# Patient Record
Sex: Male | Born: 1945 | Race: Black or African American | Hispanic: No | Smoking: Former smoker
Health system: Southern US, Community
[De-identification: ages and names within clinical notes are randomized; demographics above are authoritative.]

## PROBLEM LIST (undated history)

## (undated) DIAGNOSIS — E785 Hyperlipidemia, unspecified: Secondary | ICD-10-CM

## (undated) DIAGNOSIS — C61 Malignant neoplasm of prostate: Secondary | ICD-10-CM

## (undated) DIAGNOSIS — G2581 Restless legs syndrome: Secondary | ICD-10-CM

## (undated) DIAGNOSIS — I10 Essential (primary) hypertension: Secondary | ICD-10-CM

## (undated) HISTORY — DX: Malignant neoplasm of prostate: C61

## (undated) HISTORY — DX: Hyperlipidemia, unspecified: E78.5

## (undated) HISTORY — DX: Essential (primary) hypertension: I10

## (undated) HISTORY — DX: Restless legs syndrome: G25.81

## (undated) HISTORY — PX: PELVIC LYMPH NODE DISSECTION: SHX6543

---

## 1998-03-05 ENCOUNTER — Ambulatory Visit (HOSPITAL_COMMUNITY): Admission: RE | Admit: 1998-03-05 | Discharge: 1998-03-05 | Payer: Self-pay | Admitting: Nephrology

## 1998-05-02 ENCOUNTER — Encounter: Payer: Self-pay | Admitting: Emergency Medicine

## 1998-05-02 ENCOUNTER — Emergency Department (HOSPITAL_COMMUNITY): Admission: EM | Admit: 1998-05-02 | Discharge: 1998-05-02 | Payer: Self-pay | Admitting: Emergency Medicine

## 2000-02-21 ENCOUNTER — Other Ambulatory Visit: Admission: RE | Admit: 2000-02-21 | Discharge: 2000-02-21 | Payer: Self-pay | Admitting: Urology

## 2000-02-21 ENCOUNTER — Encounter (INDEPENDENT_AMBULATORY_CARE_PROVIDER_SITE_OTHER): Payer: Self-pay

## 2000-03-06 ENCOUNTER — Encounter: Payer: Self-pay | Admitting: Urology

## 2000-03-06 ENCOUNTER — Encounter: Admission: RE | Admit: 2000-03-06 | Discharge: 2000-03-06 | Payer: Self-pay | Admitting: Urology

## 2000-04-03 ENCOUNTER — Encounter: Payer: Self-pay | Admitting: Urology

## 2000-04-07 ENCOUNTER — Encounter (INDEPENDENT_AMBULATORY_CARE_PROVIDER_SITE_OTHER): Payer: Self-pay

## 2000-04-07 ENCOUNTER — Inpatient Hospital Stay (HOSPITAL_COMMUNITY): Admission: RE | Admit: 2000-04-07 | Discharge: 2000-04-09 | Payer: Self-pay | Admitting: Urology

## 2005-06-24 ENCOUNTER — Ambulatory Visit (HOSPITAL_COMMUNITY): Admission: RE | Admit: 2005-06-24 | Discharge: 2005-06-24 | Payer: Self-pay | Admitting: Urology

## 2005-10-22 ENCOUNTER — Encounter: Payer: Self-pay | Admitting: Nephrology

## 2005-11-06 ENCOUNTER — Encounter (HOSPITAL_COMMUNITY): Admission: RE | Admit: 2005-11-06 | Discharge: 2006-02-04 | Payer: Self-pay | Admitting: Urology

## 2006-07-10 ENCOUNTER — Ambulatory Visit (HOSPITAL_COMMUNITY): Admission: RE | Admit: 2006-07-10 | Discharge: 2006-07-10 | Payer: Self-pay | Admitting: Urology

## 2008-03-30 ENCOUNTER — Ambulatory Visit (HOSPITAL_COMMUNITY): Admission: RE | Admit: 2008-03-30 | Discharge: 2008-03-30 | Payer: Self-pay | Admitting: Urology

## 2010-11-08 NOTE — H&P (Signed)
[facility]  Patient:    [name] Mathews, [name] Mathews                        MRN: [id number] Adm. Date:  [id number] Attending:  Lindaann Slough                         History and Physical  CHIEF COMPLAINT:  Carcinoma of prostate.  HISTORY OF PRESENT ILLNESS:  Patient is a 65 year old male who was found by his family physician to have a PSA at 46.49.  Rectal examination showed a nodule on the left lobe of the prostate at the apex.  A biopsy of the prostate is positive for adenocarcinoma, Gleason score 9.  Bone scan is negative for metastatic disease.  Treatment options were discussed with the patient: Radiation therapy versus radical prostatectomy.  It was felt that a better chance of cure would be with the radical prostatectomy and the patient has chosen to have surgery.  PAST MEDICAL HISTORY:  Patient has a history of kidney stones.  He denies hypertension or diabetes.  FAMILY HISTORY:  His mother died at age 18 of a heart attack.  His father is 6 and has high blood pressure.  Patient has two brothers living and two brothers died, one in [place] and one died of unknown causes to him.  He also has two sisters living.  One sister died of cerebral aneurysm.  He has two children and he is a widower; his wife died of breast cancer at the age of 85.  ALLERGIES:  No known drug allergies.  MEDICATIONS:  None.  REVIEW OF SYSTEMS:  No cough.  No shortness of breath.  No hemoptysis. CARDIOVASCULAR:  No palpitations or chest pain.  GI:  No nausea, no vomiting, no diarrhea or constipation.  GU:  As per history.  PHYSICAL EXAMINATION  GENERAL:  This is a well-developed 65 year old male in no acute distress.  VITAL SIGNS:  His blood pressure is 146/94, pulse 57, respirations 14, temperature 97.7.  HEENT:  His head is normal.  Pupils are equal and reactive to light and accommodation.  Ears, nose and throat within normal limits.  NECK:  Supple.  No cervical lymph nodes  or thyromegaly.  CHEST:  Symmetrical.  LUNGS:  Fully expanded and clear to percussion and auscultation.  HEART:  Regular rhythm.  No murmurs.  No gallops.  ABDOMEN:  Soft, nondistended, nontender.  Liver, spleen and kidneys not palpable. No organomegaly.  Bowel sounds normal.  GENITALIA:  Penis is uncircumcised.  The meatus is normal.  The scrotum is unremarkable.  The testicles, cords and epididymes are all normal.  RECTAL:  The sphincter tone is normal.  The prostate is enlarged and there is a nodule at the left apex.  ADMITTING DIAGNOSIS:  Adenocarcinoma of prostate. DD:  04/07/00 TD:  04/07/00 Job: [id number]

## 2010-11-08 NOTE — Op Note (Signed)
[facility]  Patient:    [name] Mathews, [name] Mathews                        MRN: [id number] Proc. Date: 04/07/00 Adm. Date:  [id number] Attending:  Lindaann Slough CC:         Jarome Matin, M.D.  Jamison Neighbor, M.D.   Operative Report  PREOPERATIVE DIAGNOSIS:  Adenocarcinoma of prostate.  POSTOPERATIVE DIAGNOSIS:  Adenocarcinoma of prostate.  PROCEDURE:  Bilateral pelvic lymphadenectomy.  SURGEON:  Lindaann Slough, M.D.  ASSISTANT:  Jamison Neighbor, M.D.  ANESTHESIA:  General.  INDICATIONS:  The patient is a 65 year old male who was diagnosed to have adenocarcinoma of prostate by biopsy.  His Gleason score if 9.  His PSA is 46.49.  Treatment options were discussed with the patient: radical prostatectomy versus radiation therapy.  He chose to have radical prostatectomy.  He understands that his prognosis depends on the stage, PSA, and Gleason score.  He is willing to proceed with the procedure.  She is scheduled for bilateral pelvic lymphadenectomy and radical prostatectomy.  DESCRIPTION OF PROCEDURE:  Under general anesthesia, the patient was prepped and draped and placed in the supine position.  A longitudinal incision was made in the suprapubic area.  The incision was carried down to the rectus fascia which was then incised.  The recti muscles were split in the midline, and both iliac fossae were entered.  Bilateral pelvic lymphadenectomy was done using the pelvic side walls, the obturator nerve, bilateral side of the bladder, the iliac vessels as landmarks.  Both right and left obturator nodes were sent for frozen section.  While waiting for the pathology report, the endopelvic fascia was incised from the apex to the base of the prostate, and the dorsal vein complex was doubly ligated with #0 Vicryl.  The pathology report on the frozen section showed there were no positive nodes on the right side but 2 out of 3 nodes on the left side were  positive.  After discussion with him and family and explaining all the risks of the radical prostatectomy versus hormonal manipulation and, even with radical prostatectomy, it is not possible to cure the patient, it was then decided not to do the radical prostatectomy.  Hemostasis was then completed with electrocautery.  The fascia was then closed with #0 PDS, and the skin was closed with skin staples.  Needle, sponge, and instrument counts were correct on two occasions.  Estimated blood loss minimal.  The patient tolerated the procedure well and left the OR in satisfactory condition to postanesthesia care unit. DD:  04/07/00 TD:  04/07/00 Job: [id number]

## 2010-11-08 NOTE — Discharge Summary (Signed)
[facility]  Patient:    [name] Mathews, [name] Mathews                        MRN: [id number] Adm. Date:  [id number] Disc. Date: 04/09/00 Attending:  Lindaann Slough CC:         Jarome Matin, M.D.   Discharge Summary  DISCHARGE DIAGNOSES:  Adenocarcinoma of the prostate.  PROCEDURE:  Bilateral pelvic lymphadenectomy on April 07, 2000.  The patient is a 65 year old male who was found by his family physician to have a PSA of 46.49. Rectal examination showed a nodule in the left lobe of the prostate and the apex. A biopsy of the prostate is positive for adenocarcinoma, Gleason score 9. Bone scan is negative for metastatic disease. The patient was admitted on April 07, 2000 for bilateral pelvic lymphadenectomy and radical prostatectomy. A frozen section of the pelvic nodes was positive for metastatic adenocarcinoma. Therefore a radical prostatectomy was not done. The patient was admitted after the procedure.  PHYSICAL EXAMINATION:  VITAL SIGNS:  Blood pressure 146/94, pulse 57, respirations 14, temperature 97.7.  HEENT:  His head is normal. Pupils are equal and reactive to light and accommodatingly. Ears, nose and throat within normal limits.  NECK:  Supple. No cervical lymph nodes and no thyromegaly.  CHEST:  Symmetrical.  LUNGS:  Fully expanded and clear to auscultation and percussion.  HEART:  Regular rhythm, no murmurs, rubs or gallops.  ABDOMEN:  Soft, nondistended, nontender. Liver, spleen and kidneys not palpable. No organomegaly. Bowel sounds are normal.  GENITALIA:  Penis is uncircumcised. The meatus is normal. The scrotum is unremarkable. Testicles, cords and epididymis are all normal.  RECTAL:  The sphincter tone is normal. Prostate is enlarged with a nodule at the left apex.  LABORATORY DATA:  Hemoglobin on admission is 14.8, hematocrit 43.4 and wbc 6.4. PT and PTT within normal limits. Sodium 146, potassium 4.5, BUN 10, creatinine  1.1, calcium is elevated at 10.8. Urinalysis is normal and urine is sterile.  Chest x-ray showed mild right lower lobe atelectasis. His EKG is normal.  The patient had a bilateral pelvic lymphadenectomy done on April 07, 2000. Two out of four nodes were positive. It was then decided not to proceed with radical prostatectomy. The patients postop course was uneventful. He remained afebrile. The Foley catheter was draining clear urine. He was started on a liquid diet which he tolerated well. His diet was gradually advanced. The Foley catheter was removed on April 09, 2000. After removing the Foley, he was voiding well. His urine was clear. His abdomen is soft, nondistended, nontender. He was then discharged home on Cipro 250 mg twice a day and Percocet 5/325 one or two tablets q. 4h p.r.n. for pain.  CONDITION ON DISCHARGE:  Improved.  DISCHARGE DIET:  Regular.  DISCHARGE INSTRUCTIONS:  The patient is instructed not to do any lifting, straining, or driving until further notice. The patient will be treated as an outpatient with Lupron. DD:  04/09/00 TD:  04/09/00 Job: [id number]

## 2011-07-14 DIAGNOSIS — C61 Malignant neoplasm of prostate: Secondary | ICD-10-CM | POA: Diagnosis not present

## 2011-07-22 DIAGNOSIS — E669 Obesity, unspecified: Secondary | ICD-10-CM | POA: Diagnosis not present

## 2011-07-22 DIAGNOSIS — I10 Essential (primary) hypertension: Secondary | ICD-10-CM | POA: Diagnosis not present

## 2011-07-22 DIAGNOSIS — C61 Malignant neoplasm of prostate: Secondary | ICD-10-CM | POA: Diagnosis not present

## 2011-07-22 DIAGNOSIS — E7800 Pure hypercholesterolemia, unspecified: Secondary | ICD-10-CM | POA: Diagnosis not present

## 2011-08-14 DIAGNOSIS — C61 Malignant neoplasm of prostate: Secondary | ICD-10-CM | POA: Diagnosis not present

## 2011-08-15 ENCOUNTER — Other Ambulatory Visit (HOSPITAL_COMMUNITY): Payer: Self-pay | Admitting: Urology

## 2011-08-15 DIAGNOSIS — C61 Malignant neoplasm of prostate: Secondary | ICD-10-CM

## 2011-09-08 ENCOUNTER — Encounter (HOSPITAL_COMMUNITY)
Admission: RE | Admit: 2011-09-08 | Discharge: 2011-09-08 | Disposition: A | Discharge status: Home / Self Care | Payer: Medicare Other | Source: Ambulatory Visit | Attending: Urology | Admitting: Urology

## 2011-09-08 DIAGNOSIS — M545 Low back pain, unspecified: Secondary | ICD-10-CM | POA: Diagnosis not present

## 2011-09-08 DIAGNOSIS — C7951 Secondary malignant neoplasm of bone: Secondary | ICD-10-CM | POA: Insufficient documentation

## 2011-09-08 DIAGNOSIS — C7952 Secondary malignant neoplasm of bone marrow: Secondary | ICD-10-CM | POA: Insufficient documentation

## 2011-09-08 DIAGNOSIS — N2889 Other specified disorders of kidney and ureter: Secondary | ICD-10-CM | POA: Diagnosis not present

## 2011-09-08 DIAGNOSIS — C61 Malignant neoplasm of prostate: Secondary | ICD-10-CM | POA: Insufficient documentation

## 2011-09-08 DIAGNOSIS — R972 Elevated prostate specific antigen [PSA]: Secondary | ICD-10-CM | POA: Diagnosis not present

## 2011-09-08 MED ORDER — TECHNETIUM TC 99M MEDRONATE IV KIT
25.0000 | PACK | Freq: Once | INTRAVENOUS | Status: AC | PRN
  Administered 2011-09-08: 25 via INTRAVENOUS

## 2011-09-15 DIAGNOSIS — C61 Malignant neoplasm of prostate: Secondary | ICD-10-CM | POA: Diagnosis not present

## 2011-09-15 DIAGNOSIS — N529 Male erectile dysfunction, unspecified: Secondary | ICD-10-CM | POA: Diagnosis not present

## 2011-09-17 DIAGNOSIS — G2589 Other specified extrapyramidal and movement disorders: Secondary | ICD-10-CM | POA: Diagnosis not present

## 2011-09-17 DIAGNOSIS — G2581 Restless legs syndrome: Secondary | ICD-10-CM | POA: Diagnosis not present

## 2011-09-17 DIAGNOSIS — G609 Hereditary and idiopathic neuropathy, unspecified: Secondary | ICD-10-CM | POA: Diagnosis not present

## 2011-09-17 DIAGNOSIS — IMO0002 Reserved for concepts with insufficient information to code with codable children: Secondary | ICD-10-CM | POA: Diagnosis not present

## 2011-09-18 ENCOUNTER — Telehealth: Payer: Self-pay | Admitting: Oncology

## 2011-09-18 NOTE — Telephone Encounter (Signed)
S/w pt re appt for 4/5 @ 1:30 pm with FS.

## 2011-09-19 ENCOUNTER — Telehealth: Payer: Self-pay | Admitting: Oncology

## 2011-09-19 NOTE — Telephone Encounter (Signed)
Referred by Dr. Nesi Dx- Prostate Ca °

## 2011-09-21 ENCOUNTER — Other Ambulatory Visit: Payer: Self-pay | Admitting: Oncology

## 2011-09-21 DIAGNOSIS — C61 Malignant neoplasm of prostate: Secondary | ICD-10-CM

## 2011-09-26 ENCOUNTER — Encounter: Payer: Self-pay | Admitting: Oncology

## 2011-09-26 ENCOUNTER — Telehealth: Payer: Self-pay | Admitting: *Deleted

## 2011-09-26 ENCOUNTER — Other Ambulatory Visit (HOSPITAL_BASED_OUTPATIENT_CLINIC_OR_DEPARTMENT_OTHER): Payer: Medicare Other | Admitting: Lab

## 2011-09-26 ENCOUNTER — Ambulatory Visit (HOSPITAL_BASED_OUTPATIENT_CLINIC_OR_DEPARTMENT_OTHER): Payer: Medicare Other | Admitting: Oncology

## 2011-09-26 ENCOUNTER — Ambulatory Visit: Payer: Medicare Other

## 2011-09-26 VITALS — BP 144/84 | HR 64 | Temp 98.1°F | Ht 62.5 in | Wt 178.0 lb

## 2011-09-26 DIAGNOSIS — C61 Malignant neoplasm of prostate: Secondary | ICD-10-CM

## 2011-09-26 DIAGNOSIS — C7951 Secondary malignant neoplasm of bone: Secondary | ICD-10-CM | POA: Diagnosis not present

## 2011-09-26 LAB — CBC WITH DIFFERENTIAL/PLATELET
EOS%: 0.7 % (ref 0.0–7.0)
Eosinophils Absolute: 0.1 10*3/uL (ref 0.0–0.5)
LYMPH%: 30.9 % (ref 14.0–49.0)
MONO#: 0.7 10*3/uL (ref 0.1–0.9)
WBC: 9.3 10*3/uL (ref 4.0–10.3)

## 2011-09-26 LAB — COMPREHENSIVE METABOLIC PANEL
Albumin: 4 g/dL (ref 3.5–5.2)
BUN: 12 mg/dL (ref 6–23)
Calcium: 9.7 mg/dL (ref 8.4–10.5)
Chloride: 106 mEq/L (ref 96–112)
Glucose, Bld: 105 mg/dL — ABNORMAL HIGH (ref 70–99)
Total Bilirubin: 0.3 mg/dL (ref 0.3–1.2)

## 2011-09-26 MED ORDER — PREDNISONE 5 MG PO TABS: 5.0000 mg | ORAL_TABLET | Freq: Two times a day (BID) | ORAL | Status: AC

## 2011-09-26 MED ORDER — ABIRATERONE ACETATE 250 MG PO TABS: 1000.0000 mg | ORAL_TABLET | Freq: Every day | ORAL | Status: DC

## 2011-09-26 NOTE — Progress Notes (Signed)
CC:   Lindaann Slough, M.D.  REASON FOR CONSULTATION:  Prostate cancer.  HISTORY OF PRESENT ILLNESS:  [name] Mathews is a pleasant gentleman of [place], [place], who has lived in [place] for a while but he is currently back in [place].  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  He is a relatively healthy gentleman without any significant history of diabetes or coronary disease.  He does have history of hypertension that has been reasonably controlled.  His history of prostate cancer dates back to 2001 where he had presented with an abnormal digital rectal examination, and his evaluation included a prostate biopsy that was done in August of 2001 and showed that he had adenocarcinoma of the prostate, Gleason score 4 plus 5 equals 9.  That is case number [id number].  He underwent an attempted prostatectomy and found to have a left pelvic enlargement that was biopsy proven to be metastatic adenocarcinoma involving 2-3 left pelvic lymph nodes.  He was started on hormone therapy, initially with Lupron and subsequently Casodex was added in 2009, and he was switched to [place] at one point.  However, most recently he was noted to start having increase in his PSA despite near castrate levels of testosterone.  His last PSA was up to 16, previously was 10.9 in October of 2012 with a testosterone of 33.  Previous to that in June of 2012 his PSA was 7.5, indicating a doubling time slightly over 6 months.  As mentioned, he had been recently restarted on Lupron after he had been on Firmagon previously.  His recent bone scan done 09/08/2011 showed that there was new multifocal osseous activity consistent with metastatic disease within the skull, occipital right parietal regions.  There is a lesion in the right scapular glenoid.  Several rib lesions.  Spinal activity is more  pronounced on the thoracic spine.  Probable metastasis in the right pubic ramus.  With all of that the patient remained asymptomatic actually.  He had not had any back pain, not had any shoulder pain, had not had any major changes in his performance status. He has continued to have excellent quality of life.  He has continued to golf regularly and really had not had any major changes in his performance status and not had any constitutional symptoms.  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  REVIEW OF SYSTEMS:  Not reporting headaches, blurry vision, double vision.  Not reporting any motor or sensory neuropathy.  Not reporting any alteration in mental status.  Not reporting any psychiatric issues, depression.  Not reporting any fever, chills, sweats.  Not reporting any cough, hemoptysis, hematemesis.  No nausea, vomiting, abdominal pain. No hematochezia or melena.  Rest of review of systems unremarkable.  PAST MEDICAL HISTORY:  Significant for hypertension, history of hyperlipidemia, restless legs syndrome and history of prostate cancer.  MEDICATIONS:  He is on Requip, Lipitor, Casodex, aspirin, VESIcare, Fish Oil.  He is on Lupron injections as mentioned.  ALLERGIES:  Percocet.  SOCIAL HISTORY:  He is married for the second time.  He has number 2 children.  Denied any alcohol or tobacco abuse.  FAMILY HISTORY:  Really unremarkable for any prostate cancer.  PHYSICAL EXAMINATION:  General:  Alert, awake gentleman, appeared in no active distress today.  Vital signs:  His blood pressure is 144/84, pulse 64, respirations 20, temperature 98.  Weight 178 pounds.  ECOG performance status is 1.  HEENT:  Head normocephalic, atraumatic. Pupils equal, round, reactive to light.  Oral mucosa moist and pink. Neck:  Supple without adenopathy.  Heart:  Regular rate, S1, S2.  Lungs: Clear to auscultation.  Abdomen:  Soft, nontender.   No hepatosplenomegaly.  Extremities:  No edema.  Neurological:  Intact motor, sensory and deep tendon reflexes.  LABORATORY DATA:  Showed a hemoglobin of 12.7, white count 9.3, platelet count of 247.  ASSESSMENT AND PLAN:  This is a pleasant 66 year old gentleman with the following issues: 1. Advanced prostate cancer.  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     He has an elevated PSA up to 16 in March of 2013 with his most     recent one was 10.95 in October of 2012 as mentioned, with a     doubling time of just about over 6 months.  He has, as I mentioned,     close to castrate level testosterone of 33.  I had a lengthy     discussion today with [name] and his family discussing the     natural course of prostate cancer, more specifically castration     resistant disease and the treatment options were outlined today.  I     talked to him about hormone manipulation with ketoconazole and     prednisone versus more active agent such as Zytiga.  Also talked     about prevention immunotherapy as well as systemic chemotherapy.     At this time, [name] is interested in using Zytiga.  The risks     and benefits of this drug were discussed in detail with toxicity to     include adrenal insufficiency, electrolyte imbalance, liver     function abnormalities, fluid retention, lower extremity edema, was     discussed today in detail.  Also talked to him about using     prednisone with it at 5 mg twice a day.  Complications associated     with hyperglycemia, weight gain, fluid retention, etc. 2. Bone directed therapy.  I talked to him extensively about use of     Xgeva for bony protective purposes as well as the role of the     calcium supplementation.  Also talked to him about complications     associated with that which would include osteonecrosis of the jaw,     hypocalcemia especially in the setting of renal  insufficiency which     I do not think he has at this time, but I am checking both calcium     levels and creatinine levels.  I have also instructed him to     stopped his Casodex to give him antiandrogen withdrawal effect     which I think it will help his cause as well.  All of his questions     were answered today.    ______________________________ Benjiman Core, M.D. FNS/MEDQ  D:  09/26/2011  T:  09/26/2011  Job:  [id number]

## 2011-09-26 NOTE — Progress Notes (Signed)
ZYTIGA RX TO [facility]. 

## 2011-09-26 NOTE — Progress Notes (Signed)
Note dictated

## 2011-09-26 NOTE — Telephone Encounter (Signed)
gve the pt his may 2013 appt calendar °

## 2011-09-29 ENCOUNTER — Telehealth: Payer: Self-pay | Admitting: Oncology

## 2011-10-27 ENCOUNTER — Other Ambulatory Visit: Payer: Self-pay | Admitting: *Deleted

## 2011-10-27 NOTE — Telephone Encounter (Signed)
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 

## 2011-10-28 ENCOUNTER — Other Ambulatory Visit: Payer: Self-pay | Admitting: *Deleted

## 2011-10-28 MED ORDER — ABIRATERONE ACETATE 250 MG PO TABS: 1000.0000 mg | ORAL_TABLET | Freq: Every day | ORAL | Status: DC

## 2011-10-28 NOTE — Telephone Encounter (Signed)
RECEIVED A FAX FROM [facility] CONCERNING A CONFIRMATION OF FACSIMILE RECEIPT. 

## 2011-10-30 ENCOUNTER — Other Ambulatory Visit: Payer: Self-pay | Admitting: *Deleted

## 2011-10-30 NOTE — Telephone Encounter (Signed)
Confirmation that med was shipped on 10/29/11.

## 2011-10-31 ENCOUNTER — Ambulatory Visit (HOSPITAL_BASED_OUTPATIENT_CLINIC_OR_DEPARTMENT_OTHER): Payer: Medicare Other | Admitting: Oncology

## 2011-10-31 ENCOUNTER — Other Ambulatory Visit (HOSPITAL_BASED_OUTPATIENT_CLINIC_OR_DEPARTMENT_OTHER): Payer: Medicare Other | Admitting: Lab

## 2011-10-31 VITALS — BP 111/71 | HR 54 | Temp 97.4°F | Ht 62.5 in | Wt 173.2 lb

## 2011-10-31 DIAGNOSIS — C7952 Secondary malignant neoplasm of bone marrow: Secondary | ICD-10-CM | POA: Diagnosis not present

## 2011-10-31 DIAGNOSIS — C775 Secondary and unspecified malignant neoplasm of intrapelvic lymph nodes: Secondary | ICD-10-CM

## 2011-10-31 DIAGNOSIS — C61 Malignant neoplasm of prostate: Secondary | ICD-10-CM

## 2011-10-31 DIAGNOSIS — D649 Anemia, unspecified: Secondary | ICD-10-CM

## 2011-10-31 DIAGNOSIS — C7951 Secondary malignant neoplasm of bone: Secondary | ICD-10-CM

## 2011-10-31 LAB — CBC WITH DIFFERENTIAL/PLATELET
EOS%: 0.4 % (ref 0.0–7.0)
Eosinophils Absolute: 0.1 10*3/uL (ref 0.0–0.5)
HGB: 12.8 g/dL — ABNORMAL LOW (ref 13.0–17.1)
MCHC: 32 g/dL (ref 32.0–36.0)
MCV: 84.2 fL (ref 79.3–98.0)
MONO%: 5.1 % (ref 0.0–14.0)
NEUT%: 81.3 % — ABNORMAL HIGH (ref 39.0–75.0)
RBC: 4.75 10*6/uL (ref 4.20–5.82)
WBC: 13.5 10*3/uL — ABNORMAL HIGH (ref 4.0–10.3)
lymph#: 1.7 10*3/uL (ref 0.9–3.3)
nRBC: 0 % (ref 0–0)

## 2011-10-31 LAB — COMPREHENSIVE METABOLIC PANEL
AST: 30 U/L (ref 0–37)
CO2: 32 mEq/L (ref 19–32)
Potassium: 4.8 mEq/L (ref 3.5–5.3)
Sodium: 143 mEq/L (ref 135–145)
Total Bilirubin: 0.6 mg/dL (ref 0.3–1.2)

## 2011-10-31 LAB — PSA: PSA: 22.86 ng/mL — ABNORMAL HIGH (ref <=4.00)

## 2011-10-31 MED ORDER — DENOSUMAB 120 MG/1.7ML ~~LOC~~ SOLN
120.0000 mg | Freq: Once | SUBCUTANEOUS | Status: AC
  Administered 2011-10-31: 120 mg via SUBCUTANEOUS
  Filled 2011-10-31: qty 1.7

## 2011-10-31 NOTE — Progress Notes (Signed)
Hematology and Oncology Follow Up Visit  [name] Mathews [id number] [birth year] 66 y.o. 10/31/2011 3:17 PM    Principle Diagnosis: This is a pleasant 66 year old gentleman with  Advanced prostate cancer. He has metastatic disease to the bone. He was inially diagnosed  2001, gleason score 4+5= 9.  Prior Therapy: 1. 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 He was started on hormone therapy, initially with Lupron   2. Due to  A rise in his PSA Casodex was added to firmagon in 2009.  3. Most recently, his PSA was up to 16, with castrate level testosterone and bone involvement based on a bone scan on 08/2101.  Current therapy:  Zytiga 1000 mg po daily started in 09/2011. Xgeva 120 mg to start on 10/31/2011. Lupron given by Dr. Brunilda Payor.   Interim History: 66 year old man presents today for a follow up visit. He started Zytiga last month and has tolerated well. He reports no symptoms at this point. He reports no bone pain, no GU symptoms. He report no Gi symptoms. He has no fluid retention or leg swelling.  had not had any back pain, not had any shoulder pain, had not had any major changes in his performance status. He has continued to have excellent quality of life. He has continued to golf regularly and really had not had any major changes in his performance status and not had any constitutional symptoms.   Medications: I have reviewed the patient's current medications. Current outpatient prescriptions:abiraterone Acetate (ZYTIGA) 250 MG tablet, Take 4 tablets (1,000 mg total) by mouth daily. Take on an empty stomach 1 hour before or 2 hours after a meal, Disp: 120 tablet, Rfl: 1;  aspirin 81 MG tablet, Take 81 mg by mouth daily., Disp: , Rfl: ;  atorvastatin (LIPITOR) 10 MG tablet, Take 10 mg by mouth daily., Disp: , Rfl: ;  bicalutamide (CASODEX) 50 MG tablet,  Take 50 mg by mouth daily., Disp: , Rfl:  fish oil-omega-3 fatty acids 1000 MG capsule, Take 1 g by mouth daily., Disp: , Rfl: ;  rOPINIRole (REQUIP) 2 MG tablet, Take 2 mg by mouth at bedtime., Disp: , Rfl: ;  solifenacin (VESICARE) 5 MG tablet, Take 10 mg by mouth daily., Disp: , Rfl:  Current facility-administered medications:denosumab (XGEVA) injection 120 mg, 120 mg, Subcutaneous, Once, Benjiman Core, MD, 120 mg at 10/31/11 1423  Allergies: No Known Allergies  Past Medical History, Surgical history, Social history, and Family History were reviewed and updated.  Review of Systems: Constitutional:  Negative for fever, chills, night sweats, anorexia, weight loss, pain. Cardiovascular: negative Respiratory: negative Neurological: negative Dermatological: negative ENT: negative Skin: Negative. Gastrointestinal: negative Genito-Urinary: negative Hematological and Lymphatic: negative Breast: negative Musculoskeletal: negative Remaining ROS negative. Physical Exam: Blood pressure 111/71, pulse 54, temperature 97.4 F (36.3 C), temperature source Oral, height 5' 2.5" (1.588 m), weight 173 lb 3.2 oz (78.563 kg). ECOG: 0 General appearance: alert Head: Normocephalic, without obvious abnormality, atraumatic Neck: no adenopathy, no carotid bruit, no JVD, supple, symmetrical, trachea midline and thyroid not enlarged, symmetric, no tenderness/mass/nodules Lymph nodes: Cervical, supraclavicular, and axillary nodes normal. Heart:regular rate and rhythm, S1, S2 normal, no murmur, click, rub or gallop Lung:chest clear, no wheezing, rales, normal symmetric air entry Abdomin: soft, non-tender, without masses or organomegaly EXT:no erythema, induration, or nodules   Lab Results: Lab Results  Component Value  Date   WBC 13.5* 10/31/2011   HGB 12.8* 10/31/2011   HCT 40.0 10/31/2011   MCV 84.2 10/31/2011   PLT 221 10/31/2011     Chemistry      Component Value Date/Time   NA 143 09/26/2011 1316     K 4.5 09/26/2011 1316   CL 106 09/26/2011 1316   CO2 28 09/26/2011 1316   BUN 12 09/26/2011 1316   CREATININE 0.85 09/26/2011 1316      Component Value Date/Time   CALCIUM 9.7 09/26/2011 1316   ALKPHOS 121* 09/26/2011 1316   AST 21 09/26/2011 1316   ALT 10 09/26/2011 1316   BILITOT 0.3 09/26/2011 1316       Impression and Plan: This is a pleasant 66 year old gentleman with the  following issues:  1. 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 He has an elevated PSA up to 16 in March of 2013. After one month of Zytiga, he is doing well. His PSA is pending. The plan is to continue with therapy with the same dose or schedule.   2. Bone directed therapy. He is to start Xgeva for bony protective purposes withcalcium supplementation. Also talked to him about complications associated with that which would include osteonecrosis of the jaw and I continued to educated him about dental hygiene.   3. Androgen deprivation: He is to continue Lupron under the care of Dr. Brunilda Payor.     Eli Hose, MD 5/10/20133:17 PM

## 2011-11-03 ENCOUNTER — Telehealth: Payer: Self-pay | Admitting: Oncology

## 2011-11-03 NOTE — Telephone Encounter (Signed)
S/w pt confirming appt for 6/11. Per last order sent 5/10 - appt w/ml 6/11

## 2011-11-28 ENCOUNTER — Encounter: Payer: Self-pay | Admitting: *Deleted

## 2011-11-28 NOTE — Progress Notes (Signed)
RECEIVED A FAX FROM [facility] CONCERNING A CONFIRMATION OF PRESCRIPTION SHIPMENT. 

## 2011-12-02 ENCOUNTER — Other Ambulatory Visit: Payer: Medicare Other | Admitting: Lab

## 2011-12-02 ENCOUNTER — Encounter: Payer: Medicare Other | Admitting: Oncology

## 2011-12-02 ENCOUNTER — Telehealth: Payer: Self-pay | Admitting: Oncology

## 2011-12-02 NOTE — Telephone Encounter (Signed)
called pt to schedule appts fro today earlier and pt was not able to come. r/s appts to 06/17

## 2011-12-03 NOTE — Progress Notes (Signed)
This encounter was created in error - please disregard.

## 2011-12-08 ENCOUNTER — Telehealth: Payer: Self-pay | Admitting: Oncology

## 2011-12-08 ENCOUNTER — Other Ambulatory Visit (HOSPITAL_BASED_OUTPATIENT_CLINIC_OR_DEPARTMENT_OTHER): Payer: Medicare Other | Admitting: Lab

## 2011-12-08 ENCOUNTER — Ambulatory Visit (HOSPITAL_BASED_OUTPATIENT_CLINIC_OR_DEPARTMENT_OTHER): Payer: Medicare Other | Admitting: Oncology

## 2011-12-08 VITALS — BP 141/84 | HR 49 | Temp 97.7°F | Ht 62.5 in | Wt 178.0 lb

## 2011-12-08 DIAGNOSIS — C7951 Secondary malignant neoplasm of bone: Secondary | ICD-10-CM

## 2011-12-08 DIAGNOSIS — C61 Malignant neoplasm of prostate: Secondary | ICD-10-CM

## 2011-12-08 DIAGNOSIS — C7952 Secondary malignant neoplasm of bone marrow: Secondary | ICD-10-CM

## 2011-12-08 DIAGNOSIS — D649 Anemia, unspecified: Secondary | ICD-10-CM

## 2011-12-08 DIAGNOSIS — E291 Testicular hypofunction: Secondary | ICD-10-CM

## 2011-12-08 LAB — CBC WITH DIFFERENTIAL/PLATELET
Basophils Absolute: 0 10*3/uL (ref 0.0–0.1)
HCT: 35.8 % — ABNORMAL LOW (ref 38.4–49.9)
MCH: 27.1 pg — ABNORMAL LOW (ref 27.2–33.4)
MCHC: 32.5 g/dL (ref 32.0–36.0)
MONO#: 0.8 10*3/uL (ref 0.1–0.9)
MONO%: 6.7 % (ref 0.0–14.0)
NEUT%: 73.4 % (ref 39.0–75.0)
Platelets: 231 10*3/uL (ref 140–400)
WBC: 12.3 10*3/uL — ABNORMAL HIGH (ref 4.0–10.3)

## 2011-12-08 LAB — COMPREHENSIVE METABOLIC PANEL WITH GFR
ALT: 14 U/L (ref 0–53)
AST: 16 U/L (ref 0–37)
Albumin: 3.6 g/dL (ref 3.5–5.2)
Alkaline Phosphatase: 97 U/L (ref 39–117)
BUN: 9 mg/dL (ref 6–23)
CO2: 30 meq/L (ref 19–32)
Calcium: 8.8 mg/dL (ref 8.4–10.5)
Chloride: 106 meq/L (ref 96–112)
Creatinine, Ser: 0.69 mg/dL (ref 0.50–1.35)
Glucose, Bld: 96 mg/dL (ref 70–99)
Potassium: 3.9 meq/L (ref 3.5–5.3)
Sodium: 142 meq/L (ref 135–145)
Total Bilirubin: 0.4 mg/dL (ref 0.3–1.2)
Total Protein: 5.9 g/dL — ABNORMAL LOW (ref 6.0–8.3)

## 2011-12-08 LAB — PSA: PSA: 6.13 ng/mL — ABNORMAL HIGH

## 2011-12-08 MED ORDER — DENOSUMAB 120 MG/1.7ML ~~LOC~~ SOLN
120.0000 mg | Freq: Once | SUBCUTANEOUS | Status: AC
  Administered 2011-12-08: 120 mg via SUBCUTANEOUS
  Filled 2011-12-08: qty 1.7

## 2011-12-08 NOTE — Progress Notes (Signed)
Hematology and Oncology Follow Up Visit  [name] Mathews [id number] 05-11-1946 66 y.o. 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 He was inially diagnosed  2001, gleason score 4+5= 9.  Prior Therapy: 1. 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 He was started on hormone therapy, initially with Lupron   2. Due to  A rise in his PSA Casodex was added to firmagon in 2009.  3. Most recently, his PSA was up to 16, with castrate level testosterone and bone involvement based on a bone scan on 08/2101.  Current therapy:  Zytiga 1000 mg po daily started in 09/2011. Xgeva 120 mg to start on 10/31/2011. Lupron given by Dr. Brunilda Payor.   Interim History: 66 year old man presents today for a follow up visit. On Zytiga and tolerating this well. He reports no symptoms at this point. He reports no bone pain, no GU symptoms. He report no GI symptoms. He has no fluid retention or leg swelling. He has not had not had any back pain, not had any shoulder pain, had not had any major changes in his performance status. He has continued to have excellent quality of life. He has continued to golf regularly and really had not had any major changes in his performance status and not had any constitutional symptoms.   Medications: I have reviewed the patient's current medications. Current outpatient prescriptions:abiraterone Acetate (ZYTIGA) 250 MG tablet, Take 4 tablets (1,000 mg total) by mouth daily. Take on an empty stomach 1 hour before or 2 hours after a meal, Disp: 120 tablet, Rfl: 1;  aspirin 81 MG tablet, Take 81 mg by mouth daily., Disp: , Rfl: ;  atorvastatin (LIPITOR) 10 MG tablet, Take 10 mg by mouth daily., Disp: , Rfl: ;  bicalutamide (CASODEX) 50 MG tablet, Take 50  mg by mouth daily., Disp: , Rfl:  fish oil-omega-3 fatty acids 1000 MG capsule, Take 1 g by mouth daily., Disp: , Rfl: ;  rOPINIRole (REQUIP) 2 MG tablet, Take 2 mg by mouth at bedtime., Disp: , Rfl: ;  solifenacin (VESICARE) 5 MG tablet, Take 10 mg by mouth daily., Disp: , Rfl:  Current facility-administered medications:denosumab (XGEVA) injection 120 mg, 120 mg, Subcutaneous, Once, Benjiman Core, MD, 120 mg at 12/08/11 1411  Allergies: No Known Allergies  Past Medical History, Surgical history, Social history, and Family History were reviewed and updated.  Review of Systems: Constitutional:  Negative for fever, chills, night sweats, anorexia, weight loss, pain. Cardiovascular: negative Respiratory: negative Neurological: negative Dermatological: negative ENT: negative Skin: Negative. Gastrointestinal: negative Genito-Urinary: negative Hematological and Lymphatic: negative Breast: negative Musculoskeletal: negative Remaining ROS negative.  Physical Exam: Blood pressure 141/84, pulse 49, temperature 97.7 F (36.5 C), temperature source Oral, height 5' 2.5" (1.588 m), weight 178 lb (80.74 kg). ECOG: 0 General appearance: alert Head: Normocephalic, without obvious abnormality, atraumatic Neck: no adenopathy, no carotid bruit, no JVD, supple, symmetrical, trachea midline and thyroid not enlarged, symmetric, no tenderness/mass/nodules Lymph nodes: Cervical, supraclavicular, and axillary nodes normal. Heart:regular rate and rhythm, S1, S2 normal, no murmur, click, rub or gallop Lung:chest clear, no wheezing, rales, normal symmetric air entry Abdomen: soft, non-tender, without masses or organomegaly EXT:no erythema, induration, or nodules  Lab Results: Lab Results  Component Value Date  WBC 12.3* 12/08/2011   HGB 11.6* 12/08/2011   HCT 35.8* 12/08/2011   MCV 83.3 12/08/2011   PLT 231 12/08/2011     Chemistry      Component Value Date/Time   NA 143 10/31/2011 1324   K 4.8  10/31/2011 1324   CL 103 10/31/2011 1324   CO2 32 10/31/2011 1324   BUN 18 10/31/2011 1324   CREATININE 0.98 10/31/2011 1324      Component Value Date/Time   CALCIUM 9.5 10/31/2011 1324   ALKPHOS 148* 10/31/2011 1324   AST 30 10/31/2011 1324   ALT 20 10/31/2011 1324   BILITOT 0.6 10/31/2011 1324     Impression and Plan: This is a pleasant 66 year old gentleman with the  following issues:  1. 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 He has an elevated PSA up to 16 in March of 2013. After one month of Zytiga, he is doing well clinically. PSA up slightly last month. PSA in pending today. The plan is to continue with therapy with the same dose or schedule for now.  2. Bone directed therapy. On Xgeva with calcium supplementation. Also talked to him about complications associated with that which would include osteonecrosis of the jaw and I continued to educated him about dental hygiene.   3. Androgen deprivation: He is to continue Lupron under the care of Dr. Brunilda Payor.   4. Follow-up: In 4-5 weeks.    Clenton Pare 6/17/20132:24 PM

## 2011-12-08 NOTE — Telephone Encounter (Signed)
appts made and printed for pt aom °

## 2011-12-18 ENCOUNTER — Other Ambulatory Visit: Payer: Self-pay | Admitting: *Deleted

## 2011-12-18 DIAGNOSIS — IMO0002 Reserved for concepts with insufficient information to code with codable children: Secondary | ICD-10-CM | POA: Diagnosis not present

## 2011-12-18 DIAGNOSIS — G609 Hereditary and idiopathic neuropathy, unspecified: Secondary | ICD-10-CM | POA: Diagnosis not present

## 2011-12-18 DIAGNOSIS — G2589 Other specified extrapyramidal and movement disorders: Secondary | ICD-10-CM | POA: Diagnosis not present

## 2011-12-18 DIAGNOSIS — G2581 Restless legs syndrome: Secondary | ICD-10-CM | POA: Diagnosis not present

## 2011-12-18 MED ORDER — ABIRATERONE ACETATE 250 MG PO TABS: 1000.0000 mg | ORAL_TABLET | Freq: Every day | ORAL | Status: DC

## 2011-12-18 NOTE — Telephone Encounter (Signed)
Faxed refill script  + 1 [facility]ill [facility] for zytiga. [phone]

## 2011-12-19 DIAGNOSIS — C61 Malignant neoplasm of prostate: Secondary | ICD-10-CM | POA: Diagnosis not present

## 2011-12-19 DIAGNOSIS — E7800 Pure hypercholesterolemia, unspecified: Secondary | ICD-10-CM | POA: Diagnosis not present

## 2011-12-19 DIAGNOSIS — K219 Gastro-esophageal reflux disease without esophagitis: Secondary | ICD-10-CM | POA: Diagnosis not present

## 2011-12-19 DIAGNOSIS — R062 Wheezing: Secondary | ICD-10-CM | POA: Diagnosis not present

## 2011-12-19 DIAGNOSIS — I10 Essential (primary) hypertension: Secondary | ICD-10-CM | POA: Diagnosis not present

## 2011-12-19 DIAGNOSIS — N62 Hypertrophy of breast: Secondary | ICD-10-CM | POA: Diagnosis not present

## 2011-12-22 DIAGNOSIS — E782 Mixed hyperlipidemia: Secondary | ICD-10-CM | POA: Diagnosis not present

## 2011-12-22 DIAGNOSIS — I10 Essential (primary) hypertension: Secondary | ICD-10-CM | POA: Diagnosis not present

## 2011-12-22 DIAGNOSIS — E7800 Pure hypercholesterolemia, unspecified: Secondary | ICD-10-CM | POA: Diagnosis not present

## 2011-12-22 DIAGNOSIS — Z79899 Other long term (current) drug therapy: Secondary | ICD-10-CM | POA: Diagnosis not present

## 2011-12-24 DIAGNOSIS — K219 Gastro-esophageal reflux disease without esophagitis: Secondary | ICD-10-CM | POA: Diagnosis not present

## 2011-12-26 ENCOUNTER — Encounter: Payer: Self-pay | Admitting: *Deleted

## 2011-12-26 NOTE — Progress Notes (Signed)
RECEIVED A FAX FROM [facility] CONCERNING A CONFIRMATION OF PRESCRIPTION SHIPMENT FOR ZYTIGA. 

## 2012-01-12 ENCOUNTER — Ambulatory Visit: Payer: Medicare Other | Admitting: Oncology

## 2012-01-12 ENCOUNTER — Other Ambulatory Visit: Payer: Medicare Other

## 2012-01-13 DIAGNOSIS — D126 Benign neoplasm of colon, unspecified: Secondary | ICD-10-CM | POA: Diagnosis not present

## 2012-01-14 ENCOUNTER — Telehealth: Payer: Self-pay | Admitting: Oncology

## 2012-01-14 ENCOUNTER — Other Ambulatory Visit (HOSPITAL_BASED_OUTPATIENT_CLINIC_OR_DEPARTMENT_OTHER): Payer: Medicare Other | Admitting: Lab

## 2012-01-14 ENCOUNTER — Ambulatory Visit (HOSPITAL_BASED_OUTPATIENT_CLINIC_OR_DEPARTMENT_OTHER): Payer: Medicare Other | Admitting: Oncology

## 2012-01-14 ENCOUNTER — Other Ambulatory Visit: Payer: Self-pay | Admitting: *Deleted

## 2012-01-14 VITALS — BP 160/84 | HR 58 | Temp 99.1°F | Ht 62.5 in | Wt 177.3 lb

## 2012-01-14 DIAGNOSIS — C61 Malignant neoplasm of prostate: Secondary | ICD-10-CM | POA: Diagnosis not present

## 2012-01-14 DIAGNOSIS — C7952 Secondary malignant neoplasm of bone marrow: Secondary | ICD-10-CM | POA: Diagnosis not present

## 2012-01-14 DIAGNOSIS — C7951 Secondary malignant neoplasm of bone: Secondary | ICD-10-CM | POA: Diagnosis not present

## 2012-01-14 LAB — COMPREHENSIVE METABOLIC PANEL
AST: 18 U/L (ref 0–37)
Albumin: 3.7 g/dL (ref 3.5–5.2)
Alkaline Phosphatase: 87 U/L (ref 39–117)
Potassium: 4.4 mEq/L (ref 3.5–5.3)
Sodium: 143 mEq/L (ref 135–145)
Total Protein: 6.2 g/dL (ref 6.0–8.3)

## 2012-01-14 LAB — CBC WITH DIFFERENTIAL/PLATELET
Basophils Absolute: 0 10*3/uL (ref 0.0–0.1)
Eosinophils Absolute: 0.1 10*3/uL (ref 0.0–0.5)
HGB: 13 g/dL (ref 13.0–17.1)
MCH: 28.1 pg (ref 27.2–33.4)
MCHC: 32.9 g/dL (ref 32.0–36.0)
MONO%: 5.7 % (ref 0.0–14.0)
NEUT#: 6.1 10*3/uL (ref 1.5–6.5)
NEUT%: 69.2 % (ref 39.0–75.0)
RBC: 4.65 10*6/uL (ref 4.20–5.82)
lymph#: 2.1 10*3/uL (ref 0.9–3.3)

## 2012-01-14 MED ORDER — SODIUM CHLORIDE 0.9 % IV SOLN: Freq: Once | INTRAVENOUS | Status: DC | PRN

## 2012-01-14 MED ORDER — ABIRATERONE ACETATE 250 MG PO TABS: 1000.0000 mg | ORAL_TABLET | Freq: Every day | ORAL | Status: DC

## 2012-01-14 MED ORDER — DENOSUMAB 120 MG/1.7ML ~~LOC~~ SOLN
120.0000 mg | Freq: Once | SUBCUTANEOUS | Status: AC
  Administered 2012-01-14: 120 mg via SUBCUTANEOUS
  Filled 2012-01-14: qty 1.7

## 2012-01-14 NOTE — Progress Notes (Signed)
Hematology and Oncology Follow Up Visit  ODEAN FESTER [id number] 02/08/1946 66 y.o. 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 He was inially diagnosed  2001, gleason score 4+5= 9.  Prior Therapy: 1. 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 He was started on hormone therapy, initially with Lupron   2. Due to  A rise in his PSA Casodex was added to firmagon in 2009.  3. Most recently, his PSA was up to 16, with castrate level testosterone and bone involvement based on a bone scan on 08/2101.  Current therapy:  Zytiga 1000 mg po daily started in 09/2011 for a PSA of 22.  Xgeva 120 mg to start on 10/31/2011. Repeated every visit.  Lupron given by Dr. Brunilda Payor.   Interim History: 66 year old man presents today for a follow up visit. On Zytiga and tolerating this well. He reports no symptoms at this point. He reports no bone pain, no GU symptoms. He report no GI symptoms. He has no fluid retention or leg swelling. He has not had not had any back pain, not had any shoulder pain, had not had any major changes in his performance status. He has continued to have excellent quality of life. He has continued to golf regularly and really had not had any major changes in his performance status and not had any constitutional symptoms.   Medications: I have reviewed the patient's current medications. Current outpatient prescriptions:abiraterone Acetate (ZYTIGA) 250 MG tablet, Take 4 tablets (1,000 mg total) by mouth daily. Take on an empty stomach 1 hour before or 2 hours after a meal, Disp: 120 tablet, Rfl: 1;  aspirin 81 MG tablet, Take 81 mg by mouth daily., Disp: , Rfl: ;  atorvastatin (LIPITOR) 10 MG tablet, Take 10 mg by mouth daily., Disp: , Rfl: ;   bicalutamide (CASODEX) 50 MG tablet, Take 50 mg by mouth daily., Disp: , Rfl:  fish oil-omega-3 fatty acids 1000 MG capsule, Take 1 g by mouth daily., Disp: , Rfl: ;  rOPINIRole (REQUIP) 2 MG tablet, Take 2 mg by mouth at bedtime., Disp: , Rfl: ;  solifenacin (VESICARE) 5 MG tablet, Take 10 mg by mouth daily., Disp: , Rfl:  Current facility-administered medications:0.9 %  sodium chloride infusion, , Intravenous, Once PRN, Benjiman Core, MD;  denosumab (XGEVA) injection 120 mg, 120 mg, Subcutaneous, Once, Benjiman Core, MD  Allergies: No Known Allergies  Past Medical History, Surgical history, Social history, and Family History were reviewed and updated.  Review of Systems: Constitutional:  Negative for fever, chills, night sweats, anorexia, weight loss, pain. Cardiovascular: negative Respiratory: negative Neurological: negative Dermatological: negative ENT: negative Skin: Negative. Gastrointestinal: negative Genito-Urinary: negative Hematological and Lymphatic: negative Breast: negative Musculoskeletal: negative Remaining ROS negative.  Physical Exam: Blood pressure 160/84, pulse 58, temperature 99.1 F (37.3 C), temperature source Oral, height 5' 2.5" (1.588 m), weight 177 lb 4.8 oz (80.423 kg). ECOG: 0 General appearance: alert Head: Normocephalic, without obvious abnormality, atraumatic Neck: no adenopathy, no carotid bruit, no JVD, supple, symmetrical, trachea midline and thyroid not enlarged, symmetric, no tenderness/mass/nodules Lymph nodes: Cervical, supraclavicular, and axillary nodes normal. Heart:regular rate and rhythm, S1, S2 normal, no murmur, click, rub or gallop Lung:chest clear, no wheezing, rales, normal symmetric air entry Abdomen:  soft, non-tender, without masses or organomegaly EXT:no erythema, induration, or nodules  Lab Results: Lab Results  Component Value Date   WBC 8.7 01/14/2012   HGB 13.0 01/14/2012   HCT 39.6 01/14/2012   MCV 85.2 01/14/2012   PLT  227 01/14/2012     Chemistry      Component Value Date/Time   NA 142 12/08/2011 1257   K 3.9 12/08/2011 1257   CL 106 12/08/2011 1257   CO2 30 12/08/2011 1257   BUN 9 12/08/2011 1257   CREATININE 0.69 12/08/2011 1257      Component Value Date/Time   CALCIUM 8.8 12/08/2011 1257   ALKPHOS 97 12/08/2011 1257   AST 16 12/08/2011 1257   ALT 14 12/08/2011 1257   BILITOT 0.4 12/08/2011 1257     Results for BURLEY, KOPKA (MRN [id number]) as of 01/14/2012 10:45  Ref. Range 10/31/2011 13:24 12/08/2011 12:57  PSA Latest Range: <=4.00 ng/mL 22.86 (H) 6.13 (H)    Impression and Plan: This is a pleasant 66 year old gentleman with the  following issues:  1. 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 He has an elevated PSA up to 33 in May of 2013. After one month of Zytiga,  PSA dropped to 6.13.  The plan is to continue with therapy with the same dose or schedule for now.  2. Bone directed therapy. On Xgeva with calcium supplementation. Also talked to him about complications associated with that which would include osteonecrosis of the jaw and I continued to educated him about dental hygiene.   3. Androgen deprivation: He is to continue Lupron under the care of Dr. Brunilda Payor.   4. Follow-up: In 4-5 weeks.    Rishikesh Khachatryan 7/24/201310:44 AM

## 2012-01-14 NOTE — Telephone Encounter (Signed)
Gave pt appt for August 2013 lab and ML °

## 2012-01-14 NOTE — Addendum Note (Signed)
Addended by: Sherre Poot on: 01/14/2012 10:52 AM   Modules accepted: Orders

## 2012-01-16 ENCOUNTER — Telehealth: Payer: Self-pay | Admitting: *Deleted

## 2012-01-16 NOTE — Telephone Encounter (Signed)
Called patient with PSA results drawn 01/14/2012.  Patient verbalized understanding.

## 2012-01-19 DIAGNOSIS — C61 Malignant neoplasm of prostate: Secondary | ICD-10-CM | POA: Diagnosis not present

## 2012-01-21 NOTE — Progress Notes (Signed)
Received fax from [facility] that pt's prescription was shipped on 01/20/12.  This information given to desk RN.

## 2012-01-23 ENCOUNTER — Other Ambulatory Visit: Payer: Self-pay | Admitting: *Deleted

## 2012-01-23 MED ORDER — PREDNISONE 5 MG PO TABS: 5.0000 mg | ORAL_TABLET | Freq: Two times a day (BID) | ORAL | Status: DC

## 2012-01-29 ENCOUNTER — Telehealth: Payer: Self-pay | Admitting: *Deleted

## 2012-01-29 NOTE — Telephone Encounter (Signed)
[facility] faxed confirmation of Zytiga prescription shipment.  Shipped on 01-28-2012 with next business day delivery.

## 2012-02-03 DIAGNOSIS — D518 Other vitamin B12 deficiency anemias: Secondary | ICD-10-CM | POA: Diagnosis not present

## 2012-02-03 DIAGNOSIS — E7800 Pure hypercholesterolemia, unspecified: Secondary | ICD-10-CM | POA: Diagnosis not present

## 2012-02-03 DIAGNOSIS — J329 Chronic sinusitis, unspecified: Secondary | ICD-10-CM | POA: Diagnosis not present

## 2012-02-16 DIAGNOSIS — G44009 Cluster headache syndrome, unspecified, not intractable: Secondary | ICD-10-CM | POA: Diagnosis not present

## 2012-02-16 DIAGNOSIS — I10 Essential (primary) hypertension: Secondary | ICD-10-CM | POA: Diagnosis not present

## 2012-02-17 DIAGNOSIS — G609 Hereditary and idiopathic neuropathy, unspecified: Secondary | ICD-10-CM | POA: Diagnosis not present

## 2012-02-17 DIAGNOSIS — G2581 Restless legs syndrome: Secondary | ICD-10-CM | POA: Diagnosis not present

## 2012-02-17 DIAGNOSIS — IMO0002 Reserved for concepts with insufficient information to code with codable children: Secondary | ICD-10-CM | POA: Diagnosis not present

## 2012-02-17 DIAGNOSIS — G2589 Other specified extrapyramidal and movement disorders: Secondary | ICD-10-CM | POA: Diagnosis not present

## 2012-02-18 ENCOUNTER — Encounter: Payer: Self-pay | Admitting: Oncology

## 2012-02-18 ENCOUNTER — Telehealth: Payer: Self-pay | Admitting: Oncology

## 2012-02-18 ENCOUNTER — Ambulatory Visit (HOSPITAL_BASED_OUTPATIENT_CLINIC_OR_DEPARTMENT_OTHER): Payer: Medicare Other | Admitting: Oncology

## 2012-02-18 ENCOUNTER — Ambulatory Visit (HOSPITAL_BASED_OUTPATIENT_CLINIC_OR_DEPARTMENT_OTHER): Payer: Medicare Other

## 2012-02-18 ENCOUNTER — Other Ambulatory Visit (HOSPITAL_BASED_OUTPATIENT_CLINIC_OR_DEPARTMENT_OTHER): Payer: Medicare Other | Admitting: Lab

## 2012-02-18 VITALS — BP 147/88 | HR 65 | Temp 97.4°F | Resp 20 | Ht 62.5 in | Wt 176.1 lb

## 2012-02-18 DIAGNOSIS — C61 Malignant neoplasm of prostate: Secondary | ICD-10-CM

## 2012-02-18 DIAGNOSIS — C7951 Secondary malignant neoplasm of bone: Secondary | ICD-10-CM

## 2012-02-18 DIAGNOSIS — C7952 Secondary malignant neoplasm of bone marrow: Secondary | ICD-10-CM

## 2012-02-18 LAB — CBC WITH DIFFERENTIAL/PLATELET
BASO%: 0.3 % (ref 0.0–2.0)
EOS%: 0.8 % (ref 0.0–7.0)
HCT: 40.5 % (ref 38.4–49.9)
LYMPH%: 15.4 % (ref 14.0–49.0)
MCH: 27.7 pg (ref 27.2–33.4)
MCHC: 32.1 g/dL (ref 32.0–36.0)
MCV: 86.3 fL (ref 79.3–98.0)
MONO%: 6 % (ref 0.0–14.0)
NEUT%: 77.5 % — ABNORMAL HIGH (ref 39.0–75.0)
Platelets: 205 10*3/uL (ref 140–400)

## 2012-02-18 LAB — COMPREHENSIVE METABOLIC PANEL (CC13)
AST: 17 U/L (ref 5–34)
Alkaline Phosphatase: 94 U/L (ref 40–150)
BUN: 16 mg/dL (ref 7.0–26.0)
Chloride: 106 mEq/L (ref 98–107)
Creatinine: 0.8 mg/dL (ref 0.7–1.3)
Total Bilirubin: 0.5 mg/dL (ref 0.20–1.20)

## 2012-02-18 LAB — CORRECTED CALCIUM (CC13): Calcium, Corrected: 9.9 mg/dL (ref 8.4–10.4)

## 2012-02-18 MED ORDER — DENOSUMAB 120 MG/1.7ML ~~LOC~~ SOLN
120.0000 mg | Freq: Once | SUBCUTANEOUS | Status: AC
  Administered 2012-02-18: 120 mg via SUBCUTANEOUS
  Filled 2012-02-18: qty 1.7

## 2012-02-18 NOTE — Patient Instructions (Addendum)
Results for [name] Mathews, [name] Mathews (MRN [id number]) as of 02/18/2012 10:13  Ref. Range 09/26/2011 13:16 10/31/2011 13:24 12/08/2011 12:57 01/14/2012 09:35  PSA Latest Range: <=4.00 ng/mL 17.18 (H) 22.86 (H) 6.13 (H) 3.91   Schedule a return visit for early October.

## 2012-02-18 NOTE — Telephone Encounter (Signed)
Gave pt appt calendar for October 2013 lab and MD °

## 2012-02-18 NOTE — Progress Notes (Signed)
Hematology and Oncology Follow Up Visit  TREG DIEMER [id number] July 05, 1945 66 y.o. 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 He was inially diagnosed  2001, gleason score 4+5= 9.  Prior Therapy: 1. 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 He was started on hormone therapy, initially with Lupron   2. Due to  A rise in his PSA Casodex was added to firmagon in 2009.  3. Most recently, his PSA was up to 16, with castrate level testosterone and bone involvement based on a bone scan on 08/2101.  Current therapy:  Zytiga 1000 mg po daily started in 09/2011 for a PSA of 22. He is on Prednisone 5 mg twice a day. Xgeva 120 mg to start on 10/31/2011. Repeated every visit.  Lupron given by Dr. Brunilda Payor.   Interim History: 66 year old man presents today for a follow up visit. On Zytiga and tolerating this well. He reports no symptoms at this point. He reports no bone pain, no GU symptoms. He report no GI symptoms. He has no fluid retention or leg swelling. He has not had not had any back pain, not had any shoulder pain, had not had any major changes in his performance status. He has continued to have excellent quality of life. He has continued to golf regularly and really had not had any major changes in his performance status and not had any constitutional symptoms.   Medications: I have reviewed the patient's current medications. Current outpatient prescriptions:abiraterone Acetate (ZYTIGA) 250 MG tablet, Take 4 tablets (1,000 mg total) by mouth daily. Take on an empty stomach 1 hour before or 2 hours after a meal, Disp: 120 tablet, Rfl: 1;  aspirin 81 MG tablet, Take 81 mg by mouth daily., Disp: , Rfl: ;  atorvastatin (LIPITOR) 10 MG tablet, Take 10 mg  by mouth daily., Disp: , Rfl:  Calcium Carbonate (CALTRATE 600 PO), Take 600 mg by mouth 3 (three) times daily., Disp: , Rfl: ;  fish oil-omega-3 fatty acids 1000 MG capsule, Take 1 g by mouth daily., Disp: , Rfl: ;  predniSONE (DELTASONE) 5 MG tablet, Take 1 tablet (5 mg total) by mouth 2 (two) times daily., Disp: 60 tablet, Rfl: 1;  rOPINIRole (REQUIP) 2 MG tablet, Take 2 mg by mouth at bedtime., Disp: , Rfl:  solifenacin (VESICARE) 5 MG tablet, Take 10 mg by mouth daily., Disp: , Rfl:   Allergies: No Known Allergies  Past Medical History, Surgical history, Social history, and Family History were reviewed and updated.  Review of Systems: Constitutional:  Negative for fever, chills, night sweats, anorexia, weight loss, pain. Cardiovascular: negative Respiratory: negative Neurological: negative Dermatological: negative ENT: negative Skin: Negative. Gastrointestinal: negative Genito-Urinary: negative Hematological and Lymphatic: negative Breast: negative Musculoskeletal: negative Remaining ROS negative.  Physical Exam: Blood pressure 147/88, pulse 65, temperature 97.4 F (36.3 C), temperature source Oral, resp. rate 20, height 5' 2.5" (1.588 m), weight 176 lb 1.6 oz (79.878 kg). ECOG: 0 General appearance: alert Head: Normocephalic, without obvious abnormality, atraumatic Neck: no adenopathy, no carotid bruit, no JVD, supple, symmetrical, trachea midline and thyroid not enlarged, symmetric, no tenderness/mass/nodules Lymph nodes: Cervical, supraclavicular, and axillary nodes normal. Heart:regular rate and rhythm, S1, S2 normal, no murmur, click, rub or gallop Lung:chest clear, no wheezing,  rales, normal symmetric air entry Abdomen: soft, non-tender, without masses or organomegaly EXT:no erythema, induration, or nodules  Lab Results: Lab Results  Component Value Date   WBC 8.9 02/18/2012   HGB 13.0 02/18/2012   HCT 40.5 02/18/2012   MCV 86.3 02/18/2012   PLT 205 02/18/2012      Chemistry      Component Value Date/Time   NA 143 01/14/2012 0935   K 4.4 01/14/2012 0935   CL 107 01/14/2012 0935   CO2 29 01/14/2012 0935   BUN 11 01/14/2012 0935   CREATININE 0.83 01/14/2012 0935      Component Value Date/Time   CALCIUM 9.5 01/14/2012 0935   ALKPHOS 87 01/14/2012 0935   AST 18 01/14/2012 0935   ALT 14 01/14/2012 0935   BILITOT 0.7 01/14/2012 0935     Results for TARRELL, DEBES (MRN [id number]) as of 02/18/2012 10:13  Ref. Range 09/26/2011 13:16 10/31/2011 13:24 12/08/2011 12:57 01/14/2012 09:35  PSA Latest Range: <=4.00 ng/mL 17.18 (H) 22.86 (H) 6.13 (H) 3.91    Impression and Plan: This is a pleasant 66 year old gentleman with the following issues:  1. 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 He has an elevated PSA up to 33 in May of 2013. After one month of Zytiga, PSA dropped to 6.13 and is now down to 3.91 in July.  The plan is to continue with therapy with the same dose or schedule for now. PSA is pending today.  2. Bone directed therapy. On Xgeva with calcium supplementation. Also talked to him about complications associated with that which would include osteonecrosis of the jaw and I continued to educated him about dental hygiene.   3. Androgen deprivation: He is to continue Lupron under the care of Dr. Brunilda Payor.   4. Follow-up: In 4-5 weeks.    [place], [place] 8/28/201310:13 AM

## 2012-02-25 ENCOUNTER — Telehealth: Payer: Self-pay | Admitting: *Deleted

## 2012-02-25 NOTE — Telephone Encounter (Signed)
[facility] faxed confirmation of prescription shipment.  Zytiga shipped 02-24-2012 with next business day delivery.

## 2012-03-05 DIAGNOSIS — G44009 Cluster headache syndrome, unspecified, not intractable: Secondary | ICD-10-CM | POA: Diagnosis not present

## 2012-03-15 DIAGNOSIS — E538 Deficiency of other specified B group vitamins: Secondary | ICD-10-CM | POA: Diagnosis not present

## 2012-03-15 DIAGNOSIS — Z79899 Other long term (current) drug therapy: Secondary | ICD-10-CM | POA: Diagnosis not present

## 2012-03-15 DIAGNOSIS — I10 Essential (primary) hypertension: Secondary | ICD-10-CM | POA: Diagnosis not present

## 2012-03-15 DIAGNOSIS — C61 Malignant neoplasm of prostate: Secondary | ICD-10-CM | POA: Diagnosis not present

## 2012-03-15 DIAGNOSIS — G44009 Cluster headache syndrome, unspecified, not intractable: Secondary | ICD-10-CM | POA: Diagnosis not present

## 2012-03-18 DIAGNOSIS — G609 Hereditary and idiopathic neuropathy, unspecified: Secondary | ICD-10-CM | POA: Diagnosis not present

## 2012-03-18 DIAGNOSIS — IMO0002 Reserved for concepts with insufficient information to code with codable children: Secondary | ICD-10-CM | POA: Diagnosis not present

## 2012-03-18 DIAGNOSIS — G2589 Other specified extrapyramidal and movement disorders: Secondary | ICD-10-CM | POA: Diagnosis not present

## 2012-03-18 DIAGNOSIS — G2581 Restless legs syndrome: Secondary | ICD-10-CM | POA: Diagnosis not present

## 2012-03-22 DIAGNOSIS — G43809 Other migraine, not intractable, without status migrainosus: Secondary | ICD-10-CM | POA: Diagnosis not present

## 2012-03-22 DIAGNOSIS — G44009 Cluster headache syndrome, unspecified, not intractable: Secondary | ICD-10-CM | POA: Diagnosis not present

## 2012-03-24 ENCOUNTER — Other Ambulatory Visit: Payer: Self-pay | Admitting: *Deleted

## 2012-03-24 DIAGNOSIS — C61 Malignant neoplasm of prostate: Secondary | ICD-10-CM

## 2012-03-24 MED ORDER — ABIRATERONE ACETATE 250 MG PO TABS: 1000.0000 mg | ORAL_TABLET | Freq: Every day | ORAL | Status: DC

## 2012-03-26 ENCOUNTER — Telehealth: Payer: Self-pay | Admitting: Oncology

## 2012-03-26 ENCOUNTER — Other Ambulatory Visit (HOSPITAL_BASED_OUTPATIENT_CLINIC_OR_DEPARTMENT_OTHER): Payer: Medicare Other

## 2012-03-26 ENCOUNTER — Ambulatory Visit (HOSPITAL_BASED_OUTPATIENT_CLINIC_OR_DEPARTMENT_OTHER): Payer: Medicare Other | Admitting: Oncology

## 2012-03-26 VITALS — BP 166/79 | HR 53 | Temp 97.4°F | Resp 20 | Ht 62.5 in | Wt 176.4 lb

## 2012-03-26 DIAGNOSIS — C775 Secondary and unspecified malignant neoplasm of intrapelvic lymph nodes: Secondary | ICD-10-CM | POA: Diagnosis not present

## 2012-03-26 DIAGNOSIS — C61 Malignant neoplasm of prostate: Secondary | ICD-10-CM

## 2012-03-26 DIAGNOSIS — C7951 Secondary malignant neoplasm of bone: Secondary | ICD-10-CM

## 2012-03-26 DIAGNOSIS — C7952 Secondary malignant neoplasm of bone marrow: Secondary | ICD-10-CM | POA: Diagnosis not present

## 2012-03-26 LAB — COMPREHENSIVE METABOLIC PANEL (CC13)
ALT: 13 U/L (ref 0–55)
AST: 19 U/L (ref 5–34)
Alkaline Phosphatase: 97 U/L (ref 40–150)
Glucose: 101 mg/dl — ABNORMAL HIGH (ref 70–99)
Potassium: 4.3 mEq/L (ref 3.5–5.1)
Sodium: 142 mEq/L (ref 136–145)
Total Bilirubin: 0.6 mg/dL (ref 0.20–1.20)
Total Protein: 6.6 g/dL (ref 6.4–8.3)

## 2012-03-26 LAB — CBC WITH DIFFERENTIAL/PLATELET
BASO%: 0.4 % (ref 0.0–2.0)
EOS%: 1.3 % (ref 0.0–7.0)
MCH: 28 pg (ref 27.2–33.4)
MCHC: 32.7 g/dL (ref 32.0–36.0)
MCV: 85.6 fL (ref 79.3–98.0)
MONO%: 5.8 % (ref 0.0–14.0)
RBC: 4.39 10*6/uL (ref 4.20–5.82)
RDW: 14.4 % (ref 11.0–14.6)
lymph#: 2.1 10*3/uL (ref 0.9–3.3)

## 2012-03-26 LAB — PSA: PSA: 2.95 ng/mL (ref <=4.00)

## 2012-03-26 MED ORDER — DENOSUMAB 120 MG/1.7ML ~~LOC~~ SOLN
120.0000 mg | Freq: Once | SUBCUTANEOUS | Status: AC
  Administered 2012-03-26: 120 mg via SUBCUTANEOUS
  Filled 2012-03-26: qty 1.7

## 2012-03-26 NOTE — Telephone Encounter (Signed)
appts made and printed for pt  °

## 2012-03-26 NOTE — Progress Notes (Signed)
Hematology and Oncology Follow Up Visit  [name] Mathews [id number] Sep 08, 1945 66 y.o. 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 He was inially diagnosed  2001, gleason score 4+5= 9.  Prior Therapy: 1. 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 He was started on hormone therapy, initially with Lupron   2. Due to  A rise in his PSA Casodex was added to firmagon in 2009.  3. Most recently, his PSA was up to 16, with castrate level testosterone and bone involvement based on a bone scan on 08/2101.  Current therapy:  Zytiga 1000 mg po daily started in 09/2011 for a PSA of 22. He is on Prednisone 5 mg twice a day. Xgeva 120 mg to start on 10/31/2011. Repeated every visit.  Lupron given by Dr. Brunilda Payor.   Interim History: 66 year old man presents today for a follow up visit. On Zytiga and tolerating this well. He reports no symptoms at this point. He reports no bone pain, no GU symptoms. He report no GI symptoms. He has no fluid retention or leg swelling. He has not had not had any back pain, not had any shoulder pain, had not had any major changes in his performance status. He has continued to have excellent quality of life. He has continued to golf regularly and really had not had any major changes in his performance status and not had any constitutional symptoms. He tolerating Xgeva well with out complications.    Medications: I have reviewed the patient's current medications. Current outpatient prescriptions:abiraterone Acetate (ZYTIGA) 250 MG tablet, Take 4 tablets (1,000 mg total) by mouth daily. Take on an empty stomach 1 hour before or 2 hours after a meal, Disp: 120 tablet, Rfl: 1;  aspirin 81 MG tablet, Take 81 mg by mouth daily., Disp: , Rfl: ;   atorvastatin (LIPITOR) 10 MG tablet, Take 10 mg by mouth daily., Disp: , Rfl:  Calcium Carbonate (CALTRATE 600 PO), Take 600 mg by mouth 3 (three) times daily., Disp: , Rfl: ;  fish oil-omega-3 fatty acids 1000 MG capsule, Take 1 g by mouth daily., Disp: , Rfl: ;  predniSONE (DELTASONE) 5 MG tablet, Take 1 tablet (5 mg total) by mouth 2 (two) times daily., Disp: 60 tablet, Rfl: 1;  rOPINIRole (REQUIP) 2 MG tablet, Take 2 mg by mouth at bedtime., Disp: , Rfl:  solifenacin (VESICARE) 5 MG tablet, Take 10 mg by mouth daily., Disp: , Rfl: ;  topiramate (TOPAMAX) 25 MG capsule, Take 25 mg by mouth daily., Disp: , Rfl:  Current facility-administered medications:denosumab (XGEVA) injection 120 mg, 120 mg, Subcutaneous, Once, Benjiman Core, MD  Allergies: No Known Allergies  Past Medical History, Surgical history, Social history, and Family History were reviewed and updated.  Review of Systems: Constitutional:  Negative for fever, chills, night sweats, anorexia, weight loss, pain. Cardiovascular: negative Respiratory: negative Neurological: negative Dermatological: negative ENT: negative Skin: Negative. Gastrointestinal: negative Genito-Urinary: negative Hematological and Lymphatic: negative Breast: negative Musculoskeletal: negative Remaining ROS negative.  Physical Exam: Blood pressure 166/79, pulse 53, temperature 97.4 F (36.3 C), temperature source Oral, resp. rate 20, height 5' 2.5" (1.588 m), weight 176 lb 6.4 oz (80.015 kg). ECOG: 0 General appearance: alert Head: Normocephalic, without obvious abnormality, atraumatic Neck: no adenopathy, no carotid  bruit, no JVD, supple, symmetrical, trachea midline and thyroid not enlarged, symmetric, no tenderness/mass/nodules Lymph nodes: Cervical, supraclavicular, and axillary nodes normal. Heart:regular rate and rhythm, S1, S2 normal, no murmur, click, rub or gallop Lung:chest clear, no wheezing, rales, normal symmetric air entry Abdomen:  soft, non-tender, without masses or organomegaly EXT:no erythema, induration, or nodules  Lab Results: Lab Results  Component Value Date   WBC 10.7* 03/26/2012   HGB 12.3* 03/26/2012   HCT 37.5* 03/26/2012   MCV 85.6 03/26/2012   PLT 206 03/26/2012     Chemistry      Component Value Date/Time   NA 143 02/18/2012 0948   NA 143 01/14/2012 0935   K 3.8 02/18/2012 0948   K 4.4 01/14/2012 0935   CL 106 02/18/2012 0948   CL 107 01/14/2012 0935   CO2 29 02/18/2012 0948   CO2 29 01/14/2012 0935   BUN 16.0 02/18/2012 0948   BUN 11 01/14/2012 0935   CREATININE 0.8 02/18/2012 0948   CREATININE 0.83 01/14/2012 0935      Component Value Date/Time   CALCIUM 9.5 01/14/2012 0935   ALKPHOS 94 02/18/2012 0948   ALKPHOS 87 01/14/2012 0935   AST 17 02/18/2012 0948   AST 18 01/14/2012 0935   ALT 16 02/18/2012 0948   ALT 14 01/14/2012 0935   BILITOT 0.50 02/18/2012 0948   BILITOT 0.7 01/14/2012 0935      Results for [name], Mathews (MRN [id number]) as of 03/26/2012 14:42  Ref. Range 01/14/2012 09:35 02/18/2012 09:48  PSA Latest Range: <=4.00 ng/mL 3.91 3.84    Impression and Plan: This is a pleasant 66 year old gentleman with the following issues:  1. 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 He has an elevated PSA up to 33 in May of 2013. After one month of Zytiga, PSA dropped to 6.13 and is now down to 3.84. The plan is to continue with therapy with the same dose or schedule for now.   2. Bone directed therapy. On Xgeva with calcium supplementation. Also talked to him about complications associated with that which would include osteonecrosis of the jaw and I continued to educated him about dental hygiene.   3. Androgen deprivation: He is to continue Lupron under the care of Dr. Brunilda Payor.   4. Follow-up: In 4-5 weeks.    [name],[name] 10/4/20132:52 PM

## 2012-03-30 ENCOUNTER — Telehealth: Payer: Self-pay | Admitting: *Deleted

## 2012-03-30 NOTE — Telephone Encounter (Signed)
Received fax confirmation of prescription shipment 03/29/12 for delivery next business day.

## 2012-04-19 DIAGNOSIS — IMO0002 Reserved for concepts with insufficient information to code with codable children: Secondary | ICD-10-CM | POA: Diagnosis not present

## 2012-04-19 DIAGNOSIS — G2581 Restless legs syndrome: Secondary | ICD-10-CM | POA: Diagnosis not present

## 2012-04-19 DIAGNOSIS — G609 Hereditary and idiopathic neuropathy, unspecified: Secondary | ICD-10-CM | POA: Diagnosis not present

## 2012-04-19 DIAGNOSIS — G2589 Other specified extrapyramidal and movement disorders: Secondary | ICD-10-CM | POA: Diagnosis not present

## 2012-04-23 ENCOUNTER — Encounter: Payer: Self-pay | Admitting: Oncology

## 2012-04-23 NOTE — Progress Notes (Signed)
Patient receives zytiga copay assistance through [employer], which will end 06/22/12.  [employer] sent the patient a letter stating they do not foresee, at this time, having funds for prostate cancer after December.  Patient will need to fill out a [employer] application in December to receive copay assistance, if approved.

## 2012-04-28 NOTE — Telephone Encounter (Signed)
[facility] faxed confirmation of prescription shipment.  [name] Mathews was shipped 04-27-212 with next business day delivery.

## 2012-04-29 ENCOUNTER — Ambulatory Visit (HOSPITAL_BASED_OUTPATIENT_CLINIC_OR_DEPARTMENT_OTHER): Payer: Medicare Other | Admitting: Oncology

## 2012-04-29 ENCOUNTER — Telehealth: Payer: Self-pay | Admitting: Oncology

## 2012-04-29 ENCOUNTER — Encounter: Payer: Self-pay | Admitting: Oncology

## 2012-04-29 ENCOUNTER — Other Ambulatory Visit (HOSPITAL_BASED_OUTPATIENT_CLINIC_OR_DEPARTMENT_OTHER): Payer: Medicare Other | Admitting: Lab

## 2012-04-29 VITALS — BP 147/94 | HR 59 | Temp 98.6°F | Resp 20 | Ht 62.5 in | Wt 176.7 lb

## 2012-04-29 DIAGNOSIS — C7952 Secondary malignant neoplasm of bone marrow: Secondary | ICD-10-CM

## 2012-04-29 DIAGNOSIS — C61 Malignant neoplasm of prostate: Secondary | ICD-10-CM

## 2012-04-29 DIAGNOSIS — C7951 Secondary malignant neoplasm of bone: Secondary | ICD-10-CM | POA: Diagnosis not present

## 2012-04-29 DIAGNOSIS — E291 Testicular hypofunction: Secondary | ICD-10-CM

## 2012-04-29 DIAGNOSIS — C775 Secondary and unspecified malignant neoplasm of intrapelvic lymph nodes: Secondary | ICD-10-CM | POA: Diagnosis not present

## 2012-04-29 LAB — CBC WITH DIFFERENTIAL/PLATELET
EOS%: 0.4 % (ref 0.0–7.0)
Eosinophils Absolute: 0.1 10*3/uL (ref 0.0–0.5)
LYMPH%: 16.3 % (ref 14.0–49.0)
MCH: 28.2 pg (ref 27.2–33.4)
MCV: 87.5 fL (ref 79.3–98.0)
MONO%: 4.9 % (ref 0.0–14.0)
NEUT#: 9.7 10*3/uL — ABNORMAL HIGH (ref 1.5–6.5)
Platelets: 234 10*3/uL (ref 140–400)
RBC: 4.72 10*6/uL (ref 4.20–5.82)

## 2012-04-29 LAB — COMPREHENSIVE METABOLIC PANEL (CC13)
ALT: 9 U/L (ref 0–55)
AST: 13 U/L (ref 5–34)
Alkaline Phosphatase: 93 U/L (ref 40–150)
BUN: 15 mg/dL (ref 7.0–26.0)
Calcium: 10.1 mg/dL (ref 8.4–10.4)
Chloride: 108 mEq/L — ABNORMAL HIGH (ref 98–107)
Creatinine: 1 mg/dL (ref 0.7–1.3)
Total Bilirubin: 0.37 mg/dL (ref 0.20–1.20)

## 2012-04-29 MED ORDER — PREDNISONE 5 MG PO TABS: 5.0000 mg | ORAL_TABLET | Freq: Two times a day (BID) | ORAL | Status: DC

## 2012-04-29 MED ORDER — DENOSUMAB 120 MG/1.7ML ~~LOC~~ SOLN
120.0000 mg | Freq: Once | SUBCUTANEOUS | Status: AC
  Administered 2012-04-29: 120 mg via SUBCUTANEOUS
  Filled 2012-04-29: qty 1.7

## 2012-04-29 NOTE — Telephone Encounter (Signed)
gv and printed pt appt schedule for Dec °

## 2012-04-29 NOTE — Progress Notes (Signed)
Hematology and Oncology Follow Up Visit  [name] Mathews [id number] June 29, 1945 66 y.o. 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 He was inially diagnosed  2001, gleason score 4+5= 9.  Prior Therapy: 1. 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 He was started on hormone therapy, initially with Lupron   2. Due to  A rise in his PSA Casodex was added to firmagon in 2009.  3. Most recently, his PSA was up to 16, with castrate level testosterone and bone involvement based on a bone scan on 08/2101.  Current therapy:  Zytiga 1000 mg po daily started in 09/2011 for a PSA of 22. He is on Prednisone 5 mg twice a day. Xgeva 120 mg to start on 10/31/2011. Repeated every visit.  Lupron given by Dr. Brunilda Payor.   Interim History: 66 year old man presents today for a follow up visit. On Zytiga and tolerating this well. He reports no symptoms at this point. He reports no bone pain, no GU symptoms. He report no GI symptoms. He has no fluid retention or leg swelling. He has not had not had any back pain, not had any shoulder pain, had not had any major changes in his performance status. He has continued to have excellent quality of life. He has continued to golf regularly and really had not had any major changes in his performance status and not had any constitutional symptoms. He tolerating Xgeva well with out complications.    Medications: I have reviewed the patient's current medications. Current outpatient prescriptions:abiraterone Acetate (ZYTIGA) 250 MG tablet, Take 4 tablets (1,000 mg total) by mouth daily. Take on an empty stomach 1 hour before or 2 hours after a meal, Disp: 120 tablet, Rfl: 1;  aspirin 81 MG tablet, Take 81 mg by mouth daily., Disp: , Rfl: ;   atorvastatin (LIPITOR) 10 MG tablet, Take 10 mg by mouth daily., Disp: , Rfl:  Calcium Carbonate (CALTRATE 600 PO), Take 600 mg by mouth 3 (three) times daily., Disp: , Rfl: ;  fish oil-omega-3 fatty acids 1000 MG capsule, Take 1 g by mouth daily., Disp: , Rfl: ;  gabapentin (NEURONTIN) 600 MG tablet, Take 300 mg by mouth daily., Disp: , Rfl: ;  predniSONE (DELTASONE) 5 MG tablet, Take 1 tablet (5 mg total) by mouth 2 (two) times daily., Disp: 60 tablet, Rfl: 1 rizatriptan (MAXALT) 10 MG tablet, Take 10 mg by mouth as needed. May repeat in 2 hours if needed, Disp: , Rfl: ;  solifenacin (VESICARE) 5 MG tablet, Take 10 mg by mouth daily., Disp: , Rfl: ;  topiramate (TOPAMAX) 25 MG capsule, Take 75 mg by mouth daily. , Disp: , Rfl:  Current facility-administered medications:[COMPLETED] denosumab (XGEVA) injection 120 mg, 120 mg, Subcutaneous, Once, Myrtis Ser, NP, 120 mg at 04/29/12 1357  Allergies: No Known Allergies  Past Medical History, Surgical history, Social history, and Family History were reviewed and updated.  Review of Systems: Constitutional:  Negative for fever, chills, night sweats, anorexia, weight loss, pain. Cardiovascular: negative Respiratory: negative Neurological: negative Dermatological: negative ENT: negative Skin: Negative. Gastrointestinal: negative Genito-Urinary: negative Hematological and Lymphatic: negative Breast: negative Musculoskeletal: negative Remaining ROS negative.  Physical Exam: Blood pressure 147/94, pulse 59, temperature 98.6 F (37 C), temperature source Oral, resp.  rate 20, height 5' 2.5" (1.588 m), weight 176 lb 11.2 oz (80.151 kg). ECOG: 0 General appearance: alert Head: Normocephalic, without obvious abnormality, atraumatic Neck: no adenopathy, no carotid bruit, no JVD, supple, symmetrical, trachea midline and thyroid not enlarged, symmetric, no tenderness/mass/nodules Lymph nodes: Cervical, supraclavicular, and axillary nodes  normal. Heart:regular rate and rhythm, S1, S2 normal, no murmur, click, rub or gallop Lung:chest clear, no wheezing, rales, normal symmetric air entry Abdomen: soft, non-tender, without masses or organomegaly EXT:no erythema, induration, or nodules  Lab Results: Lab Results  Component Value Date   WBC 12.4* 04/29/2012   HGB 13.3 04/29/2012   HCT 41.3 04/29/2012   MCV 87.5 04/29/2012   PLT 234 04/29/2012     Chemistry      Component Value Date/Time   NA 143 04/29/2012 1257   NA 143 01/14/2012 0935   K 3.9 04/29/2012 1257   K 4.4 01/14/2012 0935   CL 108* 04/29/2012 1257   CL 107 01/14/2012 0935   CO2 30* 04/29/2012 1257   CO2 29 01/14/2012 0935   BUN 15.0 04/29/2012 1257   BUN 11 01/14/2012 0935   CREATININE 1.0 04/29/2012 1257   CREATININE 0.83 01/14/2012 0935      Component Value Date/Time   CALCIUM 10.1 04/29/2012 1257   CALCIUM 9.5 01/14/2012 0935   ALKPHOS 93 04/29/2012 1257   ALKPHOS 87 01/14/2012 0935   AST 13 04/29/2012 1257   AST 18 01/14/2012 0935   ALT 9 04/29/2012 1257   ALT 14 01/14/2012 0935   BILITOT 0.37 04/29/2012 1257   BILITOT 0.7 01/14/2012 0935     Results for [name] Mathews, [name] Mathews (MRN [id number]) as of 04/29/2012 14:34  Ref. Range 12/08/2011 12:57 01/14/2012 09:35 02/18/2012 09:48 03/26/2012 14:13  PSA Latest Range: <=4.00 ng/mL 6.13 (H) 3.91 3.84 2.95    Impression and Plan: This is a pleasant 66 year old gentleman with the following issues:  1. 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 He has an elevated PSA up to 33 in May of 2013. After one month of Zytiga, PSA dropped to 6.13 and is now down to 2.95. The plan is to continue with therapy with the same dose or schedule for now.   2. Bone directed therapy. On Xgeva with calcium supplementation. Also talked to him about complications associated with that which would include osteonecrosis of the jaw and I continued to educated him about dental hygiene.   3. 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 Brunilda Payor.   4. Follow-up: In 4-5 weeks.    [name] Mathews 11/7/20132:34 PM

## 2012-04-29 NOTE — Patient Instructions (Addendum)
Results for [name] Mathews, [name] Mathews (MRN [id number]) as of 04/29/2012 13:13  Ref. Range 10/31/2011 13:24 12/08/2011 12:57 01/14/2012 09:35 02/18/2012 09:48 03/26/2012 14:13  PSA Latest Range: <=4.00 ng/mL 22.86 (H) 6.13 (H) 3.91 3.84 2.95

## 2012-05-05 ENCOUNTER — Encounter: Payer: Self-pay | Admitting: Oncology

## 2012-05-05 NOTE — Progress Notes (Signed)
Received approval letter from [employer]. Pt has been approved for Zytiga from 05/04/12 to 05/03/13. Emailed copy of letter to [place] with the [employer].

## 2012-05-14 DIAGNOSIS — J209 Acute bronchitis, unspecified: Secondary | ICD-10-CM | POA: Diagnosis not present

## 2012-05-14 DIAGNOSIS — R05 Cough: Secondary | ICD-10-CM | POA: Diagnosis not present

## 2012-05-14 DIAGNOSIS — Z79899 Other long term (current) drug therapy: Secondary | ICD-10-CM | POA: Diagnosis not present

## 2012-05-14 DIAGNOSIS — R059 Cough, unspecified: Secondary | ICD-10-CM | POA: Diagnosis not present

## 2012-05-18 ENCOUNTER — Other Ambulatory Visit: Payer: Self-pay | Admitting: *Deleted

## 2012-05-18 NOTE — Telephone Encounter (Signed)
THIS REFILL REQUEST FOR ZYTIGA WAS PLACED IN DR.[name]' ACTIVE WORK FOLDER. 

## 2012-05-18 NOTE — Telephone Encounter (Signed)
RECEIVED A FAX FROM [facility] CONCERNING A CONFIRMATION OF FACSIMILE RECEIPT FOR PT. REFERRAL. 

## 2012-05-24 DIAGNOSIS — C61 Malignant neoplasm of prostate: Secondary | ICD-10-CM | POA: Diagnosis not present

## 2012-05-28 ENCOUNTER — Other Ambulatory Visit: Payer: Medicare Other | Admitting: Lab

## 2012-05-28 ENCOUNTER — Ambulatory Visit (HOSPITAL_BASED_OUTPATIENT_CLINIC_OR_DEPARTMENT_OTHER): Payer: Medicare Other | Admitting: Oncology

## 2012-05-28 ENCOUNTER — Telehealth: Payer: Self-pay | Admitting: Oncology

## 2012-05-28 VITALS — BP 128/77 | HR 69 | Temp 98.4°F | Resp 20 | Ht 62.5 in | Wt 178.2 lb

## 2012-05-28 DIAGNOSIS — C7951 Secondary malignant neoplasm of bone: Secondary | ICD-10-CM | POA: Diagnosis not present

## 2012-05-28 DIAGNOSIS — C61 Malignant neoplasm of prostate: Secondary | ICD-10-CM | POA: Diagnosis not present

## 2012-05-28 LAB — COMPREHENSIVE METABOLIC PANEL (CC13)
AST: 13 U/L (ref 5–34)
Alkaline Phosphatase: 86 U/L (ref 40–150)
BUN: 12 mg/dL (ref 7.0–26.0)
Calcium: 9.8 mg/dL (ref 8.4–10.4)
Creatinine: 0.9 mg/dL (ref 0.7–1.3)

## 2012-05-28 LAB — CBC WITH DIFFERENTIAL/PLATELET
Basophils Absolute: 0 10*3/uL (ref 0.0–0.1)
EOS%: 0.3 % (ref 0.0–7.0)
HCT: 38.8 % (ref 38.4–49.9)
HGB: 12.5 g/dL — ABNORMAL LOW (ref 13.0–17.1)
MCH: 27.8 pg (ref 27.2–33.4)
MCV: 86 fL (ref 79.3–98.0)
MONO%: 6.4 % (ref 0.0–14.0)
NEUT%: 76.1 % — ABNORMAL HIGH (ref 39.0–75.0)
Platelets: 213 10*3/uL (ref 140–400)

## 2012-05-28 MED ORDER — DENOSUMAB 120 MG/1.7ML ~~LOC~~ SOLN
120.0000 mg | Freq: Once | SUBCUTANEOUS | Status: AC
  Administered 2012-05-28: 120 mg via SUBCUTANEOUS
  Filled 2012-05-28: qty 1.7

## 2012-05-28 NOTE — Telephone Encounter (Signed)
RECEIVED A FAX FROM [facility] CONCERNING A CONFIRMATION OF PRESCRIPTION SHIPMENT FOR ZYTIGA ON 12 05/13.

## 2012-05-28 NOTE — Progress Notes (Signed)
Hematology and Oncology Follow Up Visit  [name] Mathews [id number] 1946-03-23 66 y.o. 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 He was inially diagnosed  2001, gleason score 4+5= 9.  Prior Therapy: 1. 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 He was started on hormone therapy, initially with Lupron   2. Due to  A rise in his PSA Casodex was added to firmagon in 2009.  3. Most recently, his PSA was up to 16, with castrate level testosterone and bone involvement based on a bone scan on 08/2101.  Current therapy:  Zytiga 1000 mg po daily started in 09/2011 for a PSA of 22. He is on Prednisone 5 mg twice a day. Xgeva 120 mg to start on 10/31/2011. Repeated every visit.  Lupron given by Dr. Brunilda Payor.   Interim History: 66 year old man presents today for a follow up visit. On Zytiga and tolerating this well. He reports no symptoms at this point. He reports no bone pain, no GU symptoms. He report no GI symptoms. He has no fluid retention or leg swelling. He has not had not had any back pain, not had any shoulder pain, had not had any major changes in his performance status. He has continued to have excellent quality of life. He has continued to golf regularly and really had not had any major changes in his performance status and not had any constitutional symptoms. He tolerating Xgeva well with out complications. No new bone pain noted.    Medications: I have reviewed the patient's current medications. Current outpatient prescriptions:abiraterone Acetate (ZYTIGA) 250 MG tablet, Take 4 tablets (1,000 mg total) by mouth daily. Take on an empty stomach 1 hour before or 2 hours after a meal, Disp: 120 tablet, Rfl: 1;  aspirin 81 MG tablet, Take 81 mg by mouth  daily., Disp: , Rfl: ;  atorvastatin (LIPITOR) 10 MG tablet, Take 10 mg by mouth daily., Disp: , Rfl:  Calcium Carbonate (CALTRATE 600 PO), Take 600 mg by mouth 3 (three) times daily., Disp: , Rfl: ;  fish oil-omega-3 fatty acids 1000 MG capsule, Take 1 g by mouth daily., Disp: , Rfl: ;  gabapentin (NEURONTIN) 600 MG tablet, Take 300 mg by mouth daily., Disp: , Rfl: ;  predniSONE (DELTASONE) 5 MG tablet, Take 1 tablet (5 mg total) by mouth 2 (two) times daily., Disp: 60 tablet, Rfl: 1 rizatriptan (MAXALT) 10 MG tablet, Take 10 mg by mouth as needed. May repeat in 2 hours if needed, Disp: , Rfl: ;  solifenacin (VESICARE) 5 MG tablet, Take 10 mg by mouth daily., Disp: , Rfl: ;  topiramate (TOPAMAX) 25 MG capsule, Take 75 mg by mouth daily. , Disp: , Rfl:  Current facility-administered medications:denosumab (XGEVA) injection 120 mg, 120 mg, Subcutaneous, Once, Myrtis Ser, NP  Allergies: No Known Allergies  Past Medical History, Surgical history, Social history, and Family History were reviewed and updated.  Review of Systems: Constitutional:  Negative for fever, chills, night sweats, anorexia, weight loss, pain. Cardiovascular: negative Respiratory: negative Neurological: negative Dermatological: negative ENT: negative Skin: Negative. Gastrointestinal: negative Genito-Urinary: negative Hematological and Lymphatic: negative Breast: negative Musculoskeletal: negative Remaining ROS negative.  Physical Exam: Blood pressure 128/77, pulse 69, temperature 98.4 F (36.9 C), temperature source Oral, resp. rate  20, height 5' 2.5" (1.588 m), weight 178 lb 3.2 oz (80.831 kg). ECOG: 0 General appearance: alert Head: Normocephalic, without obvious abnormality, atraumatic Neck: no adenopathy, no carotid bruit, no JVD, supple, symmetrical, trachea midline and thyroid not enlarged, symmetric, no tenderness/mass/nodules Lymph nodes: Cervical, supraclavicular, and axillary nodes normal. Heart:regular  rate and rhythm, S1, S2 normal, no murmur, click, rub or gallop Lung:chest clear, no wheezing, rales, normal symmetric air entry Abdomen: soft, non-tender, without masses or organomegaly EXT:no erythema, induration, or nodules  Lab Results: Lab Results  Component Value Date   WBC 13.4* 05/28/2012   HGB 12.5* 05/28/2012   HCT 38.8 05/28/2012   MCV 86.0 05/28/2012   PLT 213 05/28/2012     Chemistry      Component Value Date/Time   NA 143 04/29/2012 1257   NA 143 01/14/2012 0935   K 3.9 04/29/2012 1257   K 4.4 01/14/2012 0935   CL 108* 04/29/2012 1257   CL 107 01/14/2012 0935   CO2 30* 04/29/2012 1257   CO2 29 01/14/2012 0935   BUN 15.0 04/29/2012 1257   BUN 11 01/14/2012 0935   CREATININE 1.0 04/29/2012 1257   CREATININE 0.83 01/14/2012 0935      Component Value Date/Time   CALCIUM 10.1 04/29/2012 1257   CALCIUM 9.5 01/14/2012 0935   ALKPHOS 93 04/29/2012 1257   ALKPHOS 87 01/14/2012 0935   AST 13 04/29/2012 1257   AST 18 01/14/2012 0935   ALT 9 04/29/2012 1257   ALT 14 01/14/2012 0935   BILITOT 0.37 04/29/2012 1257   BILITOT 0.7 01/14/2012 0935     Results for [name], Mathews (MRN [id number]) as of 05/28/2012 14:45  Ref. Range 03/26/2012 14:13 04/29/2012 12:57  PSA Latest Range: <=4.00 ng/mL 2.95 3.63     Impression and Plan: This is a pleasant 66 year old gentleman with the following issues:  1. 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 He has an elevated PSA up to 33 in May of 2013. PSA dropped to 3.63 now. The plan is to continue with therapy with the same dose or schedule for now.   2. Bone directed therapy. On Xgeva with calcium supplementation. Also talked to him about complications associated with that which would include osteonecrosis of the jaw and I continued to educated him about dental hygiene.   3. Androgen deprivation: He is to continue Lupron under the care of Dr. Brunilda Payor.   4. Follow-up: In 4-5  weeks.    [name] 12/6/20133:26 PM

## 2012-05-28 NOTE — Telephone Encounter (Signed)
appts made and printed for pt aom °

## 2012-05-31 ENCOUNTER — Telehealth: Payer: Self-pay | Admitting: *Deleted

## 2012-05-31 DIAGNOSIS — G2581 Restless legs syndrome: Secondary | ICD-10-CM | POA: Diagnosis not present

## 2012-05-31 DIAGNOSIS — G609 Hereditary and idiopathic neuropathy, unspecified: Secondary | ICD-10-CM | POA: Diagnosis not present

## 2012-05-31 DIAGNOSIS — IMO0002 Reserved for concepts with insufficient information to code with codable children: Secondary | ICD-10-CM | POA: Diagnosis not present

## 2012-05-31 DIAGNOSIS — G2589 Other specified extrapyramidal and movement disorders: Secondary | ICD-10-CM | POA: Diagnosis not present

## 2012-05-31 NOTE — Telephone Encounter (Signed)
Message copied by Wende Mott on Mon May 31, 2012  3:24 PM ------      Message from: Benjiman Core      Created: Mon May 31, 2012 10:51 AM       Please call his PSA. Stable.

## 2012-05-31 NOTE — Telephone Encounter (Signed)
Called pt w/ PSA result.  He verbalized understanding.

## 2012-06-21 DIAGNOSIS — Z23 Encounter for immunization: Secondary | ICD-10-CM | POA: Diagnosis not present

## 2012-07-01 ENCOUNTER — Encounter: Payer: Self-pay | Admitting: Oncology

## 2012-07-01 ENCOUNTER — Other Ambulatory Visit (HOSPITAL_BASED_OUTPATIENT_CLINIC_OR_DEPARTMENT_OTHER): Payer: Medicare Other | Admitting: Lab

## 2012-07-01 ENCOUNTER — Telehealth: Payer: Self-pay | Admitting: Oncology

## 2012-07-01 ENCOUNTER — Ambulatory Visit (HOSPITAL_BASED_OUTPATIENT_CLINIC_OR_DEPARTMENT_OTHER): Payer: Medicare Other | Admitting: Oncology

## 2012-07-01 VITALS — BP 137/82 | HR 62 | Temp 97.2°F | Resp 20 | Ht 62.5 in | Wt 182.0 lb

## 2012-07-01 DIAGNOSIS — C7951 Secondary malignant neoplasm of bone: Secondary | ICD-10-CM

## 2012-07-01 DIAGNOSIS — E291 Testicular hypofunction: Secondary | ICD-10-CM

## 2012-07-01 DIAGNOSIS — C7952 Secondary malignant neoplasm of bone marrow: Secondary | ICD-10-CM | POA: Diagnosis not present

## 2012-07-01 DIAGNOSIS — C61 Malignant neoplasm of prostate: Secondary | ICD-10-CM | POA: Diagnosis not present

## 2012-07-01 LAB — COMPREHENSIVE METABOLIC PANEL (CC13)
Albumin: 3.2 g/dL — ABNORMAL LOW (ref 3.5–5.0)
Alkaline Phosphatase: 93 U/L (ref 40–150)
BUN: 12 mg/dL (ref 7.0–26.0)
CO2: 30 mEq/L — ABNORMAL HIGH (ref 22–29)
Glucose: 69 mg/dl — ABNORMAL LOW (ref 70–99)
Potassium: 3.8 mEq/L (ref 3.5–5.1)
Total Bilirubin: 0.49 mg/dL (ref 0.20–1.20)
Total Protein: 7 g/dL (ref 6.4–8.3)

## 2012-07-01 LAB — CBC WITH DIFFERENTIAL/PLATELET
Basophils Absolute: 0 10*3/uL (ref 0.0–0.1)
Eosinophils Absolute: 0.1 10*3/uL (ref 0.0–0.5)
HGB: 12.3 g/dL — ABNORMAL LOW (ref 13.0–17.1)
LYMPH%: 19.2 % (ref 14.0–49.0)
MCV: 86.5 fL (ref 79.3–98.0)
MONO#: 1 10*3/uL — ABNORMAL HIGH (ref 0.1–0.9)
MONO%: 8.6 % (ref 0.0–14.0)
NEUT#: 8.5 10*3/uL — ABNORMAL HIGH (ref 1.5–6.5)
Platelets: 230 10*3/uL (ref 140–400)
WBC: 11.8 10*3/uL — ABNORMAL HIGH (ref 4.0–10.3)

## 2012-07-01 LAB — PSA: PSA: 2.82 ng/mL (ref <=4.00)

## 2012-07-01 MED ORDER — PREDNISONE 5 MG PO TABS: 5.0000 mg | ORAL_TABLET | Freq: Two times a day (BID) | ORAL | Status: DC

## 2012-07-01 MED ORDER — DENOSUMAB 120 MG/1.7ML ~~LOC~~ SOLN
120.0000 mg | Freq: Once | SUBCUTANEOUS | Status: AC
  Administered 2012-07-01: 120 mg via SUBCUTANEOUS
  Filled 2012-07-01: qty 1.7

## 2012-07-01 NOTE — Telephone Encounter (Signed)
appts made and printed for pt aom °

## 2012-07-01 NOTE — Progress Notes (Signed)
Hematology and Oncology Follow Up Visit  [name] Mathews [id number] 1945/11/24 67 y.o. 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 He was inially diagnosed  2001, gleason score 4+5= 9.  Prior Therapy: 1. 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 He was started on hormone therapy, initially with Lupron   2. Due to  A rise in his PSA Casodex was added to firmagon in 2009.  3. Most recently, his PSA was up to 16, with castrate level testosterone and bone involvement based on a bone scan on 08/2101.  Current therapy:  Zytiga 1000 mg po daily started in 09/2011 for a PSA of 22. He is on Prednisone 5 mg twice a day. Xgeva 120 mg started on 10/31/2011. Repeated every visit.  Lupron given by Dr. Brunilda Payor.   Interim History: 67 year old man presents today for a follow up visit. On Zytiga and tolerating this well. He reports no symptoms at this point. He reports no bone pain, no GU symptoms. Reports intermittent acid reflux symptoms. Taking Zantac PRN. No abdominal pain, nausea, or vomiting. He has no fluid retention or leg swelling. He has not had not had any back pain, not had any shoulder pain, had not had any major changes in his performance status. He has continued to have excellent quality of life. He has continued to golf regularly and really had not had any major changes in his performance status and not had any constitutional symptoms. He tolerating Xgeva well with out complications. No new bone pain noted.    Medications: I have reviewed the patient's current medications. Current outpatient prescriptions:abiraterone Acetate (ZYTIGA) 250 MG tablet, Take 4 tablets (1,000 mg total) by mouth daily. Take on an empty stomach 1 hour before or 2 hours after a  meal, Disp: 120 tablet, Rfl: 1;  aspirin 81 MG tablet, Take 81 mg by mouth daily., Disp: , Rfl: ;  atorvastatin (LIPITOR) 10 MG tablet, Take 10 mg by mouth daily., Disp: , Rfl:  Calcium Carbonate (CALTRATE 600 PO), Take 600 mg by mouth 3 (three) times daily., Disp: , Rfl: ;  fish oil-omega-3 fatty acids 1000 MG capsule, Take 1 g by mouth daily., Disp: , Rfl: ;  gabapentin (NEURONTIN) 600 MG tablet, Take 300 mg by mouth daily., Disp: , Rfl: ;  predniSONE (DELTASONE) 5 MG tablet, Take 1 tablet (5 mg total) by mouth 2 (two) times daily., Disp: 60 tablet, Rfl: 1 solifenacin (VESICARE) 5 MG tablet, Take 10 mg by mouth daily., Disp: , Rfl:   Allergies: No Known Allergies  Past Medical History, Surgical history, Social history, and Family History were reviewed and updated.  Review of Systems: Constitutional:  Negative for fever, chills, night sweats, anorexia, weight loss, pain. Cardiovascular: negative Respiratory: negative Neurological: negative Dermatological: negative ENT: negative Skin: Negative. Gastrointestinal: negative Genito-Urinary: negative Hematological and Lymphatic: negative Breast: negative Musculoskeletal: negative Remaining ROS negative.  Physical Exam: Blood pressure 137/82, pulse 62, temperature 97.2 F (36.2 C), temperature source Oral, resp. rate 20, height 5' 2.5" (1.588 m), weight 182 lb (82.555 kg). ECOG: 0 General appearance: alert Head: Normocephalic, without obvious abnormality, atraumatic Neck: no adenopathy, no carotid bruit, no JVD, supple, symmetrical, trachea midline and thyroid not enlarged, symmetric, no tenderness/mass/nodules Lymph nodes: Cervical, supraclavicular, and axillary nodes  normal. Heart:regular rate and rhythm, S1, S2 normal, no murmur, click, rub or gallop Lung:chest clear, no wheezing, rales, normal symmetric air entry Abdomen: soft, non-tender, without masses or organomegaly EXT:no erythema, induration, or nodules  Lab Results: Lab  Results  Component Value Date   WBC 11.8* 07/01/2012   HGB 12.3* 07/01/2012   HCT 39.0 07/01/2012   MCV 86.5 07/01/2012   PLT 230 07/01/2012     Chemistry      Component Value Date/Time   NA 145 07/01/2012 1311   NA 143 01/14/2012 0935   K 3.8 07/01/2012 1311   K 4.4 01/14/2012 0935   CL 106 07/01/2012 1311   CL 107 01/14/2012 0935   CO2 30* 07/01/2012 1311   CO2 29 01/14/2012 0935   BUN 12.0 07/01/2012 1311   BUN 11 01/14/2012 0935   CREATININE 0.8 07/01/2012 1311   CREATININE 0.83 01/14/2012 0935      Component Value Date/Time   CALCIUM 9.5 07/01/2012 1311   CALCIUM 9.5 01/14/2012 0935   ALKPHOS 93 07/01/2012 1311   ALKPHOS 87 01/14/2012 0935   AST 15 07/01/2012 1311   AST 18 01/14/2012 0935   ALT <6 Repeated and Verified 07/01/2012 1311   ALT 14 01/14/2012 0935   BILITOT 0.49 07/01/2012 1311   BILITOT 0.7 01/14/2012 0935     Results for [name], [name] (MRN [id number]) as of 07/01/2012 13:46  Ref. Range 01/14/2012 09:35 02/18/2012 09:48 03/26/2012 14:13 04/29/2012 12:57 05/28/2012 14:44  PSA Latest Range: <=4.00 ng/mL 3.91 3.84 2.95 3.63 3.39    Impression and Plan: This is a pleasant 67 year old gentleman with the following issues:  1. 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 He has an elevated PSA up to 33 in May of 2013. PSA dropped to 3.39 now. The plan is to continue with therapy with the same dose or schedule for now.   2. Bone directed therapy. On Xgeva with calcium supplementation. Also talked to him about complications associated with that which would include osteonecrosis of the jaw and I continued to educated him about dental hygiene.   3. Androgen deprivation: He is to continue Lupron under the care of Dr. Brunilda Payor.   4. Follow-up: In 4-5 weeks.    [place], [place] 1/9/20142:43 PM

## 2012-07-01 NOTE — Patient Instructions (Signed)
Results for DESTIN, VINSANT (MRN [id number]) as of 07/01/2012 13:46  Ref. Range 01/14/2012 09:35 02/18/2012 09:48 03/26/2012 14:13 04/29/2012 12:57 05/28/2012 14:44  PSA Latest Range: <=4.00 ng/mL 3.91 3.84 2.95 3.63 3.39

## 2012-07-05 DIAGNOSIS — G4733 Obstructive sleep apnea (adult) (pediatric): Secondary | ICD-10-CM | POA: Diagnosis not present

## 2012-07-05 DIAGNOSIS — E669 Obesity, unspecified: Secondary | ICD-10-CM | POA: Diagnosis not present

## 2012-07-05 DIAGNOSIS — E7800 Pure hypercholesterolemia, unspecified: Secondary | ICD-10-CM | POA: Diagnosis not present

## 2012-07-05 DIAGNOSIS — I10 Essential (primary) hypertension: Secondary | ICD-10-CM | POA: Diagnosis not present

## 2012-07-05 DIAGNOSIS — E538 Deficiency of other specified B group vitamins: Secondary | ICD-10-CM | POA: Diagnosis not present

## 2012-07-19 ENCOUNTER — Other Ambulatory Visit: Payer: Self-pay | Admitting: *Deleted

## 2012-07-19 NOTE — Telephone Encounter (Signed)
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 

## 2012-07-20 ENCOUNTER — Other Ambulatory Visit: Payer: Self-pay | Admitting: *Deleted

## 2012-07-20 NOTE — Telephone Encounter (Signed)
Rec'd fax confirmation from [facility] that order/Rx has been rec'd and once insurance verified with patient, delivery arrangements will be made.

## 2012-07-20 NOTE — Telephone Encounter (Signed)
Faxed script for [facility]iga to [facility] [phone]

## 2012-07-23 NOTE — Telephone Encounter (Signed)
RECEIVED A FAX FROM [facility] CONCERNING A CONFIRMATION OF PRESCRIPTION SHIPMENT FOR ZYTIGA ON 07/22/12.

## 2012-08-04 ENCOUNTER — Encounter: Payer: Self-pay | Admitting: *Deleted

## 2012-08-05 ENCOUNTER — Telehealth: Payer: Self-pay | Admitting: Oncology

## 2012-08-05 ENCOUNTER — Other Ambulatory Visit: Payer: Medicare Other | Admitting: Lab

## 2012-08-05 ENCOUNTER — Ambulatory Visit: Payer: Medicare Other | Admitting: Oncology

## 2012-08-05 NOTE — Telephone Encounter (Signed)
Not able to reach pt re appt for 2/20 or lm. Will call again later.

## 2012-08-07 ENCOUNTER — Other Ambulatory Visit: Payer: Self-pay

## 2012-08-11 ENCOUNTER — Telehealth: Payer: Self-pay | Admitting: Oncology

## 2012-08-11 NOTE — Telephone Encounter (Signed)
Still not able to reach pt re appt

## 2012-08-12 ENCOUNTER — Ambulatory Visit (HOSPITAL_BASED_OUTPATIENT_CLINIC_OR_DEPARTMENT_OTHER): Payer: Medicare Other | Admitting: Oncology

## 2012-08-12 ENCOUNTER — Encounter: Payer: Self-pay | Admitting: Oncology

## 2012-08-12 ENCOUNTER — Other Ambulatory Visit: Payer: Medicare Other | Admitting: Lab

## 2012-08-12 VITALS — BP 114/73 | HR 61 | Temp 98.2°F | Resp 18 | Ht 62.5 in | Wt 184.0 lb

## 2012-08-12 DIAGNOSIS — C7951 Secondary malignant neoplasm of bone: Secondary | ICD-10-CM

## 2012-08-12 DIAGNOSIS — C61 Malignant neoplasm of prostate: Secondary | ICD-10-CM | POA: Diagnosis not present

## 2012-08-12 DIAGNOSIS — C7952 Secondary malignant neoplasm of bone marrow: Secondary | ICD-10-CM

## 2012-08-12 LAB — COMPREHENSIVE METABOLIC PANEL (CC13)
ALT: 10 U/L (ref 0–55)
Albumin: 3.2 g/dL — ABNORMAL LOW (ref 3.5–5.0)
CO2: 31 mEq/L — ABNORMAL HIGH (ref 22–29)
Calcium: 9.7 mg/dL (ref 8.4–10.4)
Chloride: 103 mEq/L (ref 98–107)
Glucose: 91 mg/dl (ref 70–99)
Potassium: 4.3 mEq/L (ref 3.5–5.1)
Sodium: 142 mEq/L (ref 136–145)
Total Protein: 7.1 g/dL (ref 6.4–8.3)

## 2012-08-12 LAB — CBC WITH DIFFERENTIAL/PLATELET
BASO%: 0.1 % (ref 0.0–2.0)
Eosinophils Absolute: 0.1 10*3/uL (ref 0.0–0.5)
LYMPH%: 17.4 % (ref 14.0–49.0)
MCHC: 32.2 g/dL (ref 32.0–36.0)
MONO#: 1 10*3/uL — ABNORMAL HIGH (ref 0.1–0.9)
NEUT#: 9.9 10*3/uL — ABNORMAL HIGH (ref 1.5–6.5)
Platelets: 227 10*3/uL (ref 140–400)
RBC: 4.65 10*6/uL (ref 4.20–5.82)
WBC: 13.3 10*3/uL — ABNORMAL HIGH (ref 4.0–10.3)
lymph#: 2.3 10*3/uL (ref 0.9–3.3)

## 2012-08-12 LAB — PSA: PSA: 2.44 ng/mL (ref <=4.00)

## 2012-08-12 MED ORDER — DENOSUMAB 120 MG/1.7ML ~~LOC~~ SOLN
120.0000 mg | Freq: Once | SUBCUTANEOUS | Status: AC
  Administered 2012-08-12: 120 mg via SUBCUTANEOUS
  Filled 2012-08-12: qty 1.7

## 2012-08-12 NOTE — Patient Instructions (Signed)
Results for [name] Mathews, [name] Mathews (MRN [id number]) as of 08/12/2012 13:11  Ref. Range 03/26/2012 14:13 04/29/2012 12:57 05/28/2012 14:44 07/01/2012 13:11  PSA Latest Range: <=4.00 ng/mL 2.95 3.63 3.39 2.82

## 2012-08-12 NOTE — Progress Notes (Signed)
Hematology and Oncology Follow Up Visit  [name] Mathews [id number] [birth year] 67 y.o. 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 He was inially diagnosed  2001, gleason score 4+5= 9.  Prior Therapy: 1. 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 He was started on hormone therapy, initially with Lupron   2. Due to  A rise in his PSA Casodex was added to firmagon in 2009.  3. Most recently, his PSA was up to 16, with castrate level testosterone and bone involvement based on a bone scan on 08/2101.  Current therapy:  Zytiga 1000 mg po daily started in 09/2011 for a PSA of 22. He is on Prednisone 5 mg twice a day. Xgeva 120 mg started on 10/31/2011. Repeated every visit.  Lupron given by Dr. Brunilda Payor.   Interim History: 67 year old man presents today for a follow up visit. On Zytiga and tolerating this well. He reports no symptoms at this point. He reports no bone pain, no GU symptoms. Reports intermittent acid reflux symptoms. Taking Zantac PRN. No abdominal pain, nausea, or vomiting. He has no fluid retention or leg swelling. He has not had not had any back pain, not had any shoulder pain, had not had any major changes in his performance status. He has continued to have excellent quality of life. He has continued to golf regularly and really had not had any major changes in his performance status and not had any constitutional symptoms. He tolerating Xgeva well with out complications. No new bone pain noted.    Medications: I have reviewed the patient's current medications. Current outpatient prescriptions:cyanocobalamin 1000 MCG tablet, Inject 1,000 mcg into the muscle every 30 (thirty) days., Disp: , Rfl: ;  denosumab (XGEVA) 120 MG/1.7ML SOLN, Inject  120 mg into the skin every 30 (thirty) days., Disp: , Rfl: ;  abiraterone Acetate (ZYTIGA) 250 MG tablet, Take 4 tablets (1,000 mg total) by mouth daily. Take on an empty stomach 1 hour before or 2 hours after a meal, Disp: 120 tablet, Rfl: 1 aspirin 81 MG tablet, Take 81 mg by mouth daily., Disp: , Rfl: ;  atorvastatin (LIPITOR) 10 MG tablet, Take 10 mg by mouth daily., Disp: , Rfl: ;  Calcium Carbonate (CALTRATE 600 PO), Take 600 mg by mouth 3 (three) times daily., Disp: , Rfl: ;  fish oil-omega-3 fatty acids 1000 MG capsule, Take 1 g by mouth daily., Disp: , Rfl: ;  gabapentin (NEURONTIN) 600 MG tablet, Take 300 mg by mouth daily., Disp: , Rfl:  predniSONE (DELTASONE) 5 MG tablet, Take 1 tablet (5 mg total) by mouth 2 (two) times daily., Disp: 60 tablet, Rfl: 1;  solifenacin (VESICARE) 5 MG tablet, Take 10 mg by mouth daily., Disp: , Rfl:   Allergies: No Known Allergies  Past Medical History, Surgical history, Social history, and Family History were reviewed and updated.  Review of Systems: Constitutional:  Negative for fever, chills, night sweats, anorexia, weight loss, pain. Cardiovascular: negative Respiratory: negative Neurological: negative Dermatological: negative ENT: negative Skin: Negative. Gastrointestinal: negative Genito-Urinary: negative Hematological and Lymphatic: negative Breast: negative Musculoskeletal: negative Remaining ROS negative.  Physical Exam: Blood pressure 114/73, pulse 61, temperature 98.2 F (36.8 C), temperature source Oral, resp. rate 18, height 5' 2.5" (1.588 m), weight 184 lb (  83.462 kg). ECOG: 0 General appearance: alert Head: Normocephalic, without obvious abnormality, atraumatic Neck: no adenopathy, no carotid bruit, no JVD, supple, symmetrical, trachea midline and thyroid not enlarged, symmetric, no tenderness/mass/nodules Lymph nodes: Cervical, supraclavicular, and axillary nodes normal. Heart:regular rate and rhythm, S1, S2 normal, no murmur,  click, rub or gallop Lung:chest clear, no wheezing, rales, normal symmetric air entry Abdomen: soft, non-tender, without masses or organomegaly EXT:no erythema, induration, or nodules  Lab Results: Lab Results  Component Value Date   WBC 13.3* 08/12/2012   HGB 12.5* 08/12/2012   HCT 38.9 08/12/2012   MCV 83.6 08/12/2012   PLT 227 08/12/2012     Chemistry      Component Value Date/Time   NA 142 08/12/2012 1247   NA 143 01/14/2012 0935   K 4.3 08/12/2012 1247   K 4.4 01/14/2012 0935   CL 103 08/12/2012 1247   CL 107 01/14/2012 0935   CO2 31* 08/12/2012 1247   CO2 29 01/14/2012 0935   BUN 12.8 08/12/2012 1247   BUN 11 01/14/2012 0935   CREATININE 0.8 08/12/2012 1247   CREATININE 0.83 01/14/2012 0935      Component Value Date/Time   CALCIUM 9.7 08/12/2012 1247   CALCIUM 9.5 01/14/2012 0935   ALKPHOS 89 08/12/2012 1247   ALKPHOS 87 01/14/2012 0935   AST 16 08/12/2012 1247   AST 18 01/14/2012 0935   ALT 10 08/12/2012 1247   ALT 14 01/14/2012 0935   BILITOT 0.45 08/12/2012 1247   BILITOT 0.7 01/14/2012 0935     Results for [name] Mathews, [name] Mathews (MRN [id number]) as of 08/12/2012 13:11  Ref. Range 03/26/2012 14:13 04/29/2012 12:57 05/28/2012 14:44 07/01/2012 13:11  PSA Latest Range: <=4.00 ng/mL 2.95 3.63 3.39 2.82    Impression and Plan: This is a pleasant 68 year old gentleman with the following issues:  1. 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 He has an elevated PSA up to 33 in May of 2013. PSA dropped to 2.82 now. The plan is to continue with therapy with the same dose or schedule for now.   2. Bone directed therapy. On Xgeva with calcium supplementation. Also talked to him about complications associated with that which would include osteonecrosis of the jaw and I continued to educated him about dental hygiene.   3. Androgen deprivation: He is to continue Lupron under the care of Dr. Brunilda Payor.   4. Follow-up: In 4-5 weeks.    [place],  [place] 2/20/20142:59 PM

## 2012-08-13 ENCOUNTER — Telehealth: Payer: Self-pay | Admitting: Oncology

## 2012-08-13 ENCOUNTER — Telehealth: Payer: Self-pay | Admitting: *Deleted

## 2012-08-13 NOTE — Telephone Encounter (Signed)
Message copied by Reesa Chew on Fri Aug 13, 2012 10:01 AM ------      Message from: Myrtis Ser      Created: Fri Aug 13, 2012  8:38 AM       Call pt with PSA result. ------

## 2012-08-13 NOTE — Telephone Encounter (Signed)
Talked to pt and gave him appt for March 2014 lab and April 2014 Md visit

## 2012-08-13 NOTE — Telephone Encounter (Signed)
Called patient with PSA results from yesterday

## 2012-08-16 ENCOUNTER — Other Ambulatory Visit: Payer: Self-pay | Admitting: *Deleted

## 2012-08-16 NOTE — Telephone Encounter (Signed)
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 

## 2012-08-17 ENCOUNTER — Other Ambulatory Visit: Payer: Self-pay | Admitting: *Deleted

## 2012-08-17 DIAGNOSIS — C61 Malignant neoplasm of prostate: Secondary | ICD-10-CM

## 2012-08-17 DIAGNOSIS — E538 Deficiency of other specified B group vitamins: Secondary | ICD-10-CM | POA: Diagnosis not present

## 2012-08-17 MED ORDER — ABIRATERONE ACETATE 250 MG PO TABS: 1000.0000 mg | ORAL_TABLET | Freq: Every day | ORAL | Status: DC

## 2012-08-17 NOTE — Telephone Encounter (Signed)
RECEIVED A FAX FROM [facility] CONCERNING A CONFIRMATION OF FACSIMILE RECEIPT FOR PT.'S REFERRAL. 

## 2012-08-17 NOTE — Telephone Encounter (Signed)
Rx faxed to [facility] for Zytiga 250mg   Q#120 R-0. Pt  take 4 tablets by mouth all at once, one time daily on an empty stomach 1 hour before or 2 hours after a meal. Q

## 2012-08-19 NOTE — Telephone Encounter (Signed)
RECEIVED A FAX FROM [facility] CONCERNING A CONFIRMATION OF PRESCRIPTION SHIPMENT FOR ZYTIGA ON 08/18/12.

## 2012-08-30 DIAGNOSIS — IMO0002 Reserved for concepts with insufficient information to code with codable children: Secondary | ICD-10-CM | POA: Diagnosis not present

## 2012-08-30 DIAGNOSIS — G609 Hereditary and idiopathic neuropathy, unspecified: Secondary | ICD-10-CM | POA: Diagnosis not present

## 2012-08-30 DIAGNOSIS — H04129 Dry eye syndrome of unspecified lacrimal gland: Secondary | ICD-10-CM | POA: Diagnosis not present

## 2012-08-30 DIAGNOSIS — H2510 Age-related nuclear cataract, unspecified eye: Secondary | ICD-10-CM | POA: Diagnosis not present

## 2012-08-30 DIAGNOSIS — G2581 Restless legs syndrome: Secondary | ICD-10-CM | POA: Diagnosis not present

## 2012-08-30 DIAGNOSIS — G2589 Other specified extrapyramidal and movement disorders: Secondary | ICD-10-CM | POA: Diagnosis not present

## 2012-09-09 DIAGNOSIS — R52 Pain, unspecified: Secondary | ICD-10-CM | POA: Diagnosis not present

## 2012-09-09 DIAGNOSIS — R51 Headache: Secondary | ICD-10-CM | POA: Diagnosis not present

## 2012-09-09 DIAGNOSIS — Z8669 Personal history of other diseases of the nervous system and sense organs: Secondary | ICD-10-CM | POA: Diagnosis not present

## 2012-09-09 DIAGNOSIS — Z79899 Other long term (current) drug therapy: Secondary | ICD-10-CM | POA: Diagnosis not present

## 2012-09-15 ENCOUNTER — Other Ambulatory Visit: Payer: Self-pay | Admitting: *Deleted

## 2012-09-15 NOTE — Telephone Encounter (Signed)
Refill request from [facility] left with Dr Clelia Croft for approval

## 2012-09-16 ENCOUNTER — Other Ambulatory Visit (HOSPITAL_BASED_OUTPATIENT_CLINIC_OR_DEPARTMENT_OTHER): Payer: Medicare Other

## 2012-09-16 DIAGNOSIS — C61 Malignant neoplasm of prostate: Secondary | ICD-10-CM | POA: Diagnosis not present

## 2012-09-16 LAB — COMPREHENSIVE METABOLIC PANEL (CC13)
AST: 17 U/L (ref 5–34)
Alkaline Phosphatase: 88 U/L (ref 40–150)
BUN: 11.7 mg/dL (ref 7.0–26.0)
Creatinine: 0.8 mg/dL (ref 0.7–1.3)
Glucose: 107 mg/dl — ABNORMAL HIGH (ref 70–99)
Potassium: 4.5 mEq/L (ref 3.5–5.1)
Total Bilirubin: 0.52 mg/dL (ref 0.20–1.20)

## 2012-09-16 LAB — CBC WITH DIFFERENTIAL/PLATELET
Basophils Absolute: 0 10*3/uL (ref 0.0–0.1)
EOS%: 0.2 % (ref 0.0–7.0)
Eosinophils Absolute: 0 10*3/uL (ref 0.0–0.5)
HGB: 12.1 g/dL — ABNORMAL LOW (ref 13.0–17.1)
LYMPH%: 16.5 % (ref 14.0–49.0)
MCH: 26.9 pg — ABNORMAL LOW (ref 27.2–33.4)
MCV: 83.4 fL (ref 79.3–98.0)
MONO%: 5.1 % (ref 0.0–14.0)
NEUT%: 78 % — ABNORMAL HIGH (ref 39.0–75.0)
Platelets: 203 10*3/uL (ref 140–400)
RDW: 14 % (ref 11.0–14.6)

## 2012-09-20 DIAGNOSIS — E538 Deficiency of other specified B group vitamins: Secondary | ICD-10-CM | POA: Diagnosis not present

## 2012-09-22 DIAGNOSIS — C61 Malignant neoplasm of prostate: Secondary | ICD-10-CM | POA: Diagnosis not present

## 2012-09-24 ENCOUNTER — Encounter: Payer: Self-pay | Admitting: *Deleted

## 2012-09-24 ENCOUNTER — Telehealth: Payer: Self-pay | Admitting: Oncology

## 2012-09-24 ENCOUNTER — Ambulatory Visit (HOSPITAL_BASED_OUTPATIENT_CLINIC_OR_DEPARTMENT_OTHER): Payer: Medicare Other | Admitting: Oncology

## 2012-09-24 VITALS — BP 133/81 | HR 77 | Temp 97.3°F | Resp 18 | Ht 62.5 in | Wt 187.7 lb

## 2012-09-24 DIAGNOSIS — C61 Malignant neoplasm of prostate: Secondary | ICD-10-CM | POA: Diagnosis not present

## 2012-09-24 DIAGNOSIS — E291 Testicular hypofunction: Secondary | ICD-10-CM

## 2012-09-24 DIAGNOSIS — C7951 Secondary malignant neoplasm of bone: Secondary | ICD-10-CM

## 2012-09-24 DIAGNOSIS — C7952 Secondary malignant neoplasm of bone marrow: Secondary | ICD-10-CM

## 2012-09-24 MED ORDER — DENOSUMAB 120 MG/1.7ML ~~LOC~~ SOLN
120.0000 mg | Freq: Once | SUBCUTANEOUS | Status: AC
  Administered 2012-09-24: 120 mg via SUBCUTANEOUS
  Filled 2012-09-24: qty 1.7

## 2012-09-24 NOTE — Telephone Encounter (Signed)
GAve pt appt for May 2014 ML and lab

## 2012-09-24 NOTE — Progress Notes (Signed)
Hematology and Oncology Follow Up Visit  [name] Mathews [id number] November 23, 1945 67 y.o. 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 He was inially diagnosed  2001, gleason score 4+5= 9.  Prior Therapy: 1. 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 He was started on hormone therapy, initially with Lupron   2. Due to  A rise in his PSA Casodex was added to firmagon in 2009.  3. Most recently, his PSA was up to 16, with castrate level testosterone and bone involvement based on a bone scan on 08/2101.  Current therapy:  Zytiga 1000 mg po daily started in 09/2011 for a PSA of 22. He is on Prednisone 5 mg twice a day with excellent response, PSA dropping from 33 to 2.34. Xgeva 120 mg started on 10/31/2011. Repeated every visit.  Lupron given by Dr. Brunilda Payor.   Interim History: 67 year old man presents today for a follow up visit. On Zytiga and tolerating this well. He reports no symptoms at this point. He reports no bone pain, no GU symptoms. No abdominal pain, nausea, or vomiting. He has no fluid retention or leg swelling. He has not had not had any back pain, not had any shoulder pain, had not had any major changes in his performance status. He has continued to have excellent quality of life. He has continued to golf regularly and really had not had any major changes in his performance status and not had any constitutional symptoms. He tolerating Xgeva well with out complications. No new bone pain noted. He did develop a mouth sore that has resolved now.    Medications: I have reviewed the patient's current medications. Current outpatient prescriptions:abiraterone Acetate (ZYTIGA) 250 MG tablet, Take 4 tablets (1,000 mg total) by mouth daily. Take on an  empty stomach 1 hour before or 2 hours after a meal, Disp: 120 tablet, Rfl: 0;  aspirin 81 MG tablet, Take 81 mg by mouth daily., Disp: , Rfl: ;  atorvastatin (LIPITOR) 10 MG tablet, Take 10 mg by mouth daily., Disp: , Rfl:  Calcium Carbonate (CALTRATE 600 PO), Take 600 mg by mouth 3 (three) times daily., Disp: , Rfl: ;  cyanocobalamin 1000 MCG tablet, Inject 1,000 mcg into the muscle every 30 (thirty) days., Disp: , Rfl: ;  denosumab (XGEVA) 120 MG/1.7ML SOLN, Inject 120 mg into the skin every 30 (thirty) days., Disp: , Rfl: ;  fish oil-omega-3 fatty acids 1000 MG capsule, Take 1 g by mouth daily., Disp: , Rfl:  LYRICA 50 MG capsule, Take 50 mg by mouth 3 (three) times daily., Disp: , Rfl: ;  predniSONE (DELTASONE) 5 MG tablet, Take 1 tablet (5 mg total) by mouth 2 (two) times daily., Disp: 60 tablet, Rfl: 1;  solifenacin (VESICARE) 5 MG tablet, Take 10 mg by mouth daily., Disp: , Rfl:  Current facility-administered medications:denosumab (XGEVA) injection 120 mg, 120 mg, Subcutaneous, Once, Myrtis Ser, NP  Allergies: No Known Allergies  Past Medical History, Surgical history, Social history, and Family History were reviewed and updated.  Review of Systems: Constitutional:  Negative for fever, chills, night sweats, anorexia, weight loss, pain. Cardiovascular: negative Respiratory: negative Neurological: negative Dermatological: negative ENT: negative Skin: Negative. Gastrointestinal: negative Genito-Urinary: negative Hematological and Lymphatic: negative Breast: negative Musculoskeletal: negative Remaining ROS  negative.  Physical Exam: Blood pressure 133/81, pulse 77, temperature 97.3 F (36.3 C), temperature source Oral, resp. rate 18, height 5' 2.5" (1.588 m), weight 187 lb 11.2 oz (85.14 kg). ECOG: 0 General appearance: alert Head: Normocephalic, without obvious abnormality, atraumatic Neck: no adenopathy, no carotid bruit, no JVD, supple, symmetrical, trachea midline and  thyroid not enlarged, symmetric, no tenderness/mass/nodules Lymph nodes: Cervical, supraclavicular, and axillary nodes normal. Heart:regular rate and rhythm, S1, S2 normal, no murmur, click, rub or gallop Lung:chest clear, no wheezing, rales, normal symmetric air entry Abdomen: soft, non-tender, without masses or organomegaly EXT:no erythema, induration, or nodules  Lab Results: Lab Results  Component Value Date   WBC 11.9* 09/16/2012   HGB 12.1* 09/16/2012   HCT 37.4* 09/16/2012   MCV 83.4 09/16/2012   PLT 203 09/16/2012     Chemistry      Component Value Date/Time   NA 142 09/16/2012 1419   NA 143 01/14/2012 0935   K 4.5 09/16/2012 1419   K 4.4 01/14/2012 0935   CL 106 09/16/2012 1419   CL 107 01/14/2012 0935   CO2 31* 09/16/2012 1419   CO2 29 01/14/2012 0935   BUN 11.7 09/16/2012 1419   BUN 11 01/14/2012 0935   CREATININE 0.8 09/16/2012 1419   CREATININE 0.83 01/14/2012 0935      Component Value Date/Time   CALCIUM 9.6 09/16/2012 1419   CALCIUM 9.5 01/14/2012 0935   ALKPHOS 88 09/16/2012 1419   ALKPHOS 87 01/14/2012 0935   AST 17 09/16/2012 1419   AST 18 01/14/2012 0935   ALT 11 09/16/2012 1419   ALT 14 01/14/2012 0935   BILITOT 0.52 09/16/2012 1419   BILITOT 0.7 01/14/2012 0935      Results for ARNIE, CLINGENPEEL (MRN [id number]) as of 09/24/2012 15:55  Ref. Range 07/01/2012 13:11 08/12/2012 12:47 09/16/2012 14:19  PSA Latest Range: <=4.00 ng/mL 2.82 2.44 2.34    Impression and Plan: This is a pleasant 67 year old gentleman with the following issues:  1. 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 He has an elevated PSA up to 33 in May of 2013. PSA dropped to 2.34 now. The plan is to continue with therapy with the same dose or schedule for now.   2. Bone directed therapy. On Xgeva with calcium supplementation. Also talked to him about complications associated with that which would include osteonecrosis of the jaw and I continued to  educated him about dental hygiene.   3. Androgen deprivation: He is to continue Lupron under the care of Dr. Brunilda Payor.   4. Follow-up: In 4-5 weeks.    [facility] 4/4/20144:05 PM

## 2012-09-24 NOTE — Progress Notes (Signed)
RECEIVED A FAX FROM [facility] CONCERNING A CONFIRMATION OF PRESCRIPTION SHIPMENT FOR ZYTIGA ON 09/23/12.

## 2012-09-28 DIAGNOSIS — G2581 Restless legs syndrome: Secondary | ICD-10-CM | POA: Diagnosis not present

## 2012-09-28 DIAGNOSIS — IMO0002 Reserved for concepts with insufficient information to code with codable children: Secondary | ICD-10-CM | POA: Diagnosis not present

## 2012-09-28 DIAGNOSIS — G2589 Other specified extrapyramidal and movement disorders: Secondary | ICD-10-CM | POA: Diagnosis not present

## 2012-09-28 DIAGNOSIS — G609 Hereditary and idiopathic neuropathy, unspecified: Secondary | ICD-10-CM | POA: Diagnosis not present

## 2012-09-29 DIAGNOSIS — E538 Deficiency of other specified B group vitamins: Secondary | ICD-10-CM | POA: Diagnosis not present

## 2012-09-29 DIAGNOSIS — D539 Nutritional anemia, unspecified: Secondary | ICD-10-CM | POA: Diagnosis not present

## 2012-09-29 DIAGNOSIS — Z79899 Other long term (current) drug therapy: Secondary | ICD-10-CM | POA: Diagnosis not present

## 2012-09-29 DIAGNOSIS — N189 Chronic kidney disease, unspecified: Secondary | ICD-10-CM | POA: Diagnosis not present

## 2012-10-07 DIAGNOSIS — E538 Deficiency of other specified B group vitamins: Secondary | ICD-10-CM | POA: Diagnosis not present

## 2012-10-07 DIAGNOSIS — I10 Essential (primary) hypertension: Secondary | ICD-10-CM | POA: Diagnosis not present

## 2012-10-07 DIAGNOSIS — E7800 Pure hypercholesterolemia, unspecified: Secondary | ICD-10-CM | POA: Diagnosis not present

## 2012-10-07 DIAGNOSIS — G589 Mononeuropathy, unspecified: Secondary | ICD-10-CM | POA: Diagnosis not present

## 2012-10-07 DIAGNOSIS — E669 Obesity, unspecified: Secondary | ICD-10-CM | POA: Diagnosis not present

## 2012-10-08 DIAGNOSIS — Z79899 Other long term (current) drug therapy: Secondary | ICD-10-CM | POA: Diagnosis not present

## 2012-10-08 DIAGNOSIS — G44009 Cluster headache syndrome, unspecified, not intractable: Secondary | ICD-10-CM | POA: Diagnosis not present

## 2012-10-25 DIAGNOSIS — E538 Deficiency of other specified B group vitamins: Secondary | ICD-10-CM | POA: Diagnosis not present

## 2012-10-26 DIAGNOSIS — G4710 Hypersomnia, unspecified: Secondary | ICD-10-CM | POA: Diagnosis not present

## 2012-10-26 DIAGNOSIS — G2589 Other specified extrapyramidal and movement disorders: Secondary | ICD-10-CM | POA: Diagnosis not present

## 2012-10-26 DIAGNOSIS — Z79899 Other long term (current) drug therapy: Secondary | ICD-10-CM | POA: Diagnosis not present

## 2012-10-26 DIAGNOSIS — R0902 Hypoxemia: Secondary | ICD-10-CM | POA: Diagnosis not present

## 2012-10-26 DIAGNOSIS — G609 Hereditary and idiopathic neuropathy, unspecified: Secondary | ICD-10-CM | POA: Diagnosis not present

## 2012-10-26 DIAGNOSIS — IMO0002 Reserved for concepts with insufficient information to code with codable children: Secondary | ICD-10-CM | POA: Diagnosis not present

## 2012-10-26 DIAGNOSIS — G4730 Sleep apnea, unspecified: Secondary | ICD-10-CM | POA: Diagnosis not present

## 2012-10-26 DIAGNOSIS — G2581 Restless legs syndrome: Secondary | ICD-10-CM | POA: Diagnosis not present

## 2012-10-26 DIAGNOSIS — Z7982 Long term (current) use of aspirin: Secondary | ICD-10-CM | POA: Diagnosis not present

## 2012-11-02 ENCOUNTER — Other Ambulatory Visit (HOSPITAL_BASED_OUTPATIENT_CLINIC_OR_DEPARTMENT_OTHER): Payer: Medicare Other | Admitting: Lab

## 2012-11-02 ENCOUNTER — Encounter: Payer: Self-pay | Admitting: Oncology

## 2012-11-02 ENCOUNTER — Ambulatory Visit (HOSPITAL_BASED_OUTPATIENT_CLINIC_OR_DEPARTMENT_OTHER): Payer: Medicare Other | Admitting: Oncology

## 2012-11-02 ENCOUNTER — Telehealth: Payer: Self-pay | Admitting: Oncology

## 2012-11-02 VITALS — BP 145/71 | HR 53 | Temp 98.1°F | Resp 18 | Ht 62.5 in | Wt 179.2 lb

## 2012-11-02 DIAGNOSIS — C7952 Secondary malignant neoplasm of bone marrow: Secondary | ICD-10-CM | POA: Diagnosis not present

## 2012-11-02 DIAGNOSIS — C7951 Secondary malignant neoplasm of bone: Secondary | ICD-10-CM | POA: Diagnosis not present

## 2012-11-02 DIAGNOSIS — C61 Malignant neoplasm of prostate: Secondary | ICD-10-CM | POA: Diagnosis not present

## 2012-11-02 DIAGNOSIS — E291 Testicular hypofunction: Secondary | ICD-10-CM

## 2012-11-02 LAB — COMPREHENSIVE METABOLIC PANEL (CC13)
Albumin: 3 g/dL — ABNORMAL LOW (ref 3.5–5.0)
CO2: 30 mEq/L — ABNORMAL HIGH (ref 22–29)
Glucose: 84 mg/dl (ref 70–99)
Potassium: 4.3 mEq/L (ref 3.5–5.1)
Sodium: 142 mEq/L (ref 136–145)
Total Protein: 6.5 g/dL (ref 6.4–8.3)

## 2012-11-02 LAB — CBC WITH DIFFERENTIAL/PLATELET
Eosinophils Absolute: 0.1 10*3/uL (ref 0.0–0.5)
MONO#: 0.9 10*3/uL (ref 0.1–0.9)
NEUT#: 7.8 10*3/uL — ABNORMAL HIGH (ref 1.5–6.5)
Platelets: 217 10*3/uL (ref 140–400)
RBC: 4.77 10*6/uL (ref 4.20–5.82)
RDW: 14.2 % (ref 11.0–14.6)
WBC: 11.8 10*3/uL — ABNORMAL HIGH (ref 4.0–10.3)

## 2012-11-02 LAB — PSA: PSA: 3.53 ng/mL (ref <=4.00)

## 2012-11-02 MED ORDER — DENOSUMAB 120 MG/1.7ML ~~LOC~~ SOLN
120.0000 mg | Freq: Once | SUBCUTANEOUS | Status: AC
  Administered 2012-11-02: 120 mg via SUBCUTANEOUS
  Filled 2012-11-02: qty 1.7

## 2012-11-02 NOTE — Progress Notes (Signed)
Hematology and Oncology Follow Up Visit  [name] Mathews [id number] Oct 14, 1945 67 y.o. 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 He was inially diagnosed  2001, gleason score 4+5= 9.  Prior Therapy: 1. 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 He was started on hormone therapy, initially with Lupron   2. Due to  A rise in his PSA Casodex was added to firmagon in 2009.  3. Most recently, his PSA was up to 16, with castrate level testosterone and bone involvement based on a bone scan on 08/2101.  Current therapy:  Zytiga 1000 mg po daily started in 09/2011 for a PSA of 22. He is on Prednisone 5 mg twice a day with excellent response, PSA dropping from 33 to 2.34. Xgeva 120 mg started on 10/31/2011. Repeated every visit.  Lupron given by Dr. Brunilda Payor.   Interim History: 67 year old man presents today for a follow up visit. On Zytiga and tolerating this well. He reports no symptoms at this point. He reports no bone pain, no GU symptoms. No abdominal pain, nausea, or vomiting. He has no fluid retention or leg swelling. He has not had not had any back pain, not had any shoulder pain, had not had any major changes in his performance status. He has continued to have excellent quality of life. He has continued to golf regularly and really had not had any major changes in his performance status and not had any constitutional symptoms. He tolerating Xgeva well with out complications. No new bone pain noted.   Medications: I have reviewed the patient's current medications. Current outpatient prescriptions:DULoxetine (CYMBALTA) 30 MG capsule, Take 30 mg by mouth daily., Disp: , Rfl: ;  rOPINIRole (REQUIP) 2 MG tablet, Take 2 mg by mouth 2 (two) times daily.,  Disp: , Rfl: ;  abiraterone Acetate (ZYTIGA) 250 MG tablet, Take 4 tablets (1,000 mg total) by mouth daily. Take on an empty stomach 1 hour before or 2 hours after a meal, Disp: 120 tablet, Rfl: 0;  aspirin 81 MG tablet, Take 81 mg by mouth daily., Disp: , Rfl:  atorvastatin (LIPITOR) 10 MG tablet, Take 10 mg by mouth daily., Disp: , Rfl: ;  Calcium Carbonate (CALTRATE 600 PO), Take 600 mg by mouth 3 (three) times daily., Disp: , Rfl: ;  cyanocobalamin 1000 MCG tablet, Inject 1,000 mcg into the muscle every 30 (thirty) days., Disp: , Rfl: ;  denosumab (XGEVA) 120 MG/1.7ML SOLN, Inject 120 mg into the skin every 30 (thirty) days., Disp: , Rfl:  fish oil-omega-3 fatty acids 1000 MG capsule, Take 1 g by mouth daily., Disp: , Rfl: ;  predniSONE (DELTASONE) 5 MG tablet, Take 1 tablet (5 mg total) by mouth 2 (two) times daily., Disp: 60 tablet, Rfl: 1;  solifenacin (VESICARE) 5 MG tablet, Take 10 mg by mouth daily., Disp: , Rfl:   Allergies: No Known Allergies  Past Medical History, Surgical history, Social history, and Family History were reviewed and updated.  Review of Systems: Constitutional:  Negative for fever, chills, night sweats, anorexia, weight loss, pain. Cardiovascular: negative Respiratory: negative Neurological: negative Dermatological: negative ENT: negative Skin: Negative. Gastrointestinal: negative Genito-Urinary: negative Hematological and Lymphatic: negative Breast: negative Musculoskeletal: negative Remaining ROS negative.  Physical Exam: Blood pressure 145/71, pulse 53,  temperature 98.1 F (36.7 C), temperature source Oral, resp. rate 18, height 5' 2.5" (1.588 m), weight 179 lb 3.2 oz (81.285 kg). ECOG: 0 General appearance: alert Head: Normocephalic, without obvious abnormality, atraumatic Neck: no adenopathy, no carotid bruit, no JVD, supple, symmetrical, trachea midline and thyroid not enlarged, symmetric, no tenderness/mass/nodules Lymph nodes: Cervical,  supraclavicular, and axillary nodes normal. Heart:regular rate and rhythm, S1, S2 normal, no murmur, click, rub or gallop Lung:chest clear, no wheezing, rales, normal symmetric air entry Abdomen: soft, non-tender, without masses or organomegaly EXT:no erythema, induration, or nodules  Lab Results: Lab Results  Component Value Date   WBC 11.8* 11/02/2012   HGB 12.6* 11/02/2012   HCT 39.2 11/02/2012   MCV 82.3 11/02/2012   PLT 217 11/02/2012     Chemistry      Component Value Date/Time   NA 142 11/02/2012 1236   NA 143 01/14/2012 0935   K 4.3 11/02/2012 1236   K 4.4 01/14/2012 0935   CL 105 11/02/2012 1236   CL 107 01/14/2012 0935   CO2 30* 11/02/2012 1236   CO2 29 01/14/2012 0935   BUN 11.5 11/02/2012 1236   BUN 11 01/14/2012 0935   CREATININE 0.8 11/02/2012 1236   CREATININE 0.83 01/14/2012 0935      Component Value Date/Time   CALCIUM 9.5 11/02/2012 1236   CALCIUM 9.5 01/14/2012 0935   ALKPHOS 79 11/02/2012 1236   ALKPHOS 87 01/14/2012 0935   AST 15 11/02/2012 1236   AST 18 01/14/2012 0935   ALT 13 11/02/2012 1236   ALT 14 01/14/2012 0935   BILITOT 0.44 11/02/2012 1236   BILITOT 0.7 01/14/2012 0935     Results for [name] Mathews, [name] Mathews (MRN [id number]) as of 11/02/2012 13:23  Ref. Range 04/29/2012 12:57 05/28/2012 14:44 07/01/2012 13:11 08/12/2012 12:47 09/16/2012 14:19  PSA Latest Range: <=4.00 ng/mL 3.63 3.39 2.82 2.44 2.34     Impression and Plan: This is a pleasant 67 year old gentleman with the following issues:  1. 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 He has an elevated PSA up to 33 in May of 2013. PSA dropped to 2.34 now. The plan is to continue with therapy with the same dose or schedule for now.   2. Bone directed therapy. On Xgeva with calcium supplementation. Also talked to him about complications associated with that which would include osteonecrosis of the jaw and I continued to educated him about dental hygiene.   3.  Androgen deprivation: He is to continue Lupron under the care of Dr. Brunilda Payor.   4. Follow-up: In 4-5 weeks.    Clenton Pare 5/13/20142:37 PM

## 2012-11-04 DIAGNOSIS — R0902 Hypoxemia: Secondary | ICD-10-CM | POA: Diagnosis not present

## 2012-11-04 DIAGNOSIS — Z6829 Body mass index (BMI) 29.0-29.9, adult: Secondary | ICD-10-CM | POA: Diagnosis not present

## 2012-11-04 DIAGNOSIS — G4710 Hypersomnia, unspecified: Secondary | ICD-10-CM | POA: Diagnosis not present

## 2012-11-04 DIAGNOSIS — G4730 Sleep apnea, unspecified: Secondary | ICD-10-CM | POA: Diagnosis not present

## 2012-11-16 DIAGNOSIS — E538 Deficiency of other specified B group vitamins: Secondary | ICD-10-CM | POA: Diagnosis not present

## 2012-11-29 DIAGNOSIS — D2310 Other benign neoplasm of skin of unspecified eyelid, including canthus: Secondary | ICD-10-CM | POA: Diagnosis not present

## 2012-11-29 DIAGNOSIS — H2510 Age-related nuclear cataract, unspecified eye: Secondary | ICD-10-CM | POA: Diagnosis not present

## 2012-11-30 DIAGNOSIS — IMO0002 Reserved for concepts with insufficient information to code with codable children: Secondary | ICD-10-CM | POA: Diagnosis not present

## 2012-11-30 DIAGNOSIS — G2581 Restless legs syndrome: Secondary | ICD-10-CM | POA: Diagnosis not present

## 2012-11-30 DIAGNOSIS — G609 Hereditary and idiopathic neuropathy, unspecified: Secondary | ICD-10-CM | POA: Diagnosis not present

## 2012-11-30 DIAGNOSIS — G2589 Other specified extrapyramidal and movement disorders: Secondary | ICD-10-CM | POA: Diagnosis not present

## 2012-12-02 ENCOUNTER — Encounter: Payer: Self-pay | Admitting: *Deleted

## 2012-12-02 NOTE — Progress Notes (Signed)
RECEIVED A FAX FROM [facility] CONCERNING A CONFIRMATION OF PRESCRIPTION SHIPMENT FOR ZYTIGA ON 12/01/12.

## 2012-12-08 ENCOUNTER — Telehealth: Payer: Self-pay | Admitting: Oncology

## 2012-12-08 ENCOUNTER — Other Ambulatory Visit (HOSPITAL_BASED_OUTPATIENT_CLINIC_OR_DEPARTMENT_OTHER): Payer: Medicare Other | Admitting: Lab

## 2012-12-08 ENCOUNTER — Ambulatory Visit (HOSPITAL_BASED_OUTPATIENT_CLINIC_OR_DEPARTMENT_OTHER): Payer: Medicare Other | Admitting: Oncology

## 2012-12-08 VITALS — BP 147/67 | HR 58 | Temp 98.5°F | Resp 18 | Ht 62.5 in | Wt 181.3 lb

## 2012-12-08 DIAGNOSIS — C7952 Secondary malignant neoplasm of bone marrow: Secondary | ICD-10-CM | POA: Diagnosis not present

## 2012-12-08 DIAGNOSIS — C7951 Secondary malignant neoplasm of bone: Secondary | ICD-10-CM | POA: Diagnosis not present

## 2012-12-08 DIAGNOSIS — C61 Malignant neoplasm of prostate: Secondary | ICD-10-CM

## 2012-12-08 LAB — COMPREHENSIVE METABOLIC PANEL (CC13)
AST: 19 U/L (ref 5–34)
Albumin: 3.3 g/dL — ABNORMAL LOW (ref 3.5–5.0)
Alkaline Phosphatase: 86 U/L (ref 40–150)
BUN: 9.2 mg/dL (ref 7.0–26.0)
Creatinine: 0.8 mg/dL (ref 0.7–1.3)
Potassium: 4.4 mEq/L (ref 3.5–5.1)
Total Bilirubin: 0.43 mg/dL (ref 0.20–1.20)

## 2012-12-08 LAB — CBC WITH DIFFERENTIAL/PLATELET
Basophils Absolute: 0 10*3/uL (ref 0.0–0.1)
EOS%: 0.2 % (ref 0.0–7.0)
HGB: 12.4 g/dL — ABNORMAL LOW (ref 13.0–17.1)
MCH: 26.8 pg — ABNORMAL LOW (ref 27.2–33.4)
MCV: 81.9 fL (ref 79.3–98.0)
MONO%: 4.1 % (ref 0.0–14.0)
RBC: 4.62 10*6/uL (ref 4.20–5.82)
RDW: 15.1 % — ABNORMAL HIGH (ref 11.0–14.6)

## 2012-12-08 MED ORDER — DENOSUMAB 120 MG/1.7ML ~~LOC~~ SOLN
120.0000 mg | Freq: Once | SUBCUTANEOUS | Status: AC
  Administered 2012-12-08: 120 mg via SUBCUTANEOUS
  Filled 2012-12-08: qty 1.7

## 2012-12-08 NOTE — Telephone Encounter (Signed)
gv and printed appt sched and avs for pt  °

## 2012-12-08 NOTE — Progress Notes (Signed)
Hematology and Oncology Follow Up Visit  [name] Mathews [id number] 07-13-1945 67 y.o. 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 He was inially diagnosed  2001, gleason score 4+5= 9.  Prior Therapy: 1. 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 He was started on hormone therapy, initially with Lupron   2. Due to  A rise in his PSA Casodex was added to firmagon in 2009.  3. Most recently, his PSA was up to 16, with castrate level testosterone and bone involvement based on a bone scan on 08/2101.  Current therapy:  Zytiga 1000 mg po daily started in 09/2011 for a PSA of 22. He is on Prednisone 5 mg twice a day with excellent response, PSA dropping from 33 to 2.34. Xgeva 120 mg started on 10/31/2011. Repeated every visit.  Lupron given by Dr. Brunilda Payor.   Interim History: Mr. Pieper presents today for a follow up visit. He continues to be on Zytiga and tolerating this well. He reports no symptoms at this point. He reports no bone pain, no GU symptoms. No abdominal pain, nausea, or vomiting. He has no fluid retention or leg swelling. He has not had not had any back pain, not had any shoulder pain, had not had any major changes in his performance status. He has continued to have excellent quality of life. He has continued to golf regularly and really had not had any major changes in his performance status and not had any constitutional symptoms. He tolerating Xgeva well with out complications.  He is not sleeping well when he takes Prednisone late.   Medications: I have reviewed the patient's current medications. Current Outpatient Prescriptions  Medication Sig Dispense Refill  . abiraterone Acetate (ZYTIGA) 250 MG tablet Take 4 tablets (1,000 mg total)  by mouth daily. Take on an empty stomach 1 hour before or 2 hours after a meal  120 tablet  0  . aspirin 81 MG tablet Take 81 mg by mouth daily.      [name] atorvastatin (LIPITOR) 10 MG tablet Take 10 mg by mouth daily.      . Calcium Carbonate (CALTRATE 600 PO) Take 600 mg by mouth 3 (three) times daily.      . cyanocobalamin 1000 MCG tablet Inject 1,000 mcg into the muscle every 30 (thirty) days.      [name] denosumab (XGEVA) 120 MG/1.7ML SOLN Inject 120 mg into the skin every 30 (thirty) days.      . DULoxetine (CYMBALTA) 30 MG capsule Take 30 mg by mouth daily.      . fish oil-omega-3 fatty acids 1000 MG capsule Take 1 g by mouth daily.      . predniSONE (DELTASONE) 5 MG tablet Take 1 tablet (5 mg total) by mouth 2 (two) times daily.  60 tablet  1  . rOPINIRole (REQUIP) 2 MG tablet Take 2 mg by mouth 2 (two) times daily.      . solifenacin (VESICARE) 5 MG tablet Take 10 mg by mouth daily.       No current facility-administered medications for this visit.     Allergies: No Known Allergies  Past Medical History, Surgical history, Social history, and Family History were reviewed and updated.  Review of Systems: Constitutional:  Negative for fever, chills, night  sweats, anorexia, weight loss, pain. Cardiovascular: negative Respiratory: negative Neurological: negative Dermatological: negative ENT: negative Skin: Negative. Gastrointestinal: negative Genito-Urinary: negative Hematological and Lymphatic: negative Breast: negative Musculoskeletal: negative Remaining ROS negative.  Physical Exam: Blood pressure 147/67, pulse 58, temperature 98.5 F (36.9 C), temperature source Oral, resp. rate 18, height 5' 2.5" (1.588 m), weight 181 lb 4.8 oz (82.237 kg), SpO2 100.00%. ECOG: 0 General appearance: alert Head: Normocephalic, without obvious abnormality, atraumatic Neck: no adenopathy, no carotid bruit, no JVD, supple, symmetrical, trachea midline and thyroid not enlarged, symmetric, no  tenderness/mass/nodules Lymph nodes: Cervical, supraclavicular, and axillary nodes normal. Heart:regular rate and rhythm, S1, S2 normal, no murmur, click, rub or gallop Lung:chest clear, no wheezing, rales, normal symmetric air entry Abdomen: soft, non-tender, without masses or organomegaly EXT:no erythema, induration, or nodules  Lab Results: Lab Results  Component Value Date   WBC 9.5 12/08/2012   HGB 12.4* 12/08/2012   HCT 37.8* 12/08/2012   MCV 81.9 12/08/2012   PLT 207 12/08/2012     Chemistry      Component Value Date/Time   NA 142 11/02/2012 1236   NA 143 01/14/2012 0935   K 4.3 11/02/2012 1236   K 4.4 01/14/2012 0935   CL 105 11/02/2012 1236   CL 107 01/14/2012 0935   CO2 30* 11/02/2012 1236   CO2 29 01/14/2012 0935   BUN 11.5 11/02/2012 1236   BUN 11 01/14/2012 0935   CREATININE 0.8 11/02/2012 1236   CREATININE 0.83 01/14/2012 0935      Component Value Date/Time   CALCIUM 9.5 11/02/2012 1236   CALCIUM 9.5 01/14/2012 0935   ALKPHOS 79 11/02/2012 1236   ALKPHOS 87 01/14/2012 0935   AST 15 11/02/2012 1236   AST 18 01/14/2012 0935   ALT 13 11/02/2012 1236   ALT 14 01/14/2012 0935   BILITOT 0.44 11/02/2012 1236   BILITOT 0.7 01/14/2012 0935     Results for [name] Mathews (MRN [id number]) as of 12/08/2012 15:19  Ref. Range 09/16/2012 14:19 11/02/2012 12:36  PSA Latest Range: <=4.00 ng/mL 2.34 3.53      Impression and Plan: This is a pleasant 67 year old gentleman with the following issues:  1. 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 He has an elevated PSA up to 33 in May of 2013. PSA dropped to 2.34 and now slightly up to 3.53. His PSA overall showing minor change and as long as PSA show rapid rise, we will continue with therapy with the same dose and schedule for now.   2. Bone directed therapy. On Xgeva with calcium supplementation. Also talked to him about complications associated with that which would include  osteonecrosis of the jaw and I continued to educated him about dental hygiene.   3. Androgen deprivation: He is to continue Lupron under the care of Dr. Brunilda Payor.   4. Follow-up: In 4-5 weeks.    [facility] 6/18/20143:28 PM

## 2012-12-09 ENCOUNTER — Telehealth: Payer: Self-pay | Admitting: *Deleted

## 2012-12-09 DIAGNOSIS — E538 Deficiency of other specified B group vitamins: Secondary | ICD-10-CM | POA: Diagnosis not present

## 2012-12-09 NOTE — Telephone Encounter (Signed)
Message copied by Reesa Chew on Thu Dec 09, 2012  1:36 PM ------      Message from: Myrtis Ser      Created: Thu Dec 09, 2012 11:07 AM       Please call PSA. ------

## 2012-12-09 NOTE — Telephone Encounter (Signed)
Spoke with wife Aram Beecham, gave results of PSA done yesterday./

## 2012-12-25 DIAGNOSIS — R51 Headache: Secondary | ICD-10-CM | POA: Diagnosis not present

## 2012-12-25 DIAGNOSIS — Z8669 Personal history of other diseases of the nervous system and sense organs: Secondary | ICD-10-CM | POA: Diagnosis not present

## 2012-12-25 DIAGNOSIS — Z7982 Long term (current) use of aspirin: Secondary | ICD-10-CM | POA: Diagnosis not present

## 2012-12-25 DIAGNOSIS — R52 Pain, unspecified: Secondary | ICD-10-CM | POA: Diagnosis not present

## 2012-12-25 DIAGNOSIS — Z79899 Other long term (current) drug therapy: Secondary | ICD-10-CM | POA: Diagnosis not present

## 2012-12-27 DIAGNOSIS — D485 Neoplasm of uncertain behavior of skin: Secondary | ICD-10-CM | POA: Diagnosis not present

## 2012-12-27 DIAGNOSIS — D2310 Other benign neoplasm of skin of unspecified eyelid, including canthus: Secondary | ICD-10-CM | POA: Diagnosis not present

## 2012-12-27 DIAGNOSIS — D492 Neoplasm of unspecified behavior of bone, soft tissue, and skin: Secondary | ICD-10-CM | POA: Diagnosis not present

## 2013-01-06 DIAGNOSIS — G589 Mononeuropathy, unspecified: Secondary | ICD-10-CM | POA: Diagnosis not present

## 2013-01-06 DIAGNOSIS — R7309 Other abnormal glucose: Secondary | ICD-10-CM | POA: Diagnosis not present

## 2013-01-06 DIAGNOSIS — E538 Deficiency of other specified B group vitamins: Secondary | ICD-10-CM | POA: Diagnosis not present

## 2013-01-06 DIAGNOSIS — E7800 Pure hypercholesterolemia, unspecified: Secondary | ICD-10-CM | POA: Diagnosis not present

## 2013-01-06 DIAGNOSIS — I10 Essential (primary) hypertension: Secondary | ICD-10-CM | POA: Diagnosis not present

## 2013-01-06 DIAGNOSIS — D126 Benign neoplasm of colon, unspecified: Secondary | ICD-10-CM | POA: Diagnosis not present

## 2013-01-06 DIAGNOSIS — G4733 Obstructive sleep apnea (adult) (pediatric): Secondary | ICD-10-CM | POA: Diagnosis not present

## 2013-01-11 ENCOUNTER — Ambulatory Visit (HOSPITAL_BASED_OUTPATIENT_CLINIC_OR_DEPARTMENT_OTHER): Payer: Medicare Other | Admitting: Oncology

## 2013-01-11 ENCOUNTER — Telehealth: Payer: Self-pay | Admitting: Oncology

## 2013-01-11 ENCOUNTER — Other Ambulatory Visit (HOSPITAL_BASED_OUTPATIENT_CLINIC_OR_DEPARTMENT_OTHER): Payer: Medicare Other | Admitting: Lab

## 2013-01-11 VITALS — BP 167/83 | HR 59 | Temp 98.7°F | Resp 18 | Ht 62.0 in | Wt 177.8 lb

## 2013-01-11 DIAGNOSIS — C61 Malignant neoplasm of prostate: Secondary | ICD-10-CM

## 2013-01-11 DIAGNOSIS — C7951 Secondary malignant neoplasm of bone: Secondary | ICD-10-CM

## 2013-01-11 DIAGNOSIS — E291 Testicular hypofunction: Secondary | ICD-10-CM | POA: Diagnosis not present

## 2013-01-11 DIAGNOSIS — C7952 Secondary malignant neoplasm of bone marrow: Secondary | ICD-10-CM

## 2013-01-11 LAB — CBC WITH DIFFERENTIAL/PLATELET
BASO%: 0.3 % (ref 0.0–2.0)
Basophils Absolute: 0 10e3/uL (ref 0.0–0.1)
EOS%: 1.3 % (ref 0.0–7.0)
Eosinophils Absolute: 0.1 10e3/uL (ref 0.0–0.5)
HCT: 38 % — ABNORMAL LOW (ref 38.4–49.9)
HGB: 12.4 g/dL — ABNORMAL LOW (ref 13.0–17.1)
LYMPH%: 26 % (ref 14.0–49.0)
MCH: 26.8 pg — ABNORMAL LOW (ref 27.2–33.4)
MCHC: 32.6 g/dL (ref 32.0–36.0)
MCV: 82.3 fL (ref 79.3–98.0)
MONO#: 0.7 10e3/uL (ref 0.1–0.9)
MONO%: 6.7 % (ref 0.0–14.0)
NEUT#: 6.7 10e3/uL — ABNORMAL HIGH (ref 1.5–6.5)
NEUT%: 65.7 % (ref 39.0–75.0)
Platelets: 220 10e3/uL (ref 140–400)
RBC: 4.62 10e6/uL (ref 4.20–5.82)
RDW: 14.9 % — ABNORMAL HIGH (ref 11.0–14.6)
WBC: 10.2 10e3/uL (ref 4.0–10.3)
lymph#: 2.6 10e3/uL (ref 0.9–3.3)

## 2013-01-11 LAB — COMPREHENSIVE METABOLIC PANEL (CC13)
Alkaline Phosphatase: 87 U/L (ref 40–150)
BUN: 10.6 mg/dL (ref 7.0–26.0)
CO2: 30 mEq/L — ABNORMAL HIGH (ref 22–29)
Glucose: 89 mg/dl (ref 70–140)
Total Bilirubin: 0.49 mg/dL (ref 0.20–1.20)

## 2013-01-11 MED ORDER — DENOSUMAB 120 MG/1.7ML ~~LOC~~ SOLN
120.0000 mg | Freq: Once | SUBCUTANEOUS | Status: AC
  Administered 2013-01-11: 120 mg via SUBCUTANEOUS
  Filled 2013-01-11: qty 1.7

## 2013-01-11 NOTE — Progress Notes (Signed)
Hematology and Oncology Follow Up Visit  [name] Mathews [id number] May 03, 1946 67 y.o. 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 He was inially diagnosed  2001, gleason score 4+5= 9.  Prior Therapy: 1. 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 He was started on hormone therapy, initially with Lupron   2. Due to  A rise in his PSA Casodex was added to firmagon in 2009.  3. Most recently, his PSA was up to 16, with castrate level testosterone and bone involvement based on a bone scan on 08/2101.  Current therapy:  Zytiga 1000 mg po daily started in 09/2011 for a PSA of 22. He is on Prednisone 5 mg twice a day with excellent response, PSA dropping from 33 to 2.34. Xgeva 120 mg started on 10/31/2011. Repeated every visit.  Lupron given by Dr. Brunilda Mathews.   Interim History: [name] presents today for a follow up visit. He continues to be on Zytiga and tolerating this well. He reports no symptoms at this point. He reports no bone pain, no GU symptoms. No abdominal pain, nausea, or vomiting. He has no fluid retention or leg swelling. He has not had not had any back pain, not had any shoulder pain, had not had any major changes in his performance status. He has continued to have excellent quality of life. He has not had any major changes in his performance status and not had any constitutional symptoms. He tolerating Xgeva well with out complications.  He is able to drive and plays golf regularly. No new pains noted since the last visit.   Medications: I have reviewed the patient's current medications. Current Outpatient Prescriptions  Medication Sig Dispense Refill  . abiraterone Acetate (ZYTIGA) 250 MG tablet Take 4 tablets (1,000 mg total) by mouth  daily. Take on an empty stomach 1 hour before or 2 hours after a meal  120 tablet  0  . aspirin 81 MG tablet Take 81 mg by mouth daily.      [name] atorvastatin (LIPITOR) 10 MG tablet Take 10 mg by mouth daily.      . Calcium Carbonate (CALTRATE 600 PO) Take 600 mg by mouth 3 (three) times daily.      . cyanocobalamin 1000 MCG tablet Inject 1,000 mcg into the muscle every 30 (thirty) days.      [name] denosumab (XGEVA) 120 MG/1.7ML SOLN Inject 120 mg into the skin every 30 (thirty) days.      . DULoxetine (CYMBALTA) 30 MG capsule Take 30 mg by mouth daily.      . fish oil-omega-3 fatty acids 1000 MG capsule Take 1 g by mouth daily.      . predniSONE (DELTASONE) 5 MG tablet Take 1 tablet (5 mg total) by mouth 2 (two) times daily.  60 tablet  1  . rOPINIRole (REQUIP) 2 MG tablet Take 2 mg by mouth 2 (two) times daily.      . solifenacin (VESICARE) 5 MG tablet Take 10 mg by mouth daily.       Current Facility-Administered Medications  Medication Dose Route Frequency Provider Last Rate Last Dose  . denosumab (XGEVA) injection 120 mg  120 mg Subcutaneous Once Myrtis Ser, NP         Allergies: No Known Allergies  Past Medical History, Surgical history, Social history, and Family History were reviewed and updated.  Review of Systems: Constitutional:  Negative for fever, chills, night sweats, anorexia, weight loss, pain. Cardiovascular: negative Respiratory: negative Neurological: negative Dermatological: negative ENT: negative Skin: Negative. Gastrointestinal: negative Genito-Urinary: negative Hematological and Lymphatic: negative Breast: negative Musculoskeletal: negative Remaining ROS negative.  Physical Exam: Blood pressure 167/83, pulse 59, temperature 98.7 F (37.1 C), temperature source Oral, resp. rate 18, height 5\' 2"  (1.575 m), weight 177 lb 12.8 oz (80.65 kg). ECOG: 0 General appearance: alert Head: Normocephalic, without obvious abnormality, atraumatic Neck: no adenopathy,  no carotid bruit, no JVD, supple, symmetrical, trachea midline and thyroid not enlarged, symmetric, no tenderness/mass/nodules Lymph nodes: Cervical, supraclavicular, and axillary nodes normal. Heart:regular rate and rhythm, S1, S2 normal, no murmur, click, rub or gallop Lung:chest clear, no wheezing, rales, normal symmetric air entry Abdomen: soft, non-tender, without masses or organomegaly EXT:no erythema, induration, or nodules  Lab Results: Lab Results  Component Value Date   WBC 10.2 01/11/2013   HGB 12.4* 01/11/2013   HCT 38.0* 01/11/2013   MCV 82.3 01/11/2013   PLT 220 01/11/2013     Chemistry      Component Value Date/Time   NA 144 12/08/2012 1504   NA 143 01/14/2012 0935   K 4.4 12/08/2012 1504   K 4.4 01/14/2012 0935   CL 105 12/08/2012 1504   CL 107 01/14/2012 0935   CO2 30* 12/08/2012 1504   CO2 29 01/14/2012 0935   BUN 9.2 12/08/2012 1504   BUN 11 01/14/2012 0935   CREATININE 0.8 12/08/2012 1504   CREATININE 0.83 01/14/2012 0935      Component Value Date/Time   CALCIUM 9.4 12/08/2012 1504   CALCIUM 9.5 01/14/2012 0935   ALKPHOS 86 12/08/2012 1504   ALKPHOS 87 01/14/2012 0935   AST 19 12/08/2012 1504   AST 18 01/14/2012 0935   ALT 12 12/08/2012 1504   ALT 14 01/14/2012 0935   BILITOT 0.43 12/08/2012 1504   BILITOT 0.7 01/14/2012 0935      Results for [name], Mathews (MRN [id number]) as of 01/11/2013 08:17  Ref. Range 11/02/2012 12:36 12/08/2012 15:04  PSA Latest Range: <=4.00 ng/mL 3.53 2.40      Impression and Plan: This is a pleasant 67 year old gentleman with the following issues:  1. 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 He has an elevated PSA up to 33 in May of 2013. PSA dropped to 2.40. We will continue with therapy with the same dose and schedule for now given that his PSA remains under control.   2. Bone directed therapy. On Xgeva with calcium supplementation. Also talked to him about complications  associated with that which would include osteonecrosis of the jaw and I continued to educated him about dental hygiene.   3. Androgen deprivation: He is to continue Lupron under the care of Dr. Brunilda Mathews.   4. Follow-up: In 4-5 weeks.    Genine Beckett 7/22/20148:36 AM

## 2013-01-11 NOTE — Telephone Encounter (Signed)
Gave pt appt for ML and lab on 02/22/13

## 2013-01-12 ENCOUNTER — Telehealth: Payer: Self-pay | Admitting: *Deleted

## 2013-01-12 NOTE — Telephone Encounter (Signed)
Called patient with results of PSA done yesterday 

## 2013-01-12 NOTE — Telephone Encounter (Signed)
Message copied by Reesa Chew on Wed Jan 12, 2013  9:46 AM ------      Message from: Benjiman Core      Created: Wed Jan 12, 2013  8:02 AM       Please call PSA. No change in his medication. PSA still down. ------

## 2013-01-26 ENCOUNTER — Other Ambulatory Visit: Payer: Self-pay

## 2013-02-01 DIAGNOSIS — E538 Deficiency of other specified B group vitamins: Secondary | ICD-10-CM | POA: Diagnosis not present

## 2013-02-02 DIAGNOSIS — C61 Malignant neoplasm of prostate: Secondary | ICD-10-CM | POA: Diagnosis not present

## 2013-02-07 ENCOUNTER — Encounter: Payer: Self-pay | Admitting: *Deleted

## 2013-02-07 NOTE — Progress Notes (Signed)
RECEIVED A FAX FROM [facility] CONCERNING A CONFIRMATION OF PRESCRIPTION SHIPMENT FOR ZYTIGA ON 02/04/13.

## 2013-02-15 DIAGNOSIS — Z86010 Personal history of colonic polyps: Secondary | ICD-10-CM | POA: Diagnosis not present

## 2013-02-16 DIAGNOSIS — IMO0002 Reserved for concepts with insufficient information to code with codable children: Secondary | ICD-10-CM | POA: Diagnosis not present

## 2013-02-16 DIAGNOSIS — G2589 Other specified extrapyramidal and movement disorders: Secondary | ICD-10-CM | POA: Diagnosis not present

## 2013-02-16 DIAGNOSIS — G2581 Restless legs syndrome: Secondary | ICD-10-CM | POA: Diagnosis not present

## 2013-02-16 DIAGNOSIS — G609 Hereditary and idiopathic neuropathy, unspecified: Secondary | ICD-10-CM | POA: Diagnosis not present

## 2013-02-17 ENCOUNTER — Telehealth: Payer: Self-pay | Admitting: Oncology

## 2013-02-22 ENCOUNTER — Ambulatory Visit: Payer: Medicare Other | Admitting: Oncology

## 2013-02-22 ENCOUNTER — Ambulatory Visit: Payer: Medicare Other

## 2013-02-22 ENCOUNTER — Ambulatory Visit (HOSPITAL_BASED_OUTPATIENT_CLINIC_OR_DEPARTMENT_OTHER): Payer: Medicare Other | Admitting: Oncology

## 2013-02-22 ENCOUNTER — Other Ambulatory Visit (HOSPITAL_BASED_OUTPATIENT_CLINIC_OR_DEPARTMENT_OTHER): Payer: Medicare Other

## 2013-02-22 ENCOUNTER — Telehealth: Payer: Self-pay | Admitting: Oncology

## 2013-02-22 ENCOUNTER — Other Ambulatory Visit: Payer: Medicare Other | Admitting: Lab

## 2013-02-22 VITALS — BP 128/66 | HR 70 | Temp 98.4°F | Resp 18 | Ht 62.0 in | Wt 177.7 lb

## 2013-02-22 DIAGNOSIS — E291 Testicular hypofunction: Secondary | ICD-10-CM

## 2013-02-22 DIAGNOSIS — C7951 Secondary malignant neoplasm of bone: Secondary | ICD-10-CM

## 2013-02-22 DIAGNOSIS — C61 Malignant neoplasm of prostate: Secondary | ICD-10-CM

## 2013-02-22 LAB — CBC WITH DIFFERENTIAL/PLATELET
Basophils Absolute: 0 10*3/uL (ref 0.0–0.1)
EOS%: 1.3 % (ref 0.0–7.0)
HGB: 12.3 g/dL — ABNORMAL LOW (ref 13.0–17.1)
MCH: 27.3 pg (ref 27.2–33.4)
MCHC: 33.5 g/dL (ref 32.0–36.0)
MCV: 81.7 fL (ref 79.3–98.0)
MONO%: 6.7 % (ref 0.0–14.0)
RDW: 14.6 % (ref 11.0–14.6)

## 2013-02-22 LAB — COMPREHENSIVE METABOLIC PANEL (CC13)
AST: 16 U/L (ref 5–34)
Albumin: 3.2 g/dL — ABNORMAL LOW (ref 3.5–5.0)
Alkaline Phosphatase: 78 U/L (ref 40–150)
BUN: 11.8 mg/dL (ref 7.0–26.0)
Creatinine: 0.9 mg/dL (ref 0.7–1.3)
Potassium: 3.8 mEq/L (ref 3.5–5.1)

## 2013-02-22 MED ORDER — DENOSUMAB 120 MG/1.7ML ~~LOC~~ SOLN
120.0000 mg | Freq: Once | SUBCUTANEOUS | Status: AC
  Administered 2013-02-22: 120 mg via SUBCUTANEOUS
  Filled 2013-02-22: qty 1.7

## 2013-02-22 NOTE — Telephone Encounter (Signed)
gv pt appt schedule for October.  °

## 2013-02-22 NOTE — Progress Notes (Signed)
Hematology and Oncology Follow Up Visit  [name] Mathews [id number] May 18, 1946 67 y.o. 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 He was inially diagnosed  2001, gleason score 4+5= 9.  Prior Therapy: 1. 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 He was started on hormone therapy, initially with Lupron   2. Due to  A rise in his PSA Casodex was added to firmagon in 2009.  3. Most recently, his PSA was up to 16, with castrate level testosterone and bone involvement based on a bone scan on 08/2101.  Current therapy:  Zytiga 1000 mg po daily started in 09/2011 for a PSA of 22. He is on Prednisone 5 mg twice a day with excellent response, PSA dropping from 33 to 2.34. Xgeva 120 mg started on 10/31/2011. Repeated every visit.  Lupron given by Dr. Brunilda Payor.   Interim History: [name] Mathews presents today for a follow up visit. He continues to be on Zytiga and tolerating this well. He reports no symptoms at this point. He reports no bone pain, no GU symptoms. No abdominal pain, nausea, or vomiting. He has no fluid retention or leg swelling. He has not had not had any back pain, not had any shoulder pain, had not had any major changes in his performance status. He has continued to have excellent quality of life. He has not had any major changes in his performance status and not had any constitutional symptoms. He tolerating Xgeva well with out complications.  No new issues since the last visit.    Medications: I have reviewed the patient's current medications. Current Outpatient Prescriptions  Medication Sig Dispense Refill  . abiraterone Acetate (ZYTIGA) 250 MG tablet Take 4 tablets (1,000 mg total) by mouth daily. Take on an empty stomach 1 hour before or 2  hours after a meal  120 tablet  0  . aspirin 81 MG tablet Take 81 mg by mouth daily.      [name] atorvastatin (LIPITOR) 10 MG tablet Take 10 mg by mouth daily.      . Calcium Carbonate (CALTRATE 600 PO) Take 600 mg by mouth 3 (three) times daily.      . cyanocobalamin 1000 MCG tablet Inject 1,000 mcg into the muscle every 30 (thirty) days.      [name] denosumab (XGEVA) 120 MG/1.7ML SOLN Inject 120 mg into the skin every 30 (thirty) days.      . DULoxetine (CYMBALTA) 30 MG capsule Take 30 mg by mouth daily.      . fish oil-omega-3 fatty acids 1000 MG capsule Take 1 g by mouth daily.      . predniSONE (DELTASONE) 5 MG tablet Take 1 tablet (5 mg total) by mouth 2 (two) times daily.  60 tablet  1  . rOPINIRole (REQUIP) 2 MG tablet Take 2 mg by mouth 2 (two) times daily.      . solifenacin (VESICARE) 5 MG tablet Take 10 mg by mouth daily.       No current facility-administered medications for this visit.     Allergies: No Known Allergies  Past Medical History, Surgical history, Social history, and Family History were reviewed and updated.  Review of Systems:  Remaining ROS negative.  Physical Exam: Blood pressure 128/66, pulse 70, temperature 98.4 F (36.9  C), temperature source Oral, resp. rate 18, height 5\' 2"  (1.575 m), weight 177 lb 11.2 oz (80.604 kg). ECOG: 0 General appearance: alert Head: Normocephalic, without obvious abnormality, atraumatic Neck: no adenopathy, no carotid bruit, no JVD, supple, symmetrical, trachea midline and thyroid not enlarged, symmetric, no tenderness/mass/nodules Lymph nodes: Cervical, supraclavicular, and axillary nodes normal. Heart:regular rate and rhythm, S1, S2 normal, no murmur, click, rub or gallop Lung:chest clear, no wheezing, rales, normal symmetric air entry Abdomen: soft, non-tender, without masses or organomegaly EXT:no erythema, induration, or nodules  Lab Results: Lab Results  Component Value Date   WBC 9.6 02/22/2013   HGB 12.3* 02/22/2013   HCT  36.8* 02/22/2013   MCV 81.7 02/22/2013   PLT 220 02/22/2013     Chemistry      Component Value Date/Time   NA 145 01/11/2013 0808   NA 143 01/14/2012 0935   K 3.8 01/11/2013 0808   K 4.4 01/14/2012 0935   CL 105 12/08/2012 1504   CL 107 01/14/2012 0935   CO2 30* 01/11/2013 0808   CO2 29 01/14/2012 0935   BUN 10.6 01/11/2013 0808   BUN 11 01/14/2012 0935   CREATININE 0.8 01/11/2013 0808   CREATININE 0.83 01/14/2012 0935      Component Value Date/Time   CALCIUM 9.4 01/11/2013 0808   CALCIUM 9.5 01/14/2012 0935   ALKPHOS 87 01/11/2013 0808   ALKPHOS 87 01/14/2012 0935   AST 17 01/11/2013 0808   AST 18 01/14/2012 0935   ALT 11 01/11/2013 0808   ALT 14 01/14/2012 0935   BILITOT 0.49 01/11/2013 0808   BILITOT 0.7 01/14/2012 0935       Results for [name] Mathews, [name] Mathews (MRN [id number]) as of 02/22/2013 10:28  Ref. Range 07/01/2012 13:11 08/12/2012 12:47 09/16/2012 14:19 11/02/2012 12:36 12/08/2012 15:04 01/11/2013 08:08  PSA Latest Range: <=4.00 ng/mL 2.82 2.44 2.34 3.53 2.40 2.56      Impression and Plan: This is a pleasant 67 year old gentleman with the following issues:  1. 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 He has an elevated PSA up to 33 in May of 2013. PSA dropped to 2.40. PSA has been stable around 2.4 to 2.5. We will continue with therapy with the same dose and schedule for now given that his PSA remains under control.   2. Bone directed therapy. On Xgeva with calcium supplementation. Also talked to him about complications associated with that which would include osteonecrosis of the jaw and I continued to educated him about dental hygiene.   3. Androgen deprivation: He is to continue Lupron under the care of Dr. Brunilda Payor.   4. Follow-up: In 4-5 weeks.    [name] Mathews 9/2/201410:37 AM

## 2013-03-02 ENCOUNTER — Telehealth: Payer: Self-pay | Admitting: *Deleted

## 2013-03-02 DIAGNOSIS — K5730 Diverticulosis of large intestine without perforation or abscess without bleeding: Secondary | ICD-10-CM | POA: Diagnosis not present

## 2013-03-02 DIAGNOSIS — K648 Other hemorrhoids: Secondary | ICD-10-CM | POA: Diagnosis not present

## 2013-03-02 DIAGNOSIS — Z86010 Personal history of colonic polyps: Secondary | ICD-10-CM | POA: Diagnosis not present

## 2013-03-02 DIAGNOSIS — C61 Malignant neoplasm of prostate: Secondary | ICD-10-CM

## 2013-03-02 DIAGNOSIS — D126 Benign neoplasm of colon, unspecified: Secondary | ICD-10-CM | POA: Diagnosis not present

## 2013-03-02 DIAGNOSIS — Z1211 Encounter for screening for malignant neoplasm of colon: Secondary | ICD-10-CM | POA: Diagnosis not present

## 2013-03-02 MED ORDER — ABIRATERONE ACETATE 250 MG PO TABS: 1000.0000 mg | ORAL_TABLET | Freq: Every day | ORAL | Status: DC

## 2013-03-07 NOTE — Telephone Encounter (Signed)
RECEIVED A FAX FROM [facility] CONCERNING A CONFIRMATION OF PRESCRIPTION SHIPMENT FOR ZYTIGA ON 03/04/13.

## 2013-03-16 ENCOUNTER — Encounter: Payer: Self-pay | Admitting: *Deleted

## 2013-03-16 NOTE — Progress Notes (Signed)
Faxed confirmation from [facility] received that Zytiga shipped 03/15/13 for next day delivery.

## 2013-03-17 DIAGNOSIS — G2581 Restless legs syndrome: Secondary | ICD-10-CM | POA: Diagnosis not present

## 2013-03-17 DIAGNOSIS — G2589 Other specified extrapyramidal and movement disorders: Secondary | ICD-10-CM | POA: Diagnosis not present

## 2013-03-17 DIAGNOSIS — IMO0002 Reserved for concepts with insufficient information to code with codable children: Secondary | ICD-10-CM | POA: Diagnosis not present

## 2013-03-17 DIAGNOSIS — G609 Hereditary and idiopathic neuropathy, unspecified: Secondary | ICD-10-CM | POA: Diagnosis not present

## 2013-04-06 ENCOUNTER — Telehealth: Payer: Self-pay | Admitting: Oncology

## 2013-04-06 ENCOUNTER — Other Ambulatory Visit (HOSPITAL_BASED_OUTPATIENT_CLINIC_OR_DEPARTMENT_OTHER): Payer: Medicare Other | Admitting: Lab

## 2013-04-06 ENCOUNTER — Ambulatory Visit (HOSPITAL_BASED_OUTPATIENT_CLINIC_OR_DEPARTMENT_OTHER): Payer: Medicare Other

## 2013-04-06 ENCOUNTER — Ambulatory Visit (HOSPITAL_BASED_OUTPATIENT_CLINIC_OR_DEPARTMENT_OTHER): Payer: Medicare Other | Admitting: Oncology

## 2013-04-06 VITALS — BP 132/79 | HR 71 | Temp 99.0°F | Resp 20 | Ht 62.0 in | Wt 181.7 lb

## 2013-04-06 DIAGNOSIS — C61 Malignant neoplasm of prostate: Secondary | ICD-10-CM

## 2013-04-06 DIAGNOSIS — C7951 Secondary malignant neoplasm of bone: Secondary | ICD-10-CM | POA: Diagnosis not present

## 2013-04-06 LAB — COMPREHENSIVE METABOLIC PANEL (CC13)
Albumin: 3.3 g/dL — ABNORMAL LOW (ref 3.5–5.0)
BUN: 13.6 mg/dL (ref 7.0–26.0)
CO2: 29 mEq/L (ref 22–29)
Calcium: 9.9 mg/dL (ref 8.4–10.4)
Chloride: 105 mEq/L (ref 98–109)
Glucose: 94 mg/dl (ref 70–140)
Potassium: 3.9 mEq/L (ref 3.5–5.1)
Total Protein: 7.1 g/dL (ref 6.4–8.3)

## 2013-04-06 LAB — CBC WITH DIFFERENTIAL/PLATELET
Basophils Absolute: 0.1 10*3/uL (ref 0.0–0.1)
Eosinophils Absolute: 0.1 10*3/uL (ref 0.0–0.5)
HGB: 12.4 g/dL — ABNORMAL LOW (ref 13.0–17.1)
MCV: 82.9 fL (ref 79.3–98.0)
MONO#: 0.7 10*3/uL (ref 0.1–0.9)
NEUT#: 6.3 10*3/uL (ref 1.5–6.5)
RDW: 14.5 % (ref 11.0–14.6)
WBC: 10.3 10*3/uL (ref 4.0–10.3)
lymph#: 3 10*3/uL (ref 0.9–3.3)

## 2013-04-06 LAB — PSA: PSA: 2.73 ng/mL (ref <=4.00)

## 2013-04-06 MED ORDER — DENOSUMAB 120 MG/1.7ML ~~LOC~~ SOLN
120.0000 mg | Freq: Once | SUBCUTANEOUS | Status: AC
  Administered 2013-04-06: 120 mg via SUBCUTANEOUS
  Filled 2013-04-06: qty 1.7

## 2013-04-06 NOTE — Telephone Encounter (Signed)
Gave pt appt for November lab and ML

## 2013-04-06 NOTE — Addendum Note (Signed)
Addended by: Reesa Chew on: 04/06/2013 10:27 AM   Modules accepted: Medications

## 2013-04-06 NOTE — Progress Notes (Signed)
Hematology and Oncology Follow Up Visit  LAMOUNT BANKSON [id number] 01/17/1946 67 y.o. 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 He was inially diagnosed  2001, gleason score 4+5= 9.  Prior Therapy: 1. 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 He was started on hormone therapy, initially with Lupron   2. Due to  A rise in his PSA Casodex was added to firmagon in 2009.  3. Most recently, his PSA was up to 16, with castrate level testosterone and bone involvement based on a bone scan on 08/2101.  Current therapy:  Zytiga 1000 mg po daily started in 09/2011 for a PSA of 22. He is on Prednisone 5 mg twice a day with excellent response, PSA dropping from 33 to 2.34. Xgeva 120 mg started on 10/31/2011. Repeated every visit.  Lupron given by Dr. Brunilda Payor.   Interim History: Mr. Wandel presents today for a follow up visit. He continues to be on Zytiga and tolerating this well. He reports no symptoms at this point. He reports no bone pain, no GU symptoms. No abdominal pain, nausea, or vomiting. He has no fluid retention or leg swelling. He has not had any shoulder pain, had not had any major changes in his performance status. He has continued to have excellent quality of life. He has not had any major changes in his performance status and not had any constitutional symptoms. He tolerating Xgeva well with out complications.  He is reporting a right-sided lower back pain associated with some pelvic pain. This is graded 2/10 and not associated with any neurological symptoms. His pain is intermittent and certainly not constant. He still able to ambulate and golf without any limitations. He has not had any medications to try to alleviate and he has reported that  his pain has been chronic in nature and not necessarily new.  Medications: I have reviewed the patient's current medications. Current Outpatient Prescriptions  Medication Sig Dispense Refill  . abiraterone Acetate (ZYTIGA) 250 MG tablet Take 4 tablets (1,000 mg total) by mouth daily. Take on an empty stomach 1 hour before or 2 hours after a meal  120 tablet  1  . aspirin 81 MG tablet Take 81 mg by mouth daily.      [name] atorvastatin (LIPITOR) 10 MG tablet Take 10 mg by mouth daily.      . Calcium Carbonate (CALTRATE 600 PO) Take 600 mg by mouth 3 (three) times daily.      . cyanocobalamin 1000 MCG tablet Inject 1,000 mcg into the muscle every 30 (thirty) days.      [name] denosumab (XGEVA) 120 MG/1.7ML SOLN Inject 120 mg into the skin every 30 (thirty) days.      . DULoxetine (CYMBALTA) 30 MG capsule Take 30 mg by mouth daily.      . fish oil-omega-3 fatty acids 1000 MG capsule Take 1 g by mouth daily.      . predniSONE (DELTASONE) 5 MG tablet Take 1 tablet (5 mg total) by mouth 2 (two) times daily.  60 tablet  1  . rOPINIRole (REQUIP) 2 MG tablet Take 2 mg by mouth 2 (two) times daily.      . solifenacin (VESICARE) 5 MG tablet Take 10 mg by mouth daily.  No current facility-administered medications for this visit.     Allergies: No Known Allergies  Past Medical History, Surgical history, Social history, and Family History were reviewed and updated.  Review of Systems:  Remaining ROS negative.  Physical Exam: Blood pressure 132/79, pulse 71, temperature 99 F (37.2 C), temperature source Oral, resp. rate 20, height 5\' 2"  (1.575 m), weight 181 lb 11.2 oz (82.419 kg). ECOG: 0 General appearance: alert Head: Normocephalic, without obvious abnormality, atraumatic Neck: no adenopathy, no carotid bruit, no JVD, supple, symmetrical, trachea midline and thyroid not enlarged, symmetric, no tenderness/mass/nodules Lymph nodes: Cervical, supraclavicular, and axillary nodes  normal. Heart:regular rate and rhythm, S1, S2 normal, no murmur, click, rub or gallop Lung:chest clear, no wheezing, rales, normal symmetric air entry Abdomen: soft, non-tender, without masses or organomegaly EXT:no erythema, induration, or nodules Neurological: No deficits noted.  Lab Results: Lab Results  Component Value Date   WBC 10.3 04/06/2013   HGB 12.4* 04/06/2013   HCT 38.0* 04/06/2013   MCV 82.9 04/06/2013   PLT 233 04/06/2013     Chemistry      Component Value Date/Time   NA 145 02/22/2013 1016   NA 143 01/14/2012 0935   K 3.8 02/22/2013 1016   K 4.4 01/14/2012 0935   CL 105 12/08/2012 1504   CL 107 01/14/2012 0935   CO2 28 02/22/2013 1016   CO2 29 01/14/2012 0935   BUN 11.8 02/22/2013 1016   BUN 11 01/14/2012 0935   CREATININE 0.9 02/22/2013 1016   CREATININE 0.83 01/14/2012 0935      Component Value Date/Time   CALCIUM 9.3 02/22/2013 1016   CALCIUM 9.5 01/14/2012 0935   ALKPHOS 78 02/22/2013 1016   ALKPHOS 87 01/14/2012 0935   AST 16 02/22/2013 1016   AST 18 01/14/2012 0935   ALT 10 02/22/2013 1016   ALT 14 01/14/2012 0935   BILITOT 0.46 02/22/2013 1016   BILITOT 0.7 01/14/2012 0935      Results for JAKEIM, SEDORE (MRN [id number]) as of 04/06/2013 10:13  Ref. Range 01/11/2013 08:08 02/22/2013 10:16  PSA Latest Range: <=4.00 ng/mL 2.56 2.68        Impression and Plan: This is a pleasant 67 year old gentleman with the following issues:  1. 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 He has an elevated PSA up to 33 in May of 2013. PSA dropped to 2.40. PSA has been stable around 2.68. We will continue with therapy with the same dose and schedule for now given that his PSA remains under control.   2. Bone directed therapy. On Xgeva with calcium supplementation. Also talked to him about complications associated with that which would include osteonecrosis of the jaw and I continued to educated him about dental hygiene.   3.  Androgen deprivation: He is to continue Lupron under the care of Dr. Brunilda Payor.   4. Back pain: Appears to be arthritic in nature although I cannot rule out cancer involvement. He does not have any neurological findings or anything to suggest progression of his cancer. I have instructed him to use anti-inflammatories medications and report to me any change in his clinical status as soon as possible.  5. Follow-up: In 4-5 weeks.    Casady Voshell 10/15/201410:20 AM

## 2013-04-07 ENCOUNTER — Telehealth: Payer: Self-pay | Admitting: *Deleted

## 2013-04-07 NOTE — Telephone Encounter (Signed)
Called patient with results of PSA done yesterday 

## 2013-04-07 NOTE — Telephone Encounter (Signed)
Message copied by Reesa Chew on Thu Apr 07, 2013  5:09 PM ------      Message from: [name] Mathews      Created: Thu Apr 07, 2013 10:05 AM       Please call his PSA. Very little change. ------

## 2013-04-12 ENCOUNTER — Other Ambulatory Visit: Payer: Self-pay | Admitting: *Deleted

## 2013-04-12 DIAGNOSIS — C61 Malignant neoplasm of prostate: Secondary | ICD-10-CM

## 2013-04-12 MED ORDER — PREDNISONE 5 MG PO TABS: 5.0000 mg | ORAL_TABLET | Freq: Two times a day (BID) | ORAL | Status: DC

## 2013-04-12 NOTE — Telephone Encounter (Signed)
Refilled prednisone script with 1 refill, [facility], [place], per dr Clelia Croft

## 2013-04-18 DIAGNOSIS — E538 Deficiency of other specified B group vitamins: Secondary | ICD-10-CM | POA: Diagnosis not present

## 2013-04-19 DIAGNOSIS — G4733 Obstructive sleep apnea (adult) (pediatric): Secondary | ICD-10-CM | POA: Diagnosis not present

## 2013-04-19 DIAGNOSIS — R7309 Other abnormal glucose: Secondary | ICD-10-CM | POA: Diagnosis not present

## 2013-04-19 DIAGNOSIS — I10 Essential (primary) hypertension: Secondary | ICD-10-CM | POA: Diagnosis not present

## 2013-04-19 DIAGNOSIS — Z23 Encounter for immunization: Secondary | ICD-10-CM | POA: Diagnosis not present

## 2013-04-19 DIAGNOSIS — G589 Mononeuropathy, unspecified: Secondary | ICD-10-CM | POA: Diagnosis not present

## 2013-04-19 DIAGNOSIS — E538 Deficiency of other specified B group vitamins: Secondary | ICD-10-CM | POA: Diagnosis not present

## 2013-04-19 DIAGNOSIS — D126 Benign neoplasm of colon, unspecified: Secondary | ICD-10-CM | POA: Diagnosis not present

## 2013-04-19 DIAGNOSIS — E7800 Pure hypercholesterolemia, unspecified: Secondary | ICD-10-CM | POA: Diagnosis not present

## 2013-04-28 ENCOUNTER — Other Ambulatory Visit: Payer: Self-pay

## 2013-05-05 ENCOUNTER — Other Ambulatory Visit: Payer: Self-pay | Admitting: *Deleted

## 2013-05-05 NOTE — Telephone Encounter (Signed)
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 

## 2013-05-06 ENCOUNTER — Other Ambulatory Visit: Payer: Self-pay | Admitting: *Deleted

## 2013-05-06 DIAGNOSIS — C61 Malignant neoplasm of prostate: Secondary | ICD-10-CM

## 2013-05-06 MED ORDER — ABIRATERONE ACETATE 250 MG PO TABS: 1000.0000 mg | ORAL_TABLET | Freq: Every day | ORAL | Status: DC

## 2013-05-06 NOTE — Telephone Encounter (Signed)
E-scribed  zytiga script to [facility]

## 2013-05-10 ENCOUNTER — Other Ambulatory Visit (HOSPITAL_BASED_OUTPATIENT_CLINIC_OR_DEPARTMENT_OTHER): Payer: Medicare Other | Admitting: Lab

## 2013-05-10 ENCOUNTER — Telehealth: Payer: Self-pay | Admitting: Oncology

## 2013-05-10 ENCOUNTER — Encounter: Payer: Self-pay | Admitting: Oncology

## 2013-05-10 ENCOUNTER — Ambulatory Visit (HOSPITAL_BASED_OUTPATIENT_CLINIC_OR_DEPARTMENT_OTHER): Payer: Medicare Other | Admitting: Oncology

## 2013-05-10 VITALS — BP 134/83 | HR 63 | Temp 98.9°F | Resp 18 | Ht 62.0 in | Wt 180.2 lb

## 2013-05-10 DIAGNOSIS — C7951 Secondary malignant neoplasm of bone: Secondary | ICD-10-CM

## 2013-05-10 DIAGNOSIS — C61 Malignant neoplasm of prostate: Secondary | ICD-10-CM

## 2013-05-10 DIAGNOSIS — E291 Testicular hypofunction: Secondary | ICD-10-CM | POA: Diagnosis not present

## 2013-05-10 LAB — COMPREHENSIVE METABOLIC PANEL (CC13)
ALT: 12 U/L (ref 0–55)
Albumin: 3.6 g/dL (ref 3.5–5.0)
Anion Gap: 8 mEq/L (ref 3–11)
CO2: 28 mEq/L (ref 22–29)
Calcium: 10.4 mg/dL (ref 8.4–10.4)
Chloride: 105 mEq/L (ref 98–109)
Potassium: 4.9 mEq/L (ref 3.5–5.1)
Sodium: 141 mEq/L (ref 136–145)
Total Protein: 6.9 g/dL (ref 6.4–8.3)

## 2013-05-10 LAB — CBC WITH DIFFERENTIAL/PLATELET
BASO%: 0.7 % (ref 0.0–2.0)
HCT: 37 % — ABNORMAL LOW (ref 38.4–49.9)
MCH: 27.2 pg (ref 27.2–33.4)
MCHC: 33 g/dL (ref 32.0–36.0)
MONO#: 0.5 10*3/uL (ref 0.1–0.9)
RBC: 4.48 10*6/uL (ref 4.20–5.82)
RDW: 14.5 % (ref 11.0–14.6)
WBC: 9.2 10*3/uL (ref 4.0–10.3)
lymph#: 1.4 10*3/uL (ref 0.9–3.3)

## 2013-05-10 MED ORDER — DENOSUMAB 120 MG/1.7ML ~~LOC~~ SOLN
120.0000 mg | Freq: Once | SUBCUTANEOUS | Status: AC
  Administered 2013-05-10: 120 mg via SUBCUTANEOUS
  Filled 2013-05-10: qty 1.7

## 2013-05-10 NOTE — Progress Notes (Signed)
Hematology and Oncology Follow Up Visit  [name] Mathews [id number] Nov 02, 1945 67 y.o. 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 He was inially diagnosed  2001, gleason score 4+5= 9.  Prior Therapy: 1. 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 He was started on hormone therapy, initially with Lupron   2. Due to  A rise in his PSA Casodex was added to firmagon in 2009.  3. Most recently, his PSA was up to 16, with castrate level testosterone and bone involvement based on a bone scan on 08/2101.  Current therapy:  Zytiga 1000 mg po daily started in 09/2011 for a PSA of 22. He is on Prednisone 5 mg twice a day with excellent response, PSA dropping from 33 to 2.34. Xgeva 120 mg started on 10/31/2011. Repeated every visit.  Lupron given by Dr. Brunilda Payor.   Interim History: [name] Mathews presents today for a follow up visit. He continues to be on Zytiga and tolerating this well. He reports no symptoms at this point. He reports no bone pain, no GU symptoms. No abdominal pain, nausea, or vomiting. He has no fluid retention or leg swelling. He has not had any shoulder pain, had not had any major changes in his performance status. He has continued to have excellent quality of life. He has not had any major changes in his performance status and not had any constitutional symptoms. He tolerating Xgeva well with out complications.   Medications: I have reviewed the patient's current medications. Current Outpatient Prescriptions  Medication Sig Dispense Refill  . abiraterone Acetate (ZYTIGA) 250 MG tablet Take 4 tablets (1,000 mg total) by mouth daily. Take on an empty stomach 1 hour before or 2 hours after a meal  120 tablet  1  . aspirin 81 MG tablet Take 81 mg  by mouth daily.      [name] atorvastatin (LIPITOR) 10 MG tablet Take 10 mg by mouth daily.      . Calcium Carbonate (CALTRATE 600 PO) Take 600 mg by mouth 3 (three) times daily.      . cyanocobalamin 1000 MCG tablet Inject 1,000 mcg into the muscle every 30 (thirty) days.      [name] denosumab (XGEVA) 120 MG/1.7ML SOLN Inject 120 mg into the skin every 30 (thirty) days.      . DULoxetine (CYMBALTA) 30 MG capsule Take 30 mg by mouth daily.      . fish oil-omega-3 fatty acids 1000 MG capsule Take 1 g by mouth daily.      . predniSONE (DELTASONE) 5 MG tablet Take 1 tablet (5 mg total) by mouth 2 (two) times daily.  60 tablet  1  . rizatriptan (MAXALT-MLT) 10 MG disintegrating tablet Take 10 mg by mouth daily.      [name] rOPINIRole (REQUIP) 2 MG tablet Take 2 mg by mouth 2 (two) times daily.      . solifenacin (VESICARE) 5 MG tablet Take 10 mg by mouth daily.       No current facility-administered medications for this visit.     Allergies: No Known Allergies  Past Medical History, Surgical history, Social history, and Family History were reviewed and updated.  Review of Systems:  Remaining ROS negative.  Physical Exam: Blood pressure 134/83, pulse 63, temperature 98.9  F (37.2 C), temperature source Oral, resp. rate 18, height 5\' 2"  (1.575 m), weight 180 lb 3.2 oz (81.738 kg). ECOG: 0 General appearance: alert Head: Normocephalic, without obvious abnormality, atraumatic Neck: no adenopathy, no carotid bruit, no JVD, supple, symmetrical, trachea midline and thyroid not enlarged, symmetric, no tenderness/mass/nodules Lymph nodes: Cervical, supraclavicular, and axillary nodes normal. Heart:regular rate and rhythm, S1, S2 normal, no murmur, click, rub or gallop Lung:chest clear, no wheezing, rales, normal symmetric air entry Abdomen: soft, non-tender, without masses or organomegaly EXT:no erythema, induration, or nodules Neurological: No deficits noted.  Lab Results: Lab Results  Component Value  Date   WBC 9.2 05/10/2013   HGB 12.2* 05/10/2013   HCT 37.0* 05/10/2013   MCV 82.5 05/10/2013   PLT 221 05/10/2013     Chemistry      Component Value Date/Time   NA 141 05/10/2013 1029   NA 143 01/14/2012 0935   K 4.9 05/10/2013 1029   K 4.4 01/14/2012 0935   CL 105 12/08/2012 1504   CL 107 01/14/2012 0935   CO2 28 05/10/2013 1029   CO2 29 01/14/2012 0935   BUN 12.1 05/10/2013 1029   BUN 11 01/14/2012 0935   CREATININE 0.9 05/10/2013 1029   CREATININE 0.83 01/14/2012 0935      Component Value Date/Time   CALCIUM 10.4 05/10/2013 1029   CALCIUM 9.5 01/14/2012 0935   ALKPHOS 76 05/10/2013 1029   ALKPHOS 87 01/14/2012 0935   AST 21 05/10/2013 1029   AST 18 01/14/2012 0935   ALT 12 05/10/2013 1029   ALT 14 01/14/2012 0935   BILITOT 0.57 05/10/2013 1029   BILITOT 0.7 01/14/2012 0935      Results for [name] Mathews, [name] Mathews (MRN [id number]) as of 05/10/2013 11:14  Ref. Range 11/02/2012 12:36 12/08/2012 15:04 01/11/2013 08:08 02/22/2013 10:16 04/06/2013 09:48  PSA Latest Range: <=4.00 ng/mL 3.53 2.40 2.56 2.68 2.73    Impression and Plan: This is a pleasant 67 year old gentleman with the following issues:  1. 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 He has an elevated PSA up to 33 in May of 2013. PSA dropped to 2.40. PSA has been stable around 2.73. We will continue with therapy with the same dose and schedule for now given that his PSA remains under control.   2. Bone directed therapy. On Xgeva with calcium supplementation. Also talked to him about complications associated with that which would include osteonecrosis of the jaw and I continued to educated him about dental hygiene.   3. Androgen deprivation: He is to continue Lupron under the care of Dr. Brunilda Payor.   4. Back pain: Resolved.   5. Follow-up: In 4-5 weeks.    Mardelle Pandolfi 11/18/201412:55 PM

## 2013-05-10 NOTE — Telephone Encounter (Signed)
appts made per 11/18 POF AVS and CAL to pt shh

## 2013-05-12 ENCOUNTER — Other Ambulatory Visit: Payer: Self-pay | Admitting: Medical Oncology

## 2013-05-12 DIAGNOSIS — C61 Malignant neoplasm of prostate: Secondary | ICD-10-CM

## 2013-05-12 MED ORDER — ABIRATERONE ACETATE 250 MG PO TABS: 1000.0000 mg | ORAL_TABLET | Freq: Every day | ORAL | Status: DC

## 2013-05-18 ENCOUNTER — Telehealth: Payer: Self-pay | Admitting: *Deleted

## 2013-05-18 NOTE — Telephone Encounter (Signed)
[facility] sent facsimile confirmation of Zytiga prescription shipment.  [name] Mathews was shipped on 05-17-2013 with next business day delivery.

## 2013-05-25 DIAGNOSIS — G609 Hereditary and idiopathic neuropathy, unspecified: Secondary | ICD-10-CM | POA: Diagnosis not present

## 2013-05-25 DIAGNOSIS — G2581 Restless legs syndrome: Secondary | ICD-10-CM | POA: Diagnosis not present

## 2013-05-25 DIAGNOSIS — IMO0002 Reserved for concepts with insufficient information to code with codable children: Secondary | ICD-10-CM | POA: Diagnosis not present

## 2013-05-25 DIAGNOSIS — G2589 Other specified extrapyramidal and movement disorders: Secondary | ICD-10-CM | POA: Diagnosis not present

## 2013-06-07 ENCOUNTER — Other Ambulatory Visit: Payer: Self-pay | Admitting: Medical Oncology

## 2013-06-07 DIAGNOSIS — C61 Malignant neoplasm of prostate: Secondary | ICD-10-CM

## 2013-06-07 MED ORDER — ABIRATERONE ACETATE 250 MG PO TABS: 1000.0000 mg | ORAL_TABLET | Freq: Every day | ORAL | Status: DC

## 2013-06-07 NOTE — Telephone Encounter (Signed)
Refill request recevied from [facility] for Zytiga. Placed in Dr. Alver Fisher box.

## 2013-06-08 ENCOUNTER — Other Ambulatory Visit (HOSPITAL_BASED_OUTPATIENT_CLINIC_OR_DEPARTMENT_OTHER): Payer: Medicare Other

## 2013-06-08 ENCOUNTER — Telehealth: Payer: Self-pay | Admitting: Oncology

## 2013-06-08 ENCOUNTER — Ambulatory Visit: Payer: Medicare Other

## 2013-06-08 ENCOUNTER — Other Ambulatory Visit: Payer: Medicare Other

## 2013-06-08 ENCOUNTER — Ambulatory Visit (HOSPITAL_BASED_OUTPATIENT_CLINIC_OR_DEPARTMENT_OTHER): Payer: Medicare Other | Admitting: Oncology

## 2013-06-08 VITALS — BP 147/79 | HR 60 | Temp 98.3°F | Resp 20 | Ht 62.0 in | Wt 180.9 lb

## 2013-06-08 DIAGNOSIS — C61 Malignant neoplasm of prostate: Secondary | ICD-10-CM | POA: Diagnosis not present

## 2013-06-08 DIAGNOSIS — C7951 Secondary malignant neoplasm of bone: Secondary | ICD-10-CM

## 2013-06-08 DIAGNOSIS — E291 Testicular hypofunction: Secondary | ICD-10-CM | POA: Diagnosis not present

## 2013-06-08 LAB — COMPREHENSIVE METABOLIC PANEL (CC13)
ALT: 13 U/L (ref 0–55)
AST: 19 U/L (ref 5–34)
Albumin: 3.4 g/dL — ABNORMAL LOW (ref 3.5–5.0)
Alkaline Phosphatase: 80 U/L (ref 40–150)
BUN: 13.4 mg/dL (ref 7.0–26.0)
Calcium: 9.9 mg/dL (ref 8.4–10.4)
Creatinine: 0.8 mg/dL (ref 0.7–1.3)
Glucose: 104 mg/dl (ref 70–140)
Potassium: 4.4 mEq/L (ref 3.5–5.1)
Sodium: 142 mEq/L (ref 136–145)
Total Bilirubin: 0.49 mg/dL (ref 0.20–1.20)
Total Protein: 7.1 g/dL (ref 6.4–8.3)

## 2013-06-08 LAB — CBC WITH DIFFERENTIAL/PLATELET
Basophils Absolute: 0 10*3/uL (ref 0.0–0.1)
EOS%: 0.4 % (ref 0.0–7.0)
Eosinophils Absolute: 0 10*3/uL (ref 0.0–0.5)
HGB: 12.7 g/dL — ABNORMAL LOW (ref 13.0–17.1)
LYMPH%: 15.9 % (ref 14.0–49.0)
MCH: 27.6 pg (ref 27.2–33.4)
MCV: 83.3 fL (ref 79.3–98.0)
MONO#: 0.5 10*3/uL (ref 0.1–0.9)
MONO%: 4.7 % (ref 0.0–14.0)
NEUT#: 7.9 10*3/uL — ABNORMAL HIGH (ref 1.5–6.5)
NEUT%: 78.6 % — ABNORMAL HIGH (ref 39.0–75.0)
Platelets: 219 10*3/uL (ref 140–400)
RDW: 14.7 % — ABNORMAL HIGH (ref 11.0–14.6)

## 2013-06-08 LAB — PSA: PSA: 3.89 ng/mL (ref <=4.00)

## 2013-06-08 MED ORDER — DENOSUMAB 120 MG/1.7ML ~~LOC~~ SOLN
120.0000 mg | Freq: Once | SUBCUTANEOUS | Status: AC
  Administered 2013-06-08: 120 mg via SUBCUTANEOUS
  Filled 2013-06-08: qty 1.7

## 2013-06-08 NOTE — Progress Notes (Signed)
Hematology and Oncology Follow Up Visit  [name] Mathews [id number] 01-01-1946 67 y.o. 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 He was inially diagnosed  2001, gleason score 4+5= 9.  Prior Therapy: 1. 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 He was started on hormone therapy, initially with Lupron   2. Due to  A rise in his PSA Casodex was added to firmagon in 2009.  3. Most recently, his PSA was up to 16, with castrate level testosterone and bone involvement based on a bone scan on 08/2101.  Current therapy:  Zytiga 1000 mg po daily started in 09/2011 for a PSA of 22. He is on Prednisone 5 mg twice a day with excellent response, PSA dropping from 33 to 2.34. Xgeva 120 mg started on 10/31/2011. Repeated every visit.  Lupron given by Dr. Brunilda Payor.   Interim History: [name] Mathews presents today for a follow up visit. He continues to be on Zytiga and tolerating this well without complications. He reports no symptoms at this point. He reports no bone pain, no GU symptoms. No abdominal pain, nausea, or vomiting. He has no fluid retention or leg swelling. He has not had any shoulder pain, had not had any major changes in his performance status. He has continued to have excellent quality of life. He has not reported any fevers chills or sweats has not reported any weight loss or appetite changes. Has not reported any abdominal pain or discomfort. Has not reported any hematochezia or melena. Does report some occasional right-sided flank pain that resolved spontaneously. He still able to perform work-related duties intermittently.   Medications: I have reviewed the patient's current medications. Current Outpatient Prescriptions  Medication Sig Dispense  Refill  . abiraterone Acetate (ZYTIGA) 250 MG tablet Take 4 tablets (1,000 mg total) by mouth daily. Take on an empty stomach 1 hour before or 2 hours after a meal  120 tablet  0  . aspirin 81 MG tablet Take 81 mg by mouth daily.      [name] atorvastatin (LIPITOR) 10 MG tablet Take 10 mg by mouth daily.      . Calcium Carbonate (CALTRATE 600 PO) Take 600 mg by mouth 3 (three) times daily.      . cyanocobalamin 1000 MCG tablet Inject 1,000 mcg into the muscle every 30 (thirty) days.      [name] denosumab (XGEVA) 120 MG/1.7ML SOLN Inject 120 mg into the skin every 30 (thirty) days.      . DULoxetine (CYMBALTA) 30 MG capsule Take 30 mg by mouth daily.      . fish oil-omega-3 fatty acids 1000 MG capsule Take 1 g by mouth daily.      . predniSONE (DELTASONE) 5 MG tablet Take 1 tablet (5 mg total) by mouth 2 (two) times daily.  60 tablet  1  . rizatriptan (MAXALT-MLT) 10 MG disintegrating tablet Take 10 mg by mouth daily.      [name] rOPINIRole (REQUIP) 2 MG tablet Take 2 mg by mouth 2 (two) times daily.      . solifenacin (VESICARE) 5 MG tablet Take 10 mg by mouth daily.       Current Facility-Administered Medications  Medication Dose Route Frequency Provider Last Rate Last Dose  . denosumab (XGEVA) injection 120  mg  120 mg Subcutaneous Once Myrtis Ser, NP         Allergies: No Known Allergies  Past Medical History, Surgical history, Social history, and Family History were reviewed and updated.  Review of Systems:  Remaining ROS negative.  Physical Exam: Blood pressure 147/79, pulse 60, temperature 98.3 F (36.8 C), temperature source Oral, resp. rate 20, height 5\' 2"  (1.575 m), weight 180 lb 14.4 oz (82.056 kg), SpO2 98.00%. ECOG: 0 General appearance: alert Head: Normocephalic, without obvious abnormality, atraumatic Neck: no adenopathy, no carotid bruit, no JVD, supple, symmetrical, trachea midline and thyroid not enlarged, symmetric, no tenderness/mass/nodules Lymph nodes: Cervical,  supraclavicular, and axillary nodes normal. Heart:regular rate and rhythm, S1, S2 normal, no murmur, click, rub or gallop Lung:chest clear, no wheezing, rales, normal symmetric air entry Abdomen: soft, non-tender, without masses or organomegaly EXT:no erythema, induration, or nodules Neurological: No deficits noted.  Lab Results: Lab Results  Component Value Date   WBC 10.0 06/08/2013   HGB 12.7* 06/08/2013   HCT 38.3* 06/08/2013   MCV 83.3 06/08/2013   PLT 219 06/08/2013     Chemistry      Component Value Date/Time   NA 141 05/10/2013 1029   NA 143 01/14/2012 0935   K 4.9 05/10/2013 1029   K 4.4 01/14/2012 0935   CL 105 12/08/2012 1504   CL 107 01/14/2012 0935   CO2 28 05/10/2013 1029   CO2 29 01/14/2012 0935   BUN 12.1 05/10/2013 1029   BUN 11 01/14/2012 0935   CREATININE 0.9 05/10/2013 1029   CREATININE 0.83 01/14/2012 0935      Component Value Date/Time   CALCIUM 10.4 05/10/2013 1029   CALCIUM 9.5 01/14/2012 0935   ALKPHOS 76 05/10/2013 1029   ALKPHOS 87 01/14/2012 0935   AST 21 05/10/2013 1029   AST 18 01/14/2012 0935   ALT 12 05/10/2013 1029   ALT 14 01/14/2012 0935   BILITOT 0.57 05/10/2013 1029   BILITOT 0.7 01/14/2012 0935      Results for NOEH, SPARACINO (MRN [id number]) as of 06/08/2013 09:56  Ref. Range 04/06/2013 09:48 05/10/2013 10:29  PSA Latest Range: <=4.00 ng/mL 2.73 3.35     Impression and Plan: This is a pleasant 68 year old gentleman with the following issues:  1. 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 He has an elevated PSA up to 33 in May of 2013. PSA dropped to 2.40. PSA has been stable around 2 to 3. We will continue with therapy with the same dose and schedule for now given that his PSA remains under control.   2. Bone directed therapy. On Xgeva with calcium supplementation. Also talked to him about complications associated with that which would include osteonecrosis of the jaw and  I continued to educated him about dental hygiene.   3. Androgen deprivation: He is to continue Lupron under the care of Dr. Brunilda Payor.   4. Back pain: Resolved.   5. Follow-up: In 4-5 weeks.    Kurtiss Wence 12/17/20149:53 AM

## 2013-06-08 NOTE — Telephone Encounter (Signed)
gv and printed appt sched and avs for opt for Jan....pt has inj every4weeks

## 2013-06-08 NOTE — Progress Notes (Signed)
Serita Grit given by desk nurse during office visit.

## 2013-06-21 NOTE — Telephone Encounter (Signed)
[facility] sent facsimile confirmation of Zytiga prescription shipment.  [name] Mathews was shipped on 06-20-2013 with next business day delivery.

## 2013-07-05 ENCOUNTER — Other Ambulatory Visit: Payer: Self-pay | Admitting: Oncology

## 2013-07-06 ENCOUNTER — Ambulatory Visit (HOSPITAL_BASED_OUTPATIENT_CLINIC_OR_DEPARTMENT_OTHER): Payer: Medicare Other

## 2013-07-06 ENCOUNTER — Other Ambulatory Visit (HOSPITAL_BASED_OUTPATIENT_CLINIC_OR_DEPARTMENT_OTHER): Payer: Medicare Other

## 2013-07-06 VITALS — BP 136/77 | HR 70 | Temp 98.6°F

## 2013-07-06 DIAGNOSIS — C7952 Secondary malignant neoplasm of bone marrow: Secondary | ICD-10-CM

## 2013-07-06 DIAGNOSIS — C61 Malignant neoplasm of prostate: Secondary | ICD-10-CM | POA: Diagnosis not present

## 2013-07-06 DIAGNOSIS — C7951 Secondary malignant neoplasm of bone: Secondary | ICD-10-CM

## 2013-07-06 LAB — COMPREHENSIVE METABOLIC PANEL (CC13)
ALBUMIN: 3.4 g/dL — AB (ref 3.5–5.0)
ALK PHOS: 78 U/L (ref 40–150)
ALT: 12 U/L (ref 0–55)
AST: 17 U/L (ref 5–34)
Anion Gap: 10 mEq/L (ref 3–11)
BILIRUBIN TOTAL: 0.6 mg/dL (ref 0.20–1.20)
BUN: 15.7 mg/dL (ref 7.0–26.0)
CO2: 29 mEq/L (ref 22–29)
Calcium: 9.4 mg/dL (ref 8.4–10.4)
Chloride: 106 mEq/L (ref 98–109)
Creatinine: 0.8 mg/dL (ref 0.7–1.3)
Glucose: 109 mg/dl (ref 70–140)
Potassium: 4 mEq/L (ref 3.5–5.1)
Sodium: 145 mEq/L (ref 136–145)
Total Protein: 6.9 g/dL (ref 6.4–8.3)

## 2013-07-06 LAB — CBC WITH DIFFERENTIAL/PLATELET
BASO%: 0.4 % (ref 0.0–2.0)
BASOS ABS: 0 10*3/uL (ref 0.0–0.1)
EOS%: 2.2 % (ref 0.0–7.0)
Eosinophils Absolute: 0.2 10*3/uL (ref 0.0–0.5)
HCT: 40.5 % (ref 38.4–49.9)
HEMOGLOBIN: 13 g/dL (ref 13.0–17.1)
LYMPH%: 32 % (ref 14.0–49.0)
MCH: 27 pg — ABNORMAL LOW (ref 27.2–33.4)
MCHC: 32.2 g/dL (ref 32.0–36.0)
MCV: 84 fL (ref 79.3–98.0)
MONO#: 0.7 10*3/uL (ref 0.1–0.9)
MONO%: 6.9 % (ref 0.0–14.0)
NEUT#: 6.1 10*3/uL (ref 1.5–6.5)
NEUT%: 58.5 % (ref 39.0–75.0)
PLATELETS: 220 10*3/uL (ref 140–400)
RBC: 4.82 10*6/uL (ref 4.20–5.82)
RDW: 14.7 % — AB (ref 11.0–14.6)
WBC: 10.4 10*3/uL — ABNORMAL HIGH (ref 4.0–10.3)
lymph#: 3.3 10*3/uL (ref 0.9–3.3)

## 2013-07-06 LAB — PSA: PSA: 4.77 ng/mL — AB (ref <=4.00)

## 2013-07-06 MED ORDER — DENOSUMAB 120 MG/1.7ML ~~LOC~~ SOLN
120.0000 mg | Freq: Once | SUBCUTANEOUS | Status: AC
  Administered 2013-07-06: 120 mg via SUBCUTANEOUS
  Filled 2013-07-06: qty 1.7

## 2013-07-15 ENCOUNTER — Telehealth: Payer: Self-pay | Admitting: Oncology

## 2013-07-15 ENCOUNTER — Ambulatory Visit (HOSPITAL_BASED_OUTPATIENT_CLINIC_OR_DEPARTMENT_OTHER): Payer: Medicare Other | Admitting: Oncology

## 2013-07-15 ENCOUNTER — Other Ambulatory Visit: Payer: Medicare Other

## 2013-07-15 ENCOUNTER — Other Ambulatory Visit: Payer: Self-pay | Admitting: *Deleted

## 2013-07-15 ENCOUNTER — Encounter: Payer: Self-pay | Admitting: Oncology

## 2013-07-15 VITALS — BP 149/69 | HR 68 | Temp 98.1°F | Resp 18 | Ht 62.0 in | Wt 186.1 lb

## 2013-07-15 DIAGNOSIS — R972 Elevated prostate specific antigen [PSA]: Secondary | ICD-10-CM | POA: Diagnosis not present

## 2013-07-15 DIAGNOSIS — C7951 Secondary malignant neoplasm of bone: Secondary | ICD-10-CM

## 2013-07-15 DIAGNOSIS — E291 Testicular hypofunction: Secondary | ICD-10-CM

## 2013-07-15 DIAGNOSIS — C61 Malignant neoplasm of prostate: Secondary | ICD-10-CM

## 2013-07-15 DIAGNOSIS — C7952 Secondary malignant neoplasm of bone marrow: Secondary | ICD-10-CM

## 2013-07-15 MED ORDER — ABIRATERONE ACETATE 250 MG PO TABS: 1000.0000 mg | ORAL_TABLET | Freq: Every day | ORAL | Status: DC

## 2013-07-15 NOTE — Addendum Note (Signed)
Addended by: Wyonia Hough on: 07/15/2013 12:36 PM   Modules accepted: Orders

## 2013-07-15 NOTE — Telephone Encounter (Signed)
gv and printed appt sched and avs for pt for Feb  °

## 2013-07-15 NOTE — Telephone Encounter (Signed)
THIS REFILL REQUEST WAS GIVEN TO KRISTIN CURCIO,NP.

## 2013-07-15 NOTE — Progress Notes (Signed)
Hematology and Oncology Follow Up Visit  [name] Mathews [id number] 1946/04/12 68 y.o. 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 He was inially diagnosed  2001, gleason score 4+5= 9.  Prior Therapy: 1. 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 He was started on hormone therapy, initially with Lupron   2. Due to  A rise in his PSA Casodex was added to firmagon in 2009.  3. Most recently, his PSA was up to 16, with castrate level testosterone and bone involvement based on a bone scan on 08/2101.  Current therapy:  Zytiga 1000 mg po daily started in 09/2011 for a PSA of 22. He is on Prednisone 5 mg twice a day with excellent response, PSA dropping from 33 to 2.34. Xgeva 120 mg started on 10/31/2011. Repeated every visit.  Lupron given by Dr. Janice Norrie.   Interim History: [name] Mathews presents today for a follow up visit. He continues to be on Zytiga and tolerating this well without complications. He reports no symptoms at this point. He reports no bone pain, no GU symptoms. No abdominal pain, nausea, or vomiting. He has no fluid retention or leg swelling. He has not had any shoulder pain, had not had any major changes in his performance status. He has continued to have excellent quality of life. He has not reported any fevers chills or sweats has not reported any weight loss or appetite changes. Has not reported any abdominal pain or discomfort. Has not reported any hematochezia or melena. Does report some occasional right-sided flank pain that resolved spontaneously. He still able to perform work-related duties intermittently.   Medications: I have reviewed the patient's current medications. Current Outpatient Prescriptions  Medication Sig Dispense  Refill  . vitamin B-12 (CYANOCOBALAMIN) 1000 MCG tablet Take 1,000 mcg by mouth daily.      [name] abiraterone Acetate (ZYTIGA) 250 MG tablet Take 4 tablets (1,000 mg total) by mouth daily. Take on an empty stomach 1 hour before or 2 hours after a meal  120 tablet  0  . atorvastatin (LIPITOR) 10 MG tablet Take 10 mg by mouth daily.      . Calcium Carbonate (CALTRATE 600 PO) Take 600 mg by mouth 3 (three) times daily.      [name] denosumab (XGEVA) 120 MG/1.7ML SOLN Inject 120 mg into the skin every 30 (thirty) days.      . DULoxetine (CYMBALTA) 30 MG capsule Take 30 mg by mouth daily.      . fish oil-omega-3 fatty acids 1000 MG capsule Take 1 g by mouth daily.      . predniSONE (DELTASONE) 5 MG tablet Take 1 tablet (5 mg total) by mouth 2 (two) times daily.  60 tablet  1  . rizatriptan (MAXALT-MLT) 10 MG disintegrating tablet Take 10 mg by mouth daily.      [name] rOPINIRole (REQUIP) 2 MG tablet Take 2 mg by mouth 2 (two) times daily.      . solifenacin (VESICARE) 5 MG tablet Take 10 mg by mouth daily.       No current facility-administered medications for this visit.     Allergies: No Known Allergies  Past Medical History, Surgical history, Social history, and Family History were reviewed and updated.  Review of Systems:  Remaining ROS  negative.  Physical Exam: Blood pressure 149/69, pulse 68, temperature 98.1 F (36.7 C), temperature source Oral, resp. rate 18, height 5\' 2"  (1.575 m), weight 186 lb 1.6 oz (84.414 kg), SpO2 100.00%. ECOG: 0 General appearance: alert Head: Normocephalic, without obvious abnormality, atraumatic Neck: no adenopathy, no carotid bruit, no JVD, supple, symmetrical, trachea midline and thyroid not enlarged, symmetric, no tenderness/mass/nodules Lymph nodes: Cervical, supraclavicular, and axillary nodes normal. Heart:regular rate and rhythm, S1, S2 normal, no murmur, click, rub or gallop Lung:chest clear, no wheezing, rales, normal symmetric air entry Abdomen: soft,  non-tender, without masses or organomegaly EXT:no erythema, induration, or nodules Neurological: No deficits noted.  Lab Results: Lab Results  Component Value Date   WBC 10.4* 07/06/2013   HGB 13.0 07/06/2013   HCT 40.5 07/06/2013   MCV 84.0 07/06/2013   PLT 220 07/06/2013     Chemistry      Component Value Date/Time   NA 145 07/06/2013 1048   NA 143 01/14/2012 0935   K 4.0 07/06/2013 1048   K 4.4 01/14/2012 0935   CL 105 12/08/2012 1504   CL 107 01/14/2012 0935   CO2 29 07/06/2013 1048   CO2 29 01/14/2012 0935   BUN 15.7 07/06/2013 1048   BUN 11 01/14/2012 0935   CREATININE 0.8 07/06/2013 1048   CREATININE 0.83 01/14/2012 0935      Component Value Date/Time   CALCIUM 9.4 07/06/2013 1048   CALCIUM 9.5 01/14/2012 0935   ALKPHOS 78 07/06/2013 1048   ALKPHOS 87 01/14/2012 0935   AST 17 07/06/2013 1048   AST 18 01/14/2012 0935   ALT 12 07/06/2013 1048   ALT 14 01/14/2012 0935   BILITOT 0.60 07/06/2013 1048   BILITOT 0.7 01/14/2012 0935     Results for [name] Mathews, [name] Mathews (MRN [id number]) as of 07/15/2013 11:49  Ref. Range 02/22/2013 10:16 04/06/2013 09:48 05/10/2013 10:29 06/08/2013 09:31 07/06/2013 10:48  PSA Latest Range: <=4.00 ng/mL 2.68 2.73 3.35 3.89 4.77 (H)    Impression and Plan: This is a pleasant 68 year old gentleman with the following issues:  1. 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 He has an elevated PSA up to 33 in May of 2013. PSA dropped to 2.40. PSA up slightly to 4.77. We will continue with therapy with the same dose and schedule for now given that his PSA remains under control.   2. Bone directed therapy. On Xgeva with calcium supplementation. Also talked to him about complications associated with that which would include osteonecrosis of the jaw and I continued to educated him about dental hygiene.   3. Androgen deprivation: He is to continue Lupron under the care of Dr. Janice Norrie.   4. Back pain: Resolved.   5.  Follow-up: In 4-5 weeks.    Mikey Bussing 1/23/20151:21 PM

## 2013-07-21 DIAGNOSIS — C61 Malignant neoplasm of prostate: Secondary | ICD-10-CM | POA: Diagnosis not present

## 2013-07-21 DIAGNOSIS — R7309 Other abnormal glucose: Secondary | ICD-10-CM | POA: Diagnosis not present

## 2013-07-21 DIAGNOSIS — G4733 Obstructive sleep apnea (adult) (pediatric): Secondary | ICD-10-CM | POA: Diagnosis not present

## 2013-07-21 DIAGNOSIS — E7800 Pure hypercholesterolemia, unspecified: Secondary | ICD-10-CM | POA: Diagnosis not present

## 2013-07-21 DIAGNOSIS — E538 Deficiency of other specified B group vitamins: Secondary | ICD-10-CM | POA: Diagnosis not present

## 2013-07-21 DIAGNOSIS — I10 Essential (primary) hypertension: Secondary | ICD-10-CM | POA: Diagnosis not present

## 2013-07-22 NOTE — Telephone Encounter (Signed)
RECEIVED A FAX FROM [facility] CONCERNING A CONFIRMATION OF PRESCRIPTION SHIPMENT FOR [place] ON 07/21/13.

## 2013-08-16 ENCOUNTER — Ambulatory Visit: Payer: Medicare Other | Admitting: Oncology

## 2013-08-16 ENCOUNTER — Other Ambulatory Visit: Payer: Self-pay | Admitting: *Deleted

## 2013-08-16 ENCOUNTER — Telehealth: Payer: Self-pay | Admitting: *Deleted

## 2013-08-16 ENCOUNTER — Other Ambulatory Visit: Payer: Medicare Other

## 2013-08-16 NOTE — Telephone Encounter (Signed)
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

## 2013-08-16 NOTE — Progress Notes (Signed)
pof to schedulers for appt r/s. Per dr Alen Blew, 08/26/13 lab, mid level, [facility] and injection.

## 2013-08-16 NOTE — Telephone Encounter (Signed)
Spoke with patient, schedulers will be calling him with an appt time for 08/26/13 for lab, injection and mid-level visit

## 2013-08-17 ENCOUNTER — Telehealth: Payer: Self-pay | Admitting: Oncology

## 2013-08-17 NOTE — Telephone Encounter (Signed)
lmonvm advising the pt of his march appts for lab,inj and md visit with the pa.

## 2013-08-22 ENCOUNTER — Other Ambulatory Visit: Payer: Self-pay | Admitting: *Deleted

## 2013-08-22 DIAGNOSIS — C61 Malignant neoplasm of prostate: Secondary | ICD-10-CM

## 2013-08-22 MED ORDER — ABIRATERONE ACETATE 250 MG PO TABS: 1000.0000 mg | ORAL_TABLET | Freq: Every day | ORAL | Status: DC

## 2013-08-23 DIAGNOSIS — IMO0002 Reserved for concepts with insufficient information to code with codable children: Secondary | ICD-10-CM | POA: Diagnosis not present

## 2013-08-23 DIAGNOSIS — G609 Hereditary and idiopathic neuropathy, unspecified: Secondary | ICD-10-CM | POA: Diagnosis not present

## 2013-08-23 DIAGNOSIS — G2589 Other specified extrapyramidal and movement disorders: Secondary | ICD-10-CM | POA: Diagnosis not present

## 2013-08-23 DIAGNOSIS — G2581 Restless legs syndrome: Secondary | ICD-10-CM | POA: Diagnosis not present

## 2013-08-26 ENCOUNTER — Ambulatory Visit (HOSPITAL_BASED_OUTPATIENT_CLINIC_OR_DEPARTMENT_OTHER): Payer: Medicare Other | Admitting: Physician Assistant

## 2013-08-26 ENCOUNTER — Encounter: Payer: Self-pay | Admitting: Physician Assistant

## 2013-08-26 ENCOUNTER — Ambulatory Visit (HOSPITAL_BASED_OUTPATIENT_CLINIC_OR_DEPARTMENT_OTHER): Payer: Medicare Other

## 2013-08-26 ENCOUNTER — Ambulatory Visit: Payer: Medicare Other | Admitting: Physician Assistant

## 2013-08-26 ENCOUNTER — Other Ambulatory Visit (HOSPITAL_BASED_OUTPATIENT_CLINIC_OR_DEPARTMENT_OTHER): Payer: Medicare Other

## 2013-08-26 VITALS — BP 139/86 | HR 66 | Temp 98.5°F | Resp 20 | Ht 61.5 in | Wt 190.6 lb

## 2013-08-26 DIAGNOSIS — C7952 Secondary malignant neoplasm of bone marrow: Secondary | ICD-10-CM

## 2013-08-26 DIAGNOSIS — C61 Malignant neoplasm of prostate: Secondary | ICD-10-CM

## 2013-08-26 DIAGNOSIS — C7951 Secondary malignant neoplasm of bone: Secondary | ICD-10-CM | POA: Diagnosis not present

## 2013-08-26 LAB — CBC WITH DIFFERENTIAL/PLATELET
BASO%: 0.3 % (ref 0.0–2.0)
BASOS ABS: 0 10*3/uL (ref 0.0–0.1)
EOS%: 0.4 % (ref 0.0–7.0)
Eosinophils Absolute: 0 10*3/uL (ref 0.0–0.5)
HCT: 39.1 % (ref 38.4–49.9)
HEMOGLOBIN: 12.4 g/dL — AB (ref 13.0–17.1)
LYMPH#: 2.1 10*3/uL (ref 0.9–3.3)
LYMPH%: 16.1 % (ref 14.0–49.0)
MCH: 26.3 pg — ABNORMAL LOW (ref 27.2–33.4)
MCHC: 31.6 g/dL — ABNORMAL LOW (ref 32.0–36.0)
MCV: 83.3 fL (ref 79.3–98.0)
MONO#: 0.6 10*3/uL (ref 0.1–0.9)
MONO%: 4.5 % (ref 0.0–14.0)
NEUT#: 10.2 10*3/uL — ABNORMAL HIGH (ref 1.5–6.5)
NEUT%: 78.7 % — ABNORMAL HIGH (ref 39.0–75.0)
PLATELETS: 225 10*3/uL (ref 140–400)
RBC: 4.7 10*6/uL (ref 4.20–5.82)
RDW: 14.7 % — ABNORMAL HIGH (ref 11.0–14.6)
WBC: 12.9 10*3/uL — AB (ref 4.0–10.3)

## 2013-08-26 LAB — COMPREHENSIVE METABOLIC PANEL (CC13)
ALT: 13 U/L (ref 0–55)
ANION GAP: 9 meq/L (ref 3–11)
AST: 19 U/L (ref 5–34)
Albumin: 3.4 g/dL — ABNORMAL LOW (ref 3.5–5.0)
Alkaline Phosphatase: 86 U/L (ref 40–150)
BUN: 13.5 mg/dL (ref 7.0–26.0)
CALCIUM: 9.7 mg/dL (ref 8.4–10.4)
CHLORIDE: 106 meq/L (ref 98–109)
CO2: 28 mEq/L (ref 22–29)
Creatinine: 0.8 mg/dL (ref 0.7–1.3)
GLUCOSE: 96 mg/dL (ref 70–140)
Potassium: 4.4 mEq/L (ref 3.5–5.1)
Sodium: 143 mEq/L (ref 136–145)
Total Bilirubin: 0.29 mg/dL (ref 0.20–1.20)
Total Protein: 6.9 g/dL (ref 6.4–8.3)

## 2013-08-26 LAB — PSA: PSA: 5.06 ng/mL — AB (ref <=4.00)

## 2013-08-26 MED ORDER — DENOSUMAB 120 MG/1.7ML ~~LOC~~ SOLN
120.0000 mg | Freq: Once | SUBCUTANEOUS | Status: AC
  Administered 2013-08-26: 120 mg via SUBCUTANEOUS
  Filled 2013-08-26: qty 1.7

## 2013-08-26 MED ORDER — PREDNISONE 5 MG PO TABS: 5.0000 mg | ORAL_TABLET | Freq: Two times a day (BID) | ORAL | Status: DC

## 2013-08-26 NOTE — Progress Notes (Signed)
Hematology and Oncology Follow Up Visit  [name] Mathews [id number] 1946-05-05 68 y.o. 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 He was inially diagnosed  2001, gleason score 4+5= 9.  Prior Therapy: 1. 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 He was started on hormone therapy, initially with Lupron   2. Due to  A rise in his PSA Casodex was added to firmagon in 2009.  3. Most recently, his PSA was up to 16, with castrate level testosterone and bone involvement based on a bone scan on 08/2101.  Current therapy:  Zytiga 1000 mg po daily started in 09/2011 for a PSA of 22. He is on Prednisone 5 mg twice a day with excellent response, PSA dropping initially from 33 to 2.34. Xgeva 120 mg started on 10/31/2011. Repeated every visit.  Lupron given by Dr. Janice Norrie.   Interim History: [name] Mathews presents today for a follow up visit. He continues to be on Zytiga and tolerating this well without complications. He reports a sensation of wanting to vomit some mornings when he takes his Zytiga, however he does not end up vomiting. He states that if he drinks more water when he feels like this it subsides. He's not describing it as queasiness or nausea. He has not tried taking an anti-emetic. He reports no bone pain, no GU symptoms. No abdominal pain, nausea, or vomiting. He has no fluid retention or leg swelling. He denied any shoulder pain, has not had any major changes in his performance status. He has continued to have excellent quality of life. He denies any fevers chills or sweats has not reported any weight loss or appetite changes. Has not reported any abdominal pain or discomfort. Has not reported any hematochezia or melena.  He still able to perform  work-related duties intermittently. He requests a refill prescription for his prednisone tablets.   Medications: I have reviewed the patient's current medications. Current Outpatient Prescriptions  Medication Sig Dispense Refill  . abiraterone Acetate (ZYTIGA) 250 MG tablet Take 4 tablets (1,000 mg total) by mouth daily. Take on an empty stomach 1 hour before or 2 hours after a meal  120 tablet  0  . atorvastatin (LIPITOR) 10 MG tablet Take 10 mg by mouth daily.      . Calcium Carbonate (CALTRATE 600 PO) Take 600 mg by mouth 3 (three) times daily.      [name] denosumab (XGEVA) 120 MG/1.7ML SOLN Inject 120 mg into the skin every 30 (thirty) days.      . DULoxetine (CYMBALTA) 30 MG capsule Take 30 mg by mouth daily.      . fish oil-omega-3 fatty acids 1000 MG capsule Take 1 g by mouth daily.      . predniSONE (DELTASONE) 5 MG tablet Take 1 tablet (5 mg total) by mouth 2 (two) times daily.  60 tablet  1  . rizatriptan (MAXALT-MLT) 10 MG disintegrating tablet Take 10 mg by mouth daily.      [name] rOPINIRole (REQUIP) 2 MG tablet Take 2 mg by mouth 2 (two) times daily.      . vitamin B-12 (CYANOCOBALAMIN) 1000 MCG tablet Take 1,000 mcg by mouth daily.      . solifenacin (VESICARE) 5 MG tablet Take 10 mg by mouth daily.  No current facility-administered medications for this visit.     Allergies: No Known Allergies  Past Medical History, Surgical history, Social history, and Family History were reviewed and updated.  Review of Systems:  Remaining ROS negative.  Physical Exam: There were no vitals taken for this visit. ECOG: 0 General appearance: alert Head: Normocephalic, without obvious abnormality, atraumatic Neck: no adenopathy, no carotid bruit, no JVD, supple, symmetrical, trachea midline and thyroid not enlarged, symmetric, no tenderness/mass/nodules Lymph nodes: Cervical, supraclavicular, and axillary nodes normal. Heart:regular rate and rhythm, S1, S2 normal, no murmur, click, rub or  gallop Lung:chest clear, no wheezing, rales, normal symmetric air entry Abdomen: soft, non-tender, without masses or organomegaly EXT:no erythema, induration, or nodules Neurological: No deficits noted.  Lab Results: Lab Results  Component Value Date   WBC 12.9* 08/26/2013   HGB 12.4* 08/26/2013   HCT 39.1 08/26/2013   MCV 83.3 08/26/2013   PLT 225 08/26/2013     Chemistry      Component Value Date/Time   NA 143 08/26/2013 1409   NA 143 01/14/2012 0935   K 4.4 08/26/2013 1409   K 4.4 01/14/2012 0935   CL 105 12/08/2012 1504   CL 107 01/14/2012 0935   CO2 28 08/26/2013 1409   CO2 29 01/14/2012 0935   BUN 13.5 08/26/2013 1409   BUN 11 01/14/2012 0935   CREATININE 0.8 08/26/2013 1409   CREATININE 0.83 01/14/2012 0935      Component Value Date/Time   CALCIUM 9.7 08/26/2013 1409   CALCIUM 9.5 01/14/2012 0935   ALKPHOS 86 08/26/2013 1409   ALKPHOS 87 01/14/2012 0935   AST 19 08/26/2013 1409   AST 18 01/14/2012 0935   ALT 13 08/26/2013 1409   ALT 14 01/14/2012 0935   BILITOT 0.29 08/26/2013 1409   BILITOT 0.7 01/14/2012 0935     Results for [name] Mathews, [name] Mathews (MRN [id number]) as of 07/15/2013 11:49  Ref. Range 02/22/2013 10:16 04/06/2013 09:48 05/10/2013 10:29 06/08/2013 09:31 07/06/2013 10:48  PSA Latest Range: <=4.00 ng/mL 2.68 2.73 3.35 3.89 4.77 (H)    Impression and Plan: This is a pleasant 68 year old gentleman with the following issues:  1. 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 He has an elevated PSA up to 33 in May of 2013. PSA dropped to 2.40. PSA up slightly to 4.77. We will continue with therapy with the same dose and schedule for now given that his PSA remains under control. PSA pending from today.  2. Bone directed therapy. On Xgeva with calcium supplementation. Dental hygiene encouraged.  3. Androgen deprivation: He is to continue Lupron under the care of Dr. Janice Norrie.   4. Back pain: Resolved.   5. Follow-up: In 4-5 weeks.  Refill  prescription for his prednisone sent to his pharmacy of record via E. scribed   Wynetta Emery, Anarie Kalish E 3/6/20154:20 PM

## 2013-08-29 ENCOUNTER — Ambulatory Visit: Payer: Medicare Other

## 2013-08-29 NOTE — Patient Instructions (Signed)
Continue on your current therapy Followup in one month

## 2013-09-06 ENCOUNTER — Telehealth: Payer: Self-pay | Admitting: Medical Oncology

## 2013-09-06 ENCOUNTER — Telehealth: Payer: Self-pay | Admitting: Oncology

## 2013-09-06 DIAGNOSIS — C61 Malignant neoplasm of prostate: Secondary | ICD-10-CM

## 2013-09-06 MED ORDER — PREDNISONE 5 MG PO TABS: 5.0000 mg | ORAL_TABLET | Freq: Two times a day (BID) | ORAL | Status: DC

## 2013-09-06 NOTE — Telephone Encounter (Signed)
Patient called stating prescription for prednisone not received by pharmacy, ordered LOV with Awilda Metro PA. Call placed to patient's pharmacy for clarification, states prescription not received, new order given. Patient aware.

## 2013-09-06 NOTE — Telephone Encounter (Signed)
pt called in for appt. per 3/6 pof scheduler was waiting for response from [facility]. added lb/FS/inj for 4/3 per 3/6 pof. pt is aware of appt d/t.

## 2013-09-22 ENCOUNTER — Other Ambulatory Visit: Payer: Self-pay | Admitting: Medical Oncology

## 2013-09-22 ENCOUNTER — Other Ambulatory Visit: Payer: Self-pay | Admitting: *Deleted

## 2013-09-22 DIAGNOSIS — C61 Malignant neoplasm of prostate: Secondary | ICD-10-CM

## 2013-09-22 MED ORDER — ABIRATERONE ACETATE 250 MG PO TABS: 1000.0000 mg | ORAL_TABLET | Freq: Every day | ORAL | Status: DC

## 2013-09-22 NOTE — Telephone Encounter (Signed)
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

## 2013-09-22 NOTE — Telephone Encounter (Signed)
Zytiga prescription refill faxed to [facility].

## 2013-09-23 ENCOUNTER — Telehealth: Payer: Self-pay | Admitting: Oncology

## 2013-09-23 ENCOUNTER — Ambulatory Visit (HOSPITAL_BASED_OUTPATIENT_CLINIC_OR_DEPARTMENT_OTHER): Payer: Medicare Other | Admitting: Oncology

## 2013-09-23 ENCOUNTER — Ambulatory Visit (HOSPITAL_BASED_OUTPATIENT_CLINIC_OR_DEPARTMENT_OTHER): Payer: Medicare Other

## 2013-09-23 ENCOUNTER — Other Ambulatory Visit (HOSPITAL_BASED_OUTPATIENT_CLINIC_OR_DEPARTMENT_OTHER): Payer: Medicare Other

## 2013-09-23 ENCOUNTER — Encounter: Payer: Self-pay | Admitting: Oncology

## 2013-09-23 VITALS — BP 138/70 | HR 66 | Temp 98.4°F | Resp 20 | Ht 61.5 in | Wt 189.4 lb

## 2013-09-23 DIAGNOSIS — C7952 Secondary malignant neoplasm of bone marrow: Secondary | ICD-10-CM

## 2013-09-23 DIAGNOSIS — C61 Malignant neoplasm of prostate: Secondary | ICD-10-CM | POA: Diagnosis not present

## 2013-09-23 DIAGNOSIS — C7951 Secondary malignant neoplasm of bone: Secondary | ICD-10-CM

## 2013-09-23 LAB — CBC WITH DIFFERENTIAL/PLATELET
BASO%: 0.3 % (ref 0.0–2.0)
Basophils Absolute: 0 10*3/uL (ref 0.0–0.1)
EOS ABS: 0.2 10*3/uL (ref 0.0–0.5)
EOS%: 2.2 % (ref 0.0–7.0)
HEMATOCRIT: 36.9 % — AB (ref 38.4–49.9)
HGB: 11.8 g/dL — ABNORMAL LOW (ref 13.0–17.1)
LYMPH#: 2.6 10*3/uL (ref 0.9–3.3)
LYMPH%: 31.1 % (ref 14.0–49.0)
MCH: 26.6 pg — ABNORMAL LOW (ref 27.2–33.4)
MCHC: 32 g/dL (ref 32.0–36.0)
MCV: 83.1 fL (ref 79.3–98.0)
MONO#: 0.7 10*3/uL (ref 0.1–0.9)
MONO%: 8.6 % (ref 0.0–14.0)
NEUT%: 57.8 % (ref 39.0–75.0)
NEUTROS ABS: 4.9 10*3/uL (ref 1.5–6.5)
PLATELETS: 233 10*3/uL (ref 140–400)
RBC: 4.44 10*6/uL (ref 4.20–5.82)
RDW: 14.8 % — ABNORMAL HIGH (ref 11.0–14.6)
WBC: 8.5 10*3/uL (ref 4.0–10.3)

## 2013-09-23 LAB — COMPREHENSIVE METABOLIC PANEL (CC13)
ALT: 10 U/L (ref 0–55)
ANION GAP: 9 meq/L (ref 3–11)
AST: 19 U/L (ref 5–34)
Albumin: 3.3 g/dL — ABNORMAL LOW (ref 3.5–5.0)
Alkaline Phosphatase: 78 U/L (ref 40–150)
BILIRUBIN TOTAL: 0.29 mg/dL (ref 0.20–1.20)
BUN: 10.5 mg/dL (ref 7.0–26.0)
CALCIUM: 9 mg/dL (ref 8.4–10.4)
CHLORIDE: 105 meq/L (ref 98–109)
CO2: 28 mEq/L (ref 22–29)
Creatinine: 0.8 mg/dL (ref 0.7–1.3)
GLUCOSE: 100 mg/dL (ref 70–140)
Potassium: 3.6 mEq/L (ref 3.5–5.1)
SODIUM: 142 meq/L (ref 136–145)
TOTAL PROTEIN: 6.6 g/dL (ref 6.4–8.3)

## 2013-09-23 MED ORDER — DENOSUMAB 120 MG/1.7ML ~~LOC~~ SOLN
120.0000 mg | Freq: Once | SUBCUTANEOUS | Status: AC
  Administered 2013-09-23: 120 mg via SUBCUTANEOUS
  Filled 2013-09-23: qty 1.7

## 2013-09-23 MED ORDER — DENOSUMAB 120 MG/1.7ML ~~LOC~~ SOLN: 120.0000 mg | Freq: Once | SUBCUTANEOUS | Status: DC

## 2013-09-23 NOTE — Telephone Encounter (Signed)
gv pt appt schedule for may.  °

## 2013-09-23 NOTE — Progress Notes (Signed)
Hematology and Oncology Follow Up Visit  [name] Mathews [id number] 10/14/1945 68 y.o. 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 He was inially diagnosed  2001, gleason score 4+5= 9.  Prior Therapy: 1. 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 He was started on hormone therapy, initially with Lupron   2. Due to  A rise in his PSA Casodex was added to firmagon in 2009.  3. Most recently, his PSA was up to 16, with castrate level testosterone and bone involvement based on a bone scan on 08/2101.  Current therapy:  Zytiga 1000 mg po daily started in 09/2011 for a PSA of 22. He is on Prednisone 5 mg twice a day with excellent response, PSA dropping initially from 33 to 2.34. Xgeva 120 mg started on 10/31/2011. Repeated every visit.  Lupron given by Dr. Janice Norrie.   Interim History: [name] Mathews presents today for a follow up visit. He continues to be on Zytiga and tolerating this well without complications. He reports a sensation of wanting to vomit some mornings when he takes his Zytiga, without actually vomiting but that sensation has subsided. He's not having any nausea at this time. He has not tried taking an anti-emetic. He reports no bone pain, no GU symptoms. No abdominal pain, nausea, or vomiting. He has no fluid retention or leg swelling. He denied any shoulder pain, has not had any major changes in his performance status. He has continued to have excellent quality of life. He denies any fevers chills or sweats has not reported any weight loss or appetite changes. Has not reported any abdominal pain or discomfort. Has not reported any hematochezia or melena.  He still able to perform work-related duties intermittently. 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   Medications: I have reviewed the patient's current medications. Current Outpatient Prescriptions  Medication Sig Dispense Refill  . abiraterone Acetate (ZYTIGA) 250 MG tablet Take 4 tablets (1,000 mg total) by mouth daily. Take on an empty stomach 1 hour before or 2 hours after a meal  120 tablet  0  . atorvastatin (LIPITOR) 10 MG tablet Take 10 mg by mouth daily.      . Calcium Carbonate (CALTRATE 600 PO) Take 600 mg by mouth 3 (three) times daily.      [name] denosumab (XGEVA) 120 MG/1.7ML SOLN Inject 120 mg into the skin every 30 (thirty) days.      . DULoxetine (CYMBALTA) 30 MG capsule Take 30 mg by mouth daily.      . fish oil-omega-3 fatty acids 1000 MG capsule Take 1 g by mouth daily.      . predniSONE (DELTASONE) 5 MG tablet Take 1 tablet (5 mg total) by mouth 2 (two) times daily.  60 tablet  1  . rizatriptan (MAXALT-MLT) 10 MG disintegrating tablet Take 10 mg by mouth daily.      [name] rOPINIRole (REQUIP) 2 MG tablet Take 2 mg by mouth 2 (two) times daily.      . solifenacin (VESICARE) 5 MG tablet Take 10 mg by mouth daily.      . vitamin B-12 (CYANOCOBALAMIN) 1000 MCG tablet Take 1,000 mcg by mouth daily.       No current  facility-administered medications for this visit.     Allergies: No Known Allergies  Past Medical History, Surgical history, Social history, and Family History were reviewed and updated.  Review of Systems:  Remaining ROS negative.  Physical Exam: Blood pressure 138/70, pulse 66, temperature 98.4 F (36.9 C), temperature source Oral, resp. rate 20, height 5' 1.5" (1.562 m), weight 189 lb 6.4 oz (85.911 kg). ECOG: 0 General appearance: alert awake appeared in no active distress. Head: Normocephalic, without obvious abnormality, atraumatic Neck: no adenopathy, no carotid bruit, no JVD, supple, symmetrical, trachea midline and thyroid not enlarged, symmetric, no tenderness/mass/nodules Lymph nodes: Cervical,  supraclavicular, and axillary nodes normal. Heart:regular rate and rhythm, S1, S2 normal, no murmur, click, rub or gallop Lung:chest clear, no wheezing, rales, normal symmetric air entry Abdomen: soft, non-tender, without masses or organomegaly EXT:no erythema, induration, or nodules Neurological: No deficits noted.  Lab Results: Lab Results  Component Value Date   WBC 8.5 09/23/2013   HGB 11.8* 09/23/2013   HCT 36.9* 09/23/2013   MCV 83.1 09/23/2013   PLT 233 09/23/2013     Chemistry      Component Value Date/Time   NA 142 09/23/2013 1451   NA 143 01/14/2012 0935   K 3.6 09/23/2013 1451   K 4.4 01/14/2012 0935   CL 105 12/08/2012 1504   CL 107 01/14/2012 0935   CO2 28 09/23/2013 1451   CO2 29 01/14/2012 0935   BUN 10.5 09/23/2013 1451   BUN 11 01/14/2012 0935   CREATININE 0.8 09/23/2013 1451   CREATININE 0.83 01/14/2012 0935      Component Value Date/Time   CALCIUM 9.0 09/23/2013 1451   CALCIUM 9.5 01/14/2012 0935   ALKPHOS 78 09/23/2013 1451   ALKPHOS 87 01/14/2012 0935   AST 19 09/23/2013 1451   AST 18 01/14/2012 0935   ALT 10 09/23/2013 1451   ALT 14 01/14/2012 0935   BILITOT 0.29 09/23/2013 1451   BILITOT 0.7 01/14/2012 0935      Results for [name], Mathews (MRN [id number]) as of 09/23/2013 15:28  Ref. Range 12/08/2012 15:04 01/11/2013 08:08 02/22/2013 10:16 04/06/2013 09:48 05/10/2013 10:29 06/08/2013 09:31 07/06/2013 10:48 08/26/2013 14:09  PSA Latest Range: <=4.00 ng/mL 2.40 2.56 2.68 2.73 3.35 3.89 4.77 (H) 5.06 (H)    Impression and Plan: This is a pleasant 67 year old gentleman with the following issues:  1. 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 He has an elevated PSA up to 33 in May of 2013. PSA dropped to 2.40. PSA up slightly to 5.06. Despite the rise in his PSA the doubling time still over 6 months. He continues to be asymptomatic and tolerating this medication very well. The plan is to continue with therapy with the same dose and  schedule for now given that his PSA remains under control. PSA pending from today. I explained to him if he develops rapid PSA doubling time then we will switch to a different salvage therapy.  2. Bone directed therapy. On Xgeva with calcium supplementation. Dental hygiene encouraged. Continue to educate him about complications such as osteonecrosis of the jaw.  3. Androgen deprivation: He is to continue Lupron under the care of Dr. Janice Norrie.   4. Back pain: Resolved.   5. Follow-up: In 4-5 weeks.    [id number] 4/3/20153:37 PM

## 2013-09-24 LAB — PSA: PSA: 3.68 ng/mL (ref <=4.00)

## 2013-09-27 NOTE — Telephone Encounter (Signed)
Received fax from [facility] that [name] Mathews has been shipped on 09/26/13.[name]

## 2013-10-06 DIAGNOSIS — J209 Acute bronchitis, unspecified: Secondary | ICD-10-CM | POA: Diagnosis not present

## 2013-10-12 DIAGNOSIS — C61 Malignant neoplasm of prostate: Secondary | ICD-10-CM | POA: Diagnosis not present

## 2013-10-21 ENCOUNTER — Other Ambulatory Visit: Payer: Self-pay | Admitting: Medical Oncology

## 2013-10-21 ENCOUNTER — Other Ambulatory Visit: Payer: Self-pay | Admitting: *Deleted

## 2013-10-21 DIAGNOSIS — C61 Malignant neoplasm of prostate: Secondary | ICD-10-CM

## 2013-10-21 MED ORDER — ABIRATERONE ACETATE 250 MG PO TABS: 1000.0000 mg | ORAL_TABLET | Freq: Every day | ORAL | Status: DC

## 2013-10-21 NOTE — Telephone Encounter (Signed)
Prescription refill for Zytiga faxed to [facility] @ [phone] 

## 2013-10-21 NOTE — Telephone Encounter (Signed)
THIS REFILL REQUEST WAS PLACED ON DR.[name]'S DESK. 

## 2013-10-24 NOTE — Telephone Encounter (Signed)
RECEIVED A FAX FROM [facility] CONCERNING A CONFIRMATION OF FACSIMILE FOR PT. REFERRAL.

## 2013-11-04 ENCOUNTER — Other Ambulatory Visit: Payer: Self-pay

## 2013-11-04 ENCOUNTER — Ambulatory Visit (HOSPITAL_BASED_OUTPATIENT_CLINIC_OR_DEPARTMENT_OTHER): Payer: Medicare Other | Admitting: Physician Assistant

## 2013-11-04 ENCOUNTER — Other Ambulatory Visit (HOSPITAL_BASED_OUTPATIENT_CLINIC_OR_DEPARTMENT_OTHER): Payer: Medicare Other

## 2013-11-04 ENCOUNTER — Telehealth: Payer: Self-pay | Admitting: Oncology

## 2013-11-04 ENCOUNTER — Ambulatory Visit (HOSPITAL_BASED_OUTPATIENT_CLINIC_OR_DEPARTMENT_OTHER): Payer: Medicare Other

## 2013-11-04 ENCOUNTER — Encounter: Payer: Self-pay | Admitting: Physician Assistant

## 2013-11-04 VITALS — BP 136/67 | HR 60 | Temp 97.5°F | Resp 18 | Ht 61.5 in | Wt 185.4 lb

## 2013-11-04 DIAGNOSIS — C7951 Secondary malignant neoplasm of bone: Secondary | ICD-10-CM

## 2013-11-04 DIAGNOSIS — C7952 Secondary malignant neoplasm of bone marrow: Secondary | ICD-10-CM | POA: Diagnosis not present

## 2013-11-04 DIAGNOSIS — C61 Malignant neoplasm of prostate: Secondary | ICD-10-CM | POA: Diagnosis not present

## 2013-11-04 DIAGNOSIS — E291 Testicular hypofunction: Secondary | ICD-10-CM | POA: Diagnosis not present

## 2013-11-04 LAB — CBC WITH DIFFERENTIAL/PLATELET
BASO%: 0.5 % (ref 0.0–2.0)
BASOS ABS: 0.1 10*3/uL (ref 0.0–0.1)
EOS ABS: 0.2 10*3/uL (ref 0.0–0.5)
EOS%: 1.4 % (ref 0.0–7.0)
HCT: 37.1 % — ABNORMAL LOW (ref 38.4–49.9)
HEMOGLOBIN: 11.8 g/dL — AB (ref 13.0–17.1)
LYMPH%: 24.9 % (ref 14.0–49.0)
MCH: 26.4 pg — ABNORMAL LOW (ref 27.2–33.4)
MCHC: 31.8 g/dL — ABNORMAL LOW (ref 32.0–36.0)
MCV: 82.9 fL (ref 79.3–98.0)
MONO#: 0.8 10*3/uL (ref 0.1–0.9)
MONO%: 6.8 % (ref 0.0–14.0)
NEUT%: 66.4 % (ref 39.0–75.0)
NEUTROS ABS: 7.9 10*3/uL — AB (ref 1.5–6.5)
PLATELETS: 230 10*3/uL (ref 140–400)
RBC: 4.48 10*6/uL (ref 4.20–5.82)
RDW: 14.8 % — ABNORMAL HIGH (ref 11.0–14.6)
WBC: 11.9 10*3/uL — ABNORMAL HIGH (ref 4.0–10.3)
lymph#: 3 10*3/uL (ref 0.9–3.3)

## 2013-11-04 LAB — COMPREHENSIVE METABOLIC PANEL (CC13)
ALK PHOS: 74 U/L (ref 40–150)
ALT: 14 U/L (ref 0–55)
AST: 30 U/L (ref 5–34)
Albumin: 3.4 g/dL — ABNORMAL LOW (ref 3.5–5.0)
Anion Gap: 10 mEq/L (ref 3–11)
BILIRUBIN TOTAL: 0.72 mg/dL (ref 0.20–1.20)
BUN: 16.3 mg/dL (ref 7.0–26.0)
CO2: 27 mEq/L (ref 22–29)
Calcium: 9.6 mg/dL (ref 8.4–10.4)
Chloride: 107 mEq/L (ref 98–109)
Creatinine: 0.8 mg/dL (ref 0.7–1.3)
GLUCOSE: 74 mg/dL (ref 70–140)
Potassium: 3.8 mEq/L (ref 3.5–5.1)
Sodium: 145 mEq/L (ref 136–145)
Total Protein: 6.7 g/dL (ref 6.4–8.3)

## 2013-11-04 MED ORDER — DENOSUMAB 120 MG/1.7ML ~~LOC~~ SOLN
120.0000 mg | Freq: Once | SUBCUTANEOUS | Status: AC
  Administered 2013-11-04: 120 mg via SUBCUTANEOUS
  Filled 2013-11-04: qty 1.7

## 2013-11-04 NOTE — Progress Notes (Signed)
Hematology and Oncology Follow Up Visit  [name] Mathews [id number] 1945-07-09 68 y.o. 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 He was inially diagnosed  2001, gleason score 4+5= 9.  Prior Therapy: 1. 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 He was started on hormone therapy, initially with Lupron   2. Due to  A rise in his PSA Casodex was added to firmagon in 2009.  3. Most recently, his PSA was up to 16, with castrate level testosterone and bone involvement based on a bone scan on 08/2101.  Current therapy:  Zytiga 1000 mg po daily started in 09/2011 for a PSA of 22. He is on Prednisone 5 mg twice a day with excellent response, PSA dropping initially from 33 to 2.34. Xgeva 120 mg started on 10/31/2011. Repeated every visit.  Lupron given by Dr. Janice Mathews.   Interim History: [name] Mathews presents today for a follow up visit. He continues to be on Zytiga and tolerating this well without complications. He does report some occasional episodes of his right ankle feeling sore and weak. He denied any trauma. The episodes are self limiting. He reports no bone pain, no GU symptoms. No abdominal pain, nausea, or vomiting. He has no fluid retention or leg swelling. He denied any shoulder pain, has not had any major changes in his performance status. He has continued to have excellent quality of life. He denies any fevers chills or sweats has not reported any weight loss or appetite changes.  Has not reported any hematochezia or melena.  He still able to perform work-related duties intermittently. He is able to work outside in the yard without any decline in his performance status.   Medications: I have reviewed the patient's current medications. Current  Outpatient Prescriptions  Medication Sig Dispense Refill  . abiraterone Acetate (ZYTIGA) 250 MG tablet Take 4 tablets (1,000 mg total) by mouth daily. Take on an empty stomach 1 hour before or 2 hours after a meal  120 tablet  0  . atorvastatin (LIPITOR) 10 MG tablet Take 10 mg by mouth daily.      . Calcium Carbonate (CALTRATE 600 PO) Take 600 mg by mouth 3 (three) times daily.      [name] denosumab (XGEVA) 120 MG/1.7ML SOLN Inject 120 mg into the skin every 30 (thirty) days.      . DULoxetine (CYMBALTA) 30 MG capsule Take 30 mg by mouth daily.      . fish oil-omega-3 fatty acids 1000 MG capsule Take 1 g by mouth daily.      . predniSONE (DELTASONE) 5 MG tablet Take 1 tablet (5 mg total) by mouth 2 (two) times daily.  60 tablet  1  . rOPINIRole (REQUIP) 2 MG tablet Take 2 mg by mouth 2 (two) times daily.      . vitamin B-12 (CYANOCOBALAMIN) 1000 MCG tablet Take 1,000 mcg by mouth daily.      [name] EPIPEN 2-PAK 0.3 MG/0.3ML SOAJ injection       . rizatriptan (MAXALT-MLT) 10 MG disintegrating tablet Take 10 mg by mouth daily.      . solifenacin (VESICARE) 5 MG tablet Take 10 mg by mouth daily.       No current facility-administered medications for this visit.  Allergies:  Allergies  Allergen Reactions  . Percocet [Oxycodone-Acetaminophen] Palpitations    Past Medical History, Surgical history, Social history, and Family History were reviewed and updated.  Review of Systems:  Remaining ROS negative.  Physical Exam: Blood pressure 136/67, pulse 60, temperature 97.5 F (36.4 C), temperature source Oral, resp. rate 18, height 5' 1.5" (1.562 m), weight 185 lb 6.4 oz (84.097 kg), SpO2 100.00%.  ECOG: 0  General appearance: alert awake appeared in no active distress. Head: Normocephalic, without obvious abnormality, atraumatic Neck: no adenopathy, no carotid bruit, no JVD, supple, symmetrical, trachea midline and thyroid not enlarged, symmetric, no tenderness/mass/nodules Lymph nodes:  Cervical, supraclavicular, and axillary nodes normal. Heart:regular rate and rhythm, S1, S2 normal, no murmur, click, rub or gallop Lung:chest clear, no wheezing, rales, normal symmetric air entry Abdomen: soft, non-tender, without masses or organomegaly EXT:no erythema, induration, or nodules Neurological: No deficits noted.  Lab Results: Lab Results  Component Value Date   WBC 11.9* 11/04/2013   HGB 11.8* 11/04/2013   HCT 37.1* 11/04/2013   MCV 82.9 11/04/2013   PLT 230 11/04/2013     Chemistry      Component Value Date/Time   NA 145 11/04/2013 1124   NA 143 01/14/2012 0935   K 3.8 11/04/2013 1124   K 4.4 01/14/2012 0935   CL 105 12/08/2012 1504   CL 107 01/14/2012 0935   CO2 27 11/04/2013 1124   CO2 29 01/14/2012 0935   BUN 16.3 11/04/2013 1124   BUN 11 01/14/2012 0935   CREATININE 0.8 11/04/2013 1124   CREATININE 0.83 01/14/2012 0935      Component Value Date/Time   CALCIUM 9.6 11/04/2013 1124   CALCIUM 9.5 01/14/2012 0935   ALKPHOS 74 11/04/2013 1124   ALKPHOS 87 01/14/2012 0935   AST 30 11/04/2013 1124   AST 18 01/14/2012 0935   ALT 14 11/04/2013 1124   ALT 14 01/14/2012 0935   BILITOT 0.72 11/04/2013 1124   BILITOT 0.7 01/14/2012 0935      Results for [name] Mathews (MRN [id number]) as of 09/23/2013 15:28  Ref. Range 12/08/2012 15:04 01/11/2013 08:08 02/22/2013 10:16 04/06/2013 09:48 05/10/2013 10:29 06/08/2013 09:31 07/06/2013 10:48 08/26/2013 14:09  PSA Latest Range: <=4.00 ng/mL 2.40 2.56 2.68 2.73 3.35 3.89 4.77 (H) 5.06 (H)    Impression and Plan: This is a pleasant 68 year old gentleman with the following issues:  1. 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 He has an elevated PSA up to 33 in May of 2013. PSA dropped to 2.40. PSA up slightly to 5.09. Despite the rise in his PSA the doubling time still over 6 months. He continues to be asymptomatic and tolerating this medication very well. The plan is to continue with  therapy with the same dose and schedule for now given that his PSA remains under control. PSA pending from today. If he develops rapid PSA doubling time then we will switch to a different salvage therapy.  2. Bone directed therapy. On Xgeva with calcium supplementation. Dental hygiene encouraged. Continue to educate him about complications such as osteonecrosis of the jaw.  3. Androgen deprivation: He is to continue Lupron under the care of Dr. Janice Mathews.   4. Follow-up: In 4-5 weeks      Carlton Adam, PA-C 5/15/20152:36 PM

## 2013-11-04 NOTE — Telephone Encounter (Signed)
RECEIVED A FAX FROM [facility]ERNING A CONFIRMATION OF PRESCRIPTION SHIPMENT FOR [facility] ON 11/03/13.

## 2013-11-04 NOTE — Telephone Encounter (Signed)
gv adn printed appts cehd adn avs for opt for June

## 2013-11-04 NOTE — Patient Instructions (Signed)
Denosumab injection  What is this medicine?  DENOSUMAB (den oh sue mab) slows bone breakdown. Prolia is used to treat osteoporosis in women after menopause and in men. Xgeva is used to prevent bone fractures and other bone problems caused by cancer bone metastases. Xgeva is also used to treat giant cell tumor of the bone.  This medicine may be used for other purposes; ask your health care provider or pharmacist if you have questions.  COMMON BRAND NAME(S): Prolia, XGEVA  What should I tell my health care provider before I take this medicine?  They need to know if you have any of these conditions:  -dental disease  -eczema  -infection or history of infections  -kidney disease or on dialysis  -low blood calcium or vitamin D  -malabsorption syndrome  -scheduled to have surgery or tooth extraction  -taking medicine that contains denosumab  -thyroid or parathyroid disease  -an unusual reaction to denosumab, other medicines, foods, dyes, or preservatives  -pregnant or trying to get pregnant  -breast-feeding  How should I use this medicine?  This medicine is for injection under the skin. It is given by a health care professional in a hospital or clinic setting.  If you are getting Prolia, a special MedGuide will be given to you by the pharmacist with each prescription and refill. Be sure to read this information carefully each time.  For Prolia, talk to your pediatrician regarding the use of this medicine in children. Special care may be needed. For Xgeva, talk to your pediatrician regarding the use of this medicine in children. While this drug may be prescribed for children as young as 13 years for selected conditions, precautions do apply.  Overdosage: If you think you've taken too much of this medicine contact a poison control center or emergency room at once.  Overdosage: If you think you have taken too much of this medicine contact a poison control center or emergency room at once.  NOTE: This medicine is only for  you. Do not share this medicine with others.  What if I miss a dose?  It is important not to miss your dose. Call your doctor or health care professional if you are unable to keep an appointment.  What may interact with this medicine?  Do not take this medicine with any of the following medications:  -other medicines containing denosumab  This medicine may also interact with the following medications:  -medicines that suppress the immune system  -medicines that treat cancer  -steroid medicines like prednisone or cortisone  This list may not describe all possible interactions. Give your health care provider a list of all the medicines, herbs, non-prescription drugs, or dietary supplements you use. Also tell them if you smoke, drink alcohol, or use illegal drugs. Some items may interact with your medicine.  What should I watch for while using this medicine?  Visit your doctor or health care professional for regular checks on your progress. Your doctor or health care professional may order blood tests and other tests to see how you are doing.  Call your doctor or health care professional if you get a cold or other infection while receiving this medicine. Do not treat yourself. This medicine may decrease your body's ability to fight infection.  You should make sure you get enough calcium and vitamin D while you are taking this medicine, unless your doctor tells you not to. Discuss the foods you eat and the vitamins you take with your health care professional.    See your dentist regularly. Brush and floss your teeth as directed. Before you have any dental work done, tell your dentist you are receiving this medicine.  Do not become pregnant while taking this medicine or for 5 months after stopping it. Women should inform their doctor if they wish to become pregnant or think they might be pregnant. There is a potential for serious side effects to an unborn child. Talk to your health care professional or pharmacist for more  information.  What side effects may I notice from receiving this medicine?  Side effects that you should report to your doctor or health care professional as soon as possible:  -allergic reactions like skin rash, itching or hives, swelling of the face, lips, or tongue  -breathing problems  -chest pain  -fast, irregular heartbeat  -feeling faint or lightheaded, falls  -fever, chills, or any other sign of infection  -muscle spasms, tightening, or twitches  -numbness or tingling  -skin blisters or bumps, or is dry, peels, or red  -slow healing or unexplained pain in the mouth or jaw  -unusual bleeding or bruising  Side effects that usually do not require medical attention (Report these to your doctor or health care professional if they continue or are bothersome.):  -muscle pain  -stomach upset, gas  This list may not describe all possible side effects. Call your doctor for medical advice about side effects. You may report side effects to FDA at 1-800-FDA-1088.  Where should I keep my medicine?  This medicine is only given in a clinic, doctor's office, or other health care setting and will not be stored at home.  NOTE: This sheet is a summary. It may not cover all possible information. If you have questions about this medicine, talk to your doctor, pharmacist, or health care provider.  © 2014, [employer]. (2011-12-08 12:37:47)

## 2013-11-05 LAB — PSA: PSA: 5.09 ng/mL — AB (ref <=4.00)

## 2013-11-08 NOTE — Patient Instructions (Signed)
Continue taking Zytiga as prescribed and continue Xgeva as ordered/scheduled Follow up in 4-5 weeks

## 2013-11-18 DIAGNOSIS — I10 Essential (primary) hypertension: Secondary | ICD-10-CM | POA: Diagnosis not present

## 2013-11-18 DIAGNOSIS — E7800 Pure hypercholesterolemia, unspecified: Secondary | ICD-10-CM | POA: Diagnosis not present

## 2013-11-18 DIAGNOSIS — R7309 Other abnormal glucose: Secondary | ICD-10-CM | POA: Diagnosis not present

## 2013-11-18 DIAGNOSIS — G589 Mononeuropathy, unspecified: Secondary | ICD-10-CM | POA: Diagnosis not present

## 2013-11-18 DIAGNOSIS — G44009 Cluster headache syndrome, unspecified, not intractable: Secondary | ICD-10-CM | POA: Diagnosis not present

## 2013-11-18 DIAGNOSIS — E538 Deficiency of other specified B group vitamins: Secondary | ICD-10-CM | POA: Diagnosis not present

## 2013-11-18 DIAGNOSIS — E669 Obesity, unspecified: Secondary | ICD-10-CM | POA: Diagnosis not present

## 2013-11-23 DIAGNOSIS — Z8669 Personal history of other diseases of the nervous system and sense organs: Secondary | ICD-10-CM | POA: Diagnosis not present

## 2013-11-23 DIAGNOSIS — Z79899 Other long term (current) drug therapy: Secondary | ICD-10-CM | POA: Diagnosis not present

## 2013-11-23 DIAGNOSIS — G43909 Migraine, unspecified, not intractable, without status migrainosus: Secondary | ICD-10-CM | POA: Diagnosis not present

## 2013-11-23 DIAGNOSIS — R030 Elevated blood-pressure reading, without diagnosis of hypertension: Secondary | ICD-10-CM | POA: Diagnosis not present

## 2013-11-23 DIAGNOSIS — Z8673 Personal history of transient ischemic attack (TIA), and cerebral infarction without residual deficits: Secondary | ICD-10-CM | POA: Diagnosis not present

## 2013-11-23 DIAGNOSIS — R4182 Altered mental status, unspecified: Secondary | ICD-10-CM | POA: Diagnosis not present

## 2013-11-30 ENCOUNTER — Other Ambulatory Visit: Payer: Self-pay | Admitting: *Deleted

## 2013-11-30 DIAGNOSIS — C61 Malignant neoplasm of prostate: Secondary | ICD-10-CM

## 2013-11-30 MED ORDER — ABIRATERONE ACETATE 250 MG PO TABS: 1000.0000 mg | ORAL_TABLET | Freq: Every day | ORAL | Status: DC

## 2013-11-30 NOTE — Telephone Encounter (Signed)
Refill request to MD desk for approval. 

## 2013-12-02 ENCOUNTER — Other Ambulatory Visit (HOSPITAL_BASED_OUTPATIENT_CLINIC_OR_DEPARTMENT_OTHER): Payer: Medicare Other

## 2013-12-02 ENCOUNTER — Encounter: Payer: Self-pay | Admitting: Oncology

## 2013-12-02 ENCOUNTER — Other Ambulatory Visit: Payer: Self-pay | Admitting: *Deleted

## 2013-12-02 ENCOUNTER — Telehealth: Payer: Self-pay | Admitting: Oncology

## 2013-12-02 ENCOUNTER — Ambulatory Visit (HOSPITAL_BASED_OUTPATIENT_CLINIC_OR_DEPARTMENT_OTHER): Payer: Medicare Other | Admitting: Oncology

## 2013-12-02 ENCOUNTER — Ambulatory Visit: Payer: Medicare Other

## 2013-12-02 VITALS — BP 157/77 | HR 60 | Temp 98.0°F | Resp 18 | Ht 61.5 in | Wt 184.2 lb

## 2013-12-02 DIAGNOSIS — C61 Malignant neoplasm of prostate: Secondary | ICD-10-CM

## 2013-12-02 DIAGNOSIS — C7951 Secondary malignant neoplasm of bone: Secondary | ICD-10-CM

## 2013-12-02 DIAGNOSIS — C7952 Secondary malignant neoplasm of bone marrow: Secondary | ICD-10-CM

## 2013-12-02 LAB — CBC WITH DIFFERENTIAL/PLATELET
BASO%: 0.5 % (ref 0.0–2.0)
Basophils Absolute: 0.1 10*3/uL (ref 0.0–0.1)
EOS ABS: 0 10*3/uL (ref 0.0–0.5)
EOS%: 0.2 % (ref 0.0–7.0)
HEMATOCRIT: 38.7 % (ref 38.4–49.9)
HGB: 12 g/dL — ABNORMAL LOW (ref 13.0–17.1)
LYMPH#: 2.3 10*3/uL (ref 0.9–3.3)
LYMPH%: 17.2 % (ref 14.0–49.0)
MCH: 25.8 pg — ABNORMAL LOW (ref 27.2–33.4)
MCHC: 30.9 g/dL — AB (ref 32.0–36.0)
MCV: 83.5 fL (ref 79.3–98.0)
MONO#: 0.6 10*3/uL (ref 0.1–0.9)
MONO%: 4.6 % (ref 0.0–14.0)
NEUT%: 77.5 % — ABNORMAL HIGH (ref 39.0–75.0)
NEUTROS ABS: 10.5 10*3/uL — AB (ref 1.5–6.5)
Platelets: 219 10*3/uL (ref 140–400)
RBC: 4.64 10*6/uL (ref 4.20–5.82)
RDW: 14.8 % — ABNORMAL HIGH (ref 11.0–14.6)
WBC: 13.6 10*3/uL — ABNORMAL HIGH (ref 4.0–10.3)

## 2013-12-02 LAB — COMPREHENSIVE METABOLIC PANEL (CC13)
ALT: 15 U/L (ref 0–55)
ANION GAP: 7 meq/L (ref 3–11)
AST: 23 U/L (ref 5–34)
Albumin: 3.5 g/dL (ref 3.5–5.0)
Alkaline Phosphatase: 80 U/L (ref 40–150)
BUN: 15.2 mg/dL (ref 7.0–26.0)
CO2: 32 meq/L — AB (ref 22–29)
Calcium: 9.8 mg/dL (ref 8.4–10.4)
Chloride: 104 mEq/L (ref 98–109)
Creatinine: 0.8 mg/dL (ref 0.7–1.3)
GLUCOSE: 100 mg/dL (ref 70–140)
Potassium: 4.7 mEq/L (ref 3.5–5.1)
SODIUM: 143 meq/L (ref 136–145)
TOTAL PROTEIN: 7 g/dL (ref 6.4–8.3)
Total Bilirubin: 0.26 mg/dL (ref 0.20–1.20)

## 2013-12-02 MED ORDER — PREDNISONE 5 MG PO TABS: 5.0000 mg | ORAL_TABLET | Freq: Two times a day (BID) | ORAL | Status: DC

## 2013-12-02 MED ORDER — DENOSUMAB 120 MG/1.7ML ~~LOC~~ SOLN
120.0000 mg | Freq: Once | SUBCUTANEOUS | Status: AC
  Administered 2013-12-02: 120 mg via SUBCUTANEOUS
  Filled 2013-12-02: qty 1.7

## 2013-12-02 NOTE — Progress Notes (Signed)
Hematology and Oncology Follow Up Visit  [name] Mathews [id number] September 25, 1945 68 y.o. 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 He was inially diagnosed  2001, gleason score 4+5= 9.  Prior Therapy: 1. 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 He was started on hormone therapy, initially with Lupron   2. Due to  A rise in his PSA Casodex was added to firmagon in 2009.  3. In 08/3011, his PSA was up to 16, with castrate level testosterone and bone involvement based on a bone scan on 08/2101.  Current therapy:  Zytiga 1000 mg po daily started in 09/2011 for a PSA of 22. He is on Prednisone 5 mg twice a day with excellent response, PSA dropping initially from 33 to 2.34. Xgeva 120 mg started on 10/31/2011. Repeated every visit.  Lupron given by Dr. Janice Norrie.   Interim History: [name] Mathews presents today for a follow up visit by himself. He continues to be on Zytiga and tolerating this well without complications. He does report some occasional episodes of his right ankle swelling as well as occasional right-sided rib pain and back pain. It is very infrequent and manageable. He denied any trauma. The episodes are self limiting. He reports no frequency urgency or hematuria. He reports no abdominal pain, nausea, or vomiting. He has no fluid retention or leg swelling. He denied any shoulder pain, has not had any major changes in his performance status. He has continued to have excellent quality of life. He denies any fevers chills or sweats has not reported any weight loss or appetite changes.  Has not reported any hematochezia or melena.  He still able to perform work-related duties intermittently. He is able to work outside in the yard without any decline in  his performance status. 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 Rest of his review of systems unremarkable.   Medications: I have reviewed the patient's current medications. Current Outpatient Prescriptions  Medication Sig Dispense Refill  . abiraterone Acetate (ZYTIGA) 250 MG tablet Take 4 tablets (1,000 mg total) by mouth daily. Take on an empty stomach 1 hour before or 2 hours after a meal  120 tablet  0  . atorvastatin (LIPITOR) 10 MG tablet Take 10 mg by mouth daily.      . Calcium Carbonate (CALTRATE 600 PO) Take 600 mg by mouth 3 (three) times daily.      [name] denosumab (XGEVA) 120 MG/1.7ML SOLN Inject 120 mg into the skin every 30 (thirty) days.      . DULoxetine (CYMBALTA) 30 MG capsule Take 30 mg by mouth daily.      [name] EPIPEN 2-PAK 0.3 MG/0.3ML SOAJ injection       . fish oil-omega-3 fatty acids 1000 MG capsule Take 1 g by mouth daily.      . rizatriptan (MAXALT-MLT) 10 MG disintegrating tablet Take 10 mg by mouth daily.      [name] rOPINIRole (REQUIP) 2 MG tablet Take 2 mg by mouth 2 (two) times daily.      . solifenacin (VESICARE) 5 MG tablet Take 10 mg by mouth daily.      . vitamin B-12 (CYANOCOBALAMIN) 1000 MCG tablet Take 1,000 mcg by mouth daily.      [name]  predniSONE (DELTASONE) 5 MG tablet Take 1 tablet (5 mg total) by mouth 2 (two) times daily.  60 tablet  1   Current Facility-Administered Medications  Medication Dose Route Frequency Provider Last Rate Last Dose  . denosumab (XGEVA) injection 120 mg  120 mg Subcutaneous Once Maryanna Shape, NP         Allergies:  Allergies  Allergen Reactions  . Percocet [Oxycodone-Acetaminophen] Palpitations    Past Medical History, Surgical history, Social history, and Family History were reviewed and updated.    Physical Exam: Blood pressure 157/77, pulse 60, temperature 98 F (36.7 C), temperature source Oral, resp. rate 18, height 5' 1.5" (1.562 m), weight 184 lb 3.2 oz (83.553 kg).  ECOG: 0  General  appearance: alert not in any distress. Head: Normocephalic, without obvious abnormality Neck: no adenopathy Lymph nodes: Cervical, supraclavicular, and axillary nodes normal. Heart:regular rate and rhythm, S1, S2 normal, no murmur, click, rub or gallop Lung:chest clear, no wheezing, rales, normal symmetric air entry Abdomen: soft, non-tender, without masses or organomegaly EXT:no erythema, induration, or nodules Skin examination: No rashes or lesions. No ecchymosis or petechiae appear  Lab Results: Lab Results  Component Value Date   WBC 13.6* 12/02/2013   HGB 12.0* 12/02/2013   HCT 38.7 12/02/2013   MCV 83.5 12/02/2013   PLT 219 12/02/2013     Chemistry      Component Value Date/Time   NA 145 11/04/2013 1124   NA 143 01/14/2012 0935   K 3.8 11/04/2013 1124   K 4.4 01/14/2012 0935   CL 105 12/08/2012 1504   CL 107 01/14/2012 0935   CO2 27 11/04/2013 1124   CO2 29 01/14/2012 0935   BUN 16.3 11/04/2013 1124   BUN 11 01/14/2012 0935   CREATININE 0.8 11/04/2013 1124   CREATININE 0.83 01/14/2012 0935      Component Value Date/Time   CALCIUM 9.6 11/04/2013 1124   CALCIUM 9.5 01/14/2012 0935   ALKPHOS 74 11/04/2013 1124   ALKPHOS 87 01/14/2012 0935   AST 30 11/04/2013 1124   AST 18 01/14/2012 0935   ALT 14 11/04/2013 1124   ALT 14 01/14/2012 0935   BILITOT 0.72 11/04/2013 1124   BILITOT 0.7 01/14/2012 0935      Results for MITCH, ARQUETTE (MRN [id number]) as of 12/02/2013 15:33  Ref. Range 08/26/2013 14:09 09/23/2013 14:51 11/04/2013 11:24  PSA Latest Range: <=4.00 ng/mL 5.06 (H) 3.68 5.09 (H)     Impression and Plan: This is a pleasant 68 year old gentleman with the following issues:  1. 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 He has an elevated PSA up to 33 in May of 2013. PSA dropped to 2.40. PSA up slightly to 5.09. Despite the rise in his PSA the doubling time close to 12 months. He continues to be asymptomatic and tolerating this  medication very well. The plan is to continue with therapy with the same dose and schedule for now given that his PSA remains under control. PSA pending from today. If he develops rapid PSA rise, we will consider a different agent possibly Xtandi or systemic chemotherapy.  2. Bone directed therapy. On Xgeva with calcium supplementation. Dental hygiene encouraged. Continue to educate him about complications such as osteonecrosis of the jaw. None reported at this time.  3. Androgen deprivation: He is to continue Lupron under the care of Dr. Janice Norrie.   4. Follow-up: In 4-5 weeks      Clorine Swing, MD  6/12/20153:40 PM

## 2013-12-02 NOTE — Telephone Encounter (Signed)
, °

## 2013-12-03 LAB — PSA: PSA: 6.57 ng/mL — ABNORMAL HIGH (ref <=4.00)

## 2013-12-09 NOTE — Telephone Encounter (Signed)
RECEIVED A FAX FROM [facility]ERNING A CONFIRMATION OF PRESCRIPTION SHIPMENT FOR [facility] ON 12/08/13.

## 2013-12-19 DIAGNOSIS — IMO0002 Reserved for concepts with insufficient information to code with codable children: Secondary | ICD-10-CM | POA: Diagnosis not present

## 2013-12-19 DIAGNOSIS — G608 Other hereditary and idiopathic neuropathies: Secondary | ICD-10-CM | POA: Diagnosis not present

## 2013-12-19 DIAGNOSIS — G2581 Restless legs syndrome: Secondary | ICD-10-CM | POA: Diagnosis not present

## 2013-12-19 DIAGNOSIS — G2589 Other specified extrapyramidal and movement disorders: Secondary | ICD-10-CM | POA: Diagnosis not present

## 2014-01-04 ENCOUNTER — Other Ambulatory Visit: Payer: Self-pay | Admitting: *Deleted

## 2014-01-04 NOTE — Telephone Encounter (Signed)
Refill request for Zytiga given to [facility] for appt on 7/17 for approval.

## 2014-01-06 ENCOUNTER — Telehealth: Payer: Self-pay | Admitting: Oncology

## 2014-01-06 ENCOUNTER — Ambulatory Visit (HOSPITAL_BASED_OUTPATIENT_CLINIC_OR_DEPARTMENT_OTHER): Payer: Medicare Other | Admitting: Physician Assistant

## 2014-01-06 ENCOUNTER — Ambulatory Visit (HOSPITAL_BASED_OUTPATIENT_CLINIC_OR_DEPARTMENT_OTHER): Payer: Medicare Other

## 2014-01-06 ENCOUNTER — Other Ambulatory Visit (HOSPITAL_BASED_OUTPATIENT_CLINIC_OR_DEPARTMENT_OTHER): Payer: Medicare Other

## 2014-01-06 ENCOUNTER — Other Ambulatory Visit: Payer: Self-pay | Admitting: *Deleted

## 2014-01-06 VITALS — BP 154/74 | HR 54 | Temp 98.4°F | Resp 18 | Ht 61.5 in | Wt 181.4 lb

## 2014-01-06 DIAGNOSIS — C61 Malignant neoplasm of prostate: Secondary | ICD-10-CM

## 2014-01-06 DIAGNOSIS — C7952 Secondary malignant neoplasm of bone marrow: Secondary | ICD-10-CM

## 2014-01-06 DIAGNOSIS — C7951 Secondary malignant neoplasm of bone: Secondary | ICD-10-CM | POA: Diagnosis not present

## 2014-01-06 LAB — CBC WITH DIFFERENTIAL/PLATELET
BASO%: 0.6 % (ref 0.0–2.0)
Basophils Absolute: 0.1 10*3/uL (ref 0.0–0.1)
EOS%: 0.8 % (ref 0.0–7.0)
Eosinophils Absolute: 0.1 10*3/uL (ref 0.0–0.5)
HCT: 40.4 % (ref 38.4–49.9)
HGB: 12.6 g/dL — ABNORMAL LOW (ref 13.0–17.1)
LYMPH%: 28.4 % (ref 14.0–49.0)
MCH: 25.9 pg — ABNORMAL LOW (ref 27.2–33.4)
MCHC: 31.2 g/dL — AB (ref 32.0–36.0)
MCV: 83.2 fL (ref 79.3–98.0)
MONO#: 0.7 10*3/uL (ref 0.1–0.9)
MONO%: 6.6 % (ref 0.0–14.0)
NEUT#: 7.2 10*3/uL — ABNORMAL HIGH (ref 1.5–6.5)
NEUT%: 63.6 % (ref 39.0–75.0)
PLATELETS: 195 10*3/uL (ref 140–400)
RBC: 4.86 10*6/uL (ref 4.20–5.82)
RDW: 15.4 % — ABNORMAL HIGH (ref 11.0–14.6)
WBC: 11.3 10*3/uL — ABNORMAL HIGH (ref 4.0–10.3)
lymph#: 3.2 10*3/uL (ref 0.9–3.3)

## 2014-01-06 LAB — COMPREHENSIVE METABOLIC PANEL (CC13)
ALK PHOS: 76 U/L (ref 40–150)
ALT: 16 U/L (ref 0–55)
ANION GAP: 7 meq/L (ref 3–11)
AST: 20 U/L (ref 5–34)
Albumin: 3.3 g/dL — ABNORMAL LOW (ref 3.5–5.0)
BUN: 12.6 mg/dL (ref 7.0–26.0)
CO2: 31 meq/L — AB (ref 22–29)
Calcium: 9.2 mg/dL (ref 8.4–10.4)
Chloride: 108 mEq/L (ref 98–109)
Creatinine: 0.8 mg/dL (ref 0.7–1.3)
Glucose: 102 mg/dl (ref 70–140)
Potassium: 4 mEq/L (ref 3.5–5.1)
SODIUM: 147 meq/L — AB (ref 136–145)
TOTAL PROTEIN: 6.7 g/dL (ref 6.4–8.3)
Total Bilirubin: 0.37 mg/dL (ref 0.20–1.20)

## 2014-01-06 MED ORDER — ABIRATERONE ACETATE 250 MG PO TABS: 1000.0000 mg | ORAL_TABLET | Freq: Every day | ORAL | Status: DC

## 2014-01-06 MED ORDER — DENOSUMAB 120 MG/1.7ML ~~LOC~~ SOLN
120.0000 mg | Freq: Once | SUBCUTANEOUS | Status: AC
  Administered 2014-01-06: 120 mg via SUBCUTANEOUS
  Filled 2014-01-06: qty 1.7

## 2014-01-06 NOTE — Telephone Encounter (Signed)
Pt confirmed labs/ov/injection per 07/17 POF, gave pt AVS...Marland KitchenMarland KitchenKJ

## 2014-01-07 LAB — PSA: PSA: 6.06 ng/mL — ABNORMAL HIGH (ref <=4.00)

## 2014-01-10 NOTE — Patient Instructions (Signed)
Continue Zytiga at the current dose Follow up in 1 month

## 2014-01-10 NOTE — Progress Notes (Signed)
Hematology and Oncology Follow Up Visit  [name] Mathews [id number] 1946-02-12 68 y.o. 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 He was inially diagnosed  2001, gleason score 4+5= 9.  Prior Therapy: 1. 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 He was started on hormone therapy, initially with Lupron   2. Due to  A rise in his PSA Casodex was added to firmagon in 2009.  3. In 08/3011, his PSA was up to 16, with castrate level testosterone and bone involvement based on a bone scan on 08/2101.  Current therapy:  Zytiga 1000 mg po daily started in 09/2011 for a PSA of 22. He is on Prednisone 5 mg twice a day with excellent response, PSA dropping initially from 33 to 2.34. Xgeva 120 mg started on 10/31/2011. Repeated every visit.  Lupron given by Dr. Janice Norrie.   Interim History: [name] Mathews presents today for a follow up visit by himself. He continues to be on Zytiga and tolerating this well without complications.  He reports no frequency urgency or hematuria. He reports no abdominal pain, nausea, or vomiting. He has no fluid retention or leg swelling. He denied any shoulder pain, has not had any major changes in his performance status. He has continued to have excellent quality of life. He denies any fevers chills or sweats has not reported any weight loss or appetite changes.  Has not reported any hematochezia or melena.  He still able to perform work-related duties intermittently. He is able to work outside in the yard without any decline in his performance status. 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 Remainder of his review of systems unremarkable.   Medications: I have reviewed the patient's current medications. Current  Outpatient Prescriptions  Medication Sig Dispense Refill  . abiraterone Acetate (ZYTIGA) 250 MG tablet Take 4 tablets (1,000 mg total) by mouth daily. Take on an empty stomach 1 hour before or 2 hours after a meal  120 tablet  0  . atorvastatin (LIPITOR) 10 MG tablet Take 10 mg by mouth daily.      . Calcium Carbonate (CALTRATE 600 PO) Take 600 mg by mouth 3 (three) times daily.      [name] denosumab (XGEVA) 120 MG/1.7ML SOLN Inject 120 mg into the skin every 30 (thirty) days.      . DULoxetine (CYMBALTA) 30 MG capsule Take 30 mg by mouth daily.      [name] EPIPEN 2-PAK 0.3 MG/0.3ML SOAJ injection       . fish oil-omega-3 fatty acids 1000 MG capsule Take 1 g by mouth daily.      . predniSONE (DELTASONE) 5 MG tablet Take 1 tablet (5 mg total) by mouth 2 (two) times daily.  60 tablet  1  . rOPINIRole (REQUIP) 2 MG tablet Take 2 mg by mouth 2 (two) times daily.      . solifenacin (VESICARE) 5 MG tablet Take 10 mg by mouth daily.      . vitamin B-12 (CYANOCOBALAMIN) 1000 MCG tablet Take 1,000 mcg by mouth daily.      . rizatriptan (MAXALT-MLT) 10 MG disintegrating tablet Take 10 mg by mouth daily.       No current facility-administered medications for this visit.  Allergies:  Allergies  Allergen Reactions  . Percocet [Oxycodone-Acetaminophen] Palpitations    Past Medical History, Surgical history, Social history, and Family History were reviewed and updated.    Physical Exam: Blood pressure 154/74, pulse 54, temperature 98.4 F (36.9 C), temperature source Oral, resp. rate 18, height 5' 1.5" (1.562 m), weight 181 lb 6.4 oz (82.283 kg).  ECOG: 0  General appearance: alert not in any distress. Head: Normocephalic, without obvious abnormality Neck: no adenopathy Lymph nodes: Cervical, supraclavicular, and axillary nodes normal. Heart:regular rate and rhythm, S1, S2 normal, no murmur, click, rub or gallop Lung:chest clear, no wheezing, rales, normal symmetric air entry Abdomen: soft,  non-tender, without masses or organomegaly EXT:no erythema, induration, or nodules Skin examination: No rashes or lesions. No ecchymosis or petechiae appear  Lab Results: Lab Results  Component Value Date   WBC 11.3* 01/06/2014   HGB 12.6* 01/06/2014   HCT 40.4 01/06/2014   MCV 83.2 01/06/2014   PLT 195 01/06/2014     Chemistry      Component Value Date/Time   NA 147* 01/06/2014 1026   NA 143 01/14/2012 0935   K 4.0 01/06/2014 1026   K 4.4 01/14/2012 0935   CL 105 12/08/2012 1504   CL 107 01/14/2012 0935   CO2 31* 01/06/2014 1026   CO2 29 01/14/2012 0935   BUN 12.6 01/06/2014 1026   BUN 11 01/14/2012 0935   CREATININE 0.8 01/06/2014 1026   CREATININE 0.83 01/14/2012 0935      Component Value Date/Time   CALCIUM 9.2 01/06/2014 1026   CALCIUM 9.5 01/14/2012 0935   ALKPHOS 76 01/06/2014 1026   ALKPHOS 87 01/14/2012 0935   AST 20 01/06/2014 1026   AST 18 01/14/2012 0935   ALT 16 01/06/2014 1026   ALT 14 01/14/2012 0935   BILITOT 0.37 01/06/2014 1026   BILITOT 0.7 01/14/2012 0935      Results for [name] Mathews (MRN [id number]) as of 12/02/2013 15:33  Ref. Range 08/26/2013 14:09 09/23/2013 14:51 11/04/2013 11:24  PSA Latest Range: <=4.00 ng/mL 5.06 (H) 3.68 5.09 (H)     Impression and Plan: This is a pleasant 68 year old gentleman with the following issues:  1. 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 He has an elevated PSA up to 33 in May of 2013. PSA dropped to 2.40. PSA was up slightly to 6.57 but now is slightly lower at 6.06 today. Despite the rise in his PSA the doubling time close to 12 months. He continues to be asymptomatic and tolerating this medication very well. The plan is to continue with therapy with the same dose and schedule for now given that his PSA remains under control.  If he develops rapid PSA rise, we will consider a different agent possibly Xtandi or systemic chemotherapy.  2. Bone directed therapy. On Xgeva with  calcium supplementation. Dental hygiene encouraged. Continue to educate him about complications such as osteonecrosis of the jaw. None reported at this time.  3. Androgen deprivation: He is to continue Lupron under the care of Dr. Janice Norrie.   4. Follow-up: In 4-5 weeks      Carlton Adam, PA-C  7/21/201510:12 AM

## 2014-01-16 NOTE — Telephone Encounter (Signed)
RECEIVED A FAX FROM [facility]ERNING A CONFIRMATION OF PRESCRIPTION SHIPMENT FOR [facility] ON 01/13/14.

## 2014-02-03 ENCOUNTER — Ambulatory Visit (HOSPITAL_BASED_OUTPATIENT_CLINIC_OR_DEPARTMENT_OTHER): Payer: Medicare Other

## 2014-02-03 ENCOUNTER — Ambulatory Visit (HOSPITAL_BASED_OUTPATIENT_CLINIC_OR_DEPARTMENT_OTHER): Payer: Medicare Other | Admitting: Oncology

## 2014-02-03 ENCOUNTER — Encounter: Payer: Self-pay | Admitting: Oncology

## 2014-02-03 ENCOUNTER — Other Ambulatory Visit (HOSPITAL_BASED_OUTPATIENT_CLINIC_OR_DEPARTMENT_OTHER): Payer: Medicare Other

## 2014-02-03 VITALS — BP 153/77 | HR 62 | Temp 99.2°F | Resp 18 | Ht 61.5 in | Wt 181.5 lb

## 2014-02-03 DIAGNOSIS — M545 Low back pain, unspecified: Secondary | ICD-10-CM | POA: Diagnosis not present

## 2014-02-03 DIAGNOSIS — C7952 Secondary malignant neoplasm of bone marrow: Secondary | ICD-10-CM

## 2014-02-03 DIAGNOSIS — C7951 Secondary malignant neoplasm of bone: Secondary | ICD-10-CM

## 2014-02-03 DIAGNOSIS — E291 Testicular hypofunction: Secondary | ICD-10-CM | POA: Diagnosis not present

## 2014-02-03 DIAGNOSIS — C61 Malignant neoplasm of prostate: Secondary | ICD-10-CM

## 2014-02-03 LAB — CBC WITH DIFFERENTIAL/PLATELET
BASO%: 0.8 % (ref 0.0–2.0)
Basophils Absolute: 0.1 10*3/uL (ref 0.0–0.1)
EOS%: 1.4 % (ref 0.0–7.0)
Eosinophils Absolute: 0.2 10*3/uL (ref 0.0–0.5)
HEMATOCRIT: 39.2 % (ref 38.4–49.9)
HEMOGLOBIN: 12.2 g/dL — AB (ref 13.0–17.1)
LYMPH%: 30 % (ref 14.0–49.0)
MCH: 25.7 pg — AB (ref 27.2–33.4)
MCHC: 31.2 g/dL — ABNORMAL LOW (ref 32.0–36.0)
MCV: 82.4 fL (ref 79.3–98.0)
MONO#: 0.8 10*3/uL (ref 0.1–0.9)
MONO%: 6.7 % (ref 0.0–14.0)
NEUT#: 7 10*3/uL — ABNORMAL HIGH (ref 1.5–6.5)
NEUT%: 61.1 % (ref 39.0–75.0)
Platelets: 223 10*3/uL (ref 140–400)
RBC: 4.76 10*6/uL (ref 4.20–5.82)
RDW: 15.2 % — ABNORMAL HIGH (ref 11.0–14.6)
WBC: 11.5 10*3/uL — ABNORMAL HIGH (ref 4.0–10.3)
lymph#: 3.4 10*3/uL — ABNORMAL HIGH (ref 0.9–3.3)

## 2014-02-03 LAB — COMPREHENSIVE METABOLIC PANEL (CC13)
ALK PHOS: 81 U/L (ref 40–150)
ALT: 9 U/L (ref 0–55)
AST: 17 U/L (ref 5–34)
Albumin: 3.1 g/dL — ABNORMAL LOW (ref 3.5–5.0)
Anion Gap: 10 mEq/L (ref 3–11)
BUN: 10.7 mg/dL (ref 7.0–26.0)
CALCIUM: 9.4 mg/dL (ref 8.4–10.4)
CO2: 31 mEq/L — ABNORMAL HIGH (ref 22–29)
CREATININE: 0.8 mg/dL (ref 0.7–1.3)
Chloride: 103 mEq/L (ref 98–109)
Glucose: 98 mg/dl (ref 70–140)
Potassium: 4.1 mEq/L (ref 3.5–5.1)
Sodium: 144 mEq/L (ref 136–145)
Total Bilirubin: 0.37 mg/dL (ref 0.20–1.20)
Total Protein: 6.5 g/dL (ref 6.4–8.3)

## 2014-02-03 MED ORDER — DENOSUMAB 120 MG/1.7ML ~~LOC~~ SOLN
120.0000 mg | Freq: Once | SUBCUTANEOUS | Status: AC
  Administered 2014-02-03: 120 mg via SUBCUTANEOUS
  Filled 2014-02-03: qty 1.7

## 2014-02-03 NOTE — Progress Notes (Signed)
Hematology and Oncology Follow Up Visit  [name] Mathews [id number] [birth year] 68 y.o. 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 He was inially diagnosed  2001, gleason score 4+5= 9.  Prior Therapy: 1. 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 He was started on hormone therapy, initially with Lupron   2. Due to  A rise in his PSA Casodex was added to firmagon in 2009.  3. In 08/3011, his PSA was up to 16, with castrate level testosterone and bone involvement based on a bone scan on 08/2101.  Current therapy:  Zytiga 1000 mg po daily started in 09/2011 for a PSA of 22. He is on Prednisone 5 mg twice a day with excellent response, PSA dropping initially from 33 to 2.34. Xgeva 120 mg started on 10/31/2011. Repeated every visit.  Lupron given by Dr. Janice Norrie.   Interim History: [name] Mathews presents today for a follow up visit by himself. He continues to be on Zytiga and tolerating this well without complications. He is reporting occasional right-sided rib pain and back pain. His right-sided lower back pain has becoming more noticeable as of late. It is noticeable predominately at nighttime after he have reduced his activities. Today, he is able to ambulate and work outside the house without any decline in he does not report any neurological deficits or lower extremity weakness. He denied any trauma. He reports no frequency urgency or hematuria. He reports no abdominal pain, nausea, or vomiting. He has no fluid retention or leg swelling. He denied any shoulder pain, has not had any major changes in his performance status. He has continued to have excellent quality of life. He denies any fevers chills or sweats has not reported any weight loss or appetite  changes.  Has not reported any hematochezia or melena.  He still able to perform work-related duties intermittently. He is able to work outside in the yard without any decline in his performance status. 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 Rest of his review of systems unremarkable.   Medications: I have reviewed the patient's current medications. Current Outpatient Prescriptions  Medication Sig Dispense Refill  . abiraterone Acetate (ZYTIGA) 250 MG tablet Take 4 tablets (1,000 mg total) by mouth daily. Take on an empty stomach 1 hour before or 2 hours after a meal  120 tablet  0  . atorvastatin (LIPITOR) 10 MG tablet Take 10 mg by mouth daily.      . Calcium Carbonate (CALTRATE 600 PO) Take 600 mg by mouth 3 (three) times daily.      [name] denosumab (XGEVA) 120 MG/1.7ML SOLN Inject 120 mg into the skin every 30 (thirty) days.      . DULoxetine (CYMBALTA) 30 MG capsule Take 30 mg by mouth daily.      [name] EPIPEN 2-PAK 0.3 MG/0.3ML SOAJ injection       . fish oil-omega-3 fatty acids 1000 MG capsule Take 1 g by mouth daily.      . predniSONE (DELTASONE) 5 MG tablet Take 1 tablet (5 mg total) by mouth 2 (two) times daily.  60 tablet  1  . rizatriptan (MAXALT-MLT) 10 MG disintegrating tablet Take 10 mg by mouth as needed.       [name] rOPINIRole (  REQUIP) 2 MG tablet Take 2 mg by mouth 2 (two) times daily.      . solifenacin (VESICARE) 5 MG tablet Take 10 mg by mouth daily.      . vitamin B-12 (CYANOCOBALAMIN) 1000 MCG tablet Take 1,000 mcg by mouth daily.       No current facility-administered medications for this visit.     Allergies:  Allergies  Allergen Reactions  . Percocet [Oxycodone-Acetaminophen] Palpitations    Past Medical History, Surgical history, Social history, and Family History were reviewed and updated.    Physical Exam: Blood pressure 153/77, pulse 62, temperature 99.2 F (37.3 C), temperature source Oral, resp. rate 18, height 5' 1.5" (1.562 m),  weight 181 lb 8 oz (82.328 kg).  ECOG: 0  General appearance: alert not in any distress. Head: Normocephalic, without obvious abnormality Neck: no adenopathy Lymph nodes: Cervical, supraclavicular, and axillary nodes normal. Heart:regular rate and rhythm, S1, S2 normal, no murmur, click, rub or gallop Lung:chest clear, no wheezing, rales, normal symmetric air entry Abdomen: soft, non-tender, without masses or organomegaly Back examination: No skin rashes or lesions. No point tenderness noted in his right flank. EXT:no erythema, induration, or nodules Skin examination: No rashes or lesions. No ecchymosis or petechiae appear Neurological examination: No motor or sensory deficits.   Lab Results: Lab Results  Component Value Date   WBC 11.5* 02/03/2014   HGB 12.2* 02/03/2014   HCT 39.2 02/03/2014   MCV 82.4 02/03/2014   PLT 223 02/03/2014     Chemistry      Component Value Date/Time   NA 147* 01/06/2014 1026   NA 143 01/14/2012 0935   K 4.0 01/06/2014 1026   K 4.4 01/14/2012 0935   CL 105 12/08/2012 1504   CL 107 01/14/2012 0935   CO2 31* 01/06/2014 1026   CO2 29 01/14/2012 0935   BUN 12.6 01/06/2014 1026   BUN 11 01/14/2012 0935   CREATININE 0.8 01/06/2014 1026   CREATININE 0.83 01/14/2012 0935      Component Value Date/Time   CALCIUM 9.2 01/06/2014 1026   CALCIUM 9.5 01/14/2012 0935   ALKPHOS 76 01/06/2014 1026   ALKPHOS 87 01/14/2012 0935   AST 20 01/06/2014 1026   AST 18 01/14/2012 0935   ALT 16 01/06/2014 1026   ALT 14 01/14/2012 0935   BILITOT 0.37 01/06/2014 1026   BILITOT 0.7 01/14/2012 0935       Results for GARREN, GREENMAN (MRN [id number]) as of 02/03/2014 08:47  Ref. Range 11/04/2013 11:24 12/02/2013 15:11 01/06/2014 10:26  PSA Latest Range: <=4.00 ng/mL 5.09 (H) 6.57 (H) 6.06 (H)     Impression and Plan: This is a pleasant 68 year old gentleman with the following issues:  1. 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 He has an elevated PSA up to 33 in May of 2013. PSA dropped to 2.40. PSA up slightly to 6.06. Despite the rise in his PSA the doubling time close to 12 months. He continues to be asymptomatic and tolerating this medication very well. The plan is to continue with therapy with the same dose and schedule for now given that his PSA remains under control.  I will repeat his bone scan for staging purposes before the next visit and continue to monitor his PSA closely.  2. Bone directed therapy. On Xgeva with calcium supplementation. Dental hygiene encouraged. Continue to educate him about complications such as osteonecrosis of the jaw. None reported at this time.  3. Androgen deprivation: He is to continue Lupron under the care of Dr. Janice Norrie.   4. Lower back pain: This could be related to increased prostate cancer in that area and imaging studies of the bone scan would help. He could also have osteoarthritis. He does not have any neurological changes and no signs or symptoms to suggest epidural involvement of the tumor. His pain is manageable with nonnarcotic analgesics at this time.  5. Followup: Would be in 1 month.      [facility], MD 8/14/20159:04 AM

## 2014-02-04 LAB — PSA: PSA: 6.59 ng/mL — ABNORMAL HIGH (ref <=4.00)

## 2014-02-08 ENCOUNTER — Other Ambulatory Visit: Payer: Self-pay | Admitting: *Deleted

## 2014-02-08 DIAGNOSIS — C61 Malignant neoplasm of prostate: Secondary | ICD-10-CM

## 2014-02-08 MED ORDER — ABIRATERONE ACETATE 250 MG PO TABS: 1000.0000 mg | ORAL_TABLET | Freq: Every day | ORAL | Status: DC

## 2014-02-09 ENCOUNTER — Telehealth: Payer: Self-pay | Admitting: Nurse Practitioner

## 2014-02-09 NOTE — Telephone Encounter (Signed)
labs/ov per 08/14 POF, mailed sch...KJ

## 2014-02-13 DIAGNOSIS — N529 Male erectile dysfunction, unspecified: Secondary | ICD-10-CM | POA: Diagnosis not present

## 2014-02-13 DIAGNOSIS — C61 Malignant neoplasm of prostate: Secondary | ICD-10-CM | POA: Diagnosis not present

## 2014-02-28 ENCOUNTER — Other Ambulatory Visit: Payer: Self-pay | Admitting: Oncology

## 2014-03-03 ENCOUNTER — Encounter: Payer: Self-pay | Admitting: *Deleted

## 2014-03-03 NOTE — Progress Notes (Signed)
RECEIVED A FAX FROM [facility] CONCERNING A CONFIRMATION OF PRESCRIPTION SHIPMENT FOR [facility] ON 03/02/14.

## 2014-03-07 ENCOUNTER — Ambulatory Visit (HOSPITAL_COMMUNITY)
Admission: RE | Admit: 2014-03-07 | Discharge: 2014-03-07 | Disposition: A | Discharge status: Home / Self Care | Payer: Medicare Other | Source: Ambulatory Visit | Attending: Oncology | Admitting: Oncology

## 2014-03-07 ENCOUNTER — Other Ambulatory Visit (HOSPITAL_BASED_OUTPATIENT_CLINIC_OR_DEPARTMENT_OTHER): Payer: Medicare Other

## 2014-03-07 ENCOUNTER — Encounter (HOSPITAL_COMMUNITY)
Admission: RE | Admit: 2014-03-07 | Discharge: 2014-03-07 | Disposition: A | Discharge status: Home / Self Care | Payer: Medicare Other | Source: Ambulatory Visit | Attending: Oncology | Admitting: Oncology

## 2014-03-07 DIAGNOSIS — C7951 Secondary malignant neoplasm of bone: Secondary | ICD-10-CM | POA: Insufficient documentation

## 2014-03-07 DIAGNOSIS — C61 Malignant neoplasm of prostate: Secondary | ICD-10-CM | POA: Insufficient documentation

## 2014-03-07 DIAGNOSIS — C7952 Secondary malignant neoplasm of bone marrow: Secondary | ICD-10-CM | POA: Diagnosis not present

## 2014-03-07 LAB — CBC WITH DIFFERENTIAL/PLATELET
BASO%: 0.7 % (ref 0.0–2.0)
BASOS ABS: 0.1 10*3/uL (ref 0.0–0.1)
EOS ABS: 0.1 10*3/uL (ref 0.0–0.5)
EOS%: 1 % (ref 0.0–7.0)
HEMATOCRIT: 36.9 % — AB (ref 38.4–49.9)
HEMOGLOBIN: 11.7 g/dL — AB (ref 13.0–17.1)
LYMPH#: 2.1 10*3/uL (ref 0.9–3.3)
LYMPH%: 27 % (ref 14.0–49.0)
MCH: 26.2 pg — ABNORMAL LOW (ref 27.2–33.4)
MCHC: 31.6 g/dL — ABNORMAL LOW (ref 32.0–36.0)
MCV: 82.9 fL (ref 79.3–98.0)
MONO#: 0.3 10*3/uL (ref 0.1–0.9)
MONO%: 4.4 % (ref 0.0–14.0)
NEUT%: 66.9 % (ref 39.0–75.0)
NEUTROS ABS: 5.1 10*3/uL (ref 1.5–6.5)
Platelets: 222 10*3/uL (ref 140–400)
RBC: 4.45 10*6/uL (ref 4.20–5.82)
RDW: 15.2 % — AB (ref 11.0–14.6)
WBC: 7.6 10*3/uL (ref 4.0–10.3)

## 2014-03-07 LAB — COMPREHENSIVE METABOLIC PANEL (CC13)
ALBUMIN: 3.2 g/dL — AB (ref 3.5–5.0)
ALT: 10 U/L (ref 0–55)
AST: 18 U/L (ref 5–34)
Alkaline Phosphatase: 69 U/L (ref 40–150)
Anion Gap: 11 mEq/L (ref 3–11)
BUN: 13.3 mg/dL (ref 7.0–26.0)
CALCIUM: 8.7 mg/dL (ref 8.4–10.4)
CHLORIDE: 106 meq/L (ref 98–109)
CO2: 29 meq/L (ref 22–29)
CREATININE: 0.8 mg/dL (ref 0.7–1.3)
Glucose: 153 mg/dl — ABNORMAL HIGH (ref 70–140)
Potassium: 3.1 mEq/L — ABNORMAL LOW (ref 3.5–5.1)
Sodium: 145 mEq/L (ref 136–145)
Total Bilirubin: 0.4 mg/dL (ref 0.20–1.20)
Total Protein: 6.7 g/dL (ref 6.4–8.3)

## 2014-03-07 MED ORDER — TECHNETIUM TC 99M MEDRONATE IV KIT: 27.1000 | PACK | Freq: Once | INTRAVENOUS | Status: AC | PRN

## 2014-03-07 MED ORDER — TECHNETIUM TC 99M MEDRONATE IV KIT
27.1000 | PACK | Freq: Once | INTRAVENOUS | Status: AC | PRN
  Administered 2014-03-07: 27.1 via INTRAVENOUS

## 2014-03-08 ENCOUNTER — Telehealth: Payer: Self-pay | Admitting: *Deleted

## 2014-03-08 LAB — PSA: PSA: 4.88 ng/mL — ABNORMAL HIGH (ref <=4.00)

## 2014-03-08 NOTE — Telephone Encounter (Signed)
Message copied by Randolm Idol on Wed Mar 08, 2014  2:45 PM ------      Message from: [name]      Created: Wed Mar 08, 2014  8:18 AM       Please call his PSA. Down this time. ------

## 2014-03-08 NOTE — Telephone Encounter (Signed)
Spoke with patient, gave results of PSA. 

## 2014-03-09 ENCOUNTER — Ambulatory Visit (HOSPITAL_BASED_OUTPATIENT_CLINIC_OR_DEPARTMENT_OTHER): Payer: Medicare Other | Admitting: Oncology

## 2014-03-09 ENCOUNTER — Encounter: Payer: Self-pay | Admitting: Oncology

## 2014-03-09 ENCOUNTER — Ambulatory Visit (HOSPITAL_BASED_OUTPATIENT_CLINIC_OR_DEPARTMENT_OTHER): Payer: Medicare Other

## 2014-03-09 ENCOUNTER — Ambulatory Visit: Payer: Medicare Other | Admitting: Nurse Practitioner

## 2014-03-09 ENCOUNTER — Telehealth: Payer: Self-pay | Admitting: Oncology

## 2014-03-09 VITALS — BP 137/86 | HR 72 | Temp 98.8°F | Resp 19 | Ht 61.0 in | Wt 177.4 lb

## 2014-03-09 DIAGNOSIS — N529 Male erectile dysfunction, unspecified: Secondary | ICD-10-CM | POA: Diagnosis not present

## 2014-03-09 DIAGNOSIS — Z23 Encounter for immunization: Secondary | ICD-10-CM

## 2014-03-09 DIAGNOSIS — E291 Testicular hypofunction: Secondary | ICD-10-CM

## 2014-03-09 DIAGNOSIS — C7951 Secondary malignant neoplasm of bone: Secondary | ICD-10-CM

## 2014-03-09 DIAGNOSIS — C61 Malignant neoplasm of prostate: Secondary | ICD-10-CM

## 2014-03-09 DIAGNOSIS — C7952 Secondary malignant neoplasm of bone marrow: Secondary | ICD-10-CM

## 2014-03-09 DIAGNOSIS — M545 Low back pain, unspecified: Secondary | ICD-10-CM | POA: Diagnosis not present

## 2014-03-09 DIAGNOSIS — M25559 Pain in unspecified hip: Secondary | ICD-10-CM | POA: Diagnosis not present

## 2014-03-09 MED ORDER — DENOSUMAB 120 MG/1.7ML ~~LOC~~ SOLN
120.0000 mg | Freq: Once | SUBCUTANEOUS | Status: DC
  Administered 2014-03-09: 120 mg via SUBCUTANEOUS
  Filled 2014-03-09: qty 1.7

## 2014-03-09 MED ORDER — HYDROCODONE-ACETAMINOPHEN 5-325MG PREPACK (~~LOC~~: ORAL_TABLET | ORAL | Status: DC

## 2014-03-09 MED ORDER — INFLUENZA VAC SPLIT QUAD 0.5 ML IM SUSY
0.5000 mL | PREFILLED_SYRINGE | Freq: Once | INTRAMUSCULAR | Status: AC
  Administered 2014-03-09: 0.5 mL via INTRAMUSCULAR
  Filled 2014-03-09: qty 0.5

## 2014-03-09 NOTE — Progress Notes (Signed)
Hematology and Oncology Follow Up Visit  GREGOREY NABOR [id number] December 18, 1945 68 y.o. 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 He was inially diagnosed  2001, gleason score 4+5= 9.  Prior Therapy: 1. 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 He was started on hormone therapy, initially with Lupron   2. Due to  A rise in his PSA Casodex was added to firmagon in 2009.  3. In 08/3011, his PSA was up to 16, with castrate level testosterone and bone involvement based on a bone scan on 08/2101.  Current therapy:  Zytiga 1000 mg po daily started in 09/2011 for a PSA of 22. He is on Prednisone 5 mg twice a day with excellent response, PSA dropping initially from 33 to 2.34. Xgeva 120 mg started on 10/31/2011. Repeated every visit.  Lupron given by Dr. Janice Norrie.   Interim History: Mr. Lemmerman presents today for a follow up visit by himself. He continues to be on Zytiga and tolerating this well without complications. He is reporting occasional right-sided hip pain which have gotten worse just in the last 24 hours. He had been pain-free for the last 2 weeks. He is able to ambulate and work outside the house without any decline in he does not report any neurological deficits or lower extremity weakness. He denied any trauma. He reports no frequency urgency or hematuria. He reports no abdominal pain, nausea, or vomiting. He has no fluid retention or leg swelling. He denied any shoulder pain, has not had any major changes in his performance status. He has continued to have excellent quality of life. He denies any fevers chills or sweats has not reported any weight loss or appetite changes.  Has not reported any hematochezia or melena.  He still able to perform  work-related duties intermittently. He is able to work outside in the yard without any decline in his performance status. 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 Rest of his review of systems unremarkable.   Medications: I have reviewed the patient's current medications. Current Outpatient Prescriptions  Medication Sig Dispense Refill  . abiraterone Acetate (ZYTIGA) 250 MG tablet Take 4 tablets (1,000 mg total) by mouth daily. Take on an empty stomach 1 hour before or 2 hours after a meal  120 tablet  0  . atorvastatin (LIPITOR) 10 MG tablet Take 10 mg by mouth daily.      . Calcium Carbonate (CALTRATE 600 PO) Take 600 mg by mouth 3 (three) times daily.      [name] denosumab (XGEVA) 120 MG/1.7ML SOLN Inject 120 mg into the skin every 30 (thirty) days.      . DULoxetine (CYMBALTA) 30 MG capsule Take 30 mg by mouth daily.      [name] EPIPEN 2-PAK 0.3 MG/0.3ML SOAJ injection       . fish oil-omega-3 fatty acids 1000 MG capsule Take 1 g by mouth daily.      [name] HYDROcodone-acetaminophen (VICODIN) 5-325 mg TABS tablet Take one tablet by mouth every 6 hours as needed.  30 tablet  0  . predniSONE (DELTASONE) 5 MG tablet TAKE 1 TABLET (5 MG TOTAL) BY MOUTH 2 (TWO) TIMES DAILY.  60 tablet  1  . rizatriptan (MAXALT-MLT) 10 MG disintegrating tablet Take  10 mg by mouth as needed.       [name] rOPINIRole (REQUIP) 2 MG tablet Take 2 mg by mouth 2 (two) times daily.      . solifenacin (VESICARE) 5 MG tablet Take 10 mg by mouth daily.      . vitamin B-12 (CYANOCOBALAMIN) 1000 MCG tablet Take 1,000 mcg by mouth daily.       Current Facility-Administered Medications  Medication Dose Route Frequency Provider Last Rate Last Dose  . denosumab (XGEVA) injection 120 mg  120 mg Subcutaneous Once Maryanna Shape, NP         Allergies:  Allergies  Allergen Reactions  . Percocet [Oxycodone-Acetaminophen] Palpitations    Past Medical History, Surgical history, Social history, and Family History were  reviewed and updated.    Physical Exam: Blood pressure 137/86, pulse 72, temperature 98.8 F (37.1 C), temperature source Oral, resp. rate 19, height 5\' 1"  (1.549 m), weight 177 lb 6.4 oz (80.468 kg), SpO2 99.00%.  ECOG: 0  General appearance: alert not in any distress. Head: Normocephalic, without obvious abnormality Neck: no adenopathy Lymph nodes: Cervical, supraclavicular, and axillary nodes normal. Heart:regular rate and rhythm, S1, S2 normal, no murmur, click, rub or gallop Lung:chest clear, no wheezing, rales, normal symmetric air entry Abdomen: soft, non-tender, without masses or organomegaly Back examination: No skin rashes or lesions. No point tenderness noted in his right flank. EXT:no erythema, induration, or nodules Skin examination: No rashes or lesions. No ecchymosis or petechiae appear Neurological examination: No motor or sensory deficits.   Lab Results: Lab Results  Component Value Date   WBC 7.6 03/07/2014   HGB 11.7* 03/07/2014   HCT 36.9* 03/07/2014   MCV 82.9 03/07/2014   PLT 222 03/07/2014     Chemistry      Component Value Date/Time   NA 145 03/07/2014 0937   NA 143 01/14/2012 0935   K 3.1* 03/07/2014 0937   K 4.4 01/14/2012 0935   CL 105 12/08/2012 1504   CL 107 01/14/2012 0935   CO2 29 03/07/2014 0937   CO2 29 01/14/2012 0935   BUN 13.3 03/07/2014 0937   BUN 11 01/14/2012 0935   CREATININE 0.8 03/07/2014 0937   CREATININE 0.83 01/14/2012 0935      Component Value Date/Time   CALCIUM 8.7 03/07/2014 0937   CALCIUM 9.5 01/14/2012 0935   ALKPHOS 69 03/07/2014 0937   ALKPHOS 87 01/14/2012 0935   AST 18 03/07/2014 0937   AST 18 01/14/2012 0935   ALT 10 03/07/2014 0937   ALT 14 01/14/2012 0935   BILITOT 0.40 03/07/2014 0937   BILITOT 0.7 01/14/2012 0935      Results for WOFFORD, STRATTON (MRN [id number]) as of 03/09/2014 14:41  Ref. Range 02/03/2014 08:35 03/07/2014 09:38  PSA Latest Range: <=4.00 ng/mL 6.59 (H) 4.88 (H)     EXAM:  NUCLEAR MEDICINE WHOLE BODY  BONE SCAN  TECHNIQUE:  Whole body anterior and posterior images were obtained approximately  3 hours after intravenous injection of radiopharmaceutical.  RADIOPHARMACEUTICALS: 27.1 mCi Technetium-99 MDP  COMPARISON: 09/07/2012.  FINDINGS:  There persistent foci of increased radiotracer uptake in the RIGHT  calvarium, RIGHT humeral head and in the thoracolumbar spine and  pelvic bones consistent with bony metastatic disease. The  radiotracer uptake is less pronounced than it was on the prior  examination likely representing treatment related effects. Soft  tissue uptake and excretion is within normal limits. Urinary  contamination is present over the groin. 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  IMPRESSION:  Distribution bony metastatic disease is unchanged. No new areas of  metastasis are identified. Overall, uptake is relatively decreased  compared to prior exam from 2013 which may represent treatment.    Impression and Plan: This is a pleasant 68 year old gentleman with the following issues:  1. 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 He has an elevated PSA up to 33 in May of 2013. PSA dropped to 2.40. PSA up slightly to 4.88. Bone scan obtained on 03/07/2014 as well as laboratory results were discussed with the patient today. It appears that his disease continued to be under excellent control with the help of Zytiga. The plan is to continue on the current dose and schedule. I see no reason for any salvage therapy despite the slight rise in his PSA.  2. Bone directed therapy. On Xgeva with calcium supplementation. Dental hygiene encouraged. Continue to educate him about complications such as osteonecrosis of the jaw. None reported at this time.  3. Androgen deprivation: He is to continue Lupron under the care of Dr. Janice Norrie.   4. Lower back pain/ hip pain: Bone scan did not show cancer in that area.  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 Given a prescription for hydrocodone for better pain control for the time being.  5. Followup: in 1 month.      [facility], MD 9/17/20152:39 PM

## 2014-03-09 NOTE — Telephone Encounter (Signed)
Pt confirmed labs/ov/inj per 09/17 POF, gave pt AVS..Marland KitchenKJ

## 2014-03-17 DIAGNOSIS — R7309 Other abnormal glucose: Secondary | ICD-10-CM | POA: Diagnosis not present

## 2014-03-17 DIAGNOSIS — G589 Mononeuropathy, unspecified: Secondary | ICD-10-CM | POA: Diagnosis not present

## 2014-03-17 DIAGNOSIS — I10 Essential (primary) hypertension: Secondary | ICD-10-CM | POA: Diagnosis not present

## 2014-03-17 DIAGNOSIS — E669 Obesity, unspecified: Secondary | ICD-10-CM | POA: Diagnosis not present

## 2014-03-17 DIAGNOSIS — E538 Deficiency of other specified B group vitamins: Secondary | ICD-10-CM | POA: Diagnosis not present

## 2014-03-17 DIAGNOSIS — E7800 Pure hypercholesterolemia, unspecified: Secondary | ICD-10-CM | POA: Diagnosis not present

## 2014-03-21 DIAGNOSIS — G2581 Restless legs syndrome: Secondary | ICD-10-CM | POA: Diagnosis not present

## 2014-03-21 DIAGNOSIS — G608 Other hereditary and idiopathic neuropathies: Secondary | ICD-10-CM | POA: Diagnosis not present

## 2014-03-21 DIAGNOSIS — G2589 Other specified extrapyramidal and movement disorders: Secondary | ICD-10-CM | POA: Diagnosis not present

## 2014-03-21 DIAGNOSIS — IMO0002 Reserved for concepts with insufficient information to code with codable children: Secondary | ICD-10-CM | POA: Diagnosis not present

## 2014-04-10 ENCOUNTER — Telehealth: Payer: Self-pay | Admitting: *Deleted

## 2014-04-10 NOTE — Telephone Encounter (Signed)
[facility] sent facsimile confirmation of Zytiga prescription shipment.  [name] Mathews was shipped on 04-07-2014 with next business day delivery or will contact office if significant delays in the delivery.

## 2014-04-14 ENCOUNTER — Ambulatory Visit (HOSPITAL_BASED_OUTPATIENT_CLINIC_OR_DEPARTMENT_OTHER): Payer: Medicare Other | Admitting: Physician Assistant

## 2014-04-14 ENCOUNTER — Telehealth: Payer: Self-pay | Admitting: Oncology

## 2014-04-14 ENCOUNTER — Other Ambulatory Visit: Payer: Medicare Other

## 2014-04-14 ENCOUNTER — Other Ambulatory Visit (HOSPITAL_BASED_OUTPATIENT_CLINIC_OR_DEPARTMENT_OTHER): Payer: Medicare Other

## 2014-04-14 ENCOUNTER — Encounter: Payer: Self-pay | Admitting: Physician Assistant

## 2014-04-14 ENCOUNTER — Ambulatory Visit (HOSPITAL_BASED_OUTPATIENT_CLINIC_OR_DEPARTMENT_OTHER): Payer: Medicare Other

## 2014-04-14 ENCOUNTER — Ambulatory Visit: Payer: Medicare Other | Admitting: Physician Assistant

## 2014-04-14 ENCOUNTER — Ambulatory Visit: Payer: Medicare Other

## 2014-04-14 VITALS — BP 165/86 | HR 67 | Temp 99.1°F | Resp 18 | Ht 61.0 in | Wt 181.4 lb

## 2014-04-14 DIAGNOSIS — C61 Malignant neoplasm of prostate: Secondary | ICD-10-CM

## 2014-04-14 DIAGNOSIS — C7951 Secondary malignant neoplasm of bone: Secondary | ICD-10-CM

## 2014-04-14 LAB — COMPREHENSIVE METABOLIC PANEL (CC13)
ALK PHOS: 76 U/L (ref 40–150)
ALT: 12 U/L (ref 0–55)
AST: 18 U/L (ref 5–34)
Albumin: 3.2 g/dL — ABNORMAL LOW (ref 3.5–5.0)
Anion Gap: 8 mEq/L (ref 3–11)
BILIRUBIN TOTAL: 0.49 mg/dL (ref 0.20–1.20)
BUN: 9.2 mg/dL (ref 7.0–26.0)
CO2: 29 mEq/L (ref 22–29)
Calcium: 9.5 mg/dL (ref 8.4–10.4)
Chloride: 106 mEq/L (ref 98–109)
Creatinine: 0.8 mg/dL (ref 0.7–1.3)
Glucose: 96 mg/dl (ref 70–140)
Potassium: 3.9 mEq/L (ref 3.5–5.1)
Sodium: 143 mEq/L (ref 136–145)
Total Protein: 6.6 g/dL (ref 6.4–8.3)

## 2014-04-14 LAB — CBC WITH DIFFERENTIAL/PLATELET
BASO%: 0.8 % (ref 0.0–2.0)
Basophils Absolute: 0.1 10*3/uL (ref 0.0–0.1)
EOS%: 0.2 % (ref 0.0–7.0)
Eosinophils Absolute: 0 10*3/uL (ref 0.0–0.5)
HCT: 39.3 % (ref 38.4–49.9)
HGB: 12.1 g/dL — ABNORMAL LOW (ref 13.0–17.1)
LYMPH#: 2 10*3/uL (ref 0.9–3.3)
LYMPH%: 16.3 % (ref 14.0–49.0)
MCH: 25.5 pg — AB (ref 27.2–33.4)
MCHC: 30.9 g/dL — AB (ref 32.0–36.0)
MCV: 82.7 fL (ref 79.3–98.0)
MONO#: 0.7 10*3/uL (ref 0.1–0.9)
MONO%: 5.4 % (ref 0.0–14.0)
NEUT#: 9.4 10*3/uL — ABNORMAL HIGH (ref 1.5–6.5)
NEUT%: 77.3 % — ABNORMAL HIGH (ref 39.0–75.0)
PLATELETS: 214 10*3/uL (ref 140–400)
RBC: 4.75 10*6/uL (ref 4.20–5.82)
RDW: 14.9 % — ABNORMAL HIGH (ref 11.0–14.6)
WBC: 12.2 10*3/uL — ABNORMAL HIGH (ref 4.0–10.3)

## 2014-04-14 MED ORDER — HYDROCODONE-ACETAMINOPHEN 5-325MG PREPACK (~~LOC~~: ORAL_TABLET | ORAL | Status: DC

## 2014-04-14 MED ORDER — DENOSUMAB 120 MG/1.7ML ~~LOC~~ SOLN
120.0000 mg | Freq: Once | SUBCUTANEOUS | Status: AC
  Administered 2014-04-14: 120 mg via SUBCUTANEOUS
  Filled 2014-04-14: qty 1.7

## 2014-04-14 MED ORDER — PREDNISONE 5 MG PO TABS: ORAL_TABLET | ORAL | Status: DC

## 2014-04-14 NOTE — Patient Instructions (Signed)
Denosumab injection What is this medicine? DENOSUMAB (den oh sue mab) slows bone breakdown. Prolia is used to treat osteoporosis in women after menopause and in men. Xgeva is used to prevent bone fractures and other bone problems caused by cancer bone metastases. Xgeva is also used to treat giant cell tumor of the bone. This medicine may be used for other purposes; ask your health care provider or pharmacist if you have questions. COMMON BRAND NAME(S): Prolia, XGEVA What should I tell my health care provider before I take this medicine? They need to know if you have any of these conditions: -dental disease -eczema -infection or history of infections -kidney disease or on dialysis -low blood calcium or vitamin D -malabsorption syndrome -scheduled to have surgery or tooth extraction -taking medicine that contains denosumab -thyroid or parathyroid disease -an unusual reaction to denosumab, other medicines, foods, dyes, or preservatives -pregnant or trying to get pregnant -breast-feeding How should I use this medicine? This medicine is for injection under the skin. It is given by a health care professional in a hospital or clinic setting. If you are getting Prolia, a special MedGuide will be given to you by the pharmacist with each prescription and refill. Be sure to read this information carefully each time. For Prolia, talk to your pediatrician regarding the use of this medicine in children. Special care may be needed. For Xgeva, talk to your pediatrician regarding the use of this medicine in children. While this drug may be prescribed for children as young as 13 years for selected conditions, precautions do apply. Overdosage: If you think you've taken too much of this medicine contact a poison control center or emergency room at once. Overdosage: If you think you have taken too much of this medicine contact a poison control center or emergency room at once. NOTE: This medicine is only for  you. Do not share this medicine with others. What if I miss a dose? It is important not to miss your dose. Call your doctor or health care professional if you are unable to keep an appointment. What may interact with this medicine? Do not take this medicine with any of the following medications: -other medicines containing denosumab This medicine may also interact with the following medications: -medicines that suppress the immune system -medicines that treat cancer -steroid medicines like prednisone or cortisone This list may not describe all possible interactions. Give your health care provider a list of all the medicines, herbs, non-prescription drugs, or dietary supplements you use. Also tell them if you smoke, drink alcohol, or use illegal drugs. Some items may interact with your medicine. What should I watch for while using this medicine? Visit your doctor or health care professional for regular checks on your progress. Your doctor or health care professional may order blood tests and other tests to see how you are doing. Call your doctor or health care professional if you get a cold or other infection while receiving this medicine. Do not treat yourself. This medicine may decrease your body's ability to fight infection. You should make sure you get enough calcium and vitamin D while you are taking this medicine, unless your doctor tells you not to. Discuss the foods you eat and the vitamins you take with your health care professional. See your dentist regularly. Brush and floss your teeth as directed. Before you have any dental work done, tell your dentist you are receiving this medicine. Do not become pregnant while taking this medicine or for 5 months after stopping   it. Women should inform their doctor if they wish to become pregnant or think they might be pregnant. There is a potential for serious side effects to an unborn child. Talk to your health care professional or pharmacist for more  information. What side effects may I notice from receiving this medicine? Side effects that you should report to your doctor or health care professional as soon as possible: -allergic reactions like skin rash, itching or hives, swelling of the face, lips, or tongue -breathing problems -chest pain -fast, irregular heartbeat -feeling faint or lightheaded, falls -fever, chills, or any other sign of infection -muscle spasms, tightening, or twitches -numbness or tingling -skin blisters or bumps, or is dry, peels, or red -slow healing or unexplained pain in the mouth or jaw -unusual bleeding or bruising Side effects that usually do not require medical attention (Report these to your doctor or health care professional if they continue or are bothersome.): -muscle pain -stomach upset, gas This list may not describe all possible side effects. Call your doctor for medical advice about side effects. You may report side effects to FDA at 1-800-FDA-1088. Where should I keep my medicine? This medicine is only given in a clinic, doctor's office, or other health care setting and will not be stored at home. NOTE: This sheet is a summary. It may not cover all possible information. If you have questions about this medicine, talk to your doctor, pharmacist, or health care provider.  2015, [employer]vier/Gold Standard. (2011-12-08 12:37:47)  

## 2014-04-14 NOTE — Telephone Encounter (Signed)
gv adn pritned appt sched and avs for pt for NOV.[name] KitchenMarland KitchenMarland Mathews

## 2014-04-14 NOTE — Progress Notes (Signed)
Hematology and Oncology Follow Up Visit  [name] Mathews [id number] 28-Oct-1945 68 y.o. 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 He was inially diagnosed  2001, gleason score 4+5= 9.  Prior Therapy: 1. 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 He was started on hormone therapy, initially with Lupron   2. Due to  A rise in his PSA Casodex was added to firmagon in 2009.  3. In 08/3011, his PSA was up to 16, with castrate level testosterone and bone involvement based on a bone scan on 08/2101.  Current therapy:  Zytiga 1000 mg po daily started in 09/2011 for a PSA of 22. He is on Prednisone 5 mg twice a day with excellent response, PSA dropping initially from 33 to 2.34. Xgeva 120 mg started on 10/31/2011. Repeated every visit.  Lupron given by Dr. Janice Norrie.   Interim History: [name] Mathews presents today for a follow up visit unaccompanied. He continues to be on Zytiga and tolerating this well without complications. He reports helping his nephew move over the span of 2 days. He had no joint issues while he was helping with the move however certainly this morning he had some back pain. It has since subsided. He is able to ambulate and work outside the house without any decline in he does not report any neurological deficits or lower extremity weakness. He denied any trauma. He reports no frequency urgency or hematuria. He reports no abdominal pain, nausea, or vomiting. He has no fluid retention or leg swelling. He denied any shoulder pain, has not had any major changes in his performance status. He has continued to have excellent quality of life. He denies any fevers chills or sweats has not reported any weight loss or appetite changes.  Has not reported any  hematochezia or melena.  He still able to perform work-related duties intermittently. He is able to work outside in the yard without any decline in his performance status. 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 Remainder of his review of systems is unremarkable.   Medications: I have reviewed the patient's current medications. Current Outpatient Prescriptions  Medication Sig Dispense Refill  . abiraterone Acetate (ZYTIGA) 250 MG tablet Take 4 tablets (1,000 mg total) by mouth daily. Take on an empty stomach 1 hour before or 2 hours after a meal  120 tablet  0  . atorvastatin (LIPITOR) 10 MG tablet Take 10 mg by mouth daily.      . Calcium Carbonate (CALTRATE 600 PO) Take 600 mg by mouth 3 (three) times daily.      [name] denosumab (XGEVA) 120 MG/1.7ML SOLN Inject 120 mg into the skin every 30 (thirty) days.      . DULoxetine (CYMBALTA) 30 MG capsule Take 30 mg by mouth daily.      [name] EPIPEN 2-PAK 0.3 MG/0.3ML SOAJ injection       . fish oil-omega-3 fatty acids 1000 MG capsule Take 1 g by mouth daily.      [name] HYDROcodone-acetaminophen (VICODIN) 5-325 mg TABS tablet Take one tablet by mouth every 6 hours as needed.  30 tablet  0  . predniSONE (DELTASONE) 5 MG tablet TAKE 1 TABLET (5 MG TOTAL) BY MOUTH 2 (TWO) TIMES DAILY.  60 tablet  1  . rizatriptan (MAXALT-MLT) 10 MG disintegrating tablet Take 10 mg by mouth as needed.       [name] rOPINIRole (REQUIP) 2 MG tablet Take 2 mg by mouth 2 (two) times daily.      . solifenacin (VESICARE) 5 MG tablet Take 10 mg by mouth daily.      . vitamin B-12 (CYANOCOBALAMIN) 1000 MCG tablet Take 1,000 mcg by mouth daily.       No current facility-administered medications for this visit.     Allergies:  Allergies  Allergen Reactions  . Percocet [Oxycodone-Acetaminophen] Palpitations    Past Medical History, Surgical history, Social history, and Family History were reviewed and updated.    Physical Exam: Blood pressure 165/86, pulse  67, temperature 99.1 F (37.3 C), temperature source Oral, resp. rate 18, height 5\' 1"  (1.549 m), weight 181 lb 6.4 oz (82.283 kg).  ECOG: 0  General appearance: alert not in any distress. Head: Normocephalic, without obvious abnormality Neck: no adenopathy Lymph nodes: Cervical, supraclavicular, and axillary nodes normal. Heart:regular rate and rhythm, S1, S2 normal, no murmur, click, rub or gallop Lung:chest clear, no wheezing, rales, normal symmetric air entry Abdomen: soft, non-tender, without masses or organomegaly Back examination: No skin rashes or lesions. No point tenderness noted in his right flank. EXT:no erythema, induration, or nodules Skin examination: No rashes or lesions. No ecchymosis or petechiae appear Neurological examination: No motor or sensory deficits.   Lab Results: Lab Results  Component Value Date   WBC 12.2* 04/14/2014   HGB 12.1* 04/14/2014   HCT 39.3 04/14/2014   MCV 82.7 04/14/2014   PLT 214 04/14/2014     Chemistry      Component Value Date/Time   NA 145 03/07/2014 0937   NA 143 01/14/2012 0935   K 3.1* 03/07/2014 0937   K 4.4 01/14/2012 0935   CL 105 12/08/2012 1504   CL 107 01/14/2012 0935   CO2 29 03/07/2014 0937   CO2 29 01/14/2012 0935   BUN 13.3 03/07/2014 0937   BUN 11 01/14/2012 0935   CREATININE 0.8 03/07/2014 0937   CREATININE 0.83 01/14/2012 0935      Component Value Date/Time   CALCIUM 8.7 03/07/2014 0937   CALCIUM 9.5 01/14/2012 0935   ALKPHOS 69 03/07/2014 0937   ALKPHOS 87 01/14/2012 0935   AST 18 03/07/2014 0937   AST 18 01/14/2012 0935   ALT 10 03/07/2014 0937   ALT 14 01/14/2012 0935   BILITOT 0.40 03/07/2014 0937   BILITOT 0.7 01/14/2012 0935      Results for [name], Mathews (MRN [id number]) as of 03/09/2014 14:41  Ref. Range 02/03/2014 08:35 03/07/2014 09:38  PSA Latest Range: <=4.00 ng/mL 6.59 (H) 4.88 (H)     EXAM:  NUCLEAR MEDICINE WHOLE BODY BONE SCAN  TECHNIQUE:  Whole body anterior and posterior images were obtained  approximately  3 hours after intravenous injection of radiopharmaceutical.  RADIOPHARMACEUTICALS: 27.1 mCi Technetium-99 MDP  COMPARISON: 09/07/2012.  FINDINGS:  There persistent foci of increased radiotracer uptake in the RIGHT  calvarium, RIGHT humeral head and in the thoracolumbar spine and  pelvic bones consistent with bony metastatic disease. The  radiotracer uptake is less pronounced than it was on the prior  examination likely representing treatment related effects. Soft  tissue uptake and excretion is within normal limits. Urinary  contamination is present over the groin. 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  IMPRESSION:  Distribution bony metastatic disease is unchanged. No new areas of  metastasis are identified. Overall, uptake is relatively decreased  compared to prior exam from 2013 which may represent treatment.    Impression and Plan: This is a pleasant 68 year old gentleman with the following issues:  1. 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 He has an elevated PSA up to 33 in May of 2013. PSA dropped to 2.40. PSA up slightly to 4.88. Bone scan obtained on 03/07/2014 as well as laboratory results were discussed with the patient today. It appears that his disease continued to be under excellent control with the help of Zytiga. The plan is to continue on the current dose and schedule. No current reason for any salvage therapy despite the slight rise in his PSA.  2. Bone directed therapy. On Xgeva with calcium supplementation. Dental hygiene encouraged. Continue to educate him about complications such as osteonecrosis of the jaw. None reported at this time.  3. Androgen deprivation: He is to continue Lupron under the care of Dr. Janice Norrie.   4. Lower back pain/ hip pain: Bone scan did not show cancer in that area. 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 Given a prescription for hydrocodone forpain  control.  5. Followup: in 1 month.      Wynetta Emery, Dayon Witt E, PA-C  10/23/20153:53 PM

## 2014-04-15 LAB — PSA: PSA: 6.75 ng/mL — ABNORMAL HIGH (ref <=4.00)

## 2014-04-16 NOTE — Patient Instructions (Signed)
Continues Zytiga at the current dose Followup in one month

## 2014-05-04 ENCOUNTER — Other Ambulatory Visit: Payer: Self-pay | Admitting: *Deleted

## 2014-05-04 ENCOUNTER — Other Ambulatory Visit: Payer: Self-pay | Admitting: Medical Oncology

## 2014-05-04 DIAGNOSIS — C61 Malignant neoplasm of prostate: Secondary | ICD-10-CM

## 2014-05-04 MED ORDER — ABIRATERONE ACETATE 250 MG PO TABS: 1000.0000 mg | ORAL_TABLET | Freq: Every day | ORAL | Status: DC

## 2014-05-04 NOTE — Telephone Encounter (Signed)
Confirmation of receipt of script for Zytiga received from [facility].

## 2014-05-04 NOTE — Telephone Encounter (Signed)
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

## 2014-05-04 NOTE — Telephone Encounter (Signed)
Prescription refill for zytiga faxed to [facility] @ [phone]

## 2014-05-09 NOTE — Telephone Encounter (Signed)
RECEIVED A FAX FROM [facility] CONCERNING A CONFIRMATION OF PRESCRIPTION SHIPMENT FOR [name] Mathews ON 05/08/14.

## 2014-05-12 ENCOUNTER — Ambulatory Visit (HOSPITAL_BASED_OUTPATIENT_CLINIC_OR_DEPARTMENT_OTHER): Payer: Medicare Other

## 2014-05-12 ENCOUNTER — Ambulatory Visit (HOSPITAL_BASED_OUTPATIENT_CLINIC_OR_DEPARTMENT_OTHER): Payer: Medicare Other | Admitting: Oncology

## 2014-05-12 ENCOUNTER — Telehealth: Payer: Self-pay | Admitting: Oncology

## 2014-05-12 ENCOUNTER — Other Ambulatory Visit (HOSPITAL_BASED_OUTPATIENT_CLINIC_OR_DEPARTMENT_OTHER): Payer: Medicare Other

## 2014-05-12 DIAGNOSIS — C61 Malignant neoplasm of prostate: Secondary | ICD-10-CM

## 2014-05-12 DIAGNOSIS — C7951 Secondary malignant neoplasm of bone: Secondary | ICD-10-CM

## 2014-05-12 DIAGNOSIS — E291 Testicular hypofunction: Secondary | ICD-10-CM

## 2014-05-12 DIAGNOSIS — M25559 Pain in unspecified hip: Secondary | ICD-10-CM | POA: Diagnosis not present

## 2014-05-12 DIAGNOSIS — M545 Low back pain: Secondary | ICD-10-CM | POA: Diagnosis not present

## 2014-05-12 LAB — CBC WITH DIFFERENTIAL/PLATELET
BASO%: 0.1 % (ref 0.0–2.0)
Basophils Absolute: 0 10*3/uL (ref 0.0–0.1)
EOS ABS: 0.1 10*3/uL (ref 0.0–0.5)
EOS%: 0.6 % (ref 0.0–7.0)
HCT: 40.7 % (ref 38.4–49.9)
HEMOGLOBIN: 12.8 g/dL — AB (ref 13.0–17.1)
LYMPH%: 23.4 % (ref 14.0–49.0)
MCH: 26.3 pg — ABNORMAL LOW (ref 27.2–33.4)
MCHC: 31.4 g/dL — ABNORMAL LOW (ref 32.0–36.0)
MCV: 83.6 fL (ref 79.3–98.0)
MONO#: 0.8 10*3/uL (ref 0.1–0.9)
MONO%: 8.1 % (ref 0.0–14.0)
NEUT%: 67.8 % (ref 39.0–75.0)
NEUTROS ABS: 7 10*3/uL — AB (ref 1.5–6.5)
PLATELETS: 242 10*3/uL (ref 140–400)
RBC: 4.87 10*6/uL (ref 4.20–5.82)
RDW: 15 % — ABNORMAL HIGH (ref 11.0–14.6)
WBC: 10.3 10*3/uL (ref 4.0–10.3)
lymph#: 2.4 10*3/uL (ref 0.9–3.3)

## 2014-05-12 LAB — COMPREHENSIVE METABOLIC PANEL (CC13)
ALBUMIN: 3.5 g/dL (ref 3.5–5.0)
ALK PHOS: 91 U/L (ref 40–150)
ALT: 9 U/L (ref 0–55)
ANION GAP: 12 meq/L — AB (ref 3–11)
AST: 18 U/L (ref 5–34)
BUN: 14.9 mg/dL (ref 7.0–26.0)
CO2: 29 meq/L (ref 22–29)
Calcium: 10.1 mg/dL (ref 8.4–10.4)
Chloride: 104 mEq/L (ref 98–109)
Creatinine: 0.9 mg/dL (ref 0.7–1.3)
Glucose: 94 mg/dl (ref 70–140)
Potassium: 4 mEq/L (ref 3.5–5.1)
Sodium: 145 mEq/L (ref 136–145)
Total Bilirubin: 0.3 mg/dL (ref 0.20–1.20)
Total Protein: 7 g/dL (ref 6.4–8.3)

## 2014-05-12 MED ORDER — DENOSUMAB 120 MG/1.7ML ~~LOC~~ SOLN
120.0000 mg | Freq: Once | SUBCUTANEOUS | Status: AC
  Administered 2014-05-12: 120 mg via SUBCUTANEOUS
  Filled 2014-05-12: qty 1.7

## 2014-05-12 NOTE — Patient Instructions (Signed)
Denosumab injection What is this medicine? DENOSUMAB (den oh sue mab) slows bone breakdown. Prolia is used to treat osteoporosis in women after menopause and in men. Xgeva is used to prevent bone fractures and other bone problems caused by cancer bone metastases. Xgeva is also used to treat giant cell tumor of the bone. This medicine may be used for other purposes; ask your health care provider or pharmacist if you have questions. COMMON BRAND NAME(S): Prolia, XGEVA What should I tell my health care provider before I take this medicine? They need to know if you have any of these conditions: -dental disease -eczema -infection or history of infections -kidney disease or on dialysis -low blood calcium or vitamin D -malabsorption syndrome -scheduled to have surgery or tooth extraction -taking medicine that contains denosumab -thyroid or parathyroid disease -an unusual reaction to denosumab, other medicines, foods, dyes, or preservatives -pregnant or trying to get pregnant -breast-feeding How should I use this medicine? This medicine is for injection under the skin. It is given by a health care professional in a hospital or clinic setting. If you are getting Prolia, a special MedGuide will be given to you by the pharmacist with each prescription and refill. Be sure to read this information carefully each time. For Prolia, talk to your pediatrician regarding the use of this medicine in children. Special care may be needed. For Xgeva, talk to your pediatrician regarding the use of this medicine in children. While this drug may be prescribed for children as young as 13 years for selected conditions, precautions do apply. Overdosage: If you think you've taken too much of this medicine contact a poison control center or emergency room at once. Overdosage: If you think you have taken too much of this medicine contact a poison control center or emergency room at once. NOTE: This medicine is only for  you. Do not share this medicine with others. What if I miss a dose? It is important not to miss your dose. Call your doctor or health care professional if you are unable to keep an appointment. What may interact with this medicine? Do not take this medicine with any of the following medications: -other medicines containing denosumab This medicine may also interact with the following medications: -medicines that suppress the immune system -medicines that treat cancer -steroid medicines like prednisone or cortisone This list may not describe all possible interactions. Give your health care provider a list of all the medicines, herbs, non-prescription drugs, or dietary supplements you use. Also tell them if you smoke, drink alcohol, or use illegal drugs. Some items may interact with your medicine. What should I watch for while using this medicine? Visit your doctor or health care professional for regular checks on your progress. Your doctor or health care professional may order blood tests and other tests to see how you are doing. Call your doctor or health care professional if you get a cold or other infection while receiving this medicine. Do not treat yourself. This medicine may decrease your body's ability to fight infection. You should make sure you get enough calcium and vitamin D while you are taking this medicine, unless your doctor tells you not to. Discuss the foods you eat and the vitamins you take with your health care professional. See your dentist regularly. Brush and floss your teeth as directed. Before you have any dental work done, tell your dentist you are receiving this medicine. Do not become pregnant while taking this medicine or for 5 months after stopping   it. Women should inform their doctor if they wish to become pregnant or think they might be pregnant. There is a potential for serious side effects to an unborn child. Talk to your health care professional or pharmacist for more  information. What side effects may I notice from receiving this medicine? Side effects that you should report to your doctor or health care professional as soon as possible: -allergic reactions like skin rash, itching or hives, swelling of the face, lips, or tongue -breathing problems -chest pain -fast, irregular heartbeat -feeling faint or lightheaded, falls -fever, chills, or any other sign of infection -muscle spasms, tightening, or twitches -numbness or tingling -skin blisters or bumps, or is dry, peels, or red -slow healing or unexplained pain in the mouth or jaw -unusual bleeding or bruising Side effects that usually do not require medical attention (Report these to your doctor or health care professional if they continue or are bothersome.): -muscle pain -stomach upset, gas This list may not describe all possible side effects. Call your doctor for medical advice about side effects. You may report side effects to FDA at 1-800-FDA-1088. Where should I keep my medicine? This medicine is only given in a clinic, doctor's office, or other health care setting and will not be stored at home. NOTE: This sheet is a summary. It may not cover all possible information. If you have questions about this medicine, talk to your doctor, pharmacist, or health care provider.  2015, [employer]vier/Gold Standard. (2011-12-08 12:37:47)  

## 2014-05-12 NOTE — Progress Notes (Signed)
Hematology and Oncology Follow Up Visit  [name] Mathews [id number] Feb 20, 1946 68 y.o. 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 He was inially diagnosed  2001, gleason score 4+5= 9.  Prior Therapy: 1. 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 He was started on hormone therapy, initially with Lupron.  2. Due to a rise in his PSA, Casodex was added to firmagon in 2009.  3. In 08/3011, his PSA was up to 16, with castrate level testosterone and bone involvement based on a bone scan on 08/2011.  Current therapy:  Zytiga 1000 mg po daily started in 09/2011 for a PSA of 22. He is on Prednisone 5 mg twice a day with excellent response, PSA dropping initially from 33 to 2.34. Xgeva 120 mg started on 10/31/2011. Repeated every visit.  Lupron given by Dr. Janice Norrie.   Interim History: [name] presents today for a follow up visit by himself. Since the last visit, he reports no new complaints. He continues to be on Zytiga and tolerating this well without complications. He is reporting occasional right-sided hip pain which has been less of an issue since the last visit. He reports this less frequent with less intensity.He does not report any neurological deficits or lower extremity weakness. He denied any trauma. He reports no frequency urgency or hematuria. He reports no abdominal pain, nausea, or vomiting. He has no fluid retention or leg swelling. He denied any shoulder pain, has not had any major changes in his performance status. He has continued to have excellent quality of life. He denies any fevers chills or sweats has not reported any weight loss or appetite changes.  Has not reported any hematochezia or melena.  He still able to perform work-related duties intermittently.  Rest of his review of systems unremarkable.   Medications: I have reviewed the patient's current medications. Current Outpatient Prescriptions  Medication Sig Dispense Refill  . abiraterone Acetate (ZYTIGA) 250 MG tablet Take 4 tablets (1,000 mg total) by mouth daily. Take on an empty stomach 1 hour before or 2 hours after a meal 120 tablet 0  . atorvastatin (LIPITOR) 10 MG tablet Take 10 mg by mouth daily.    . Calcium Carbonate (CALTRATE 600 PO) Take 600 mg by mouth 3 (three) times daily.    [name] denosumab (XGEVA) 120 MG/1.7ML SOLN Inject 120 mg into the skin every 30 (thirty) days.    . DULoxetine (CYMBALTA) 30 MG capsule Take 30 mg by mouth daily.    [name] EPIPEN 2-PAK 0.3 MG/0.3ML SOAJ injection     . fish oil-omega-3 fatty acids 1000 MG capsule Take 1 g by mouth daily.    [name] HYDROcodone-acetaminophen (VICODIN) 5-325 mg TABS tablet Take one tablet by mouth every 6 hours as needed. 30 tablet 0  . predniSONE (DELTASONE) 5 MG tablet TAKE 1 TABLET (5 MG TOTAL) BY MOUTH 2 (TWO) TIMES DAILY. 60 tablet 1  . rizatriptan (MAXALT-MLT) 10 MG disintegrating tablet Take 10 mg by mouth as needed.     [name] rOPINIRole (REQUIP) 2 MG tablet Take 2 mg by mouth 2 (two) times daily.    . solifenacin (VESICARE) 5 MG tablet Take 10 mg by mouth daily.    . vitamin B-12 (CYANOCOBALAMIN) 1000 MCG tablet Take 1,000 mcg by mouth daily.  Current Facility-Administered Medications  Medication Dose Route Frequency Provider Last Rate Last Dose  . denosumab (XGEVA) injection 120 mg  120 mg Subcutaneous Once Maryanna Shape, NP         Allergies:  Allergies  Allergen Reactions  . Percocet [Oxycodone-Acetaminophen] Palpitations    His past medical history was reviewed today and is unchanged.    Physical Exam: Blood pressure 158/76, pulse 70, temperature 98.7 F (37.1 C), temperature source Oral, resp. rate 20, height 5\' 1"  (1.549 m), weight 181 lb 1.6 oz (82.146 kg), SpO2 99 %.  ECOG: 0  General appearance:  alert not in any distress. Head: Normocephalic, without obvious abnormality Neck: no adenopathy Lymph nodes: Cervical, supraclavicular, and axillary nodes normal. Heart:regular rate and rhythm, S1, S2 normal, no murmur, click, rub or gallop Lung:chest clear, no wheezing, rales, normal symmetric air entry Abdomen: soft, non-tender, without masses or organomegaly Back examination: No skin rashes or lesions. No point tenderness noted in his right flank. EXT:no erythema, induration, or nodules Skin examination: No rashes or lesions. No ecchymosis or petechiae appear Neurological examination: No motor or sensory deficits.   Lab Results: Lab Results  Component Value Date   WBC 10.3 05/12/2014   HGB 12.8* 05/12/2014   HCT 40.7 05/12/2014   MCV 83.6 05/12/2014   PLT 242 05/12/2014     Chemistry      Component Value Date/Time   NA 143 04/14/2014 1516   NA 143 01/14/2012 0935   K 3.9 04/14/2014 1516   K 4.4 01/14/2012 0935   CL 105 12/08/2012 1504   CL 107 01/14/2012 0935   CO2 29 04/14/2014 1516   CO2 29 01/14/2012 0935   BUN 9.2 04/14/2014 1516   BUN 11 01/14/2012 0935   CREATININE 0.8 04/14/2014 1516   CREATININE 0.83 01/14/2012 0935      Component Value Date/Time   CALCIUM 9.5 04/14/2014 1516   CALCIUM 9.5 01/14/2012 0935   ALKPHOS 76 04/14/2014 1516   ALKPHOS 87 01/14/2012 0935   AST 18 04/14/2014 1516   AST 18 01/14/2012 0935   ALT 12 04/14/2014 1516   ALT 14 01/14/2012 0935   BILITOT 0.49 04/14/2014 1516   BILITOT 0.7 01/14/2012 0935      Results for [name] Mathews (MRN [id number]) as of 05/12/2014 15:33  Ref. Range 02/03/2014 08:35 03/07/2014 09:38 04/14/2014 15:15  PSA Latest Range: <=4.00 ng/mL 6.59 (H) 4.88 (H) 6.75 (H)    Impression and Plan: This is a pleasant 68 year old gentleman with the following issues:  1. 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 He has an elevated PSA up to 33 in May of  2013. PSA dropped to 2.40. PSA up slightly to 6.76. Bone scan obtained on 03/07/2014 showed that his disease continued to be under excellent control with the help of Zytiga. The plan is to continue on the current dose and schedule. We will use a different agent if he has progression of disease based on imaging studies or rapid rise in his PSA.  2. Bone directed therapy. On Xgeva with calcium supplementation. Dental hygiene encouraged. Continue to educate him about complications such as osteonecrosis of the jaw. None reported at this time.  3. Androgen deprivation: He is to continue Lupron under the care of Dr. Janice Norrie.   4. Lower back pain/ hip pain: Bone scan did not show cancer in that area. 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  5. Followup: in  1 month.      Zola Button, MD 11/20/20154:04 PM

## 2014-05-12 NOTE — Telephone Encounter (Signed)
Gave avs & cal for Dec. °

## 2014-05-13 LAB — PSA: PSA: 7.02 ng/mL — ABNORMAL HIGH (ref <=4.00)

## 2014-06-01 ENCOUNTER — Other Ambulatory Visit: Payer: Self-pay

## 2014-06-01 NOTE — Telephone Encounter (Signed)
Faxed zytiga refill to [facility]

## 2014-06-02 NOTE — Telephone Encounter (Signed)
RECEIVED A FAX FROM [facility] CONCERNING A CONFIRMATION OF FACSIMILE RECEIPT FOR PT. REFERRAL. 

## 2014-06-09 ENCOUNTER — Telehealth: Payer: Self-pay | Admitting: Physician Assistant

## 2014-06-09 ENCOUNTER — Encounter: Payer: Self-pay | Admitting: Physician Assistant

## 2014-06-09 ENCOUNTER — Ambulatory Visit (HOSPITAL_BASED_OUTPATIENT_CLINIC_OR_DEPARTMENT_OTHER): Payer: Medicare Other

## 2014-06-09 ENCOUNTER — Other Ambulatory Visit (HOSPITAL_BASED_OUTPATIENT_CLINIC_OR_DEPARTMENT_OTHER): Payer: Medicare Other

## 2014-06-09 ENCOUNTER — Ambulatory Visit (HOSPITAL_BASED_OUTPATIENT_CLINIC_OR_DEPARTMENT_OTHER): Payer: Medicare Other | Admitting: Physician Assistant

## 2014-06-09 VITALS — BP 148/75 | HR 76 | Temp 98.3°F | Resp 18 | Ht 61.0 in | Wt 179.6 lb

## 2014-06-09 DIAGNOSIS — C61 Malignant neoplasm of prostate: Secondary | ICD-10-CM

## 2014-06-09 DIAGNOSIS — C7951 Secondary malignant neoplasm of bone: Secondary | ICD-10-CM | POA: Diagnosis not present

## 2014-06-09 DIAGNOSIS — M25559 Pain in unspecified hip: Secondary | ICD-10-CM

## 2014-06-09 DIAGNOSIS — M545 Low back pain: Secondary | ICD-10-CM

## 2014-06-09 LAB — COMPREHENSIVE METABOLIC PANEL (CC13)
ALBUMIN: 3.3 g/dL — AB (ref 3.5–5.0)
ALT: 13 U/L (ref 0–55)
ANION GAP: 10 meq/L (ref 3–11)
AST: 17 U/L (ref 5–34)
Alkaline Phosphatase: 88 U/L (ref 40–150)
BUN: 11 mg/dL (ref 7.0–26.0)
CHLORIDE: 107 meq/L (ref 98–109)
CO2: 30 mEq/L — ABNORMAL HIGH (ref 22–29)
Calcium: 9.5 mg/dL (ref 8.4–10.4)
Creatinine: 0.8 mg/dL (ref 0.7–1.3)
EGFR: >90 mL/min/{1.73_m2} (ref >90)
Glucose: 85 mg/dl (ref 70–140)
POTASSIUM: 3.8 meq/L (ref 3.5–5.1)
SODIUM: 147 meq/L — AB (ref 136–145)
Total Bilirubin: 0.26 mg/dL (ref 0.20–1.20)
Total Protein: 6.7 g/dL (ref 6.4–8.3)

## 2014-06-09 LAB — CBC WITH DIFFERENTIAL/PLATELET
BASO%: 0.3 % (ref 0.0–2.0)
Basophils Absolute: 0 10*3/uL (ref 0.0–0.1)
EOS%: 0.9 % (ref 0.0–7.0)
Eosinophils Absolute: 0.1 10*3/uL (ref 0.0–0.5)
HEMATOCRIT: 39.4 % (ref 38.4–49.9)
HGB: 12.2 g/dL — ABNORMAL LOW (ref 13.0–17.1)
LYMPH#: 3.1 10*3/uL (ref 0.9–3.3)
LYMPH%: 25.4 % (ref 14.0–49.0)
MCH: 25.7 pg — ABNORMAL LOW (ref 27.2–33.4)
MCHC: 31 g/dL — AB (ref 32.0–36.0)
MCV: 82.9 fL (ref 79.3–98.0)
MONO#: 0.9 10*3/uL (ref 0.1–0.9)
MONO%: 7 % (ref 0.0–14.0)
NEUT#: 8.2 10*3/uL — ABNORMAL HIGH (ref 1.5–6.5)
NEUT%: 66.4 % (ref 39.0–75.0)
Platelets: 226 10*3/uL (ref 140–400)
RBC: 4.75 10*6/uL (ref 4.20–5.82)
RDW: 15.4 % — AB (ref 11.0–14.6)
WBC: 12.3 10*3/uL — AB (ref 4.0–10.3)

## 2014-06-09 MED ORDER — DENOSUMAB 120 MG/1.7ML ~~LOC~~ SOLN
120.0000 mg | Freq: Once | SUBCUTANEOUS | Status: AC
  Administered 2014-06-09: 120 mg via SUBCUTANEOUS
  Filled 2014-06-09: qty 1.7

## 2014-06-09 MED ORDER — PREDNISONE 5 MG PO TABS: ORAL_TABLET | ORAL | Status: DC

## 2014-06-09 MED ORDER — HYDROCODONE-ACETAMINOPHEN 5-325MG PREPACK (~~LOC~~: ORAL_TABLET | ORAL | Status: DC

## 2014-06-09 NOTE — Patient Instructions (Signed)
Continue Zytiga 1000 mg by mouth daily and Prednisone 5 mg by mouth twice daily with food Take your hydrocodone tablets as prescribed for pain Follow up in 1 month

## 2014-06-09 NOTE — Progress Notes (Signed)
Hematology and Oncology Follow Up Visit  [name] Mathews [id number] October 08, 1945 68 y.o. 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 He was inially diagnosed  2001, gleason score 4+5= 9.  Prior Therapy: 1. 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 He was started on hormone therapy, initially with Lupron.  2. Due to a rise in his PSA, Casodex was added to firmagon in 2009.  3. In 08/3011, his PSA was up to 16, with castrate level testosterone and bone involvement based on a bone scan on 08/2011.  Current therapy:  Zytiga 1000 mg po daily started in 09/2011 for a PSA of 22. He is on Prednisone 5 mg twice a day with excellent response, PSA dropping initially from 33 to 2.34. Xgeva 120 mg started on 10/31/2011. Repeated every visit.  Lupron given by Dr. Janice Norrie.   Interim History: [name] presents today for a follow up visit unaccompanied. Since the last visit, he reports no new complaints except for some mild to moderate back pain. He continues to be on Zytiga and tolerating this well without complications. He is reporting occasional right-sided hip pain which has been less of an issue since the last visit. He reports this less frequent with less intensity.He does not report any neurological deficits or lower extremity weakness. He denied any trauma. He reports no frequency urgency or hematuria. He reports no abdominal pain, nausea, or vomiting. He has no fluid retention or leg swelling. He denied any shoulder pain, has not had any major changes in his performance status. He has continued to have excellent quality of life. He denies any fevers chills or sweats has not reported any weight loss or appetite changes.  Has not reported any hematochezia or melena.  He still able  to perform work-related duties intermittently. Remainder of his review of systems unremarkable. He requests a refill for his prednisone as well as his hydrocodone tablets.   Medications: I have reviewed the patient's current medications. Current Outpatient Prescriptions  Medication Sig Dispense Refill  . abiraterone Acetate (ZYTIGA) 250 MG tablet Take 4 tablets (1,000 mg total) by mouth daily. Take on an empty stomach 1 hour before or 2 hours after a meal 120 tablet 0  . atorvastatin (LIPITOR) 10 MG tablet Take 10 mg by mouth daily.    . Calcium Carbonate (CALTRATE 600 PO) Take 600 mg by mouth 3 (three) times daily.    [name] denosumab (XGEVA) 120 MG/1.7ML SOLN Inject 120 mg into the skin every 30 (thirty) days.    . DULoxetine (CYMBALTA) 30 MG capsule Take 30 mg by mouth daily.    . fish oil-omega-3 fatty acids 1000 MG capsule Take 1 g by mouth daily.    [name] HYDROcodone-acetaminophen (VICODIN) 5-325 mg TABS tablet Take one tablet by mouth every 6 hours as needed. 30 tablet 0  . predniSONE (DELTASONE) 5 MG tablet TAKE 1 TABLET (5 MG TOTAL) BY MOUTH 2 (TWO) TIMES DAILY. 60 tablet 1  . rizatriptan (MAXALT-MLT) 10 MG disintegrating tablet Take 10 mg by mouth as needed.     [name] rOPINIRole (REQUIP) 2 MG tablet Take 2 mg by mouth 2 (two) times daily.    . solifenacin (VESICARE) 5 MG tablet Take 10 mg by mouth daily.    . vitamin B-12 (CYANOCOBALAMIN) 1000  MCG tablet Take 1,000 mcg by mouth daily.    [name] EPIPEN 2-PAK 0.3 MG/0.3ML SOAJ injection      No current facility-administered medications for this visit.     Allergies:  Allergies  Allergen Reactions  . Percocet [Oxycodone-Acetaminophen] Palpitations    His past medical history was reviewed today and is unchanged.    Physical Exam: Blood pressure 148/75, pulse 76, temperature 98.3 F (36.8 C), temperature source Oral, resp. rate 18, height 5\' 1"  (1.549 m), weight 179 lb 9.6 oz (81.466 kg), SpO2 99 %.  ECOG: 0  General appearance: alert not  in any distress. Head: Normocephalic, without obvious abnormality Neck: no adenopathy Lymph nodes: Cervical, supraclavicular, and axillary nodes normal. Heart:regular rate and rhythm, S1, S2 normal, no murmur, click, rub or gallop Lung:chest clear, no wheezing, rales, normal symmetric air entry Abdomen: soft, non-tender, without masses or organomegaly Back examination: No skin rashes or lesions. No point tenderness noted in his right flank.or along his vertebral bodies EXT:no erythema, induration, or nodules Skin examination: No rashes or lesions. No ecchymosis or petechiae appear Neurological examination: No motor or sensory deficits.   Lab Results: Lab Results  Component Value Date   WBC 12.3* 06/09/2014   HGB 12.2* 06/09/2014   HCT 39.4 06/09/2014   MCV 82.9 06/09/2014   PLT 226 06/09/2014     Chemistry      Component Value Date/Time   NA 147* 06/09/2014 1400   NA 143 01/14/2012 0935   K 3.8 06/09/2014 1400   K 4.4 01/14/2012 0935   CL 105 12/08/2012 1504   CL 107 01/14/2012 0935   CO2 30* 06/09/2014 1400   CO2 29 01/14/2012 0935   BUN 11.0 06/09/2014 1400   BUN 11 01/14/2012 0935   CREATININE 0.8 06/09/2014 1400   CREATININE 0.83 01/14/2012 0935      Component Value Date/Time   CALCIUM 9.5 06/09/2014 1400   CALCIUM 9.5 01/14/2012 0935   ALKPHOS 88 06/09/2014 1400   ALKPHOS 87 01/14/2012 0935   AST 17 06/09/2014 1400   AST 18 01/14/2012 0935   ALT 13 06/09/2014 1400   ALT 14 01/14/2012 0935   BILITOT 0.26 06/09/2014 1400   BILITOT 0.7 01/14/2012 0935      Results for [name], Mathews (MRN [id number]) as of 05/12/2014 15:33  Ref. Range 02/03/2014 08:35 03/07/2014 09:38 04/14/2014 15:15  PSA Latest Range: <=4.00 ng/mL 6.59 (H) 4.88 (H) 6.75 (H)    Impression and Plan: This is a pleasant 68 year old gentleman with the following issues:  1. 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 He has an elevated  PSA up to 33 in May of 2013. PSA dropped to 2.40. PSA up slightly to 6.76. Bone scan obtained on 03/07/2014 showed that his disease continued to be under excellent control with the help of Zytiga. The plan is to continue on the current dose and schedule. We will use a different agent if he has progression of disease based on imaging studies or rapid rise in his PSA. Refill for his prednisone was sent to his pharmacy of record via E scribed  2. Bone directed therapy. On Xgeva with calcium supplementation. Dental hygiene encouraged. Continue to educate him about complications such as osteonecrosis of the jaw. None reported at this time.  3. Androgen deprivation: He is to continue Lupron under the care of Dr. Janice Norrie.   4. Lower back pain/ hip pain: Bone scan did not show cancer in  that area. 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 Patient was given a refill prescription for his hydrocodone tablets a total 30 with no refill  5. Followup: in 1 month.      Carlton Adam, PA-C  12/18/20154:02 PM

## 2014-06-09 NOTE — Patient Instructions (Signed)
Denosumab injection What is this medicine? DENOSUMAB (den oh sue mab) slows bone breakdown. Prolia is used to treat osteoporosis in women after menopause and in men. Xgeva is used to prevent bone fractures and other bone problems caused by cancer bone metastases. Xgeva is also used to treat giant cell tumor of the bone. This medicine may be used for other purposes; ask your health care provider or pharmacist if you have questions. COMMON BRAND NAME(S): Prolia, XGEVA What should I tell my health care provider before I take this medicine? They need to know if you have any of these conditions: -dental disease -eczema -infection or history of infections -kidney disease or on dialysis -low blood calcium or vitamin D -malabsorption syndrome -scheduled to have surgery or tooth extraction -taking medicine that contains denosumab -thyroid or parathyroid disease -an unusual reaction to denosumab, other medicines, foods, dyes, or preservatives -pregnant or trying to get pregnant -breast-feeding How should I use this medicine? This medicine is for injection under the skin. It is given by a health care professional in a hospital or clinic setting. If you are getting Prolia, a special MedGuide will be given to you by the pharmacist with each prescription and refill. Be sure to read this information carefully each time. For Prolia, talk to your pediatrician regarding the use of this medicine in children. Special care may be needed. For Xgeva, talk to your pediatrician regarding the use of this medicine in children. While this drug may be prescribed for children as young as 13 years for selected conditions, precautions do apply. Overdosage: If you think you've taken too much of this medicine contact a poison control center or emergency room at once. Overdosage: If you think you have taken too much of this medicine contact a poison control center or emergency room at once. NOTE: This medicine is only for  you. Do not share this medicine with others. What if I miss a dose? It is important not to miss your dose. Call your doctor or health care professional if you are unable to keep an appointment. What may interact with this medicine? Do not take this medicine with any of the following medications: -other medicines containing denosumab This medicine may also interact with the following medications: -medicines that suppress the immune system -medicines that treat cancer -steroid medicines like prednisone or cortisone This list may not describe all possible interactions. Give your health care provider a list of all the medicines, herbs, non-prescription drugs, or dietary supplements you use. Also tell them if you smoke, drink alcohol, or use illegal drugs. Some items may interact with your medicine. What should I watch for while using this medicine? Visit your doctor or health care professional for regular checks on your progress. Your doctor or health care professional may order blood tests and other tests to see how you are doing. Call your doctor or health care professional if you get a cold or other infection while receiving this medicine. Do not treat yourself. This medicine may decrease your body's ability to fight infection. You should make sure you get enough calcium and vitamin D while you are taking this medicine, unless your doctor tells you not to. Discuss the foods you eat and the vitamins you take with your health care professional. See your dentist regularly. Brush and floss your teeth as directed. Before you have any dental work done, tell your dentist you are receiving this medicine. Do not become pregnant while taking this medicine or for 5 months after stopping   it. Women should inform their doctor if they wish to become pregnant or think they might be pregnant. There is a potential for serious side effects to an unborn child. Talk to your health care professional or pharmacist for more  information. What side effects may I notice from receiving this medicine? Side effects that you should report to your doctor or health care professional as soon as possible: -allergic reactions like skin rash, itching or hives, swelling of the face, lips, or tongue -breathing problems -chest pain -fast, irregular heartbeat -feeling faint or lightheaded, falls -fever, chills, or any other sign of infection -muscle spasms, tightening, or twitches -numbness or tingling -skin blisters or bumps, or is dry, peels, or red -slow healing or unexplained pain in the mouth or jaw -unusual bleeding or bruising Side effects that usually do not require medical attention (Report these to your doctor or health care professional if they continue or are bothersome.): -muscle pain -stomach upset, gas This list may not describe all possible side effects. Call your doctor for medical advice about side effects. You may report side effects to FDA at 1-800-FDA-1088. Where should I keep my medicine? This medicine is only given in a clinic, doctor's office, or other health care setting and will not be stored at home. NOTE: This sheet is a summary. It may not cover all possible information. If you have questions about this medicine, talk to your doctor, pharmacist, or health care provider.  2015, [employer]vier/Gold Standard. (2011-12-08 12:37:47)  

## 2014-06-09 NOTE — Telephone Encounter (Signed)
Gave avs & cal for Jan. °

## 2014-06-10 LAB — PSA: PSA: 10.72 ng/mL — ABNORMAL HIGH (ref <=4.00)

## 2014-06-19 DIAGNOSIS — C61 Malignant neoplasm of prostate: Secondary | ICD-10-CM | POA: Diagnosis not present

## 2014-06-20 DIAGNOSIS — M5415 Radiculopathy, thoracolumbar region: Secondary | ICD-10-CM | POA: Diagnosis not present

## 2014-06-20 DIAGNOSIS — G210 Malignant neuroleptic syndrome: Secondary | ICD-10-CM | POA: Diagnosis not present

## 2014-06-20 DIAGNOSIS — G609 Hereditary and idiopathic neuropathy, unspecified: Secondary | ICD-10-CM | POA: Diagnosis not present

## 2014-06-20 DIAGNOSIS — G2581 Restless legs syndrome: Secondary | ICD-10-CM | POA: Diagnosis not present

## 2014-07-07 ENCOUNTER — Other Ambulatory Visit (HOSPITAL_BASED_OUTPATIENT_CLINIC_OR_DEPARTMENT_OTHER): Payer: Medicare Other

## 2014-07-07 ENCOUNTER — Telehealth: Payer: Self-pay | Admitting: Physician Assistant

## 2014-07-07 ENCOUNTER — Encounter: Payer: Self-pay | Admitting: Physician Assistant

## 2014-07-07 ENCOUNTER — Ambulatory Visit (HOSPITAL_BASED_OUTPATIENT_CLINIC_OR_DEPARTMENT_OTHER): Payer: Medicare Other | Admitting: Physician Assistant

## 2014-07-07 ENCOUNTER — Ambulatory Visit (HOSPITAL_BASED_OUTPATIENT_CLINIC_OR_DEPARTMENT_OTHER): Payer: Medicare Other

## 2014-07-07 VITALS — BP 149/77 | HR 90 | Temp 98.4°F | Resp 18 | Ht 61.0 in | Wt 180.3 lb

## 2014-07-07 DIAGNOSIS — M545 Low back pain: Secondary | ICD-10-CM | POA: Diagnosis not present

## 2014-07-07 DIAGNOSIS — M25559 Pain in unspecified hip: Secondary | ICD-10-CM | POA: Diagnosis not present

## 2014-07-07 DIAGNOSIS — C61 Malignant neoplasm of prostate: Secondary | ICD-10-CM

## 2014-07-07 DIAGNOSIS — C7951 Secondary malignant neoplasm of bone: Secondary | ICD-10-CM

## 2014-07-07 LAB — COMPREHENSIVE METABOLIC PANEL (CC13)
ALT: 12 U/L (ref 0–55)
ANION GAP: 10 meq/L (ref 3–11)
AST: 18 U/L (ref 5–34)
Albumin: 3.4 g/dL — ABNORMAL LOW (ref 3.5–5.0)
Alkaline Phosphatase: 84 U/L (ref 40–150)
BUN: 12.6 mg/dL (ref 7.0–26.0)
CHLORIDE: 105 meq/L (ref 98–109)
CO2: 29 mEq/L (ref 22–29)
Calcium: 9.3 mg/dL (ref 8.4–10.4)
Creatinine: 0.8 mg/dL (ref 0.7–1.3)
EGFR: >90 mL/min/{1.73_m2} (ref >90)
Glucose: 94 mg/dl (ref 70–140)
Potassium: 4.5 mEq/L (ref 3.5–5.1)
SODIUM: 143 meq/L (ref 136–145)
TOTAL PROTEIN: 6.8 g/dL (ref 6.4–8.3)
Total Bilirubin: 0.53 mg/dL (ref 0.20–1.20)

## 2014-07-07 LAB — CBC WITH DIFFERENTIAL/PLATELET
BASO%: 0.2 % (ref 0.0–2.0)
Basophils Absolute: 0 10*3/uL (ref 0.0–0.1)
EOS%: 0.4 % (ref 0.0–7.0)
Eosinophils Absolute: 0 10*3/uL (ref 0.0–0.5)
HEMATOCRIT: 37.7 % — AB (ref 38.4–49.9)
HEMOGLOBIN: 12 g/dL — AB (ref 13.0–17.1)
LYMPH%: 17.4 % (ref 14.0–49.0)
MCH: 26.8 pg — ABNORMAL LOW (ref 27.2–33.4)
MCHC: 31.8 g/dL — AB (ref 32.0–36.0)
MCV: 84.2 fL (ref 79.3–98.0)
MONO#: 0.4 10*3/uL (ref 0.1–0.9)
MONO%: 4.2 % (ref 0.0–14.0)
NEUT#: 8.1 10*3/uL — ABNORMAL HIGH (ref 1.5–6.5)
NEUT%: 77.8 % — ABNORMAL HIGH (ref 39.0–75.0)
Platelets: 216 10*3/uL (ref 140–400)
RBC: 4.48 10*6/uL (ref 4.20–5.82)
RDW: 14.9 % — AB (ref 11.0–14.6)
WBC: 10.4 10*3/uL — ABNORMAL HIGH (ref 4.0–10.3)
lymph#: 1.8 10*3/uL (ref 0.9–3.3)

## 2014-07-07 MED ORDER — DENOSUMAB 120 MG/1.7ML ~~LOC~~ SOLN
120.0000 mg | Freq: Once | SUBCUTANEOUS | Status: AC
  Administered 2014-07-07: 120 mg via SUBCUTANEOUS
  Filled 2014-07-07: qty 1.7

## 2014-07-07 NOTE — Progress Notes (Signed)
Hematology and Oncology Follow Up Visit  [name] Mathews [id number] Feb 25, 1946 69 y.o. 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 He was inially diagnosed  2001, gleason score 4+5= 9.  Prior Therapy: 1. 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 He was started on hormone therapy, initially with Lupron.  2. Due to a rise in his PSA, Casodex was added to firmagon in 2009.  3. In 08/3011, his PSA was up to 16, with castrate level testosterone and bone involvement based on a bone scan on 08/2011.  Current therapy:  Zytiga 1000 mg po daily started in 09/2011 for a PSA of 22. He is on Prednisone 5 mg twice a day with excellent response, PSA dropping initially from 33 to 2.34. Xgeva 120 mg started on 10/31/2011. Repeated every visit.  Lupron given by Dr. Janice Norrie.   Interim History: [name] presents today for a follow up visit unaccompanied. Since the last visit, he reports he states any developed a "bad cold" over the [employer] holiday. He otherwise reports no new complaints except for some mild to moderate back pain. He continues to be on Zytiga and tolerating this well without complications. He is reporting occasional right-sided hip pain which continues to be less of an issue since the last visit. He does not report any neurological deficits or lower extremity weakness. He denied any trauma. He reports no frequency urgency or hematuria. He reports no abdominal pain, nausea, or vomiting. He has no fluid retention or leg swelling. He denied any shoulder pain, has not had any major changes in his performance status. He has continued to have excellent quality of life. He denies any fevers chills or sweats has not reported any weight loss or appetite changes.  Has not reported  any hematochezia or melena.  He still able to perform work-related duties intermittently. He reports that he was seen by Dr. Janice Norrie last week and received his Lupron injection. He will be seen by Dr. Janice Norrie again in 4 months when he is due for his next Lupron injection. Remainder of his review of systems unremarkable.    Medications: I have reviewed the patient's current medications. Current Outpatient Prescriptions  Medication Sig Dispense Refill  . abiraterone Acetate (ZYTIGA) 250 MG tablet Take 4 tablets (1,000 mg total) by mouth daily. Take on an empty stomach 1 hour before or 2 hours after a meal 120 tablet 0  . atorvastatin (LIPITOR) 10 MG tablet Take 10 mg by mouth daily.    . Calcium Carbonate (CALTRATE 600 PO) Take 600 mg by mouth 3 (three) times daily.    [name] denosumab (XGEVA) 120 MG/1.7ML SOLN Inject 120 mg into the skin every 30 (thirty) days.    . DULoxetine (CYMBALTA) 30 MG capsule Take 30 mg by mouth daily.    [name] EPIPEN 2-PAK 0.3 MG/0.3ML SOAJ injection     . fish oil-omega-3 fatty acids 1000 MG capsule Take 1 g by mouth daily.    [name] HYDROcodone-acetaminophen (VICODIN) 5-325 mg TABS tablet Take one tablet by mouth every 6 hours as needed. 30 tablet 0  . mirabegron ER (MYRBETRIQ) 50 MG TB24 tablet Take 50 mg by mouth daily.    . predniSONE (DELTASONE) 5 MG tablet TAKE 1 TABLET (5 MG TOTAL) BY MOUTH 2 (TWO) TIMES  DAILY. 60 tablet 1  . rizatriptan (MAXALT-MLT) 10 MG disintegrating tablet Take 10 mg by mouth as needed.     [name] rOPINIRole (REQUIP) 2 MG tablet Take 2 mg by mouth 2 (two) times daily.    . solifenacin (VESICARE) 5 MG tablet Take 10 mg by mouth daily.    . vitamin B-12 (CYANOCOBALAMIN) 1000 MCG tablet Take 1,000 mcg by mouth daily.     No current facility-administered medications for this visit.     Allergies:  Allergies  Allergen Reactions  . Percocet [Oxycodone-Acetaminophen] Palpitations    His past medical history was reviewed today and is unchanged.    Physical  Exam: Blood pressure 149/77, pulse 90, temperature 98.4 F (36.9 C), temperature source Oral, resp. rate 18, height 5\' 1"  (1.549 m), weight 180 lb 4.8 oz (81.784 kg), SpO2 98 %.  ECOG: 0  General appearance: alert not in any distress. Head: Normocephalic, without obvious abnormality Neck: no adenopathy Lymph nodes: Cervical, supraclavicular, and axillary nodes normal. Heart:regular rate and rhythm, S1, S2 normal, no murmur, click, rub or gallop Lung:chest clear, no wheezing, rales, normal symmetric air entry Abdomen: soft, non-tender, without masses or organomegaly Back examination: No skin rashes or lesions. No point tenderness noted in his right flank.or along his vertebral bodies EXT:no erythema, induration, or nodules Skin examination: No rashes or lesions. No ecchymosis or petechiae appear Neurological examination: No motor or sensory deficits.   Lab Results: Lab Results  Component Value Date   WBC 10.4* 07/07/2014   HGB 12.0* 07/07/2014   HCT 37.7* 07/07/2014   MCV 84.2 07/07/2014   PLT 216 07/07/2014     Chemistry      Component Value Date/Time   NA 143 07/07/2014 1143   NA 143 01/14/2012 0935   K 4.5 07/07/2014 1143   K 4.4 01/14/2012 0935   CL 105 12/08/2012 1504   CL 107 01/14/2012 0935   CO2 29 07/07/2014 1143   CO2 29 01/14/2012 0935   BUN 12.6 07/07/2014 1143   BUN 11 01/14/2012 0935   CREATININE 0.8 07/07/2014 1143   CREATININE 0.83 01/14/2012 0935      Component Value Date/Time   CALCIUM 9.3 07/07/2014 1143   CALCIUM 9.5 01/14/2012 0935   ALKPHOS 84 07/07/2014 1143   ALKPHOS 87 01/14/2012 0935   AST 18 07/07/2014 1143   AST 18 01/14/2012 0935   ALT 12 07/07/2014 1143   ALT 14 01/14/2012 0935   BILITOT 0.53 07/07/2014 1143   BILITOT 0.7 01/14/2012 0935      Results for DANTA, BAUMGARDNER (MRN [id number]) as of 05/12/2014 15:33  Ref. Range 02/03/2014 08:35 03/07/2014 09:38 04/14/2014 15:15  PSA Latest Range: <=4.00 ng/mL 6.59 (H) 4.88 (H) 6.75 (H)     Impression and Plan: This is a pleasant 69 year old gentleman with the following issues:  1. 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 He has an elevated PSA up to 33 in May of 2013. PSA dropped to 2.40. PSA is again increased from the 6.76 to 10.72.[name]  Bone scan obtained on 03/07/2014 showed that his disease continued to be under excellent control with the help of Zytiga. The plan is to continue on the current dose and schedule. We will use a different agent if he has progression of disease based on imaging studies or rapid rise in his PSA.   2. Bone directed therapy. On Xgeva with calcium supplementation. Dental hygiene encouraged. Continue to educate him about  complications such as osteonecrosis of the jaw. None reported at this time.  3. Androgen deprivation: He is to continue Lupron under the care of Dr. Janice Norrie.   4. Lower back pain/ hip pain: Bone scan did not show cancer in that area. 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 Patient was given a refill prescription for his hydrocodone tablets a total 30 with no refill  5. Followup: in 1 month.     Carlton Adam, PA-C  1/15/20163:04 PM

## 2014-07-07 NOTE — Patient Instructions (Signed)
Continue Zytiga and Prednisone at the current doses Follow up in one month

## 2014-07-07 NOTE — Telephone Encounter (Signed)
Gave avs & cal for Feb. °

## 2014-07-08 LAB — PSA: PSA: 10.27 ng/mL — ABNORMAL HIGH (ref <=4.00)

## 2014-07-26 ENCOUNTER — Other Ambulatory Visit: Payer: Self-pay | Admitting: *Deleted

## 2014-07-26 DIAGNOSIS — C61 Malignant neoplasm of prostate: Secondary | ICD-10-CM

## 2014-07-26 MED ORDER — ABIRATERONE ACETATE 250 MG PO TABS: 1000.0000 mg | ORAL_TABLET | Freq: Every day | ORAL | Status: DC

## 2014-08-01 DIAGNOSIS — E785 Hyperlipidemia, unspecified: Secondary | ICD-10-CM | POA: Diagnosis not present

## 2014-08-01 DIAGNOSIS — G2581 Restless legs syndrome: Secondary | ICD-10-CM | POA: Diagnosis not present

## 2014-08-01 DIAGNOSIS — N3281 Overactive bladder: Secondary | ICD-10-CM | POA: Diagnosis not present

## 2014-08-01 DIAGNOSIS — E538 Deficiency of other specified B group vitamins: Secondary | ICD-10-CM | POA: Diagnosis not present

## 2014-08-01 DIAGNOSIS — M792 Neuralgia and neuritis, unspecified: Secondary | ICD-10-CM | POA: Diagnosis not present

## 2014-08-01 DIAGNOSIS — C61 Malignant neoplasm of prostate: Secondary | ICD-10-CM | POA: Diagnosis not present

## 2014-08-04 ENCOUNTER — Ambulatory Visit (HOSPITAL_BASED_OUTPATIENT_CLINIC_OR_DEPARTMENT_OTHER): Payer: Medicare Other | Admitting: Oncology

## 2014-08-04 ENCOUNTER — Ambulatory Visit (HOSPITAL_BASED_OUTPATIENT_CLINIC_OR_DEPARTMENT_OTHER): Payer: Medicare Other

## 2014-08-04 ENCOUNTER — Other Ambulatory Visit (HOSPITAL_BASED_OUTPATIENT_CLINIC_OR_DEPARTMENT_OTHER): Payer: Medicare Other

## 2014-08-04 ENCOUNTER — Telehealth: Payer: Self-pay | Admitting: Oncology

## 2014-08-04 VITALS — BP 160/91 | HR 72 | Temp 98.0°F | Resp 18 | Ht 61.0 in | Wt 176.5 lb

## 2014-08-04 DIAGNOSIS — E291 Testicular hypofunction: Secondary | ICD-10-CM | POA: Diagnosis not present

## 2014-08-04 DIAGNOSIS — C7951 Secondary malignant neoplasm of bone: Secondary | ICD-10-CM

## 2014-08-04 DIAGNOSIS — C61 Malignant neoplasm of prostate: Secondary | ICD-10-CM | POA: Diagnosis not present

## 2014-08-04 DIAGNOSIS — M5440 Lumbago with sciatica, unspecified side: Secondary | ICD-10-CM | POA: Diagnosis not present

## 2014-08-04 LAB — CBC WITH DIFFERENTIAL/PLATELET
BASO%: 0.1 % (ref 0.0–2.0)
BASOS ABS: 0 10*3/uL (ref 0.0–0.1)
EOS ABS: 0.1 10*3/uL (ref 0.0–0.5)
EOS%: 0.6 % (ref 0.0–7.0)
HEMATOCRIT: 40.5 % (ref 38.4–49.9)
HEMOGLOBIN: 12.7 g/dL — AB (ref 13.0–17.1)
LYMPH#: 3.1 10*3/uL (ref 0.9–3.3)
LYMPH%: 25 % (ref 14.0–49.0)
MCH: 27.1 pg — ABNORMAL LOW (ref 27.2–33.4)
MCHC: 31.4 g/dL — ABNORMAL LOW (ref 32.0–36.0)
MCV: 86.4 fL (ref 79.3–98.0)
MONO#: 0.9 10*3/uL (ref 0.1–0.9)
MONO%: 7.2 % (ref 0.0–14.0)
NEUT%: 67.1 % (ref 39.0–75.0)
NEUTROS ABS: 8.3 10*3/uL — AB (ref 1.5–6.5)
Platelets: 233 10*3/uL (ref 140–400)
RBC: 4.69 10*6/uL (ref 4.20–5.82)
RDW: 14.9 % — ABNORMAL HIGH (ref 11.0–14.6)
WBC: 12.4 10*3/uL — ABNORMAL HIGH (ref 4.0–10.3)

## 2014-08-04 LAB — COMPREHENSIVE METABOLIC PANEL (CC13)
ALBUMIN: 3.3 g/dL — AB (ref 3.5–5.0)
ALT: 13 U/L (ref 0–55)
ANION GAP: 9 meq/L (ref 3–11)
AST: 17 U/L (ref 5–34)
Alkaline Phosphatase: 93 U/L (ref 40–150)
BUN: 12.6 mg/dL (ref 7.0–26.0)
CALCIUM: 9.3 mg/dL (ref 8.4–10.4)
CHLORIDE: 106 meq/L (ref 98–109)
CO2: 31 meq/L — AB (ref 22–29)
Creatinine: 0.8 mg/dL (ref 0.7–1.3)
Glucose: 90 mg/dl (ref 70–140)
POTASSIUM: 4.1 meq/L (ref 3.5–5.1)
SODIUM: 147 meq/L — AB (ref 136–145)
TOTAL PROTEIN: 6.7 g/dL (ref 6.4–8.3)
Total Bilirubin: 0.28 mg/dL (ref 0.20–1.20)

## 2014-08-04 MED ORDER — DENOSUMAB 120 MG/1.7ML ~~LOC~~ SOLN
120.0000 mg | Freq: Once | SUBCUTANEOUS | Status: AC
  Administered 2014-08-04: 120 mg via SUBCUTANEOUS
  Filled 2014-08-04: qty 1.7

## 2014-08-04 NOTE — Telephone Encounter (Signed)
gv and printed appt sched anda vs for pt for March.... °

## 2014-08-04 NOTE — Progress Notes (Signed)
Hematology and Oncology Follow Up Visit  [name] Mathews [id number] 12-12-1945 69 y.o. 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 He was inially diagnosed  2001, gleason score 4+5= 9.  Prior Therapy: 1. 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 He was started on hormone therapy, initially with Lupron.  2. Due to a rise in his PSA, Casodex was added to firmagon in 2009.  3. In 08/3011, his PSA was up to 16, with castrate level testosterone and bone involvement based on a bone scan on 08/2011.  Current therapy:  Zytiga 1000 mg po daily started in 09/2011 for a PSA of 22. He is on Prednisone 5 mg twice a day with excellent response, PSA dropping initially from 33 to 2.34. Xgeva 120 mg started on 10/31/2011. Repeated every visit.  Lupron given by Dr. Janice Norrie.   Interim History: [name] presents today for a follow up visit by himself. Since the last visit, he reports continuing to do well. He has had intermittent back pain that have completely resolved for the last 6 weeks. He does not report any new bone pain at this time. He continues to be on Zytiga and tolerating this well without complications. He does not report any neurological deficits or lower extremity weakness. He denied any trauma. He reports no frequency urgency or hematuria. He reports no abdominal pain, nausea, or vomiting. He has no fluid retention or leg swelling. He denied any shoulder pain, has not had any major changes in his performance status. He has continued to have excellent quality of life. He denies any fevers chills or sweats has not reported any weight loss or appetite changes.  Has not reported any hematochezia or melena.  He still able to perform work-related duties intermittently. Remainder of  his review of systems unremarkable.    Medications: I have reviewed the patient's current medications. Current Outpatient Prescriptions  Medication Sig Dispense Refill  . abiraterone Acetate (ZYTIGA) 250 MG tablet Take 4 tablets (1,000 mg total) by mouth daily. Take on an empty stomach 1 hour before or 2 hours after a meal 120 tablet 0  . atorvastatin (LIPITOR) 10 MG tablet Take 10 mg by mouth daily.    . Calcium Carbonate (CALTRATE 600 PO) Take 600 mg by mouth 3 (three) times daily.    [name] denosumab (XGEVA) 120 MG/1.7ML SOLN Inject 120 mg into the skin every 30 (thirty) days.    . DULoxetine (CYMBALTA) 30 MG capsule Take 30 mg by mouth daily.    . fish oil-omega-3 fatty acids 1000 MG capsule Take 1 g by mouth daily.    . mirabegron ER (MYRBETRIQ) 50 MG TB24 tablet Take 50 mg by mouth daily.    . predniSONE (DELTASONE) 5 MG tablet TAKE 1 TABLET (5 MG TOTAL) BY MOUTH 2 (TWO) TIMES DAILY. 60 tablet 1  . rizatriptan (MAXALT-MLT) 10 MG disintegrating tablet Take 10 mg by mouth as needed.     [name] rOPINIRole (REQUIP) 2 MG tablet Take 2 mg by mouth 2 (two) times daily.    . solifenacin (VESICARE) 5 MG tablet Take 10 mg by mouth daily.    . vitamin B-12 (CYANOCOBALAMIN) 1000 MCG tablet Take 1,000 mcg by mouth daily.    [name] EPIPEN 2-PAK 0.3 MG/0.3ML SOAJ injection     .  HYDROcodone-acetaminophen (VICODIN) 5-325 mg TABS tablet Take one tablet by mouth every 6 hours as needed. 30 tablet 0   No current facility-administered medications for this visit.     Allergies:  Allergies  Allergen Reactions  . Percocet [Oxycodone-Acetaminophen] Palpitations    His past medical history was reviewed today and is unchanged.    Physical Exam: Blood pressure 160/91, pulse 72, temperature 98 F (36.7 C), temperature source Oral, resp. rate 18, height 5\' 1"  (1.549 m), weight 176 lb 8 oz (80.06 kg), SpO2 98 %.  ECOG: 0  General appearance: alert not in any distress. Head: Normocephalic, without obvious  abnormality Neck: no adenopathy Lymph nodes: Cervical, supraclavicular, and axillary nodes normal. Heart:regular rate and rhythm, S1, S2 normal, no murmur, click, rub or gallop Lung:chest clear, no wheezing, rales, normal symmetric air entry Abdomen: soft, non-tender, without masses or organomegaly Back examination: No skin rashes or lesions.  EXT:no erythema, induration, or nodules Skin examination: No rashes or lesions. No ecchymosis or petechiae appear Neurological examination: No motor or sensory deficits.   Lab Results: Lab Results  Component Value Date   WBC 12.4* 08/04/2014   HGB 12.7* 08/04/2014   HCT 40.5 08/04/2014   MCV 86.4 08/04/2014   PLT 233 08/04/2014     Chemistry      Component Value Date/Time   NA 143 07/07/2014 1143   NA 143 01/14/2012 0935   K 4.5 07/07/2014 1143   K 4.4 01/14/2012 0935   CL 105 12/08/2012 1504   CL 107 01/14/2012 0935   CO2 29 07/07/2014 1143   CO2 29 01/14/2012 0935   BUN 12.6 07/07/2014 1143   BUN 11 01/14/2012 0935   CREATININE 0.8 07/07/2014 1143   CREATININE 0.83 01/14/2012 0935      Component Value Date/Time   CALCIUM 9.3 07/07/2014 1143   CALCIUM 9.5 01/14/2012 0935   ALKPHOS 84 07/07/2014 1143   ALKPHOS 87 01/14/2012 0935   AST 18 07/07/2014 1143   AST 18 01/14/2012 0935   ALT 12 07/07/2014 1143   ALT 14 01/14/2012 0935   BILITOT 0.53 07/07/2014 1143   BILITOT 0.7 01/14/2012 0935      Results for REXTON, GREULICH (MRN [id number]) as of 08/04/2014 13:23  Ref. Range 05/12/2014 15:27 06/09/2014 14:00 07/07/2014 11:43  PSA Latest Range: <=4.00 ng/mL 7.02 (H) 10.72 (H) 10.27 (H)     Impression and Plan: This is a pleasant 69 year old gentleman with the following issues:  1. 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 He has an elevated PSA up to 33 in May of 2013. PSA dropped to 2.40. PSA is again increased from the 6.76 to 10.27. It has been relatively stable  in the last few months.  Bone scan obtained on 03/07/2014 showed that his disease continued to be under excellent control with the help of Zytiga. The plan is to continue on the current dose and schedule. We will use a different agent if he has progression of disease based on imaging studies or rapid rise in his PSA.   2. Bone directed therapy. On Xgeva with calcium supplementation. Dental hygiene encouraged. Continue to educate him about complications such as osteonecrosis of the jaw. None reported at this time.  3. Androgen deprivation: He is to continue Lupron under the care of Dr. Janice Norrie.   4. Lower back pain/ hip pain: Resolved at this time without any new bone pain.  5. Followup: in 4 to 5 weeks.  [facility], MD 2/12/20161:22 PM

## 2014-08-05 LAB — PSA: PSA: 11.65 ng/mL — ABNORMAL HIGH (ref <=4.00)

## 2014-09-04 DIAGNOSIS — H10021 Other mucopurulent conjunctivitis, right eye: Secondary | ICD-10-CM | POA: Diagnosis not present

## 2014-09-04 DIAGNOSIS — T1511XA Foreign body in conjunctival sac, right eye, initial encounter: Secondary | ICD-10-CM | POA: Diagnosis not present

## 2014-09-06 ENCOUNTER — Other Ambulatory Visit: Payer: Self-pay | Admitting: *Deleted

## 2014-09-06 DIAGNOSIS — C61 Malignant neoplasm of prostate: Secondary | ICD-10-CM

## 2014-09-06 MED ORDER — ABIRATERONE ACETATE 250 MG PO TABS: 1000.0000 mg | ORAL_TABLET | Freq: Every day | ORAL | Status: DC

## 2014-09-07 DIAGNOSIS — H2513 Age-related nuclear cataract, bilateral: Secondary | ICD-10-CM | POA: Diagnosis not present

## 2014-09-07 DIAGNOSIS — H10021 Other mucopurulent conjunctivitis, right eye: Secondary | ICD-10-CM | POA: Diagnosis not present

## 2014-09-07 DIAGNOSIS — H04123 Dry eye syndrome of bilateral lacrimal glands: Secondary | ICD-10-CM | POA: Diagnosis not present

## 2014-09-08 ENCOUNTER — Telehealth: Payer: Self-pay | Admitting: Oncology

## 2014-09-08 ENCOUNTER — Ambulatory Visit (HOSPITAL_BASED_OUTPATIENT_CLINIC_OR_DEPARTMENT_OTHER): Payer: Medicare Other

## 2014-09-08 ENCOUNTER — Ambulatory Visit (HOSPITAL_BASED_OUTPATIENT_CLINIC_OR_DEPARTMENT_OTHER): Payer: Medicare Other | Admitting: Nurse Practitioner

## 2014-09-08 ENCOUNTER — Encounter: Payer: Self-pay | Admitting: Nurse Practitioner

## 2014-09-08 ENCOUNTER — Other Ambulatory Visit (HOSPITAL_BASED_OUTPATIENT_CLINIC_OR_DEPARTMENT_OTHER): Payer: Medicare Other

## 2014-09-08 VITALS — BP 153/72 | HR 60 | Temp 98.7°F | Resp 18 | Ht 61.0 in | Wt 178.7 lb

## 2014-09-08 DIAGNOSIS — C61 Malignant neoplasm of prostate: Secondary | ICD-10-CM | POA: Diagnosis not present

## 2014-09-08 DIAGNOSIS — M545 Low back pain: Secondary | ICD-10-CM

## 2014-09-08 DIAGNOSIS — E291 Testicular hypofunction: Secondary | ICD-10-CM

## 2014-09-08 DIAGNOSIS — C7951 Secondary malignant neoplasm of bone: Secondary | ICD-10-CM

## 2014-09-08 DIAGNOSIS — M25551 Pain in right hip: Secondary | ICD-10-CM | POA: Diagnosis not present

## 2014-09-08 DIAGNOSIS — M549 Dorsalgia, unspecified: Secondary | ICD-10-CM | POA: Insufficient documentation

## 2014-09-08 LAB — CBC WITH DIFFERENTIAL/PLATELET
BASO%: 0.8 % (ref 0.0–2.0)
BASOS ABS: 0.1 10*3/uL (ref 0.0–0.1)
EOS ABS: 0.1 10*3/uL (ref 0.0–0.5)
EOS%: 1.3 % (ref 0.0–7.0)
HEMATOCRIT: 37.1 % — AB (ref 38.4–49.9)
HGB: 11.6 g/dL — ABNORMAL LOW (ref 13.0–17.1)
LYMPH#: 2.9 10*3/uL (ref 0.9–3.3)
LYMPH%: 25.5 % (ref 14.0–49.0)
MCH: 26.4 pg — AB (ref 27.2–33.4)
MCHC: 31.4 g/dL — ABNORMAL LOW (ref 32.0–36.0)
MCV: 84.2 fL (ref 79.3–98.0)
MONO#: 0.7 10*3/uL (ref 0.1–0.9)
MONO%: 6.2 % (ref 0.0–14.0)
NEUT#: 7.5 10*3/uL — ABNORMAL HIGH (ref 1.5–6.5)
NEUT%: 66.2 % (ref 39.0–75.0)
Platelets: 246 10*3/uL (ref 140–400)
RBC: 4.4 10*6/uL (ref 4.20–5.82)
RDW: 14.8 % — ABNORMAL HIGH (ref 11.0–14.6)
WBC: 11.3 10*3/uL — AB (ref 4.0–10.3)

## 2014-09-08 LAB — COMPREHENSIVE METABOLIC PANEL (CC13)
ALBUMIN: 3.1 g/dL — AB (ref 3.5–5.0)
ALT: 13 U/L (ref 0–55)
AST: 18 U/L (ref 5–34)
Alkaline Phosphatase: 83 U/L (ref 40–150)
Anion Gap: 11 mEq/L (ref 3–11)
BUN: 11.1 mg/dL (ref 7.0–26.0)
CO2: 30 mEq/L — ABNORMAL HIGH (ref 22–29)
Calcium: 8.7 mg/dL (ref 8.4–10.4)
Chloride: 105 mEq/L (ref 98–109)
Creatinine: 0.8 mg/dL (ref 0.7–1.3)
EGFR: >90 mL/min/{1.73_m2} (ref >90)
Glucose: 91 mg/dl (ref 70–140)
POTASSIUM: 3.2 meq/L — AB (ref 3.5–5.1)
SODIUM: 146 meq/L — AB (ref 136–145)
Total Bilirubin: 0.59 mg/dL (ref 0.20–1.20)
Total Protein: 6.5 g/dL (ref 6.4–8.3)

## 2014-09-08 MED ORDER — DENOSUMAB 120 MG/1.7ML ~~LOC~~ SOLN
120.0000 mg | Freq: Once | SUBCUTANEOUS | Status: AC
  Administered 2014-09-08: 120 mg via SUBCUTANEOUS
  Filled 2014-09-08: qty 1.7

## 2014-09-08 MED ORDER — HYDROCODONE-ACETAMINOPHEN 5-325MG PREPACK (~~LOC~~: ORAL_TABLET | ORAL | Status: DC

## 2014-09-08 NOTE — Telephone Encounter (Signed)
Gave pt avs report and appts for april

## 2014-09-08 NOTE — Progress Notes (Signed)
Hematology and Oncology Follow Up Visit  [name] Mathews [id number] [birth year] 69 y.o. 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 He was inially diagnosed  2001, gleason score 4+5= 9.  Prior Therapy: 1. 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 He was started on hormone therapy, initially with Lupron.  2. Due to a rise in his PSA, Casodex was added to firmagon in 2009.  3. In 08/3011, his PSA was up to 16, with castrate level testosterone and bone involvement based on a bone scan on 08/2011.  Current therapy:  Zytiga 1000 mg po daily started in 09/2011 for a PSA of 22. He is on Prednisone 5 mg twice a day with excellent response, PSA dropping initially from 33 to 2.34. Xgeva 120 mg started on 10/31/2011. Repeated every visit.  Lupron given by Dr. Janice Mathews.   Interim History: [name] Mathews presents today for a follow up of his advanced prostate cancer. He continues to have lower back and right hip pain, but it is only intermittent, and it is no worse than previously described. He needs a refill on his vicodin today. Otherwise he offers no complaints. He is tolerating the Zytiga well with no side effects that he is aware of. He denies fevers, chills, nausea, vomiting, or changes in bowel or bladder habits. He is eating and drinking well, and denies unexplained weight loss. His energy level is good during the day, and he sleeps well at night. He denies neurological deficits or lower extremity weakness. He has no shortness of breath, chest pain, cough, swelling, or palpitations. He has no headaches, dizziness, or blurred vision. A detailed review of systems is otherwise stable.  Medications: I have reviewed the patient's current medications. Current Outpatient Prescriptions   Medication Sig Dispense Refill  . abiraterone Acetate (ZYTIGA) 250 MG tablet Take 4 tablets (1,000 mg total) by mouth daily. Take on an empty stomach 1 hour before or 2 hours after a meal 120 tablet 0  . atorvastatin (LIPITOR) 10 MG tablet Take 10 mg by mouth daily.    . Calcium Carbonate (CALTRATE 600 PO) Take 600 mg by mouth 3 (three) times daily.    [name] denosumab (XGEVA) 120 MG/1.7ML SOLN Inject 120 mg into the skin every 30 (thirty) days.    . DULoxetine (CYMBALTA) 30 MG capsule Take 30 mg by mouth daily.    . fish oil-omega-3 fatty acids 1000 MG capsule Take 1 g by mouth daily.    [name] HYDROcodone-acetaminophen (VICODIN) 5-325 mg TABS tablet Take one tablet by mouth every 6 hours as needed. 30 tablet 0  . mirabegron ER (MYRBETRIQ) 50 MG TB24 tablet Take 50 mg by mouth daily.    . predniSONE (DELTASONE) 5 MG tablet TAKE 1 TABLET (5 MG TOTAL) BY MOUTH 2 (TWO) TIMES DAILY. 60 tablet 1  . rizatriptan (MAXALT-MLT) 10 MG disintegrating tablet Take 10 mg by mouth as needed.     [name] rOPINIRole (REQUIP) 2 MG tablet Take 2 mg by mouth 2 (two) times daily.    . solifenacin (VESICARE) 5 MG tablet Take 10 mg by mouth daily.    . vitamin B-12 (CYANOCOBALAMIN) 1000 MCG tablet Take 1,000 mcg by mouth daily.    [name] EPIPEN 2-PAK 0.3 MG/0.3ML SOAJ injection      No current  facility-administered medications for this visit.     Allergies:  Allergies  Allergen Reactions  . Percocet [Oxycodone-Acetaminophen] Palpitations    His past medical history was reviewed today and is unchanged.    Physical Exam: Blood pressure 153/72, pulse 60, temperature 98.7 F (37.1 C), temperature source Oral, resp. rate 18, height 5\' 1"  (1.549 m), weight 178 lb 11.2 oz (81.058 kg), SpO2 98 %.  ECOG: 0  General appearance: alert not in any distress. Head: Normocephalic, without obvious abnormality Neck: no adenopathy Lymph nodes: Cervical, supraclavicular, and axillary nodes normal. Heart:regular rate and rhythm, S1, S2  normal, no murmur, click, rub or gallop Lung:chest clear, no wheezing, rales, normal symmetric air entry Abdomen: soft, non-tender, without masses or organomegaly Back examination: No skin rashes or lesions.  EXT:no erythema, induration, or nodules Skin examination: No rashes or lesions. No ecchymosis or petechiae appear Neurological examination: No motor or sensory deficits.   Lab Results: Lab Results  Component Value Date   WBC 11.3* 09/08/2014   HGB 11.6* 09/08/2014   HCT 37.1* 09/08/2014   MCV 84.2 09/08/2014   PLT 246 09/08/2014     Chemistry      Component Value Date/Time   NA 146* 09/08/2014 1317   NA 143 01/14/2012 0935   K 3.2* 09/08/2014 1317   K 4.4 01/14/2012 0935   CL 105 12/08/2012 1504   CL 107 01/14/2012 0935   CO2 30* 09/08/2014 1317   CO2 29 01/14/2012 0935   BUN 11.1 09/08/2014 1317   BUN 11 01/14/2012 0935   CREATININE 0.8 09/08/2014 1317   CREATININE 0.83 01/14/2012 0935      Component Value Date/Time   CALCIUM 8.7 09/08/2014 1317   CALCIUM 9.5 01/14/2012 0935   ALKPHOS 83 09/08/2014 1317   ALKPHOS 87 01/14/2012 0935   AST 18 09/08/2014 1317   AST 18 01/14/2012 0935   ALT 13 09/08/2014 1317   ALT 14 01/14/2012 0935   BILITOT 0.59 09/08/2014 1317   BILITOT 0.7 01/14/2012 0935     Results for [name], [name] (MRN [id number]) as of 09/08/2014 16:28  Ref. Range 04/14/2014 15:15 05/12/2014 15:27 06/09/2014 14:00 07/07/2014 11:43 08/04/2014 12:58  PSA Latest Range: <=4.00 ng/mL 6.75 (H) 7.02 (H) 10.72 (H) 10.27 (H) 11.65 (H)    Impression and Plan: This is a pleasant 69 year old gentleman with the following issues:  1. 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 He has an elevated PSA up to 33 in May of 2013. PSA dropped to 2.40. PSA slight elevated from 10.27 to 11.65 last month. A new level was drawn today, but was not yet available to review. It has been relatively stable in the last few  months.  Bone scan obtained on 03/07/2014 showed that his disease continued to be under excellent control with the help of Zytiga. The plan is to continue on the current dose and schedule. We will use a different agent if he has progression of disease based on imaging studies or rapid rise in his PSA.   2. Bone directed therapy. On Xgeva monthly with calcium supplementation. Dental hygiene encouraged. Continue to educate him about complications such as osteonecrosis of the jaw. None reported at this time.   3. Androgen deprivation: He is to continue Lupron under the care of Dr. Janice Mathews. Next injection scheduled 10/19/14  4. Lower back pain/ hip pain: intermittent at this time without any new bone pain. Prescription for vicodin refilled at this time.  5. Followup: in 4 weeks    Laurie Panda, NP 3/18/20164:17 PM

## 2014-09-09 LAB — PSA: PSA: 9.37 ng/mL — ABNORMAL HIGH (ref <=4.00)

## 2014-09-19 DIAGNOSIS — M5415 Radiculopathy, thoracolumbar region: Secondary | ICD-10-CM | POA: Diagnosis not present

## 2014-09-19 DIAGNOSIS — G609 Hereditary and idiopathic neuropathy, unspecified: Secondary | ICD-10-CM | POA: Diagnosis not present

## 2014-09-19 DIAGNOSIS — G2581 Restless legs syndrome: Secondary | ICD-10-CM | POA: Diagnosis not present

## 2014-09-19 DIAGNOSIS — G210 Malignant neuroleptic syndrome: Secondary | ICD-10-CM | POA: Diagnosis not present

## 2014-10-05 ENCOUNTER — Other Ambulatory Visit: Payer: Self-pay

## 2014-10-05 DIAGNOSIS — C61 Malignant neoplasm of prostate: Secondary | ICD-10-CM

## 2014-10-06 ENCOUNTER — Encounter: Payer: Self-pay | Admitting: Physician Assistant

## 2014-10-06 ENCOUNTER — Ambulatory Visit: Payer: Medicare Other

## 2014-10-06 ENCOUNTER — Ambulatory Visit (HOSPITAL_BASED_OUTPATIENT_CLINIC_OR_DEPARTMENT_OTHER): Payer: Medicare Other | Admitting: Physician Assistant

## 2014-10-06 ENCOUNTER — Other Ambulatory Visit (HOSPITAL_COMMUNITY)
Admission: RE | Admit: 2014-10-06 | Discharge: 2014-10-06 | Disposition: A | Discharge status: Home / Self Care | Payer: Medicare Other | Source: Ambulatory Visit | Attending: Oncology | Admitting: Oncology

## 2014-10-06 ENCOUNTER — Other Ambulatory Visit (HOSPITAL_BASED_OUTPATIENT_CLINIC_OR_DEPARTMENT_OTHER): Payer: Medicare Other

## 2014-10-06 ENCOUNTER — Telehealth: Payer: Self-pay | Admitting: Oncology

## 2014-10-06 VITALS — BP 141/86 | HR 74 | Temp 98.8°F | Resp 18 | Ht 61.0 in | Wt 177.2 lb

## 2014-10-06 DIAGNOSIS — C7951 Secondary malignant neoplasm of bone: Secondary | ICD-10-CM | POA: Diagnosis not present

## 2014-10-06 DIAGNOSIS — E291 Testicular hypofunction: Secondary | ICD-10-CM

## 2014-10-06 DIAGNOSIS — M545 Low back pain: Secondary | ICD-10-CM | POA: Diagnosis not present

## 2014-10-06 DIAGNOSIS — D649 Anemia, unspecified: Secondary | ICD-10-CM | POA: Diagnosis not present

## 2014-10-06 DIAGNOSIS — C61 Malignant neoplasm of prostate: Secondary | ICD-10-CM

## 2014-10-06 DIAGNOSIS — M25551 Pain in right hip: Secondary | ICD-10-CM | POA: Diagnosis not present

## 2014-10-06 LAB — COMPREHENSIVE METABOLIC PANEL (CC13)
ALT: 14 U/L (ref 0–55)
AST: 21 U/L (ref 5–34)
Albumin: 3.2 g/dL — ABNORMAL LOW (ref 3.5–5.0)
Alkaline Phosphatase: 80 U/L (ref 40–150)
Anion Gap: 14 mEq/L — ABNORMAL HIGH (ref 3–11)
BILIRUBIN TOTAL: 0.46 mg/dL (ref 0.20–1.20)
BUN: 9.3 mg/dL (ref 7.0–26.0)
CALCIUM: 7.3 mg/dL — AB (ref 8.4–10.4)
CO2: 25 mEq/L (ref 22–29)
CREATININE: 0.8 mg/dL (ref 0.7–1.3)
Chloride: 107 mEq/L (ref 98–109)
EGFR: >90 mL/min/{1.73_m2} (ref >90)
Glucose: 137 mg/dl (ref 70–140)
Potassium: 3.5 mEq/L (ref 3.5–5.1)
SODIUM: 146 meq/L — AB (ref 136–145)
Total Protein: 6.7 g/dL (ref 6.4–8.3)

## 2014-10-06 LAB — CBC WITH DIFFERENTIAL/PLATELET
BASO%: 0.2 % (ref 0.0–2.0)
Basophils Absolute: 0 10*3/uL (ref 0.0–0.1)
EOS%: 1.8 % (ref 0.0–7.0)
Eosinophils Absolute: 0.2 10*3/uL (ref 0.0–0.5)
HCT: 41.8 % (ref 38.4–49.9)
HGB: 12.9 g/dL — ABNORMAL LOW (ref 13.0–17.1)
LYMPH%: 26 % (ref 14.0–49.0)
MCH: 26.6 pg — AB (ref 27.2–33.4)
MCHC: 30.9 g/dL — AB (ref 32.0–36.0)
MCV: 86.2 fL (ref 79.3–98.0)
MONO#: 0.7 10*3/uL (ref 0.1–0.9)
MONO%: 7.2 % (ref 0.0–14.0)
NEUT#: 5.9 10*3/uL (ref 1.5–6.5)
NEUT%: 64.8 % (ref 39.0–75.0)
Platelets: 236 10*3/uL (ref 140–400)
RBC: 4.85 10*6/uL (ref 4.20–5.82)
RDW: 14.6 % (ref 11.0–14.6)
WBC: 9.1 10*3/uL (ref 4.0–10.3)
lymph#: 2.4 10*3/uL (ref 0.9–3.3)

## 2014-10-06 MED ORDER — DENOSUMAB 120 MG/1.7ML ~~LOC~~ SOLN: 120.0000 mg | Freq: Once | SUBCUTANEOUS | Status: DC

## 2014-10-06 MED ORDER — PREDNISONE 5 MG PO TABS: ORAL_TABLET | ORAL | Status: DC

## 2014-10-06 NOTE — Progress Notes (Signed)
[name] Mathews Calcium level 7.3 today.  Dr Alen Blew made aware of this.  Will hold X-geva injection today.  [name] Mathews states that he has not been taking his calcium supplement on a regular basis.  Instructed him to start taking calcium supplement 2 times daily.  States and understanding of this   Will recheck at next appointment.

## 2014-10-06 NOTE — Progress Notes (Signed)
Hematology and Oncology Follow Up Visit  [name] Mathews [id number] Aug 21, 1945 69 y.o. 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 He was inially diagnosed  2001, gleason score 4+5= 9.  Prior Therapy: 1. 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 He was started on hormone therapy, initially with Lupron.  2. Due to a rise in his PSA, Casodex was added to firmagon in 2009.  3. In 08/3011, his PSA was up to 16, with castrate level testosterone and bone involvement based on a bone scan on 08/2011.  Current therapy:  Zytiga 1000 mg po daily started in 09/2011 for a PSA of 22. He is on Prednisone 5 mg twice a day with excellent response, PSA dropping initially from 33 to 2.34. Xgeva 120 mg started on 10/31/2011. Repeated every visit.  Lupron given by Dr. Janice Norrie.   Interim History: [name] Mathews presents today for a follow up of his advanced prostate cancer. He continues to have lower back and right hip pain, but it is only intermittent, and it is no worse than previously described. He needs a refill on his prednisone today. Otherwise he offers no complaints. He is tolerating the Zytiga well with no side effects that he is aware of. He denies fevers, chills, nausea, vomiting, or changes in bowel or bladder habits. He is eating and drinking well, and denies unexplained weight loss. His energy level is good during the day, and he sleeps well at night. He denies neurological deficits or lower extremity weakness. He has no shortness of breath, chest pain, cough, swelling, or palpitations. He has no headaches, dizziness, or blurred vision. The remainder of his detailed review of systems is otherwise stable.  Medications: I have reviewed the patient's current medications. Current  Outpatient Prescriptions  Medication Sig Dispense Refill  . abiraterone Acetate (ZYTIGA) 250 MG tablet Take 4 tablets (1,000 mg total) by mouth daily. Take on an empty stomach 1 hour before or 2 hours after a meal 120 tablet 0  . atorvastatin (LIPITOR) 10 MG tablet Take 10 mg by mouth daily.    . Calcium Carbonate (CALTRATE 600 PO) Take 600 mg by mouth 3 (three) times daily.    [name] denosumab (XGEVA) 120 MG/1.7ML SOLN Inject 120 mg into the skin every 30 (thirty) days.    . DULoxetine (CYMBALTA) 30 MG capsule Take 30 mg by mouth daily.    [name] EPIPEN 2-PAK 0.3 MG/0.3ML SOAJ injection     . fish oil-omega-3 fatty acids 1000 MG capsule Take 1 g by mouth daily.    [name] HYDROcodone-acetaminophen (VICODIN) 5-325 mg TABS tablet Take one tablet by mouth every 6 hours as needed. 30 tablet 0  . predniSONE (DELTASONE) 5 MG tablet TAKE 1 TABLET (5 MG TOTAL) BY MOUTH 2 (TWO) TIMES DAILY. 60 tablet 1  . rizatriptan (MAXALT-MLT) 10 MG disintegrating tablet Take 10 mg by mouth as needed.     [name] rOPINIRole (REQUIP) 2 MG tablet Take 2 mg by mouth 2 (two) times daily.    . vitamin B-12 (CYANOCOBALAMIN) 1000 MCG tablet Take 1,000 mcg by mouth daily.    . mirabegron ER (MYRBETRIQ) 50 MG TB24 tablet Take 50 mg by mouth daily.    . solifenacin (VESICARE) 5 MG tablet Take 10 mg by mouth daily.  No current facility-administered medications for this visit.     Allergies:  Allergies  Allergen Reactions  . Percocet [Oxycodone-Acetaminophen] Palpitations    His past medical history was reviewed today and is unchanged.    Physical Exam: Blood pressure 141/86, pulse 74, temperature 98.8 F (37.1 C), temperature source Oral, resp. rate 18, height 5\' 1"  (1.549 m), weight 177 lb 3.2 oz (80.377 kg), SpO2 99 %.  ECOG: 0  General appearance: alert not in any distress. Head: Normocephalic, without obvious abnormality Neck: no adenopathy Lymph nodes: Cervical, supraclavicular, and axillary nodes normal. Heart:regular  rate and rhythm, S1, S2 normal, no murmur, click, rub or gallop Lung:chest clear, no wheezing, rales, normal symmetric air entry Abdomen: soft, non-tender, without masses or organomegaly Back examination: No skin rashes or lesions.  EXT:no erythema, induration, or nodules Skin examination: No rashes or lesions. No ecchymosis or petechiae appear Neurological examination: No motor or sensory deficits.   Lab Results: Lab Results  Component Value Date   WBC 9.1 10/06/2014   HGB 12.9* 10/06/2014   HCT 41.8 10/06/2014   MCV 86.2 10/06/2014   PLT 236 10/06/2014     Chemistry      Component Value Date/Time   NA 146* 10/06/2014 1327   NA 143 01/14/2012 0935   K 3.5 10/06/2014 1327   K 4.4 01/14/2012 0935   CL 105 12/08/2012 1504   CL 107 01/14/2012 0935   CO2 25 10/06/2014 1327   CO2 29 01/14/2012 0935   BUN 9.3 10/06/2014 1327   BUN 11 01/14/2012 0935   CREATININE 0.8 10/06/2014 1327   CREATININE 0.83 01/14/2012 0935      Component Value Date/Time   CALCIUM 7.3* 10/06/2014 1327   CALCIUM 9.5 01/14/2012 0935   ALKPHOS 80 10/06/2014 1327   ALKPHOS 87 01/14/2012 0935   AST 21 10/06/2014 1327   AST 18 01/14/2012 0935   ALT 14 10/06/2014 1327   ALT 14 01/14/2012 0935   BILITOT 0.46 10/06/2014 1327   BILITOT 0.7 01/14/2012 0935     Results for [name] Mathews (MRN [id number]) as of 09/08/2014 16:28  Ref. Range 04/14/2014 15:15 05/12/2014 15:27 06/09/2014 14:00 07/07/2014 11:43 08/04/2014 12:58  PSA Latest Range: <=4.00 ng/mL 6.75 (H) 7.02 (H) 10.72 (H) 10.27 (H) 11.65 (H)    Impression and Plan: This is a pleasant 69 year old gentleman with the following issues:  1. 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 He has an elevated PSA up to 33 in May of 2013. PSA dropped to 2.40. PSA slight elevated from 10.27 to 11.65 last month. A new level was drawn today, but was not yet available to review. It has been relatively  stable in the last few months.  Bone scan obtained on 03/07/2014 showed that his disease continued to be under excellent control with the help of Zytiga. The plan is to continue on the current dose and schedule. We will use a different agent if he has progression of disease based on imaging studies or rapid rise in his PSA.   2. Bone directed therapy. On Xgeva monthly with calcium supplementation. Dental hygiene encouraged. Continue to educate him about complications such as osteonecrosis of the jaw. None reported at this time.   3. Androgen deprivation: He is to continue Lupron under the care of Dr. Janice Norrie. Next injection scheduled 10/19/14  4. Lower back pain/ hip pain: intermittent at this time without any new bone pain. Well controlled with Vicodin.  5. A refill prescription for his prednisone was sent to his pharmacy of record via [facility].  6. Followup: in 4 weeks    Carlton Adam, [place] 4/15/20164:15 PM

## 2014-10-06 NOTE — Telephone Encounter (Signed)
Gave patient avs report and appointments for May  °

## 2014-10-07 LAB — PSA: PSA: 6.58 ng/mL — ABNORMAL HIGH (ref <=4.00)

## 2014-10-08 NOTE — Patient Instructions (Signed)
Continue prednisone and Zytiga as prescribed Follow up in one month

## 2014-10-09 ENCOUNTER — Other Ambulatory Visit: Payer: Self-pay | Admitting: *Deleted

## 2014-10-09 DIAGNOSIS — C61 Malignant neoplasm of prostate: Secondary | ICD-10-CM

## 2014-10-09 MED ORDER — ABIRATERONE ACETATE 250 MG PO TABS: 1000.0000 mg | ORAL_TABLET | Freq: Every day | ORAL | Status: DC

## 2014-10-19 DIAGNOSIS — C61 Malignant neoplasm of prostate: Secondary | ICD-10-CM | POA: Diagnosis not present

## 2014-10-23 DIAGNOSIS — H25811 Combined forms of age-related cataract, right eye: Secondary | ICD-10-CM | POA: Diagnosis not present

## 2014-10-25 DIAGNOSIS — H25811 Combined forms of age-related cataract, right eye: Secondary | ICD-10-CM | POA: Diagnosis not present

## 2014-10-25 DIAGNOSIS — H268 Other specified cataract: Secondary | ICD-10-CM | POA: Diagnosis not present

## 2014-10-31 DIAGNOSIS — G629 Polyneuropathy, unspecified: Secondary | ICD-10-CM | POA: Diagnosis not present

## 2014-10-31 DIAGNOSIS — G2581 Restless legs syndrome: Secondary | ICD-10-CM | POA: Diagnosis not present

## 2014-10-31 DIAGNOSIS — E785 Hyperlipidemia, unspecified: Secondary | ICD-10-CM | POA: Diagnosis not present

## 2014-10-31 DIAGNOSIS — E538 Deficiency of other specified B group vitamins: Secondary | ICD-10-CM | POA: Diagnosis not present

## 2014-10-31 DIAGNOSIS — Z91038 Other insect allergy status: Secondary | ICD-10-CM | POA: Diagnosis not present

## 2014-11-01 DIAGNOSIS — H268 Other specified cataract: Secondary | ICD-10-CM | POA: Diagnosis not present

## 2014-11-01 DIAGNOSIS — H25812 Combined forms of age-related cataract, left eye: Secondary | ICD-10-CM | POA: Diagnosis not present

## 2014-11-03 ENCOUNTER — Other Ambulatory Visit (HOSPITAL_BASED_OUTPATIENT_CLINIC_OR_DEPARTMENT_OTHER): Payer: Medicare Other

## 2014-11-03 ENCOUNTER — Ambulatory Visit (HOSPITAL_BASED_OUTPATIENT_CLINIC_OR_DEPARTMENT_OTHER): Payer: Medicare Other

## 2014-11-03 ENCOUNTER — Ambulatory Visit (HOSPITAL_BASED_OUTPATIENT_CLINIC_OR_DEPARTMENT_OTHER): Payer: Medicare Other | Admitting: Oncology

## 2014-11-03 ENCOUNTER — Telehealth: Payer: Self-pay | Admitting: Oncology

## 2014-11-03 VITALS — BP 139/71 | HR 64 | Temp 98.1°F | Resp 16 | Ht 61.0 in | Wt 174.7 lb

## 2014-11-03 DIAGNOSIS — C61 Malignant neoplasm of prostate: Secondary | ICD-10-CM

## 2014-11-03 DIAGNOSIS — M545 Low back pain: Secondary | ICD-10-CM | POA: Diagnosis not present

## 2014-11-03 DIAGNOSIS — M25551 Pain in right hip: Secondary | ICD-10-CM | POA: Diagnosis not present

## 2014-11-03 DIAGNOSIS — C7951 Secondary malignant neoplasm of bone: Secondary | ICD-10-CM

## 2014-11-03 LAB — CBC WITH DIFFERENTIAL/PLATELET
BASO%: 0.4 % (ref 0.0–2.0)
BASOS ABS: 0 10*3/uL (ref 0.0–0.1)
EOS ABS: 0 10*3/uL (ref 0.0–0.5)
EOS%: 0.3 % (ref 0.0–7.0)
HEMATOCRIT: 40.4 % (ref 38.4–49.9)
HGB: 12.9 g/dL — ABNORMAL LOW (ref 13.0–17.1)
LYMPH#: 1.8 10*3/uL (ref 0.9–3.3)
LYMPH%: 15.3 % (ref 14.0–49.0)
MCH: 26.4 pg — ABNORMAL LOW (ref 27.2–33.4)
MCHC: 32 g/dL (ref 32.0–36.0)
MCV: 82.7 fL (ref 79.3–98.0)
MONO#: 0.7 10*3/uL (ref 0.1–0.9)
MONO%: 6 % (ref 0.0–14.0)
NEUT#: 9 10*3/uL — ABNORMAL HIGH (ref 1.5–6.5)
NEUT%: 78 % — AB (ref 39.0–75.0)
Platelets: 224 10*3/uL (ref 140–400)
RBC: 4.88 10*6/uL (ref 4.20–5.82)
RDW: 14.8 % — ABNORMAL HIGH (ref 11.0–14.6)
WBC: 11.5 10*3/uL — ABNORMAL HIGH (ref 4.0–10.3)

## 2014-11-03 LAB — COMPREHENSIVE METABOLIC PANEL (CC13)
ALBUMIN: 3.2 g/dL — AB (ref 3.5–5.0)
ALT: 10 U/L (ref 0–55)
AST: 15 U/L (ref 5–34)
Alkaline Phosphatase: 84 U/L (ref 40–150)
Anion Gap: 9 mEq/L (ref 3–11)
BILIRUBIN TOTAL: 0.35 mg/dL (ref 0.20–1.20)
BUN: 14.6 mg/dL (ref 7.0–26.0)
CHLORIDE: 109 meq/L (ref 98–109)
CO2: 26 mEq/L (ref 22–29)
Calcium: 9.3 mg/dL (ref 8.4–10.4)
Creatinine: 0.8 mg/dL (ref 0.7–1.3)
EGFR: >90 mL/min/{1.73_m2} (ref >90)
Glucose: 124 mg/dl (ref 70–140)
Potassium: 4 mEq/L (ref 3.5–5.1)
SODIUM: 144 meq/L (ref 136–145)
Total Protein: 6.7 g/dL (ref 6.4–8.3)

## 2014-11-03 MED ORDER — DENOSUMAB 120 MG/1.7ML ~~LOC~~ SOLN
120.0000 mg | Freq: Once | SUBCUTANEOUS | Status: AC
  Administered 2014-11-03: 120 mg via SUBCUTANEOUS
  Filled 2014-11-03: qty 1.7

## 2014-11-03 NOTE — Telephone Encounter (Signed)
Pt confirmed labs/ovinj per 05/13 POF, gave pt AVS and Calendar.... KJ

## 2014-11-03 NOTE — Progress Notes (Signed)
Hematology and Oncology Follow Up Visit  [name] Mathews [id number] 05-Sep-1945 69 y.o. 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 He was inially diagnosed  2001, gleason score 4+5= 9.  Prior Therapy: 1. 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 He was started on hormone therapy, initially with Lupron.  2. Due to a rise in his PSA, Casodex was added to firmagon in 2009.  3. In 08/3011, his PSA was up to 16, with castrate level testosterone and bone involvement based on a bone scan on 08/2011.  Current therapy:  Zytiga 1000 mg po daily started in 09/2011 for a PSA of 22. He is on Prednisone 5 mg twice a day with excellent response, PSA dropping initially from 33 to 2.34. Xgeva 120 mg started on 10/31/2011. Repeated every visit.  Lupron given by Dr. Janice Norrie.   Interim History: [name] Mathews presents today for a follow up by himself. Since the last visit, he is doing very well. He continues to have lower back and right hip pain, but his pain has been very mild and very infrequent and recurrence.Otherwise he offers no complaints. He is tolerating the Zytiga well with no side effects that he is aware of. He continues to be very active and performs activities of daily living. He denies fevers, chills, nausea, vomiting, or changes in bowel or bladder habits. He is eating and drinking well, and denies unexplained weight loss. His energy level is good during the day, and he sleeps well at night. He denies neurological deficits or lower extremity weakness. He has no shortness of breath, chest pain, cough, swelling, or palpitations. He has no headaches, dizziness, or blurred vision. The remainder of his detailed review of systems is otherwise stable.  Medications: I have reviewed the  patient's current medications. Current Outpatient Prescriptions  Medication Sig Dispense Refill  . abiraterone Acetate (ZYTIGA) 250 MG tablet Take 4 tablets (1,000 mg total) by mouth daily. Take on an empty stomach 1 hour before or 2 hours after a meal 120 tablet 0  . atorvastatin (LIPITOR) 10 MG tablet Take 10 mg by mouth daily.    . Calcium Carbonate (CALTRATE 600 PO) Take 600 mg by mouth 3 (three) times daily.    [name] denosumab (XGEVA) 120 MG/1.7ML SOLN Inject 120 mg into the skin every 30 (thirty) days.    . DULoxetine (CYMBALTA) 30 MG capsule Take 30 mg by mouth daily.    [name] EPIPEN 2-PAK 0.3 MG/0.3ML SOAJ injection     . fish oil-omega-3 fatty acids 1000 MG capsule Take 1 g by mouth daily.    [name] HYDROcodone-acetaminophen (VICODIN) 5-325 mg TABS tablet Take one tablet by mouth every 6 hours as needed. 30 tablet 0  . predniSONE (DELTASONE) 5 MG tablet TAKE 1 TABLET (5 MG TOTAL) BY MOUTH 2 (TWO) TIMES DAILY. 60 tablet 1  . rizatriptan (MAXALT-MLT) 10 MG disintegrating tablet Take 10 mg by mouth as needed.     [name] rOPINIRole (REQUIP) 2 MG tablet Take 2 mg by mouth 2 (two) times daily.    . solifenacin (VESICARE) 5 MG tablet Take 10 mg by mouth daily.    . vitamin B-12 (CYANOCOBALAMIN) 1000 MCG tablet Take 1,000 mcg by mouth daily.     No current facility-administered medications for this visit.  Facility-Administered Medications Ordered in Other Visits  Medication Dose Route Frequency Provider Last Rate Last Dose  . denosumab (XGEVA) injection 120 mg  120 mg Subcutaneous Once Maryanna Shape, NP         Allergies:  Allergies  Allergen Reactions  . Percocet [Oxycodone-Acetaminophen] Palpitations    His past medical history was reviewed today and is unchanged.    Physical Exam: Blood pressure 139/71, pulse 64, temperature 98.1 F (36.7 C), temperature source Oral, resp. rate 16, height 5\' 1"  (1.549 m), weight 174 lb 11.2 oz (79.243 kg), SpO2 100 %.  ECOG: 0  General appearance:  alert not in any distress. Head: Normocephalic, without obvious abnormality Neck: no adenopathy Lymph nodes: Cervical, supraclavicular, and axillary nodes normal. Heart:regular rate and rhythm, S1, S2 normal, no murmur, click, rub or gallop Lung:chest clear, no wheezing, rales, normal symmetric air entry Abdomen: soft, non-tender, without masses or organomegaly Back examination: No skin rashes or lesions.  EXT:no erythema, induration, or nodules Skin examination: No rashes or lesions.  Neurological examination: No motor or sensory deficits.   Lab Results: Lab Results  Component Value Date   WBC 11.5* 11/03/2014   HGB 12.9* 11/03/2014   HCT 40.4 11/03/2014   MCV 82.7 11/03/2014   PLT 224 11/03/2014     Chemistry      Component Value Date/Time   NA 144 11/03/2014 1449   NA 143 01/14/2012 0935   K 4.0 11/03/2014 1449   K 4.4 01/14/2012 0935   CL 105 12/08/2012 1504   CL 107 01/14/2012 0935   CO2 26 11/03/2014 1449   CO2 29 01/14/2012 0935   BUN 14.6 11/03/2014 1449   BUN 11 01/14/2012 0935   CREATININE 0.8 11/03/2014 1449   CREATININE 0.83 01/14/2012 0935      Component Value Date/Time   CALCIUM 9.3 11/03/2014 1449   CALCIUM 9.5 01/14/2012 0935   ALKPHOS 84 11/03/2014 1449   ALKPHOS 87 01/14/2012 0935   AST 15 11/03/2014 1449   AST 18 01/14/2012 0935   ALT 10 11/03/2014 1449   ALT 14 01/14/2012 0935   BILITOT 0.35 11/03/2014 1449   BILITOT 0.7 01/14/2012 0935      Results for [name] Mathews, [name] Mathews (MRN [id number]) as of 11/03/2014 14:23  Ref. Range 07/07/2014 11:43 08/04/2014 12:58 09/08/2014 13:17 10/06/2014 13:32  PSA Latest Ref Range: <=4.00 ng/mL 10.27 (H) 11.65 (H) 9.37 (H) 6.58 (H)    Impression and Plan: This is a pleasant 69 year old gentleman with the following issues:  1. 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 He has an elevated PSA up to 33 in May of 2013.   He is currently on Zytiga and have  had an excellent response so far. His PSA continued to be relatively low at 6.58. He is tolerating this medication well and plan to continue the same dose and schedule. We will use different salvage therapy if his disease progresses rapidly at any time.  2. Bone directed therapy. On Xgeva monthly with calcium supplementation. Dental hygiene encouraged. Continue to educate him about complications such as osteonecrosis of the jaw.   3. Androgen deprivation: He is to continue Lupron under the care of Dr. Janice Norrie.   4. Lower back pain/ hip pain: intermittent at this time without any new bone pain.   5. Followup: in 4 weeks    ELFYBO,FBPZW, MD 5/13/20163:35 PM

## 2014-11-04 LAB — PSA: PSA: 7.88 ng/mL — ABNORMAL HIGH (ref <=4.00)

## 2014-11-08 ENCOUNTER — Other Ambulatory Visit: Payer: Self-pay | Admitting: *Deleted

## 2014-11-08 DIAGNOSIS — C61 Malignant neoplasm of prostate: Secondary | ICD-10-CM

## 2014-11-08 MED ORDER — ABIRATERONE ACETATE 250 MG PO TABS: 1000.0000 mg | ORAL_TABLET | Freq: Every day | ORAL | Status: DC

## 2014-12-08 ENCOUNTER — Other Ambulatory Visit (HOSPITAL_BASED_OUTPATIENT_CLINIC_OR_DEPARTMENT_OTHER): Payer: Medicare Other

## 2014-12-08 ENCOUNTER — Encounter: Payer: Self-pay | Admitting: Physician Assistant

## 2014-12-08 ENCOUNTER — Telehealth: Payer: Self-pay | Admitting: Oncology

## 2014-12-08 ENCOUNTER — Ambulatory Visit (HOSPITAL_BASED_OUTPATIENT_CLINIC_OR_DEPARTMENT_OTHER): Payer: Medicare Other | Admitting: Physician Assistant

## 2014-12-08 ENCOUNTER — Ambulatory Visit (HOSPITAL_BASED_OUTPATIENT_CLINIC_OR_DEPARTMENT_OTHER): Payer: Medicare Other

## 2014-12-08 VITALS — BP 159/73 | HR 66 | Temp 98.5°F | Resp 20 | Wt 175.6 lb

## 2014-12-08 DIAGNOSIS — E291 Testicular hypofunction: Secondary | ICD-10-CM | POA: Diagnosis not present

## 2014-12-08 DIAGNOSIS — C61 Malignant neoplasm of prostate: Secondary | ICD-10-CM

## 2014-12-08 DIAGNOSIS — C7951 Secondary malignant neoplasm of bone: Secondary | ICD-10-CM

## 2014-12-08 DIAGNOSIS — M545 Low back pain, unspecified: Secondary | ICD-10-CM

## 2014-12-08 DIAGNOSIS — R208 Other disturbances of skin sensation: Secondary | ICD-10-CM

## 2014-12-08 DIAGNOSIS — M25551 Pain in right hip: Secondary | ICD-10-CM | POA: Diagnosis not present

## 2014-12-08 LAB — CBC WITH DIFFERENTIAL/PLATELET
BASO%: 0.1 % (ref 0.0–2.0)
Basophils Absolute: 0 10*3/uL (ref 0.0–0.1)
EOS%: 0.2 % (ref 0.0–7.0)
Eosinophils Absolute: 0 10*3/uL (ref 0.0–0.5)
HCT: 39.3 % (ref 38.4–49.9)
HEMOGLOBIN: 12.6 g/dL — AB (ref 13.0–17.1)
LYMPH%: 9.9 % — ABNORMAL LOW (ref 14.0–49.0)
MCH: 27.2 pg (ref 27.2–33.4)
MCHC: 32.1 g/dL (ref 32.0–36.0)
MCV: 84.9 fL (ref 79.3–98.0)
MONO#: 0.5 10*3/uL (ref 0.1–0.9)
MONO%: 4.9 % (ref 0.0–14.0)
NEUT#: 8.9 10*3/uL — ABNORMAL HIGH (ref 1.5–6.5)
NEUT%: 84.9 % — ABNORMAL HIGH (ref 39.0–75.0)
Platelets: 224 10*3/uL (ref 140–400)
RBC: 4.63 10*6/uL (ref 4.20–5.82)
RDW: 14.3 % (ref 11.0–14.6)
WBC: 10.5 10*3/uL — ABNORMAL HIGH (ref 4.0–10.3)
lymph#: 1 10*3/uL (ref 0.9–3.3)

## 2014-12-08 LAB — COMPREHENSIVE METABOLIC PANEL (CC13)
ALT: 10 U/L (ref 0–55)
ANION GAP: 7 meq/L (ref 3–11)
AST: 18 U/L (ref 5–34)
Albumin: 3.4 g/dL — ABNORMAL LOW (ref 3.5–5.0)
Alkaline Phosphatase: 84 U/L (ref 40–150)
BUN: 10.7 mg/dL (ref 7.0–26.0)
CO2: 33 meq/L — AB (ref 22–29)
CREATININE: 0.8 mg/dL (ref 0.7–1.3)
Calcium: 9.3 mg/dL (ref 8.4–10.4)
Chloride: 104 mEq/L (ref 98–109)
GLUCOSE: 109 mg/dL (ref 70–140)
Potassium: 3.6 mEq/L (ref 3.5–5.1)
SODIUM: 144 meq/L (ref 136–145)
Total Bilirubin: 0.53 mg/dL (ref 0.20–1.20)
Total Protein: 6.7 g/dL (ref 6.4–8.3)

## 2014-12-08 MED ORDER — DENOSUMAB 120 MG/1.7ML ~~LOC~~ SOLN
120.0000 mg | Freq: Once | SUBCUTANEOUS | Status: AC
  Administered 2014-12-08: 120 mg via SUBCUTANEOUS
  Filled 2014-12-08: qty 1.7

## 2014-12-08 NOTE — Patient Instructions (Signed)
Try to be aware of how you are placing your right arm and elbow is you may be irritating the nerve that feeds your fourth and fifth fingers Continue Zytiga at the current dose Follow-up in one month

## 2014-12-08 NOTE — Progress Notes (Signed)
Hematology and Oncology Follow Up Visit  [name] Mathews [id number] 1946-05-06 69 y.o. 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 He was inially diagnosed  2001, gleason score 4+5= 9.  Prior Therapy: 1. 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 He was started on hormone therapy, initially with Lupron.  2. Due to a rise in his PSA, Casodex was added to firmagon in 2009.  3. In 08/3011, his PSA was up to 16, with castrate level testosterone and bone involvement based on a bone scan on 08/2011.  Current therapy:  Zytiga 1000 mg po daily started in 09/2011 for a PSA of 22. He is on Prednisone 5 mg twice a day with excellent response, PSA dropping initially from 33 to 2.34. Xgeva 120 mg started on 10/31/2011. Repeated every visit.  Lupron given by Dr. Janice Norrie.   Interim History: [name] Mathews presents today for a follow up by himself. Since the last visit, he is doing very well. He reports his back pain has not been problematic lately. He does note some tingling in the fourth and fifth fingers of the right hand extending to the level of the elbow that has been present since [facility]. He occasionally has some pain but this does not occur daily. He denied any issues with grip strength or fine motor coordination. He admits that he usually sleeps on his right side and further admits that the right arm/hand ends up in odd positions. He continues to have lower back and right hip pain, but his pain has been very mild and very infrequent in recurrence.Otherwise he offers no complaints. He is tolerating the Zytiga well with no side effects that he is aware of. He continues to be very active and performs activities of daily living. He reports over the past couple of days he's been doing  a lot of yard work. He also states he has not slept many hours last couple of nights. He denies fevers, chills, nausea, vomiting, or changes in bowel or bladder habits. He is eating and drinking well, and denies unexplained weight loss. His energy level is good during the day, and he usually sleeps well at night. He denies neurological deficits or lower extremity weakness. He has no shortness of breath, chest pain, cough, swelling, or palpitations. He has no headaches, dizziness, or blurred vision. The remainder of his detailed review of systems is otherwise stable.  Medications: I have reviewed the patient's current medications. Current Outpatient Prescriptions  Medication Sig Dispense Refill  . abiraterone Acetate (ZYTIGA) 250 MG tablet Take 4 tablets (1,000 mg total) by mouth daily. Take on an empty stomach 1 hour before or 2 hours after a meal 120 tablet 0  . atorvastatin (LIPITOR) 10 MG tablet Take 10 mg by mouth daily.    . Calcium Carbonate (CALTRATE 600 PO) Take 600 mg by mouth 3 (three) times daily.    [name] denosumab (XGEVA) 120 MG/1.7ML SOLN Inject 120 mg into the skin every 30 (thirty) days.    . DULoxetine (CYMBALTA) 30 MG capsule Take 30 mg by mouth daily.    [name] EPIPEN 2-PAK 0.3 MG/0.3ML SOAJ injection     . fish oil-omega-3 fatty acids 1000 MG capsule Take 1 g by mouth daily.    [name] HYDROcodone-acetaminophen (VICODIN) 5-325 mg TABS  tablet Take one tablet by mouth every 6 hours as needed. 30 tablet 0  . predniSONE (DELTASONE) 5 MG tablet TAKE 1 TABLET (5 MG TOTAL) BY MOUTH 2 (TWO) TIMES DAILY. 60 tablet 1  . rizatriptan (MAXALT-MLT) 10 MG disintegrating tablet Take 10 mg by mouth as needed.     [name] rOPINIRole (REQUIP) 2 MG tablet Take 2 mg by mouth 2 (two) times daily.    . solifenacin (VESICARE) 5 MG tablet Take 10 mg by mouth daily.    . vitamin B-12 (CYANOCOBALAMIN) 1000 MCG tablet Take 1,000 mcg by mouth daily.     No current facility-administered medications for this visit.      Allergies:  Allergies  Allergen Reactions  . Percocet [Oxycodone-Acetaminophen] Palpitations    His past medical history was reviewed today and is unchanged.    Physical Exam: Blood pressure 159/73, pulse 66, temperature 98.5 F (36.9 C), temperature source Oral, resp. rate 20, weight 175 lb 9.6 oz (79.652 kg), SpO2 99 %.  ECOG: 0  General appearance: alert not in any distress. Head: Normocephalic, without obvious abnormality Neck: no adenopathy Lymph nodes: Cervical, supraclavicular, and axillary nodes normal. Heart:regular rate and rhythm, S1, S2 normal, no murmur, click, rub or gallop Lung:chest clear, no wheezing, rales, normal symmetric air entry Abdomen: soft, non-tender, without masses or organomegaly Back examination: No skin rashes or lesions.  EXT:no erythema, induration, or nodules Skin examination: No rashes or lesions.  Neurological examination: No motor or sensory deficits. Grip strength is normal in both hands   Lab Results: Lab Results  Component Value Date   WBC 10.5* 12/08/2014   HGB 12.6* 12/08/2014   HCT 39.3 12/08/2014   MCV 84.9 12/08/2014   PLT 224 12/08/2014     Chemistry      Component Value Date/Time   NA 144 12/08/2014 1208   NA 143 01/14/2012 0935   K 3.6 12/08/2014 1208   K 4.4 01/14/2012 0935   CL 105 12/08/2012 1504   CL 107 01/14/2012 0935   CO2 33* 12/08/2014 1208   CO2 29 01/14/2012 0935   BUN 10.7 12/08/2014 1208   BUN 11 01/14/2012 0935   CREATININE 0.8 12/08/2014 1208   CREATININE 0.83 01/14/2012 0935      Component Value Date/Time   CALCIUM 9.3 12/08/2014 1208   CALCIUM 9.5 01/14/2012 0935   ALKPHOS 84 12/08/2014 1208   ALKPHOS 87 01/14/2012 0935   AST 18 12/08/2014 1208   AST 18 01/14/2012 0935   ALT 10 12/08/2014 1208   ALT 14 01/14/2012 0935   BILITOT 0.53 12/08/2014 1208   BILITOT 0.7 01/14/2012 0935      Results for [name] Mathews, [name] Mathews (MRN [id number]) as of 11/03/2014 14:23  Ref. Range 07/07/2014 11:43  08/04/2014 12:58 09/08/2014 13:17 10/06/2014 13:32  PSA Latest Ref Range: <=4.00 ng/mL 10.27 (H) 11.65 (H) 9.37 (H) 6.58 (H)    Impression and Plan: This is a pleasant 69 year old gentleman with the following issues:  1. 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 He has an elevated PSA up to 33 in May of 2013.   He is currently on Zytiga and have had an excellent response so far. His PSA continued to be relatively low at 6.58. He is tolerating this medication well and plan to continue the same dose and schedule. We will use different salvage therapy if his disease progresses rapidly at any time.  2. Bone directed therapy. On Xgeva monthly with  calcium supplementation. Dental hygiene encouraged. Continue to educate him about complications such as osteonecrosis of the jaw.   3. Androgen deprivation: He is to continue Lupron under the care of Dr. Janice Norrie.   4. Lower back pain/ hip pain: intermittent at this time without any new bone pain.   5. Paresthesias involving the fourth and fifth finger of the right hand: This may represent ulnar nerve irritation/compression. I recommended that Mr. Crapps tried to be aware of the way/position that he is sleeping and be cognizant of how he is placing pressure on his elbow/forearm area. If this numbness/tingling persists or worsens further evaluation may be necessary.  6. Followup: in 4 weeks    Carlton Adam, [place] 6/17/20161:47 PM

## 2014-12-08 NOTE — Telephone Encounter (Signed)
Gave and printed appt sched and avs for pt for July  °

## 2014-12-09 LAB — PSA: PSA: 8.48 ng/mL — AB (ref <=4.00)

## 2014-12-12 ENCOUNTER — Other Ambulatory Visit: Payer: Self-pay | Admitting: *Deleted

## 2014-12-12 DIAGNOSIS — C61 Malignant neoplasm of prostate: Secondary | ICD-10-CM

## 2014-12-12 MED ORDER — ABIRATERONE ACETATE 250 MG PO TABS: 1000.0000 mg | ORAL_TABLET | Freq: Every day | ORAL | Status: DC

## 2015-01-10 ENCOUNTER — Other Ambulatory Visit: Payer: Self-pay | Admitting: *Deleted

## 2015-01-10 DIAGNOSIS — C61 Malignant neoplasm of prostate: Secondary | ICD-10-CM

## 2015-01-10 MED ORDER — ABIRATERONE ACETATE 250 MG PO TABS: 1000.0000 mg | ORAL_TABLET | Freq: Every day | ORAL | Status: DC

## 2015-01-17 ENCOUNTER — Ambulatory Visit (HOSPITAL_BASED_OUTPATIENT_CLINIC_OR_DEPARTMENT_OTHER): Payer: Medicare Other

## 2015-01-17 ENCOUNTER — Telehealth: Payer: Self-pay | Admitting: Oncology

## 2015-01-17 ENCOUNTER — Other Ambulatory Visit (HOSPITAL_BASED_OUTPATIENT_CLINIC_OR_DEPARTMENT_OTHER): Payer: Medicare Other

## 2015-01-17 ENCOUNTER — Ambulatory Visit (HOSPITAL_BASED_OUTPATIENT_CLINIC_OR_DEPARTMENT_OTHER): Payer: Medicare Other | Admitting: Oncology

## 2015-01-17 VITALS — BP 146/80 | HR 85 | Temp 98.1°F | Resp 18 | Ht 61.0 in | Wt 177.0 lb

## 2015-01-17 DIAGNOSIS — E291 Testicular hypofunction: Secondary | ICD-10-CM | POA: Diagnosis not present

## 2015-01-17 DIAGNOSIS — C7951 Secondary malignant neoplasm of bone: Secondary | ICD-10-CM

## 2015-01-17 DIAGNOSIS — C61 Malignant neoplasm of prostate: Secondary | ICD-10-CM

## 2015-01-17 LAB — COMPREHENSIVE METABOLIC PANEL (CC13)
ALK PHOS: 79 U/L (ref 40–150)
ALT: 9 U/L (ref 0–55)
AST: 18 U/L (ref 5–34)
Albumin: 3.2 g/dL — ABNORMAL LOW (ref 3.5–5.0)
Anion Gap: 9 mEq/L (ref 3–11)
BUN: 9.7 mg/dL (ref 7.0–26.0)
CO2: 29 mEq/L (ref 22–29)
CREATININE: 0.8 mg/dL (ref 0.7–1.3)
Calcium: 9.2 mg/dL (ref 8.4–10.4)
Chloride: 110 mEq/L — ABNORMAL HIGH (ref 98–109)
EGFR: >90 mL/min/{1.73_m2} (ref >90)
Glucose: 96 mg/dl (ref 70–140)
POTASSIUM: 3.5 meq/L (ref 3.5–5.1)
Sodium: 147 mEq/L — ABNORMAL HIGH (ref 136–145)
TOTAL PROTEIN: 6.6 g/dL (ref 6.4–8.3)
Total Bilirubin: 0.28 mg/dL (ref 0.20–1.20)

## 2015-01-17 LAB — CBC WITH DIFFERENTIAL/PLATELET
BASO%: 0.3 % (ref 0.0–2.0)
BASOS ABS: 0 10*3/uL (ref 0.0–0.1)
EOS%: 1.6 % (ref 0.0–7.0)
Eosinophils Absolute: 0.1 10*3/uL (ref 0.0–0.5)
HCT: 38.3 % — ABNORMAL LOW (ref 38.4–49.9)
HEMOGLOBIN: 12.4 g/dL — AB (ref 13.0–17.1)
LYMPH%: 25 % (ref 14.0–49.0)
MCH: 26.5 pg — ABNORMAL LOW (ref 27.2–33.4)
MCHC: 32.3 g/dL (ref 32.0–36.0)
MCV: 82 fL (ref 79.3–98.0)
MONO#: 0.6 10*3/uL (ref 0.1–0.9)
MONO%: 6.1 % (ref 0.0–14.0)
NEUT%: 67 % (ref 39.0–75.0)
NEUTROS ABS: 6.2 10*3/uL (ref 1.5–6.5)
Platelets: 226 10*3/uL (ref 140–400)
RBC: 4.67 10*6/uL (ref 4.20–5.82)
RDW: 14.5 % (ref 11.0–14.6)
WBC: 9.3 10*3/uL (ref 4.0–10.3)
lymph#: 2.3 10*3/uL (ref 0.9–3.3)

## 2015-01-17 MED ORDER — DENOSUMAB 120 MG/1.7ML ~~LOC~~ SOLN
120.0000 mg | Freq: Once | SUBCUTANEOUS | Status: AC
  Administered 2015-01-17: 120 mg via SUBCUTANEOUS
  Filled 2015-01-17: qty 1.7

## 2015-01-17 NOTE — Progress Notes (Signed)
Hematology and Oncology Follow Up Visit  [name] Mathews [id number] 11/07/1945 69 y.o. 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 He was inially diagnosed  2001, gleason score 4+5= 9.  Prior Therapy: 1. 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 He was started on hormone therapy, initially with Lupron.  2. Due to a rise in his PSA, Casodex was added to firmagon in 2009.  3. In 08/3011, his PSA was up to 16, with castrate level testosterone and bone involvement based on a bone scan on 08/2011. PSA eventually went up to 33.  Current therapy:  Zytiga 1000 mg po daily started in 09/2011 for a PSA of 22. He is on Prednisone 5 mg twice a day with excellent response, PSA dropping initially from 33 to 2.34. Xgeva 120 mg started on 10/31/2011. Repeated every visit.  Lupron given by [name] Mathews.   Interim History: [name] Mathews presents today for a follow up by himself. Since the last visit, he reports no new complaints. He continues to have intermittent back pain that have subsided at this time. He did have it for a few days over the weekend, but his pain has been very mild and very infrequent. He reports no neurological deficits or symptoms. Otherwise he offers no complaints. He is tolerating the Zytiga well with no side effects that he is aware of. He continues to be very active and performs activities of daily living. He continues to drive and have excellent quality of life.   He denies fevers, chills, nausea, vomiting, or changes in bowel or bladder habits. He is eating and drinking well, and denies unexplained weight loss. His energy level is good during the day, and he sleeps well at night. He denies neurological deficits or lower extremity weakness. He has no shortness of  breath, chest pain, cough, swelling, or palpitations. He has no headaches, dizziness, or blurred vision. The remainder of his detailed review of systems is otherwise unremarkable  Medications: I have reviewed the patient's current medications. Current Outpatient Prescriptions  Medication Sig Dispense Refill  . abiraterone Acetate (ZYTIGA) 250 MG tablet Take 4 tablets (1,000 mg total) by mouth daily. Take on an empty stomach 1 hour before or 2 hours after a meal 120 tablet 0  . atorvastatin (LIPITOR) 10 MG tablet Take 10 mg by mouth daily.    . Calcium Carbonate (CALTRATE 600 PO) Take 600 mg by mouth 3 (three) times daily.    [name] denosumab (XGEVA) 120 MG/1.7ML SOLN Inject 120 mg into the skin every 30 (thirty) days.    . DULoxetine (CYMBALTA) 30 MG capsule Take 30 mg by mouth daily.    [name] EPIPEN 2-PAK 0.3 MG/0.3ML SOAJ injection     . fish oil-omega-3 fatty acids 1000 MG capsule Take 1 g by mouth daily.    [name] HYDROcodone-acetaminophen (VICODIN) 5-325 mg TABS tablet Take one tablet by mouth every 6 hours as needed. 30 tablet 0  . predniSONE (DELTASONE) 5 MG tablet TAKE 1 TABLET (5 MG TOTAL) BY MOUTH 2 (TWO) TIMES DAILY. 60 tablet 1  . rizatriptan (MAXALT-MLT) 10 MG disintegrating tablet Take 10 mg by mouth as needed.     [name] rOPINIRole (REQUIP) 2 MG tablet Take 2 mg by mouth 2 (two) times daily.    . vitamin  B-12 (CYANOCOBALAMIN) 1000 MCG tablet Take 1,000 mcg by mouth daily.     No current facility-administered medications for this visit.     Allergies:  Allergies  Allergen Reactions  . Percocet [Oxycodone-Acetaminophen] Palpitations    His past medical history was reviewed today and is unchanged.    Physical Exam: Blood pressure 146/80, pulse 85, temperature 98.1 F (36.7 C), temperature source Oral, resp. rate 18, height 5\' 1"  (1.549 m), weight 177 lb (80.287 kg), SpO2 99 %.  ECOG: 0  General appearance: alert awake gentleman not in any distress. Head: Normocephalic, without obvious  abnormality Neck: no adenopathy Lymph nodes: Cervical, supraclavicular, and axillary nodes normal. Heart:regular rate and rhythm, S1, S2 normal, no murmur, click, rub or gallop Lung:chest clear, no wheezing, rales, normal symmetric air entry Abdomen: soft, non-tender, without masses or organomegaly Back examination: No skin rashes or lesions.  EXT:no erythema, induration, or nodules Neurological examination: No motor or sensory deficits.   Lab Results: Lab Results  Component Value Date   WBC 9.3 01/17/2015   HGB 12.4* 01/17/2015   HCT 38.3* 01/17/2015   MCV 82.0 01/17/2015   PLT 226 01/17/2015     Chemistry      Component Value Date/Time   NA 144 12/08/2014 1208   NA 143 01/14/2012 0935   K 3.6 12/08/2014 1208   K 4.4 01/14/2012 0935   CL 105 12/08/2012 1504   CL 107 01/14/2012 0935   CO2 33* 12/08/2014 1208   CO2 29 01/14/2012 0935   BUN 10.7 12/08/2014 1208   BUN 11 01/14/2012 0935   CREATININE 0.8 12/08/2014 1208   CREATININE 0.83 01/14/2012 0935      Component Value Date/Time   CALCIUM 9.3 12/08/2014 1208   CALCIUM 9.5 01/14/2012 0935   ALKPHOS 84 12/08/2014 1208   ALKPHOS 87 01/14/2012 0935   AST 18 12/08/2014 1208   AST 18 01/14/2012 0935   ALT 10 12/08/2014 1208   ALT 14 01/14/2012 0935   BILITOT 0.53 12/08/2014 1208   BILITOT 0.7 01/14/2012 0935       Results for [name], Mathews (MRN [id number]) as of 01/17/2015 10:16  Ref. Range 09/08/2014 13:17 10/06/2014 13:32 11/03/2014 14:48 12/08/2014 12:08  PSA Latest Ref Range: <=4.00 ng/mL 9.37 (H) 6.58 (H) 7.88 (H) 8.48 (H)     Impression and Plan: This is a pleasant 69 year old gentleman with the following issues:  1. 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 He has an elevated PSA up to 33 in May of 2013.   He is currently on Zytiga and have had an excellent response so far. His PSA continued to be relatively low range of less then 10. He is  tolerating this medication well and have excellent quality of life.   The plan is to continue the same dose and schedule. We will use different salvage therapy if his disease progresses rapidly at any time. He will require restaging workup with a bone scan and CT scan if his PSA starts to rise rapidly.  2. Bone directed therapy. On Xgeva monthly with calcium supplementation. Dental hygiene encouraged. Continue to educate him about complications such as osteonecrosis of the jaw.   3. Androgen deprivation: He is to continue Lupron under the care of [name] Mathews.   4. Lower back pain/ hip pain: intermittent at this time and less likely related to malignancy. We will continue to monitor this closely in future visits. He is pain-free today and  does not require any pain medication.  5. Followup: in 4 weeks    YTSSQS,YPZXA, MD 7/27/201610:25 AM

## 2015-01-17 NOTE — Telephone Encounter (Signed)
Gave adn printed appt sched and avs for pt for Aug °

## 2015-01-18 LAB — PSA: PSA: 7.55 ng/mL — ABNORMAL HIGH (ref <=4.00)

## 2015-01-25 DIAGNOSIS — G609 Hereditary and idiopathic neuropathy, unspecified: Secondary | ICD-10-CM | POA: Diagnosis not present

## 2015-01-25 DIAGNOSIS — G2581 Restless legs syndrome: Secondary | ICD-10-CM | POA: Diagnosis not present

## 2015-01-25 DIAGNOSIS — G210 Malignant neuroleptic syndrome: Secondary | ICD-10-CM | POA: Diagnosis not present

## 2015-01-25 DIAGNOSIS — M5415 Radiculopathy, thoracolumbar region: Secondary | ICD-10-CM | POA: Diagnosis not present

## 2015-02-01 ENCOUNTER — Telehealth: Payer: Self-pay | Admitting: Physician Assistant

## 2015-02-01 NOTE — Telephone Encounter (Signed)
Received fax from [facility] for request to refill medication for Mr [name] Mathews. [facility]all  For Prednisone 5 mg tablet  60 qty. To take 1 tablet by mouth twice a day. Fax filled out by [facility] request was authorization for this refill plus 1. Fax sent and confirmed received at 4:16

## 2015-02-06 ENCOUNTER — Other Ambulatory Visit: Payer: Self-pay | Admitting: *Deleted

## 2015-02-06 DIAGNOSIS — C61 Malignant neoplasm of prostate: Secondary | ICD-10-CM

## 2015-02-06 MED ORDER — ABIRATERONE ACETATE 250 MG PO TABS: 1000.0000 mg | ORAL_TABLET | Freq: Every day | ORAL | Status: DC

## 2015-02-12 ENCOUNTER — Telehealth: Payer: Self-pay | Admitting: Oncology

## 2015-02-12 NOTE — Telephone Encounter (Signed)
s.w. pt and r/s appt due to pt in jury duty....pt ok and aware

## 2015-02-14 ENCOUNTER — Other Ambulatory Visit: Payer: BLUE CROSS/BLUE SHIELD

## 2015-02-14 ENCOUNTER — Ambulatory Visit: Payer: BLUE CROSS/BLUE SHIELD | Admitting: Physician Assistant

## 2015-02-14 ENCOUNTER — Ambulatory Visit: Payer: BLUE CROSS/BLUE SHIELD

## 2015-02-19 DIAGNOSIS — R51 Headache: Secondary | ICD-10-CM | POA: Diagnosis not present

## 2015-02-19 DIAGNOSIS — R55 Syncope and collapse: Secondary | ICD-10-CM | POA: Diagnosis not present

## 2015-02-19 DIAGNOSIS — F419 Anxiety disorder, unspecified: Secondary | ICD-10-CM | POA: Diagnosis not present

## 2015-02-22 ENCOUNTER — Other Ambulatory Visit: Payer: BLUE CROSS/BLUE SHIELD

## 2015-02-22 ENCOUNTER — Ambulatory Visit: Payer: BLUE CROSS/BLUE SHIELD | Admitting: Physician Assistant

## 2015-02-22 ENCOUNTER — Ambulatory Visit: Payer: BLUE CROSS/BLUE SHIELD

## 2015-02-28 ENCOUNTER — Telehealth: Payer: Self-pay | Admitting: Internal Medicine

## 2015-02-28 ENCOUNTER — Telehealth: Payer: Self-pay | Admitting: Oncology

## 2015-02-28 NOTE — Telephone Encounter (Signed)
s.w. pt and r/s cx appt.Marland KitchenMarland KitchenMarland KitchenMarland Kitchenpt ok and aware of new d.t

## 2015-02-28 NOTE — Telephone Encounter (Signed)
returned call and lvm for pt regarding to calling to back to r/s cx appt.

## 2015-03-02 ENCOUNTER — Telehealth: Payer: Self-pay | Admitting: Physician Assistant

## 2015-03-02 ENCOUNTER — Other Ambulatory Visit (HOSPITAL_BASED_OUTPATIENT_CLINIC_OR_DEPARTMENT_OTHER): Payer: Medicare Other

## 2015-03-02 ENCOUNTER — Ambulatory Visit (HOSPITAL_BASED_OUTPATIENT_CLINIC_OR_DEPARTMENT_OTHER): Payer: Medicare Other | Admitting: Physician Assistant

## 2015-03-02 ENCOUNTER — Ambulatory Visit (HOSPITAL_BASED_OUTPATIENT_CLINIC_OR_DEPARTMENT_OTHER): Payer: Medicare Other

## 2015-03-02 ENCOUNTER — Encounter: Payer: Self-pay | Admitting: Physician Assistant

## 2015-03-02 VITALS — BP 151/84 | HR 86 | Temp 98.8°F | Resp 18 | Ht 61.0 in | Wt 174.6 lb

## 2015-03-02 DIAGNOSIS — C61 Malignant neoplasm of prostate: Secondary | ICD-10-CM

## 2015-03-02 DIAGNOSIS — M545 Low back pain: Secondary | ICD-10-CM

## 2015-03-02 DIAGNOSIS — C7951 Secondary malignant neoplasm of bone: Secondary | ICD-10-CM | POA: Diagnosis not present

## 2015-03-02 LAB — COMPREHENSIVE METABOLIC PANEL (CC13)
ALT: 8 U/L (ref 0–55)
AST: 16 U/L (ref 5–34)
Albumin: 3.6 g/dL (ref 3.5–5.0)
Alkaline Phosphatase: 99 U/L (ref 40–150)
Anion Gap: 8 mEq/L (ref 3–11)
BUN: 13 mg/dL (ref 7.0–26.0)
CHLORIDE: 105 meq/L (ref 98–109)
CO2: 31 meq/L — AB (ref 22–29)
CREATININE: 0.9 mg/dL (ref 0.7–1.3)
Calcium: 9.9 mg/dL (ref 8.4–10.4)
EGFR: >90 mL/min/{1.73_m2} (ref >90)
Glucose: 95 mg/dl (ref 70–140)
POTASSIUM: 4 meq/L (ref 3.5–5.1)
Sodium: 144 mEq/L (ref 136–145)
Total Bilirubin: 0.53 mg/dL (ref 0.20–1.20)
Total Protein: 7 g/dL (ref 6.4–8.3)

## 2015-03-02 LAB — CBC WITH DIFFERENTIAL/PLATELET
BASO%: 0.9 % (ref 0.0–2.0)
Basophils Absolute: 0.1 10*3/uL (ref 0.0–0.1)
EOS%: 0.1 % (ref 0.0–7.0)
Eosinophils Absolute: 0 10*3/uL (ref 0.0–0.5)
HCT: 40 % (ref 38.4–49.9)
HGB: 12.9 g/dL — ABNORMAL LOW (ref 13.0–17.1)
LYMPH%: 12.5 % — AB (ref 14.0–49.0)
MCH: 26.2 pg — ABNORMAL LOW (ref 27.2–33.4)
MCHC: 32.2 g/dL (ref 32.0–36.0)
MCV: 81.3 fL (ref 79.3–98.0)
MONO#: 0.6 10*3/uL (ref 0.1–0.9)
MONO%: 4.6 % (ref 0.0–14.0)
NEUT#: 10 10*3/uL — ABNORMAL HIGH (ref 1.5–6.5)
NEUT%: 81.9 % — AB (ref 39.0–75.0)
Platelets: 250 10*3/uL (ref 140–400)
RBC: 4.92 10*6/uL (ref 4.20–5.82)
RDW: 14.8 % — ABNORMAL HIGH (ref 11.0–14.6)
WBC: 12.1 10*3/uL — ABNORMAL HIGH (ref 4.0–10.3)
lymph#: 1.5 10*3/uL (ref 0.9–3.3)

## 2015-03-02 MED ORDER — HYDROCODONE-ACETAMINOPHEN 5-325MG PREPACK (~~LOC~~: ORAL_TABLET | ORAL | Status: DC

## 2015-03-02 MED ORDER — DENOSUMAB 120 MG/1.7ML ~~LOC~~ SOLN
120.0000 mg | Freq: Once | SUBCUTANEOUS | Status: AC
  Administered 2015-03-02: 120 mg via SUBCUTANEOUS
  Filled 2015-03-02: qty 1.7

## 2015-03-02 NOTE — Progress Notes (Signed)
Hematology and Oncology Follow Up Visit  JASKARAN DAUZAT [id number] 1946-02-02 69 y.o. 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 He was inially diagnosed  2001, gleason score 4+5= 9.  Prior Therapy: 1. 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 He was started on hormone therapy, initially with Lupron.  2. Due to a rise in his PSA, Casodex was added to firmagon in 2009.  3. In 08/3011, his PSA was up to 16, with castrate level testosterone and bone involvement based on a bone scan on 08/2011. PSA eventually went up to 33.  Current therapy:  Zytiga 1000 mg po daily started in 09/2011 for a PSA of 22. He is on Prednisone 5 mg twice a day with excellent response, PSA dropping initially from 33 to 2.34. Xgeva 120 mg started on 10/31/2011. Repeated every visit.  Lupron given by Dr. Janice Norrie.   Interim History: Mr. Lave presents today for a follow up visit unaccompanied. Since the last visit, he reports no new complaints. He continues to have intermittent back pain. He requests a refill for his hydrocodone tablets. He reports no neurological deficits or symptoms. Otherwise he offers no complaints. He is tolerating the Zytiga well with no side effects that he is aware of. He continues to be very active and performs activities of daily living. He continues to drive and have excellent quality of life.   He denies fevers, chills, nausea, vomiting, or changes in bowel or bladder habits. He is eating and drinking well, and denies unexplained weight loss. His energy level is good during the day, and he sleeps well at night. He denies neurological deficits or lower extremity weakness. He has no shortness of breath, chest pain, cough, swelling, or palpitations. He has no headaches,  dizziness, or blurred vision. The remainder of his detailed review of systems is otherwise unremarkable  Medications: I have reviewed the patient's current medications. Current Outpatient Prescriptions  Medication Sig Dispense Refill  . abiraterone Acetate (ZYTIGA) 250 MG tablet Take 4 tablets (1,000 mg total) by mouth daily. Take on an empty stomach 1 hour before or 2 hours after a meal 120 tablet 0  . atorvastatin (LIPITOR) 10 MG tablet Take 10 mg by mouth daily.    . Calcium Carbonate (CALTRATE 600 PO) Take 600 mg by mouth 3 (three) times daily.    [name] denosumab (XGEVA) 120 MG/1.7ML SOLN Inject 120 mg into the skin every 30 (thirty) days.    . DULoxetine (CYMBALTA) 60 MG capsule Take 60 mg by mouth daily.  2  . EPIPEN 2-PAK 0.3 MG/0.3ML SOAJ injection     . fish oil-omega-3 fatty acids 1000 MG capsule Take 1 g by mouth daily.    [name] HYDROcodone-acetaminophen (VICODIN) 5-325 mg TABS tablet Take one tablet by mouth every 6 hours as needed. 30 tablet 0  . predniSONE (DELTASONE) 5 MG tablet TAKE 1 TABLET (5 MG TOTAL) BY MOUTH 2 (TWO) TIMES DAILY. 60 tablet 1  . rizatriptan (MAXALT-MLT) 10 MG disintegrating tablet Take 10 mg by mouth as needed.     [name] rOPINIRole (REQUIP) 2 MG tablet Take 2 mg by mouth 2 (two) times daily.    . vitamin B-12 (CYANOCOBALAMIN) 1000 MCG tablet Take 1,000 mcg by mouth daily.     No current facility-administered medications  for this visit.   Facility-Administered Medications Ordered in Other Visits  Medication Dose Route Frequency Provider Last Rate Last Dose  . denosumab (XGEVA) injection 120 mg  120 mg Subcutaneous Once Maryanna Shape, NP         Allergies:  Allergies  Allergen Reactions  . Percocet [Oxycodone-Acetaminophen] Palpitations    His past medical history was reviewed today and is unchanged.    Physical Exam: Blood pressure 151/84, pulse 86, temperature 98.8 F (37.1 C), temperature source Oral, resp. rate 18, height 5\' 1"  (1.549 m), weight 174  lb 9.6 oz (79.198 kg), SpO2 98 %.  ECOG: 0  General appearance: alert awake gentleman not in any distress. Head: Normocephalic, without obvious abnormality Neck: no adenopathy Lymph nodes: Cervical, supraclavicular, and axillary nodes normal. Heart:regular rate and rhythm, S1, S2 normal, no murmur, click, rub or gallop Lung:chest clear, no wheezing, rales, normal symmetric air entry Abdomen: soft, non-tender, without masses or organomegaly Back examination: No skin rashes or lesions.  EXT:no erythema, induration, or nodules Neurological examination: No motor or sensory deficits.   Lab Results: Lab Results  Component Value Date   WBC 12.1* 03/02/2015   HGB 12.9* 03/02/2015   HCT 40.0 03/02/2015   MCV 81.3 03/02/2015   PLT 250 03/02/2015     Chemistry      Component Value Date/Time   NA 144 03/02/2015 1444   NA 143 01/14/2012 0935   K 4.0 03/02/2015 1444   K 4.4 01/14/2012 0935   CL 105 12/08/2012 1504   CL 107 01/14/2012 0935   CO2 31* 03/02/2015 1444   CO2 29 01/14/2012 0935   BUN 13.0 03/02/2015 1444   BUN 11 01/14/2012 0935   CREATININE 0.9 03/02/2015 1444   CREATININE 0.83 01/14/2012 0935      Component Value Date/Time   CALCIUM 9.9 03/02/2015 1444   CALCIUM 9.5 01/14/2012 0935   ALKPHOS 99 03/02/2015 1444   ALKPHOS 87 01/14/2012 0935   AST 16 03/02/2015 1444   AST 18 01/14/2012 0935   ALT 8 03/02/2015 1444   ALT 14 01/14/2012 0935   BILITOT 0.53 03/02/2015 1444   BILITOT 0.7 01/14/2012 0935       Results for KYLIL, SWOPES (MRN [id number]) as of 01/17/2015 10:16  Ref. Range 09/08/2014 13:17 10/06/2014 13:32 11/03/2014 14:48 12/08/2014 12:08  PSA Latest Ref Range: <=4.00 ng/mL 9.37 (H) 6.58 (H) 7.88 (H) 8.48 (H)     Impression and Plan: This is a pleasant 69 year old gentleman with the following issues:  1. 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 He has an elevated PSA up to 33 in May of  2013.   He is currently on Zytiga and have had an excellent response so far. His PSA had been relatively low range of less then 10. He is tolerating this medication well and have excellent quality of life. PSA from today has increased to 19.51.  The plan is to continue the same dose and schedule. We will use different salvage therapy if his disease progresses rapidly at any time. He will require restaging workup with a bone scan and CT scan if his PSA starts to rise rapidly.  2. Bone directed therapy. On Xgeva monthly with calcium supplementation. Dental hygiene encouraged. Continue to educate him about complications such as osteonecrosis of the jaw.   3. Androgen deprivation: He is to continue Lupron under the care of Dr. Janice Norrie.   4. Lower back pain/ hip  pain: intermittent at this time and less likely related to malignancy. We will continue to monitor this closely in future visits. He was given a refill prescription for his hydrocodone tablets.  5. Followup: in 4 weeks with Dr. Alen Blew.     Carlton Adam, PA-C 9/9/20163:31 PM

## 2015-03-02 NOTE — Telephone Encounter (Signed)
per pof to sch pt appt-gave pt copy of avs °

## 2015-03-03 LAB — PSA: PSA: 19.51 ng/mL — ABNORMAL HIGH (ref <=4.00)

## 2015-03-07 ENCOUNTER — Other Ambulatory Visit: Payer: Self-pay | Admitting: *Deleted

## 2015-03-07 DIAGNOSIS — E538 Deficiency of other specified B group vitamins: Secondary | ICD-10-CM | POA: Diagnosis not present

## 2015-03-07 DIAGNOSIS — C61 Malignant neoplasm of prostate: Secondary | ICD-10-CM

## 2015-03-07 DIAGNOSIS — I10 Essential (primary) hypertension: Secondary | ICD-10-CM | POA: Diagnosis not present

## 2015-03-07 DIAGNOSIS — N3281 Overactive bladder: Secondary | ICD-10-CM | POA: Diagnosis not present

## 2015-03-07 DIAGNOSIS — G2581 Restless legs syndrome: Secondary | ICD-10-CM | POA: Diagnosis not present

## 2015-03-07 DIAGNOSIS — E785 Hyperlipidemia, unspecified: Secondary | ICD-10-CM | POA: Diagnosis not present

## 2015-03-07 MED ORDER — ABIRATERONE ACETATE 250 MG PO TABS: 1000.0000 mg | ORAL_TABLET | Freq: Every day | ORAL | Status: DC

## 2015-03-08 NOTE — Patient Instructions (Signed)
Continue Zytiga at the current dose Follow up in one month 

## 2015-03-14 ENCOUNTER — Encounter: Payer: Self-pay | Admitting: Oncology

## 2015-03-14 NOTE — Progress Notes (Signed)
Per [facility] zytiga has been shipped via [employer]

## 2015-03-30 ENCOUNTER — Other Ambulatory Visit: Payer: BLUE CROSS/BLUE SHIELD

## 2015-03-30 ENCOUNTER — Ambulatory Visit: Payer: BLUE CROSS/BLUE SHIELD | Admitting: Oncology

## 2015-03-30 ENCOUNTER — Telehealth: Payer: Self-pay | Admitting: Oncology

## 2015-03-30 NOTE — Telephone Encounter (Signed)
S.w. Pt and r/s missed appt...the patient ok and aware of new d.t

## 2015-04-02 ENCOUNTER — Other Ambulatory Visit: Payer: Self-pay | Admitting: Oncology

## 2015-04-02 ENCOUNTER — Ambulatory Visit: Payer: BLUE CROSS/BLUE SHIELD | Admitting: Oncology

## 2015-04-02 ENCOUNTER — Other Ambulatory Visit: Payer: BLUE CROSS/BLUE SHIELD

## 2015-04-02 ENCOUNTER — Ambulatory Visit: Payer: BLUE CROSS/BLUE SHIELD

## 2015-04-02 DIAGNOSIS — C61 Malignant neoplasm of prostate: Secondary | ICD-10-CM

## 2015-04-05 ENCOUNTER — Telehealth: Payer: Self-pay | Admitting: *Deleted

## 2015-04-05 NOTE — Telephone Encounter (Signed)
[facility] faxed Zytiga 250 mg refill request.  Request to provider's desk/in-basket for review.

## 2015-04-10 ENCOUNTER — Other Ambulatory Visit: Payer: Self-pay | Admitting: *Deleted

## 2015-04-10 ENCOUNTER — Encounter: Payer: Self-pay | Admitting: *Deleted

## 2015-04-10 DIAGNOSIS — C61 Malignant neoplasm of prostate: Secondary | ICD-10-CM

## 2015-04-10 MED ORDER — ABIRATERONE ACETATE 250 MG PO TABS: 1000.0000 mg | ORAL_TABLET | Freq: Every day | ORAL | Status: DC
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
## 2015-04-13 ENCOUNTER — Encounter: Payer: Self-pay | Admitting: Oncology

## 2015-04-13 NOTE — Progress Notes (Signed)
Per [facility] zytiga was shipped via [employer]

## 2015-04-26 DIAGNOSIS — G210 Malignant neuroleptic syndrome: Secondary | ICD-10-CM | POA: Diagnosis not present

## 2015-04-26 DIAGNOSIS — G2581 Restless legs syndrome: Secondary | ICD-10-CM | POA: Diagnosis not present

## 2015-04-26 DIAGNOSIS — M5415 Radiculopathy, thoracolumbar region: Secondary | ICD-10-CM | POA: Diagnosis not present

## 2015-04-26 DIAGNOSIS — G609 Hereditary and idiopathic neuropathy, unspecified: Secondary | ICD-10-CM | POA: Diagnosis not present

## 2015-05-02 ENCOUNTER — Encounter (HOSPITAL_COMMUNITY)
Admission: RE | Admit: 2015-05-02 | Discharge: 2015-05-02 | Disposition: A | Discharge status: Home / Self Care | Payer: Medicare Other | Source: Ambulatory Visit | Attending: Oncology | Admitting: Oncology

## 2015-05-02 ENCOUNTER — Ambulatory Visit (HOSPITAL_COMMUNITY)
Admission: RE | Admit: 2015-05-02 | Discharge: 2015-05-02 | Disposition: A | Discharge status: Home / Self Care | Payer: Medicare Other | Source: Ambulatory Visit | Attending: Oncology | Admitting: Oncology

## 2015-05-02 ENCOUNTER — Other Ambulatory Visit (HOSPITAL_BASED_OUTPATIENT_CLINIC_OR_DEPARTMENT_OTHER): Payer: Medicare Other

## 2015-05-02 DIAGNOSIS — C61 Malignant neoplasm of prostate: Secondary | ICD-10-CM | POA: Diagnosis not present

## 2015-05-02 DIAGNOSIS — Z08 Encounter for follow-up examination after completed treatment for malignant neoplasm: Secondary | ICD-10-CM | POA: Diagnosis not present

## 2015-05-02 DIAGNOSIS — N329 Bladder disorder, unspecified: Secondary | ICD-10-CM | POA: Insufficient documentation

## 2015-05-02 DIAGNOSIS — N62 Hypertrophy of breast: Secondary | ICD-10-CM | POA: Insufficient documentation

## 2015-05-02 DIAGNOSIS — M899 Disorder of bone, unspecified: Secondary | ICD-10-CM | POA: Insufficient documentation

## 2015-05-02 LAB — COMPREHENSIVE METABOLIC PANEL (CC13)
ALT: 12 U/L (ref 0–55)
ANION GAP: 8 meq/L (ref 3–11)
AST: 21 U/L (ref 5–34)
Albumin: 3.2 g/dL — ABNORMAL LOW (ref 3.5–5.0)
Alkaline Phosphatase: 77 U/L (ref 40–150)
BUN: 8.2 mg/dL (ref 7.0–26.0)
CALCIUM: 10 mg/dL (ref 8.4–10.4)
CO2: 30 mEq/L — ABNORMAL HIGH (ref 22–29)
Chloride: 104 mEq/L (ref 98–109)
Creatinine: 0.8 mg/dL (ref 0.7–1.3)
EGFR: >90 mL/min/{1.73_m2} (ref >90)
Glucose: 99 mg/dl (ref 70–140)
POTASSIUM: 4.1 meq/L (ref 3.5–5.1)
Sodium: 142 mEq/L (ref 136–145)
Total Bilirubin: 0.43 mg/dL (ref 0.20–1.20)
Total Protein: 6.5 g/dL (ref 6.4–8.3)

## 2015-05-02 LAB — CBC WITH DIFFERENTIAL/PLATELET
BASO%: 0.1 % (ref 0.0–2.0)
BASOS ABS: 0 10*3/uL (ref 0.0–0.1)
EOS%: 1.8 % (ref 0.0–7.0)
Eosinophils Absolute: 0.1 10*3/uL (ref 0.0–0.5)
HEMATOCRIT: 40 % (ref 38.4–49.9)
HGB: 12.5 g/dL — ABNORMAL LOW (ref 13.0–17.1)
LYMPH#: 2 10*3/uL (ref 0.9–3.3)
LYMPH%: 27 % (ref 14.0–49.0)
MCH: 26.6 pg — AB (ref 27.2–33.4)
MCHC: 31.3 g/dL — AB (ref 32.0–36.0)
MCV: 85.1 fL (ref 79.3–98.0)
MONO#: 0.5 10*3/uL (ref 0.1–0.9)
MONO%: 7.4 % (ref 0.0–14.0)
NEUT#: 4.7 10*3/uL (ref 1.5–6.5)
NEUT%: 63.7 % (ref 39.0–75.0)
PLATELETS: 210 10*3/uL (ref 140–400)
RBC: 4.7 10*6/uL (ref 4.20–5.82)
RDW: 14.8 % — ABNORMAL HIGH (ref 11.0–14.6)
WBC: 7.3 10*3/uL (ref 4.0–10.3)

## 2015-05-02 MED ORDER — IOHEXOL 300 MG/ML  SOLN
100.0000 mL | Freq: Once | INTRAMUSCULAR | Status: AC | PRN
  Administered 2015-05-02: 100 mL via INTRAVENOUS

## 2015-05-02 MED ORDER — TECHNETIUM TC 99M MEDRONATE IV KIT
26.9000 | PACK | Freq: Once | INTRAVENOUS | Status: DC | PRN
Start: 2015-05-02 — End: 2015-05-08

## 2015-05-02 MED ORDER — IOHEXOL 300 MG/ML  SOLN
50.0000 mL | Freq: Once | INTRAMUSCULAR | Status: AC | PRN
Start: 2015-05-02 — End: 2015-05-02
  Administered 2015-05-02: 50 mL via ORAL

## 2015-05-03 LAB — PSA: PSA: 11.64 ng/mL — AB (ref <=4.00)

## 2015-05-11 ENCOUNTER — Other Ambulatory Visit: Payer: Self-pay | Admitting: *Deleted

## 2015-05-11 ENCOUNTER — Ambulatory Visit (HOSPITAL_BASED_OUTPATIENT_CLINIC_OR_DEPARTMENT_OTHER): Payer: Medicare Other | Admitting: Oncology

## 2015-05-11 VITALS — BP 201/96 | HR 85 | Temp 98.3°F | Resp 18 | Ht 61.0 in | Wt 176.4 lb

## 2015-05-11 DIAGNOSIS — C61 Malignant neoplasm of prostate: Secondary | ICD-10-CM

## 2015-05-11 MED ORDER — PREDNISONE 5 MG PO TABS: ORAL_TABLET | ORAL | Status: DC

## 2015-05-11 MED ORDER — HYDROCODONE-ACETAMINOPHEN 5-325MG PREPACK (~~LOC~~: ORAL_TABLET | ORAL | Status: DC

## 2015-05-11 MED ORDER — ABIRATERONE ACETATE 250 MG PO TABS: 1000.0000 mg | ORAL_TABLET | Freq: Every day | ORAL | Status: DC
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
## 2015-05-14 ENCOUNTER — Other Ambulatory Visit: Payer: Self-pay | Admitting: *Deleted

## 2015-05-14 ENCOUNTER — Encounter: Payer: Self-pay | Admitting: *Deleted

## 2015-05-14 DIAGNOSIS — C61 Malignant neoplasm of prostate: Secondary | ICD-10-CM

## 2015-05-14 MED ORDER — ABIRATERONE ACETATE 250 MG PO TABS: 1000.0000 mg | ORAL_TABLET | Freq: Every day | ORAL | Status: DC

## 2015-06-06 DIAGNOSIS — E785 Hyperlipidemia, unspecified: Secondary | ICD-10-CM | POA: Diagnosis not present

## 2015-06-06 DIAGNOSIS — Z23 Encounter for immunization: Secondary | ICD-10-CM | POA: Diagnosis not present

## 2015-06-06 DIAGNOSIS — G2581 Restless legs syndrome: Secondary | ICD-10-CM | POA: Diagnosis not present

## 2015-06-06 DIAGNOSIS — I10 Essential (primary) hypertension: Secondary | ICD-10-CM | POA: Diagnosis not present

## 2015-06-06 DIAGNOSIS — E538 Deficiency of other specified B group vitamins: Secondary | ICD-10-CM | POA: Diagnosis not present

## 2015-06-06 DIAGNOSIS — G629 Polyneuropathy, unspecified: Secondary | ICD-10-CM | POA: Diagnosis not present

## 2015-06-14 ENCOUNTER — Ambulatory Visit (HOSPITAL_BASED_OUTPATIENT_CLINIC_OR_DEPARTMENT_OTHER): Payer: Medicare Other | Admitting: Oncology

## 2015-06-14 ENCOUNTER — Ambulatory Visit: Payer: Medicare Other | Admitting: Hematology

## 2015-06-14 ENCOUNTER — Telehealth: Payer: Self-pay | Admitting: Oncology

## 2015-06-14 ENCOUNTER — Ambulatory Visit (HOSPITAL_BASED_OUTPATIENT_CLINIC_OR_DEPARTMENT_OTHER): Payer: Medicare Other

## 2015-06-14 ENCOUNTER — Other Ambulatory Visit (HOSPITAL_BASED_OUTPATIENT_CLINIC_OR_DEPARTMENT_OTHER): Payer: Medicare Other

## 2015-06-14 VITALS — BP 157/79 | HR 64 | Temp 98.0°F | Resp 18 | Ht 61.0 in | Wt 174.6 lb

## 2015-06-14 DIAGNOSIS — C61 Malignant neoplasm of prostate: Secondary | ICD-10-CM | POA: Diagnosis not present

## 2015-06-14 DIAGNOSIS — C7951 Secondary malignant neoplasm of bone: Secondary | ICD-10-CM

## 2015-06-14 DIAGNOSIS — Z5111 Encounter for antineoplastic chemotherapy: Secondary | ICD-10-CM

## 2015-06-14 LAB — COMPREHENSIVE METABOLIC PANEL
ALT: 11 U/L (ref 0–55)
ANION GAP: 12 meq/L — AB (ref 3–11)
AST: 18 U/L (ref 5–34)
Albumin: 3.4 g/dL — ABNORMAL LOW (ref 3.5–5.0)
Alkaline Phosphatase: 85 U/L (ref 40–150)
BUN: 21.1 mg/dL (ref 7.0–26.0)
CHLORIDE: 105 meq/L (ref 98–109)
CO2: 27 meq/L (ref 22–29)
Calcium: 9.7 mg/dL (ref 8.4–10.4)
Creatinine: 0.9 mg/dL (ref 0.7–1.3)
Glucose: 89 mg/dl (ref 70–140)
POTASSIUM: 3.8 meq/L (ref 3.5–5.1)
Sodium: 143 mEq/L (ref 136–145)
Total Bilirubin: <0.3 mg/dL (ref 0.20–1.20)
Total Protein: 7.1 g/dL (ref 6.4–8.3)

## 2015-06-14 LAB — CBC WITH DIFFERENTIAL/PLATELET
BASO%: 0.5 % (ref 0.0–2.0)
Basophils Absolute: 0.1 10*3/uL (ref 0.0–0.1)
EOS%: 1.2 % (ref 0.0–7.0)
Eosinophils Absolute: 0.1 10*3/uL (ref 0.0–0.5)
HCT: 39.8 % (ref 38.4–49.9)
HGB: 12.5 g/dL — ABNORMAL LOW (ref 13.0–17.1)
LYMPH%: 28.1 % (ref 14.0–49.0)
MCH: 26 pg — ABNORMAL LOW (ref 27.2–33.4)
MCHC: 31.3 g/dL — AB (ref 32.0–36.0)
MCV: 82.9 fL (ref 79.3–98.0)
MONO#: 0.8 10*3/uL (ref 0.1–0.9)
MONO%: 7.3 % (ref 0.0–14.0)
NEUT#: 7.3 10*3/uL — ABNORMAL HIGH (ref 1.5–6.5)
NEUT%: 62.9 % (ref 39.0–75.0)
PLATELETS: 226 10*3/uL (ref 140–400)
RBC: 4.8 10*6/uL (ref 4.20–5.82)
RDW: 14.6 % (ref 11.0–14.6)
WBC: 11.5 10*3/uL — ABNORMAL HIGH (ref 4.0–10.3)
lymph#: 3.2 10*3/uL (ref 0.9–3.3)

## 2015-06-14 MED ORDER — DENOSUMAB 120 MG/1.7ML ~~LOC~~ SOLN
120.0000 mg | Freq: Once | SUBCUTANEOUS | Status: AC
  Administered 2015-06-14: 120 mg via SUBCUTANEOUS
  Filled 2015-06-14: qty 1.7

## 2015-06-14 MED ORDER — LEUPROLIDE ACETATE (4 MONTH) 30 MG IM KIT
30.0000 mg | PACK | Freq: Once | INTRAMUSCULAR | Status: AC
  Administered 2015-06-14: 30 mg via INTRAMUSCULAR
  Filled 2015-06-14: qty 30
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
## 2015-06-14 NOTE — Addendum Note (Signed)
Addended by: Randolm Idol on: 06/14/2015 03:57 PM   Modules accepted: Medications

## 2015-06-14 NOTE — Telephone Encounter (Signed)
Gave pt appt for 07/18/15.

## 2015-06-14 NOTE — Progress Notes (Signed)
Financial household income listed and left for chris elder pharmacist, on his desk, in a sealed envelope

## 2015-06-15 LAB — PSA: PSA: 17.83 ng/mL — AB (ref <=4.00)

## 2015-07-18 ENCOUNTER — Other Ambulatory Visit (HOSPITAL_BASED_OUTPATIENT_CLINIC_OR_DEPARTMENT_OTHER): Payer: Medicare Other

## 2015-07-18 ENCOUNTER — Ambulatory Visit (HOSPITAL_BASED_OUTPATIENT_CLINIC_OR_DEPARTMENT_OTHER): Payer: Medicare Other | Admitting: Oncology

## 2015-07-18 ENCOUNTER — Telehealth: Payer: Self-pay | Admitting: Oncology

## 2015-07-18 ENCOUNTER — Ambulatory Visit (HOSPITAL_BASED_OUTPATIENT_CLINIC_OR_DEPARTMENT_OTHER): Payer: Medicare Other

## 2015-07-18 VITALS — BP 144/77 | HR 66 | Temp 98.2°F | Resp 18 | Ht 61.0 in | Wt 174.7 lb

## 2015-07-18 DIAGNOSIS — M25559 Pain in unspecified hip: Secondary | ICD-10-CM

## 2015-07-18 DIAGNOSIS — M545 Low back pain: Secondary | ICD-10-CM

## 2015-07-18 DIAGNOSIS — C7951 Secondary malignant neoplasm of bone: Secondary | ICD-10-CM | POA: Diagnosis not present

## 2015-07-18 DIAGNOSIS — C61 Malignant neoplasm of prostate: Secondary | ICD-10-CM

## 2015-07-18 DIAGNOSIS — E291 Testicular hypofunction: Secondary | ICD-10-CM

## 2015-07-18 LAB — COMPREHENSIVE METABOLIC PANEL
ALT: 11 U/L (ref 0–55)
ANION GAP: 8 meq/L (ref 3–11)
AST: 21 U/L (ref 5–34)
Albumin: 3.5 g/dL (ref 3.5–5.0)
Alkaline Phosphatase: 82 U/L (ref 40–150)
BILIRUBIN TOTAL: 0.47 mg/dL (ref 0.20–1.20)
BUN: 11.6 mg/dL (ref 7.0–26.0)
CHLORIDE: 105 meq/L (ref 98–109)
CO2: 29 meq/L (ref 22–29)
Calcium: 8.8 mg/dL (ref 8.4–10.4)
Creatinine: 0.8 mg/dL (ref 0.7–1.3)
Glucose: 101 mg/dl (ref 70–140)
POTASSIUM: 3.7 meq/L (ref 3.5–5.1)
Sodium: 142 mEq/L (ref 136–145)
Total Protein: 7.1 g/dL (ref 6.4–8.3)

## 2015-07-18 LAB — CBC WITH DIFFERENTIAL/PLATELET
BASO%: 0.1 % (ref 0.0–2.0)
Basophils Absolute: 0 10*3/uL (ref 0.0–0.1)
EOS%: 0.2 % (ref 0.0–7.0)
Eosinophils Absolute: 0 10*3/uL (ref 0.0–0.5)
HCT: 39.5 % (ref 38.4–49.9)
HGB: 12.7 g/dL — ABNORMAL LOW (ref 13.0–17.1)
LYMPH%: 19.2 % (ref 14.0–49.0)
MCH: 26.7 pg — AB (ref 27.2–33.4)
MCHC: 32.2 g/dL (ref 32.0–36.0)
MCV: 83.2 fL (ref 79.3–98.0)
MONO#: 0.3 10*3/uL (ref 0.1–0.9)
MONO%: 3.4 % (ref 0.0–14.0)
NEUT#: 6.3 10*3/uL (ref 1.5–6.5)
NEUT%: 77.1 % — AB (ref 39.0–75.0)
PLATELETS: 247 10*3/uL (ref 140–400)
RBC: 4.75 10*6/uL (ref 4.20–5.82)
RDW: 14.3 % (ref 11.0–14.6)
WBC: 8.2 10*3/uL (ref 4.0–10.3)
lymph#: 1.6 10*3/uL (ref 0.9–3.3)

## 2015-07-18 MED ORDER — DENOSUMAB 120 MG/1.7ML ~~LOC~~ SOLN
120.0000 mg | Freq: Once | SUBCUTANEOUS | Status: AC
  Administered 2015-07-18: 120 mg via SUBCUTANEOUS
  Filled 2015-07-18: qty 1.7

## 2015-07-18 NOTE — Telephone Encounter (Signed)
per piof to sch pt appt-gave pt copy of avs °

## 2015-07-18 NOTE — Progress Notes (Signed)
Hematology and Oncology Follow Up Visit  [name] Mathews [id number] 21-Sep-1945 70 y.o. 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 He was inially diagnosed  2001, gleason score 4+5= 9.  Prior Therapy: 1. 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 He was started on hormone therapy, initially with Lupron.  2. Due to a rise in his PSA, Casodex was added to firmagon in 2009.  3. In 08/3011, his PSA was up to 16, with castrate level testosterone and bone involvement based on a bone scan on 08/2011. PSA eventually went up to 33.  Current therapy:  Zytiga 1000 mg po daily started in 09/2011 with prednisone 5 mg daily. Xgeva 120 mg started on 10/31/2011. This is given monthly.  Lupron 30 mg subcutaneously to be resumed on 06/14/2015 and every 4 months after that.  Interim History: [name] Mathews presents today for a follow up visit. Since the last visit, he continues to do very well without any recent complaints. He is no longer reporting any back pain or bone pain. He is ambulating without any difficulties and have not reported any decline in his energy her performance status. His quality of life and performance status remains excellent.  He continues to take as Zytiga without any complications. He has not reported any GI complications such as nausea or abdominal pain. Has not reported any change in his bowel habits. He does not report any frequency or hematuria. Bowel habits are regular.    He denies fevers, chills, nausea, vomiting, or changes in bowel or bladder habits. He is eating and drinking well, and denies unexplained weight loss. His energy level is good during the day, and he sleeps well at night. He denies neurological deficits or lower extremity weakness. He has  no shortness of breath, chest pain, cough, swelling, or palpitations. He has no headaches, dizziness, or blurred vision. The remainder of his detailed review of systems is otherwise unremarkable  Medications: I have reviewed the patient's current medications. Current Outpatient Prescriptions  Medication Sig Dispense Refill  . abiraterone Acetate (ZYTIGA) 250 MG tablet Take 4 tablets (1,000 mg total) by mouth daily. Take on an empty stomach 1 hour before or 2 hours after a meal 120 tablet 0  . atorvastatin (LIPITOR) 10 MG tablet Take 10 mg by mouth daily.    . Calcium Carbonate (CALTRATE 600 PO) Take 600 mg by mouth 3 (three) times daily.    [name] denosumab (XGEVA) 120 MG/1.7ML SOLN Inject 120 mg into the skin every 30 (thirty) days.    . DULoxetine (CYMBALTA) 60 MG capsule Take 60 mg by mouth daily.  2  . EPIPEN 2-PAK 0.3 MG/0.3ML SOAJ injection     . fish oil-omega-3 fatty acids 1000 MG capsule Take 1 g by mouth daily.    [name] HYDROcodone-acetaminophen (VICODIN) 5-325 mg TABS tablet Take one tablet by mouth every 6 hours as needed. 30 tablet 0  . predniSONE (DELTASONE) 5 MG tablet TAKE 1 TABLET (5 MG TOTAL) BY MOUTH 2 (TWO) TIMES DAILY. 60 tablet 1  . rizatriptan (MAXALT-MLT) 10 MG disintegrating tablet Take 10 mg by mouth as needed.     [name] rOPINIRole (REQUIP) 2 MG tablet Take 2 mg by mouth 2 (two) times daily.    . vitamin B-12 (CYANOCOBALAMIN) 1000 MCG tablet  Take 1,000 mcg by mouth daily.     No current facility-administered medications for this visit.     Allergies:  Allergies  Allergen Reactions  . Percocet [Oxycodone-Acetaminophen] Palpitations    His past medical history was reviewed today and is unchanged.    Physical Exam: Blood pressure 144/77, pulse 66, temperature 98.2 F (36.8 C), temperature source Oral, resp. rate 18, height 5\' 1"  (1.549 m), weight 174 lb 11.2 oz (79.243 kg), SpO2 97 %.  ECOG: 0  General appearance: alert gentleman without distress. Head: Normocephalic,  without obvious abnormality no thrush. Neck: no adenopathy thyroid masses. Lymph nodes: Cervical, supraclavicular, and axillary nodes normal. Heart:regular rate and rhythm, S1, S2 normal, no murmur, click, rub or gallop Lung:chest clear, no wheezing, rales, normal symmetric air entry Abdomen: soft, non-tender, without masses or organomegaly no shifting dullness or ascites. Back examination: No skin rashes or lesions.  EXT:no erythema, induration, or nodules Neurological examination: No motor or sensory deficits.   Lab Results: Lab Results  Component Value Date   WBC 8.2 07/18/2015   HGB 12.7* 07/18/2015   HCT 39.5 07/18/2015   MCV 83.2 07/18/2015   PLT 247 07/18/2015     Chemistry      Component Value Date/Time   NA 143 06/14/2015 1507   NA 143 01/14/2012 0935   K 3.8 06/14/2015 1507   K 4.4 01/14/2012 0935   CL 105 12/08/2012 1504   CL 107 01/14/2012 0935   CO2 27 06/14/2015 1507   CO2 29 01/14/2012 0935   BUN 21.1 06/14/2015 1507   BUN 11 01/14/2012 0935   CREATININE 0.9 06/14/2015 1507   CREATININE 0.83 01/14/2012 0935      Component Value Date/Time   CALCIUM 9.7 06/14/2015 1507   CALCIUM 9.5 01/14/2012 0935   ALKPHOS 85 06/14/2015 1507   ALKPHOS 87 01/14/2012 0935   AST 18 06/14/2015 1507   AST 18 01/14/2012 0935   ALT 11 06/14/2015 1507   ALT 14 01/14/2012 0935   BILITOT <0.30 06/14/2015 1507   BILITOT 0.7 01/14/2012 0935      Results for [name] Mathews, [name] Mathews (MRN [id number]) as of 07/18/2015 15:36  Ref. Range 03/02/2015 14:45 05/02/2015 10:32 06/14/2015 15:07  PSA Latest Ref Range: <=4.00 ng/mL 19.51 (H) 11.64 (H) 17.83 (H)    Impression and Plan:   70 year old gentleman with the following issues:    1. 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 He has an elevated PSA up to 33 in May of 2013.   He is currently on Zytiga and have tolerated it well. Staging workup from November 2016 including CT  scan and a bone scan did not show any major changes in his metastatic disease.   If his PSA have been fluctuating but overall have been slowly increasing. He understands that different salvage therapy will be used in the near future upon symptomatic progression. For the time being, given his excellent quality of life on this medication he would like to continue. If his PSA start rising rapidly, we'll switch him to [place] at that time.  2. Bone directed therapy. On Xgeva monthly with calcium supplementation. I continued to educate him about complications such as osteonecrosis of the jaw. This will be resumed in December 2016 and on a monthly basis.  3. Androgen deprivation: he received his first Lupron in December 2016 and will be repeated every 4 months.  4. Lower back pain/ hip pain: Longer complaints of back  pain at this time. We'll continue to monitor moving forward.  5. Followup: in 4 weeks to follow-up on his clinical status.   Zola Button, MD 1/25/20173:49 PM

## 2015-07-19 LAB — PSA (PARALLEL TESTING): PSA: 12.04 ng/mL — AB (ref <=4.00)

## 2015-07-19 LAB — PSA: PROSTATE SPECIFIC AG, SERUM: 12.5 ng/mL — AB (ref 0.0–4.0)

## 2015-07-26 DIAGNOSIS — G210 Malignant neuroleptic syndrome: Secondary | ICD-10-CM | POA: Diagnosis not present

## 2015-07-26 DIAGNOSIS — M5415 Radiculopathy, thoracolumbar region: Secondary | ICD-10-CM | POA: Diagnosis not present

## 2015-07-26 DIAGNOSIS — G2581 Restless legs syndrome: Secondary | ICD-10-CM | POA: Diagnosis not present

## 2015-07-26 DIAGNOSIS — G609 Hereditary and idiopathic neuropathy, unspecified: Secondary | ICD-10-CM | POA: Diagnosis not present

## 2015-07-27 ENCOUNTER — Encounter: Payer: Self-pay | Admitting: *Deleted

## 2015-08-01 ENCOUNTER — Other Ambulatory Visit: Payer: Self-pay | Admitting: *Deleted

## 2015-08-01 DIAGNOSIS — C61 Malignant neoplasm of prostate: Secondary | ICD-10-CM

## 2015-08-01 MED ORDER — ABIRATERONE ACETATE 250 MG PO TABS: 1000.0000 mg | ORAL_TABLET | Freq: Every day | ORAL | Status: DC

## 2015-08-01 NOTE — Telephone Encounter (Signed)
Prescription for Zytiga printed and given to Raquel in [place] to send to [facility] and [place].

## 2015-08-21 DIAGNOSIS — Z8546 Personal history of malignant neoplasm of prostate: Secondary | ICD-10-CM | POA: Diagnosis not present

## 2015-08-21 DIAGNOSIS — M4802 Spinal stenosis, cervical region: Secondary | ICD-10-CM | POA: Diagnosis not present

## 2015-08-21 DIAGNOSIS — M5412 Radiculopathy, cervical region: Secondary | ICD-10-CM | POA: Diagnosis not present

## 2015-08-21 DIAGNOSIS — M2578 Osteophyte, vertebrae: Secondary | ICD-10-CM | POA: Diagnosis not present

## 2015-08-23 ENCOUNTER — Other Ambulatory Visit (HOSPITAL_BASED_OUTPATIENT_CLINIC_OR_DEPARTMENT_OTHER): Payer: Medicare Other

## 2015-08-23 ENCOUNTER — Ambulatory Visit (HOSPITAL_BASED_OUTPATIENT_CLINIC_OR_DEPARTMENT_OTHER): Payer: Medicare Other

## 2015-08-23 ENCOUNTER — Telehealth: Payer: Self-pay | Admitting: Oncology

## 2015-08-23 ENCOUNTER — Ambulatory Visit (HOSPITAL_BASED_OUTPATIENT_CLINIC_OR_DEPARTMENT_OTHER): Payer: Medicare Other | Admitting: Oncology

## 2015-08-23 VITALS — BP 155/79 | HR 65 | Temp 98.4°F | Resp 18 | Ht 61.0 in | Wt 175.1 lb

## 2015-08-23 DIAGNOSIS — C7951 Secondary malignant neoplasm of bone: Secondary | ICD-10-CM

## 2015-08-23 DIAGNOSIS — E291 Testicular hypofunction: Secondary | ICD-10-CM | POA: Diagnosis not present

## 2015-08-23 DIAGNOSIS — C61 Malignant neoplasm of prostate: Secondary | ICD-10-CM

## 2015-08-23 LAB — COMPREHENSIVE METABOLIC PANEL
ALT: 10 U/L (ref 0–55)
ANION GAP: 8 meq/L (ref 3–11)
AST: 18 U/L (ref 5–34)
Albumin: 3.3 g/dL — ABNORMAL LOW (ref 3.5–5.0)
Alkaline Phosphatase: 93 U/L (ref 40–150)
BUN: 13.9 mg/dL (ref 7.0–26.0)
CHLORIDE: 106 meq/L (ref 98–109)
CO2: 31 meq/L — AB (ref 22–29)
Calcium: 9.5 mg/dL (ref 8.4–10.4)
Creatinine: 0.9 mg/dL (ref 0.7–1.3)
GLUCOSE: 113 mg/dL (ref 70–140)
POTASSIUM: 4.6 meq/L (ref 3.5–5.1)
SODIUM: 145 meq/L (ref 136–145)
Total Protein: 6.9 g/dL (ref 6.4–8.3)

## 2015-08-23 LAB — CBC WITH DIFFERENTIAL/PLATELET
BASO%: 0.3 % (ref 0.0–2.0)
Basophils Absolute: 0 10*3/uL (ref 0.0–0.1)
EOS ABS: 0 10*3/uL (ref 0.0–0.5)
EOS%: 0.5 % (ref 0.0–7.0)
HCT: 38.5 % (ref 38.4–49.9)
HEMOGLOBIN: 12.3 g/dL — AB (ref 13.0–17.1)
LYMPH%: 24.8 % (ref 14.0–49.0)
MCH: 25.9 pg — ABNORMAL LOW (ref 27.2–33.4)
MCHC: 31.8 g/dL — AB (ref 32.0–36.0)
MCV: 81.3 fL (ref 79.3–98.0)
MONO#: 0.7 10*3/uL (ref 0.1–0.9)
MONO%: 7.9 % (ref 0.0–14.0)
NEUT%: 66.5 % (ref 39.0–75.0)
NEUTROS ABS: 5.7 10*3/uL (ref 1.5–6.5)
Platelets: 244 10*3/uL (ref 140–400)
RBC: 4.74 10*6/uL (ref 4.20–5.82)
RDW: 14.5 % (ref 11.0–14.6)
WBC: 8.6 10*3/uL (ref 4.0–10.3)
lymph#: 2.1 10*3/uL (ref 0.9–3.3)

## 2015-08-23 MED ORDER — DENOSUMAB 120 MG/1.7ML ~~LOC~~ SOLN
120.0000 mg | Freq: Once | SUBCUTANEOUS | Status: AC
  Administered 2015-08-23: 120 mg via SUBCUTANEOUS
  Filled 2015-08-23: qty 1.7

## 2015-08-23 MED ORDER — PREDNISONE 5 MG PO TABS: ORAL_TABLET | ORAL | Status: DC

## 2015-08-23 NOTE — Telephone Encounter (Signed)
Gave and printed appt sched and avs fo rpt for march °

## 2015-08-23 NOTE — Progress Notes (Signed)
Hematology and Oncology Follow Up Visit  [name] Mathews [id number] 1946/05/22 70 y.o. 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 He was inially diagnosed  2001, gleason score 4+5= 9.  Prior Therapy: 1. 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 He was started on hormone therapy, initially with Lupron.  2. Due to a rise in his PSA, Casodex was added to firmagon in 2009.  3. In 08/3011, his PSA was up to 16, with castrate level testosterone and bone involvement based on a bone scan on 08/2011. PSA eventually went up to 33.  Current therapy:  Zytiga 1000 mg po daily started in 09/2011 with prednisone 5 mg daily. Xgeva 120 mg started on 10/31/2011. This is given monthly.  Lupron 30 mg subcutaneously to be resumed on 06/14/2015 and every 4 months after that.  Interim History: [name] Mathews presents today for a follow up visit. Since the last visit, he reports continues to feel well. He has had intermittent lower back pain which have resolved at this time. The pain does not interfere with his function and not associated with any neurological deficits. He is not reporting any discomfort or bone pain at this time. He is ambulating without any difficulties and have not reported any decline in his energy her performance status. His quality of life and performance status remains excellent. He continues to drive without any complications.  He continues to take as Zytiga without any complications. He has not reported any GI complications such as nausea or abdominal pain. Has not reported any change in his bowel habits. He does not report any frequency or hematuria. He does not report any complications related to prednisone such as weight gain or insomnia.    He denies fevers,  chills, nausea, vomiting, or changes in bowel or bladder habits. He is eating and drinking well, and denies unexplained weight loss. His energy level is good during the day, and he sleeps well at night. He denies neurological deficits or lower extremity weakness. He has no shortness of breath, chest pain, cough, swelling, or palpitations. He has no headaches, dizziness, or blurred vision. The remainder of his detailed review of systems is otherwise unremarkable  Medications: I have reviewed the patient's current medications. Current Outpatient Prescriptions  Medication Sig Dispense Refill  . abiraterone Acetate (ZYTIGA) 250 MG tablet Take 4 tablets (1,000 mg total) by mouth daily. Take on an empty stomach 1 hour before or 2 hours after a meal 120 tablet 0  . atorvastatin (LIPITOR) 10 MG tablet Take 10 mg by mouth daily.    . Calcium Carbonate (CALTRATE 600 PO) Take 600 mg by mouth 3 (three) times daily.    [name] denosumab (XGEVA) 120 MG/1.7ML SOLN Inject 120 mg into the skin every 30 (thirty) days.    . DULoxetine (CYMBALTA) 60 MG capsule Take 60 mg by mouth daily.  2  . EPIPEN 2-PAK 0.3 MG/0.3ML SOAJ injection     . fish oil-omega-3 fatty acids 1000 MG capsule Take 1 g by mouth daily.    [name] HYDROcodone-acetaminophen (VICODIN) 5-325 mg TABS tablet Take one tablet by mouth every 6 hours as needed. 30 tablet 0  . predniSONE (DELTASONE) 5 MG tablet TAKE 1 TABLET (5 MG TOTAL) BY MOUTH 2 (TWO) TIMES DAILY. 60 tablet 1  .  rizatriptan (MAXALT-MLT) 10 MG disintegrating tablet Take 10 mg by mouth as needed.     [name] rOPINIRole (REQUIP) 2 MG tablet Take 2 mg by mouth 2 (two) times daily.    . vitamin B-12 (CYANOCOBALAMIN) 1000 MCG tablet Take 1,000 mcg by mouth daily.     No current facility-administered medications for this visit.     Allergies:  Allergies  Allergen Reactions  . Percocet [Oxycodone-Acetaminophen] Palpitations    His past medical history was reviewed today and is unchanged.    Physical  Exam: Blood pressure 155/79, pulse 65, temperature 98.4 F (36.9 C), temperature source Oral, resp. rate 18, height 5\' 1"  (1.549 m), weight 175 lb 1.6 oz (79.425 kg), SpO2 97 %.  ECOG: 0  General appearance: alert gentleman appears younger than stated age.  Head: Normocephalic, without obvious abnormality no oral ulcers.  Neck: no adenopathy thyroid masses. Lymph nodes: Cervical, supraclavicular, and axillary nodes normal. Heart:regular rate and rhythm, S1, S2 normal, no murmur, click, rub or gallop Lung:chest clear, no wheezing, rales, normal symmetric air entry Abdomen: soft, non-tender, without masses or organomegaly no ascites. EXT:no erythema, induration, or nodules Neurological examination: No motor or sensory deficits.   Lab Results: Lab Results  Component Value Date   WBC 8.6 08/23/2015   HGB 12.3* 08/23/2015   HCT 38.5 08/23/2015   MCV 81.3 08/23/2015   PLT 244 08/23/2015     Chemistry      Component Value Date/Time   NA 142 07/18/2015 1521   NA 143 01/14/2012 0935   K 3.7 07/18/2015 1521   K 4.4 01/14/2012 0935   CL 105 12/08/2012 1504   CL 107 01/14/2012 0935   CO2 29 07/18/2015 1521   CO2 29 01/14/2012 0935   BUN 11.6 07/18/2015 1521   BUN 11 01/14/2012 0935   CREATININE 0.8 07/18/2015 1521   CREATININE 0.83 01/14/2012 0935      Component Value Date/Time   CALCIUM 8.8 07/18/2015 1521   CALCIUM 9.5 01/14/2012 0935   ALKPHOS 82 07/18/2015 1521   ALKPHOS 87 01/14/2012 0935   AST 21 07/18/2015 1521   AST 18 01/14/2012 0935   ALT 11 07/18/2015 1521   ALT 14 01/14/2012 0935   BILITOT 0.47 07/18/2015 1521   BILITOT 0.7 01/14/2012 0935      Results for ORREN, TIMBLIN (MRN [id number]) as of 08/23/2015 15:53  Ref. Range 06/14/2015 15:07 07/18/2015 15:21 07/18/2015 15:21  PSA Latest Ref Range: 0.0-4.0 ng/mL 17.83 (H) 12.04 (H) 12.5 (H)    Impression and Plan:   70 year old gentleman with the following issues:    1. Advanced prostate cancer. 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 He has an elevated PSA up to 33 in May of 2013.   He is currently on Zytiga and have tolerated it well. Staging workup from November 2016 including CT scan and a bone scan did not show any major changes in his metastatic disease.   If his PSA continues to be under excellent control and his quality of life remains excellent. The plan is to continue the same dose and schedule and use different salvage therapy upon symptomatic progression.  2. Bone directed therapy. On Xgeva monthly with calcium supplementation. I continued to educate him about complications such as osteonecrosis of the jaw. This will be given today and on a monthly basis.  3. Androgen deprivation: he received his first Lupron in December 2016 and will be repeated every 4 months.  4.  Lower back pain/ hip pain: Continues to be intermittent in nature and not have dramatically changed. We will continue to monitor this on future visits.  5. Followup: in 4 weeks to follow-up.    Zola Button, MD 3/2/20174:13 PM

## 2015-08-24 LAB — PSA: Prostate Specific Ag, Serum: 14.3 ng/mL — ABNORMAL HIGH (ref 0.0–4.0)

## 2015-08-24 LAB — PSA (PARALLEL TESTING): PSA: 14.79 ng/mL — ABNORMAL HIGH (ref <=4.00)

## 2015-08-30 DIAGNOSIS — G210 Malignant neuroleptic syndrome: Secondary | ICD-10-CM | POA: Diagnosis not present

## 2015-08-30 DIAGNOSIS — M5415 Radiculopathy, thoracolumbar region: Secondary | ICD-10-CM | POA: Diagnosis not present

## 2015-08-30 DIAGNOSIS — G609 Hereditary and idiopathic neuropathy, unspecified: Secondary | ICD-10-CM | POA: Diagnosis not present

## 2015-08-30 DIAGNOSIS — G2581 Restless legs syndrome: Secondary | ICD-10-CM | POA: Diagnosis not present

## 2015-09-04 ENCOUNTER — Other Ambulatory Visit: Payer: Self-pay | Admitting: *Deleted

## 2015-09-04 ENCOUNTER — Encounter: Payer: Self-pay | Admitting: *Deleted

## 2015-09-04 DIAGNOSIS — C61 Malignant neoplasm of prostate: Secondary | ICD-10-CM

## 2015-09-04 MED ORDER — ABIRATERONE ACETATE 250 MG PO TABS: 1000.0000 mg | ORAL_TABLET | Freq: Every day | ORAL | Status: DC

## 2015-09-21 ENCOUNTER — Ambulatory Visit (HOSPITAL_BASED_OUTPATIENT_CLINIC_OR_DEPARTMENT_OTHER): Payer: Medicare Other | Admitting: Oncology

## 2015-09-21 ENCOUNTER — Other Ambulatory Visit (HOSPITAL_BASED_OUTPATIENT_CLINIC_OR_DEPARTMENT_OTHER): Payer: Medicare Other

## 2015-09-21 ENCOUNTER — Telehealth: Payer: Self-pay | Admitting: Oncology

## 2015-09-21 ENCOUNTER — Ambulatory Visit: Payer: Medicare Other

## 2015-09-21 VITALS — BP 160/74 | HR 68 | Temp 98.4°F | Resp 18 | Ht 61.0 in | Wt 176.6 lb

## 2015-09-21 DIAGNOSIS — Z5111 Encounter for antineoplastic chemotherapy: Secondary | ICD-10-CM

## 2015-09-21 DIAGNOSIS — C61 Malignant neoplasm of prostate: Secondary | ICD-10-CM

## 2015-09-21 DIAGNOSIS — E291 Testicular hypofunction: Secondary | ICD-10-CM

## 2015-09-21 DIAGNOSIS — C7951 Secondary malignant neoplasm of bone: Secondary | ICD-10-CM

## 2015-09-21 DIAGNOSIS — R351 Nocturia: Secondary | ICD-10-CM | POA: Diagnosis not present

## 2015-09-21 LAB — CBC WITH DIFFERENTIAL/PLATELET
BASO%: 0.1 % (ref 0.0–2.0)
BASOS ABS: 0 10*3/uL (ref 0.0–0.1)
EOS%: 0.4 % (ref 0.0–7.0)
Eosinophils Absolute: 0 10*3/uL (ref 0.0–0.5)
HEMATOCRIT: 37 % — AB (ref 38.4–49.9)
HGB: 11.7 g/dL — ABNORMAL LOW (ref 13.0–17.1)
LYMPH%: 19.3 % (ref 14.0–49.0)
MCH: 26.2 pg — AB (ref 27.2–33.4)
MCHC: 31.6 g/dL — AB (ref 32.0–36.0)
MCV: 83 fL (ref 79.3–98.0)
MONO#: 0.6 10*3/uL (ref 0.1–0.9)
MONO%: 6.9 % (ref 0.0–14.0)
NEUT#: 6 10*3/uL (ref 1.5–6.5)
NEUT%: 73.3 % (ref 39.0–75.0)
PLATELETS: 226 10*3/uL (ref 140–400)
RBC: 4.46 10*6/uL (ref 4.20–5.82)
RDW: 14.2 % (ref 11.0–14.6)
WBC: 8.2 10*3/uL (ref 4.0–10.3)
lymph#: 1.6 10*3/uL (ref 0.9–3.3)

## 2015-09-21 LAB — BASIC METABOLIC PANEL
ANION GAP: 6 meq/L (ref 3–11)
BUN: 10.1 mg/dL (ref 7.0–26.0)
CALCIUM: 9.1 mg/dL (ref 8.4–10.4)
CO2: 29 mEq/L (ref 22–29)
CREATININE: 0.8 mg/dL (ref 0.7–1.3)
Chloride: 105 mEq/L (ref 98–109)
EGFR: >90 mL/min/{1.73_m2} (ref >90)
GLUCOSE: 134 mg/dL (ref 70–140)
Potassium: 3.7 mEq/L (ref 3.5–5.1)
Sodium: 141 mEq/L (ref 136–145)

## 2015-09-21 MED ORDER — LEUPROLIDE ACETATE (4 MONTH) 30 MG IM KIT
30.0000 mg | PACK | Freq: Once | INTRAMUSCULAR | Status: DC
  Administered 2015-09-21: 30 mg via INTRAMUSCULAR

## 2015-09-21 MED ORDER — DENOSUMAB 120 MG/1.7ML ~~LOC~~ SOLN
120.0000 mg | Freq: Once | SUBCUTANEOUS | Status: AC
  Administered 2015-09-21: 120 mg via SUBCUTANEOUS
  Filled 2015-09-21: qty 1.7

## 2015-09-21 NOTE — Progress Notes (Signed)
Hematology and Oncology Follow Up Visit  [name] Mathews [id number] 1946/05/18 70 y.o. 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 He was inially diagnosed  2001, gleason score 4+5= 9.  Prior Therapy: 1. 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 He was started on hormone therapy, initially with Lupron.  2. Due to a rise in his PSA, Casodex was added to firmagon in 2009.  3. In 08/3011, his PSA was up to 16, with castrate level testosterone and bone involvement based on a bone scan on 08/2011. PSA eventually went up to 33.  Current therapy:  Zytiga 1000 mg po daily started in 09/2011 with prednisone 5 mg daily. Xgeva 120 mg started on 10/31/2011. This is given monthly.  Lupron 30 mg subcutaneously to be resumed on 06/14/2015 and every 4 months after that.  Interim History: Mr. Anzaldua presents today for a follow up visit. Since the last visit, he reports no recent changes in his health. He continues to have excellent performance status and quality of life. He is no longer reporting any back pain at this time. Has not reported any pathological fractures or any bone discomfort. He is ambulating without any difficulties and have not reported any decline in his energy her performance status. He reports no changes in his urine function. He does report some nocturia but no hematuria or dysuria.  He continues to take as Zytiga without any complications. He has not reported any GI complications such as nausea or abdominal pain. Has not reported any change in his bowel habits. His appetite remains excellent and have gained more weight..    He denies fevers, chills, nausea, vomiting, or changes in bowel or bladder habits. He is eating and drinking well, and denies unexplained  weight loss. His energy level is good during the day, and he sleeps well at night. He denies neurological deficits or lower extremity weakness. He has no shortness of breath, chest pain, cough, swelling, or palpitations. He has no headaches, dizziness, or blurred vision. The remainder of his detailed review of systems is otherwise unremarkable  Medications: I have reviewed the patient's current medications. Current Outpatient Prescriptions  Medication Sig Dispense Refill  . abiraterone Acetate (ZYTIGA) 250 MG tablet Take 4 tablets (1,000 mg total) by mouth daily. Take on an empty stomach 1 hour before or 2 hours after a meal 120 tablet 0  . atorvastatin (LIPITOR) 10 MG tablet Take 10 mg by mouth daily.    . Calcium Carbonate (CALTRATE 600 PO) Take 600 mg by mouth 3 (three) times daily.    [name] denosumab (XGEVA) 120 MG/1.7ML SOLN Inject 120 mg into the skin every 30 (thirty) days.    . DULoxetine (CYMBALTA) 60 MG capsule Take 60 mg by mouth daily.  2  . EPIPEN 2-PAK 0.3 MG/0.3ML SOAJ injection     . fish oil-omega-3 fatty acids 1000 MG capsule Take 1 g by mouth daily.    [name] HYDROcodone-acetaminophen (VICODIN) 5-325 mg TABS tablet Take one tablet by mouth every 6 hours as needed. 30 tablet 0  . predniSONE (DELTASONE) 5 MG tablet TAKE 1 TABLET (5 MG TOTAL) BY MOUTH 2 (TWO) TIMES DAILY. 60 tablet 1  . rizatriptan (MAXALT-MLT) 10 MG disintegrating tablet Take 10 mg by mouth as needed.     [name]  rOPINIRole (REQUIP) 2 MG tablet Take 2 mg by mouth 2 (two) times daily.    . vitamin B-12 (CYANOCOBALAMIN) 1000 MCG tablet Take 1,000 mcg by mouth daily.     No current facility-administered medications for this visit.     Allergies:  Allergies  Allergen Reactions  . Percocet [Oxycodone-Acetaminophen] Palpitations    His past medical history was reviewed today and is unchanged.    Physical Exam: Blood pressure 160/74, pulse 68, temperature 98.4 F (36.9 C), temperature source Oral, resp. rate 18, height  5\' 1"  (1.549 m), weight 176 lb 9.6 oz (80.105 kg), SpO2 98 %.  ECOG: 0  General appearance: alert gentleman without distress. Head: Normocephalic, without obvious abnormality no ulcers or lesions. Neck: no adenopathy thyroid masses. Lymph nodes: Cervical, supraclavicular, and axillary nodes normal. Heart:regular rate and rhythm, S1, S2 normal, no murmur, click, rub or gallop Lung:chest clear, no wheezing, rales, normal symmetric air entry Abdomen: soft, non-tender, without masses or organomegaly no ascites. EXT:no erythema, induration, or nodules Neurological examination: No motor or sensory deficits.   Lab Results: Lab Results  Component Value Date   WBC 8.2 09/21/2015   HGB 11.7* 09/21/2015   HCT 37.0* 09/21/2015   MCV 83.0 09/21/2015   PLT 226 09/21/2015     Chemistry      Component Value Date/Time   NA 145 08/23/2015 1538   NA 143 01/14/2012 0935   K 4.6 08/23/2015 1538   K 4.4 01/14/2012 0935   CL 105 12/08/2012 1504   CL 107 01/14/2012 0935   CO2 31* 08/23/2015 1538   CO2 29 01/14/2012 0935   BUN 13.9 08/23/2015 1538   BUN 11 01/14/2012 0935   CREATININE 0.9 08/23/2015 1538   CREATININE 0.83 01/14/2012 0935      Component Value Date/Time   CALCIUM 9.5 08/23/2015 1538   CALCIUM 9.5 01/14/2012 0935   ALKPHOS 93 08/23/2015 1538   ALKPHOS 87 01/14/2012 0935   AST 18 08/23/2015 1538   AST 18 01/14/2012 0935   ALT 10 08/23/2015 1538   ALT 14 01/14/2012 0935   BILITOT <0.30 08/23/2015 1538   BILITOT 0.7 01/14/2012 0935      Results for CHRIST, KIRN (MRN [id number]) as of 09/21/2015 15:36  Ref. Range 07/18/2015 15:21 08/23/2015 15:38 08/23/2015 15:38  PSA Latest Ref Range: 0.0-4.0 ng/mL 12.5 (H) 14.79 (H) 14.3 (H)     Impression and Plan:   70 year old gentleman with the following issues:    1. 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 He has an elevated PSA up to 33 in May of 2013.   He is  currently on Zytiga and have tolerated it well. Staging workup from November 2016 including CT scan and a bone scan did not show any major changes in his metastatic disease.   If his PSA continues to be reasonably controlled without any major changes. The plan is to continue the same dose and schedule of Zytiga and uses different salvage therapy upon symptomatic progression.  2. Bone directed therapy. On Xgeva monthly with calcium supplementation. I continued to educate him about complications such as osteonecrosis of the jaw. This will be given today and on a monthly basis.  3. Androgen deprivation: he received his first Lupron in December 2016 and will be repeated every 4 months.  4. Lower back pain/ hip pain: This have resolved at this time.  5. Followup: in 4 weeks to follow-up.    Kamori Barbier,  MD 3/31/20173:56 PM

## 2015-09-21 NOTE — Telephone Encounter (Signed)
appt made and avs will print after visit °

## 2015-09-22 LAB — PSA: PROSTATE SPECIFIC AG, SERUM: 11.2 ng/mL — AB (ref 0.0–4.0)

## 2015-10-05 ENCOUNTER — Other Ambulatory Visit: Payer: Self-pay | Admitting: *Deleted

## 2015-10-05 DIAGNOSIS — E785 Hyperlipidemia, unspecified: Secondary | ICD-10-CM | POA: Diagnosis not present

## 2015-10-05 DIAGNOSIS — I10 Essential (primary) hypertension: Secondary | ICD-10-CM | POA: Diagnosis not present

## 2015-10-05 DIAGNOSIS — G2581 Restless legs syndrome: Secondary | ICD-10-CM | POA: Diagnosis not present

## 2015-10-05 DIAGNOSIS — E538 Deficiency of other specified B group vitamins: Secondary | ICD-10-CM | POA: Diagnosis not present

## 2015-10-05 DIAGNOSIS — C61 Malignant neoplasm of prostate: Secondary | ICD-10-CM

## 2015-10-05 MED ORDER — ABIRATERONE ACETATE 250 MG PO TABS: 1000.0000 mg | ORAL_TABLET | Freq: Every day | ORAL | Status: DC

## 2015-10-26 ENCOUNTER — Ambulatory Visit (HOSPITAL_BASED_OUTPATIENT_CLINIC_OR_DEPARTMENT_OTHER): Payer: Medicare Other | Admitting: Oncology

## 2015-10-26 ENCOUNTER — Ambulatory Visit (HOSPITAL_BASED_OUTPATIENT_CLINIC_OR_DEPARTMENT_OTHER): Payer: Medicare Other

## 2015-10-26 ENCOUNTER — Telehealth: Payer: Self-pay | Admitting: Oncology

## 2015-10-26 ENCOUNTER — Other Ambulatory Visit (HOSPITAL_BASED_OUTPATIENT_CLINIC_OR_DEPARTMENT_OTHER): Payer: Medicare Other

## 2015-10-26 VITALS — BP 161/65 | HR 63 | Temp 99.1°F | Resp 18 | Ht 61.0 in | Wt 171.5 lb

## 2015-10-26 DIAGNOSIS — C61 Malignant neoplasm of prostate: Secondary | ICD-10-CM

## 2015-10-26 DIAGNOSIS — C7951 Secondary malignant neoplasm of bone: Secondary | ICD-10-CM | POA: Diagnosis not present

## 2015-10-26 DIAGNOSIS — E291 Testicular hypofunction: Secondary | ICD-10-CM

## 2015-10-26 LAB — CBC WITH DIFFERENTIAL/PLATELET
BASO%: 0.2 % (ref 0.0–2.0)
Basophils Absolute: 0 10*3/uL (ref 0.0–0.1)
EOS ABS: 0 10*3/uL (ref 0.0–0.5)
EOS%: 0.5 % (ref 0.0–7.0)
HCT: 39.8 % (ref 38.4–49.9)
HGB: 12.4 g/dL — ABNORMAL LOW (ref 13.0–17.1)
LYMPH%: 16.6 % (ref 14.0–49.0)
MCH: 25.5 pg — ABNORMAL LOW (ref 27.2–33.4)
MCHC: 31.2 g/dL — AB (ref 32.0–36.0)
MCV: 81.6 fL (ref 79.3–98.0)
MONO#: 0.5 10*3/uL (ref 0.1–0.9)
MONO%: 6.2 % (ref 0.0–14.0)
NEUT#: 6.3 10*3/uL (ref 1.5–6.5)
NEUT%: 76.5 % — AB (ref 39.0–75.0)
PLATELETS: 219 10*3/uL (ref 140–400)
RBC: 4.88 10*6/uL (ref 4.20–5.82)
RDW: 14.8 % — ABNORMAL HIGH (ref 11.0–14.6)
WBC: 8.3 10*3/uL (ref 4.0–10.3)
lymph#: 1.4 10*3/uL (ref 0.9–3.3)

## 2015-10-26 LAB — COMPREHENSIVE METABOLIC PANEL
ALT: 10 U/L (ref 0–55)
ANION GAP: 8 meq/L (ref 3–11)
AST: 19 U/L (ref 5–34)
Albumin: 3.5 g/dL (ref 3.5–5.0)
Alkaline Phosphatase: 75 U/L (ref 40–150)
BUN: 9.6 mg/dL (ref 7.0–26.0)
CHLORIDE: 106 meq/L (ref 98–109)
CO2: 28 meq/L (ref 22–29)
Calcium: 9.3 mg/dL (ref 8.4–10.4)
Creatinine: 0.8 mg/dL (ref 0.7–1.3)
Glucose: 90 mg/dl (ref 70–140)
POTASSIUM: 4.1 meq/L (ref 3.5–5.1)
SODIUM: 143 meq/L (ref 136–145)
Total Bilirubin: 0.53 mg/dL (ref 0.20–1.20)
Total Protein: 6.9 g/dL (ref 6.4–8.3)

## 2015-10-26 MED ORDER — DENOSUMAB 120 MG/1.7ML ~~LOC~~ SOLN
120.0000 mg | Freq: Once | SUBCUTANEOUS | Status: AC
  Administered 2015-10-26: 120 mg via SUBCUTANEOUS
  Filled 2015-10-26: qty 1.7

## 2015-10-26 MED ORDER — LEUPROLIDE ACETATE (4 MONTH) 30 MG IM KIT
30.0000 mg | PACK | Freq: Once | INTRAMUSCULAR | Status: DC
  Filled 2015-10-26: qty 30

## 2015-10-26 NOTE — Telephone Encounter (Signed)
Gave pt appt & avs °

## 2015-10-26 NOTE — Progress Notes (Signed)
Hematology and Oncology Follow Up Visit  [name] [id number] [birth year] 70 y.o. 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 He was inially diagnosed  2001, gleason score 4+5= 9.  Prior Therapy: 1. 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 He was started on hormone therapy, initially with Lupron.  2. Due to a rise in his PSA, Casodex was added to firmagon in 2009.  3. In 08/3011, his PSA was up to 16, with castrate level testosterone and bone involvement based on a bone scan on 08/2011. PSA eventually went up to 33.  Current therapy:  Zytiga 1000 mg po daily started in 09/2011 with prednisone 5 mg daily. Xgeva 120 mg started on 10/31/2011. This is given monthly.  Lupron 30 mg subcutaneously to be resumed on 06/14/2015 and every 4 months after that.  Interim History: [name] presents today for a follow up visit. Since the last visit, he continues to very well without any recent illnesses. He was diagnosed with degenerative disc disease of the cervical spine and has been seen by neurology. No recent intervention is needed and his symptoms appears to be manageable. He denied any neurological deficits or weakness in his arms. He continues to have excellent performance status and quality of life. He is no longer reporting any back pain at this time. Has not reported any pathological fractures or any bone discomfort. He reports no changes in his urine function. He does report some nocturia but no hematuria or dysuria.  He continues to take as Zytiga without any complications. He has not reported any GI complications such as nausea or abdominal pain. Has not reported any change in his bowel habits. He has no difficulties obtaining this medication and taking on a daily  basis.    He denies fevers, chills, nausea, vomiting, or changes in bowel or bladder habits. He is eating and drinking well, and denies unexplained weight loss. His energy level is good during the day, and he sleeps well at night. He denies neurological deficits or lower extremity weakness. He has no shortness of breath, chest pain, cough, swelling, or palpitations. He has no headaches, dizziness, or blurred vision. The remainder of his detailed review of systems is otherwise unremarkable  Medications: I have reviewed the patient's current medications. Current Outpatient Prescriptions  Medication Sig Dispense Refill  . abiraterone Acetate (ZYTIGA) 250 MG tablet Take 4 tablets (1,000 mg total) by mouth daily. Take on an empty stomach 1 hour before or 2 hours after a meal 120 tablet 0  . atorvastatin (LIPITOR) 10 MG tablet Take 10 mg by mouth daily.    . Calcium Carbonate (CALTRATE 600 PO) Take 600 mg by mouth 3 (three) times daily.    [name] denosumab (XGEVA) 120 MG/1.7ML SOLN Inject 120 mg into the skin every 30 (thirty) days.    . DULoxetine (CYMBALTA) 60 MG capsule Take 60 mg by mouth daily.  2  . EPIPEN 2-PAK 0.3 MG/0.3ML SOAJ injection     . fish oil-omega-3 fatty acids 1000 MG capsule Take 1 g by mouth daily.    [name] HYDROcodone-acetaminophen (VICODIN) 5-325 mg TABS tablet Take one tablet by mouth every 6 hours as needed. 30 tablet 0  . predniSONE (DELTASONE) 5 MG tablet TAKE 1 TABLET (5 MG TOTAL) BY MOUTH  2 (TWO) TIMES DAILY. 60 tablet 1  . rizatriptan (MAXALT-MLT) 10 MG disintegrating tablet Take 10 mg by mouth as needed.     [name] rOPINIRole (REQUIP) 2 MG tablet Take 2 mg by mouth 2 (two) times daily.    . vitamin B-12 (CYANOCOBALAMIN) 1000 MCG tablet Take 1,000 mcg by mouth daily.     No current facility-administered medications for this visit.     Allergies:  Allergies  Allergen Reactions  . Percocet [Oxycodone-Acetaminophen] Palpitations    His past medical history was reviewed today  and is unchanged.    Physical Exam: Blood pressure 161/65, pulse 63, temperature 99.1 F (37.3 C), temperature source Oral, resp. rate 18, height 5\' 1"  (1.549 m), weight 171 lb 8 oz (77.792 kg), SpO2 100 %.  ECOG: 0  General appearance: Pleasant-appearing gentleman without distress. Head: Normocephalic, without obvious abnormality no oral thrush noted. Neck: no adenopathy thyroid masses. Lymph nodes: Cervical, supraclavicular, and axillary nodes normal. Heart:regular rate and rhythm, S1, S2 normal, no murmur, click, rub or gallop Lung:chest clear, no wheezing, rales, normal symmetric air entry Abdomen: soft, non-tender, without masses or organomegaly no rebound or guarding. EXT:no erythema, induration, or nodules Neurological examination: No motor or sensory deficits.   Lab Results: Lab Results  Component Value Date   WBC 8.3 10/26/2015   HGB 12.4* 10/26/2015   HCT 39.8 10/26/2015   MCV 81.6 10/26/2015   PLT 219 10/26/2015     Chemistry      Component Value Date/Time   NA 141 09/21/2015 1523   NA 143 01/14/2012 0935   K 3.7 09/21/2015 1523   K 4.4 01/14/2012 0935   CL 105 12/08/2012 1504   CL 107 01/14/2012 0935   CO2 29 09/21/2015 1523   CO2 29 01/14/2012 0935   BUN 10.1 09/21/2015 1523   BUN 11 01/14/2012 0935   CREATININE 0.8 09/21/2015 1523   CREATININE 0.83 01/14/2012 0935      Component Value Date/Time   CALCIUM 9.1 09/21/2015 1523   CALCIUM 9.5 01/14/2012 0935   ALKPHOS 93 08/23/2015 1538   ALKPHOS 87 01/14/2012 0935   AST 18 08/23/2015 1538   AST 18 01/14/2012 0935   ALT 10 08/23/2015 1538   ALT 14 01/14/2012 0935   BILITOT <0.30 08/23/2015 1538   BILITOT 0.7 01/14/2012 0935       Results for LOUI, Mathews (MRN [id number]) as of 10/26/2015 15:20  Ref. Range 08/23/2015 15:38 09/21/2015 15:23  PSA Latest Ref Range: 0.0-4.0 ng/mL 14.3 (H) 11.2 (H)     Impression and Plan:   70 year old gentleman with the following issues:    1. Advanced prostate  cancer. 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 He has an elevated PSA up to 33 in May of 2013.   He is currently on Zytiga and have tolerated it well. Staging workup from November 2016 including CT scan and a bone scan did not show any major changes in his metastatic disease.   His PSA continues to be under excellent control and most recent PSA decreased to 11 from 14. The plan is to continue the same dose and schedule and uses different salvage therapy upon symptomatic progression. Plan to repeat staging workup in November 2017 sooner if his PSA starts to rise.  2. Bone directed therapy. On Xgeva monthly with calcium supplementation. I continued to educate him about complications such as osteonecrosis of the jaw. This will be given today and on a monthly basis.  3. Androgen deprivation: he received his first Lupron in December 2016 and will be repeated on 10/26/2015.  4. Lower back pain/ hip pain: This have resolved at this time.  5. Followup: in 4 weeks to follow-up.    Zola Button, MD 5/5/20173:25 PM

## 2015-10-26 NOTE — Patient Instructions (Addendum)
Denosumab injection What is this medicine? DENOSUMAB (den oh sue mab) slows bone breakdown. Prolia is used to treat osteoporosis in women after menopause and in men. Xgeva is used to prevent bone fractures and other bone problems caused by cancer bone metastases. Xgeva is also used to treat giant cell tumor of the bone. This medicine may be used for other purposes; ask your health care provider or pharmacist if you have questions. COMMON BRAND NAME(S): Prolia, XGEVA What should I tell my health care provider before I take this medicine? They need to know if you have any of these conditions: -dental disease -eczema -infection or history of infections -kidney disease or on dialysis -low blood calcium or vitamin D -malabsorption syndrome -scheduled to have surgery or tooth extraction -taking medicine that contains denosumab -thyroid or parathyroid disease -an unusual reaction to denosumab, other medicines, foods, dyes, or preservatives -pregnant or trying to get pregnant -breast-feeding How should I use this medicine? This medicine is for injection under the skin. It is given by a health care professional in a hospital or clinic setting. If you are getting Prolia, a special MedGuide will be given to you by the pharmacist with each prescription and refill. Be sure to read this information carefully each time. For Prolia, talk to your pediatrician regarding the use of this medicine in children. Special care may be needed. For Xgeva, talk to your pediatrician regarding the use of this medicine in children. While this drug may be prescribed for children as young as 13 years for selected conditions, precautions do apply. Overdosage: If you think you've taken too much of this medicine contact a poison control center or emergency room at once. Overdosage: If you think you have taken too much of this medicine contact a poison control center or emergency room at once. NOTE: This medicine is only for  you. Do not share this medicine with others. What if I miss a dose? It is important not to miss your dose. Call your doctor or health care professional if you are unable to keep an appointment. What may interact with this medicine? Do not take this medicine with any of the following medications: -other medicines containing denosumab This medicine may also interact with the following medications: -medicines that suppress the immune system -medicines that treat cancer -steroid medicines like prednisone or cortisone This list may not describe all possible interactions. Give your health care provider a list of all the medicines, herbs, non-prescription drugs, or dietary supplements you use. Also tell them if you smoke, drink alcohol, or use illegal drugs. Some items may interact with your medicine. What should I watch for while using this medicine? Visit your doctor or health care professional for regular checks on your progress. Your doctor or health care professional may order blood tests and other tests to see how you are doing. Call your doctor or health care professional if you get a cold or other infection while receiving this medicine. Do not treat yourself. This medicine may decrease your body's ability to fight infection. You should make sure you get enough calcium and vitamin D while you are taking this medicine, unless your doctor tells you not to. Discuss the foods you eat and the vitamins you take with your health care professional. See your dentist regularly. Brush and floss your teeth as directed. Before you have any dental work done, tell your dentist you are receiving this medicine. Do not become pregnant while taking this medicine or for 5 months after stopping   it. Women should inform their doctor if they wish to become pregnant or think they might be pregnant. There is a potential for serious side effects to an unborn child. Talk to your health care professional or pharmacist for more  information. What side effects may I notice from receiving this medicine? Side effects that you should report to your doctor or health care professional as soon as possible: -allergic reactions like skin rash, itching or hives, swelling of the face, lips, or tongue -breathing problems -chest pain -fast, irregular heartbeat -feeling faint or lightheaded, falls -fever, chills, or any other sign of infection -muscle spasms, tightening, or twitches -numbness or tingling -skin blisters or bumps, or is dry, peels, or red -slow healing or unexplained pain in the mouth or jaw -unusual bleeding or bruising Side effects that usually do not require medical attention (Report these to your doctor or health care professional if they continue or are bothersome.): -muscle pain -stomach upset, gas This list may not describe all possible side effects. Call your doctor for medical advice about side effects. You may report side effects to FDA at 1-800-FDA-1088. Where should I keep my medicine? This medicine is only given in a clinic, doctor's office, or other health care setting and will not be stored at home. NOTE: This sheet is a summary. It may not cover all possible information. If you have questions about this medicine, talk to your doctor, pharmacist, or health care provider.  2015, Elsevier/Gold Standard. (2011-12-08 12:37:47) Leuprolide depot injection What is this medicine? LEUPROLIDE (loo PROE lide) is a man-made protein that acts like a natural hormone in the body. It decreases testosterone in men and decreases estrogen in women. In men, this medicine is used to treat advanced prostate cancer. In women, some forms of this medicine may be used to treat endometriosis, uterine fibroids, or other male hormone-related problems. This medicine may be used for other purposes; ask your health care provider or pharmacist if you have questions. What should I tell my health care provider before I take this  medicine? They need to know if you have any of these conditions: -diabetes -heart disease or previous heart attack -high blood pressure -high cholesterol -osteoporosis -pain or difficulty passing urine -spinal cord metastasis -stroke -tobacco smoker -unusual vaginal bleeding (women) -an unusual or allergic reaction to leuprolide, benzyl alcohol, other medicines, foods, dyes, or preservatives -pregnant or trying to get pregnant -breast-feeding How should I use this medicine? This medicine is for injection into a muscle or for injection under the skin. It is given by a health care professional in a hospital or clinic setting. The specific product will determine how it will be given to you. Make sure you understand which product you receive and how often you will receive it. Talk to your pediatrician regarding the use of this medicine in children. Special care may be needed. Overdosage: If you think you have taken too much of this medicine contact a poison control center or emergency room at once. NOTE: This medicine is only for you. Do not share this medicine with others. What if I miss a dose? It is important not to miss a dose. Call your doctor or health care professional if you are unable to keep an appointment. Depot injections: Depot injections are given either once-monthly, every 12 weeks, every 16 weeks, or every 24 weeks depending on the product you are prescribed. The product you are prescribed will be based on if you are male or male, and your condition.  Make sure you understand your product and dosing. What may interact with this medicine? Do not take this medicine with any of the following medications: -chasteberry This medicine may also interact with the following medications: -herbal or dietary supplements, like black cohosh or DHEA -male hormones, like estrogens or progestins and birth control pills, patches, rings, or injections -male hormones, like testosterone This  list may not describe all possible interactions. Give your health care provider a list of all the medicines, herbs, non-prescription drugs, or dietary supplements you use. Also tell them if you smoke, drink alcohol, or use illegal drugs. Some items may interact with your medicine. What should I watch for while using this medicine? Visit your doctor or health care professional for regular checks on your progress. During the first weeks of treatment, your symptoms may get worse, but then will improve as you continue your treatment. You may get hot flashes, increased bone pain, increased difficulty passing urine, or an aggravation of nerve symptoms. Discuss these effects with your doctor or health care professional, some of them may improve with continued use of this medicine. Male patients may experience a menstrual cycle or spotting during the first months of therapy with this medicine. If this continues, contact your doctor or health care professional. What side effects may I notice from receiving this medicine? Side effects that you should report to your doctor or health care professional as soon as possible: -allergic reactions like skin rash, itching or hives, swelling of the face, lips, or tongue -breathing problems -chest pain -depression or memory disorders -pain in your legs or groin -pain at site where injected or implanted -severe headache -swelling of the feet and legs -visual changes -vomiting Side effects that usually do not require medical attention (report to your doctor or health care professional if they continue or are bothersome): -breast swelling or tenderness -decrease in sex drive or performance -diarrhea -hot flashes -loss of appetite -muscle, joint, or bone pains -nausea -redness or irritation at site where injected or implanted -skin problems or acne This list may not describe all possible side effects. Call your doctor for medical advice about side effects. You  may report side effects to FDA at 1-800-FDA-1088. Where should I keep my medicine? This drug is given in a hospital or clinic and will not be stored at home. NOTE: This sheet is a summary. It may not cover all possible information. If you have questions about this medicine, talk to your doctor, pharmacist, or health care provider.    2016, Elsevier/Gold Standard. (2014-03-03 14:16:23)

## 2015-10-27 LAB — PSA: PROSTATE SPECIFIC AG, SERUM: 13.8 ng/mL — AB (ref 0.0–4.0)

## 2015-10-30 DIAGNOSIS — G609 Hereditary and idiopathic neuropathy, unspecified: Secondary | ICD-10-CM | POA: Diagnosis not present

## 2015-10-30 DIAGNOSIS — M5415 Radiculopathy, thoracolumbar region: Secondary | ICD-10-CM | POA: Diagnosis not present

## 2015-10-30 DIAGNOSIS — G210 Malignant neuroleptic syndrome: Secondary | ICD-10-CM | POA: Diagnosis not present

## 2015-10-30 DIAGNOSIS — G2581 Restless legs syndrome: Secondary | ICD-10-CM | POA: Diagnosis not present

## 2015-11-07 ENCOUNTER — Other Ambulatory Visit: Payer: Self-pay | Admitting: *Deleted

## 2015-11-07 DIAGNOSIS — C61 Malignant neoplasm of prostate: Secondary | ICD-10-CM

## 2015-11-07 MED ORDER — ABIRATERONE ACETATE 250 MG PO TABS: 1000.0000 mg | ORAL_TABLET | Freq: Every day | ORAL | Status: DC

## 2015-11-29 ENCOUNTER — Telehealth: Payer: Self-pay | Admitting: Oncology

## 2015-11-29 ENCOUNTER — Other Ambulatory Visit (HOSPITAL_BASED_OUTPATIENT_CLINIC_OR_DEPARTMENT_OTHER): Payer: Medicare Other

## 2015-11-29 ENCOUNTER — Ambulatory Visit (HOSPITAL_BASED_OUTPATIENT_CLINIC_OR_DEPARTMENT_OTHER): Payer: Medicare Other

## 2015-11-29 ENCOUNTER — Ambulatory Visit (HOSPITAL_BASED_OUTPATIENT_CLINIC_OR_DEPARTMENT_OTHER): Payer: Medicare Other | Admitting: Oncology

## 2015-11-29 VITALS — BP 141/77 | HR 63 | Temp 98.0°F | Resp 18 | Ht 61.0 in | Wt 172.5 lb

## 2015-11-29 DIAGNOSIS — E291 Testicular hypofunction: Secondary | ICD-10-CM

## 2015-11-29 DIAGNOSIS — R351 Nocturia: Secondary | ICD-10-CM | POA: Diagnosis not present

## 2015-11-29 DIAGNOSIS — C7951 Secondary malignant neoplasm of bone: Secondary | ICD-10-CM

## 2015-11-29 DIAGNOSIS — C61 Malignant neoplasm of prostate: Secondary | ICD-10-CM

## 2015-11-29 LAB — CBC WITH DIFFERENTIAL/PLATELET
BASO%: 0.1 % (ref 0.0–2.0)
Basophils Absolute: 0 10*3/uL (ref 0.0–0.1)
EOS ABS: 0.2 10*3/uL (ref 0.0–0.5)
EOS%: 2.3 % (ref 0.0–7.0)
HCT: 39.3 % (ref 38.4–49.9)
HGB: 12.5 g/dL — ABNORMAL LOW (ref 13.0–17.1)
LYMPH%: 29.8 % (ref 14.0–49.0)
MCH: 26.5 pg — AB (ref 27.2–33.4)
MCHC: 31.8 g/dL — ABNORMAL LOW (ref 32.0–36.0)
MCV: 83.4 fL (ref 79.3–98.0)
MONO#: 0.6 10*3/uL (ref 0.1–0.9)
MONO%: 7 % (ref 0.0–14.0)
NEUT%: 60.8 % (ref 39.0–75.0)
NEUTROS ABS: 5.1 10*3/uL (ref 1.5–6.5)
Platelets: 201 10*3/uL (ref 140–400)
RBC: 4.71 10*6/uL (ref 4.20–5.82)
RDW: 15.3 % — ABNORMAL HIGH (ref 11.0–14.6)
WBC: 8.3 10*3/uL (ref 4.0–10.3)
lymph#: 2.5 10*3/uL (ref 0.9–3.3)

## 2015-11-29 LAB — COMPREHENSIVE METABOLIC PANEL
ALT: 12 U/L (ref 0–55)
AST: 20 U/L (ref 5–34)
Albumin: 3.2 g/dL — ABNORMAL LOW (ref 3.5–5.0)
Alkaline Phosphatase: 79 U/L (ref 40–150)
Anion Gap: 7 mEq/L (ref 3–11)
BUN: 13 mg/dL (ref 7.0–26.0)
CHLORIDE: 103 meq/L (ref 98–109)
CO2: 31 meq/L — AB (ref 22–29)
Calcium: 9.5 mg/dL (ref 8.4–10.4)
Creatinine: 0.8 mg/dL (ref 0.7–1.3)
GLUCOSE: 107 mg/dL (ref 70–140)
POTASSIUM: 3.9 meq/L (ref 3.5–5.1)
SODIUM: 141 meq/L (ref 136–145)
TOTAL PROTEIN: 6.6 g/dL (ref 6.4–8.3)
Total Bilirubin: 0.37 mg/dL (ref 0.20–1.20)

## 2015-11-29 MED ORDER — DENOSUMAB 120 MG/1.7ML ~~LOC~~ SOLN
120.0000 mg | Freq: Once | SUBCUTANEOUS | Status: AC
  Administered 2015-11-29: 120 mg via SUBCUTANEOUS
  Filled 2015-11-29: qty 1.7

## 2015-11-29 NOTE — Telephone Encounter (Signed)
gv pt updated sched with earlier times

## 2015-11-29 NOTE — Telephone Encounter (Signed)
Gave and printed appt sched and avs for pt for July  °

## 2015-11-29 NOTE — Progress Notes (Signed)
Hematology and Oncology Follow Up Visit  [name] Mathews [id number] [birth year] 70 y.o. 11/29/2015 4:01 PM    Principle Diagnosis:  70 year old gentleman with advanced prostate cancer. He has metastatic disease to the bone. He was inially diagnosed  2001, gleason score 4+5= 9.  Prior Therapy: 1. 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 He was started on hormone therapy, initially with Lupron.  2. Casodex was added to firmagon in 2009 because of PSA rise.  3. 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  Current therapy:  Zytiga 1000 mg po daily started in 09/2011 with prednisone 5 mg daily. Xgeva 120 mg started on 10/31/2011. This is given monthly.  Lupron 30 mg subcutaneously to be resumed on 06/14/2015 and every 4 months after that. Last injection given on 09/21/2015.  Interim History: [name] Mathews presents today for a follow up visit. Since the last visit, he reports no major changes in his health. He continues to take as Zytiga without any complications. He has not reported any GI complications such as nausea or abdominal pain. Has not reported any change in his bowel habits. He has no difficulties obtaining this medication and taking on a daily basis.  He reports improvement in his overall performance status and activity level. He continues to remain active and plays golf. He continues to have excellent performance status and quality of life. He is no longer reporting any back pain at this time. Has not reported any pathological fractures or any bone discomfort. He reports no changes in his urine function. He does report some nocturia but no hematuria or dysuria.  He denies fevers, chills, nausea, vomiting, or changes in bowel or bladder habits. He is eating and drinking well, and  denies unexplained weight loss. His energy level is good during the day, and he sleeps well at night. He denies neurological deficits or lower extremity weakness. He has no shortness of breath, chest pain, cough, swelling, or palpitations. He has no headaches, dizziness, or blurred vision. The remainder of his detailed review of systems is otherwise unremarkable  Medications: I have reviewed the patient's current medications. Current Outpatient Prescriptions  Medication Sig Dispense Refill  . abiraterone Acetate (ZYTIGA) 250 MG tablet Take 4 tablets (1,000 mg total) by mouth daily. Take on an empty stomach 1 hour before or 2 hours after a meal 120 tablet 0  . atorvastatin (LIPITOR) 10 MG tablet Take 10 mg by mouth daily.    . Calcium Carbonate (CALTRATE 600 PO) Take 600 mg by mouth 3 (three) times daily.    [name] denosumab (XGEVA) 120 MG/1.7ML SOLN Inject 120 mg into the skin every 30 (thirty) days.    . DULoxetine (CYMBALTA) 60 MG capsule Take 60 mg by mouth daily.  2  . EPIPEN 2-PAK 0.3 MG/0.3ML SOAJ injection     . fish oil-omega-3 fatty acids 1000 MG capsule Take 1 g by mouth daily.    . meloxicam (MOBIC) 7.5 MG tablet   1  . predniSONE (DELTASONE) 5 MG tablet TAKE 1 TABLET (5 MG TOTAL) BY MOUTH 2 (TWO) TIMES DAILY. 60 tablet 1  . rOPINIRole (REQUIP) 2 MG tablet Take 2 mg by mouth 2 (two) times daily.    . vitamin B-12 (CYANOCOBALAMIN) 1000 MCG tablet Take 1,000 mcg by mouth daily.  No current facility-administered medications for this visit.     Allergies:  Allergies  Allergen Reactions  . Percocet [Oxycodone-Acetaminophen] Palpitations    His past medical history was reviewed today and is unchanged.    Physical Exam: Blood pressure 141/77, pulse 63, temperature 98 F (36.7 C), temperature source Oral, resp. rate 18, height 5\' 1"  (1.549 m), weight 172 lb 8 oz (78.245 kg), SpO2 100 %. ECOG: 0 General appearance: Alert, awake gentleman without distress. Head: Normocephalic,  without obvious abnormality no oral ulcers or lesions. Neck: no adenopathy thyroid masses. Lymph nodes: Cervical, supraclavicular, and axillary nodes normal. Heart:regular rate and rhythm, S1, S2 normal, no murmur, click, rub or gallop Lung:chest clear, no wheezing, rales, normal symmetric air entry Abdomen: soft, non-tender, without masses or organomegaly no shifting dullness or ascites. EXT:no erythema, induration, or nodules Neurological examination: No motor or sensory deficits.   Lab Results: Lab Results  Component Value Date   WBC 8.3 11/29/2015   HGB 12.5* 11/29/2015   HCT 39.3 11/29/2015   MCV 83.4 11/29/2015   PLT 201 11/29/2015     Chemistry      Component Value Date/Time   NA 143 10/26/2015 1509   NA 143 01/14/2012 0935   K 4.1 10/26/2015 1509   K 4.4 01/14/2012 0935   CL 105 12/08/2012 1504   CL 107 01/14/2012 0935   CO2 28 10/26/2015 1509   CO2 29 01/14/2012 0935   BUN 9.6 10/26/2015 1509   BUN 11 01/14/2012 0935   CREATININE 0.8 10/26/2015 1509   CREATININE 0.83 01/14/2012 0935      Component Value Date/Time   CALCIUM 9.3 10/26/2015 1509   CALCIUM 9.5 01/14/2012 0935   ALKPHOS 75 10/26/2015 1509   ALKPHOS 87 01/14/2012 0935   AST 19 10/26/2015 1509   AST 18 01/14/2012 0935   ALT 10 10/26/2015 1509   ALT 14 01/14/2012 0935   BILITOT 0.53 10/26/2015 1509   BILITOT 0.7 01/14/2012 0935       Results for [name] Mathews (MRN [id number]) as of 11/29/2015 15:54  Ref. Range 08/23/2015 15:38 09/21/2015 15:23 10/26/2015 15:09  PSA Latest Ref Range: 0.0-4.0 ng/mL 14.3 (H) 11.2 (H) 13.8 (H)      Impression and Plan:   70 year old gentleman with the following issues:    1. 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 He has an elevated PSA up to 33 in May of 2013.   He is currently on Zytiga and have tolerated it well. Last imaging study done in November 2016 shows stable disease. His PSA continues  to be under control.  The plan is to continue the same dose and schedule and uses different salvage therapy upon symptomatic progression. Imaging studies will be repeated his PSA starts to rise or around November 2017.  2. Bone directed therapy. On Xgeva monthly with calcium supplementation. I continued to educate him about complications such as osteonecrosis of the jaw. Applications noted since the last injection.  3. Androgen deprivation: Last injection received in May 2017. This will be repeated after 02/25/2016.  4. Lower back pain/ hip pain: This have resolved at this time. Unrelated to his malignancy.  5. Followup: in 4 weeks to follow-up.    Zola Button, MD 6/8/20174:01 PM

## 2015-11-29 NOTE — Patient Instructions (Addendum)
Denosumab injection What is this medicine? DENOSUMAB (den oh sue mab) slows bone breakdown. Prolia is used to treat osteoporosis in women after menopause and in men. Xgeva is used to prevent bone fractures and other bone problems caused by cancer bone metastases. Xgeva is also used to treat giant cell tumor of the bone. This medicine may be used for other purposes; ask your health care provider or pharmacist if you have questions. COMMON BRAND NAME(S): Prolia, XGEVA What should I tell my health care provider before I take this medicine? They need to know if you have any of these conditions: -dental disease -eczema -infection or history of infections -kidney disease or on dialysis -low blood calcium or vitamin D -malabsorption syndrome -scheduled to have surgery or tooth extraction -taking medicine that contains denosumab -thyroid or parathyroid disease -an unusual reaction to denosumab, other medicines, foods, dyes, or preservatives -pregnant or trying to get pregnant -breast-feeding How should I use this medicine? This medicine is for injection under the skin. It is given by a health care professional in a hospital or clinic setting. If you are getting Prolia, a special MedGuide will be given to you by the pharmacist with each prescription and refill. Be sure to read this information carefully each time. For Prolia, talk to your pediatrician regarding the use of this medicine in children. Special care may be needed. For Xgeva, talk to your pediatrician regarding the use of this medicine in children. While this drug may be prescribed for children as young as 13 years for selected conditions, precautions do apply. Overdosage: If you think you've taken too much of this medicine contact a poison control center or emergency room at once. Overdosage: If you think you have taken too much of this medicine contact a poison control center or emergency room at once. NOTE: This medicine is only for  you. Do not share this medicine with others. What if I miss a dose? It is important not to miss your dose. Call your doctor or health care professional if you are unable to keep an appointment. What may interact with this medicine? Do not take this medicine with any of the following medications: -other medicines containing denosumab This medicine may also interact with the following medications: -medicines that suppress the immune system -medicines that treat cancer -steroid medicines like prednisone or cortisone This list may not describe all possible interactions. Give your health care provider a list of all the medicines, herbs, non-prescription drugs, or dietary supplements you use. Also tell them if you smoke, drink alcohol, or use illegal drugs. Some items may interact with your medicine. What should I watch for while using this medicine? Visit your doctor or health care professional for regular checks on your progress. Your doctor or health care professional may order blood tests and other tests to see how you are doing. Call your doctor or health care professional if you get a cold or other infection while receiving this medicine. Do not treat yourself. This medicine may decrease your body's ability to fight infection. You should make sure you get enough calcium and vitamin D while you are taking this medicine, unless your doctor tells you not to. Discuss the foods you eat and the vitamins you take with your health care professional. See your dentist regularly. Brush and floss your teeth as directed. Before you have any dental work done, tell your dentist you are receiving this medicine. Do not become pregnant while taking this medicine or for 5 months after stopping   it. Women should inform their doctor if they wish to become pregnant or think they might be pregnant. There is a potential for serious side effects to an unborn child. Talk to your health care professional or pharmacist for more  information. What side effects may I notice from receiving this medicine? Side effects that you should report to your doctor or health care professional as soon as possible: -allergic reactions like skin rash, itching or hives, swelling of the face, lips, or tongue -breathing problems -chest pain -fast, irregular heartbeat -feeling faint or lightheaded, falls -fever, chills, or any other sign of infection -muscle spasms, tightening, or twitches -numbness or tingling -skin blisters or bumps, or is dry, peels, or red -slow healing or unexplained pain in the mouth or jaw -unusual bleeding or bruising Side effects that usually do not require medical attention (Report these to your doctor or health care professional if they continue or are bothersome.): -muscle pain -stomach upset, gas This list may not describe all possible side effects. Call your doctor for medical advice about side effects. You may report side effects to FDA at 1-800-FDA-1088. Where should I keep my medicine? This medicine is only given in a clinic, doctor's office, or other health care setting and will not be stored at home. NOTE: This sheet is a summary. It may not cover all possible information. If you have questions about this medicine, talk to your doctor, pharmacist, or health care provider.  2015, [employer]. (2011-12-08 12:37:47) Leuprolide depot injection What is this medicine? LEUPROLIDE (loo PROE lide) is a man-made protein that acts like a natural hormone in the body. It decreases testosterone in men and decreases estrogen in women. In men, this medicine is used to treat advanced prostate cancer. In women, some forms of this medicine may be used to treat endometriosis, uterine fibroids, or other male hormone-related problems. This medicine may be used for other purposes; ask your health care provider or pharmacist if you have questions. What should I tell my health care provider before I take this  medicine? They need to know if you have any of these conditions: -diabetes -heart disease or previous heart attack -high blood pressure -high cholesterol -osteoporosis -pain or difficulty passing urine -spinal cord metastasis -stroke -tobacco smoker -unusual vaginal bleeding (women) -an unusual or allergic reaction to leuprolide, benzyl alcohol, other medicines, foods, dyes, or preservatives -pregnant or trying to get pregnant -breast-feeding How should I use this medicine? This medicine is for injection into a muscle or for injection under the skin. It is given by a health care professional in a hospital or clinic setting. The specific product will determine how it will be given to you. Make sure you understand which product you receive and how often you will receive it. Talk to your pediatrician regarding the use of this medicine in children. Special care may be needed. Overdosage: If you think you have taken too much of this medicine contact a poison control center or emergency room at once. NOTE: This medicine is only for you. Do not share this medicine with others. What if I miss a dose? It is important not to miss a dose. Call your doctor or health care professional if you are unable to keep an appointment. Depot injections: Depot injections are given either once-monthly, every 12 weeks, every 16 weeks, or every 24 weeks depending on the product you are prescribed. The product you are prescribed will be based on if you are male or male, and your condition.  Make sure you understand your product and dosing. What may interact with this medicine? Do not take this medicine with any of the following medications: -chasteberry This medicine may also interact with the following medications: -herbal or dietary supplements, like black cohosh or DHEA -male hormones, like estrogens or progestins and birth control pills, patches, rings, or injections -male hormones, like testosterone This  list may not describe all possible interactions. Give your health care provider a list of all the medicines, herbs, non-prescription drugs, or dietary supplements you use. Also tell them if you smoke, drink alcohol, or use illegal drugs. Some items may interact with your medicine. What should I watch for while using this medicine? Visit your doctor or health care professional for regular checks on your progress. During the first weeks of treatment, your symptoms may get worse, but then will improve as you continue your treatment. You may get hot flashes, increased bone pain, increased difficulty passing urine, or an aggravation of nerve symptoms. Discuss these effects with your doctor or health care professional, some of them may improve with continued use of this medicine. Male patients may experience a menstrual cycle or spotting during the first months of therapy with this medicine. If this continues, contact your doctor or health care professional. What side effects may I notice from receiving this medicine? Side effects that you should report to your doctor or health care professional as soon as possible: -allergic reactions like skin rash, itching or hives, swelling of the face, lips, or tongue -breathing problems -chest pain -depression or memory disorders -pain in your legs or groin -pain at site where injected or implanted -severe headache -swelling of the feet and legs -visual changes -vomiting Side effects that usually do not require medical attention (report to your doctor or health care professional if they continue or are bothersome): -breast swelling or tenderness -decrease in sex drive or performance -diarrhea -hot flashes -loss of appetite -muscle, joint, or bone pains -nausea -redness or irritation at site where injected or implanted -skin problems or acne This list may not describe all possible side effects. Call your doctor for medical advice about side effects. You  may report side effects to FDA at 1-800-FDA-1088. Where should I keep my medicine? This drug is given in a hospital or clinic and will not be stored at home. NOTE: This sheet is a summary. It may not cover all possible information. If you have questions about this medicine, talk to your doctor, pharmacist, or health care provider.    2016, [employer]. (2014-03-03 14:16:23) Denosumab injection What is this medicine? DENOSUMAB (den oh sue mab) slows bone breakdown. Prolia is used to treat osteoporosis in women after menopause and in men. Delton See is used to prevent bone fractures and other bone problems caused by cancer bone metastases. Delton See is also used to treat giant cell tumor of the bone. This medicine may be used for other purposes; ask your health care provider or pharmacist if you have questions. COMMON BRAND NAME(S): Prolia, XGEVA What should I tell my health care provider before I take this medicine? They need to know if you have any of these conditions: -dental disease -eczema -infection or history of infections -kidney disease or on dialysis -low blood calcium or vitamin D -malabsorption syndrome -scheduled to have surgery or tooth extraction -taking medicine that contains denosumab -thyroid or parathyroid disease -an unusual reaction to denosumab, other medicines, foods, dyes, or preservatives -pregnant or trying to get pregnant -breast-feeding How should I use this  medicine? This medicine is for injection under the skin. It is given by a health care professional in a hospital or clinic setting. If you are getting Prolia, a special MedGuide will be given to you by the pharmacist with each prescription and refill. Be sure to read this information carefully each time. For Prolia, talk to your pediatrician regarding the use of this medicine in children. Special care may be needed. For Delton See, talk to your pediatrician regarding the use of this medicine in children. While  this drug may be prescribed for children as young as 13 years for selected conditions, precautions do apply. Overdosage: If you think you've taken too much of this medicine contact a poison control center or emergency room at once. Overdosage: If you think you have taken too much of this medicine contact a poison control center or emergency room at once. NOTE: This medicine is only for you. Do not share this medicine with others. What if I miss a dose? It is important not to miss your dose. Call your doctor or health care professional if you are unable to keep an appointment. What may interact with this medicine? Do not take this medicine with any of the following medications: -other medicines containing denosumab This medicine may also interact with the following medications: -medicines that suppress the immune system -medicines that treat cancer -steroid medicines like prednisone or cortisone This list may not describe all possible interactions. Give your health care provider a list of all the medicines, herbs, non-prescription drugs, or dietary supplements you use. Also tell them if you smoke, drink alcohol, or use illegal drugs. Some items may interact with your medicine. What should I watch for while using this medicine? Visit your doctor or health care professional for regular checks on your progress. Your doctor or health care professional may order blood tests and other tests to see how you are doing. Call your doctor or health care professional if you get a cold or other infection while receiving this medicine. Do not treat yourself. This medicine may decrease your body's ability to fight infection. You should make sure you get enough calcium and vitamin D while you are taking this medicine, unless your doctor tells you not to. Discuss the foods you eat and the vitamins you take with your health care professional. See your dentist regularly. Brush and floss your teeth as directed. Before  you have any dental work done, tell your dentist you are receiving this medicine. Do not become pregnant while taking this medicine or for 5 months after stopping it. Women should inform their doctor if they wish to become pregnant or think they might be pregnant. There is a potential for serious side effects to an unborn child. Talk to your health care professional or pharmacist for more information. What side effects may I notice from receiving this medicine? Side effects that you should report to your doctor or health care professional as soon as possible: -allergic reactions like skin rash, itching or hives, swelling of the face, lips, or tongue -breathing problems -chest pain -fast, irregular heartbeat -feeling faint or lightheaded, falls -fever, chills, or any other sign of infection -muscle spasms, tightening, or twitches -numbness or tingling -skin blisters or bumps, or is dry, peels, or red -slow healing or unexplained pain in the mouth or jaw -unusual bleeding or bruising Side effects that usually do not require medical attention (Report these to your doctor or health care professional if they continue or are bothersome.): -muscle pain -stomach  upset, gas This list may not describe all possible side effects. Call your doctor for medical advice about side effects. You may report side effects to FDA at 1-800-FDA-1088. Where should I keep my medicine? This medicine is only given in a clinic, doctor's office, or other health care setting and will not be stored at home. NOTE: This sheet is a summary. It may not cover all possible information. If you have questions about this medicine, talk to your doctor, pharmacist, or health care provider.  2015, [employer]. (2011-12-08 12:37:47) Denosumab injection What is this medicine? DENOSUMAB (den oh sue mab) slows bone breakdown. Prolia is used to treat osteoporosis in women after menopause and in men. Delton See is used to prevent bone  fractures and other bone problems caused by cancer bone metastases. Delton See is also used to treat giant cell tumor of the bone. This medicine may be used for other purposes; ask your health care provider or pharmacist if you have questions. COMMON BRAND NAME(S): Prolia, XGEVA What should I tell my health care provider before I take this medicine? They need to know if you have any of these conditions: -dental disease -eczema -infection or history of infections -kidney disease or on dialysis -low blood calcium or vitamin D -malabsorption syndrome -scheduled to have surgery or tooth extraction -taking medicine that contains denosumab -thyroid or parathyroid disease -an unusual reaction to denosumab, other medicines, foods, dyes, or preservatives -pregnant or trying to get pregnant -breast-feeding How should I use this medicine? This medicine is for injection under the skin. It is given by a health care professional in a hospital or clinic setting. If you are getting Prolia, a special MedGuide will be given to you by the pharmacist with each prescription and refill. Be sure to read this information carefully each time. For Prolia, talk to your pediatrician regarding the use of this medicine in children. Special care may be needed. For Delton See, talk to your pediatrician regarding the use of this medicine in children. While this drug may be prescribed for children as young as 13 years for selected conditions, precautions do apply. Overdosage: If you think you've taken too much of this medicine contact a poison control center or emergency room at once. Overdosage: If you think you have taken too much of this medicine contact a poison control center or emergency room at once. NOTE: This medicine is only for you. Do not share this medicine with others. What if I miss a dose? It is important not to miss your dose. Call your doctor or health care professional if you are unable to keep an appointment. What  may interact with this medicine? Do not take this medicine with any of the following medications: -other medicines containing denosumab This medicine may also interact with the following medications: -medicines that suppress the immune system -medicines that treat cancer -steroid medicines like prednisone or cortisone This list may not describe all possible interactions. Give your health care provider a list of all the medicines, herbs, non-prescription drugs, or dietary supplements you use. Also tell them if you smoke, drink alcohol, or use illegal drugs. Some items may interact with your medicine. What should I watch for while using this medicine? Visit your doctor or health care professional for regular checks on your progress. Your doctor or health care professional may order blood tests and other tests to see how you are doing. Call your doctor or health care professional if you get a cold or other infection while receiving this medicine.  Do not treat yourself. This medicine may decrease your body's ability to fight infection. You should make sure you get enough calcium and vitamin D while you are taking this medicine, unless your doctor tells you not to. Discuss the foods you eat and the vitamins you take with your health care professional. See your dentist regularly. Brush and floss your teeth as directed. Before you have any dental work done, tell your dentist you are receiving this medicine. Do not become pregnant while taking this medicine or for 5 months after stopping it. Women should inform their doctor if they wish to become pregnant or think they might be pregnant. There is a potential for serious side effects to an unborn child. Talk to your health care professional or pharmacist for more information. What side effects may I notice from receiving this medicine? Side effects that you should report to your doctor or health care professional as soon as possible: -allergic reactions like  skin rash, itching or hives, swelling of the face, lips, or tongue -breathing problems -chest pain -fast, irregular heartbeat -feeling faint or lightheaded, falls -fever, chills, or any other sign of infection -muscle spasms, tightening, or twitches -numbness or tingling -skin blisters or bumps, or is dry, peels, or red -slow healing or unexplained pain in the mouth or jaw -unusual bleeding or bruising Side effects that usually do not require medical attention (Report these to your doctor or health care professional if they continue or are bothersome.): -muscle pain -stomach upset, gas This list may not describe all possible side effects. Call your doctor for medical advice about side effects. You may report side effects to FDA at 1-800-FDA-1088. Where should I keep my medicine? This medicine is only given in a clinic, doctor's office, or other health care setting and will not be stored at home. NOTE: This sheet is a summary. It may not cover all possible information. If you have questions about this medicine, talk to your doctor, pharmacist, or health care provider.  2015, [employer]. (2011-12-08 12:37:47)

## 2015-11-30 LAB — PSA: Prostate Specific Ag, Serum: 11.8 ng/mL — ABNORMAL HIGH (ref 0.0–4.0)

## 2015-12-14 ENCOUNTER — Other Ambulatory Visit: Payer: Self-pay | Admitting: *Deleted

## 2015-12-14 ENCOUNTER — Encounter: Payer: Self-pay | Admitting: *Deleted

## 2015-12-14 DIAGNOSIS — C61 Malignant neoplasm of prostate: Secondary | ICD-10-CM

## 2015-12-14 MED ORDER — ABIRATERONE ACETATE 250 MG PO TABS
1000.0000 mg | ORAL_TABLET | Freq: Every day | ORAL | Status: DC
Start: 2015-12-14 — End: 2016-01-22

## 2016-01-04 ENCOUNTER — Other Ambulatory Visit: Payer: Medicare Other

## 2016-01-04 ENCOUNTER — Other Ambulatory Visit (HOSPITAL_BASED_OUTPATIENT_CLINIC_OR_DEPARTMENT_OTHER): Payer: Medicare Other

## 2016-01-04 ENCOUNTER — Ambulatory Visit: Payer: Medicare Other

## 2016-01-04 ENCOUNTER — Ambulatory Visit (HOSPITAL_BASED_OUTPATIENT_CLINIC_OR_DEPARTMENT_OTHER): Payer: Medicare Other

## 2016-01-04 ENCOUNTER — Ambulatory Visit: Payer: Medicare Other | Admitting: Oncology

## 2016-01-04 ENCOUNTER — Telehealth: Payer: Self-pay | Admitting: Oncology

## 2016-01-04 ENCOUNTER — Ambulatory Visit (HOSPITAL_BASED_OUTPATIENT_CLINIC_OR_DEPARTMENT_OTHER): Payer: Medicare Other | Admitting: Oncology

## 2016-01-04 VITALS — BP 147/73 | HR 69 | Temp 98.2°F | Resp 18 | Ht 61.0 in | Wt 171.0 lb

## 2016-01-04 DIAGNOSIS — C7951 Secondary malignant neoplasm of bone: Secondary | ICD-10-CM

## 2016-01-04 DIAGNOSIS — C61 Malignant neoplasm of prostate: Secondary | ICD-10-CM

## 2016-01-04 DIAGNOSIS — Z5111 Encounter for antineoplastic chemotherapy: Secondary | ICD-10-CM | POA: Diagnosis not present

## 2016-01-04 DIAGNOSIS — E291 Testicular hypofunction: Secondary | ICD-10-CM | POA: Diagnosis not present

## 2016-01-04 LAB — CBC WITH DIFFERENTIAL/PLATELET
BASO%: 0.1 % (ref 0.0–2.0)
BASOS ABS: 0 10*3/uL (ref 0.0–0.1)
EOS%: 0.2 % (ref 0.0–7.0)
Eosinophils Absolute: 0 10*3/uL (ref 0.0–0.5)
HCT: 37.4 % — ABNORMAL LOW (ref 38.4–49.9)
HEMOGLOBIN: 12 g/dL — AB (ref 13.0–17.1)
LYMPH#: 1 10*3/uL (ref 0.9–3.3)
LYMPH%: 12.2 % — ABNORMAL LOW (ref 14.0–49.0)
MCH: 26.6 pg — AB (ref 27.2–33.4)
MCHC: 32.1 g/dL (ref 32.0–36.0)
MCV: 82.9 fL (ref 79.3–98.0)
MONO#: 0.3 10*3/uL (ref 0.1–0.9)
MONO%: 3.7 % (ref 0.0–14.0)
NEUT#: 7.1 10*3/uL — ABNORMAL HIGH (ref 1.5–6.5)
NEUT%: 83.8 % — AB (ref 39.0–75.0)
Platelets: 201 10*3/uL (ref 140–400)
RBC: 4.51 10*6/uL (ref 4.20–5.82)
RDW: 15.1 % — AB (ref 11.0–14.6)
WBC: 8.5 10*3/uL (ref 4.0–10.3)

## 2016-01-04 LAB — COMPREHENSIVE METABOLIC PANEL
ALBUMIN: 3.3 g/dL — AB (ref 3.5–5.0)
ALK PHOS: 89 U/L (ref 40–150)
ALT: 9 U/L (ref 0–55)
AST: 17 U/L (ref 5–34)
Anion Gap: 9 mEq/L (ref 3–11)
BUN: 13 mg/dL (ref 7.0–26.0)
CALCIUM: 9 mg/dL (ref 8.4–10.4)
CHLORIDE: 104 meq/L (ref 98–109)
CO2: 30 mEq/L — ABNORMAL HIGH (ref 22–29)
Creatinine: 0.8 mg/dL (ref 0.7–1.3)
GLUCOSE: 97 mg/dL (ref 70–140)
POTASSIUM: 3.8 meq/L (ref 3.5–5.1)
SODIUM: 142 meq/L (ref 136–145)
Total Bilirubin: 0.46 mg/dL (ref 0.20–1.20)
Total Protein: 7 g/dL (ref 6.4–8.3)

## 2016-01-04 MED ORDER — ANTICOAGULANT SODIUM CITRATE 4% (200MG/5ML) IV SOLN
5.0000 mL | Freq: Once | Status: DC
  Filled 2016-01-04: qty 5

## 2016-01-04 MED ORDER — LEUPROLIDE ACETATE (4 MONTH) 30 MG IM KIT
30.0000 mg | PACK | Freq: Once | INTRAMUSCULAR | Status: AC
  Administered 2016-01-04: 30 mg via INTRAMUSCULAR
  Filled 2016-01-04: qty 30

## 2016-01-04 MED ORDER — DENOSUMAB 120 MG/1.7ML ~~LOC~~ SOLN
120.0000 mg | Freq: Once | SUBCUTANEOUS | Status: AC
  Administered 2016-01-04: 120 mg via SUBCUTANEOUS
  Filled 2016-01-04: qty 1.7

## 2016-01-04 NOTE — Progress Notes (Signed)
Hematology and Oncology Follow Up Visit  [name] Mathews [id number] 1946/03/06 70 y.o. 01/04/2016 12:56 PM    Principle Diagnosis:  70 year old gentleman with advanced prostate cancer. He has metastatic disease to the bone. He was inially diagnosed  2001, gleason score 4+5= 9.  Prior Therapy: 1. 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 He was started on hormone therapy, initially with Lupron.  2. Casodex was added to firmagon in 2009 because of PSA rise.  3. 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  Current therapy:  Zytiga 1000 mg po daily started in 09/2011 with prednisone 5 mg daily. Xgeva 120 mg started on 10/31/2011. This is given monthly.  Lupron 30 mg subcutaneously to be resumed on 06/14/2015 and every 4 months after that. Last injection given on 09/21/2015.  Interim History: [name] Mathews presents today for a follow up visit. Since the last visit, he continues to do very well. He remains very active and attending to her activities of daily living.He continues to take as Zytiga without any complications. He has not reported any GI complications such as nausea or abdominal pain. Has not reported any change in his bowel habits.    He is no longer reporting any back pain at this time. Has not reported any pathological fractures or any bone discomfort. He reports no changes in his urine function. He does report some nocturia but no hematuria or dysuria. He denies any complications related to [facility] and continues to be on calcium supplements.  He denies fevers, chills, nausea, vomiting, or changes in bowel or bladder habits. He is eating and drinking well, and denies unexplained weight loss. His energy level is good during the day, and he sleeps well at night. He denies  neurological deficits or lower extremity weakness. He has no shortness of breath, chest pain, cough, swelling, or palpitations. He has no headaches, dizziness, or blurred vision. The remainder of his detailed review of systems is otherwise unremarkable  Medications: I have reviewed the patient's current medications. Current Outpatient Prescriptions  Medication Sig Dispense Refill  . abiraterone Acetate (ZYTIGA) 250 MG tablet Take 4 tablets (1,000 mg total) by mouth daily. Take on an empty stomach 1 hour before or 2 hours after a meal 120 tablet 0  . atorvastatin (LIPITOR) 10 MG tablet Take 10 mg by mouth daily.    . Calcium Carbonate (CALTRATE 600 PO) Take 600 mg by mouth 3 (three) times daily.    [name] denosumab (XGEVA) 120 MG/1.7ML SOLN Inject 120 mg into the skin every 30 (thirty) days.    . DULoxetine (CYMBALTA) 60 MG capsule Take 60 mg by mouth daily.  2  . fish oil-omega-3 fatty acids 1000 MG capsule Take 1 g by mouth daily.    . meloxicam (MOBIC) 7.5 MG tablet   1  . predniSONE (DELTASONE) 5 MG tablet TAKE 1 TABLET (5 MG TOTAL) BY MOUTH 2 (TWO) TIMES DAILY. 60 tablet 1  . rOPINIRole (REQUIP) 2 MG tablet Take 2 mg by mouth 2 (two) times daily.    . vitamin B-12 (CYANOCOBALAMIN) 1000 MCG tablet Take 1,000 mcg by mouth daily.    [name] EPIPEN 2-PAK 0.3 MG/0.3ML SOAJ injection Reported on 01/04/2016     No current facility-administered medications for this visit.     Allergies:  Allergies  Allergen Reactions  . Bee Venom Anaphylaxis, Shortness Of Breath and Swelling    Tongue swelling.   . Wasp Venom Anaphylaxis, Shortness Of Breath and Swelling    Tongue swelling   . Percocet [Oxycodone-Acetaminophen] Palpitations    His past medical history was reviewed today and is unchanged.    Physical Exam: Blood pressure 147/73, pulse 69, temperature 98.2 F (36.8 C), temperature source Oral, resp. rate 18, height 5\' 1"  (1.549 m), weight 171 lb (77.565 kg), SpO2 98 %. ECOG: 0 General  appearance: Well-appearing gentleman without distress. Head: Normocephalic, without obvious abnormality no oral thrush noted. Neck: no adenopathy thyroid masses. Lymph nodes: Cervical, supraclavicular, and axillary nodes normal. Heart:regular rate and rhythm, S1, S2 normal, no murmur, click, rub or gallop Lung:chest clear, no wheezing, rales, normal symmetric air entry Abdomen: soft, non-tender, without masses or organomegaly no rebound or guarding. EXT:no erythema, induration, or nodules Neurological examination: No motor or sensory deficits.   Lab Results: Lab Results  Component Value Date   WBC 8.5 01/04/2016   HGB 12.0* 01/04/2016   HCT 37.4* 01/04/2016   MCV 82.9 01/04/2016   PLT 201 01/04/2016     Chemistry      Component Value Date/Time   NA 141 11/29/2015 1541   NA 143 01/14/2012 0935   K 3.9 11/29/2015 1541   K 4.4 01/14/2012 0935   CL 105 12/08/2012 1504   CL 107 01/14/2012 0935   CO2 31* 11/29/2015 1541   CO2 29 01/14/2012 0935   BUN 13.0 11/29/2015 1541   BUN 11 01/14/2012 0935   CREATININE 0.8 11/29/2015 1541   CREATININE 0.83 01/14/2012 0935      Component Value Date/Time   CALCIUM 9.5 11/29/2015 1541   CALCIUM 9.5 01/14/2012 0935   ALKPHOS 79 11/29/2015 1541   ALKPHOS 87 01/14/2012 0935   AST 20 11/29/2015 1541   AST 18 01/14/2012 0935   ALT 12 11/29/2015 1541   ALT 14 01/14/2012 0935   BILITOT 0.37 11/29/2015 1541   BILITOT 0.7 01/14/2012 0935        Results for [name] Mathews, [name] Mathews (MRN [id number]) as of 01/04/2016 12:48  Ref. Range 09/21/2015 15:23 10/26/2015 15:09 11/29/2015 15:41  PSA Latest Ref Range: 0.0-4.0 ng/mL 11.2 (H) 13.8 (H) 11.8 (H)      Impression and Plan:   70 year old gentleman with the following issues:    1. 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 He has an elevated PSA up to 33 in May of 2013.   He is currently on Zytiga and have tolerated it well since 2013. Last imaging study done in  November 2016 shows stable disease. His PSA continues to be under excellent control currently on 11.8 which is a drop from 13 in May 2017. The plan is to continue with the same dose and schedule and use a different salvage therapy upon symptomatic progression.  Staging workup including a repeat bone scan and CT scan will be needed if his PSA starts to rise. Repeat bone scan will routinely be done in November and certainly sooner if needed to.   2. Bone directed therapy. On Xgeva monthly with calcium supplementation. He reports no issues related to this injection. The plan is to continue it at this time.  3. Androgen deprivation: Last injection given in March 2017. Lupron will be repeated in August 2017.  4. 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 He is no longer having  any issue and does not take any pain medication.  5. Followup: in 4 weeks to follow-up.    [facility], MD 7/14/201712:56 PM

## 2016-01-04 NOTE — Patient Instructions (Signed)
Denosumab injection What is this medicine? DENOSUMAB (den oh sue mab) slows bone breakdown. Prolia is used to treat osteoporosis in women after menopause and in men. Xgeva is used to prevent bone fractures and other bone problems caused by cancer bone metastases. Xgeva is also used to treat giant cell tumor of the bone. This medicine may be used for other purposes; ask your health care provider or pharmacist if you have questions. COMMON BRAND NAME(S): Prolia, XGEVA What should I tell my health care provider before I take this medicine? They need to know if you have any of these conditions: -dental disease -eczema -infection or history of infections -kidney disease or on dialysis -low blood calcium or vitamin D -malabsorption syndrome -scheduled to have surgery or tooth extraction -taking medicine that contains denosumab -thyroid or parathyroid disease -an unusual reaction to denosumab, other medicines, foods, dyes, or preservatives -pregnant or trying to get pregnant -breast-feeding How should I use this medicine? This medicine is for injection under the skin. It is given by a health care professional in a hospital or clinic setting. If you are getting Prolia, a special MedGuide will be given to you by the pharmacist with each prescription and refill. Be sure to read this information carefully each time. For Prolia, talk to your pediatrician regarding the use of this medicine in children. Special care may be needed. For Xgeva, talk to your pediatrician regarding the use of this medicine in children. While this drug may be prescribed for children as young as 13 years for selected conditions, precautions do apply. Overdosage: If you think you've taken too much of this medicine contact a poison control center or emergency room at once. Overdosage: If you think you have taken too much of this medicine contact a poison control center or emergency room at once. NOTE: This medicine is only for  you. Do not share this medicine with others. What if I miss a dose? It is important not to miss your dose. Call your doctor or health care professional if you are unable to keep an appointment. What may interact with this medicine? Do not take this medicine with any of the following medications: -other medicines containing denosumab This medicine may also interact with the following medications: -medicines that suppress the immune system -medicines that treat cancer -steroid medicines like prednisone or cortisone This list may not describe all possible interactions. Give your health care provider a list of all the medicines, herbs, non-prescription drugs, or dietary supplements you use. Also tell them if you smoke, drink alcohol, or use illegal drugs. Some items may interact with your medicine. What should I watch for while using this medicine? Visit your doctor or health care professional for regular checks on your progress. Your doctor or health care professional may order blood tests and other tests to see how you are doing. Call your doctor or health care professional if you get a cold or other infection while receiving this medicine. Do not treat yourself. This medicine may decrease your body's ability to fight infection. You should make sure you get enough calcium and vitamin D while you are taking this medicine, unless your doctor tells you not to. Discuss the foods you eat and the vitamins you take with your health care professional. See your dentist regularly. Brush and floss your teeth as directed. Before you have any dental work done, tell your dentist you are receiving this medicine. Do not become pregnant while taking this medicine or for 5 months after stopping   it. Women should inform their doctor if they wish to become pregnant or think they might be pregnant. There is a potential for serious side effects to an unborn child. Talk to your health care professional or pharmacist for more  information. What side effects may I notice from receiving this medicine? Side effects that you should report to your doctor or health care professional as soon as possible: -allergic reactions like skin rash, itching or hives, swelling of the face, lips, or tongue -breathing problems -chest pain -fast, irregular heartbeat -feeling faint or lightheaded, falls -fever, chills, or any other sign of infection -muscle spasms, tightening, or twitches -numbness or tingling -skin blisters or bumps, or is dry, peels, or red -slow healing or unexplained pain in the mouth or jaw -unusual bleeding or bruising Side effects that usually do not require medical attention (Report these to your doctor or health care professional if they continue or are bothersome.): -muscle pain -stomach upset, gas This list may not describe all possible side effects. Call your doctor for medical advice about side effects. You may report side effects to FDA at 1-800-FDA-1088. Where should I keep my medicine? This medicine is only given in a clinic, doctor's office, or other health care setting and will not be stored at home. NOTE: This sheet is a summary. It may not cover all possible information. If you have questions about this medicine, talk to your doctor, pharmacist, or health care provider.  2015, Elsevier/Gold Standard. (2011-12-08 12:37:47) Leuprolide depot injection What is this medicine? LEUPROLIDE (loo PROE lide) is a man-made protein that acts like a natural hormone in the body. It decreases testosterone in men and decreases estrogen in women. In men, this medicine is used to treat advanced prostate cancer. In women, some forms of this medicine may be used to treat endometriosis, uterine fibroids, or other male hormone-related problems. This medicine may be used for other purposes; ask your health care provider or pharmacist if you have questions. What should I tell my health care provider before I take this  medicine? They need to know if you have any of these conditions: -diabetes -heart disease or previous heart attack -high blood pressure -high cholesterol -osteoporosis -pain or difficulty passing urine -spinal cord metastasis -stroke -tobacco smoker -unusual vaginal bleeding (women) -an unusual or allergic reaction to leuprolide, benzyl alcohol, other medicines, foods, dyes, or preservatives -pregnant or trying to get pregnant -breast-feeding How should I use this medicine? This medicine is for injection into a muscle or for injection under the skin. It is given by a health care professional in a hospital or clinic setting. The specific product will determine how it will be given to you. Make sure you understand which product you receive and how often you will receive it. Talk to your pediatrician regarding the use of this medicine in children. Special care may be needed. Overdosage: If you think you have taken too much of this medicine contact a poison control center or emergency room at once. NOTE: This medicine is only for you. Do not share this medicine with others. What if I miss a dose? It is important not to miss a dose. Call your doctor or health care professional if you are unable to keep an appointment. Depot injections: Depot injections are given either once-monthly, every 12 weeks, every 16 weeks, or every 24 weeks depending on the product you are prescribed. The product you are prescribed will be based on if you are male or male, and your condition.  Make sure you understand your product and dosing. What may interact with this medicine? Do not take this medicine with any of the following medications: -chasteberry This medicine may also interact with the following medications: -herbal or dietary supplements, like black cohosh or DHEA -male hormones, like estrogens or progestins and birth control pills, patches, rings, or injections -male hormones, like testosterone This  list may not describe all possible interactions. Give your health care provider a list of all the medicines, herbs, non-prescription drugs, or dietary supplements you use. Also tell them if you smoke, drink alcohol, or use illegal drugs. Some items may interact with your medicine. What should I watch for while using this medicine? Visit your doctor or health care professional for regular checks on your progress. During the first weeks of treatment, your symptoms may get worse, but then will improve as you continue your treatment. You may get hot flashes, increased bone pain, increased difficulty passing urine, or an aggravation of nerve symptoms. Discuss these effects with your doctor or health care professional, some of them may improve with continued use of this medicine. Male patients may experience a menstrual cycle or spotting during the first months of therapy with this medicine. If this continues, contact your doctor or health care professional. What side effects may I notice from receiving this medicine? Side effects that you should report to your doctor or health care professional as soon as possible: -allergic reactions like skin rash, itching or hives, swelling of the face, lips, or tongue -breathing problems -chest pain -depression or memory disorders -pain in your legs or groin -pain at site where injected or implanted -severe headache -swelling of the feet and legs -visual changes -vomiting Side effects that usually do not require medical attention (report to your doctor or health care professional if they continue or are bothersome): -breast swelling or tenderness -decrease in sex drive or performance -diarrhea -hot flashes -loss of appetite -muscle, joint, or bone pains -nausea -redness or irritation at site where injected or implanted -skin problems or acne This list may not describe all possible side effects. Call your doctor for medical advice about side effects. You  may report side effects to FDA at 1-800-FDA-1088. Where should I keep my medicine? This drug is given in a hospital or clinic and will not be stored at home. NOTE: This sheet is a summary. It may not cover all possible information. If you have questions about this medicine, talk to your doctor, pharmacist, or health care provider.    2016, Elsevier/Gold Standard. (2014-03-03 14:16:23)

## 2016-01-04 NOTE — Telephone Encounter (Signed)
per of to sch pt appt-gave pt copy of avs °

## 2016-01-05 LAB — PSA: Prostate Specific Ag, Serum: 14 ng/mL — ABNORMAL HIGH (ref 0.0–4.0)

## 2016-01-21 ENCOUNTER — Encounter: Payer: Self-pay | Admitting: *Deleted

## 2016-01-22 ENCOUNTER — Other Ambulatory Visit: Payer: Self-pay | Admitting: *Deleted

## 2016-01-22 DIAGNOSIS — C61 Malignant neoplasm of prostate: Secondary | ICD-10-CM

## 2016-01-22 MED ORDER — ABIRATERONE ACETATE 250 MG PO TABS: 1000.0000 mg | ORAL_TABLET | Freq: Every day | ORAL | 0 refills | Status: DC

## 2016-01-30 DIAGNOSIS — M5415 Radiculopathy, thoracolumbar region: Secondary | ICD-10-CM | POA: Diagnosis not present

## 2016-01-30 DIAGNOSIS — G609 Hereditary and idiopathic neuropathy, unspecified: Secondary | ICD-10-CM | POA: Diagnosis not present

## 2016-01-30 DIAGNOSIS — G2581 Restless legs syndrome: Secondary | ICD-10-CM | POA: Diagnosis not present

## 2016-01-30 DIAGNOSIS — G210 Malignant neuroleptic syndrome: Secondary | ICD-10-CM | POA: Diagnosis not present

## 2016-02-08 ENCOUNTER — Other Ambulatory Visit (HOSPITAL_BASED_OUTPATIENT_CLINIC_OR_DEPARTMENT_OTHER): Payer: Medicare Other

## 2016-02-08 ENCOUNTER — Ambulatory Visit (HOSPITAL_BASED_OUTPATIENT_CLINIC_OR_DEPARTMENT_OTHER): Payer: Medicare Other

## 2016-02-08 ENCOUNTER — Ambulatory Visit (HOSPITAL_BASED_OUTPATIENT_CLINIC_OR_DEPARTMENT_OTHER): Payer: Medicare Other | Admitting: Oncology

## 2016-02-08 ENCOUNTER — Telehealth: Payer: Self-pay | Admitting: Oncology

## 2016-02-08 VITALS — BP 168/73 | HR 69 | Temp 98.5°F | Resp 18 | Ht 61.0 in | Wt 169.7 lb

## 2016-02-08 DIAGNOSIS — E291 Testicular hypofunction: Secondary | ICD-10-CM

## 2016-02-08 DIAGNOSIS — C61 Malignant neoplasm of prostate: Secondary | ICD-10-CM

## 2016-02-08 DIAGNOSIS — C7951 Secondary malignant neoplasm of bone: Secondary | ICD-10-CM | POA: Diagnosis not present

## 2016-02-08 LAB — CBC WITH DIFFERENTIAL/PLATELET
BASO%: 0.1 % (ref 0.0–2.0)
Basophils Absolute: 0 10*3/uL (ref 0.0–0.1)
EOS%: 0.3 % (ref 0.0–7.0)
Eosinophils Absolute: 0 10*3/uL (ref 0.0–0.5)
HCT: 37.2 % — ABNORMAL LOW (ref 38.4–49.9)
HEMOGLOBIN: 11.9 g/dL — AB (ref 13.0–17.1)
LYMPH%: 20.4 % (ref 14.0–49.0)
MCH: 26.9 pg — ABNORMAL LOW (ref 27.2–33.4)
MCHC: 32 g/dL (ref 32.0–36.0)
MCV: 84 fL (ref 79.3–98.0)
MONO#: 0.3 10*3/uL (ref 0.1–0.9)
MONO%: 4.8 % (ref 0.0–14.0)
NEUT%: 74.4 % (ref 39.0–75.0)
NEUTROS ABS: 5.3 10*3/uL (ref 1.5–6.5)
PLATELETS: 201 10*3/uL (ref 140–400)
RBC: 4.43 10*6/uL (ref 4.20–5.82)
RDW: 14.7 % — AB (ref 11.0–14.6)
WBC: 7.1 10*3/uL (ref 4.0–10.3)
lymph#: 1.5 10*3/uL (ref 0.9–3.3)

## 2016-02-08 LAB — COMPREHENSIVE METABOLIC PANEL
ALBUMIN: 3.3 g/dL — AB (ref 3.5–5.0)
ANION GAP: 10 meq/L (ref 3–11)
AST: 20 U/L (ref 5–34)
Alkaline Phosphatase: 81 U/L (ref 40–150)
BILIRUBIN TOTAL: 0.33 mg/dL (ref 0.20–1.20)
BUN: 14.5 mg/dL (ref 7.0–26.0)
CO2: 26 meq/L (ref 22–29)
CREATININE: 0.9 mg/dL (ref 0.7–1.3)
Calcium: 9.5 mg/dL (ref 8.4–10.4)
Chloride: 107 mEq/L (ref 98–109)
EGFR: >90 mL/min/{1.73_m2} (ref >90)
GLUCOSE: 117 mg/dL (ref 70–140)
Potassium: 4.1 mEq/L (ref 3.5–5.1)
SODIUM: 143 meq/L (ref 136–145)
TOTAL PROTEIN: 6.8 g/dL (ref 6.4–8.3)

## 2016-02-08 MED ORDER — DENOSUMAB 120 MG/1.7ML ~~LOC~~ SOLN
120.0000 mg | Freq: Once | SUBCUTANEOUS | Status: AC
  Administered 2016-02-08: 120 mg via SUBCUTANEOUS
  Filled 2016-02-08: qty 1.7

## 2016-02-08 NOTE — Patient Instructions (Signed)
Denosumab injection  What is this medicine?  DENOSUMAB (den oh sue mab) slows bone breakdown. Prolia is used to treat osteoporosis in women after menopause and in men. Xgeva is used to prevent bone fractures and other bone problems caused by cancer bone metastases. Xgeva is also used to treat giant cell tumor of the bone.  This medicine may be used for other purposes; ask your health care provider or pharmacist if you have questions.  What should I tell my health care provider before I take this medicine?  They need to know if you have any of these conditions:  -dental disease  -eczema  -infection or history of infections  -kidney disease or on dialysis  -low blood calcium or vitamin D  -malabsorption syndrome  -scheduled to have surgery or tooth extraction  -taking medicine that contains denosumab  -thyroid or parathyroid disease  -an unusual reaction to denosumab, other medicines, foods, dyes, or preservatives  -pregnant or trying to get pregnant  -breast-feeding  How should I use this medicine?  This medicine is for injection under the skin. It is given by a health care professional in a hospital or clinic setting.  If you are getting Prolia, a special MedGuide will be given to you by the pharmacist with each prescription and refill. Be sure to read this information carefully each time.  For Prolia, talk to your pediatrician regarding the use of this medicine in children. Special care may be needed. For Xgeva, talk to your pediatrician regarding the use of this medicine in children. While this drug may be prescribed for children as young as 13 years for selected conditions, precautions do apply.  Overdosage: If you think you have taken too much of this medicine contact a poison control center or emergency room at once.  NOTE: This medicine is only for you. Do not share this medicine with others.  What if I miss a dose?  It is important not to miss your dose. Call your doctor or health care professional if you are  unable to keep an appointment.  What may interact with this medicine?  Do not take this medicine with any of the following medications:  -other medicines containing denosumab  This medicine may also interact with the following medications:  -medicines that suppress the immune system  -medicines that treat cancer  -steroid medicines like prednisone or cortisone  This list may not describe all possible interactions. Give your health care provider a list of all the medicines, herbs, non-prescription drugs, or dietary supplements you use. Also tell them if you smoke, drink alcohol, or use illegal drugs. Some items may interact with your medicine.  What should I watch for while using this medicine?  Visit your doctor or health care professional for regular checks on your progress. Your doctor or health care professional may order blood tests and other tests to see how you are doing.  Call your doctor or health care professional if you get a cold or other infection while receiving this medicine. Do not treat yourself. This medicine may decrease your body's ability to fight infection.  You should make sure you get enough calcium and vitamin D while you are taking this medicine, unless your doctor tells you not to. Discuss the foods you eat and the vitamins you take with your health care professional.  See your dentist regularly. Brush and floss your teeth as directed. Before you have any dental work done, tell your dentist you are receiving this medicine.  Do   not become pregnant while taking this medicine or for 5 months after stopping it. Women should inform their doctor if they wish to become pregnant or think they might be pregnant. There is a potential for serious side effects to an unborn child. Talk to your health care professional or pharmacist for more information.  What side effects may I notice from receiving this medicine?  Side effects that you should report to your doctor or health care professional as soon as  possible:  -allergic reactions like skin rash, itching or hives, swelling of the face, lips, or tongue  -breathing problems  -chest pain  -fast, irregular heartbeat  -feeling faint or lightheaded, falls  -fever, chills, or any other sign of infection  -muscle spasms, tightening, or twitches  -numbness or tingling  -skin blisters or bumps, or is dry, peels, or red  -slow healing or unexplained pain in the mouth or jaw  -unusual bleeding or bruising  Side effects that usually do not require medical attention (Report these to your doctor or health care professional if they continue or are bothersome.):  -muscle pain  -stomach upset, gas  This list may not describe all possible side effects. Call your doctor for medical advice about side effects. You may report side effects to FDA at 1-800-FDA-1088.  Where should I keep my medicine?  This medicine is only given in a clinic, doctor's office, or other health care setting and will not be stored at home.  NOTE: This sheet is a summary. It may not cover all possible information. If you have questions about this medicine, talk to your doctor, pharmacist, or health care provider.      2016, Elsevier/Gold Standard. (2011-12-08 12:37:47)

## 2016-02-08 NOTE — Progress Notes (Signed)
Hematology and Oncology Follow Up Visit  BRAEDY SHAWLEY [id number] [birth year] 70 y.o. 02/08/2016 3:33 PM    Principle Diagnosis:  70 year old gentleman with advanced prostate cancer. He has metastatic disease to the bone. He was inially diagnosed  2001, gleason score 4+5= 9.  Prior Therapy: 1. 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 He was started on hormone therapy, initially with Lupron.  2. Casodex was added to firmagon in 2009 because of PSA rise.  3. 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  Current therapy:  Zytiga 1000 mg po daily started in 09/2011 with prednisone 5 mg daily. Xgeva 120 mg started on 10/31/2011. This is given monthly.  Lupron 30 mg subcutaneously to be resumed on 06/14/2015 and every 4 months after that. Last injection given on 01/04/2016.  Interim History: Mr. Regimbal presents today for a follow up visit. Since the last visit, he reports no major changes or decline in his health. .He continues to take as Zytiga without any complications. He has not reported any GI complications such as nausea or abdominal pain. Has not reported any change in his bowel habits. He has no issues obtained this medication at this time. He remains active and performs activities of daily living.   Has not reported any pathological fractures or any bone discomfort. He reports no changes in his urine function. He does report some nocturia but no hematuria or dysuria. He denies any complications related to [facility] and continues to be on calcium supplements. He denied any back pain which she had in the past.  He denies fevers, chills, nausea, vomiting, or changes in bowel or bladder habits. He is eating and drinking well, and denies unexplained weight loss. His energy level is good  during the day, and he sleeps well at night. He denies neurological deficits or lower extremity weakness. He has no shortness of breath, chest pain, cough, swelling, or palpitations. He has no headaches, dizziness, or blurred vision. The remainder of his detailed review of systems is otherwise unremarkable  Medications: I have reviewed the patient's current medications. Current Outpatient Prescriptions  Medication Sig Dispense Refill  . abiraterone Acetate (ZYTIGA) 250 MG tablet Take 4 tablets (1,000 mg total) by mouth daily. Take on an empty stomach 1 hour before or 2 hours after a meal 120 tablet 0  . atorvastatin (LIPITOR) 10 MG tablet Take 10 mg by mouth daily.    . Calcium Carbonate (CALTRATE 600 PO) Take 600 mg by mouth 3 (three) times daily.    [name] denosumab (XGEVA) 120 MG/1.7ML SOLN Inject 120 mg into the skin every 30 (thirty) days.    . DULoxetine (CYMBALTA) 60 MG capsule Take 60 mg by mouth daily.  2  . EPIPEN 2-PAK 0.3 MG/0.3ML SOAJ injection Reported on 01/04/2016    . fish oil-omega-3 fatty acids 1000 MG capsule Take 1 g by mouth daily.    . meloxicam (MOBIC) 7.5 MG tablet   1  . predniSONE (DELTASONE) 5 MG tablet TAKE 1 TABLET (5 MG TOTAL) BY MOUTH 2 (TWO) TIMES DAILY. 60 tablet 1  . rOPINIRole (REQUIP) 2 MG tablet Take 2 mg by mouth 2 (two) times daily.    . vitamin B-12 (CYANOCOBALAMIN) 1000 MCG tablet Take 1,000 mcg by mouth daily.     No current facility-administered medications  for this visit.      Allergies:  Allergies  Allergen Reactions  . Bee Venom Anaphylaxis, Shortness Of Breath and Swelling    Tongue swelling.   . Wasp Venom Anaphylaxis, Shortness Of Breath and Swelling    Tongue swelling   . Percocet [Oxycodone-Acetaminophen] Palpitations    His past medical history was reviewed today and is unchanged.    Physical Exam: Blood pressure (!) 168/73, pulse 69, temperature 98.5 F (36.9 C), temperature source Oral, resp. rate 18, height 5\' 1"  (1.549 m),  weight 169 lb 11.2 oz (77 kg), SpO2 99 %. ECOG: 0 General appearance: Alert, awake gentleman without distress.  Head: Normocephalic, without obvious abnormality no oral ulcers or lesions. Neck: no adenopathy thyroid masses. Lymph nodes: Cervical, supraclavicular, and axillary nodes normal. Heart:regular rate and rhythm, S1, S2 normal, no murmur, click, rub or gallop Lung:chest clear, no wheezing, rales, normal symmetric air entry Abdomen: soft, non-tender, without masses or organomegaly no shifting dullness or ascites. EXT:no erythema, induration, or nodules Neurological examination: No motor or sensory deficits.   Lab Results: Lab Results  Component Value Date   WBC 7.1 02/08/2016   HGB 11.9 (L) 02/08/2016   HCT 37.2 (L) 02/08/2016   MCV 84.0 02/08/2016   PLT 201 02/08/2016     Chemistry      Component Value Date/Time   NA 142 01/04/2016 1229   K 3.8 01/04/2016 1229   CL 105 12/08/2012 1504   CO2 30 (H) 01/04/2016 1229   BUN 13.0 01/04/2016 1229   CREATININE 0.8 01/04/2016 1229      Component Value Date/Time   CALCIUM 9.0 01/04/2016 1229   ALKPHOS 89 01/04/2016 1229   AST 17 01/04/2016 1229   ALT 9 01/04/2016 1229   BILITOT 0.46 01/04/2016 1229        Results for NOEY, STEINES (MRN [id number]) as of 02/08/2016 15:26  Ref. Range 10/26/2015 15:09 11/29/2015 15:41 01/04/2016 12:29  PSA Latest Ref Range: 0.0 - 4.0 ng/mL 13.8 (H) 11.8 (H) 14.0 (H)      Impression and Plan:   70 year old gentleman with the following issues:    1. 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 He has an elevated PSA up to 33 in May of 2013.   He is currently on Zytiga and have tolerated it well since 2013. He continues to tolerate this medication without any complications. His PSA remains stable over the last year.  The plan is to continue with the same dose and schedule and repeat staging workup if his PSA starts to rise.  2. Bone directed therapy. On  Xgeva monthly with calcium supplementation. He reports no issues related to this injection. Complications that includes hypocalcemia, osteonecrosis of the jaw among others were reviewed and is agreeable to continue.  3. Androgen deprivation: Last injection given in July 2017 and will be repeated in November 2017.  4. Lower back pain/ hip pain: Arthritic in nature and has resolved at this time.  5. Followup: in 4 weeks to follow-up.    Zola Button, MD 8/18/20173:33 PM

## 2016-02-08 NOTE — Telephone Encounter (Signed)
Gave pt cal & avs °

## 2016-02-09 LAB — PSA: PROSTATE SPECIFIC AG, SERUM: 14.2 ng/mL — AB (ref 0.0–4.0)

## 2016-02-20 ENCOUNTER — Other Ambulatory Visit: Payer: Self-pay | Admitting: *Deleted

## 2016-02-20 DIAGNOSIS — C61 Malignant neoplasm of prostate: Secondary | ICD-10-CM

## 2016-02-20 MED ORDER — ABIRATERONE ACETATE 250 MG PO TABS: 1000.0000 mg | ORAL_TABLET | Freq: Every day | ORAL | 0 refills | Status: DC

## 2016-03-14 ENCOUNTER — Telehealth: Payer: Self-pay | Admitting: Oncology

## 2016-03-14 ENCOUNTER — Other Ambulatory Visit (HOSPITAL_BASED_OUTPATIENT_CLINIC_OR_DEPARTMENT_OTHER): Payer: Medicare Other

## 2016-03-14 ENCOUNTER — Ambulatory Visit (HOSPITAL_BASED_OUTPATIENT_CLINIC_OR_DEPARTMENT_OTHER): Payer: Medicare Other | Admitting: Oncology

## 2016-03-14 ENCOUNTER — Ambulatory Visit (HOSPITAL_BASED_OUTPATIENT_CLINIC_OR_DEPARTMENT_OTHER): Payer: Medicare Other

## 2016-03-14 ENCOUNTER — Other Ambulatory Visit: Payer: Self-pay | Admitting: *Deleted

## 2016-03-14 VITALS — BP 151/73 | HR 61 | Temp 98.9°F | Resp 17 | Ht 61.0 in | Wt 170.7 lb

## 2016-03-14 DIAGNOSIS — C61 Malignant neoplasm of prostate: Secondary | ICD-10-CM

## 2016-03-14 DIAGNOSIS — C7951 Secondary malignant neoplasm of bone: Secondary | ICD-10-CM

## 2016-03-14 DIAGNOSIS — E291 Testicular hypofunction: Secondary | ICD-10-CM | POA: Diagnosis not present

## 2016-03-14 DIAGNOSIS — M25559 Pain in unspecified hip: Secondary | ICD-10-CM

## 2016-03-14 DIAGNOSIS — M545 Low back pain: Secondary | ICD-10-CM | POA: Diagnosis not present

## 2016-03-14 LAB — COMPREHENSIVE METABOLIC PANEL
ALBUMIN: 3.2 g/dL — AB (ref 3.5–5.0)
ALK PHOS: 72 U/L (ref 40–150)
ALT: 11 U/L (ref 0–55)
AST: 21 U/L (ref 5–34)
Anion Gap: 7 mEq/L (ref 3–11)
BILIRUBIN TOTAL: 0.42 mg/dL (ref 0.20–1.20)
BUN: 15.5 mg/dL (ref 7.0–26.0)
CO2: 30 meq/L — AB (ref 22–29)
Calcium: 9.4 mg/dL (ref 8.4–10.4)
Chloride: 107 mEq/L (ref 98–109)
Creatinine: 0.9 mg/dL (ref 0.7–1.3)
EGFR: >90 mL/min/{1.73_m2} (ref >90)
GLUCOSE: 80 mg/dL (ref 70–140)
POTASSIUM: 4 meq/L (ref 3.5–5.1)
SODIUM: 143 meq/L (ref 136–145)
TOTAL PROTEIN: 6.6 g/dL (ref 6.4–8.3)

## 2016-03-14 LAB — CBC WITH DIFFERENTIAL/PLATELET
BASO%: 0.2 % (ref 0.0–2.0)
Basophils Absolute: 0 10*3/uL (ref 0.0–0.1)
EOS%: 1.4 % (ref 0.0–7.0)
Eosinophils Absolute: 0.1 10*3/uL (ref 0.0–0.5)
HCT: 37.3 % — ABNORMAL LOW (ref 38.4–49.9)
HEMOGLOBIN: 11.9 g/dL — AB (ref 13.0–17.1)
LYMPH%: 23.9 % (ref 14.0–49.0)
MCH: 26.5 pg — ABNORMAL LOW (ref 27.2–33.4)
MCHC: 31.9 g/dL — ABNORMAL LOW (ref 32.0–36.0)
MCV: 83.1 fL (ref 79.3–98.0)
MONO#: 0.8 10*3/uL (ref 0.1–0.9)
MONO%: 8.5 % (ref 0.0–14.0)
NEUT%: 66 % (ref 39.0–75.0)
NEUTROS ABS: 6.1 10*3/uL (ref 1.5–6.5)
Platelets: 208 10*3/uL (ref 140–400)
RBC: 4.49 10*6/uL (ref 4.20–5.82)
RDW: 14.3 % (ref 11.0–14.6)
WBC: 9.2 10*3/uL (ref 4.0–10.3)
lymph#: 2.2 10*3/uL (ref 0.9–3.3)

## 2016-03-14 MED ORDER — PREDNISONE 5 MG PO TABS: ORAL_TABLET | ORAL | 1 refills | Status: DC

## 2016-03-14 MED ORDER — DENOSUMAB 120 MG/1.7ML ~~LOC~~ SOLN
120.0000 mg | Freq: Once | SUBCUTANEOUS | Status: AC
  Administered 2016-03-14: 120 mg via SUBCUTANEOUS
  Filled 2016-03-14: qty 1.7

## 2016-03-14 NOTE — Telephone Encounter (Signed)
Gave patient avs report and appointments for November.  °

## 2016-03-14 NOTE — Progress Notes (Signed)
Hematology and Oncology Follow Up Visit  [name] Mathews [id number] 05/05/1946 70 y.o. 03/14/2016 4:05 PM    Principle Diagnosis:  70 year old gentleman with advanced prostate cancer. He has metastatic disease to the bone. He was inially diagnosed  2001, gleason score 4+5= 9.  Prior Therapy: 1. 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 He was started on hormone therapy, initially with Lupron.  2. Casodex was added to firmagon in 2009 because of PSA rise.  3. 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  Current therapy:  Zytiga 1000 mg po daily started in 09/2011 with prednisone 5 mg daily. Xgeva 120 mg started on 10/31/2011. This is given monthly.  Lupron 30 mg subcutaneously to be resumed on 06/14/2015 and every 4 months after that. Last injection given on 01/04/2016.  Interim History: [name] Mathews presents today for a follow up visit. Since the last visit, he continues to do very well without major changes in his health. He was playing golf more regularly since the last visit and caused some intermittent back pain at times. His pain is predominantly in the lower back and not associated with any neurological deficits. His ambulating without any difficulties.    He continues to take as Zytiga without any complications. He has not reported any GI complications such as nausea or abdominal pain. Has not reported any change in his bowel habits. He has no issues obtained this medication at this time. He remains active and performs activities of daily living.  He denied any other bone pain or pathological fractures. He does not report any complications related to [facility]  He does not report any headaches, blurry vision, syncope or seizures. He does not report any fevers or chills or  sweats.  He has no shortness of breath, chest pain, cough, swelling, or palpitations. He denies any nausea, vomiting or abdominal pain. He does not report any frequency urgency or hesitancy. The remainder of his detailed review of systems is otherwise unremarkable  Medications: I have reviewed the patient's current medications. Current Outpatient Prescriptions  Medication Sig Dispense Refill  . abiraterone Acetate (ZYTIGA) 250 MG tablet Take 4 tablets (1,000 mg total) by mouth daily. Take on an empty stomach 1 hour before or 2 hours after a meal 120 tablet 0  . atorvastatin (LIPITOR) 10 MG tablet Take 10 mg by mouth daily.    . Calcium Carbonate (CALTRATE 600 PO) Take 600 mg by mouth 3 (three) times daily.    [name] denosumab (XGEVA) 120 MG/1.7ML SOLN Inject 120 mg into the skin every 30 (thirty) days.    . DULoxetine (CYMBALTA) 60 MG capsule Take 60 mg by mouth daily.  2  . EPIPEN 2-PAK 0.3 MG/0.3ML SOAJ injection Reported on 01/04/2016    . fish oil-omega-3 fatty acids 1000 MG capsule Take 1 g by mouth daily.    . meloxicam (MOBIC) 7.5 MG tablet   1  . rOPINIRole (REQUIP) 2 MG tablet Take 2 mg by mouth 2 (two) times daily.    . vitamin B-12 (CYANOCOBALAMIN) 1000 MCG tablet Take 1,000 mcg by mouth daily.    . predniSONE (DELTASONE) 5 MG tablet TAKE 1 TABLET (5 MG TOTAL) BY MOUTH 2 (TWO) TIMES DAILY. 60 tablet 1   No current facility-administered medications for this visit.  Allergies:  Allergies  Allergen Reactions  . Bee Venom Anaphylaxis, Shortness Of Breath and Swelling    Tongue swelling.   . Wasp Venom Anaphylaxis, Shortness Of Breath and Swelling    Tongue swelling   . Percocet [Oxycodone-Acetaminophen] Palpitations    His past medical history was reviewed today and is unchanged.    Physical Exam: Blood pressure (!) 151/73, pulse 61, temperature 98.9 F (37.2 C), temperature source Oral, resp. rate 17, height 5\' 1"  (1.549 m), weight 170 lb 11.2 oz (77.4 kg), SpO2 100  %. ECOG: 0 General appearance: Well-appearing gentleman without distress. Head: Normocephalic, without obvious abnormality no oral ulcers or lesions. Neck: no adenopathy thyroid masses. Lymph nodes: Cervical, supraclavicular, and axillary nodes normal. Heart:regular rate and rhythm, S1, S2 normal, no murmur, click, rub or gallop Lung:chest clear, no wheezing, rales, normal symmetric air entry Abdomen: soft, non-tender, without masses or organomegaly no rebound or guarding. EXT:no erythema, induration, or nodules Neurological examination: No motor or sensory deficits.   Lab Results: Lab Results  Component Value Date   WBC 9.2 03/14/2016   HGB 11.9 (L) 03/14/2016   HCT 37.3 (L) 03/14/2016   MCV 83.1 03/14/2016   PLT 208 03/14/2016     Chemistry      Component Value Date/Time   NA 143 02/08/2016 1511   K 4.1 02/08/2016 1511   CL 105 12/08/2012 1504   CO2 26 02/08/2016 1511   BUN 14.5 02/08/2016 1511   CREATININE 0.9 02/08/2016 1511      Component Value Date/Time   CALCIUM 9.5 02/08/2016 1511   ALKPHOS 81 02/08/2016 1511   AST 20 02/08/2016 1511   ALT <9 02/08/2016 1511   BILITOT 0.33 02/08/2016 1511         Results for [name] Mathews, [name] Mathews (MRN [id number]) as of 03/14/2016 15:57  Ref. Range 11/29/2015 15:41 01/04/2016 12:29 02/08/2016 15:11  PSA Latest Ref Range: 0.0 - 4.0 ng/mL 11.8 (H) 14.0 (H) 14.2 (H)       Impression and Plan:   70 year old gentleman with the following issues:    1. 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 He has an elevated PSA up to 33 in May of 2013.   He is currently on Zytiga and have tolerated it well since 2013. He continues to tolerate this medication without any complications.   His PSA remains stable around 14 without any major changes. The plan is to continue on the same dose and schedule and use of her salvage therapy upon symptomatic progression. He will require repeat staging workup. Develops rapid rise  in his PSA.   2. Bone directed therapy. On Xgeva monthly with calcium supplementation.Complications that includes hypocalcemia, osteonecrosis of the jaw among others were reviewed and is agreeable to continue. He reports no complications or objections to continue.  3. Androgen deprivation: Last injection given in July 2017 and will be repeated in November 2017.  4. 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  5. Followup: in 4 weeks to follow-up.    [facility], MD 9/22/20174:05 PM

## 2016-03-14 NOTE — Patient Instructions (Signed)
Denosumab injection  What is this medicine?  DENOSUMAB (den oh sue mab) slows bone breakdown. Prolia is used to treat osteoporosis in women after menopause and in men. Xgeva is used to prevent bone fractures and other bone problems caused by cancer bone metastases. Xgeva is also used to treat giant cell tumor of the bone.  This medicine may be used for other purposes; ask your health care provider or pharmacist if you have questions.  What should I tell my health care provider before I take this medicine?  They need to know if you have any of these conditions:  -dental disease  -eczema  -infection or history of infections  -kidney disease or on dialysis  -low blood calcium or vitamin D  -malabsorption syndrome  -scheduled to have surgery or tooth extraction  -taking medicine that contains denosumab  -thyroid or parathyroid disease  -an unusual reaction to denosumab, other medicines, foods, dyes, or preservatives  -pregnant or trying to get pregnant  -breast-feeding  How should I use this medicine?  This medicine is for injection under the skin. It is given by a health care professional in a hospital or clinic setting.  If you are getting Prolia, a special MedGuide will be given to you by the pharmacist with each prescription and refill. Be sure to read this information carefully each time.  For Prolia, talk to your pediatrician regarding the use of this medicine in children. Special care may be needed. For Xgeva, talk to your pediatrician regarding the use of this medicine in children. While this drug may be prescribed for children as young as 13 years for selected conditions, precautions do apply.  Overdosage: If you think you have taken too much of this medicine contact a poison control center or emergency room at once.  NOTE: This medicine is only for you. Do not share this medicine with others.  What if I miss a dose?  It is important not to miss your dose. Call your doctor or health care professional if you are  unable to keep an appointment.  What may interact with this medicine?  Do not take this medicine with any of the following medications:  -other medicines containing denosumab  This medicine may also interact with the following medications:  -medicines that suppress the immune system  -medicines that treat cancer  -steroid medicines like prednisone or cortisone  This list may not describe all possible interactions. Give your health care provider a list of all the medicines, herbs, non-prescription drugs, or dietary supplements you use. Also tell them if you smoke, drink alcohol, or use illegal drugs. Some items may interact with your medicine.  What should I watch for while using this medicine?  Visit your doctor or health care professional for regular checks on your progress. Your doctor or health care professional may order blood tests and other tests to see how you are doing.  Call your doctor or health care professional if you get a cold or other infection while receiving this medicine. Do not treat yourself. This medicine may decrease your body's ability to fight infection.  You should make sure you get enough calcium and vitamin D while you are taking this medicine, unless your doctor tells you not to. Discuss the foods you eat and the vitamins you take with your health care professional.  See your dentist regularly. Brush and floss your teeth as directed. Before you have any dental work done, tell your dentist you are receiving this medicine.  Do   not become pregnant while taking this medicine or for 5 months after stopping it. Women should inform their doctor if they wish to become pregnant or think they might be pregnant. There is a potential for serious side effects to an unborn child. Talk to your health care professional or pharmacist for more information.  What side effects may I notice from receiving this medicine?  Side effects that you should report to your doctor or health care professional as soon as  possible:  -allergic reactions like skin rash, itching or hives, swelling of the face, lips, or tongue  -breathing problems  -chest pain  -fast, irregular heartbeat  -feeling faint or lightheaded, falls  -fever, chills, or any other sign of infection  -muscle spasms, tightening, or twitches  -numbness or tingling  -skin blisters or bumps, or is dry, peels, or red  -slow healing or unexplained pain in the mouth or jaw  -unusual bleeding or bruising  Side effects that usually do not require medical attention (Report these to your doctor or health care professional if they continue or are bothersome.):  -muscle pain  -stomach upset, gas  This list may not describe all possible side effects. Call your doctor for medical advice about side effects. You may report side effects to FDA at 1-800-FDA-1088.  Where should I keep my medicine?  This medicine is only given in a clinic, doctor's office, or other health care setting and will not be stored at home.  NOTE: This sheet is a summary. It may not cover all possible information. If you have questions about this medicine, talk to your doctor, pharmacist, or health care provider.      2016, Elsevier/Gold Standard. (2011-12-08 12:37:47)

## 2016-03-15 LAB — PSA: Prostate Specific Ag, Serum: 16.5 ng/mL — ABNORMAL HIGH (ref 0.0–4.0)

## 2016-03-25 ENCOUNTER — Other Ambulatory Visit: Payer: Self-pay | Admitting: *Deleted

## 2016-03-25 DIAGNOSIS — C61 Malignant neoplasm of prostate: Secondary | ICD-10-CM

## 2016-03-25 MED ORDER — ABIRATERONE ACETATE 250 MG PO TABS: 1000.0000 mg | ORAL_TABLET | Freq: Every day | ORAL | 0 refills | Status: DC

## 2016-04-21 ENCOUNTER — Encounter: Payer: Self-pay | Admitting: *Deleted

## 2016-04-21 NOTE — Progress Notes (Signed)
This RN faxed paperwork back to Dr. Tawana Scale, DMD, regarding upcoming dental work. Fax # [phone].

## 2016-04-25 ENCOUNTER — Telehealth: Payer: Self-pay | Admitting: Oncology

## 2016-04-25 ENCOUNTER — Ambulatory Visit: Payer: Medicare Other

## 2016-04-25 ENCOUNTER — Ambulatory Visit (HOSPITAL_BASED_OUTPATIENT_CLINIC_OR_DEPARTMENT_OTHER): Payer: Medicare Other | Admitting: Oncology

## 2016-04-25 ENCOUNTER — Other Ambulatory Visit: Payer: Self-pay | Admitting: *Deleted

## 2016-04-25 ENCOUNTER — Other Ambulatory Visit (HOSPITAL_BASED_OUTPATIENT_CLINIC_OR_DEPARTMENT_OTHER): Payer: Medicare Other

## 2016-04-25 VITALS — BP 159/82 | HR 84 | Temp 98.6°F | Resp 18 | Ht 61.0 in | Wt 169.3 lb

## 2016-04-25 DIAGNOSIS — E291 Testicular hypofunction: Secondary | ICD-10-CM

## 2016-04-25 DIAGNOSIS — C61 Malignant neoplasm of prostate: Secondary | ICD-10-CM

## 2016-04-25 DIAGNOSIS — C7951 Secondary malignant neoplasm of bone: Secondary | ICD-10-CM | POA: Diagnosis not present

## 2016-04-25 LAB — COMPREHENSIVE METABOLIC PANEL
ALBUMIN: 3.4 g/dL — AB (ref 3.5–5.0)
ALK PHOS: 84 U/L (ref 40–150)
ALT: 9 U/L (ref 0–55)
AST: 21 U/L (ref 5–34)
Anion Gap: 9 mEq/L (ref 3–11)
BILIRUBIN TOTAL: 0.45 mg/dL (ref 0.20–1.20)
BUN: 13.7 mg/dL (ref 7.0–26.0)
CO2: 28 mEq/L (ref 22–29)
CREATININE: 0.9 mg/dL (ref 0.7–1.3)
Calcium: 9.5 mg/dL (ref 8.4–10.4)
Chloride: 106 mEq/L (ref 98–109)
GLUCOSE: 99 mg/dL (ref 70–140)
Potassium: 3.9 mEq/L (ref 3.5–5.1)
SODIUM: 143 meq/L (ref 136–145)
TOTAL PROTEIN: 7.1 g/dL (ref 6.4–8.3)

## 2016-04-25 LAB — CBC WITH DIFFERENTIAL/PLATELET
BASO%: 0.3 % (ref 0.0–2.0)
Basophils Absolute: 0 10*3/uL (ref 0.0–0.1)
EOS ABS: 0 10*3/uL (ref 0.0–0.5)
EOS%: 0.2 % (ref 0.0–7.0)
HCT: 40.3 % (ref 38.4–49.9)
HEMOGLOBIN: 12.9 g/dL — AB (ref 13.0–17.1)
LYMPH%: 21 % (ref 14.0–49.0)
MCH: 26.2 pg — ABNORMAL LOW (ref 27.2–33.4)
MCHC: 32.1 g/dL (ref 32.0–36.0)
MCV: 81.8 fL (ref 79.3–98.0)
MONO#: 0.5 10*3/uL (ref 0.1–0.9)
MONO%: 5.4 % (ref 0.0–14.0)
NEUT%: 73.1 % (ref 39.0–75.0)
NEUTROS ABS: 6.6 10*3/uL — AB (ref 1.5–6.5)
PLATELETS: 228 10*3/uL (ref 140–400)
RBC: 4.92 10*6/uL (ref 4.20–5.82)
RDW: 14.5 % (ref 11.0–14.6)
WBC: 9 10*3/uL (ref 4.0–10.3)
lymph#: 1.9 10*3/uL (ref 0.9–3.3)

## 2016-04-25 MED ORDER — PREDNISONE 5 MG PO TABS: ORAL_TABLET | ORAL | 2 refills | Status: DC

## 2016-04-25 NOTE — Progress Notes (Signed)
Hematology and Oncology Follow Up Visit  [name] Mathews [id number] [birth year] 70 y.o. 04/25/2016 3:12 PM    Principle Diagnosis:  70 year old gentleman with advanced prostate cancer. He has metastatic disease to the bone. He was inially diagnosed  2001, gleason score 4+5= 9.  Prior Therapy: 1. 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 He was started on hormone therapy, initially with Lupron.  2. Casodex was added to firmagon in 2009 because of PSA rise.  3. 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  Current therapy:  Zytiga 1000 mg po daily started in 09/2011 with prednisone 5 mg daily. Xgeva 120 mg started on 10/31/2011. This is given monthly.  Lupron 30 mg subcutaneously to be resumed on 06/14/2015 and every 4 months after that. Last injection given on 01/04/2016. This will be repeated in December 2017.  Interim History: [name] presents today for a follow up visit. Since the last visit, he reports no changes or complaints. He remains feeling very well. He continues to take as Zytiga without any complications. He has not reported any GI complications such as nausea or abdominal pain. Has not reported any change in his bowel habits. He has no issues obtained this medication at this time. He declined any fatigued and continues to attend activities of daily living.  He denied any other bone pain or pathological fractures. He does not report any complications related to [facility]. He is scheduled to have a dental procedure next few weeks and this medication will be on hold.  He does not report any headaches, blurry vision, syncope or seizures. He does not report any fevers or chills or sweats.  He has no shortness of breath, chest pain, cough, swelling, or palpitations. He denies  any nausea, vomiting or abdominal pain. He does not report any frequency urgency or hesitancy. The remainder of his detailed review of systems is otherwise unremarkable  Medications: I have reviewed the patient's current medications. Current Outpatient Prescriptions  Medication Sig Dispense Refill  . abiraterone Acetate (ZYTIGA) 250 MG tablet Take 4 tablets (1,000 mg total) by mouth daily. Take on an empty stomach 1 hour before or 2 hours after a meal 120 tablet 0  . atorvastatin (LIPITOR) 10 MG tablet Take 10 mg by mouth daily.    . Calcium Carbonate (CALTRATE 600 PO) Take 600 mg by mouth 3 (three) times daily.    [name] denosumab (XGEVA) 120 MG/1.7ML SOLN Inject 120 mg into the skin every 30 (thirty) days.    . DULoxetine (CYMBALTA) 60 MG capsule Take 60 mg by mouth daily.  2  . fish oil-omega-3 fatty acids 1000 MG capsule Take 1 g by mouth daily.    . meloxicam (MOBIC) 7.5 MG tablet   1  . rOPINIRole (REQUIP) 2 MG tablet Take 2 mg by mouth 2 (two) times daily.    . vitamin B-12 (CYANOCOBALAMIN) 1000 MCG tablet Take 1,000 mcg by mouth daily.    [name] EPIPEN 2-PAK 0.3 MG/0.3ML SOAJ injection Reported on 01/04/2016    . predniSONE (DELTASONE) 5 MG tablet TAKE 1 TABLET (5 MG TOTAL) BY MOUTH 2 (TWO) TIMES DAILY. 60 tablet 2   No current facility-administered medications for this visit.      Allergies:  Allergies  Allergen Reactions  . Bee Venom Anaphylaxis, Shortness Of Breath  and Swelling    Tongue swelling.   . Wasp Venom Anaphylaxis, Shortness Of Breath and Swelling    Tongue swelling   . Percocet [Oxycodone-Acetaminophen] Palpitations    His past medical history was reviewed today and is unchanged.    Physical Exam: Blood pressure (!) 159/82, pulse 84, temperature 98.6 F (37 C), temperature source Oral, resp. rate 18, height 5\' 1"  (1.549 m), weight 169 lb 4.8 oz (76.8 kg), SpO2 100 %. ECOG: 0 General appearance: Alert, awake gentleman without distress. Head: Normocephalic, without  obvious abnormality no oral thrush. Neck: no adenopathy thyroid masses. Lymph nodes: Cervical, supraclavicular, and axillary nodes normal. Heart:regular rate and rhythm, S1, S2 normal, no murmur, click, rub or gallop Lung:chest clear, no wheezing, rales, normal symmetric air entry Abdomen: soft, non-tender, without masses or organomegaly no rebound or guarding. EXT:no erythema, induration, or nodules Neurological examination: No motor or sensory deficits.   Lab Results: Lab Results  Component Value Date   WBC 9.0 04/25/2016   HGB 12.9 (L) 04/25/2016   HCT 40.3 04/25/2016   MCV 81.8 04/25/2016   PLT 228 04/25/2016     Chemistry      Component Value Date/Time   NA 143 03/14/2016 1536   K 4.0 03/14/2016 1536   CL 105 12/08/2012 1504   CO2 30 (H) 03/14/2016 1536   BUN 15.5 03/14/2016 1536   CREATININE 0.9 03/14/2016 1536      Component Value Date/Time   CALCIUM 9.4 03/14/2016 1536   ALKPHOS 72 03/14/2016 1536   AST 21 03/14/2016 1536   ALT 11 03/14/2016 1536   BILITOT 0.42 03/14/2016 1536       Results for VERLON, KILBOURN (MRN [id number]) as of 04/25/2016 14:47  Ref. Range 02/08/2016 15:11 03/14/2016 15:36  PSA Latest Ref Range: 0.0 - 4.0 ng/mL 14.2 (H) 16.5 (H)          Impression and Plan:   71 year old gentleman with the following issues:    1. 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 He has an elevated PSA up to 33 in May of 2013.   He is currently on Zytiga and have tolerated it well since 2013. He continues to tolerate this medication without any complications.   His PSA remains Under reasonable control fluctuated between 14 and 16. He has no objections to continue this current medication and different salvage therapy can be used in the future he develops symptomatic progression.   2. Bone directed therapy. On Xgeva monthly with calcium supplementation.Complications that includes hypocalcemia, osteonecrosis of the jaw among  others were reviewed. Given his upcoming dental procedure and elected to withhold therapy at this time and resume it in the future once his dental procedure done and well-healed.  3. Androgen deprivation: Last injection given in July 2017 and will be repeated in December 2017.  4. 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 Much better at this time and continues to be less of an issue.  5. Followup: in 4 to 6 weeks to follow-up.    Zola Button, MD 11/3/20173:12 PM

## 2016-04-25 NOTE — Telephone Encounter (Signed)
Appointments scheduled per 11/3 LOS. Patient given AVS report and calendar of future scheduled appointments.

## 2016-04-27 LAB — PSA: Prostate Specific Ag, Serum: 20 ng/mL — ABNORMAL HIGH (ref 0.0–4.0)

## 2016-05-01 DIAGNOSIS — G210 Malignant neuroleptic syndrome: Secondary | ICD-10-CM | POA: Diagnosis not present

## 2016-05-01 DIAGNOSIS — G609 Hereditary and idiopathic neuropathy, unspecified: Secondary | ICD-10-CM | POA: Diagnosis not present

## 2016-05-01 DIAGNOSIS — M5415 Radiculopathy, thoracolumbar region: Secondary | ICD-10-CM | POA: Diagnosis not present

## 2016-05-01 DIAGNOSIS — G2581 Restless legs syndrome: Secondary | ICD-10-CM | POA: Diagnosis not present

## 2016-05-22 ENCOUNTER — Telehealth: Payer: Self-pay | Admitting: Pharmacist

## 2016-05-22 NOTE — Telephone Encounter (Signed)
Oral Chemotherapy Pharmacist Encounter  Received phone call from [facility] that they have tried on multiple attempts to reach patient to schedule delivery of his next Zytiga fill.  I LVM for patient to call [facility] at his earliest convenience to schedule this delivery at [place]  Johny Drilling, PharmD, BCPS, BCOP 05/22/2016  4:29 PM [facility] [phone]

## 2016-05-28 NOTE — Telephone Encounter (Signed)
Spoke with patient. He has had issues with his phone. States he has his medication from [facility].

## 2016-06-06 ENCOUNTER — Ambulatory Visit (HOSPITAL_BASED_OUTPATIENT_CLINIC_OR_DEPARTMENT_OTHER): Payer: Medicare Other | Admitting: Oncology

## 2016-06-06 ENCOUNTER — Telehealth: Payer: Self-pay | Admitting: Oncology

## 2016-06-06 ENCOUNTER — Ambulatory Visit (HOSPITAL_BASED_OUTPATIENT_CLINIC_OR_DEPARTMENT_OTHER): Payer: Medicare Other

## 2016-06-06 ENCOUNTER — Other Ambulatory Visit (HOSPITAL_BASED_OUTPATIENT_CLINIC_OR_DEPARTMENT_OTHER): Payer: Medicare Other

## 2016-06-06 VITALS — BP 161/86 | HR 71 | Temp 98.7°F | Resp 18 | Ht 61.0 in | Wt 166.8 lb

## 2016-06-06 DIAGNOSIS — C61 Malignant neoplasm of prostate: Secondary | ICD-10-CM

## 2016-06-06 DIAGNOSIS — M545 Low back pain: Secondary | ICD-10-CM

## 2016-06-06 DIAGNOSIS — E291 Testicular hypofunction: Secondary | ICD-10-CM | POA: Diagnosis not present

## 2016-06-06 DIAGNOSIS — C7951 Secondary malignant neoplasm of bone: Secondary | ICD-10-CM

## 2016-06-06 DIAGNOSIS — Z5111 Encounter for antineoplastic chemotherapy: Secondary | ICD-10-CM

## 2016-06-06 DIAGNOSIS — Z23 Encounter for immunization: Secondary | ICD-10-CM

## 2016-06-06 LAB — CBC WITH DIFFERENTIAL/PLATELET
BASO%: 0.1 % (ref 0.0–2.0)
BASOS ABS: 0 10*3/uL (ref 0.0–0.1)
EOS%: 0.3 % (ref 0.0–7.0)
Eosinophils Absolute: 0 10*3/uL (ref 0.0–0.5)
HEMATOCRIT: 37.4 % — AB (ref 38.4–49.9)
HGB: 12.1 g/dL — ABNORMAL LOW (ref 13.0–17.1)
LYMPH#: 1.2 10*3/uL (ref 0.9–3.3)
LYMPH%: 15.9 % (ref 14.0–49.0)
MCH: 26.8 pg — AB (ref 27.2–33.4)
MCHC: 32.4 g/dL (ref 32.0–36.0)
MCV: 82.9 fL (ref 79.3–98.0)
MONO#: 0.4 10*3/uL (ref 0.1–0.9)
MONO%: 4.6 % (ref 0.0–14.0)
NEUT#: 6 10*3/uL (ref 1.5–6.5)
NEUT%: 79.1 % — AB (ref 39.0–75.0)
PLATELETS: 216 10*3/uL (ref 140–400)
RBC: 4.51 10*6/uL (ref 4.20–5.82)
RDW: 14.6 % (ref 11.0–14.6)
WBC: 7.6 10*3/uL (ref 4.0–10.3)

## 2016-06-06 LAB — COMPREHENSIVE METABOLIC PANEL
ALT: 11 U/L (ref 0–55)
ANION GAP: 8 meq/L (ref 3–11)
AST: 22 U/L (ref 5–34)
Albumin: 3.3 g/dL — ABNORMAL LOW (ref 3.5–5.0)
Alkaline Phosphatase: 84 U/L (ref 40–150)
BUN: 13.4 mg/dL (ref 7.0–26.0)
CALCIUM: 9.1 mg/dL (ref 8.4–10.4)
CHLORIDE: 108 meq/L (ref 98–109)
CO2: 28 meq/L (ref 22–29)
Creatinine: 0.8 mg/dL (ref 0.7–1.3)
Glucose: 120 mg/dl (ref 70–140)
POTASSIUM: 3.5 meq/L (ref 3.5–5.1)
Sodium: 144 mEq/L (ref 136–145)
Total Bilirubin: 0.5 mg/dL (ref 0.20–1.20)
Total Protein: 6.9 g/dL (ref 6.4–8.3)

## 2016-06-06 MED ORDER — LEUPROLIDE ACETATE (4 MONTH) 30 MG IM KIT
30.0000 mg | PACK | Freq: Once | INTRAMUSCULAR | Status: AC
  Administered 2016-06-06: 30 mg via INTRAMUSCULAR
  Filled 2016-06-06: qty 30

## 2016-06-06 MED ORDER — INFLUENZA VAC SPLIT QUAD 0.5 ML IM SUSY
0.5000 mL | PREFILLED_SYRINGE | Freq: Once | INTRAMUSCULAR | Status: AC
  Administered 2016-06-06: 0.5 mL via INTRAMUSCULAR
  Filled 2016-06-06: qty 0.5

## 2016-06-06 NOTE — Patient Instructions (Signed)
Leuprolide depot injection What is this medicine? LEUPROLIDE (loo PROE lide) is a man-made protein that acts like a natural hormone in the body. It decreases testosterone in men and decreases estrogen in women. In men, this medicine is used to treat advanced prostate cancer. In women, some forms of this medicine may be used to treat endometriosis, uterine fibroids, or other male hormone-related problems. This medicine may be used for other purposes; ask your health care provider or pharmacist if you have questions. COMMON BRAND NAME(S): Eligard, Lupron Depot, Lupron Depot-Ped, Viadur What should I tell my health care provider before I take this medicine? They need to know if you have any of these conditions: -diabetes -heart disease or previous heart attack -high blood pressure -high cholesterol -mental illness -osteoporosis -pain or difficulty passing urine -seizures -spinal cord metastasis -stroke -suicidal thoughts, plans, or attempt; a previous suicide attempt by you or a family member -tobacco smoker -unusual vaginal bleeding (women) -an unusual or allergic reaction to leuprolide, benzyl alcohol, other medicines, foods, dyes, or preservatives -pregnant or trying to get pregnant -breast-feeding How should I use this medicine? This medicine is for injection into a muscle or for injection under the skin. It is given by a health care professional in a hospital or clinic setting. The specific product will determine how it will be given to you. Make sure you understand which product you receive and how often you will receive it. Talk to your pediatrician regarding the use of this medicine in children. Special care may be needed. Overdosage: If you think you have taken too much of this medicine contact a poison control center or emergency room at once. NOTE: This medicine is only for you. Do not share this medicine with others. What if I miss a dose? It is important not to miss a dose.  Call your doctor or health care professional if you are unable to keep an appointment. Depot injections: Depot injections are given either once-monthly, every 12 weeks, every 16 weeks, or every 24 weeks depending on the product you are prescribed. The product you are prescribed will be based on if you are male or male, and your condition. Make sure you understand your product and dosing. What may interact with this medicine? Do not take this medicine with any of the following medications: -chasteberry This medicine may also interact with the following medications: -herbal or dietary supplements, like black cohosh or DHEA -male hormones, like estrogens or progestins and birth control pills, patches, rings, or injections -male hormones, like testosterone This list may not describe all possible interactions. Give your health care provider a list of all the medicines, herbs, non-prescription drugs, or dietary supplements you use. Also tell them if you smoke, drink alcohol, or use illegal drugs. Some items may interact with your medicine. What should I watch for while using this medicine? Visit your doctor or health care professional for regular checks on your progress. During the first weeks of treatment, your symptoms may get worse, but then will improve as you continue your treatment. You may get hot flashes, increased bone pain, increased difficulty passing urine, or an aggravation of nerve symptoms. Discuss these effects with your doctor or health care professional, some of them may improve with continued use of this medicine. Male patients may experience a menstrual cycle or spotting during the first months of therapy with this medicine. If this continues, contact your doctor or health care professional. What side effects may I notice from receiving this medicine? Side   effects that you should report to your doctor or health care professional as soon as possible: -allergic reactions like skin  rash, itching or hives, swelling of the face, lips, or tongue -breathing problems -chest pain -depression or memory disorders -pain in your legs or groin -pain at site where injected or implanted -severe headache -swelling of the feet and legs -visual changes -vomiting Side effects that usually do not require medical attention (report to your doctor or health care professional if they continue or are bothersome): -breast swelling or tenderness -decrease in sex drive or performance -diarrhea -hot flashes -loss of appetite -muscle, joint, or bone pains -nausea -redness or irritation at site where injected or implanted -skin problems or acne This list may not describe all possible side effects. Call your doctor for medical advice about side effects. You may report side effects to FDA at 1-800-FDA-1088. Where should I keep my medicine? This drug is given in a hospital or clinic and will not be stored at home. NOTE: This sheet is a summary. It may not cover all possible information. If you have questions about this medicine, talk to your doctor, pharmacist, or health care provider.  2017 Elsevier/Gold Standard (2015-11-22 09:45:17) Influenza Virus Vaccine (Flucelvax) What is this medicine? INFLUENZA VIRUS VACCINE (in floo EN zuh VAHY ruhs vak SEEN) helps to reduce the risk of getting influenza also known as the flu. The vaccine only helps protect you against some strains of the flu. COMMON BRAND NAME(S): FLUCELVAX What should I tell my health care provider before I take this medicine? They need to know if you have any of these conditions: -bleeding disorder like hemophilia -fever or infection -Guillain-Barre syndrome or other neurological problems -immune system problems -infection with the human immunodeficiency virus (HIV) or AIDS -low blood platelet counts -multiple sclerosis -an unusual or allergic reaction to influenza virus vaccine, other medicines, foods, dyes or  preservatives -pregnant or trying to get pregnant -breast-feeding How should I use this medicine? This vaccine is for injection into a muscle. It is given by a health care professional. A copy of Vaccine Information Statements will be given before each vaccination. Read this sheet carefully each time. The sheet may change frequently. Talk to your pediatrician regarding the use of this medicine in children. Special care may be needed. Overdosage: If you think you've taken too much of this medicine contact a poison control center or emergency room at once. What if I miss a dose? This does not apply. What may interact with this medicine? -chemotherapy or radiation therapy -medicines that lower your immune system like etanercept, anakinra, infliximab, and adalimumab -medicines that treat or prevent blood clots like warfarin -phenytoin -steroid medicines like prednisone or cortisone -theophylline -vaccines What should I watch for while using this medicine? Report any side effects that do not go away within 3 days to your doctor or health care professional. Call your health care provider if any unusual symptoms occur within 6 weeks of receiving this vaccine. You may still catch the flu, but the illness is not usually as bad. You cannot get the flu from the vaccine. The vaccine will not protect against colds or other illnesses that may cause fever. The vaccine is needed every year. What side effects may I notice from receiving this medicine? Side effects that you should report to your doctor or health care professional as soon as possible: -allergic reactions like skin rash, itching or hives, swelling of the face, lips, or tongue Side effects that usually do not  require medical attention (Report these to your doctor or health care professional if they continue or are bothersome.): -fever -headache -muscle aches and pains -pain, tenderness, redness, or swelling at the injection  site -tiredness Where should I keep my medicine? The vaccine will be given by a health care professional in a clinic, pharmacy, doctor's office, or other health care setting. You will not be given vaccine doses to store at home.  2017 Elsevier/Gold Standard (2011-05-21 14:06:47)

## 2016-06-06 NOTE — Telephone Encounter (Signed)
Appointments scheduled per 12/15 LOS. Patient given AVS report and calendars with future scheduled appointments. °

## 2016-06-06 NOTE — Progress Notes (Signed)
Hematology and Oncology Follow Up Visit  [name] Mathews [id number] Aug 03, 1945 70 y.o. 06/06/2016 1:42 PM    Principle Diagnosis: 70 year old gentleman with advanced prostate cancer. He has metastatic disease to the bone. He was inially diagnosed  2001, gleason score 4+5= 9.  Prior Therapy: 1. 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 He was started on hormone therapy, initially with Lupron.  2. Casodex was added to firmagon in 2009 because of PSA rise.  3. 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  Current therapy:  Zytiga 1000 mg po daily started in 09/2011 with prednisone 5 mg daily. Xgeva 120 mg started on 10/31/2011. This is given monthly.  Lupron 30 mg subcutaneously to be resumed on 06/14/2015 and every 4 months after that. Last injection given on 01/04/2016. This will be repeated in December 2017.  Interim History: [name] Mathews presents today for a follow up visit. Since the last visit, he reports continuing to do very well without any recent illnesses or hospitalizations. He denied any recent back pain, bone pain or pathological fractures. He continues to take as Zytiga without any complications. He has not reported any GI complications such as nausea or abdominal pain. Has not reported any change in his bowel habits.   He continues to enjoy excellent quality of life without any decline in his energy or performance status. He is planning to move to [facility] to be close with his family. He is in the process of moving in the next few weeks.[name]  He does not report any headaches, blurry vision, syncope or seizures. He does not report any fevers or chills or sweats.  He has no shortness of breath, chest pain, cough, swelling, or palpitations. He denies any nausea, vomiting or  abdominal pain. He does not report any frequency urgency or hesitancy. The remainder of his detailed review of systems is otherwise unremarkable  Medications: I have reviewed the patient's current medications. Current Outpatient Prescriptions  Medication Sig Dispense Refill  . abiraterone Acetate (ZYTIGA) 250 MG tablet Take 4 tablets (1,000 mg total) by mouth daily. Take on an empty stomach 1 hour before or 2 hours after a meal 120 tablet 0  . atorvastatin (LIPITOR) 10 MG tablet Take 10 mg by mouth daily.    . Calcium Carbonate (CALTRATE 600 PO) Take 600 mg by mouth 3 (three) times daily.    [name] denosumab (XGEVA) 120 MG/1.7ML SOLN Inject 120 mg into the skin every 30 (thirty) days.    . DULoxetine (CYMBALTA) 60 MG capsule Take 60 mg by mouth daily.  2  . EPIPEN 2-PAK 0.3 MG/0.3ML SOAJ injection Reported on 01/04/2016    . fish oil-omega-3 fatty acids 1000 MG capsule Take 1 g by mouth daily.    . meloxicam (MOBIC) 7.5 MG tablet   1  . predniSONE (DELTASONE) 5 MG tablet TAKE 1 TABLET (5 MG TOTAL) BY MOUTH 2 (TWO) TIMES DAILY. 60 tablet 2  . rOPINIRole (REQUIP) 2 MG tablet Take 2 mg by mouth 2 (two) times daily.    . vitamin B-12 (CYANOCOBALAMIN) 1000 MCG tablet Take 1,000 mcg by mouth daily.     No current facility-administered medications for this visit.      Allergies:  Allergies  Allergen Reactions  . Bee Venom Anaphylaxis, Shortness Of Breath and Swelling  Tongue swelling.   . Wasp Venom Anaphylaxis, Shortness Of Breath and Swelling    Tongue swelling   . Percocet [Oxycodone-Acetaminophen] Palpitations    His past medical history was reviewed today and is unchanged.    Physical Exam: Blood pressure (!) 161/86, pulse 71, temperature 98.7 F (37.1 C), temperature source Oral, resp. rate 18, height 5\' 1"  (1.549 m), weight 166 lb 12.8 oz (75.7 kg), SpO2 99 %. ECOG: 0 General appearance: Well-appearing gentleman appeared without distress. Head: Normocephalic, without obvious  abnormality no oral ulcers or lesions. Neck: no adenopathy thyroid masses. Lymph nodes: Cervical, supraclavicular, and axillary nodes normal. Heart:regular rate and rhythm, S1, S2 normal, no murmur, click, rub or gallop Lung:chest clear, no wheezing, rales, normal symmetric air entry Abdomen: soft, non-tender, without masses or organomegaly no shifting dullness or ascites. EXT:no erythema, induration, or nodules Neurological examination: No motor or sensory deficits.   Lab Results: Lab Results  Component Value Date   WBC 7.6 06/06/2016   HGB 12.1 (L) 06/06/2016   HCT 37.4 (L) 06/06/2016   MCV 82.9 06/06/2016   PLT 216 06/06/2016     Chemistry      Component Value Date/Time   NA 143 04/25/2016 1446   K 3.9 04/25/2016 1446   CL 105 12/08/2012 1504   CO2 28 04/25/2016 1446   BUN 13.7 04/25/2016 1446   CREATININE 0.9 04/25/2016 1446      Component Value Date/Time   CALCIUM 9.5 04/25/2016 1446   ALKPHOS 84 04/25/2016 1446   AST 21 04/25/2016 1446   ALT 9 04/25/2016 1446   BILITOT 0.45 04/25/2016 1446        Results for [name] Mathews (MRN [id number]) as of 06/06/2016 13:17  Ref. Range 01/04/2016 12:29 02/08/2016 15:11 03/14/2016 15:36 04/25/2016 14:46  PSA Latest Ref Range: 0.0 - 4.0 ng/mL 14.0 (H) 14.2 (H) 16.5 (H) 20.0 (H)          Impression and Plan:   70 year old gentleman with the following issues:    1. 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 He has an elevated PSA up to 33 in May of 2013.   He is currently on Zytiga and have tolerated it well since 2013. He continues to tolerate this medication without any complications.   His PSA Did show slight increase but overall has been relatively stable. The plan is to repeat a bone scan in January 2018 and use a different salvage therapy if he develops symptomatic progression or radiographic evidence of high volume disease.   2. Bone directed therapy. On Xgeva monthly with calcium  supplementation.[name] Mathews is on hold for anticipated dental work.  3. Androgen deprivation: Last injection given in July 2017 and will be repeated on June 06 2016. This will be repeated again in 4 months.  4. 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 Much better at this time and continues to be less of an issue.  5. Followup: in 4 to 6 weeks to follow-up.    Zola Button, MD 12/15/20171:42 PM

## 2016-06-07 LAB — TESTOSTERONE

## 2016-06-07 LAB — PSA: Prostate Specific Ag, Serum: 23.6 ng/mL — ABNORMAL HIGH (ref 0.0–4.0)

## 2016-07-15 ENCOUNTER — Other Ambulatory Visit: Payer: Medicare Other

## 2016-07-15 ENCOUNTER — Encounter (HOSPITAL_COMMUNITY)
Admission: RE | Admit: 2016-07-15 | Discharge: 2016-07-15 | Disposition: A | Discharge status: Home / Self Care | Payer: Medicare Other | Source: Ambulatory Visit | Attending: Oncology | Admitting: Oncology

## 2016-07-15 DIAGNOSIS — C61 Malignant neoplasm of prostate: Secondary | ICD-10-CM | POA: Diagnosis not present

## 2016-07-15 DIAGNOSIS — C7951 Secondary malignant neoplasm of bone: Secondary | ICD-10-CM | POA: Diagnosis not present

## 2016-07-15 LAB — COMPREHENSIVE METABOLIC PANEL
ALBUMIN: 3.2 g/dL — AB (ref 3.5–5.0)
ALT: 8 U/L (ref 0–55)
AST: 20 U/L (ref 5–34)
Alkaline Phosphatase: 77 U/L (ref 40–150)
Anion Gap: 7 mEq/L (ref 3–11)
BUN: 11.3 mg/dL (ref 7.0–26.0)
CHLORIDE: 106 meq/L (ref 98–109)
CO2: 29 meq/L (ref 22–29)
Calcium: 8.8 mg/dL (ref 8.4–10.4)
Creatinine: 0.8 mg/dL (ref 0.7–1.3)
EGFR: >90 mL/min/{1.73_m2} (ref >90)
GLUCOSE: 102 mg/dL (ref 70–140)
POTASSIUM: 4 meq/L (ref 3.5–5.1)
SODIUM: 142 meq/L (ref 136–145)
Total Bilirubin: 0.38 mg/dL (ref 0.20–1.20)
Total Protein: 6.4 g/dL (ref 6.4–8.3)

## 2016-07-15 LAB — CBC WITH DIFFERENTIAL/PLATELET
BASO%: 0.3 % (ref 0.0–2.0)
BASOS ABS: 0 10*3/uL (ref 0.0–0.1)
EOS%: 2.4 % (ref 0.0–7.0)
Eosinophils Absolute: 0.2 10*3/uL (ref 0.0–0.5)
HCT: 36.7 % — ABNORMAL LOW (ref 38.4–49.9)
HEMOGLOBIN: 11.9 g/dL — AB (ref 13.0–17.1)
LYMPH%: 25.3 % (ref 14.0–49.0)
MCH: 26.8 pg — AB (ref 27.2–33.4)
MCHC: 32.4 g/dL (ref 32.0–36.0)
MCV: 82.8 fL (ref 79.3–98.0)
MONO#: 0.5 10*3/uL (ref 0.1–0.9)
MONO%: 6.9 % (ref 0.0–14.0)
NEUT#: 4.5 10*3/uL (ref 1.5–6.5)
NEUT%: 65.1 % (ref 39.0–75.0)
Platelets: 198 10*3/uL (ref 140–400)
RBC: 4.43 10*6/uL (ref 4.20–5.82)
RDW: 14.7 % — AB (ref 11.0–14.6)
WBC: 6.9 10*3/uL (ref 4.0–10.3)
lymph#: 1.8 10*3/uL (ref 0.9–3.3)

## 2016-07-15 MED ORDER — TECHNETIUM TC 99M MEDRONATE IV KIT: 25.0000 | PACK | Freq: Once | INTRAVENOUS | Status: DC | PRN

## 2016-07-16 ENCOUNTER — Telehealth: Payer: Self-pay | Admitting: Oncology

## 2016-07-16 ENCOUNTER — Ambulatory Visit (HOSPITAL_BASED_OUTPATIENT_CLINIC_OR_DEPARTMENT_OTHER): Payer: Medicare Other | Admitting: Oncology

## 2016-07-16 ENCOUNTER — Other Ambulatory Visit: Payer: Self-pay | Admitting: *Deleted

## 2016-07-16 DIAGNOSIS — C61 Malignant neoplasm of prostate: Secondary | ICD-10-CM | POA: Diagnosis not present

## 2016-07-16 DIAGNOSIS — E291 Testicular hypofunction: Secondary | ICD-10-CM

## 2016-07-16 DIAGNOSIS — C7951 Secondary malignant neoplasm of bone: Secondary | ICD-10-CM | POA: Diagnosis not present

## 2016-07-16 LAB — PSA: Prostate Specific Ag, Serum: 23.5 ng/mL — ABNORMAL HIGH (ref 0.0–4.0)

## 2016-07-16 MED ORDER — ABIRATERONE ACETATE 250 MG PO TABS: 1000.0000 mg | ORAL_TABLET | Freq: Every day | ORAL | 0 refills | Status: DC

## 2016-07-16 NOTE — Progress Notes (Signed)
Hematology and Oncology Follow Up Visit  [name] Mathews [id number] June 19, 1946 71 y.o. 07/16/2016 10:48 AM    Principle Diagnosis: 71 year old gentleman with advanced prostate cancer. He has metastatic disease to the bone. He was inially diagnosed  2001, gleason score 4+5= 9.  Prior Therapy: 1. 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 He was started on hormone therapy, initially with Lupron.  2. Casodex was added to firmagon in 2009 because of PSA rise.  3. 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  Current therapy:  Zytiga 1000 mg po daily started in 09/2011 with prednisone 5 mg daily. Xgeva 120 mg started on 10/31/2011. This is given monthly.  Lupron 30 mg subcutaneously to be resumed on 06/14/2015 and every 4 months after that. Last injection given on 01/04/2016. This will be repeated in December 2017.  Interim History: [name] Mathews presents today for a follow up visit. Since the last visit, he reports no changes in his health. He currently resides in [place] after selling his house to be close to his children. He denied any recent back pain, bone pain or pathological fractures. He continues to take as Zytiga without any complications. He has not reported any GI complications such as nausea or abdominal pain. Has not reported any change in his bowel habits. His performance status and quality of life has not dramatically changed. He denied any issues pertaining Zytiga or taking it on a regular basis. He denied missing any doses.  He does not report any headaches, blurry vision, syncope or seizures. He does not report any fevers or chills or sweats.  He has no shortness of breath, chest pain, cough, swelling, or palpitations. He denies any nausea, vomiting or abdominal pain. He  does not report any frequency urgency or hesitancy. The remainder of his detailed review of systems is otherwise unremarkable  Medications: I have reviewed the patient's current medications. Current Outpatient Prescriptions  Medication Sig Dispense Refill  . abiraterone Acetate (ZYTIGA) 250 MG tablet Take 4 tablets (1,000 mg total) by mouth daily. Take on an empty stomach 1 hour before or 2 hours after a meal 120 tablet 0  . atorvastatin (LIPITOR) 10 MG tablet Take 10 mg by mouth daily.    . Calcium Carbonate (CALTRATE 600 PO) Take 600 mg by mouth 3 (three) times daily.    [name] denosumab (XGEVA) 120 MG/1.7ML SOLN Inject 120 mg into the skin every 30 (thirty) days.    . DULoxetine (CYMBALTA) 60 MG capsule Take 60 mg by mouth daily.  2  . EPIPEN 2-PAK 0.3 MG/0.3ML SOAJ injection Reported on 01/04/2016    . fish oil-omega-3 fatty acids 1000 MG capsule Take 1 g by mouth daily.    . meloxicam (MOBIC) 7.5 MG tablet   1  . predniSONE (DELTASONE) 5 MG tablet TAKE 1 TABLET (5 MG TOTAL) BY MOUTH 2 (TWO) TIMES DAILY. 60 tablet 2  . rOPINIRole (REQUIP) 2 MG tablet Take 2 mg by mouth 2 (two) times daily.    . vitamin B-12 (CYANOCOBALAMIN) 1000 MCG tablet Take 1,000 mcg by mouth daily.     No current facility-administered medications for this visit.    Facility-Administered Medications Ordered in Other Visits  Medication Dose Route Frequency Provider Last Rate Last Dose  . technetium medronate (TC-MDP) injection 25 millicurie  25 millicurie Intravenous Once PRN Julian Hy, MD         Allergies:  Allergies  Allergen Reactions  . Bee Venom Anaphylaxis, Shortness Of Breath and Swelling    Tongue swelling.   . Wasp Venom Anaphylaxis, Shortness Of Breath and Swelling    Tongue swelling   . Percocet [Oxycodone-Acetaminophen] Palpitations    His past medical history was reviewed today and is unchanged.    Physical Exam: There were no vitals taken for this visit. ECOG: 0 General appearance:  Alert, awake gentleman without distress. Head: Normocephalic, without obvious abnormality no oral thrush noted. Neck: no adenopathy or thyromegaly. Lymph nodes: Cervical, supraclavicular, and axillary nodes normal. Heart:regular rate and rhythm, S1, S2 normal, no murmur, click, rub or gallop Lung:chest clear, no wheezing, rales, normal symmetric air entry Abdomen: soft, non-tender, without masses or organomegaly no rebound or guarding. EXT:no erythema, induration, or nodules Neurological examination: No motor or sensory deficits.   Lab Results: Lab Results  Component Value Date   WBC 6.9 07/15/2016   HGB 11.9 (L) 07/15/2016   HCT 36.7 (L) 07/15/2016   MCV 82.8 07/15/2016   PLT 198 07/15/2016     Chemistry      Component Value Date/Time   NA 142 07/15/2016 1048   K 4.0 07/15/2016 1048   CL 105 12/08/2012 1504   CO2 29 07/15/2016 1048   BUN 11.3 07/15/2016 1048   CREATININE 0.8 07/15/2016 1048      Component Value Date/Time   CALCIUM 8.8 07/15/2016 1048   ALKPHOS 77 07/15/2016 1048   AST 20 07/15/2016 1048   ALT 8 07/15/2016 1048   BILITOT 0.38 07/15/2016 1048      Results for [name] Mathews (MRN [id number]) as of 07/16/2016 10:50  Ref. Range 04/25/2016 14:46 06/06/2016 13:03 07/15/2016 10:48  PSA Latest Ref Range: 0.0 - 4.0 ng/mL 20.0 (H) 23.6 (H) 23.5 (H)    IMPRESSION: 1. Previously identified right posterior parietal/occipital central focal area of increased activity has decreased. However interim increase in right posterior parietal activity noted suggesting new metastatic foci in the skull .  2. Mild increased activity noted throughout the cervical, thoracic, and lumbar spine suggesting slight progression of metastatic disease  3. Intense increased activity mid thoracic spine is unchanged. Intense increased activity of the left sternoclavicular region is unchanged. Activity in the right facial region is unchanged.        Impression and Plan:    71 year old gentleman with the following issues:    1. 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 He has an elevated PSA up to 33 in May of 2013.   He is currently on Zytiga and have tolerated it well since 2013. He continues to tolerate this medication without any complications.   His PSA is showing very slow rise without any signs or symptoms of progression of disease. His bone scan on January 20 13,018 was personally reviewed and appears to be stable. The plan is to continue with the same dose and schedule and used for salvage therapy upon symptomatic progression.   2. Bone directed therapy. On Xgeva monthly with calcium supplementation.Delton See is on hold for anticipated dental work. This will be resumed with his next appointment in March 2018.  3. Androgen deprivation: Last injection given 09/05/2015. This will be repeated again in 4 months.  4. 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 Much better at this time and continues to be less of  an issue.  5. Followup: in 4 to 6 weeks to follow-up.    Zola Button, MD 1/24/201810:48 AM

## 2016-07-16 NOTE — Telephone Encounter (Signed)
Labs added for today, per 07/16/16 los. Appointments scheduled per 07/16/16 los. Patient was given a copy of the appointment schedule and AVS report, per 07/16/16 los.

## 2016-08-14 ENCOUNTER — Other Ambulatory Visit: Payer: Self-pay | Admitting: *Deleted

## 2016-08-14 DIAGNOSIS — C61 Malignant neoplasm of prostate: Secondary | ICD-10-CM

## 2016-08-14 MED ORDER — ABIRATERONE ACETATE 250 MG PO TABS: 1000.0000 mg | ORAL_TABLET | Freq: Every day | ORAL | 0 refills | Status: DC

## 2016-08-21 ENCOUNTER — Other Ambulatory Visit (HOSPITAL_BASED_OUTPATIENT_CLINIC_OR_DEPARTMENT_OTHER): Payer: Medicare Other

## 2016-08-21 ENCOUNTER — Other Ambulatory Visit: Payer: Self-pay | Admitting: *Deleted

## 2016-08-21 ENCOUNTER — Telehealth: Payer: Self-pay | Admitting: Oncology

## 2016-08-21 ENCOUNTER — Ambulatory Visit (HOSPITAL_BASED_OUTPATIENT_CLINIC_OR_DEPARTMENT_OTHER): Payer: Medicare Other

## 2016-08-21 ENCOUNTER — Ambulatory Visit (HOSPITAL_BASED_OUTPATIENT_CLINIC_OR_DEPARTMENT_OTHER): Payer: Medicare Other | Admitting: Oncology

## 2016-08-21 VITALS — BP 155/81 | HR 64 | Temp 98.9°F | Resp 17 | Ht 61.0 in | Wt 166.3 lb

## 2016-08-21 DIAGNOSIS — C7951 Secondary malignant neoplasm of bone: Secondary | ICD-10-CM | POA: Diagnosis not present

## 2016-08-21 DIAGNOSIS — E291 Testicular hypofunction: Secondary | ICD-10-CM | POA: Diagnosis not present

## 2016-08-21 DIAGNOSIS — C61 Malignant neoplasm of prostate: Secondary | ICD-10-CM | POA: Diagnosis not present

## 2016-08-21 LAB — CBC WITH DIFFERENTIAL/PLATELET
BASO%: 0.3 % (ref 0.0–2.0)
BASOS ABS: 0 10*3/uL (ref 0.0–0.1)
EOS ABS: 0.2 10*3/uL (ref 0.0–0.5)
EOS%: 2 % (ref 0.0–7.0)
HCT: 39 % (ref 38.4–49.9)
HEMOGLOBIN: 12.4 g/dL — AB (ref 13.0–17.1)
LYMPH%: 28.4 % (ref 14.0–49.0)
MCH: 27.1 pg — AB (ref 27.2–33.4)
MCHC: 31.8 g/dL — ABNORMAL LOW (ref 32.0–36.0)
MCV: 85.3 fL (ref 79.3–98.0)
MONO#: 0.5 10*3/uL (ref 0.1–0.9)
MONO%: 6.8 % (ref 0.0–14.0)
NEUT#: 4.7 10*3/uL (ref 1.5–6.5)
NEUT%: 62.5 % (ref 39.0–75.0)
Platelets: 205 10*3/uL (ref 140–400)
RBC: 4.57 10*6/uL (ref 4.20–5.82)
RDW: 14.3 % (ref 11.0–14.6)
WBC: 7.5 10*3/uL (ref 4.0–10.3)
lymph#: 2.1 10*3/uL (ref 0.9–3.3)

## 2016-08-21 LAB — COMPREHENSIVE METABOLIC PANEL
ALBUMIN: 3.3 g/dL — AB (ref 3.5–5.0)
ALK PHOS: 97 U/L (ref 40–150)
ALT: 9 U/L (ref 0–55)
AST: 16 U/L (ref 5–34)
Anion Gap: 9 mEq/L (ref 3–11)
BUN: 13.3 mg/dL (ref 7.0–26.0)
CO2: 30 mEq/L — ABNORMAL HIGH (ref 22–29)
Calcium: 9.4 mg/dL (ref 8.4–10.4)
Chloride: 105 mEq/L (ref 98–109)
Creatinine: 0.8 mg/dL (ref 0.7–1.3)
GLUCOSE: 107 mg/dL (ref 70–140)
POTASSIUM: 4 meq/L (ref 3.5–5.1)
SODIUM: 144 meq/L (ref 136–145)
Total Bilirubin: 0.22 mg/dL (ref 0.20–1.20)
Total Protein: 6.7 g/dL (ref 6.4–8.3)

## 2016-08-21 MED ORDER — DENOSUMAB 120 MG/1.7ML ~~LOC~~ SOLN
120.0000 mg | Freq: Once | SUBCUTANEOUS | Status: AC
  Administered 2016-08-21: 120 mg via SUBCUTANEOUS
  Filled 2016-08-21: qty 1.7

## 2016-08-21 MED ORDER — PREDNISONE 5 MG PO TABS: ORAL_TABLET | ORAL | 2 refills | Status: DC

## 2016-08-21 NOTE — Telephone Encounter (Signed)
Appointments scheduled per 3/1 LOS.  Patient given AVS report and calendars with future scheduled appointments. °

## 2016-08-21 NOTE — Patient Instructions (Signed)
Denosumab injection  What is this medicine?  DENOSUMAB (den oh sue mab) slows bone breakdown. Prolia is used to treat osteoporosis in women after menopause and in men. Xgeva is used to prevent bone fractures and other bone problems caused by cancer bone metastases. Xgeva is also used to treat giant cell tumor of the bone.  This medicine may be used for other purposes; ask your health care provider or pharmacist if you have questions.  What should I tell my health care provider before I take this medicine?  They need to know if you have any of these conditions:  -dental disease  -eczema  -infection or history of infections  -kidney disease or on dialysis  -low blood calcium or vitamin D  -malabsorption syndrome  -scheduled to have surgery or tooth extraction  -taking medicine that contains denosumab  -thyroid or parathyroid disease  -an unusual reaction to denosumab, other medicines, foods, dyes, or preservatives  -pregnant or trying to get pregnant  -breast-feeding  How should I use this medicine?  This medicine is for injection under the skin. It is given by a health care professional in a hospital or clinic setting.  If you are getting Prolia, a special MedGuide will be given to you by the pharmacist with each prescription and refill. Be sure to read this information carefully each time.  For Prolia, talk to your pediatrician regarding the use of this medicine in children. Special care may be needed. For Xgeva, talk to your pediatrician regarding the use of this medicine in children. While this drug may be prescribed for children as young as 13 years for selected conditions, precautions do apply.  Overdosage: If you think you have taken too much of this medicine contact a poison control center or emergency room at once.  NOTE: This medicine is only for you. Do not share this medicine with others.  What if I miss a dose?  It is important not to miss your dose. Call your doctor or health care professional if you are  unable to keep an appointment.  What may interact with this medicine?  Do not take this medicine with any of the following medications:  -other medicines containing denosumab  This medicine may also interact with the following medications:  -medicines that suppress the immune system  -medicines that treat cancer  -steroid medicines like prednisone or cortisone  This list may not describe all possible interactions. Give your health care provider a list of all the medicines, herbs, non-prescription drugs, or dietary supplements you use. Also tell them if you smoke, drink alcohol, or use illegal drugs. Some items may interact with your medicine.  What should I watch for while using this medicine?  Visit your doctor or health care professional for regular checks on your progress. Your doctor or health care professional may order blood tests and other tests to see how you are doing.  Call your doctor or health care professional if you get a cold or other infection while receiving this medicine. Do not treat yourself. This medicine may decrease your body's ability to fight infection.  You should make sure you get enough calcium and vitamin D while you are taking this medicine, unless your doctor tells you not to. Discuss the foods you eat and the vitamins you take with your health care professional.  See your dentist regularly. Brush and floss your teeth as directed. Before you have any dental work done, tell your dentist you are receiving this medicine.  Do   not become pregnant while taking this medicine or for 5 months after stopping it. Women should inform their doctor if they wish to become pregnant or think they might be pregnant. There is a potential for serious side effects to an unborn child. Talk to your health care professional or pharmacist for more information.  What side effects may I notice from receiving this medicine?  Side effects that you should report to your doctor or health care professional as soon as  possible:  -allergic reactions like skin rash, itching or hives, swelling of the face, lips, or tongue  -breathing problems  -chest pain  -fast, irregular heartbeat  -feeling faint or lightheaded, falls  -fever, chills, or any other sign of infection  -muscle spasms, tightening, or twitches  -numbness or tingling  -skin blisters or bumps, or is dry, peels, or red  -slow healing or unexplained pain in the mouth or jaw  -unusual bleeding or bruising  Side effects that usually do not require medical attention (Report these to your doctor or health care professional if they continue or are bothersome.):  -muscle pain  -stomach upset, gas  This list may not describe all possible side effects. Call your doctor for medical advice about side effects. You may report side effects to FDA at 1-800-FDA-1088.  Where should I keep my medicine?  This medicine is only given in a clinic, doctor's office, or other health care setting and will not be stored at home.  NOTE: This sheet is a summary. It may not cover all possible information. If you have questions about this medicine, talk to your doctor, pharmacist, or health care provider.      2016, Elsevier/Gold Standard. (2011-12-08 12:37:47)

## 2016-08-21 NOTE — Progress Notes (Signed)
Hematology and Oncology Follow Up Visit  PINCHAS DECLEENE [id number] 1945-11-24 71 y.o. 08/21/2016 8:52 AM    Principle Diagnosis: 71 year old gentleman with advanced prostate cancer. He has metastatic disease to the bone. He was inially diagnosed  2001, gleason score 4+5= 9.  Prior Therapy: 1. 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 He was started on hormone therapy, initially with Lupron.  2. Casodex was added to firmagon in 2009 because of PSA rise.  3. 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  Current therapy:  Zytiga 1000 mg po daily started in 09/2011 with prednisone 5 mg daily. Xgeva 120 mg started on 10/31/2011. This is given monthly.  Lupron 30 mg subcutaneously to be resumed on 06/14/2015 and every 4 months after that. Last injection given on 01/04/2016. This will be repeated in December 2017.  Interim History: Mr. Markum presents today for a follow up visit. Since the last visit, he reports feeling well without any major complaints. He does report occasional aches in his legs especially at nighttime. He is able to rest comfortably and exercise regularly. He denied any pain, neuropathy or any discomfort while he is active. He denied any recent back pain, bone pain or pathological fractures.   He continues to take as Zytiga without any complications. He has not reported any GI complications such as nausea or abdominal pain. Has not reported any change in his bowel habits. His performance status and quality of life has not dramatically changed.   He does not report any headaches, blurry vision, syncope or seizures. He does not report any fevers or chills or sweats.  He has no shortness of breath, chest pain, cough, swelling, or palpitations. He denies any nausea,  vomiting or abdominal pain. He does not report any frequency urgency or hesitancy. The remainder of his detailed review of systems is otherwise unremarkable  Medications: I have reviewed the patient's current medications. Current Outpatient Prescriptions  Medication Sig Dispense Refill  . abiraterone Acetate (ZYTIGA) 250 MG tablet Take 4 tablets (1,000 mg total) by mouth daily. Take on an empty stomach 1 hour before or 2 hours after a meal 120 tablet 0  . atorvastatin (LIPITOR) 10 MG tablet Take 10 mg by mouth daily.    . Calcium Carbonate (CALTRATE 600 PO) Take 600 mg by mouth 3 (three) times daily.    [name] denosumab (XGEVA) 120 MG/1.7ML SOLN Inject 120 mg into the skin every 30 (thirty) days.    . DULoxetine (CYMBALTA) 60 MG capsule Take 60 mg by mouth daily.  2  . EPIPEN 2-PAK 0.3 MG/0.3ML SOAJ injection Reported on 01/04/2016    . fish oil-omega-3 fatty acids 1000 MG capsule Take 1 g by mouth daily.    . meloxicam (MOBIC) 7.5 MG tablet   1  . predniSONE (DELTASONE) 5 MG tablet TAKE 1 TABLET (5 MG TOTAL) BY MOUTH 2 (TWO) TIMES DAILY. 60 tablet 2  . rOPINIRole (REQUIP) 2 MG tablet Take 2 mg by mouth 2 (two) times daily.    . vitamin B-12 (CYANOCOBALAMIN) 1000 MCG tablet Take 1,000 mcg by mouth daily.     No current facility-administered medications for this visit.      Allergies:  Allergies  Allergen Reactions  . Bee Venom Anaphylaxis, Shortness Of Breath and Swelling    Tongue  swelling.   . Wasp Venom Anaphylaxis, Shortness Of Breath and Swelling    Tongue swelling   . Percocet [Oxycodone-Acetaminophen] Palpitations    His past medical history was reviewed today and is unchanged.    Physical Exam: Blood pressure (!) 155/81, pulse 64, temperature 98.9 F (37.2 C), temperature source Oral, resp. rate 17, height 5\' 1"  (1.549 m), weight 166 lb 4.8 oz (75.4 kg), SpO2 96 %. ECOG: 0 General appearance: Well-appearing gentleman without distress. Head: Normocephalic, without obvious  abnormality no oral ulcers or lesions. Neck: no adenopathy or thyromegaly. Lymph nodes: Cervical, supraclavicular, and axillary nodes normal. Heart:regular rate and rhythm, S1, S2 normal, no murmur, click, rub or gallop Lung:chest clear, no wheezing, rales, normal symmetric air entry Abdomen: soft, non-tender, without masses or organomegaly no shifting dullness or ascites. EXT:no erythema, induration, or nodules Neurological examination: No motor or sensory deficits.   Lab Results: Lab Results  Component Value Date   WBC 7.5 08/21/2016   HGB 12.4 (L) 08/21/2016   HCT 39.0 08/21/2016   MCV 85.3 08/21/2016   PLT 205 08/21/2016     Chemistry      Component Value Date/Time   NA 142 07/15/2016 1048   K 4.0 07/15/2016 1048   CL 105 12/08/2012 1504   CO2 29 07/15/2016 1048   BUN 11.3 07/15/2016 1048   CREATININE 0.8 07/15/2016 1048      Component Value Date/Time   CALCIUM 8.8 07/15/2016 1048   ALKPHOS 77 07/15/2016 1048   AST 20 07/15/2016 1048   ALT 8 07/15/2016 1048   BILITOT 0.38 07/15/2016 1048      Results for DERRAN, REI (MRN [id number]) as of 08/21/2016 08:38  Ref. Range 04/25/2016 14:46 06/06/2016 13:03 07/15/2016 10:48  PSA Latest Ref Range: 0.0 - 4.0 ng/mL 20.0 (H) 23.6 (H) 23.5 (H)        Impression and Plan:   71 year old gentleman with the following issues:    1. 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 He has an elevated PSA up to 33 in May of 2013.   He is currently on Zytiga and have tolerated it well since 2013. He continues to tolerate this medication without any complications.   His PSA continues to be relatively stable without any major changes. Bone scan in January 2018 continue to show stable disease.  Risks and benefits of continuing this medication were reviewed today and is agreeable to continue.   2. Bone directed therapy. On Xgeva monthly with calcium supplementation.he is fully healed from his recent  dental work and ready to restart [place] today.  3. Androgen deprivation: Last injection given on 06/06/2016. This will be repeated after 10/05/2016.  4. 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 Much better at this time and continues to be less of an issue.  5. Followup: 6 weeks to follow-up.    [facility], MD 3/1/20188:52 AM

## 2016-08-22 LAB — PSA: Prostate Specific Ag, Serum: 25.4 ng/mL — ABNORMAL HIGH (ref 0.0–4.0)

## 2016-08-26 ENCOUNTER — Telehealth: Payer: Self-pay | Admitting: Oncology

## 2016-08-26 NOTE — Telephone Encounter (Signed)
lvm to inform pt of 4/17 appt time change to 3 pm per LOS

## 2016-09-02 ENCOUNTER — Encounter: Payer: Self-pay | Admitting: Pharmacist

## 2016-09-02 NOTE — Progress Notes (Signed)
Oral Chemotherapy Pharmacist Encounter  Received fax from [facility] with physician diagnosis certification form from Patient [name] Mathews) as they have secured copayment funds for the patient to assist with his Zytiga costs. Form completed and faxed to [employer] at [phone].  Johny Drilling, PharmD, BCPS, BCOP 09/02/2016  2:21 PM Oral Oncology [facility] [phone]

## 2016-09-11 ENCOUNTER — Other Ambulatory Visit: Payer: Self-pay | Admitting: *Deleted

## 2016-09-11 DIAGNOSIS — C61 Malignant neoplasm of prostate: Secondary | ICD-10-CM

## 2016-09-11 MED ORDER — ABIRATERONE ACETATE 250 MG PO TABS: 1000.0000 mg | ORAL_TABLET | Freq: Every day | ORAL | 0 refills | Status: DC

## 2016-10-07 ENCOUNTER — Ambulatory Visit (HOSPITAL_BASED_OUTPATIENT_CLINIC_OR_DEPARTMENT_OTHER): Payer: Medicare Other

## 2016-10-07 ENCOUNTER — Telehealth: Payer: Self-pay | Admitting: Oncology

## 2016-10-07 ENCOUNTER — Other Ambulatory Visit (HOSPITAL_BASED_OUTPATIENT_CLINIC_OR_DEPARTMENT_OTHER): Payer: Medicare Other

## 2016-10-07 ENCOUNTER — Ambulatory Visit (HOSPITAL_BASED_OUTPATIENT_CLINIC_OR_DEPARTMENT_OTHER): Payer: Medicare Other | Admitting: Oncology

## 2016-10-07 VITALS — BP 171/75 | HR 65 | Temp 99.3°F | Resp 19 | Ht 61.0 in | Wt 165.9 lb

## 2016-10-07 DIAGNOSIS — K0889 Other specified disorders of teeth and supporting structures: Secondary | ICD-10-CM | POA: Diagnosis not present

## 2016-10-07 DIAGNOSIS — Z5111 Encounter for antineoplastic chemotherapy: Secondary | ICD-10-CM

## 2016-10-07 DIAGNOSIS — C7951 Secondary malignant neoplasm of bone: Secondary | ICD-10-CM

## 2016-10-07 DIAGNOSIS — C61 Malignant neoplasm of prostate: Secondary | ICD-10-CM

## 2016-10-07 DIAGNOSIS — E291 Testicular hypofunction: Secondary | ICD-10-CM | POA: Diagnosis not present

## 2016-10-07 LAB — CBC WITH DIFFERENTIAL/PLATELET
BASO%: 0.3 % (ref 0.0–2.0)
BASOS ABS: 0 10*3/uL (ref 0.0–0.1)
EOS%: 0.4 % (ref 0.0–7.0)
Eosinophils Absolute: 0 10*3/uL (ref 0.0–0.5)
HEMATOCRIT: 38.1 % — AB (ref 38.4–49.9)
HEMOGLOBIN: 12.6 g/dL — AB (ref 13.0–17.1)
LYMPH#: 1.2 10*3/uL (ref 0.9–3.3)
LYMPH%: 16.7 % (ref 14.0–49.0)
MCH: 26.7 pg — ABNORMAL LOW (ref 27.2–33.4)
MCHC: 33.1 g/dL (ref 32.0–36.0)
MCV: 80.7 fL (ref 79.3–98.0)
MONO#: 0.3 10*3/uL (ref 0.1–0.9)
MONO%: 3.6 % (ref 0.0–14.0)
NEUT%: 79 % — ABNORMAL HIGH (ref 39.0–75.0)
NEUTROS ABS: 5.9 10*3/uL (ref 1.5–6.5)
Platelets: 217 10*3/uL (ref 140–400)
RBC: 4.72 10*6/uL (ref 4.20–5.82)
RDW: 14.2 % (ref 11.0–14.6)
WBC: 7.4 10*3/uL (ref 4.0–10.3)

## 2016-10-07 LAB — COMPREHENSIVE METABOLIC PANEL
ALBUMIN: 3.6 g/dL (ref 3.5–5.0)
ALK PHOS: 76 U/L (ref 40–150)
ALT: 10 U/L (ref 0–55)
AST: 20 U/L (ref 5–34)
Anion Gap: 10 mEq/L (ref 3–11)
BUN: 14.7 mg/dL (ref 7.0–26.0)
CALCIUM: 9.3 mg/dL (ref 8.4–10.4)
CO2: 30 mEq/L — ABNORMAL HIGH (ref 22–29)
CREATININE: 0.8 mg/dL (ref 0.7–1.3)
Chloride: 106 mEq/L (ref 98–109)
EGFR: >90 mL/min/{1.73_m2} (ref >90)
Glucose: 124 mg/dl (ref 70–140)
Potassium: 3.6 mEq/L (ref 3.5–5.1)
Sodium: 146 mEq/L — ABNORMAL HIGH (ref 136–145)
TOTAL PROTEIN: 7 g/dL (ref 6.4–8.3)
Total Bilirubin: 0.52 mg/dL (ref 0.20–1.20)

## 2016-10-07 MED ORDER — HYDROCODONE-ACETAMINOPHEN 5-325 MG PO TABS: 1.0000 | ORAL_TABLET | Freq: Four times a day (QID) | ORAL | 0 refills | Status: DC | PRN

## 2016-10-07 MED ORDER — DENOSUMAB 120 MG/1.7ML ~~LOC~~ SOLN
120.0000 mg | Freq: Once | SUBCUTANEOUS | Status: AC
  Administered 2016-10-07: 120 mg via SUBCUTANEOUS
  Filled 2016-10-07: qty 1.7

## 2016-10-07 MED ORDER — LEUPROLIDE ACETATE (4 MONTH) 30 MG IM KIT
30.0000 mg | PACK | Freq: Once | INTRAMUSCULAR | Status: AC
  Administered 2016-10-07: 30 mg via INTRAMUSCULAR
  Filled 2016-10-07: qty 30

## 2016-10-07 NOTE — Patient Instructions (Signed)
Denosumab injection What is this medicine? DENOSUMAB (den oh sue mab) slows bone breakdown. Prolia is used to treat osteoporosis in women after menopause and in men. Delton See is used to treat a high calcium level due to cancer and to prevent bone fractures and other bone problems caused by multiple myeloma or cancer bone metastases. Delton See is also used to treat giant cell tumor of the bone. This medicine may be used for other purposes; ask your health care provider or pharmacist if you have questions. COMMON BRAND NAME(S): Prolia, XGEVA What should I tell my health care provider before I take this medicine? They need to know if you have any of these conditions: -dental disease -having surgery or tooth extraction -infection -kidney disease -low levels of calcium or Vitamin D in the blood -malnutrition -on hemodialysis -skin conditions or sensitivity -thyroid or parathyroid disease -an unusual reaction to denosumab, other medicines, foods, dyes, or preservatives -pregnant or trying to get pregnant -breast-feeding How should I use this medicine? This medicine is for injection under the skin. It is given by a health care professional in a hospital or clinic setting. If you are getting Prolia, a special MedGuide will be given to you by the pharmacist with each prescription and refill. Be sure to read this information carefully each time. For Prolia, talk to your pediatrician regarding the use of this medicine in children. Special care may be needed. For Delton See, talk to your pediatrician regarding the use of this medicine in children. While this drug may be prescribed for children as young as 13 years for selected conditions, precautions do apply. Overdosage: If you think you have taken too much of this medicine contact a poison control center or emergency room at once. NOTE: This medicine is only for you. Do not share this medicine with others. What if I miss a dose? It is important not to miss your  dose. Call your doctor or health care professional if you are unable to keep an appointment. What may interact with this medicine? Do not take this medicine with any of the following medications: -other medicines containing denosumab This medicine may also interact with the following medications: -medicines that lower your chance of fighting infection -steroid medicines like prednisone or cortisone This list may not describe all possible interactions. Give your health care provider a list of all the medicines, herbs, non-prescription drugs, or dietary supplements you use. Also tell them if you smoke, drink alcohol, or use illegal drugs. Some items may interact with your medicine. What should I watch for while using this medicine? Visit your doctor or health care professional for regular checks on your progress. Your doctor or health care professional may order blood tests and other tests to see how you are doing. Call your doctor or health care professional for advice if you get a fever, chills or sore throat, or other symptoms of a cold or flu. Do not treat yourself. This drug may decrease your body's ability to fight infection. Try to avoid being around people who are sick. You should make sure you get enough calcium and vitamin D while you are taking this medicine, unless your doctor tells you not to. Discuss the foods you eat and the vitamins you take with your health care professional. See your dentist regularly. Brush and floss your teeth as directed. Before you have any dental work done, tell your dentist you are receiving this medicine. Do not become pregnant while taking this medicine or for 5 months after stopping  it. Talk with your doctor or health care professional about your birth control options while taking this medicine. Women should inform their doctor if they wish to become pregnant or think they might be pregnant. There is a potential for serious side effects to an unborn child. Talk  to your health care professional or pharmacist for more information. What side effects may I notice from receiving this medicine? Side effects that you should report to your doctor or health care professional as soon as possible: -allergic reactions like skin rash, itching or hives, swelling of the face, lips, or tongue -bone pain -breathing problems -dizziness -jaw pain, especially after dental work -redness, blistering, peeling of the skin -signs and symptoms of infection like fever or chills; cough; sore throat; pain or trouble passing urine -signs of low calcium like fast heartbeat, muscle cramps or muscle pain; pain, tingling, numbness in the hands or feet; seizures -unusual bleeding or bruising -unusually weak or tired Side effects that usually do not require medical attention (report to your doctor or health care professional if they continue or are bothersome): -constipation -diarrhea -headache -joint pain -loss of appetite -muscle pain -runny nose -tiredness -upset stomach This list may not describe all possible side effects. Call your doctor for medical advice about side effects. You may report side effects to FDA at 1-800-FDA-1088. Where should I keep my medicine? This medicine is only given in a clinic, doctor's office, or other health care setting and will not be stored at home. NOTE: This sheet is a summary. It may not cover all possible information. If you have questions about this medicine, talk to your doctor, pharmacist, or health care provider.  2018 Elsevier/Gold Standard (2016-07-01 19:17:21) Leuprolide depot injection What is this medicine? LEUPROLIDE (loo PROE lide) is a man-made protein that acts like a natural hormone in the body. It decreases testosterone in men and decreases estrogen in women. In men, this medicine is used to treat advanced prostate cancer. In women, some forms of this medicine may be used to treat endometriosis, uterine fibroids, or other  male hormone-related problems. This medicine may be used for other purposes; ask your health care provider or pharmacist if you have questions. COMMON BRAND NAME(S): Eligard, Lupron Depot, Lupron Depot-Ped, Viadur What should I tell my health care provider before I take this medicine? They need to know if you have any of these conditions: -diabetes -heart disease or previous heart attack -high blood pressure -high cholesterol -mental illness -osteoporosis -pain or difficulty passing urine -seizures -spinal cord metastasis -stroke -suicidal thoughts, plans, or attempt; a previous suicide attempt by you or a family member -tobacco smoker -unusual vaginal bleeding (women) -an unusual or allergic reaction to leuprolide, benzyl alcohol, other medicines, foods, dyes, or preservatives -pregnant or trying to get pregnant -breast-feeding How should I use this medicine? This medicine is for injection into a muscle or for injection under the skin. It is given by a health care professional in a hospital or clinic setting. The specific product will determine how it will be given to you. Make sure you understand which product you receive and how often you will receive it. Talk to your pediatrician regarding the use of this medicine in children. Special care may be needed. Overdosage: If you think you have taken too much of this medicine contact a poison control center or emergency room at once. NOTE: This medicine is only for you. Do not share this medicine with others. What if I miss a dose? It is  important not to miss a dose. Call your doctor or health care professional if you are unable to keep an appointment. Depot injections: Depot injections are given either once-monthly, every 12 weeks, every 16 weeks, or every 24 weeks depending on the product you are prescribed. The product you are prescribed will be based on if you are male or male, and your condition. Make sure you understand your  product and dosing. What may interact with this medicine? Do not take this medicine with any of the following medications: -chasteberry This medicine may also interact with the following medications: -herbal or dietary supplements, like black cohosh or DHEA -male hormones, like estrogens or progestins and birth control pills, patches, rings, or injections -male hormones, like testosterone This list may not describe all possible interactions. Give your health care provider a list of all the medicines, herbs, non-prescription drugs, or dietary supplements you use. Also tell them if you smoke, drink alcohol, or use illegal drugs. Some items may interact with your medicine. What should I watch for while using this medicine? Visit your doctor or health care professional for regular checks on your progress. During the first weeks of treatment, your symptoms may get worse, but then will improve as you continue your treatment. You may get hot flashes, increased bone pain, increased difficulty passing urine, or an aggravation of nerve symptoms. Discuss these effects with your doctor or health care professional, some of them may improve with continued use of this medicine. Male patients may experience a menstrual cycle or spotting during the first months of therapy with this medicine. If this continues, contact your doctor or health care professional. What side effects may I notice from receiving this medicine? Side effects that you should report to your doctor or health care professional as soon as possible: -allergic reactions like skin rash, itching or hives, swelling of the face, lips, or tongue -breathing problems -chest pain -depression or memory disorders -pain in your legs or groin -pain at site where injected or implanted -seizures -severe headache -swelling of the feet and legs -suicidal thoughts or other mood changes -visual changes -vomiting Side effects that usually do not require  medical attention (report to your doctor or health care professional if they continue or are bothersome): -breast swelling or tenderness -decrease in sex drive or performance -diarrhea -hot flashes -loss of appetite -muscle, joint, or bone pains -nausea -redness or irritation at site where injected or implanted -skin problems or acne This list may not describe all possible side effects. Call your doctor for medical advice about side effects. You may report side effects to FDA at 1-800-FDA-1088. Where should I keep my medicine? This drug is given in a hospital or clinic and will not be stored at home. NOTE: This sheet is a summary. It may not cover all possible information. If you have questions about this medicine, talk to your doctor, pharmacist, or health care provider.  2018 Elsevier/Gold Standard (2015-11-22 09:45:53)

## 2016-10-07 NOTE — Telephone Encounter (Signed)
Appointments scheduled per 10/07/16 los. Patient was given a copy of the AVS report and appointment schedule, per 10/07/16 los.

## 2016-10-07 NOTE — Progress Notes (Signed)
Hematology and Oncology Follow Up Visit  [name] Mathews [id number] Nov 11, 1945 71 y.o. 10/07/2016 2:59 PM    Principle Diagnosis: 71 year old [name] Mathews with advanced prostate cancer. He has metastatic disease to the bone. He was inially diagnosed  2001, gleason score 4+5= 9.  Prior Therapy: 1. 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 He was started on hormone therapy, initially with Lupron.  2. Casodex was added to firmagon in 2009 because of PSA rise.  3. 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  Current therapy:  Zytiga 1000 mg po daily started in 09/2011 with prednisone 5 mg daily. Xgeva 120 mg started on 10/31/2011. This is given monthly.  Lupron 30 mg subcutaneously to be resumed on 06/14/2015 and every 4 months after that.   Interim History: [name] Mathews presents today for a follow up visit. Since the last visit, he reports no major changes in his health. He does have some dental pain related to a tooth that needs extraction which he has elected not to do. He continues to take as Zytiga without any complications. He has not reported any GI complications such as nausea or abdominal pain. Has not reported any change in his bowel habits. His performance status and quality of life has not dramatically changed. He denied any pain, neuropathy or any discomfort while he is active. He denied any recent back pain, bone pain or pathological fractures.   He does not report any headaches, blurry vision, syncope or seizures. He does not report any fevers or chills or sweats.  He has no shortness of breath, chest pain, cough, swelling, or palpitations. He denies any nausea, vomiting or abdominal pain. He does not report any frequency urgency or hesitancy. The remainder of his detailed  review of systems is otherwise unremarkable  Medications: I have reviewed the patient's current medications. Current Outpatient Prescriptions  Medication Sig Dispense Refill  . abiraterone Acetate (ZYTIGA) 250 MG tablet Take 4 tablets (1,000 mg total) by mouth daily. Take on an empty stomach 1 hour before or 2 hours after a meal 120 tablet 0  . atorvastatin (LIPITOR) 10 MG tablet Take 10 mg by mouth daily.    . Calcium Carbonate (CALTRATE 600 PO) Take 600 mg by mouth 3 (three) times daily.    [name] denosumab (XGEVA) 120 MG/1.7ML SOLN Inject 120 mg into the skin every 30 (thirty) days.    . DULoxetine (CYMBALTA) 60 MG capsule Take 60 mg by mouth daily.  2  . EPIPEN 2-PAK 0.3 MG/0.3ML SOAJ injection Reported on 01/04/2016    . fish oil-omega-3 fatty acids 1000 MG capsule Take 1 g by mouth daily.    . meloxicam (MOBIC) 7.5 MG tablet   1  . predniSONE (DELTASONE) 5 MG tablet TAKE 1 TABLET (5 MG TOTAL) BY MOUTH 2 (TWO) TIMES DAILY. 60 tablet 2  . rOPINIRole (REQUIP) 2 MG tablet Take 2 mg by mouth 2 (two) times daily.    . vitamin B-12 (CYANOCOBALAMIN) 1000 MCG tablet Take 1,000 mcg by mouth daily.     No current facility-administered medications for this visit.      Allergies:  Allergies  Allergen Reactions  . Bee Venom Anaphylaxis, Shortness Of Breath and Swelling    Tongue swelling.   . Wasp Venom Anaphylaxis, Shortness Of Breath and Swelling  Tongue swelling   . Percocet [Oxycodone-Acetaminophen] Palpitations    His past medical history was reviewed today and is unchanged.    Physical Exam: Blood pressure (!) 171/75, pulse 65, temperature 99.3 F (37.4 C), temperature source Oral, resp. rate 19, height 5\' 1"  (1.549 m), weight 165 lb 14.4 oz (75.3 kg), SpO2 98 %. ECOG: 0 General appearance: Alert, awake [name] Mathews without distress. Head: Normocephalic, without obvious abnormality no oral ulcers or lesions. Neck: no adenopathy or thyromegaly. Lymph nodes: Cervical, supraclavicular,  and axillary nodes normal. Heart:regular rate and rhythm, S1, S2 normal, no murmur, click, rub or gallop Lung:chest clear, no wheezing, rales, normal symmetric air entry Abdomen: soft, non-tender, without masses or organomegaly no rebound or guarding. EXT:no erythema, induration, or nodules Neurological examination: No motor or sensory deficits.   Lab Results: Lab Results  Component Value Date   WBC 7.4 10/07/2016   HGB 12.6 (L) 10/07/2016   HCT 38.1 (L) 10/07/2016   MCV 80.7 10/07/2016   PLT 217 10/07/2016     Chemistry      Component Value Date/Time   NA 144 08/21/2016 0832   K 4.0 08/21/2016 0832   CL 105 12/08/2012 1504   CO2 30 (H) 08/21/2016 0832   BUN 13.3 08/21/2016 0832   CREATININE 0.8 08/21/2016 0832      Component Value Date/Time   CALCIUM 9.4 08/21/2016 0832   ALKPHOS 97 08/21/2016 0832   AST 16 08/21/2016 0832   ALT 9 08/21/2016 0832   BILITOT 0.22 08/21/2016 0832       Results for KACE, HARTJE (MRN [id number]) as of 10/07/2016 14:46  Ref. Range 06/06/2016 13:03 07/15/2016 10:48 08/21/2016 08:32  PSA Latest Ref Range: 0.0 - 4.0 ng/mL 23.6 (H) 23.5 (H) 25.4 (H)        Impression and Plan:   71 year old [name] Mathews with the following issues:    1. 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 He has an elevated PSA up to 33 in May of 2013.   He is currently on Zytiga and have tolerated it well since 2013. He continues to tolerate this medication without any complications.   His PSA continues to be relatively stable without any major changes. Bone scan in January 2018 continue to show stable disease.  The plan is to continue with Zytiga at this time and is different salvage therapy upon symptomatic progression.   2. Bone directed therapy. On Xgeva monthly with calcium supplementation. risks and benefits of continuing Xgeva at this time were reviewed including long-term dental complications such as osteonecrosis of the jaw.  I also gave him the options are holding this injection until he is complete with his dental surgery. He elected not to have dental surgery altogether and would rather do with the dental pain periodically if needed to.  3. Androgen deprivation: This will be repeated on 10/07/2016 and every 4 months after that.  4. Lower back pain/ hip pain: Improved at this time.  5. Dental pain: Prescription for hydrocodone will be given to patient to use as needed  6. Followup: 5 weeks to follow-up.    Zola Button, MD 4/17/20182:59 PM

## 2016-10-08 LAB — PSA: Prostate Specific Ag, Serum: 26.5 ng/mL — ABNORMAL HIGH (ref 0.0–4.0)

## 2016-10-28 ENCOUNTER — Other Ambulatory Visit: Payer: Self-pay | Admitting: *Deleted

## 2016-10-28 ENCOUNTER — Encounter: Payer: Self-pay | Admitting: *Deleted

## 2016-10-28 DIAGNOSIS — C61 Malignant neoplasm of prostate: Secondary | ICD-10-CM

## 2016-10-28 MED ORDER — ABIRATERONE ACETATE 250 MG PO TABS: 1000.0000 mg | ORAL_TABLET | Freq: Every day | ORAL | 0 refills | Status: DC

## 2016-11-03 DIAGNOSIS — H43811 Vitreous degeneration, right eye: Secondary | ICD-10-CM | POA: Diagnosis not present

## 2016-11-12 ENCOUNTER — Other Ambulatory Visit: Payer: Medicare Other

## 2016-11-12 ENCOUNTER — Ambulatory Visit: Payer: Medicare Other | Admitting: Oncology

## 2016-11-12 ENCOUNTER — Ambulatory Visit: Payer: Medicare Other

## 2016-11-13 ENCOUNTER — Emergency Department (HOSPITAL_COMMUNITY): Payer: Medicare Other

## 2016-11-13 ENCOUNTER — Emergency Department (HOSPITAL_COMMUNITY)
Admission: EM | Admit: 2016-11-13 | Discharge: 2016-11-13 | Disposition: A | Discharge status: Home / Self Care | Payer: Medicare Other | Attending: Emergency Medicine | Admitting: Emergency Medicine

## 2016-11-13 ENCOUNTER — Encounter (HOSPITAL_COMMUNITY): Payer: Self-pay

## 2016-11-13 DIAGNOSIS — Z8546 Personal history of malignant neoplasm of prostate: Secondary | ICD-10-CM | POA: Diagnosis not present

## 2016-11-13 DIAGNOSIS — R109 Unspecified abdominal pain: Secondary | ICD-10-CM | POA: Diagnosis present

## 2016-11-13 DIAGNOSIS — I10 Essential (primary) hypertension: Secondary | ICD-10-CM | POA: Diagnosis not present

## 2016-11-13 LAB — URINALYSIS, ROUTINE W REFLEX MICROSCOPIC
BILIRUBIN URINE: NEGATIVE
GLUCOSE, UA: NEGATIVE mg/dL
Hgb urine dipstick: NEGATIVE
KETONES UR: NEGATIVE mg/dL
LEUKOCYTES UA: NEGATIVE
Nitrite: NEGATIVE
PH: 6 (ref 5.0–8.0)
Protein, ur: NEGATIVE mg/dL
Specific Gravity, Urine: 1.017 (ref 1.005–1.030)

## 2016-11-13 LAB — CBC
HCT: 40 % (ref 39.0–52.0)
HEMOGLOBIN: 12.9 g/dL — AB (ref 13.0–17.0)
MCH: 26.9 pg (ref 26.0–34.0)
MCHC: 32.3 g/dL (ref 30.0–36.0)
MCV: 83.3 fL (ref 78.0–100.0)
PLATELETS: 224 10*3/uL (ref 150–400)
RBC: 4.8 MIL/uL (ref 4.22–5.81)
RDW: 14.3 % (ref 11.5–15.5)
WBC: 8.8 10*3/uL (ref 4.0–10.5)

## 2016-11-13 LAB — HEPATIC FUNCTION PANEL
ALBUMIN: 3.6 g/dL (ref 3.5–5.0)
ALK PHOS: 76 U/L (ref 38–126)
ALT: 11 U/L — ABNORMAL LOW (ref 17–63)
AST: 25 U/L (ref 15–41)
Bilirubin, Direct: <0.1 mg/dL — ABNORMAL LOW (ref 0.1–0.5)
TOTAL PROTEIN: 7 g/dL (ref 6.5–8.1)
Total Bilirubin: 0.3 mg/dL (ref 0.3–1.2)

## 2016-11-13 LAB — BASIC METABOLIC PANEL
ANION GAP: 9 (ref 5–15)
BUN: 19 mg/dL (ref 6–20)
CALCIUM: 9 mg/dL (ref 8.9–10.3)
CO2: 30 mmol/L (ref 22–32)
CREATININE: 0.89 mg/dL (ref 0.61–1.24)
Chloride: 102 mmol/L (ref 101–111)
GFR calc Af Amer: >60 mL/min (ref >60)
GLUCOSE: 111 mg/dL — AB (ref 65–99)
Potassium: 3.8 mmol/L (ref 3.5–5.1)
Sodium: 141 mmol/L (ref 135–145)

## 2016-11-13 LAB — LIPASE, BLOOD: LIPASE: 34 U/L (ref 11–51)

## 2016-11-13 MED ORDER — TRAMADOL HCL 50 MG PO TABS
50.0000 mg | ORAL_TABLET | Freq: Once | ORAL | Status: AC
  Administered 2016-11-13: 50 mg via ORAL
  Filled 2016-11-13: qty 1

## 2016-11-13 MED ORDER — HYDROMORPHONE HCL 1 MG/ML IJ SOLN
1.0000 mg | Freq: Once | INTRAMUSCULAR | Status: AC
  Administered 2016-11-13: 1 mg via INTRAVENOUS
  Filled 2016-11-13: qty 1

## 2016-11-13 NOTE — ED Triage Notes (Signed)
Pt complains of right sided flank pain for two days, no urinary sx, denies vomiting or diarrhea

## 2016-11-13 NOTE — ED Provider Notes (Signed)
[facility] DEPT Provider Note   CSN: [id number] Arrival date & time: 11/13/16  0350     History   Chief Complaint Chief Complaint  Patient presents with  . Flank Pain    HPI [name] Mathews is a 71 y.o. male.  HPI Patient has a history of prostate cancer status post treatment past 2 presents to the emergency department with complaints of right-sided back and flank pain over the past 2 days that is been constant and severe in severity.  Denies nausea vomiting diarrhea.  No fevers or chills.  No dysuria or urinary frequency.  No prior history of abdominal surgery.  No history of gallstones.  No other complaints at this time   Past Medical History:  Diagnosis Date  . Hyperlipidemia   . Hypertension   . Prostate cancer ([place])   . Restless leg syndrome     Patient Active Problem List   Diagnosis Date Noted  . Lower back pain 09/08/2014  . 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 No pertinent surgical history.     Home Medications    Prior to Admission medications   Medication Sig Start Date End Date Taking? Authorizing Provider  abiraterone Acetate (ZYTIGA) 250 MG tablet Take 4 tablets (1,000 mg total) by mouth daily. Take on an empty stomach 1 hour before or 2 hours after a meal 10/28/16  Yes [name], [name], MD  denosumab (XGEVA) 120 MG/1.7ML SOLN Inject 120 mg into the skin every 30 (thirty) days.   Yes [provider]  DULoxetine (CYMBALTA) 60 MG capsule Take 60 mg by mouth daily. 12/21/14  Yes [provider]  EPIPEN 2-PAK 0.3 MG/0.3ML SOAJ injection Reported on 01/04/2016 10/27/13  Yes [provider]  HYDROcodone-acetaminophen (NORCO) 5-325 MG tablet Take 1 tablet by mouth every 6 (six) hours as needed for moderate pain. 10/07/16  Yes [name], MD  leuprolide (LUPRON) 30 MG injection Inject 30 mg into the muscle every 4 (four) months.   Yes [provider]  predniSONE (DELTASONE) 5 MG tablet TAKE 1  TABLET (5 MG TOTAL) BY MOUTH 2 (TWO) TIMES DAILY. 08/21/16  Yes [name], MD  rOPINIRole (REQUIP) 2 MG tablet Take 2 mg by mouth 2 (two) times daily.   Yes [provider]    Family History History reviewed. No pertinent family history.  Social History Social History  Substance Use Topics  . Smoking status: Never Smoker  . Smokeless tobacco: Never Used  . Alcohol use No     Allergies   Bee venom; Wasp venom; and Percocet [oxycodone-acetaminophen]   Review of Systems Review of Systems  All other systems reviewed and are negative.    Physical Exam Updated Vital Signs BP 135/70 (BP Location: Right Arm)   Pulse 62   Temp 97.7 F (36.5 C) (Oral)   Resp 18   SpO2 95%   Physical Exam  Constitutional: He is oriented to person, place, and time. He appears well-developed and well-nourished.  HENT:  Head: Normocephalic and atraumatic.  Eyes: EOM are normal.  Neck: Normal range of motion.  Cardiovascular: Normal rate, regular rhythm, normal heart sounds and intact distal pulses.   Pulmonary/Chest: Effort normal and breath sounds normal. No respiratory distress.  Abdominal: Soft. He exhibits no distension.  Mild right-sided abdominal tenderness without guarding or rebound  Musculoskeletal: Normal range of motion.  Mild right flank tenderness  Neurological: He is alert and oriented to person, place, and time.  Skin: Skin  is warm and dry.  Psychiatric: He has a normal mood and affect. Judgment normal.  Nursing note and vitals reviewed.    ED Treatments / Results  Labs (all labs ordered are listed, but only abnormal results are displayed) Labs Reviewed  BASIC METABOLIC PANEL - Abnormal; Notable for the following:       Result Value   Glucose, Bld 111 (*)    All other components within normal limits  CBC - Abnormal; Notable for the following:    Hemoglobin 12.9 (*)    All other components within normal limits  HEPATIC FUNCTION PANEL - Abnormal; Notable  for the following:    ALT 11 (*)    Bilirubin, Direct <0.1 (*)    All other components within normal limits  URINALYSIS, ROUTINE W REFLEX MICROSCOPIC  LIPASE, BLOOD    EKG  EKG Interpretation None       Radiology Ct Renal Stone Study  Result Date: 11/13/2016 CLINICAL DATA:  Severe right flank pain for 2 days. History of hypertension and prostate cancer. EXAM: CT ABDOMEN AND PELVIS WITHOUT CONTRAST TECHNIQUE: Multidetector CT imaging of the abdomen and pelvis was performed following the standard protocol without IV contrast. COMPARISON:  05/02/2015 FINDINGS: Lower chest: Fibrosis in the lung bases.  Bilateral gynecomastia. Hepatobiliary: No focal liver abnormality is seen. No gallstones, gallbladder wall thickening, or biliary dilatation. Pancreas: Unremarkable. No pancreatic ductal dilatation or surrounding inflammatory changes. Spleen: Normal in size without focal abnormality. Adrenals/Urinary Tract: No adrenal gland nodules. Kidneys are symmetrical. No hydronephrosis or hydroureter. No renal or ureteral stones. Bladder wall is diffusely thickened. This could indicate cystitis or bladder outlet obstruction. Bladder wall thickening could also be related radiation change if the patient has had radiation therapy. Prostatic impression on the bladder base. No definite bladder stones. Stomach/Bowel: Diverticulosis of the sigmoid colon. Scattered stool throughout the colon. No colonic distention or wall thickening. Appendix is normal. Stomach is decompressed. Small bowel are not abnormally distended and no inflammatory changes are appreciated. Vascular/Lymphatic: Aortic atherosclerosis. No enlarged abdominal or pelvic lymph nodes. Reproductive: Prominent enlargement of the prostate gland, measuring up to about 4.9 cm diameter. Prostate margins are somewhat hazy which could indicate local infiltration of tumor or this could represent radiation change or inflammatory change. Correlation with history  recommended. Surgical clips in the pelvis. Enlarged left iliac lymph node measuring 1.5 cm diameter. This is increasing in size since previous study and likely represents a metastatic lesion. Additional prominent lymph nodes in the pelvis without pathologic enlargement could also be metastatic. Other: No free air or free fluid in the abdomen. Abdominal wall musculature appears intact. Musculoskeletal: Sclerotic lesions throughout the vertebrae, sacrum, and pelvis consistent with diffuse bone metastasis. Degenerative changes in the spine. IMPRESSION: No renal or ureteral stone or obstruction. Bladder wall thickening could be due to cystitis, eyelid obstruction, or radiation change. Prostate enlargement. Hazy margins of the prostate could represent tumor infiltration, radiation change, or inflammatory process. Correlation with clinical and surgical history recommended. Diffuse bony metastases again demonstrated. Electronically Signed   By: Lucienne Capers M.D.   On: 11/13/2016 06:41    Procedures Procedures (including critical care time)  Medications Ordered in ED Medications  HYDROmorphone (DILAUDID) injection 1 mg (1 mg Intravenous Given 11/13/16 0608)     Initial Impression / Assessment and Plan / ED Course  I have reviewed the triage vital signs and the nursing notes.  Pertinent labs & imaging results that were available during my care of the patient were  reviewed by me and considered in my medical decision making (see chart for details).     Patient is overall well-appearing.  He feels much better at this time.  Labs, urine, CT scan without abnormality.  Plan will be to discharge home with short course of pain medication primary care follow-up.  Doubt prostatitis.  Questionable changes around the prostate will need further follow-up with his urologist and oncologist given his history of prior prostate cancer  Final Clinical Impressions(s) / ED Diagnoses   Final diagnoses:  None    New  Prescriptions New Prescriptions   No medications on file     Jola Schmidt, MD 11/16/16 1317

## 2016-11-13 NOTE — ED Provider Notes (Signed)
7:19 AM Patient now does remember moving a big heavy rock prior to when his pain started.  This could be musculoskeletal low back pain.   Jola Schmidt, MD 11/13/16 [phone]

## 2016-11-14 ENCOUNTER — Telehealth: Payer: Self-pay | Admitting: Oncology

## 2016-11-14 ENCOUNTER — Telehealth: Payer: Self-pay | Admitting: *Deleted

## 2016-11-14 NOTE — Telephone Encounter (Signed)
Patient needs to reschedule his missed appointments on 11/13/16 due to being in the ER. He needs lab/MD/injection. Message to be given to Dr. Alen Blew.

## 2016-11-14 NOTE — Telephone Encounter (Signed)
He can see [place] in symptom management on 5/29 am. He needs lab, Mid level and injection.

## 2016-11-14 NOTE — Telephone Encounter (Signed)
Spoke with patient re 5/29 appointments.  °

## 2016-11-18 ENCOUNTER — Ambulatory Visit (HOSPITAL_BASED_OUTPATIENT_CLINIC_OR_DEPARTMENT_OTHER): Payer: Medicare Other

## 2016-11-18 ENCOUNTER — Other Ambulatory Visit (HOSPITAL_BASED_OUTPATIENT_CLINIC_OR_DEPARTMENT_OTHER): Payer: Medicare Other

## 2016-11-18 ENCOUNTER — Encounter: Payer: Self-pay | Admitting: Oncology

## 2016-11-18 ENCOUNTER — Telehealth: Payer: Self-pay | Admitting: Oncology

## 2016-11-18 ENCOUNTER — Ambulatory Visit (HOSPITAL_BASED_OUTPATIENT_CLINIC_OR_DEPARTMENT_OTHER): Payer: Medicare Other | Admitting: Oncology

## 2016-11-18 VITALS — BP 201/106 | HR 59 | Temp 98.2°F | Resp 18 | Ht 61.0 in | Wt 168.1 lb

## 2016-11-18 DIAGNOSIS — C7951 Secondary malignant neoplasm of bone: Secondary | ICD-10-CM

## 2016-11-18 DIAGNOSIS — C61 Malignant neoplasm of prostate: Secondary | ICD-10-CM

## 2016-11-18 DIAGNOSIS — M545 Low back pain, unspecified: Secondary | ICD-10-CM

## 2016-11-18 DIAGNOSIS — M25559 Pain in unspecified hip: Secondary | ICD-10-CM

## 2016-11-18 DIAGNOSIS — E291 Testicular hypofunction: Secondary | ICD-10-CM

## 2016-11-18 LAB — COMPREHENSIVE METABOLIC PANEL
ALT: 8 U/L (ref 0–55)
ANION GAP: 9 meq/L (ref 3–11)
AST: 18 U/L (ref 5–34)
Albumin: 3.4 g/dL — ABNORMAL LOW (ref 3.5–5.0)
Alkaline Phosphatase: 76 U/L (ref 40–150)
BUN: 15.1 mg/dL (ref 7.0–26.0)
CALCIUM: 8.9 mg/dL (ref 8.4–10.4)
CHLORIDE: 106 meq/L (ref 98–109)
CO2: 30 mEq/L — ABNORMAL HIGH (ref 22–29)
CREATININE: 0.8 mg/dL (ref 0.7–1.3)
Glucose: 104 mg/dl (ref 70–140)
Potassium: 3.6 mEq/L (ref 3.5–5.1)
Sodium: 145 mEq/L (ref 136–145)
Total Bilirubin: 0.33 mg/dL (ref 0.20–1.20)
Total Protein: 6.6 g/dL (ref 6.4–8.3)

## 2016-11-18 LAB — CBC WITH DIFFERENTIAL/PLATELET
BASO%: 0.1 % (ref 0.0–2.0)
Basophils Absolute: 0 10*3/uL (ref 0.0–0.1)
EOS%: 2.3 % (ref 0.0–7.0)
Eosinophils Absolute: 0.2 10*3/uL (ref 0.0–0.5)
HEMATOCRIT: 38.4 % (ref 38.4–49.9)
HGB: 12.1 g/dL — ABNORMAL LOW (ref 13.0–17.1)
LYMPH%: 33.4 % (ref 14.0–49.0)
MCH: 26.4 pg — AB (ref 27.2–33.4)
MCHC: 31.5 g/dL — AB (ref 32.0–36.0)
MCV: 83.8 fL (ref 79.3–98.0)
MONO#: 0.5 10*3/uL (ref 0.1–0.9)
MONO%: 6.1 % (ref 0.0–14.0)
NEUT#: 4.3 10*3/uL (ref 1.5–6.5)
NEUT%: 58.1 % (ref 39.0–75.0)
PLATELETS: 204 10*3/uL (ref 140–400)
RBC: 4.58 10*6/uL (ref 4.20–5.82)
RDW: 14.4 % (ref 11.0–14.6)
WBC: 7.4 10*3/uL (ref 4.0–10.3)
lymph#: 2.5 10*3/uL (ref 0.9–3.3)

## 2016-11-18 MED ORDER — DENOSUMAB 120 MG/1.7ML ~~LOC~~ SOLN
120.0000 mg | Freq: Once | SUBCUTANEOUS | Status: AC
  Administered 2016-11-18: 120 mg via SUBCUTANEOUS
  Filled 2016-11-18: qty 1.7

## 2016-11-18 MED ORDER — HYDROMORPHONE HCL 2 MG PO TABS: 2.0000 mg | ORAL_TABLET | ORAL | 0 refills | Status: DC | PRN

## 2016-11-18 MED ORDER — LEUPROLIDE ACETATE (4 MONTH) 30 MG IM KIT: 30.0000 mg | PACK | Freq: Once | INTRAMUSCULAR | Status: DC

## 2016-11-18 NOTE — Telephone Encounter (Signed)
Gave patient AVS and calender per 5/29 LOS. [facility] to contact patient with MRI schedule.

## 2016-11-18 NOTE — Progress Notes (Signed)
Hematology and Oncology Follow Up Visit  [name] Mathews [id number] [birth year] 71 y.o. 11/18/2016 12:19 PM    Principle Diagnosis: 71 year old gentleman with advanced prostate cancer. He has metastatic disease to the bone. He was inially diagnosed  2001, gleason score 4+5= 9.  Prior Therapy: 1. 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 He was started on hormone therapy, initially with Lupron.  2. Casodex was added to firmagon in 2009 because of PSA rise.  3. 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  Current therapy:  Zytiga 1000 mg po daily started in 09/2011 with prednisone 5 mg daily. Xgeva 120 mg started on 10/31/2011. This is given monthly.  Lupron 30 mg subcutaneously to be resumed on 06/14/2015 and every 4 months after that.   Interim History: [name] presents today for a follow up visit. Patient is having increased back pain. He was seen in the emergency room last week for the same issue. CT scan in the emergency room did show prostate enlargement and diffuse bony metastases, but no acute cause for the pain. The pain is in his mid to lower back toward the right flank. Reports the pain radiates around to his abdomen. Pain does not radiate down his leg. He denies any weakness in his lower extremities and difficulty walking. Pain is worse when he is lying down. He is using hydrocodone 2-3 times per day. He also uses ibuprofen in between doses of hydrocodone. He has not had any falls. He continues to take as Zytiga without any complications. He has not reported any GI complications such as nausea or abdominal pain. Has not reported any change in his bowel habits. His performance status and quality of life has not dramatically changed. He denied neuropathy.   He does not  report any headaches, blurry vision, syncope or seizures. He does not report any fevers or chills or sweats.  He has no shortness of breath, chest pain, cough, swelling, or palpitations. He denies any nausea, vomiting or abdominal pain. He does not report any frequency urgency or hesitancy. The remainder of his detailed review of systems is otherwise unremarkable  Medications: I have reviewed the patient's current medications. Current Outpatient Prescriptions  Medication Sig Dispense Refill  . abiraterone Acetate (ZYTIGA) 250 MG tablet Take 4 tablets (1,000 mg total) by mouth daily. Take on an empty stomach 1 hour before or 2 hours after a meal 120 tablet 0  . denosumab (XGEVA) 120 MG/1.7ML SOLN Inject 120 mg into the skin every 30 (thirty) days.    . DULoxetine (CYMBALTA) 60 MG capsule Take 60 mg by mouth daily.  2  . EPIPEN 2-PAK 0.3 MG/0.3ML SOAJ injection Reported on 01/04/2016    . HYDROcodone-acetaminophen (NORCO) 5-325 MG tablet Take 1 tablet by mouth every 6 (six) hours as needed for moderate pain. 30 tablet 0  . leuprolide (LUPRON) 30 MG injection Inject 30 mg into the muscle every 4 (four) months.    . predniSONE (DELTASONE) 5 MG tablet TAKE 1 TABLET (5 MG TOTAL) BY MOUTH 2 (TWO) TIMES DAILY. 60 tablet 2  . rOPINIRole (REQUIP) 2 MG tablet Take 2 mg by mouth 2 (two) times daily.    [name] HYDROmorphone (DILAUDID) 2 MG tablet Take 1 tablet (2 mg total) by mouth every 4 (four) hours as needed for  severe pain. 60 tablet 0   No current facility-administered medications for this visit.      Allergies:  Allergies  Allergen Reactions  . Bee Venom Anaphylaxis, Shortness Of Breath and Swelling    Tongue swelling.   . Wasp Venom Anaphylaxis, Shortness Of Breath and Swelling    Tongue swelling   . Percocet [Oxycodone-Acetaminophen] Palpitations    His past medical history was reviewed today and is unchanged.    Physical Exam: Blood pressure (!) 201/106, pulse (!) 59, temperature 98.2 F  (36.8 C), temperature source Oral, resp. rate 18, height 5\' 1"  (1.549 m), weight 168 lb 1.6 oz (76.2 kg), SpO2 100 %. ECOG: 0 General appearance: Alert, awake gentleman without distress. Head: Normocephalic, without obvious abnormality no oral ulcers or lesions. Neck: no adenopathy or thyromegaly. Lymph nodes: Cervical, supraclavicular, and axillary nodes normal. Heart:regular rate and rhythm, S1, S2 normal, no murmur, click, rub or gallop Lung:chest clear, no wheezing, rales, normal symmetric air entry Abdomen: soft, non-tender, without masses or organomegaly no rebound or guarding. EXT:no erythema, induration, or nodules Neurological examination: No motor or sensory deficits.   Lab Results: Lab Results  Component Value Date   WBC 7.4 11/18/2016   HGB 12.1 (L) 11/18/2016   HCT 38.4 11/18/2016   MCV 83.8 11/18/2016   PLT 204 11/18/2016     Chemistry      Component Value Date/Time   NA 145 11/18/2016 0918   K 3.6 11/18/2016 0918   CL 102 11/13/2016 0530   CL 105 12/08/2012 1504   CO2 30 (H) 11/18/2016 0918   BUN 15.1 11/18/2016 0918   CREATININE 0.8 11/18/2016 0918      Component Value Date/Time   CALCIUM 8.9 11/18/2016 0918   ALKPHOS 76 11/18/2016 0918   AST 18 11/18/2016 0918   ALT 8 11/18/2016 0918   BILITOT 0.33 11/18/2016 0918       Results for DELSHON, BLANCHFIELD (MRN [id number]) as of 11/18/2016 10:04  Ref. Range 04/25/2016 14:46 06/06/2016 13:03 07/15/2016 10:48 08/21/2016 08:32 10/07/2016 14:38  PSA Latest Ref Range: 0.0 - 4.0 ng/mL 20.0 (H) 23.6 (H) 23.5 (H) 25.4 (H) 26.5 (H)         Impression and Plan:   71 year old gentleman with the following issues:    1. 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 He has an elevated PSA up to 33 in May of 2013.   He is currently on Zytiga and have tolerated it well since 2013. He continues to tolerate this medication without any complications.   His PSA continues to be relatively stable  without any major changes. Bone scan in January 2018 continue to show stable disease.  The plan is to continue with Zytiga at this time and is different salvage therapy upon symptomatic progression.   2. Bone directed therapy. On Xgeva monthly with calcium supplementation. risks and benefits of continuing Xgeva at this time were reviewed including long-term dental complications such as osteonecrosis of the jaw.   3. Androgen deprivation: Last given on 10/07/2016 and every 4 months after that.  4. Lower back pain/ hip pain: Pain is worsening. The pain is not controlled with hydrocodone and ibuprofen. Recent CT of the abdomen and pelvis does not show any acute abnormality. Will schedule patient for an MRI of the thoracic and lumbar spine to evaluate for worsening bone metastases. The pain is not controlled with hydrocodone and ibuprofen. The patient has a reaction to oxycodone. He tolerated hydromorphone well in the  emergency room. A prescription for hydromorphone 2 mg tablets to be used every 4 hours as needed for severe pain was given to him today.  5. Followup: In 3 weeks to reevaluate his back pain and to review his MRI results.  The patient was seen and examined with Dr. Alen Blew.   Mikey Bussing, NP 5/29/201812:19 PM

## 2016-11-19 ENCOUNTER — Telehealth: Payer: Self-pay | Admitting: *Deleted

## 2016-11-19 LAB — PSA: Prostate Specific Ag, Serum: 24.6 ng/mL — ABNORMAL HIGH (ref 0.0–4.0)

## 2016-11-19 NOTE — Telephone Encounter (Signed)
-----   Message from [name], MD sent at 11/19/2016  9:25 AM EDT ----- Please let him know his PSA is down.

## 2016-11-19 NOTE — Telephone Encounter (Signed)
As noted below by Dr. [name], I informed patient of his PSA level. Patient verbalized understanding. 

## 2016-11-21 ENCOUNTER — Other Ambulatory Visit: Payer: Self-pay | Admitting: Oncology

## 2016-11-27 ENCOUNTER — Encounter (HOSPITAL_COMMUNITY): Payer: Self-pay | Admitting: Emergency Medicine

## 2016-11-27 ENCOUNTER — Emergency Department (HOSPITAL_COMMUNITY)
Admission: EM | Admit: 2016-11-27 | Discharge: 2016-11-27 | Disposition: A | Discharge status: Home / Self Care | Payer: Medicare Other | Attending: Emergency Medicine | Admitting: Emergency Medicine

## 2016-11-27 ENCOUNTER — Encounter: Payer: Self-pay | Admitting: *Deleted

## 2016-11-27 DIAGNOSIS — R51 Headache: Secondary | ICD-10-CM | POA: Insufficient documentation

## 2016-11-27 DIAGNOSIS — I10 Essential (primary) hypertension: Secondary | ICD-10-CM | POA: Diagnosis not present

## 2016-11-27 DIAGNOSIS — Z8546 Personal history of malignant neoplasm of prostate: Secondary | ICD-10-CM | POA: Insufficient documentation

## 2016-11-27 DIAGNOSIS — Z79899 Other long term (current) drug therapy: Secondary | ICD-10-CM | POA: Diagnosis not present

## 2016-11-27 DIAGNOSIS — Z91030 Bee allergy status: Secondary | ICD-10-CM | POA: Diagnosis not present

## 2016-11-27 DIAGNOSIS — R079 Chest pain, unspecified: Secondary | ICD-10-CM | POA: Diagnosis not present

## 2016-11-27 LAB — I-STAT CHEM 8, ED
BUN: 11 mg/dL (ref 6–20)
CREATININE: 0.7 mg/dL (ref 0.61–1.24)
Calcium, Ion: 1.15 mmol/L (ref 1.15–1.40)
Chloride: 100 mmol/L — ABNORMAL LOW (ref 101–111)
GLUCOSE: 94 mg/dL (ref 65–99)
HCT: 37 % — ABNORMAL LOW (ref 39.0–52.0)
Hemoglobin: 12.6 g/dL — ABNORMAL LOW (ref 13.0–17.0)
POTASSIUM: 4.1 mmol/L (ref 3.5–5.1)
Sodium: 141 mmol/L (ref 135–145)
TCO2: 31 mmol/L (ref 0–100)

## 2016-11-27 MED ORDER — ACETAMINOPHEN 325 MG PO TABS
650.0000 mg | ORAL_TABLET | Freq: Once | ORAL | Status: AC
  Administered 2016-11-27: 650 mg via ORAL
  Filled 2016-11-27: qty 2

## 2016-11-27 MED ORDER — HYDROCHLOROTHIAZIDE 25 MG PO TABS: 25.0000 mg | ORAL_TABLET | Freq: Every day | ORAL | 0 refills | Status: DC

## 2016-11-27 NOTE — Discharge Instructions (Signed)
Take Tylenol as needed for headaches. Have your doctor recheck your blood pressure in the next one or 2 weeks.

## 2016-11-27 NOTE — ED Triage Notes (Signed)
Patient here from home with complaints of hypertension and headache for the last 4 days. Reports that he is not taking any BP medication. States that he is under a lot of stress. Hx of prostate cancer.

## 2016-11-27 NOTE — ED Provider Notes (Signed)
[facility] DEPT Provider Note   CSN: [id number] Arrival date & time: 11/27/16  1032     History   Chief Complaint Chief Complaint  Patient presents with  . Hypertension  . Headache    HPI KILE KABLER is a 71 y.o. male.  HPI Patient reports slight right sided frontal headache which started gradually 2 days ago. Also reports mild lightheadedness started today. Patient reports he took his blood pressure home which was 132 systolic. He reports his blood pressure was taken in his doctor's office approximately a week ago and reports it is normal. Denies visual complaint denies trauma. Headache is mild at present. Thing makes symptoms better or worse. No focal numbness or weakness. No other associated symptoms Past Medical History:  Diagnosis Date  . Hyperlipidemia   . Hypertension   . Prostate cancer ([place])   . Restless leg syndrome     Patient Active Problem List   Diagnosis Date Noted  . Back pain 09/08/2014  . 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 No pertinent surgical history.     Home Medications    Prior to Admission medications   Medication Sig Start Date End Date Taking? Authorizing Provider  abiraterone Acetate (ZYTIGA) 250 MG tablet Take 4 tablets (1,000 mg total) by mouth daily. Take on an empty stomach 1 hour before or 2 hours after a meal 10/28/16  Yes [name], [name], MD  denosumab (XGEVA) 120 MG/1.7ML SOLN Inject 120 mg into the skin every 30 (thirty) days.   Yes [provider]  DULoxetine (CYMBALTA) 60 MG capsule Take 60 mg by mouth daily. 12/21/14  Yes [provider]  EPIPEN 2-PAK 0.3 MG/0.3ML SOAJ injection Reported on 01/04/2016 10/27/13  Yes [provider]  HYDROcodone-acetaminophen (NORCO) 5-325 MG tablet Take 1 tablet by mouth every 6 (six) hours as needed for moderate pain. 10/07/16  Yes [name], MD  HYDROmorphone (DILAUDID) 2 MG tablet Take 1 tablet (2 mg total) by mouth every 4 (four)  hours as needed for severe pain. 11/18/16  Yes Curcio, Roselie Awkward, NP  leuprolide (LUPRON) 30 MG injection Inject 30 mg into the muscle every 4 (four) months.   Yes [provider]  predniSONE (DELTASONE) 5 MG tablet TAKE 1 TABLET (5 MG TOTAL) BY MOUTH 2 (TWO) TIMES DAILY. 08/21/16  Yes [name], MD  rOPINIRole (REQUIP) 2 MG tablet Take 2 mg by mouth 2 (two) times daily.   Yes [provider]    Family History No family history on file.  Social History Social History  Substance Use Topics  . Smoking status: Never Smoker  . Smokeless tobacco: Never Used  . Alcohol use No     Allergies   Bee venom; Wasp venom; and Percocet [oxycodone-acetaminophen]   Review of Systems Review of Systems  Constitutional: Negative.   HENT: Negative.   Respiratory: Negative.   Cardiovascular: Positive for chest pain.       Syncope  Gastrointestinal: Negative.   Musculoskeletal: Negative.   Skin: Negative.   Allergic/Immunologic: Positive for immunocompromised state.       Cancer patient  Neurological: Positive for light-headedness and headaches.  Psychiatric/Behavioral: Negative.   All other systems reviewed and are negative.    Physical Exam Updated Vital Signs BP (!) 147/90 (BP Location: Left Arm)   Pulse 78   Temp 99 F (37.2 C) (Oral)   Resp 16   Ht 5\' 5"  (1.651 m)   Wt 75.5 kg (166 lb  8 oz)   SpO2 99%   BMI 27.71 kg/m   Physical Exam  Constitutional: He is oriented to person, place, and time. He appears well-developed and well-nourished.  HENT:  Head: Normocephalic and atraumatic.  Eyes: Conjunctivae are normal. Pupils are equal, round, and reactive to light.  Optic discs sharp  Neck: Neck supple. No tracheal deviation present. No thyromegaly present.  Cardiovascular: Normal rate and regular rhythm.   No murmur heard. Pulmonary/Chest: Effort normal and breath sounds normal.  Abdominal: Soft. Bowel sounds are normal. He exhibits no distension. There is  no tenderness.  Musculoskeletal: Normal range of motion. He exhibits no edema or tenderness.  Neurological: He is alert and oriented to person, place, and time. No cranial nerve deficit. Coordination normal.  Gait normal Romberg normal pronator drift normal. DTR symmetric bilaterally at knee jerk ankle jerk and biceps toes downgoing bilaterally motor strength 5 over 5 overall her finger to nose normal. Not lightheaded on standing  Skin: Skin is warm and dry. No rash noted.  Psychiatric: He has a normal mood and affect.  Nursing note and vitals reviewed.    ED Treatments / Results  Labs (all labs ordered are listed, but only abnormal results are displayed) Labs Reviewed  I-STAT CHEM 8, ED   Results for orders placed or performed during the hospital encounter of 11/27/16  I-stat chem 8, ed  Result Value Ref Range   Sodium 141 135 - 145 mmol/L   Potassium 4.1 3.5 - 5.1 mmol/L   Chloride 100 (L) 101 - 111 mmol/L   BUN 11 6 - 20 mg/dL   Creatinine, Ser 0.70 0.61 - 1.24 mg/dL   Glucose, Bld 94 65 - 99 mg/dL   Calcium, Ion 1.15 1.15 - 1.40 mmol/L   TCO2 31 0 - 100 mmol/L   Hemoglobin 12.6 (L) 13.0 - 17.0 g/dL   HCT 37.0 (L) 39.0 - 52.0 %   Ct Renal Stone Study  Result Date: 11/13/2016 CLINICAL DATA:  Severe right flank pain for 2 days. History of hypertension and prostate cancer. EXAM: CT ABDOMEN AND PELVIS WITHOUT CONTRAST TECHNIQUE: Multidetector CT imaging of the abdomen and pelvis was performed following the standard protocol without IV contrast. COMPARISON:  05/02/2015 FINDINGS: Lower chest: Fibrosis in the lung bases.  Bilateral gynecomastia. Hepatobiliary: No focal liver abnormality is seen. No gallstones, gallbladder wall thickening, or biliary dilatation. Pancreas: Unremarkable. No pancreatic ductal dilatation or surrounding inflammatory changes. Spleen: Normal in size without focal abnormality. Adrenals/Urinary Tract: No adrenal gland nodules. Kidneys are symmetrical. No  hydronephrosis or hydroureter. No renal or ureteral stones. Bladder wall is diffusely thickened. This could indicate cystitis or bladder outlet obstruction. Bladder wall thickening could also be related radiation change if the patient has had radiation therapy. Prostatic impression on the bladder base. No definite bladder stones. Stomach/Bowel: Diverticulosis of the sigmoid colon. Scattered stool throughout the colon. No colonic distention or wall thickening. Appendix is normal. Stomach is decompressed. Small bowel are not abnormally distended and no inflammatory changes are appreciated. Vascular/Lymphatic: Aortic atherosclerosis. No enlarged abdominal or pelvic lymph nodes. Reproductive: Prominent enlargement of the prostate gland, measuring up to about 4.9 cm diameter. Prostate margins are somewhat hazy which could indicate local infiltration of tumor or this could represent radiation change or inflammatory change. Correlation with history recommended. Surgical clips in the pelvis. Enlarged left iliac lymph node measuring 1.5 cm diameter. This is increasing in size since previous study and likely represents a metastatic lesion. Additional prominent lymph nodes in  the pelvis without pathologic enlargement could also be metastatic. Other: No free air or free fluid in the abdomen. Abdominal wall musculature appears intact. Musculoskeletal: Sclerotic lesions throughout the vertebrae, sacrum, and pelvis consistent with diffuse bone metastasis. Degenerative changes in the spine. IMPRESSION: No renal or ureteral stone or obstruction. Bladder wall thickening could be due to cystitis, eyelid obstruction, or radiation change. Prostate enlargement. Hazy margins of the prostate could represent tumor infiltration, radiation change, or inflammatory process. Correlation with clinical and surgical history recommended. Diffuse bony metastases again demonstrated. Electronically Signed   By: Lucienne Capers M.D.   On: 11/13/2016  06:41    EKG  EKG Interpretation None       Radiology No results found.  Procedures Procedures (including critical care time)  Medications Ordered in ED Medications  acetaminophen (TYLENOL) tablet 650 mg (650 mg Oral Given 11/27/16 1253)    2:30 PM headache much improved after treatment with Tylenol. Patient resting comfortably. Initial Impression / Assessment and Plan / ED Course  I have reviewed the triage vital signs and the nursing notes. No signs of hypertensive encephalopathy. Plan prescription HCTZ. Follow-up with PMD in 1-2 weeks to get blood pressure rechecked Pertinent labs & imaging results that were available during my care of the patient were reviewed by me and considered in my medical decision making (see chart for details).       Final Clinical Impressions(s) / ED Diagnoses   Final diagnoses:  None    New Prescriptions New Prescriptions   No medications on file     Orlie Dakin, MD 11/27/16 1437

## 2016-11-28 ENCOUNTER — Other Ambulatory Visit: Payer: Self-pay | Admitting: *Deleted

## 2016-11-28 ENCOUNTER — Ambulatory Visit (HOSPITAL_COMMUNITY)
Admission: RE | Admit: 2016-11-28 | Discharge: 2016-11-28 | Disposition: A | Discharge status: Home / Self Care | Payer: Medicare Other | Source: Ambulatory Visit | Attending: Oncology | Admitting: Oncology

## 2016-11-28 DIAGNOSIS — C7951 Secondary malignant neoplasm of bone: Secondary | ICD-10-CM | POA: Diagnosis not present

## 2016-11-28 DIAGNOSIS — C61 Malignant neoplasm of prostate: Secondary | ICD-10-CM

## 2016-11-28 DIAGNOSIS — R590 Localized enlarged lymph nodes: Secondary | ICD-10-CM | POA: Diagnosis not present

## 2016-11-28 DIAGNOSIS — M48061 Spinal stenosis, lumbar region without neurogenic claudication: Secondary | ICD-10-CM | POA: Insufficient documentation

## 2016-11-28 DIAGNOSIS — M4696 Unspecified inflammatory spondylopathy, lumbar region: Secondary | ICD-10-CM | POA: Insufficient documentation

## 2016-11-28 DIAGNOSIS — M4697 Unspecified inflammatory spondylopathy, lumbosacral region: Secondary | ICD-10-CM | POA: Diagnosis not present

## 2016-11-28 DIAGNOSIS — M4807 Spinal stenosis, lumbosacral region: Secondary | ICD-10-CM | POA: Insufficient documentation

## 2016-11-28 MED ORDER — GADOBENATE DIMEGLUMINE 529 MG/ML IV SOLN
20.0000 mL | Freq: Once | INTRAVENOUS | Status: AC | PRN
  Administered 2016-11-28: 15 mL via INTRAVENOUS

## 2016-12-11 ENCOUNTER — Ambulatory Visit (HOSPITAL_BASED_OUTPATIENT_CLINIC_OR_DEPARTMENT_OTHER): Payer: Medicare Other | Admitting: Oncology

## 2016-12-11 ENCOUNTER — Other Ambulatory Visit (HOSPITAL_BASED_OUTPATIENT_CLINIC_OR_DEPARTMENT_OTHER): Payer: Medicare Other

## 2016-12-11 ENCOUNTER — Telehealth: Payer: Self-pay | Admitting: Oncology

## 2016-12-11 VITALS — BP 147/75 | HR 65 | Temp 98.9°F | Resp 18 | Ht 65.0 in | Wt 163.3 lb

## 2016-12-11 DIAGNOSIS — C7951 Secondary malignant neoplasm of bone: Secondary | ICD-10-CM | POA: Diagnosis not present

## 2016-12-11 DIAGNOSIS — C61 Malignant neoplasm of prostate: Secondary | ICD-10-CM | POA: Diagnosis not present

## 2016-12-11 DIAGNOSIS — E291 Testicular hypofunction: Secondary | ICD-10-CM

## 2016-12-11 DIAGNOSIS — M4800 Spinal stenosis, site unspecified: Secondary | ICD-10-CM

## 2016-12-11 LAB — CBC WITH DIFFERENTIAL/PLATELET
BASO%: 0.3 % (ref 0.0–2.0)
BASOS ABS: 0 10*3/uL (ref 0.0–0.1)
EOS ABS: 0 10*3/uL (ref 0.0–0.5)
EOS%: 0.1 % (ref 0.0–7.0)
HCT: 39.4 % (ref 38.4–49.9)
HEMOGLOBIN: 12.8 g/dL — AB (ref 13.0–17.1)
LYMPH%: 16.5 % (ref 14.0–49.0)
MCH: 26.5 pg — AB (ref 27.2–33.4)
MCHC: 32.5 g/dL (ref 32.0–36.0)
MCV: 81.5 fL (ref 79.3–98.0)
MONO#: 0.3 10*3/uL (ref 0.1–0.9)
MONO%: 3.4 % (ref 0.0–14.0)
NEUT#: 7.9 10*3/uL — ABNORMAL HIGH (ref 1.5–6.5)
NEUT%: 79.7 % — ABNORMAL HIGH (ref 39.0–75.0)
Platelets: 238 10*3/uL (ref 140–400)
RBC: 4.83 10*6/uL (ref 4.20–5.82)
RDW: 14.8 % — ABNORMAL HIGH (ref 11.0–14.6)
WBC: 9.9 10*3/uL (ref 4.0–10.3)
lymph#: 1.6 10*3/uL (ref 0.9–3.3)

## 2016-12-11 LAB — COMPREHENSIVE METABOLIC PANEL
ALBUMIN: 3.7 g/dL (ref 3.5–5.0)
ALT: 10 U/L (ref 0–55)
AST: 18 U/L (ref 5–34)
Alkaline Phosphatase: 78 U/L (ref 40–150)
Anion Gap: 11 mEq/L (ref 3–11)
BUN: 19.2 mg/dL (ref 7.0–26.0)
CALCIUM: 10.5 mg/dL — AB (ref 8.4–10.4)
CO2: 33 mEq/L — ABNORMAL HIGH (ref 22–29)
Chloride: 102 mEq/L (ref 98–109)
Creatinine: 1.1 mg/dL (ref 0.7–1.3)
EGFR: 82 mL/min/{1.73_m2} — AB (ref >90)
GLUCOSE: 112 mg/dL (ref 70–140)
POTASSIUM: 3.1 meq/L — AB (ref 3.5–5.1)
SODIUM: 146 meq/L — AB (ref 136–145)
Total Bilirubin: 0.57 mg/dL (ref 0.20–1.20)
Total Protein: 7.4 g/dL (ref 6.4–8.3)

## 2016-12-11 NOTE — Telephone Encounter (Signed)
Appointments scheduled per 12/11/16 los. °Patient was given a copy of the AVS report and appointment schedule, per 12/11/16 los. °

## 2016-12-11 NOTE — Progress Notes (Signed)
Hematology and Oncology Follow Up Visit  TIRON SUSKI [id number] [birth year] 71 y.o. 12/11/2016 4:10 PM    Principle Diagnosis: 71 year old gentleman with advanced prostate cancer. He has metastatic disease to the bone. He was inially diagnosed  2001, gleason score 4+5= 9.  Prior Therapy: 1. 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 He was started on hormone therapy, initially with Lupron.  2. Casodex was added to firmagon in 2009 because of PSA rise.  3. 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  Current therapy:  Zytiga 1000 mg po daily started in 09/2011 with prednisone 5 mg daily. Xgeva 120 mg started on 10/31/2011. This is given monthly.  Lupron 30 mg subcutaneously to be resumed on 06/14/2015 and every 4 months after that.   Interim History: Mr. Bosserman presents today for a follow up visit. Since the last visit, he developed lower back pain that has been chronic in nature. He did complete an MRI of his back on 11/28/2016 which did not show any epidural compression. He was also treated with a steroid Dosepak with resolution of his symptoms. He is no longer reporting any back pain at this time and resumed most activities of daily living. He is currently building a deck for his house and spend 2-4 hours outdoors working.   He continues to take as Zytiga without any complications. He has not reported any GI complications such as nausea or abdominal pain. Has not reported any change in his bowel habits. His performance status and quality of life has not dramatically changed. He denied any pain, neuropathy.  He does not report any headaches, blurry vision, syncope or seizures. He does not report any fevers or chills or sweats.  He has no shortness of breath, chest pain, cough,  swelling, or palpitations. He denies any nausea, vomiting or abdominal pain. He does not report any frequency urgency or hesitancy. The remainder of his detailed review of systems is otherwise unremarkable  Medications: I have reviewed the patient's current medications. Current Outpatient Prescriptions  Medication Sig Dispense Refill  . abiraterone Acetate (ZYTIGA) 250 MG tablet Take 4 tablets (1,000 mg total) by mouth daily. Take on an empty stomach 1 hour before or 2 hours after a meal 120 tablet 0  . denosumab (XGEVA) 120 MG/1.7ML SOLN Inject 120 mg into the skin every 30 (thirty) days.    . DULoxetine (CYMBALTA) 60 MG capsule Take 60 mg by mouth daily.  2  . EPIPEN 2-PAK 0.3 MG/0.3ML SOAJ injection Reported on 01/04/2016    . hydrochlorothiazide (HYDRODIURIL) 25 MG tablet Take 1 tablet (25 mg total) by mouth daily. 30 tablet 0  . HYDROcodone-acetaminophen (NORCO) 5-325 MG tablet Take 1 tablet by mouth every 6 (six) hours as needed for moderate pain. 30 tablet 0  . HYDROmorphone (DILAUDID) 2 MG tablet Take 1 tablet (2 mg total) by mouth every 4 (four) hours as needed for severe pain. 60 tablet 0  . leuprolide (LUPRON) 30 MG injection Inject 30 mg into the muscle every 4 (four) months.    . predniSONE (DELTASONE) 5 MG tablet TAKE 1 TABLET (5 MG TOTAL) BY MOUTH 2 (TWO) TIMES DAILY. 60 tablet 2  . rOPINIRole (REQUIP) 2 MG tablet Take 2 mg by mouth 2 (two) times daily.     No current facility-administered  medications for this visit.      Allergies:  Allergies  Allergen Reactions  . Bee Venom Anaphylaxis, Shortness Of Breath and Swelling    Tongue swelling.   . Wasp Venom Anaphylaxis, Shortness Of Breath and Swelling    Tongue swelling   . Percocet [Oxycodone-Acetaminophen] Palpitations    His past medical history was reviewed today and is unchanged.    Physical Exam: Blood pressure (!) 147/75, pulse 65, temperature 98.9 F (37.2 C), temperature source Oral, resp. rate 18, height 5'  5" (1.651 m), weight 163 lb 4.8 oz (74.1 kg), SpO2 100 %. ECOG: 0 General appearance: Well-appearing gentleman without distress. Head: Normocephalic, without obvious abnormality no oral ulcers or thrush. Neck: no adenopathy or thyromegaly. Lymph nodes: Cervical, supraclavicular, and axillary nodes normal. Heart:regular rate and rhythm, S1, S2 normal, no murmur, click, rub or gallop Lung:chest clear, no wheezing, rales, normal symmetric air entry Abdomen: soft, non-tender, without masses or organomegaly no shifting dullness or ascites. EXT:no erythema, induration, or nodules Neurological examination: No motor or sensory deficits.   Lab Results: Lab Results  Component Value Date   WBC 9.9 12/11/2016   HGB 12.8 (L) 12/11/2016   HCT 39.4 12/11/2016   MCV 81.5 12/11/2016   PLT 238 12/11/2016     Chemistry      Component Value Date/Time   NA 146 (H) 12/11/2016 1522   K 3.1 (L) 12/11/2016 1522   CL 100 (L) 11/27/2016 1302   CL 105 12/08/2012 1504   CO2 33 (H) 12/11/2016 1522   BUN 19.2 12/11/2016 1522   CREATININE 1.1 12/11/2016 1522      Component Value Date/Time   CALCIUM 10.5 (H) 12/11/2016 1522   ALKPHOS 78 12/11/2016 1522   AST 18 12/11/2016 1522   ALT 10 12/11/2016 1522   BILITOT 0.57 12/11/2016 1522     Results for HILBERTO, BURZYNSKI (MRN [id number]) as of 12/11/2016 15:36  Ref. Range 08/21/2016 08:32 10/07/2016 14:38 11/18/2016 09:19  PSA Latest Ref Range: 0.0 - 4.0 ng/mL 25.4 (H) 26.5 (H) 24.6 (H)      EXAM: MRI THORACIC AND LUMBAR SPINE WITHOUT AND WITH CONTRAST  TECHNIQUE: Multiplanar and multiecho pulse sequences of the thoracic and lumbar spine were obtained without and with intravenous contrast.  CONTRAST:  61mL MULTIHANCE GADOBENATE DIMEGLUMINE 529 MG/ML IV SOLN  COMPARISON:  CT scan dated 05/02/2015  FINDINGS: MRI THORACIC SPINE FINDINGS  Alignment: 2 mm spondylolisthesis at T8-9 due to severe and disc degeneration. No other abnormal alignment of the  thoracic spine.  Vertebrae: Multiple blastic metastases are present including T2, T4, T6, T9, T11 and T12, unchanged since the prior CT scan.  Cord: There is thoracic spinal stenosis at T8-9 due to a small broad-based disc protrusion as well as 2 mm spondylolisthesis. The spinal cord is slightly compressed without myelopathy. This is not due to tumor. There is bilateral foraminal stenosis at T9-10, right worse than left which could affect the T9 nerves, more likely on the right than the left. There is edema in the right paraspinal soft tissues at T9-10 with enhancement of those tissues after contrast administration. There is a blastic metastasis in the right pedicle of T10. There is suggestion of soft disc material in the right neural foramen at T9-10. I cannot exclude the possibility of this representing tumor although I think it is more likely to represent disc material.  Paraspinal and other soft tissues: Negative  Disc levels:  There is diffuse degeneration of the discs throughout the  thoracic spine with tiny disc bulges at multiple levels, most prominent at T9-10 and to a lesser degree at T6-7, T7-8, and T8-9. There is also a small broad-based disc bulge at T11-12. There is no neural impingement except at T9-10, as described above. The  MRI LUMBAR SPINE FINDINGS  Segmentation:  Standard.  Alignment:  Physiologic.  Vertebrae: Numerous blastic metastases throughout the lumbar spine, essentially unchanged since the prior CT scan of 05/02/2015.  Conus medullaris: Extends to the L1 level and appears normal.  Paraspinal and other soft tissues: There is left iliac adenopathy best seen on image 29 of series 22 which probably represents metastatic prostate cancer.  Disc levels:  T12-L1:  Negative.  L1-2: Disc desiccation with a small broad-based disc bulge without neural impingement.  L2-3: Disc desiccation with a small broad-based disc bulge. 5  mm synovial cyst arises from the left facet joint and slightly indents the posterolateral aspect of the thecal sac at that level without focal neural impingement.  L3-4: Chronic disc space narrowing. Small asymmetric disc bulge to the right without neural impingement. Slight hypertrophy of the ligamentum flavum.  L4-5: Small broad-based disc bulge with a soft disc protrusion into the right neural foramen. Moderately severe right facet arthritis with moderately severe right foraminal stenosis which could affect the right L4 nerve. Slight hypertrophy of the ligamentum flavum. Mild spinal stenosis.  L5-S1: Chronic disc space narrowing. Small broad-based soft disc protrusion extending into both neural foramina. Severe low bilateral foraminal stenosis, left worse than right which could affect either or both L5 nerves.  After contrast administration there is enhancement around the right facet joint at L4-5 around the left facet joint at L5-S1 consistent with facet joint inflammation. There is no enhancing tumor in the lumbar spine.  IMPRESSION: 1. Multiple stable blastic metastases in the lumbar spine. Left iliac chain adenopathy probably representing metastatic disease. 2. Severe right foraminal stenosis at L4-5 and severe bilateral foraminal stenosis at L5-S1. 3. Inflammation of the right facet joint at L4-5 and of the left facet joint at L5-S1. 4. No findings of tumor within the spinal canal.      Impression and Plan:   71 year old gentleman with the following issues:    1. 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 He has an elevated PSA up to 33 in May of 2013.   He is currently on Zytiga and have tolerated it well since 2013. He continues to tolerate this medication without any complications.    Bone scan in January 2018 continue to show stable disease.  Risks and benefits of continuing Zytiga were reviewed today and he is  agreeable to continue. His PSA remains relatively stable at this time.   2. Bone directed therapy. On Xgeva monthly with calcium supplementation. Risks and benefits of continuing Xgeva at this time were reviewed including long-term dental complications such as osteonecrosis of the jaw. He is not objections continuing at this time.  3. Androgen deprivation: This will be repeated on 10/07/2016 and every 4 months after that.  4. Lower back pain/ hip pain: MRI showed some spinal stenosis as well as metastatic disease. There is no epidural involvement. His pain clinically improved without any neurological deficits. We will hold off on any additional therapy at this time given that his pain is predominantly related to spinal stenosis rather than metastatic disease. He has hydrocodone to use as needed.   5. Followup: 5 weeks to follow-up.    Zola Button, MD 6/21/20184:10 PM

## 2016-12-12 ENCOUNTER — Ambulatory Visit (HOSPITAL_BASED_OUTPATIENT_CLINIC_OR_DEPARTMENT_OTHER): Payer: Medicare Other

## 2016-12-12 VITALS — BP 148/88 | HR 74 | Temp 98.2°F | Resp 18

## 2016-12-12 DIAGNOSIS — C7951 Secondary malignant neoplasm of bone: Secondary | ICD-10-CM | POA: Diagnosis not present

## 2016-12-12 DIAGNOSIS — C61 Malignant neoplasm of prostate: Secondary | ICD-10-CM | POA: Diagnosis not present

## 2016-12-12 LAB — PSA: PROSTATE SPECIFIC AG, SERUM: 37.9 ng/mL — AB (ref 0.0–4.0)

## 2016-12-12 MED ORDER — DENOSUMAB 120 MG/1.7ML ~~LOC~~ SOLN
120.0000 mg | Freq: Once | SUBCUTANEOUS | Status: AC
  Administered 2016-12-12: 120 mg via SUBCUTANEOUS
  Filled 2016-12-12: qty 1.7

## 2017-01-07 ENCOUNTER — Ambulatory Visit (INDEPENDENT_AMBULATORY_CARE_PROVIDER_SITE_OTHER): Payer: Medicare Other | Admitting: Student

## 2017-01-07 ENCOUNTER — Encounter: Payer: Self-pay | Admitting: Student

## 2017-01-07 VITALS — BP 140/80 | HR 69 | Temp 98.5°F | Ht 65.0 in | Wt 164.8 lb

## 2017-01-07 DIAGNOSIS — R590 Localized enlarged lymph nodes: Secondary | ICD-10-CM

## 2017-01-07 DIAGNOSIS — Z7689 Persons encountering health services in other specified circumstances: Secondary | ICD-10-CM | POA: Diagnosis not present

## 2017-01-07 DIAGNOSIS — I10 Essential (primary) hypertension: Secondary | ICD-10-CM

## 2017-01-07 DIAGNOSIS — E785 Hyperlipidemia, unspecified: Secondary | ICD-10-CM

## 2017-01-07 MED ORDER — HYDROCHLOROTHIAZIDE 25 MG PO TABS: 25.0000 mg | ORAL_TABLET | Freq: Every day | ORAL | 0 refills | Status: DC

## 2017-01-07 NOTE — Patient Instructions (Signed)
It was great seeing you today! We have addressed the following issues today 1. Blood pressure: Your blood pressure is 140/80. Your goal blood pressure is less than 140/90. We have refilled your blood pressure medication (hydrochlorothiazide).  2.   We will would like to see you back in clinic in about a month after we receive your medical records from your previous provider.   If we did any lab work today, and the results require attention, either me or my nurse will get in touch with you. If everything is normal, you will get a letter in mail and a message via . If you don't hear from [place] in two weeks, please give [place] a call. Otherwise, we look forward to seeing you again at your next visit. If you have any questions or concerns before then, please call the clinic at [phone].  Please bring all your medications to every doctors visit  Sign up for My Chart to have easy access to your labs results, and communication with your Primary care physician.    Please check-out at the front desk before leaving the clinic.    Take Care,   Dr. Cyndia Skeeters

## 2017-01-08 DIAGNOSIS — R590 Localized enlarged lymph nodes: Secondary | ICD-10-CM | POA: Insufficient documentation

## 2017-01-08 DIAGNOSIS — I10 Essential (primary) hypertension: Secondary | ICD-10-CM | POA: Insufficient documentation

## 2017-01-08 DIAGNOSIS — E785 Hyperlipidemia, unspecified: Secondary | ICD-10-CM | POA: Insufficient documentation

## 2017-01-08 DIAGNOSIS — Z0000 Encounter for general adult medical examination without abnormal findings: Secondary | ICD-10-CM | POA: Insufficient documentation

## 2017-01-08 LAB — BASIC METABOLIC PANEL
BUN / CREAT RATIO: 19 (ref 10–24)
BUN: 17 mg/dL (ref 8–27)
CHLORIDE: 99 mmol/L (ref 96–106)
CO2: 27 mmol/L (ref 20–29)
Calcium: 9.8 mg/dL (ref 8.6–10.2)
Creatinine, Ser: 0.91 mg/dL (ref 0.76–1.27)
GFR calc non Af Amer: 84 mL/min/{1.73_m2} (ref >59)
GFR, EST AFRICAN AMERICAN: 98 mL/min/{1.73_m2} (ref >59)
GLUCOSE: 94 mg/dL (ref 65–99)
POTASSIUM: 4.8 mmol/L (ref 3.5–5.2)
SODIUM: 144 mmol/L (ref 134–144)

## 2017-01-08 LAB — LIPID PANEL
CHOLESTEROL TOTAL: 201 mg/dL — AB (ref 100–199)
Chol/HDL Ratio: 3.7 ratio (ref 0.0–5.0)
HDL: 54 mg/dL (ref >39)
LDL Calculated: 114 mg/dL — ABNORMAL HIGH (ref 0–99)
Triglycerides: 166 mg/dL — ABNORMAL HIGH (ref 0–149)
VLDL CHOLESTEROL CAL: 33 mg/dL (ref 5–40)

## 2017-01-08 NOTE — Assessment & Plan Note (Signed)
BP within normal limit. Reports tolerating his HCTZ.  Check BMP today Refilled his HCTZ today

## 2017-01-08 NOTE — Assessment & Plan Note (Addendum)
Reviewed and updated history. Patient signed medical release form today. He will return in a month for full physical exam. Patient to bring his medication bottles to that visit.

## 2017-01-08 NOTE — Assessment & Plan Note (Signed)
Fasting lipid panel today. 

## 2017-01-08 NOTE — Assessment & Plan Note (Signed)
Two mobile lymph nodes about 1 cm along the long axis in anterior cervical triangles bilaterally. Will monitor closely given history of advanced prostate cancer.

## 2017-01-08 NOTE — Progress Notes (Signed)
Subjective:    [name] Mathews is a 71 y.o. old male here to establish care. He has no concern today.   HPI Patient presented to ED for headache and elevated home blood pressure about a month ago. He was started on HCTZ and discharged home from ED to follow up with PCP. He used to see Dr. Trisha Mangle at  [facility] in [place], [place]. However, he recently moved to [place]. So, he came to our clinic to establish care. He denies headache, chest pain, shortness of breath, leg swelling or focal neuro symptoms today.  Patient has history of stage IV prostate cancer. He is followed by oncologist. He is on treatment for this. He denies constitutional symptoms today.   PMH/Problem List: has Recurrent prostate adenocarcinoma; Back pain; Encounter to establish care; Essential hypertension; Hyperlipidemia; and Cervical lymphadenopathy on his problem list.   has a past medical history of Hyperlipidemia; Hypertension; Prostate cancer ([place]); and Restless leg syndrome.  FH:  Family History  Problem Relation Age of Onset  . Heart attack Mother 36       died of heart attack  . Cervical cancer Sister 47  . Stroke Brother 7    [place] Social History  Substance Use Topics  . Smoking status: Former Smoker    Packs/day: 0.25    Years: 6.00    Start date: 1969    Quit date: 1975  . Smokeless tobacco: Never Used  . Alcohol use No    Review of Systems  Constitutional: Negative for diaphoresis, fatigue, fever and unexpected weight change.  HENT: Negative for congestion, hearing loss, trouble swallowing and voice change.   Eyes: Negative for visual disturbance.  Respiratory: Negative for cough, chest tightness and shortness of breath.   Cardiovascular: Negative for chest pain, palpitations and leg swelling.  Gastrointestinal: Negative for abdominal pain, blood in stool and diarrhea.  Endocrine: Negative for cold intolerance, heat intolerance, polydipsia, polyphagia and polyuria.    Genitourinary: Negative for dysuria, frequency, hematuria and scrotal swelling.       Heistancy  Musculoskeletal: Negative for arthralgias and myalgias.  Skin: Negative for rash.  Neurological: Negative for dizziness, light-headedness and headaches.  Hematological: Negative for adenopathy. Does not bruise/bleed easily.  Psychiatric/Behavioral: Negative for dysphoric mood. The patient is not nervous/anxious.       Objective:     Vitals:   01/07/17 1114  BP: 140/80  Pulse: 69  Temp: 98.5 F (36.9 C)  TempSrc: Oral  SpO2: 96%  Weight: 74.8 kg (164 lb 12.8 oz)  Height: 5\' 5"  (1.651 m)    Physical Exam GEN: appears well, no apparent distress. Head: normocephalic and atraumatic  Eyes: conjunctiva without injection, sclera anicteric Ears: external ear and ear canal normal Nares: no rhinorrhea, congestion or erythema Oropharynx: mmm without erythema or exudation HEM: two mobile lymph nodes about 1 cm along the long axis in anterior cervical triangles bilaterally. No  Periauricular or occipital lymphadenopathies ENDO: negative thyromegally CVS: RRR, nl S1&S2, no murmurs, no edema RESP: no IWOB, good air movement bilaterally, CTAB GI: BS present & normal, soft, NTND GU: no suprapubic MSK: no focal tenderness or notable swelling SKIN: no apparent skin lesion NEURO: alert and oiented appropriately, no gross defecits  PSYCH: euthymic mood with congruent affect    Assessment and Plan:  Encounter to establish care Reviewed and updated history. Patient signed medical release form today. He will return in a month for full physical exam  Essential hypertension BP within normal limit. Reports tolerating his HCTZ.  Check BMP today Refilled his HCTZ today  Hyperlipidemia Fasting lipid panel today  Cervical lymphadenopathy Two mobile lymph nodes about 1 cm along the long axis in anterior cervical triangles bilaterally. Will monitor closely given history of advanced prostate  cancer.   Orders Placed This Encounter  Procedures  . Basic metabolic panel  . Lipid panel   Meds ordered this encounter  Medications  . hydrochlorothiazide (HYDRODIURIL) 25 MG tablet    Sig: Take 1 tablet (25 mg total) by mouth daily.    Dispense:  30 tablet    Refill:  0   Return in about 1 month (around 02/07/2017) for Annual physical.  Mercy Riding, MD 01/08/17 Pager: [phone]

## 2017-01-15 ENCOUNTER — Ambulatory Visit (HOSPITAL_BASED_OUTPATIENT_CLINIC_OR_DEPARTMENT_OTHER): Payer: Medicare Other | Admitting: Oncology

## 2017-01-15 ENCOUNTER — Encounter: Payer: Self-pay | Admitting: Student

## 2017-01-15 ENCOUNTER — Ambulatory Visit (HOSPITAL_BASED_OUTPATIENT_CLINIC_OR_DEPARTMENT_OTHER): Payer: Medicare Other

## 2017-01-15 ENCOUNTER — Telehealth: Payer: Self-pay | Admitting: Student

## 2017-01-15 ENCOUNTER — Other Ambulatory Visit (HOSPITAL_BASED_OUTPATIENT_CLINIC_OR_DEPARTMENT_OTHER): Payer: Medicare Other

## 2017-01-15 VITALS — BP 132/68 | HR 83 | Temp 98.6°F | Resp 18 | Ht 65.0 in | Wt 166.0 lb

## 2017-01-15 DIAGNOSIS — C7951 Secondary malignant neoplasm of bone: Secondary | ICD-10-CM

## 2017-01-15 DIAGNOSIS — C61 Malignant neoplasm of prostate: Secondary | ICD-10-CM

## 2017-01-15 DIAGNOSIS — E291 Testicular hypofunction: Secondary | ICD-10-CM

## 2017-01-15 LAB — CBC WITH DIFFERENTIAL/PLATELET
BASO%: 0.2 % (ref 0.0–2.0)
BASOS ABS: 0 10*3/uL (ref 0.0–0.1)
EOS%: 0.8 % (ref 0.0–7.0)
Eosinophils Absolute: 0.1 10*3/uL (ref 0.0–0.5)
HEMATOCRIT: 39.1 % (ref 38.4–49.9)
HEMOGLOBIN: 12.6 g/dL — AB (ref 13.0–17.1)
LYMPH#: 1.7 10*3/uL (ref 0.9–3.3)
LYMPH%: 14 % (ref 14.0–49.0)
MCH: 26.7 pg — AB (ref 27.2–33.4)
MCHC: 32.3 g/dL (ref 32.0–36.0)
MCV: 82.5 fL (ref 79.3–98.0)
MONO#: 0.6 10*3/uL (ref 0.1–0.9)
MONO%: 4.8 % (ref 0.0–14.0)
NEUT%: 80.2 % — ABNORMAL HIGH (ref 39.0–75.0)
NEUTROS ABS: 9.5 10*3/uL — AB (ref 1.5–6.5)
Platelets: 222 10*3/uL (ref 140–400)
RBC: 4.73 10*6/uL (ref 4.20–5.82)
RDW: 15.2 % — AB (ref 11.0–14.6)
WBC: 11.8 10*3/uL — AB (ref 4.0–10.3)

## 2017-01-15 LAB — COMPREHENSIVE METABOLIC PANEL
ALBUMIN: 3.5 g/dL (ref 3.5–5.0)
ALT: 7 U/L (ref 0–55)
AST: 14 U/L (ref 5–34)
Alkaline Phosphatase: 74 U/L (ref 40–150)
Anion Gap: 11 mEq/L (ref 3–11)
BUN: 20.5 mg/dL (ref 7.0–26.0)
CALCIUM: 9.7 mg/dL (ref 8.4–10.4)
CHLORIDE: 100 meq/L (ref 98–109)
CO2: 33 meq/L — AB (ref 22–29)
Creatinine: 1 mg/dL (ref 0.7–1.3)
EGFR: 86 mL/min/{1.73_m2} — ABNORMAL LOW (ref >90)
GLUCOSE: 97 mg/dL (ref 70–140)
Potassium: 4.2 mEq/L (ref 3.5–5.1)
SODIUM: 143 meq/L (ref 136–145)
TOTAL PROTEIN: 7.2 g/dL (ref 6.4–8.3)
Total Bilirubin: 0.52 mg/dL (ref 0.20–1.20)

## 2017-01-15 MED ORDER — DENOSUMAB 120 MG/1.7ML ~~LOC~~ SOLN
120.0000 mg | Freq: Once | SUBCUTANEOUS | Status: AC
  Administered 2017-01-15: 120 mg via SUBCUTANEOUS
  Filled 2017-01-15: qty 1.7

## 2017-01-15 NOTE — Progress Notes (Signed)
Hematology and Oncology Follow Up Visit  [name] Mathews [id number] 11-05-1945 71 y.o. 01/15/2017 12:52 PM    Principle Diagnosis: 71 year old gentleman with advanced prostate cancer. He has metastatic disease to the bone. He was inially diagnosed  2001, gleason score 4+5= 9.  Prior Therapy: 1. 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 He was started on hormone therapy, initially with Lupron.  2. Casodex was added to firmagon in 2009 because of PSA rise.  3. 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  Current therapy:  Zytiga 1000 mg po daily started in 09/2011 with prednisone 5 mg daily. Xgeva 120 mg started on 10/31/2011. This is given monthly.  Lupron 30 mg subcutaneously to be resumed on 06/14/2015 and every 4 months after that.   Interim History: [name] presents today for a follow up visit. Since the last visit, he reports no recent complaints and feeling well overall. He has resumed most activities of daily living including playing golf. He continues to take as Zytiga without any complications. He has not reported any GI complications such as nausea or abdominal pain. Has not reported any change in his bowel habits. His performance status and quality of life has not dramatically changed. His back pain has resolved at this time without any residual issues.  He does not report any headaches, blurry vision, syncope or seizures. He does not report any fevers or chills or sweats.  He has no shortness of breath, chest pain, cough, swelling, or palpitations. He denies any nausea, vomiting or abdominal pain. He does not report any frequency urgency or hesitancy. The remainder of his detailed review of systems is otherwise unremarkable  Medications: I have reviewed the patient's  current medications. Current Outpatient Prescriptions  Medication Sig Dispense Refill  . abiraterone Acetate (ZYTIGA) 250 MG tablet Take 4 tablets (1,000 mg total) by mouth daily. Take on an empty stomach 1 hour before or 2 hours after a meal 120 tablet 0  . denosumab (XGEVA) 120 MG/1.7ML SOLN Inject 120 mg into the skin every 30 (thirty) days.    . DULoxetine (CYMBALTA) 60 MG capsule Take 60 mg by mouth daily.  2  . EPIPEN 2-PAK 0.3 MG/0.3ML SOAJ injection Reported on 01/04/2016    . hydrochlorothiazide (HYDRODIURIL) 25 MG tablet Take 1 tablet (25 mg total) by mouth daily. 30 tablet 0  . HYDROmorphone (DILAUDID) 2 MG tablet Take 1 tablet (2 mg total) by mouth every 4 (four) hours as needed for severe pain. 60 tablet 0  . leuprolide (LUPRON) 30 MG injection Inject 30 mg into the muscle every 4 (four) months.    . predniSONE (DELTASONE) 5 MG tablet TAKE 1 TABLET (5 MG TOTAL) BY MOUTH 2 (TWO) TIMES DAILY. 60 tablet 2  . rOPINIRole (REQUIP) 2 MG tablet Take 2 mg by mouth 2 (two) times daily.     No current facility-administered medications for this visit.      Allergies:  Allergies  Allergen Reactions  . Bee Venom Anaphylaxis, Shortness Of Breath and Swelling    Tongue swelling.   . Wasp Venom Anaphylaxis, Shortness Of Breath and Swelling    Tongue swelling   . Percocet [Oxycodone-Acetaminophen] Palpitations    His past medical history was reviewed today and is unchanged.    Physical Exam: Blood pressure 132/68,  pulse 83, temperature 98.6 F (37 C), temperature source Oral, resp. rate 18, height 5\' 5"  (1.651 m), weight 166 lb (75.3 kg), SpO2 100 %. ECOG: 0 General appearance: Alert, awake gentleman without distress. Head: Normocephalic, without obvious abnormality no oral ulcers or lesions. Neck: no adenopathy or thyromegaly. Lymph nodes: Cervical, supraclavicular, and axillary nodes normal. Heart:regular rate and rhythm, S1, S2 normal, no murmur, click, rub or gallop Lung:chest  clear, no wheezing, rales, normal symmetric air entry Abdomen: soft, non-tender, without masses or organomegaly no shifting dullness or ascites. No rebound or guarding. EXT:no erythema, induration, or nodules Neurological examination: No motor or sensory deficits.   Lab Results: Lab Results  Component Value Date   WBC 11.8 (H) 01/15/2017   HGB 12.6 (L) 01/15/2017   HCT 39.1 01/15/2017   MCV 82.5 01/15/2017   PLT 222 01/15/2017     Chemistry      Component Value Date/Time   NA 144 01/07/2017 1225   NA 146 (H) 12/11/2016 1522   K 4.8 01/07/2017 1225   K 3.1 (L) 12/11/2016 1522   CL 99 01/07/2017 1225   CL 105 12/08/2012 1504   CO2 27 01/07/2017 1225   CO2 33 (H) 12/11/2016 1522   BUN 17 01/07/2017 1225   BUN 19.2 12/11/2016 1522   CREATININE 0.91 01/07/2017 1225   CREATININE 1.1 12/11/2016 1522      Component Value Date/Time   CALCIUM 9.8 01/07/2017 1225   CALCIUM 10.5 (H) 12/11/2016 1522   ALKPHOS 78 12/11/2016 1522   AST 18 12/11/2016 1522   ALT 10 12/11/2016 1522   BILITOT 0.57 12/11/2016 1522      Results for ABDOULIE, TIERCE (MRN [id number]) as of 01/15/2017 12:46  Ref. Range 10/07/2016 14:38 11/18/2016 09:19 12/11/2016 15:22  Prostate Specific Ag, Serum Latest Ref Range: 0.0 - 4.0 ng/mL 26.5 (H) 24.6 (H) 37.9 (H)     Impression and Plan:   71 year old gentleman with the following issues:    1. 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 He has an elevated PSA up to 33 in May of 2013.   He is currently on Zytiga and have tolerated it well since 2013. He continues to tolerate this medication without any complications.   Bone scan in January 2018 continue to show stable disease.  His PSA is showing a recent rise although his last bone scan continues to show stable disease. Risks and benefits of continuing this medication were reviewed today and is agreeable to continue. His PSA continues to rise, we will restage him with a CT scan and  a bone scan and consider salvage therapy.   2. Bone directed therapy. On Xgeva monthly with calcium supplementation. Risks and benefits of continuing Xgeva at this time were reviewed including long-term dental complications such as osteonecrosis of the jaw. He is not objections continuing at this time.  3. Androgen deprivation: This will be repeated on 10/07/2016 and every 4 months after that.  4. Lower back pain/ hip pain: MRI showed some spinal stenosis and his pain improved.  5. Followup: 6 weeks to follow-up.    Zola Button, MD 7/26/201812:52 PM

## 2017-01-15 NOTE — Patient Instructions (Signed)

## 2017-01-15 NOTE — Telephone Encounter (Signed)
Attempted to call patient again to discuss about his cholesterol. He needs to be on cholesterol medicine.

## 2017-01-16 LAB — PSA: Prostate Specific Ag, Serum: 45.1 ng/mL — ABNORMAL HIGH (ref 0.0–4.0)

## 2017-01-20 ENCOUNTER — Other Ambulatory Visit: Payer: Self-pay | Admitting: *Deleted

## 2017-01-20 DIAGNOSIS — C61 Malignant neoplasm of prostate: Secondary | ICD-10-CM

## 2017-01-20 MED ORDER — ABIRATERONE ACETATE 250 MG PO TABS: 1000.0000 mg | ORAL_TABLET | Freq: Every day | ORAL | 0 refills | Status: DC

## 2017-02-09 ENCOUNTER — Ambulatory Visit (INDEPENDENT_AMBULATORY_CARE_PROVIDER_SITE_OTHER): Payer: Medicare Other | Admitting: Student

## 2017-02-09 ENCOUNTER — Encounter: Payer: Self-pay | Admitting: Student

## 2017-02-09 VITALS — BP 114/70 | HR 60 | Temp 98.0°F | Ht 65.0 in | Wt 168.0 lb

## 2017-02-09 DIAGNOSIS — E785 Hyperlipidemia, unspecified: Secondary | ICD-10-CM

## 2017-02-09 DIAGNOSIS — I10 Essential (primary) hypertension: Secondary | ICD-10-CM | POA: Diagnosis not present

## 2017-02-09 DIAGNOSIS — Z23 Encounter for immunization: Secondary | ICD-10-CM | POA: Diagnosis not present

## 2017-02-09 DIAGNOSIS — Z0000 Encounter for general adult medical examination without abnormal findings: Secondary | ICD-10-CM | POA: Diagnosis not present

## 2017-02-09 DIAGNOSIS — M792 Neuralgia and neuritis, unspecified: Secondary | ICD-10-CM

## 2017-02-09 MED ORDER — ASPIRIN EC 81 MG PO TBEC: 81.0000 mg | DELAYED_RELEASE_TABLET | Freq: Every day | ORAL | 3 refills | Status: DC

## 2017-02-09 MED ORDER — DULOXETINE HCL 60 MG PO CPEP: 60.0000 mg | ORAL_CAPSULE | Freq: Every day | ORAL | 2 refills | Status: DC

## 2017-02-09 MED ORDER — HYDROCHLOROTHIAZIDE 25 MG PO TABS: 25.0000 mg | ORAL_TABLET | Freq: Every day | ORAL | 3 refills | Status: DC

## 2017-02-09 MED ORDER — ATORVASTATIN CALCIUM 20 MG PO TABS: 20.0000 mg | ORAL_TABLET | Freq: Every day | ORAL | 3 refills | Status: DC

## 2017-02-09 MED ORDER — ZOSTER VAC RECOMB ADJUVANTED 50 MCG/0.5ML IM SUSR: 0.5000 mL | Freq: Once | INTRAMUSCULAR | 1 refills | Status: AC

## 2017-02-09 NOTE — Assessment & Plan Note (Signed)
Refilled his duloxetine today.

## 2017-02-09 NOTE — Assessment & Plan Note (Signed)
Patient states having recent colonoscopy. We'll try to obtain this result from his previous provider. Patient has his tetanus and pneumonia vaccines at [place]. We will update his immunization records. Gave prescription for Shingrix. Discussed about the side effects. Discussed about diet and exercise and gave handout as well. Patient has advanced directive and MOST at home. He says he will bring that to clinic at next visit.

## 2017-02-09 NOTE — Assessment & Plan Note (Signed)
Well-controlled on hydrochlorothiazide 25 mg daily. We will continue medication.

## 2017-02-09 NOTE — Assessment & Plan Note (Signed)
Patient has metastatic prostate cancer for the last 20 years. He is still on chemotherapy and doing well. I believe this patient could benefit from statin and aspirin given his ASCVD risk score of 15.4 today. So started on Lipitor 20 mg daily and baby aspirin. No drug drug interaction with his chemotherapy.

## 2017-02-09 NOTE — Progress Notes (Signed)
Subjective:  CC: Annual physical  History of present illness:    [name] Mathews is a 71 y.o. old male here  for annual exam. Patient with metastatic prostate cancer on chemotherapy.  Concern today: none Changes in his/her health in the last 12 months: no Occupation: retired. Worked for [employer].  Wears seatbelt: yes.    The patient has regular exercise: sometimes. Loves walking about three times a week. Walks for two miles.   Enough vegetables and fruits: yes.  Smokes cigarette: quit in 1970's Drinks EtOH: no Drug use: no Patient takes ASA: no.  Patient takes vitD & Ca: yes. Ever been transfused or tattooed?: no.  The patient is not sexually active.  Domestic violence: no.  Advance directive: yes. Patient to bring at next visit MOST: yes. Patient to bring at next visit  History of depression:no.  Patient dental home: yes.  Immunizations  Needs influenza vaccine: not available.  Needs HPV (Women until age 59): not applicable.  Needs Shingrix (all >72yrs of age): yes.  Needs Tdap: had it at [place].  Needs Pneumococcal: had at [facility] Screening Need colon cancer screening: had it in 2014. Need breast cancer ccreening: not applicable. Need cervical cancer Screening: not applicable. STOP BANG >/=3 for OSA: has CPAP at home Need lung cancer screening:no. At risk for skin cancer: no. Need HCV Screening: yes. Need STI Screening: no.  HPI  PMH/Problem List: has Recurrent prostate adenocarcinoma; Back pain; Routine adult health maintenance; Essential hypertension; Hyperlipidemia; Cervical lymphadenopathy; and Neuropathic pain on his problem list.   has a past medical history of Hyperlipidemia; Hypertension; Prostate cancer ([place]); and Restless leg syndrome.  [facility]  Family History  Problem Relation Age of Onset  . Heart attack Mother 56       died of heart attack  . Cervical cancer Sister 35  . Stroke Brother 25   Family history of heart disease before age of 86 yrs:  no. Family history of stroke: no. Family history of cancer: no.  [place] Social History  Substance Use Topics  . Smoking status: Former Smoker    Packs/day: 0.25    Years: 6.00    Start date: 1969    Quit date: 1975  . Smokeless tobacco: Never Used  . Alcohol use No     Review of Systems  Constitutional: Negative for diaphoresis, fatigue, fever and unexpected weight change.  HENT: Negative for congestion, hearing loss, trouble swallowing and voice change.   Eyes: Negative for visual disturbance.  Respiratory: Negative for cough, chest tightness and shortness of breath.   Cardiovascular: Negative for chest pain, palpitations and leg swelling.  Gastrointestinal: Negative for abdominal pain, blood in stool and diarrhea.  Endocrine: Negative for cold intolerance and heat intolerance.  Genitourinary: Positive for dysuria. Negative for frequency, hematuria and scrotal swelling.       Occasionally. Patient with prostate cancer   Musculoskeletal: Positive for arthralgias. Negative for myalgias.  Skin: Negative for rash.  Neurological: Negative for dizziness, light-headedness and headaches.  Hematological: Negative for adenopathy. Does not bruise/bleed easily.  Psychiatric/Behavioral: Negative for dysphoric mood. The patient is not nervous/anxious.         Objective:   Physical Exam Vitals:   02/09/17 0917  BP: 114/70  Pulse: 60  Temp: 98 F (36.7 C)  TempSrc: Oral  SpO2: 98%  Weight: 168 lb (76.2 kg)  Height: 5\' 5"  (1.651 m)   Body mass index is 27.96 kg/m.  GEN: appears well, no apparent distress. Head: normocephalic and  atraumatic  Eyes: conjunctiva without injection, sclera anicteric Ears: external ear and ear canal normal Nares: no rhinorrhea, congestion or erythema Oropharynx: mmm without erythema or exudation, poor dentition HEM: negative for cervical or periauricular lymphadenopathies CVS: RRR, nl s1 & s2, no murmurs, no edema,  2+ DP bil RESP: no IWOB, good air  movement bilaterally, CTAB GI: BS present & normal, soft, NTND, no guarding, no rebound, no mass GU: no suprapubic tenderness MSK: no focal tenderness or notable swelling SKIN: no apparent skin lesion ENDO: negative thyromegally NEURO: alert and oiented appropriately, no gross deficits  PSYCH: euthymic mood with congruent affect    Assessment & Plan:  Routine adult health maintenance Patient states having recent colonoscopy. We'll try to obtain this result from his previous provider. Patient has his tetanus and pneumonia vaccines at [place]. We will update his immunization records. Gave prescription for Shingrix. Discussed about the side effects. Discussed about diet and exercise and gave handout as well. Patient has advanced directive and MOST at home. He says he will bring that to clinic at next visit.   Essential hypertension Well-controlled on hydrochlorothiazide 25 mg daily. We will continue medication.  Hyperlipidemia Patient has metastatic prostate cancer for the last 20 years. He is still on chemotherapy and doing well. I believe this patient could benefit from statin and aspirin given his ASCVD risk score of 15.4 today. So started on Lipitor 20 mg daily and baby aspirin. No drug drug interaction with his chemotherapy.  Neuropathic pain Refilled his duloxetine today.  Follow-up in one year or sooner if needed. Wendee Beavers PGY-3 Pager [phone] 02/09/17  7:25 PM

## 2017-02-09 NOTE — Patient Instructions (Addendum)
It was great seeing you today! We have addressed the following issues today 1. Annual physical: I recommend daily exercise and eating healthy. Please see below for more on this 2.   Colonoscopy: Please contact her previous provider and have them send as you colonoscopy results.  3.   Please bring [place] your advance directive and MOST form when you can.   If we did any lab work today, and the results require attention, either me or my nurse will get in touch with you. If everything is normal, you will get a letter in mail and a message via . If you don't hear from [place] in two weeks, please give [place] a call. Otherwise, we look forward to seeing you again at your next visit. If you have any questions or concerns before then, please call the clinic at [phone].  Please bring all your medications to every doctors visit  Sign up for My Chart to have easy access to your labs results, and communication with your Primary care physician.    Please check-out at the front desk before leaving the clinic.    Take Care,   Dr. Cyndia Skeeters  Portion Size    Choose healthier foods such as 100% whole grains, vegetables, fruits, beans, nut seeds, olive oil, most vegetable oils, fat-free dietary, wild game and fish.   Avoid sweet tea, other sweetened beverages, soda, fruit juice, cold cereal and milk and trans fat.   Eat at least 3 meals and 1-2 snacks per day.  Aim for no more than 5 hours between eating.  Eat breakfast within one hour of getting up.    Exercise at least 150 minutes per week, including weight resistance exercises 3 or 4 times per week.

## 2017-03-03 ENCOUNTER — Ambulatory Visit: Payer: Medicare Other

## 2017-03-03 ENCOUNTER — Telehealth: Payer: Self-pay | Admitting: Oncology

## 2017-03-03 ENCOUNTER — Other Ambulatory Visit (HOSPITAL_BASED_OUTPATIENT_CLINIC_OR_DEPARTMENT_OTHER): Payer: Medicare Other

## 2017-03-03 ENCOUNTER — Encounter (HOSPITAL_BASED_OUTPATIENT_CLINIC_OR_DEPARTMENT_OTHER): Payer: Medicare Other

## 2017-03-03 ENCOUNTER — Ambulatory Visit (HOSPITAL_BASED_OUTPATIENT_CLINIC_OR_DEPARTMENT_OTHER): Payer: Medicare Other | Admitting: Oncology

## 2017-03-03 VITALS — BP 164/76 | HR 56 | Temp 97.8°F | Resp 18 | Ht 65.0 in | Wt 170.6 lb

## 2017-03-03 DIAGNOSIS — C7951 Secondary malignant neoplasm of bone: Secondary | ICD-10-CM

## 2017-03-03 DIAGNOSIS — Z5111 Encounter for antineoplastic chemotherapy: Secondary | ICD-10-CM | POA: Diagnosis not present

## 2017-03-03 DIAGNOSIS — C61 Malignant neoplasm of prostate: Secondary | ICD-10-CM

## 2017-03-03 DIAGNOSIS — E291 Testicular hypofunction: Secondary | ICD-10-CM

## 2017-03-03 LAB — CBC WITH DIFFERENTIAL/PLATELET
BASO%: 0.1 % (ref 0.0–2.0)
BASOS ABS: 0 10*3/uL (ref 0.0–0.1)
EOS%: 1.5 % (ref 0.0–7.0)
Eosinophils Absolute: 0.1 10*3/uL (ref 0.0–0.5)
HEMATOCRIT: 34.4 % — AB (ref 38.4–49.9)
HGB: 11 g/dL — ABNORMAL LOW (ref 13.0–17.1)
LYMPH#: 2.4 10*3/uL (ref 0.9–3.3)
LYMPH%: 28.8 % (ref 14.0–49.0)
MCH: 27 pg — AB (ref 27.2–33.4)
MCHC: 32 g/dL (ref 32.0–36.0)
MCV: 84.5 fL (ref 79.3–98.0)
MONO#: 0.6 10*3/uL (ref 0.1–0.9)
MONO%: 7.4 % (ref 0.0–14.0)
NEUT%: 62.2 % (ref 39.0–75.0)
NEUTROS ABS: 5.1 10*3/uL (ref 1.5–6.5)
Platelets: 206 10*3/uL (ref 140–400)
RBC: 4.07 10*6/uL — ABNORMAL LOW (ref 4.20–5.82)
RDW: 13.9 % (ref 11.0–14.6)
WBC: 8.2 10*3/uL (ref 4.0–10.3)

## 2017-03-03 LAB — COMPREHENSIVE METABOLIC PANEL
ALBUMIN: 3.2 g/dL — AB (ref 3.5–5.0)
ALK PHOS: 78 U/L (ref 40–150)
ALT: <6 U/L (ref 0–55)
ANION GAP: 9 meq/L (ref 3–11)
AST: 13 U/L (ref 5–34)
BILIRUBIN TOTAL: 0.35 mg/dL (ref 0.20–1.20)
BUN: 22.2 mg/dL (ref 7.0–26.0)
CO2: 33 mEq/L — ABNORMAL HIGH (ref 22–29)
Calcium: 9.3 mg/dL (ref 8.4–10.4)
Chloride: 101 mEq/L (ref 98–109)
Creatinine: 1.1 mg/dL (ref 0.7–1.3)
EGFR: 82 mL/min/{1.73_m2} — AB (ref >90)
GLUCOSE: 105 mg/dL (ref 70–140)
POTASSIUM: 3.1 meq/L — AB (ref 3.5–5.1)
Sodium: 144 mEq/L (ref 136–145)
Total Protein: 6.7 g/dL (ref 6.4–8.3)

## 2017-03-03 MED ORDER — DENOSUMAB 120 MG/1.7ML ~~LOC~~ SOLN
120.0000 mg | Freq: Once | SUBCUTANEOUS | Status: AC
  Administered 2017-03-03: 120 mg via SUBCUTANEOUS
  Filled 2017-03-03: qty 1.7

## 2017-03-03 MED ORDER — LEUPROLIDE ACETATE (4 MONTH) 30 MG IM KIT
30.0000 mg | PACK | Freq: Once | INTRAMUSCULAR | Status: AC
  Administered 2017-03-03: 30 mg via INTRAMUSCULAR
  Filled 2017-03-03: qty 30

## 2017-03-03 NOTE — Progress Notes (Signed)
Hematology and Oncology Follow Up Visit  [name] Mathews [id number] Aug 15, 1945 71 y.o. 03/03/2017 10:33 AM    Principle Diagnosis: 71 year old gentleman with advanced prostate cancer. He has metastatic disease to the bone. He was inially diagnosed  2001, gleason score 4+5= 9.  Prior Therapy: 1. 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 He was started on hormone therapy, initially with Lupron.  2. Casodex was added to firmagon in 2009 because of PSA rise.  3. 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  Current therapy:  Zytiga 1000 mg po daily started in 09/2011 with prednisone 5 mg daily. Xgeva 120 mg started on 10/31/2011. This is given monthly.  Lupron 30 mg subcutaneously to be resumed on 06/14/2015 and every 4 months after that.   Interim History: [name] Mathews presents today for a follow up visit. Since the last visit, he reports feeling well without any new issues. He continues to take as Zytiga without any complications. He has not reported any GI complications such as nausea or abdominal pain. Has not reported any change in his bowel habits. His performance status and quality of life remains excellent. He denied any flank pain or urination difficulties. His appetite remains excellent and have gained some weight. He denied any early satiety or change in his bowel habits.  He does not report any headaches, blurry vision, syncope or seizures. He does not report any fevers or chills or sweats.  He has no shortness of breath, chest pain, cough, swelling, or palpitations. He denies any nausea, vomiting or abdominal pain. He does not report any frequency urgency or hesitancy. The remainder of his detailed review of systems is otherwise unremarkable  Medications: I have reviewed the  patient's current medications. Current Outpatient Prescriptions  Medication Sig Dispense Refill  . abiraterone Acetate (ZYTIGA) 250 MG tablet Take 4 tablets (1,000 mg total) by mouth daily. Take on an empty stomach 1 hour before or 2 hours after a meal 120 tablet 0  . aspirin EC 81 MG tablet Take 1 tablet (81 mg total) by mouth daily. 90 tablet 3  . atorvastatin (LIPITOR) 20 MG tablet Take 1 tablet (20 mg total) by mouth daily. 90 tablet 3  . denosumab (XGEVA) 120 MG/1.7ML SOLN Inject 120 mg into the skin every 30 (thirty) days.    . DULoxetine (CYMBALTA) 60 MG capsule Take 1 capsule (60 mg total) by mouth daily. 90 capsule 2  . EPIPEN 2-PAK 0.3 MG/0.3ML SOAJ injection Reported on 01/04/2016    . hydrochlorothiazide (HYDRODIURIL) 25 MG tablet Take 1 tablet (25 mg total) by mouth daily. 90 tablet 3  . HYDROmorphone (DILAUDID) 2 MG tablet Take 1 tablet (2 mg total) by mouth every 4 (four) hours as needed for severe pain. 60 tablet 0  . leuprolide (LUPRON) 30 MG injection Inject 30 mg into the muscle every 4 (four) months.    . predniSONE (DELTASONE) 5 MG tablet TAKE 1 TABLET (5 MG TOTAL) BY MOUTH 2 (TWO) TIMES DAILY. 60 tablet 2  . rOPINIRole (REQUIP) 2 MG tablet Take 2 mg by mouth 2 (two) times daily.     No current facility-administered medications for this visit.      Allergies:  Allergies  Allergen Reactions  . Bee Venom Anaphylaxis, Shortness Of Breath and Swelling    Tongue  swelling.   . Wasp Venom Anaphylaxis, Shortness Of Breath and Swelling    Tongue swelling   . Percocet [Oxycodone-Acetaminophen] Palpitations    His past medical history was reviewed today and is unchanged.    Physical Exam: Blood pressure (!) 164/76, pulse (!) 56, temperature 97.8 F (36.6 C), temperature source Oral, resp. rate 18, height 5\' 5"  (1.651 m), weight 170 lb 9.6 oz (77.4 kg), SpO2 100 %. ECOG: 0 General appearance: Well-appearing gentleman without distress. Head: Normocephalic, without  obvious abnormality no oral ulcers or lesions. Neck: no adenopathy or neck masses. Lymph nodes: Cervical, supraclavicular, and axillary nodes normal. Heart:regular rate and rhythm, S1, S2 normal, no murmur, click, rub or gallop Lung:chest clear, no wheezing, rales, normal symmetric air entry but most of percussion. Abdomen: soft, non-tender, without masses or organomegaly no rebound or guarding. EXT:no erythema, induration, or nodules Neurological examination: No motor or sensory deficits.   Lab Results: Lab Results  Component Value Date   WBC 8.2 03/03/2017   HGB 11.0 (L) 03/03/2017   HCT 34.4 (L) 03/03/2017   MCV 84.5 03/03/2017   PLT 206 03/03/2017     Chemistry      Component Value Date/Time   NA 143 01/15/2017 1228   K 4.2 01/15/2017 1228   CL 99 01/07/2017 1225   CL 105 12/08/2012 1504   CO2 33 (H) 01/15/2017 1228   BUN 20.5 01/15/2017 1228   CREATININE 1.0 01/15/2017 1228      Component Value Date/Time   CALCIUM 9.7 01/15/2017 1228   ALKPHOS 74 01/15/2017 1228   AST 14 01/15/2017 1228   ALT 7 01/15/2017 1228   BILITOT 0.52 01/15/2017 1228      Results for [name] Mathews (MRN [id number]) as of 03/03/2017 10:10  Ref. Range 11/18/2016 09:19 12/11/2016 15:22 01/15/2017 12:28  Prostate Specific Ag, Serum Latest Ref Range: 0.0 - 4.0 ng/mL 24.6 (H) 37.9 (H) 45.1 (H)      Impression and Plan:   71 year old gentleman with the following issues:    1. 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 He has an elevated PSA up to 33 in May of 2013.   He is currently on Zytiga and have tolerated it well since 2013. He continues to tolerate this medication without any complications.   Bone scan in January 2018 continue to show stable disease.  His PSA is rising although he is completely asymptomatic. The plan is to restage him with CT scan and a bone scan before the next visit and different salvage therapy could be considered if he shows  progression of disease. I will be in the form of chemotherapy or extend the period   2. Bone directed therapy. On Xgeva monthly with calcium supplementation. Risks and benefits of continuing Xgeva at this time were reviewed including long-term dental complications such as osteonecrosis of the jaw. He is not objections continuing at this time.  3. Androgen deprivation: This will be repeated on 03/03/2017.  4. Lower back pain/ hip pain: MRI showed some spinal stenosis and his pain improved.  5. Followup: 6 weeks to follow-up.    Zola Button, MD 9/11/201810:33 AM

## 2017-03-03 NOTE — Telephone Encounter (Signed)
Gave avs and calendar for october °

## 2017-03-03 NOTE — Patient Instructions (Signed)
Denosumab injection What is this medicine? DENOSUMAB (den oh sue mab) slows bone breakdown. Prolia is used to treat osteoporosis in women after menopause and in men. Delton See is used to treat a high calcium level due to cancer and to prevent bone fractures and other bone problems caused by multiple myeloma or cancer bone metastases. Delton See is also used to treat giant cell tumor of the bone. This medicine may be used for other purposes; ask your health care provider or pharmacist if you have questions. COMMON BRAND NAME(S): Prolia, XGEVA What should I tell my health care provider before I take this medicine? They need to know if you have any of these conditions: -dental disease -having surgery or tooth extraction -infection -kidney disease -low levels of calcium or Vitamin D in the blood -malnutrition -on hemodialysis -skin conditions or sensitivity -thyroid or parathyroid disease -an unusual reaction to denosumab, other medicines, foods, dyes, or preservatives -pregnant or trying to get pregnant -breast-feeding How should I use this medicine? This medicine is for injection under the skin. It is given by a health care professional in a hospital or clinic setting. If you are getting Prolia, a special MedGuide will be given to you by the pharmacist with each prescription and refill. Be sure to read this information carefully each time. For Prolia, talk to your pediatrician regarding the use of this medicine in children. Special care may be needed. For Delton See, talk to your pediatrician regarding the use of this medicine in children. While this drug may be prescribed for children as young as 13 years for selected conditions, precautions do apply. Overdosage: If you think you have taken too much of this medicine contact a poison control center or emergency room at once. NOTE: This medicine is only for you. Do not share this medicine with others. What if I miss a dose? It is important not to miss your  dose. Call your doctor or health care professional if you are unable to keep an appointment. What may interact with this medicine? Do not take this medicine with any of the following medications: -other medicines containing denosumab This medicine may also interact with the following medications: -medicines that lower your chance of fighting infection -steroid medicines like prednisone or cortisone This list may not describe all possible interactions. Give your health care provider a list of all the medicines, herbs, non-prescription drugs, or dietary supplements you use. Also tell them if you smoke, drink alcohol, or use illegal drugs. Some items may interact with your medicine. What should I watch for while using this medicine? Visit your doctor or health care professional for regular checks on your progress. Your doctor or health care professional may order blood tests and other tests to see how you are doing. Call your doctor or health care professional for advice if you get a fever, chills or sore throat, or other symptoms of a cold or flu. Do not treat yourself. This drug may decrease your body's ability to fight infection. Try to avoid being around people who are sick. You should make sure you get enough calcium and vitamin D while you are taking this medicine, unless your doctor tells you not to. Discuss the foods you eat and the vitamins you take with your health care professional. See your dentist regularly. Brush and floss your teeth as directed. Before you have any dental work done, tell your dentist you are receiving this medicine. Do not become pregnant while taking this medicine or for 5 months after stopping  it. Talk with your doctor or health care professional about your birth control options while taking this medicine. Women should inform their doctor if they wish to become pregnant or think they might be pregnant. There is a potential for serious side effects to an unborn child. Talk  to your health care professional or pharmacist for more information. What side effects may I notice from receiving this medicine? Side effects that you should report to your doctor or health care professional as soon as possible: -allergic reactions like skin rash, itching or hives, swelling of the face, lips, or tongue -bone pain -breathing problems -dizziness -jaw pain, especially after dental work -redness, blistering, peeling of the skin -signs and symptoms of infection like fever or chills; cough; sore throat; pain or trouble passing urine -signs of low calcium like fast heartbeat, muscle cramps or muscle pain; pain, tingling, numbness in the hands or feet; seizures -unusual bleeding or bruising -unusually weak or tired Side effects that usually do not require medical attention (report to your doctor or health care professional if they continue or are bothersome): -constipation -diarrhea -headache -joint pain -loss of appetite -muscle pain -runny nose -tiredness -upset stomach This list may not describe all possible side effects. Call your doctor for medical advice about side effects. You may report side effects to FDA at 1-800-FDA-1088. Where should I keep my medicine? This medicine is only given in a clinic, doctor's office, or other health care setting and will not be stored at home. NOTE: This sheet is a summary. It may not cover all possible information. If you have questions about this medicine, talk to your doctor, pharmacist, or health care provider.  2018 Elsevier/Gold Standard (2016-07-01 19:17:21) Leuprolide depot injection What is this medicine? LEUPROLIDE (loo PROE lide) is a man-made protein that acts like a natural hormone in the body. It decreases testosterone in men and decreases estrogen in women. In men, this medicine is used to treat advanced prostate cancer. In women, some forms of this medicine may be used to treat endometriosis, uterine fibroids, or other  male hormone-related problems. This medicine may be used for other purposes; ask your health care provider or pharmacist if you have questions. COMMON BRAND NAME(S): Eligard, Lupron Depot, Lupron Depot-Ped, Viadur What should I tell my health care provider before I take this medicine? They need to know if you have any of these conditions: -diabetes -heart disease or previous heart attack -high blood pressure -high cholesterol -mental illness -osteoporosis -pain or difficulty passing urine -seizures -spinal cord metastasis -stroke -suicidal thoughts, plans, or attempt; a previous suicide attempt by you or a family member -tobacco smoker -unusual vaginal bleeding (women) -an unusual or allergic reaction to leuprolide, benzyl alcohol, other medicines, foods, dyes, or preservatives -pregnant or trying to get pregnant -breast-feeding How should I use this medicine? This medicine is for injection into a muscle or for injection under the skin. It is given by a health care professional in a hospital or clinic setting. The specific product will determine how it will be given to you. Make sure you understand which product you receive and how often you will receive it. Talk to your pediatrician regarding the use of this medicine in children. Special care may be needed. Overdosage: If you think you have taken too much of this medicine contact a poison control center or emergency room at once. NOTE: This medicine is only for you. Do not share this medicine with others. What if I miss a dose? It is  important not to miss a dose. Call your doctor or health care professional if you are unable to keep an appointment. Depot injections: Depot injections are given either once-monthly, every 12 weeks, every 16 weeks, or every 24 weeks depending on the product you are prescribed. The product you are prescribed will be based on if you are male or male, and your condition. Make sure you understand your  product and dosing. What may interact with this medicine? Do not take this medicine with any of the following medications: -chasteberry This medicine may also interact with the following medications: -herbal or dietary supplements, like black cohosh or DHEA -male hormones, like estrogens or progestins and birth control pills, patches, rings, or injections -male hormones, like testosterone This list may not describe all possible interactions. Give your health care provider a list of all the medicines, herbs, non-prescription drugs, or dietary supplements you use. Also tell them if you smoke, drink alcohol, or use illegal drugs. Some items may interact with your medicine. What should I watch for while using this medicine? Visit your doctor or health care professional for regular checks on your progress. During the first weeks of treatment, your symptoms may get worse, but then will improve as you continue your treatment. You may get hot flashes, increased bone pain, increased difficulty passing urine, or an aggravation of nerve symptoms. Discuss these effects with your doctor or health care professional, some of them may improve with continued use of this medicine. Male patients may experience a menstrual cycle or spotting during the first months of therapy with this medicine. If this continues, contact your doctor or health care professional. What side effects may I notice from receiving this medicine? Side effects that you should report to your doctor or health care professional as soon as possible: -allergic reactions like skin rash, itching or hives, swelling of the face, lips, or tongue -breathing problems -chest pain -depression or memory disorders -pain in your legs or groin -pain at site where injected or implanted -seizures -severe headache -swelling of the feet and legs -suicidal thoughts or other mood changes -visual changes -vomiting Side effects that usually do not require  medical attention (report to your doctor or health care professional if they continue or are bothersome): -breast swelling or tenderness -decrease in sex drive or performance -diarrhea -hot flashes -loss of appetite -muscle, joint, or bone pains -nausea -redness or irritation at site where injected or implanted -skin problems or acne This list may not describe all possible side effects. Call your doctor for medical advice about side effects. You may report side effects to FDA at 1-800-FDA-1088. Where should I keep my medicine? This drug is given in a hospital or clinic and will not be stored at home. NOTE: This sheet is a summary. It may not cover all possible information. If you have questions about this medicine, talk to your doctor, pharmacist, or health care provider.  2018 Elsevier/Gold Standard (2015-11-22 09:45:53)

## 2017-03-04 LAB — PSA: PROSTATE SPECIFIC AG, SERUM: 40.7 ng/mL — AB (ref 0.0–4.0)

## 2017-04-07 ENCOUNTER — Ambulatory Visit (HOSPITAL_COMMUNITY)
Admission: RE | Admit: 2017-04-07 | Discharge: 2017-04-07 | Disposition: A | Discharge status: Home / Self Care | Payer: Medicare Other | Source: Ambulatory Visit | Attending: Oncology | Admitting: Oncology

## 2017-04-07 ENCOUNTER — Encounter (HOSPITAL_COMMUNITY)
Admission: RE | Admit: 2017-04-07 | Discharge: 2017-04-07 | Disposition: A | Discharge status: Home / Self Care | Payer: Medicare Other | Source: Ambulatory Visit | Attending: Oncology | Admitting: Oncology

## 2017-04-07 DIAGNOSIS — C7951 Secondary malignant neoplasm of bone: Secondary | ICD-10-CM | POA: Insufficient documentation

## 2017-04-07 DIAGNOSIS — C61 Malignant neoplasm of prostate: Secondary | ICD-10-CM | POA: Insufficient documentation

## 2017-04-07 DIAGNOSIS — I700 Atherosclerosis of aorta: Secondary | ICD-10-CM | POA: Diagnosis not present

## 2017-04-07 DIAGNOSIS — R590 Localized enlarged lymph nodes: Secondary | ICD-10-CM | POA: Diagnosis not present

## 2017-04-07 MED ORDER — TECHNETIUM TC 99M MEDRONATE IV KIT
21.5000 | PACK | Freq: Once | INTRAVENOUS | Status: AC | PRN
  Administered 2017-04-07: 21.5 via INTRAVENOUS

## 2017-04-07 MED ORDER — IOPAMIDOL (ISOVUE-300) INJECTION 61%
75.0000 mL | Freq: Once | INTRAVENOUS | Status: AC | PRN
  Administered 2017-04-07: 100 mL via INTRAVENOUS

## 2017-04-07 MED ORDER — IOPAMIDOL (ISOVUE-300) INJECTION 61%
INTRAVENOUS | Status: AC
  Filled 2017-04-07: qty 100

## 2017-04-07 MED ORDER — FLUDEOXYGLUCOSE F - 18 (FDG) INJECTION: 21.5000 | Freq: Once | INTRAVENOUS | Status: DC | PRN

## 2017-04-14 ENCOUNTER — Other Ambulatory Visit: Payer: Medicare Other

## 2017-04-15 ENCOUNTER — Ambulatory Visit (HOSPITAL_BASED_OUTPATIENT_CLINIC_OR_DEPARTMENT_OTHER): Payer: Medicare Other

## 2017-04-15 DIAGNOSIS — C7951 Secondary malignant neoplasm of bone: Secondary | ICD-10-CM

## 2017-04-15 DIAGNOSIS — C61 Malignant neoplasm of prostate: Secondary | ICD-10-CM | POA: Diagnosis not present

## 2017-04-15 LAB — COMPREHENSIVE METABOLIC PANEL
ALT: 6 U/L (ref 0–55)
AST: 16 U/L (ref 5–34)
Albumin: 3.1 g/dL — ABNORMAL LOW (ref 3.5–5.0)
Alkaline Phosphatase: 67 U/L (ref 40–150)
Anion Gap: 10 mEq/L (ref 3–11)
BILIRUBIN TOTAL: 0.52 mg/dL (ref 0.20–1.20)
BUN: 16.1 mg/dL (ref 7.0–26.0)
CO2: 29 meq/L (ref 22–29)
Calcium: 8.7 mg/dL (ref 8.4–10.4)
Chloride: 105 mEq/L (ref 98–109)
Creatinine: 0.8 mg/dL (ref 0.7–1.3)
GLUCOSE: 79 mg/dL (ref 70–140)
Potassium: 3.1 mEq/L — ABNORMAL LOW (ref 3.5–5.1)
Sodium: 143 mEq/L (ref 136–145)
Total Protein: 6.5 g/dL (ref 6.4–8.3)

## 2017-04-15 LAB — CBC WITH DIFFERENTIAL/PLATELET
BASO%: 0.3 % (ref 0.0–2.0)
BASOS ABS: 0 10*3/uL (ref 0.0–0.1)
EOS%: 2.3 % (ref 0.0–7.0)
Eosinophils Absolute: 0.2 10*3/uL (ref 0.0–0.5)
HEMATOCRIT: 33.3 % — AB (ref 38.4–49.9)
HEMOGLOBIN: 10.4 g/dL — AB (ref 13.0–17.1)
LYMPH#: 2.1 10*3/uL (ref 0.9–3.3)
LYMPH%: 28.3 % (ref 14.0–49.0)
MCH: 26.5 pg — ABNORMAL LOW (ref 27.2–33.4)
MCHC: 31.2 g/dL — ABNORMAL LOW (ref 32.0–36.0)
MCV: 84.9 fL (ref 79.3–98.0)
MONO#: 0.5 10*3/uL (ref 0.1–0.9)
MONO%: 7.2 % (ref 0.0–14.0)
NEUT#: 4.5 10*3/uL (ref 1.5–6.5)
NEUT%: 61.9 % (ref 39.0–75.0)
PLATELETS: 230 10*3/uL (ref 140–400)
RBC: 3.92 10*6/uL — ABNORMAL LOW (ref 4.20–5.82)
RDW: 14.2 % (ref 11.0–14.6)
WBC: 7.3 10*3/uL (ref 4.0–10.3)

## 2017-04-16 ENCOUNTER — Ambulatory Visit (HOSPITAL_BASED_OUTPATIENT_CLINIC_OR_DEPARTMENT_OTHER): Payer: Medicare Other

## 2017-04-16 ENCOUNTER — Ambulatory Visit (HOSPITAL_BASED_OUTPATIENT_CLINIC_OR_DEPARTMENT_OTHER): Payer: Medicare Other | Admitting: Oncology

## 2017-04-16 ENCOUNTER — Telehealth: Payer: Self-pay

## 2017-04-16 ENCOUNTER — Encounter: Payer: Self-pay | Admitting: Radiation Oncology

## 2017-04-16 VITALS — BP 157/83 | HR 66 | Temp 98.0°F | Resp 20 | Ht 65.0 in | Wt 170.3 lb

## 2017-04-16 DIAGNOSIS — M4800 Spinal stenosis, site unspecified: Secondary | ICD-10-CM

## 2017-04-16 DIAGNOSIS — C7951 Secondary malignant neoplasm of bone: Secondary | ICD-10-CM

## 2017-04-16 DIAGNOSIS — R300 Dysuria: Secondary | ICD-10-CM | POA: Diagnosis not present

## 2017-04-16 DIAGNOSIS — E291 Testicular hypofunction: Secondary | ICD-10-CM | POA: Diagnosis not present

## 2017-04-16 DIAGNOSIS — C61 Malignant neoplasm of prostate: Secondary | ICD-10-CM

## 2017-04-16 LAB — PSA: Prostate Specific Ag, Serum: 36.1 ng/mL — ABNORMAL HIGH (ref 0.0–4.0)

## 2017-04-16 LAB — TESTOSTERONE: Testosterone, Serum: <3 ng/dL — ABNORMAL LOW (ref 264–916)

## 2017-04-16 MED ORDER — DENOSUMAB 120 MG/1.7ML ~~LOC~~ SOLN
120.0000 mg | Freq: Once | SUBCUTANEOUS | Status: AC
  Administered 2017-04-16: 120 mg via SUBCUTANEOUS
  Filled 2017-04-16: qty 1.7

## 2017-04-16 NOTE — Telephone Encounter (Signed)
Printed avs and calender with upcoming appointment per 10/25 los.

## 2017-04-16 NOTE — Patient Instructions (Signed)
Denosumab injection  What is this medicine?  DENOSUMAB (den oh sue mab) slows bone breakdown. Prolia is used to treat osteoporosis in women after menopause and in men. Xgeva is used to prevent bone fractures and other bone problems caused by cancer bone metastases. Xgeva is also used to treat giant cell tumor of the bone.  This medicine may be used for other purposes; ask your health care provider or pharmacist if you have questions.  What should I tell my health care provider before I take this medicine?  They need to know if you have any of these conditions:  -dental disease  -eczema  -infection or history of infections  -kidney disease or on dialysis  -low blood calcium or vitamin D  -malabsorption syndrome  -scheduled to have surgery or tooth extraction  -taking medicine that contains denosumab  -thyroid or parathyroid disease  -an unusual reaction to denosumab, other medicines, foods, dyes, or preservatives  -pregnant or trying to get pregnant  -breast-feeding  How should I use this medicine?  This medicine is for injection under the skin. It is given by a health care professional in a hospital or clinic setting.  If you are getting Prolia, a special MedGuide will be given to you by the pharmacist with each prescription and refill. Be sure to read this information carefully each time.  For Prolia, talk to your pediatrician regarding the use of this medicine in children. Special care may be needed. For Xgeva, talk to your pediatrician regarding the use of this medicine in children. While this drug may be prescribed for children as young as 13 years for selected conditions, precautions do apply.  Overdosage: If you think you have taken too much of this medicine contact a poison control center or emergency room at once.  NOTE: This medicine is only for you. Do not share this medicine with others.  What if I miss a dose?  It is important not to miss your dose. Call your doctor or health care professional if you are  unable to keep an appointment.  What may interact with this medicine?  Do not take this medicine with any of the following medications:  -other medicines containing denosumab  This medicine may also interact with the following medications:  -medicines that suppress the immune system  -medicines that treat cancer  -steroid medicines like prednisone or cortisone  This list may not describe all possible interactions. Give your health care provider a list of all the medicines, herbs, non-prescription drugs, or dietary supplements you use. Also tell them if you smoke, drink alcohol, or use illegal drugs. Some items may interact with your medicine.  What should I watch for while using this medicine?  Visit your doctor or health care professional for regular checks on your progress. Your doctor or health care professional may order blood tests and other tests to see how you are doing.  Call your doctor or health care professional if you get a cold or other infection while receiving this medicine. Do not treat yourself. This medicine may decrease your body's ability to fight infection.  You should make sure you get enough calcium and vitamin D while you are taking this medicine, unless your doctor tells you not to. Discuss the foods you eat and the vitamins you take with your health care professional.  See your dentist regularly. Brush and floss your teeth as directed. Before you have any dental work done, tell your dentist you are receiving this medicine.  Do   not become pregnant while taking this medicine or for 5 months after stopping it. Women should inform their doctor if they wish to become pregnant or think they might be pregnant. There is a potential for serious side effects to an unborn child. Talk to your health care professional or pharmacist for more information.  What side effects may I notice from receiving this medicine?  Side effects that you should report to your doctor or health care professional as soon as  possible:  -allergic reactions like skin rash, itching or hives, swelling of the face, lips, or tongue  -breathing problems  -chest pain  -fast, irregular heartbeat  -feeling faint or lightheaded, falls  -fever, chills, or any other sign of infection  -muscle spasms, tightening, or twitches  -numbness or tingling  -skin blisters or bumps, or is dry, peels, or red  -slow healing or unexplained pain in the mouth or jaw  -unusual bleeding or bruising  Side effects that usually do not require medical attention (Report these to your doctor or health care professional if they continue or are bothersome.):  -muscle pain  -stomach upset, gas  This list may not describe all possible side effects. Call your doctor for medical advice about side effects. You may report side effects to FDA at 1-800-FDA-1088.  Where should I keep my medicine?  This medicine is only given in a clinic, doctor's office, or other health care setting and will not be stored at home.  NOTE: This sheet is a summary. It may not cover all possible information. If you have questions about this medicine, talk to your doctor, pharmacist, or health care provider.      2016, Elsevier/Gold Standard. (2011-12-08 12:37:47)

## 2017-04-16 NOTE — Progress Notes (Signed)
Hematology and Oncology Follow Up Visit  [name] Mathews [id number] 11-18-1945 71 y.o. 04/16/2017 1:33 PM    Principle Diagnosis: 71 year old gentleman with advanced prostate cancer. He has metastatic disease to the bone. He was inially diagnosed  2001, gleason score 4+5= 9.  Prior Therapy: 1. 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 He was started on hormone therapy, initially with Lupron.  2. Casodex was added to firmagon in 2009 because of PSA rise.  3. 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  Current therapy:  Zytiga 1000 mg po daily started in 09/2011 with prednisone 5 mg daily. Xgeva 120 mg started on 10/31/2011. This is given monthly.  Lupron 30 mg subcutaneously to be resumed on 06/14/2015 and every 4 months.   Interim History: [name] Mathews presents today for a follow up visit. Since the last visit, he reports no changes in his health. He continues to take as Zytiga without any complications. He has not reported any GI complications such as nausea or abdominal pain. Has not reported any change in his bowel habits. His performance status and quality of life remains stable without any changes. He denied any flank pain. His appetite remains excellent and have gained some weight.   He does report difficulty emptying when urinating but no hematuria, incontinence.  He does report some nocturia.  He denies any pelvic pain or discomfort.  He denies any exacerbation of his back pain.   He does not report any headaches, blurry vision, syncope or seizures. He does not report any fevers or chills or sweats.  He has no shortness of breath, chest pain, cough, swelling, or palpitations. He denies any nausea, vomiting or abdominal pain. He does not report any frequency urgency or  hesitancy. The remainder of his detailed review of systems is otherwise unremarkable  Medications: I have reviewed the patient's current medications. Current Outpatient Prescriptions  Medication Sig Dispense Refill  . abiraterone Acetate (ZYTIGA) 250 MG tablet Take 4 tablets (1,000 mg total) by mouth daily. Take on an empty stomach 1 hour before or 2 hours after a meal 120 tablet 0  . aspirin EC 81 MG tablet Take 1 tablet (81 mg total) by mouth daily. 90 tablet 3  . atorvastatin (LIPITOR) 20 MG tablet Take 1 tablet (20 mg total) by mouth daily. 90 tablet 3  . denosumab (XGEVA) 120 MG/1.7ML SOLN Inject 120 mg into the skin every 30 (thirty) days.    . DULoxetine (CYMBALTA) 60 MG capsule Take 1 capsule (60 mg total) by mouth daily. 90 capsule 2  . EPIPEN 2-PAK 0.3 MG/0.3ML SOAJ injection Reported on 01/04/2016    . hydrochlorothiazide (HYDRODIURIL) 25 MG tablet Take 1 tablet (25 mg total) by mouth daily. 90 tablet 3  . HYDROmorphone (DILAUDID) 2 MG tablet Take 1 tablet (2 mg total) by mouth every 4 (four) hours as needed for severe pain. 60 tablet 0  . leuprolide (LUPRON) 30 MG injection Inject 30 mg into the muscle every 4 (four) months.    . predniSONE (DELTASONE) 5 MG tablet TAKE 1 TABLET (5 MG TOTAL) BY MOUTH 2 (TWO) TIMES DAILY. 60 tablet 2  . rOPINIRole (REQUIP) 2 MG tablet Take 2 mg by mouth 2 (two) times daily.     No current facility-administered medications for this visit.  Allergies:  Allergies  Allergen Reactions  . Bee Venom Anaphylaxis, Shortness Of Breath and Swelling    Tongue swelling.   . Wasp Venom Anaphylaxis, Shortness Of Breath and Swelling    Tongue swelling   . Percocet [Oxycodone-Acetaminophen] Palpitations    His past medical history was reviewed today and is unchanged.    Physical Exam: Blood pressure (!) 157/83, pulse 66, temperature 98 F (36.7 C), temperature source Oral, resp. rate 20, height 5\' 5"  (1.651 m), weight 170 lb 4.8 oz (77.2 kg), SpO2  99 %. ECOG: 0 General appearance: Alert, awake gentleman without distress. Head: Normocephalic, without obvious abnormality no oral thrush or ulcers. Neck: no adenopathy or neck masses. Lymph nodes: Cervical, supraclavicular, and axillary nodes normal. Heart:regular rate and rhythm, S1, S2 normal, no murmur, click, rub or gallop Lung:chest clear, no wheezing, rales, normal symmetric air entry but most of percussion. Abdomen: soft, non-tender, without masses or organomegaly no shifting dullness or ascites. EXT:no erythema, induration, or nodules Neurological examination: No motor or sensory deficits.   Lab Results: Lab Results  Component Value Date   WBC 7.3 04/15/2017   HGB 10.4 (L) 04/15/2017   HCT 33.3 (L) 04/15/2017   MCV 84.9 04/15/2017   PLT 230 04/15/2017     Chemistry      Component Value Date/Time   NA 143 04/15/2017 1223   K 3.1 (L) 04/15/2017 1223   CL 99 01/07/2017 1225   CL 105 12/08/2012 1504   CO2 29 04/15/2017 1223   BUN 16.1 04/15/2017 1223   CREATININE 0.8 04/15/2017 1223      Component Value Date/Time   CALCIUM 8.7 04/15/2017 1223   ALKPHOS 67 04/15/2017 1223   AST 16 04/15/2017 1223   ALT 6 04/15/2017 1223   BILITOT 0.52 04/15/2017 1223      Results for [name] Mathews (MRN [id number]) as of 04/16/2017 12:53  Ref. Range 01/15/2017 12:28 03/03/2017 10:08 04/15/2017 12:23  Prostate Specific Ag, Serum Latest Ref Range: 0.0 - 4.0 ng/mL 45.1 (H) 40.7 (H) 36.1 (H)   IMPRESSION: 1. Enlarging hyperenhancing prostatic mass extending into the bladder lumen and likely invading the seminal vesicles, worrisome for progressive of primary prostate cancer. 2. Progressive retroperitoneal and left pelvic lymphadenopathy consistent with progressive metastatic disease. 3. Multifocal blastic osseous metastases are grossly stable. 4.  Aortic Atherosclerosis (ICD10-I70.0).     Impression and Plan:   71 year old gentleman with the following issues:    1.  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 He has an elevated PSA up to 33 in May of 2013.   He is currently on Zytiga and have tolerated it well since 2013. He continues to tolerate this medication without any complications.   His PSA remains relatively stable with recent decline slightly to 36.  CT scan and bone scan obtained on April 07, 2017 were personally reviewed and discussed with the patient today.  He appears to have rather stable systemic disease although his local disease is enlarging.  He has a pelvic mass arising from the prostate which have increased since prior examination.  He has not had definitive therapy for his prostate at the time of diagnosis.  Given these findings, I prefer to continue with the current systemic therapy without any changes.  I will refer him to radiation oncology for an evaluation for possible local treatment of his pelvic mass.  I fear that systemic therapy would be difficult to control this disease locally.  To prevent from recurrent symptoms  and possible urinary obstruction, possible palliative radiation therapy may be helpful.   2. Bone directed therapy. On Xgeva monthly with calcium supplementation. Risks and benefits of continuing Xgeva reviewed and is agreeable to continue.  3. Androgen deprivation: This was repeated on 03/03/2017.  And will be given again in January 2018.  4. Lower back pain/ hip pain: MRI showed some spinal stenosis and his pain improved.  5. Followup: 4 weeks to follow-up.    Zola Button, MD 10/25/20181:33 PM

## 2017-04-27 ENCOUNTER — Encounter: Payer: Self-pay | Admitting: Radiation Oncology

## 2017-04-27 ENCOUNTER — Ambulatory Visit
Admission: RE | Admit: 2017-04-27 | Discharge: 2017-04-27 | Disposition: A | Discharge status: Home / Self Care | Payer: Medicare Other | Source: Ambulatory Visit | Attending: Radiation Oncology | Admitting: Radiation Oncology

## 2017-04-27 VITALS — BP 149/74 | HR 61 | Temp 98.3°F | Resp 16 | Wt 168.6 lb

## 2017-04-27 DIAGNOSIS — E785 Hyperlipidemia, unspecified: Secondary | ICD-10-CM | POA: Diagnosis not present

## 2017-04-27 DIAGNOSIS — Z79899 Other long term (current) drug therapy: Secondary | ICD-10-CM | POA: Diagnosis not present

## 2017-04-27 DIAGNOSIS — C7951 Secondary malignant neoplasm of bone: Secondary | ICD-10-CM | POA: Diagnosis not present

## 2017-04-27 DIAGNOSIS — I10 Essential (primary) hypertension: Secondary | ICD-10-CM | POA: Insufficient documentation

## 2017-04-27 DIAGNOSIS — C61 Malignant neoplasm of prostate: Secondary | ICD-10-CM | POA: Insufficient documentation

## 2017-04-27 DIAGNOSIS — Z510 Encounter for antineoplastic radiation therapy: Secondary | ICD-10-CM | POA: Insufficient documentation

## 2017-04-27 DIAGNOSIS — I700 Atherosclerosis of aorta: Secondary | ICD-10-CM | POA: Diagnosis not present

## 2017-04-27 DIAGNOSIS — Z8049 Family history of malignant neoplasm of other genital organs: Secondary | ICD-10-CM | POA: Diagnosis not present

## 2017-04-27 DIAGNOSIS — C778 Secondary and unspecified malignant neoplasm of lymph nodes of multiple regions: Secondary | ICD-10-CM | POA: Insufficient documentation

## 2017-04-27 DIAGNOSIS — Z7982 Long term (current) use of aspirin: Secondary | ICD-10-CM | POA: Diagnosis not present

## 2017-04-27 DIAGNOSIS — G2581 Restless legs syndrome: Secondary | ICD-10-CM | POA: Insufficient documentation

## 2017-04-27 NOTE — Progress Notes (Signed)
GU Location of Tumor / Histology: advanced prostate cancer to bone requiring local treatment of a pelvic mass.   If Prostate Cancer, Gleason Score is (4 + 5).    [name] Mathews underwent an attempted prostatectomy and found to have a left pelvic enlargement that was biopsy proven to be metastatic adenocarcinoma involving 2-3 left pelvic lymph nodes.    Past/Anticipated interventions by urology, if any: attempted prostatectomy  Past/Anticipated interventions by medical oncology, if any:  Prior Therapy: 1. 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 He was started on hormone therapy, initially with Lupron.  2. Casodex was added to firmagon in 2009 because of PSA rise.  3. 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  Current therapy:  Zytiga 1000 mg po daily started in 09/2011 with prednisone 5 mg daily. Xgeva 120 mg started on 10/31/2011. This is given monthly.  Lupron 30 mg subcutaneously to be resumed on 06/14/2015 and every 4 months.     Weight changes, if any: appetite excellent and has gained weight.  Bowel/Bladder complaints, if any: reports difficulty emptying his bladder. Denies hematuria or incontinence. Reports nocturia every hour. Denies urinary frequency, urgency or hesitancy.  Nausea/Vomiting, if any: denies nausea or abdominal pain.  Pain issues, if any:  Denies pelvic, abdominal or flank pain. Denies exacerbation of back pain.   SAFETY ISSUES:  Prior radiation? no  Pacemaker/ICD? nono  Possible current pregnancy? no  Is the patient on methotrexate? no  Current Complaints / other details:  71 year old male. Widowed. Androgen deprivation: This was repeated on 03/03/2017.  And will be given again in January 2018. Has a Careers adviser,  [name] Mathews.

## 2017-04-27 NOTE — Progress Notes (Signed)
Radiation Oncology         [phone]) [phone] ________________________________  Initial Outpatient Consultation  Name: [name] Mathews MRN: [id number]  Date: 04/27/2017  DOB: 01/23/1946  QI:[id number], Charlesetta Ivory, MD  [name], MD   REFERRING PHYSICIAN: [name], MD  DIAGNOSIS: 71 y.o. 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    ICD-10-CM   1. Malignant neoplasm of prostate ([place]) C61     HISTORY OF PRESENT ILLNESS: [name] Mathews is a 71 y.o. male with a diagnosis of advanced prostate cancer and metastatic disease to the bone. He was initially diagnosed in 2001 with Gleason score 4+5=9. He underwent a prostatectomy that year and was found to have a left pelvic enlargement that was biopsied and proven to be metastatic adenocarcinoma involving 2-3 left pelvic lymph nodes. He was started on hormone therapy, initially with Lupron. Casodex was added to firmagon in 2009 due to PSA rise. He developed castration resistant disease in March 2013 when his PSA went to 33, and imaging detected bony metastasis. He has been on  Severance, Delton See, and Lupron under Dr. Alen Blew since.  He had CT abd/pelvis and bone scan done on 04/07/2017 was performed. CT showed enlarging prostatic mass extending into the bladder lumen and progressive retroperitoneal and left pelvic lymphadenopathy with the mass measuring 4.8 x 3.4 x 6.4 cm. Bone scan showed fluctuating activity within known multifocal osseous metastases in the thoracolumbar spine and slightly worsening activity in the right iliac bone. Given these findings, Dr. Alen Blew plans to continue with current systemic therapy without any change, but requested he come today to discuss the options of radiotherapy for local control. His last Lupron injection was on 03/03/2017 and will be given again in January 2019.     PREVIOUS RADIATION THERAPY: No  PAST MEDICAL HISTORY:  Past Medical History:    Diagnosis Date  . Hyperlipidemia   . Hypertension   . Prostate cancer ([place])   . Restless leg syndrome       PAST SURGICAL HISTORY: Past Surgical History:  Procedure Laterality Date  . prostectomy  2001    FAMILY HISTORY:  Family History  Problem Relation Age of Onset  . Heart attack Mother 55       died of heart attack  . Cervical cancer Sister 70  . Stroke Brother 12    SOCIAL HISTORY:  Social History   Socioeconomic History  . Marital status: Widowed    Spouse name: Not on file  . Number of children: Not on file  . Years of education: Not on file  . Highest education level: Not on file  Social Needs  . Financial resource strain: Not on file  . Food insecurity - worry: Not on file  . Food insecurity - inability: Not on file  . Transportation needs - medical: Not on file  . Transportation needs - non-medical: Not on file  Occupational History  . Occupation: retired Chemical engineer: [employer]    Comment: retired  Tobacco Use  . Smoking status: Former Smoker    Packs/day: 0.25    Years: 6.00    Pack years: 1.50    Start date: 1969    Last attempt to quit: 1975    Years since quitting: 43.8  . Smokeless tobacco: Never Used  Substance and Sexual Activity  . Alcohol use: No  . Drug use: No  . Sexual activity: No  Other Topics Concern  .  Not on file  Social History Narrative   Walks about 0.5 a day. Play a lot of golf. Widowed. Surrounded by his children and grandchildren. Has a Careers adviser, Felix Ahmadi.  The patient is very active and has three part time jobs.   ALLERGIES: Bee venom; Wasp venom; and Percocet [oxycodone-acetaminophen]  MEDICATIONS:  Current Outpatient Medications  Medication Sig Dispense Refill  . abiraterone Acetate (ZYTIGA) 250 MG tablet Take 4 tablets (1,000 mg total) by mouth daily. Take on an empty stomach 1 hour before or 2 hours after a meal 120 tablet 0  . aspirin EC 81 MG tablet Take 1 tablet (81 mg total) by mouth  daily. 90 tablet 3  . atorvastatin (LIPITOR) 20 MG tablet Take 1 tablet (20 mg total) by mouth daily. 90 tablet 3  . denosumab (XGEVA) 120 MG/1.7ML SOLN Inject 120 mg into the skin every 30 (thirty) days.    . DULoxetine (CYMBALTA) 60 MG capsule Take 1 capsule (60 mg total) by mouth daily. 90 capsule 2  . EPIPEN 2-PAK 0.3 MG/0.3ML SOAJ injection Reported on 01/04/2016    . hydrochlorothiazide (HYDRODIURIL) 25 MG tablet Take 1 tablet (25 mg total) by mouth daily. 90 tablet 3  . HYDROmorphone (DILAUDID) 2 MG tablet Take 1 tablet (2 mg total) by mouth every 4 (four) hours as needed for severe pain. 60 tablet 0  . leuprolide (LUPRON) 30 MG injection Inject 30 mg into the muscle every 4 (four) months.    . predniSONE (DELTASONE) 5 MG tablet TAKE 1 TABLET (5 MG TOTAL) BY MOUTH 2 (TWO) TIMES DAILY. 60 tablet 2  . rOPINIRole (REQUIP) 2 MG tablet Take 2 mg by mouth 2 (two) times daily.     No current facility-administered medications for this encounter.     REVIEW OF SYSTEMS:  On review of systems, the patient reports that he is doing well overall. He denies any chest pain, shortness of breath, cough, fevers, chills, or night sweats. He reports an excellent appetite and weight gain. He denies any bowel disturbances, and denies abdominal pain, nausea or vomiting. He denies any new musculoskeletal or joint aches or pains. He describes some intermittent difficulty in knowing whether he will urinate versus need to have a bowel movement. He describes additional urinary symptoms, including difficulty emptying his bladder and nocturia every hour. He denies any dysuria, hematuria, incontinence, frequency, urgency, or hesitancy. He states he has not taking any medication for his urinary symptoms. He is able to complete sexual activity with some attempts. A complete review of systems is obtained and is otherwise negative.    PHYSICAL EXAM:  Wt Readings from Last 3 Encounters:  04/27/17 168 lb 9.6 oz (76.5 kg)    04/16/17 170 lb 4.8 oz (77.2 kg)  03/03/17 170 lb 9.6 oz (77.4 kg)   Temp Readings from Last 3 Encounters:  04/27/17 98.3 F (36.8 C) (Oral)  04/16/17 98 F (36.7 C) (Oral)  03/03/17 97.8 F (36.6 C) (Oral)   BP Readings from Last 3 Encounters:  04/27/17 (!) 149/74  04/16/17 (!) 157/83  03/03/17 (!) 164/76   Pulse Readings from Last 3 Encounters:  04/27/17 61  04/16/17 66  03/03/17 (!) 56   Pain Assessment Pain Score: 0-No pain/10  In general this is a well appearing African-American man in no acute distress. He is alert and oriented x4 and appropriate throughout the examination. HEENT reveals that the patient is normocephalic, atraumatic. Skin is intact without any evidence of gross lesions. Cardiovascular exam reveals  a regular rate and rhythm, no clicks rubs or murmurs are auscultated. Chest is clear to auscultation bilaterally. Abdomen has active bowel sounds in all quadrants and is intact. The abdomen is soft, non tender, non distended.    KPS = 90  100 - Normal; no complaints; no evidence of disease. 90   - Able to carry on normal activity; minor signs or symptoms of disease. 80   - Normal activity with effort; some signs or symptoms of disease. 74   - Cares for self; unable to carry on normal activity or to do active work. 60   - Requires occasional assistance, but is able to care for most of his personal needs. 50   - Requires considerable assistance and frequent medical care. 57   - Disabled; requires special care and assistance. 59   - Severely disabled; hospital admission is indicated although death not imminent. 59   - Very sick; hospital admission necessary; active supportive treatment necessary. 10   - Moribund; fatal processes progressing rapidly. 0     - Dead  Karnofsky DA, Abelmann [place], Craver LS and Burchenal [facility] [phone]) The use of the nitrogen mustards in the palliative treatment of carcinoma: with particular reference to bronchogenic carcinoma Cancer 1  634-56  LABORATORY DATA:  Lab Results  Component Value Date   WBC 7.3 04/15/2017   HGB 10.4 (L) 04/15/2017   HCT 33.3 (L) 04/15/2017   MCV 84.9 04/15/2017   PLT 230 04/15/2017   Lab Results  Component Value Date   NA 143 04/15/2017   K 3.1 (L) 04/15/2017   CL 99 01/07/2017   CO2 29 04/15/2017   Lab Results  Component Value Date   ALT 6 04/15/2017   AST 16 04/15/2017   ALKPHOS 67 04/15/2017   BILITOT 0.52 04/15/2017     RADIOGRAPHY: Nm Bone Scan Whole Body  Result Date: 04/07/2017 CLINICAL DATA:  Follow up prostate cancer diagnosed originally in 2001 with recurrence in 2016. Oral chemotherapy ongoing. EXAM: NUCLEAR MEDICINE WHOLE BODY BONE SCAN TECHNIQUE: Whole body anterior and posterior images were obtained approximately 3 hours after intravenous injection of radiopharmaceutical. RADIOPHARMACEUTICALS:  21.5 MCi Technetium-75m MDP IV COMPARISON:  Whole-body bone scan 07/15/2016.  CT today. FINDINGS: Patient has known multifocal osseous metastatic disease. The degree of activity within the thoracolumbar spine is slightly decreased. Right iliac activity has slightly increased. There may be mildly increased activity within the right parieto-occipital region, although this difference could be positional. Prominent activity at the left sternoclavicular joint appears unchanged. The soft tissue activity is stable aside from less distention of the urinary bladder. IMPRESSION: Fluctuating activity within known multifocal osseous metastases. The activity in the thoracolumbar spine appears mildly improved, although there is slightly worsening activity in the right iliac bone and possibly in the right parieto-occipital region. Electronically Signed   By: Richardean Sale M.D.   On: 04/07/2017 14:45   Ct Abdomen Pelvis W Contrast  Result Date: 04/07/2017 CLINICAL DATA:  Follow up prostate cancer diagnosed originally in 2001 with recurrence in 2016. Oral chemotherapy ongoing. EXAM: CT ABDOMEN AND  PELVIS WITH CONTRAST TECHNIQUE: Multidetector CT imaging of the abdomen and pelvis was performed using the standard protocol following bolus administration of intravenous contrast. CONTRAST:  149mL ISOVUE-300 IOPAMIDOL (ISOVUE-300) INJECTION 61% COMPARISON:  Noncontrast CTs 11/13/2016. Thoracolumbar MRI 11/28/2016 and whole body bone scan today. FINDINGS: Lower chest: Stable mild scarring at the lung bases. No significant pleural or pericardial effusion. Gynecomastia again noted, incompletely visualized. Hepatobiliary: The liver  is normal in density without focal abnormality. No evidence of gallstones, gallbladder wall thickening or biliary dilatation. Pancreas: Unremarkable. No pancreatic ductal dilatation or surrounding inflammatory changes. Spleen: Normal in size without focal abnormality. Adrenals/Urinary Tract: Both adrenal glands appear normal. Both kidneys appear normal. No evidence of renal mass, hydronephrosis or urinary tract calculus. Moderate bladder wall thickening is present. There is a hyperenhancing mass contiguous with the prostate gland which extends into the inferior aspect of the bladder lumen. This measures 4.8 x 3.4 x 6.4 cm and is probably enlarged compared with the most recent study, although is much more obvious on this examination performed with contrast. Stomach/Bowel: No evidence of bowel wall thickening, distention or surrounding inflammatory change. The appendix appears normal. Mild diverticular changes of the sigmoid colon are grossly stable. Vascular/Lymphatic: There is mildly progressive retroperitoneal and left pelvic lymphadenopathy. For example, there is a posterior aortocaval node measuring 13 mm on image 33, an approximately 12 mm left common iliac node on image 45 and a 14 mm left external iliac node on image 55. Pelvic lymphadenectomy clips are present. There is diffuse aortic and branch vessel atherosclerosis. No acute vascular findings are seen. Reproductive: As above, there  is a large hyperenhancing prostatic mass which extends into the bladder lumen. There is suspicion of posterior extension of tumor into the seminal vesicles, especially on the right. Other: Probable post injection changes in the subcutaneous fat of the anterior abdominal wall. No ascites or peritoneal nodularity. Musculoskeletal: Multiple blastic metastases throughout the spine, ribs and bony pelvis are grossly stable. No lytic lesion or pathologic fracture identified. IMPRESSION: 1. Enlarging hyperenhancing prostatic mass extending into the bladder lumen and likely invading the seminal vesicles, worrisome for progressive of primary prostate cancer. 2. Progressive retroperitoneal and left pelvic lymphadenopathy consistent with progressive metastatic disease. 3. Multifocal blastic osseous metastases are grossly stable. 4.  Aortic Atherosclerosis (ICD10-I70.0). Electronically Signed   By: Richardean Sale M.D.   On: 04/07/2017 14:39      IMPRESSION/PLAN: 1. 71 y.o. 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 Dr. Tammi Klippel reviews the nature of metastatic prostate cancer, and in this case, reviews the findings on imaging which indicate local recurrence which is somewhat bulky. He has not had progression elsewhere, and for this reason, Dr. Alen Blew recommends continuing [place]. Dr. Tammi Klippel reviews his prior therapy and would agree with proceeding with radiotherapy for local control. He discusses the role of 4 weeks of radiotherapy to the prostatic fossa mass. We discussed the risks, benefits, short, and long term effects of therapy. The patient is interested in proceeding. He will return on Friday for simulation and consent.        Carola Rhine, [facility]   Page Me  Seen with  _____________________________________  Sheral Apley Tammi Klippel, M.D.  This document serves as a record of services personally performed by Tyler Pita, MD and Shona Simpson, PA-C. It was  created on their behalf by Rae Lips, a trained medical scribe. The creation of this record is based on the scribe's personal observations and the providers' statements to them. This document has been checked and approved by the attending providers.

## 2017-04-27 NOTE — Progress Notes (Signed)
See progress note under physician encounter. 

## 2017-05-01 ENCOUNTER — Ambulatory Visit
Admission: RE | Admit: 2017-05-01 | Discharge: 2017-05-01 | Disposition: A | Discharge status: Home / Self Care | Payer: Medicare Other | Source: Ambulatory Visit | Attending: Radiation Oncology | Admitting: Radiation Oncology

## 2017-05-01 DIAGNOSIS — C61 Malignant neoplasm of prostate: Secondary | ICD-10-CM

## 2017-05-01 NOTE — Progress Notes (Signed)
  Radiation Oncology         [phone]) [phone] ________________________________  Name: [name] Mathews MRN: [id number]  Date: 05/01/2017  DOB: 08-01-1945  SIMULATION AND TREATMENT PLANNING NOTE    ICD-10-CM   1. Recurrent prostate adenocarcinoma C61     DIAGNOSIS:  71 y.o. gentleman with recurrent metastatic castrate resistant adenocarcinoma of the prostatic fossa  NARRATIVE:  The patient was brought to the [facility].  Identity was confirmed.  All relevant records and images related to the planned course of therapy were reviewed.  The patient freely provided informed written consent to proceed with treatment after reviewing the details related to the planned course of therapy. The consent form was witnessed and verified by the simulation staff.  Then, the patient was set-up in a stable reproducible supine position for radiation therapy.  A vacuum lock pillow device was custom fabricated to position his legs in a reproducible immobilized position.  Then, I performed a urethrogram under sterile conditions to identify the prostatic apex.  CT images were obtained.  Surface markings were placed.  The CT images were loaded into the planning software.  Then the prostate target and avoidance structures including the rectum, bladder, bowel and hips were contoured.  Treatment planning then occurred.  The radiation prescription was entered and confirmed.  A total of one complex treatment devices was fabricated. I have requested : Intensity Modulated Radiotherapy (IMRT) is medically necessary for this case for the following reason:  Rectal sparing.[name]  PLAN:  The patient will receive 52.5 Gy in 20 fractions.  ________________________________  Sheral Apley Tammi Klippel, M.D.

## 2017-05-06 DIAGNOSIS — C61 Malignant neoplasm of prostate: Secondary | ICD-10-CM | POA: Diagnosis not present

## 2017-05-11 ENCOUNTER — Encounter: Payer: Self-pay | Admitting: Medical Oncology

## 2017-05-11 ENCOUNTER — Ambulatory Visit
Admission: RE | Admit: 2017-05-11 | Discharge: 2017-05-11 | Disposition: A | Discharge status: Home / Self Care | Payer: Medicare Other | Source: Ambulatory Visit | Attending: Radiation Oncology | Admitting: Radiation Oncology

## 2017-05-11 DIAGNOSIS — C61 Malignant neoplasm of prostate: Secondary | ICD-10-CM | POA: Diagnosis not present

## 2017-05-12 ENCOUNTER — Ambulatory Visit
Admission: RE | Admit: 2017-05-12 | Discharge: 2017-05-12 | Disposition: A | Discharge status: Home / Self Care | Payer: Medicare Other | Source: Ambulatory Visit | Attending: Radiation Oncology | Admitting: Radiation Oncology

## 2017-05-12 DIAGNOSIS — C61 Malignant neoplasm of prostate: Secondary | ICD-10-CM | POA: Diagnosis not present

## 2017-05-13 ENCOUNTER — Ambulatory Visit
Admission: RE | Admit: 2017-05-13 | Discharge: 2017-05-13 | Disposition: A | Discharge status: Home / Self Care | Payer: Medicare Other | Source: Ambulatory Visit | Attending: Radiation Oncology | Admitting: Radiation Oncology

## 2017-05-13 ENCOUNTER — Other Ambulatory Visit: Payer: Self-pay | Admitting: Student

## 2017-05-13 DIAGNOSIS — C61 Malignant neoplasm of prostate: Secondary | ICD-10-CM

## 2017-05-13 MED ORDER — ROPINIROLE HCL 2 MG PO TABS: 2.0000 mg | ORAL_TABLET | Freq: Every day | ORAL | 3 refills | Status: DC

## 2017-05-13 NOTE — Progress Notes (Signed)
Rx for Requip 2 mg sent to [facility] on [address]

## 2017-05-18 ENCOUNTER — Ambulatory Visit
Admission: RE | Admit: 2017-05-18 | Discharge: 2017-05-18 | Disposition: A | Discharge status: Home / Self Care | Payer: Medicare Other | Source: Ambulatory Visit | Attending: Radiation Oncology | Admitting: Radiation Oncology

## 2017-05-18 DIAGNOSIS — C61 Malignant neoplasm of prostate: Secondary | ICD-10-CM | POA: Diagnosis not present

## 2017-05-19 ENCOUNTER — Ambulatory Visit
Admission: RE | Admit: 2017-05-19 | Discharge: 2017-05-19 | Disposition: A | Discharge status: Home / Self Care | Payer: Medicare Other | Source: Ambulatory Visit | Attending: Radiation Oncology | Admitting: Radiation Oncology

## 2017-05-19 DIAGNOSIS — C61 Malignant neoplasm of prostate: Secondary | ICD-10-CM | POA: Diagnosis not present

## 2017-05-20 ENCOUNTER — Ambulatory Visit (HOSPITAL_BASED_OUTPATIENT_CLINIC_OR_DEPARTMENT_OTHER): Payer: Medicare Other | Admitting: Oncology

## 2017-05-20 ENCOUNTER — Telehealth: Payer: Self-pay | Admitting: Oncology

## 2017-05-20 ENCOUNTER — Ambulatory Visit
Admission: RE | Admit: 2017-05-20 | Discharge: 2017-05-20 | Disposition: A | Discharge status: Home / Self Care | Payer: Medicare Other | Source: Ambulatory Visit | Attending: Radiation Oncology | Admitting: Radiation Oncology

## 2017-05-20 ENCOUNTER — Other Ambulatory Visit (HOSPITAL_BASED_OUTPATIENT_CLINIC_OR_DEPARTMENT_OTHER): Payer: Medicare Other

## 2017-05-20 ENCOUNTER — Ambulatory Visit (HOSPITAL_BASED_OUTPATIENT_CLINIC_OR_DEPARTMENT_OTHER): Payer: Medicare Other

## 2017-05-20 DIAGNOSIS — C7951 Secondary malignant neoplasm of bone: Secondary | ICD-10-CM

## 2017-05-20 DIAGNOSIS — R5383 Other fatigue: Secondary | ICD-10-CM

## 2017-05-20 DIAGNOSIS — E291 Testicular hypofunction: Secondary | ICD-10-CM | POA: Diagnosis not present

## 2017-05-20 DIAGNOSIS — C61 Malignant neoplasm of prostate: Secondary | ICD-10-CM | POA: Diagnosis not present

## 2017-05-20 DIAGNOSIS — M545 Low back pain: Secondary | ICD-10-CM | POA: Diagnosis not present

## 2017-05-20 DIAGNOSIS — Z23 Encounter for immunization: Secondary | ICD-10-CM

## 2017-05-20 LAB — CBC WITH DIFFERENTIAL/PLATELET
BASO%: 0.4 % (ref 0.0–2.0)
BASOS ABS: 0 10*3/uL (ref 0.0–0.1)
EOS%: 2.1 % (ref 0.0–7.0)
Eosinophils Absolute: 0.1 10*3/uL (ref 0.0–0.5)
HCT: 33.7 % — ABNORMAL LOW (ref 38.4–49.9)
HEMOGLOBIN: 10.8 g/dL — AB (ref 13.0–17.1)
LYMPH%: 23.2 % (ref 14.0–49.0)
MCH: 26.2 pg — AB (ref 27.2–33.4)
MCHC: 32.2 g/dL (ref 32.0–36.0)
MCV: 81.6 fL (ref 79.3–98.0)
MONO#: 0.3 10*3/uL (ref 0.1–0.9)
MONO%: 8 % (ref 0.0–14.0)
NEUT#: 2.8 10*3/uL (ref 1.5–6.5)
NEUT%: 66.3 % (ref 39.0–75.0)
Platelets: 200 10*3/uL (ref 140–400)
RBC: 4.13 10*6/uL — AB (ref 4.20–5.82)
RDW: 14.5 % (ref 11.0–14.6)
WBC: 4.2 10*3/uL (ref 4.0–10.3)
lymph#: 1 10*3/uL (ref 0.9–3.3)

## 2017-05-20 LAB — COMPREHENSIVE METABOLIC PANEL
ALBUMIN: 3.2 g/dL — AB (ref 3.5–5.0)
ALK PHOS: 72 U/L (ref 40–150)
ALT: <6 U/L (ref 0–55)
AST: 15 U/L (ref 5–34)
Anion Gap: 8 mEq/L (ref 3–11)
BUN: 17.8 mg/dL (ref 7.0–26.0)
CO2: 31 meq/L — AB (ref 22–29)
Calcium: 9.4 mg/dL (ref 8.4–10.4)
Chloride: 102 mEq/L (ref 98–109)
Creatinine: 0.9 mg/dL (ref 0.7–1.3)
EGFR: >60 mL/min/{1.73_m2} (ref >60)
GLUCOSE: 120 mg/dL (ref 70–140)
POTASSIUM: 4.3 meq/L (ref 3.5–5.1)
SODIUM: 141 meq/L (ref 136–145)
Total Bilirubin: 0.25 mg/dL (ref 0.20–1.20)
Total Protein: 6.5 g/dL (ref 6.4–8.3)

## 2017-05-20 MED ORDER — ABIRATERONE ACETATE 250 MG PO TABS: 1000.0000 mg | ORAL_TABLET | Freq: Every day | ORAL | 0 refills | Status: DC

## 2017-05-20 MED ORDER — HYDROMORPHONE HCL 2 MG PO TABS: 2.0000 mg | ORAL_TABLET | ORAL | 0 refills | Status: DC | PRN

## 2017-05-20 MED ORDER — INFLUENZA VAC SPLIT QUAD 0.5 ML IM SUSY
0.5000 mL | PREFILLED_SYRINGE | Freq: Once | INTRAMUSCULAR | Status: AC
  Administered 2017-05-20: 0.5 mL via INTRAMUSCULAR
  Filled 2017-05-20: qty 0.5

## 2017-05-20 MED ORDER — DENOSUMAB 120 MG/1.7ML ~~LOC~~ SOLN
120.0000 mg | Freq: Once | SUBCUTANEOUS | Status: AC
  Administered 2017-05-20: 120 mg via SUBCUTANEOUS
  Filled 2017-05-20: qty 1.7

## 2017-05-20 NOTE — Progress Notes (Signed)
Hematology and Oncology Follow Up Visit  [name] Mathews [id number] [birth year] 71 y.o. 05/20/2017 3:10 PM    Principle Diagnosis: 71 year old gentleman with advanced prostate cancer. He has metastatic disease to the bone. He was inially diagnosed  2001, gleason score 4+5= 9.  Prior Therapy: 1. 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 He was started on hormone therapy, initially with Lupron.  2. Casodex was added to firmagon in 2009 because of PSA rise.  3. 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  Current therapy:  Zytiga 1000 mg po daily started in 09/2011 with prednisone 5 mg daily. Xgeva 120 mg started on 10/31/2011. This is given monthly.  Lupron 30 mg subcutaneously to be resumed on 06/14/2015 and every 4 months.  He is currently receiving pelvic radiation for his prostate.  Interim History: [name] Mathews presents today for a follow up visit. Since the last visit, he started radiation therapy and tolerated it well. He does report some mild fatigue and tiredness but for the most part no other complaints. He continues to have issues with intermittent back pain that is unrelated to his cancer. He is able to drive and attends to activities of daily living.  He continues to take as Zytiga without any complications. He has not reported any GI complications such as nausea or abdominal pain. Has not reported any change in his bowel habits. His performance status and quality of life remains stable without any changes. He denied any flank pain. His appetite remains excellent without changes in his weight.   He does not report any headaches, blurry vision, syncope or seizures. He does not report any fevers or chills or sweats.  He has no shortness of breath, chest pain, cough,  swelling, or palpitations. He denies any nausea, vomiting or abdominal pain. He does not report any frequency urgency or hesitancy. The remainder of his detailed review of systems is otherwise unremarkable  Medications: I have reviewed the patient's current medications. Current Outpatient Medications  Medication Sig Dispense Refill  . abiraterone acetate (ZYTIGA) 250 MG tablet Take 4 tablets (1,000 mg total) by mouth daily. Take on an empty stomach 1 hour before or 2 hours after a meal 120 tablet 0  . aspirin EC 81 MG tablet Take 1 tablet (81 mg total) by mouth daily. 90 tablet 3  . atorvastatin (LIPITOR) 20 MG tablet Take 1 tablet (20 mg total) by mouth daily. 90 tablet 3  . denosumab (XGEVA) 120 MG/1.7ML SOLN Inject 120 mg into the skin every 30 (thirty) days.    . DULoxetine (CYMBALTA) 60 MG capsule Take 1 capsule (60 mg total) by mouth daily. 90 capsule 2  . EPIPEN 2-PAK 0.3 MG/0.3ML SOAJ injection Reported on 01/04/2016    . hydrochlorothiazide (HYDRODIURIL) 25 MG tablet Take 1 tablet (25 mg total) by mouth daily. 90 tablet 3  . HYDROmorphone (DILAUDID) 2 MG tablet Take 1 tablet (2 mg total) by mouth every 4 (four) hours as needed for severe pain. 60 tablet 0  . leuprolide (LUPRON) 30 MG injection Inject 30 mg into the muscle every 4 (four) months.    . predniSONE (DELTASONE) 5 MG tablet TAKE 1 TABLET (5 MG TOTAL) BY MOUTH 2 (TWO) TIMES DAILY. 60 tablet 2  . rOPINIRole (REQUIP) 2 MG tablet Take 1 tablet (2  mg total) by mouth at bedtime. 90 tablet 3   No current facility-administered medications for this visit.    Facility-Administered Medications Ordered in Other Visits  Medication Dose Route Frequency Provider Last Rate Last Dose  . denosumab (XGEVA) injection 120 mg  120 mg Subcutaneous Once Maryanna Shape, NP      . Influenza vac split quadrivalent PF (FLUARIX) injection 0.5 mL  0.5 mL Intramuscular Once [name], MD         Allergies:  Allergies  Allergen Reactions  .  Bee Venom Anaphylaxis, Shortness Of Breath and Swelling    Tongue swelling.   . Wasp Venom Anaphylaxis, Shortness Of Breath and Swelling    Tongue swelling   . Percocet [Oxycodone-Acetaminophen] Palpitations    His past medical history was reviewed today and is unchanged.    Physical Exam: Blood pressure 135/74, pulse 65, temperature 98.7 F (37.1 C), temperature source Oral, resp. rate 18, height 5\' 5"  (1.651 m), weight 171 lb 8 oz (77.8 kg), SpO2 100 %. ECOG: 0 General appearance: Well-appearing gentleman without distress.. Head: Normocephalic, without obvious abnormality no oral ulcers or lesions. Neck: no adenopathy or neck masses. Lymph nodes: Cervical, supraclavicular, and axillary nodes normal. Heart:regular rate and rhythm, S1, S2 normal, no murmur, click, rub or gallop Lung:chest clear, no wheezing, rales, normal symmetric air entry but most of percussion. Abdomen: soft, non-tender, without masses or organomegaly no rebound or guarding. EXT:no erythema, induration, or nodules Neurological examination: No motor or sensory deficits.   Lab Results: Lab Results  Component Value Date   WBC 4.2 05/20/2017   HGB 10.8 (L) 05/20/2017   HCT 33.7 (L) 05/20/2017   MCV 81.6 05/20/2017   PLT 200 05/20/2017     Chemistry      Component Value Date/Time   NA 141 05/20/2017 1425   K 4.3 05/20/2017 1425   CL 99 01/07/2017 1225   CL 105 12/08/2012 1504   CO2 31 (H) 05/20/2017 1425   BUN 17.8 05/20/2017 1425   CREATININE 0.9 05/20/2017 1425      Component Value Date/Time   CALCIUM 9.4 05/20/2017 1425   ALKPHOS 72 05/20/2017 1425   AST 15 05/20/2017 1425   ALT <6 05/20/2017 1425   BILITOT 0.25 05/20/2017 1425      Results for [name] Mathews (MRN [id number]) as of 04/16/2017 12:53  Ref. Range 01/15/2017 12:28 03/03/2017 10:08 04/15/2017 12:23  Prostate Specific Ag, Serum Latest Ref Range: 0.0 - 4.0 ng/mL 45.1 (H) 40.7 (H) 36.1 (H)   IMPRESSION: 1. Enlarging hyperenhancing  prostatic mass extending into the bladder lumen and likely invading the seminal vesicles, worrisome for progressive of primary prostate cancer. 2. Progressive retroperitoneal and left pelvic lymphadenopathy consistent with progressive metastatic disease. 3. Multifocal blastic osseous metastases are grossly stable. 4.  Aortic Atherosclerosis (ICD10-I70.0).     Impression and Plan:   71 year old gentleman with the following issues:    1. 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 He has an elevated PSA up to 33 in May of 2013.   He is currently on Zytiga and have tolerated it well since 2013. He continues to tolerate this medication without any complications.   His PSA remains relatively stable with recent decline slightly to 36.  CT scan and bone scan obtained on April 07, 2017 showed a pelvic mass arising from the prostate which have increased since prior examination.   He is currently receiving radiation therapy to the pelvis to treat local  recurrence.  Plan is to continue with Zytiga at this time and is different salvage therapy if his disease progresses systemically.   2. Bone directed therapy. On Xgeva monthly with calcium supplementation. Risks and benefits of continuing Xgeva reviewed and is agreeable to continue.  3. Androgen deprivation: This was repeated on 03/03/2017.  And will be given again in January 2019  4. Lower back pain/ hip pain: MRI showed some spinal stenosis and currently using Dilaudid as needed.  5. Followup: 6 weeks to follow-up.    Zola Button, MD 11/28/20183:10 PM

## 2017-05-20 NOTE — Telephone Encounter (Signed)
Scheduled appt per 11/28 los- Gave patient AVS and calender per los.  

## 2017-05-20 NOTE — Patient Instructions (Signed)
Influenza Virus Vaccine (Flucelvax) What is this medicine? INFLUENZA VIRUS VACCINE (in floo EN zuh VAHY ruhs vak SEEN) helps to reduce the risk of getting influenza also known as the flu. The vaccine only helps protect you against some strains of the flu. This medicine may be used for other purposes; ask your health care provider or pharmacist if you have questions. COMMON BRAND NAME(S): FLUCELVAX What should I tell my health care provider before I take this medicine? They need to know if you have any of these conditions: -bleeding disorder like hemophilia -fever or infection -Guillain-Barre syndrome or other neurological problems -immune system problems -infection with the human immunodeficiency virus (HIV) or AIDS -low blood platelet counts -multiple sclerosis -an unusual or allergic reaction to influenza virus vaccine, other medicines, foods, dyes or preservatives -pregnant or trying to get pregnant -breast-feeding How should I use this medicine? This vaccine is for injection into a muscle. It is given by a health care professional. A copy of Vaccine Information Statements will be given before each vaccination. Read this sheet carefully each time. The sheet may change frequently. Talk to your pediatrician regarding the use of this medicine in children. Special care may be needed. Overdosage: If you think you've taken too much of this medicine contact a poison control center or emergency room at once. Overdosage: If you think you have taken too much of this medicine contact a poison control center or emergency room at once. NOTE: This medicine is only for you. Do not share this medicine with others. What if I miss a dose? This does not apply. What may interact with this medicine? -chemotherapy or radiation therapy -medicines that lower your immune system like etanercept, anakinra, infliximab, and adalimumab -medicines that treat or prevent blood clots like  warfarin -phenytoin -steroid medicines like prednisone or cortisone -theophylline -vaccines This list may not describe all possible interactions. Give your health care provider a list of all the medicines, herbs, non-prescription drugs, or dietary supplements you use. Also tell them if you smoke, drink alcohol, or use illegal drugs. Some items may interact with your medicine. What should I watch for while using this medicine? Report any side effects that do not go away within 3 days to your doctor or health care professional. Call your health care provider if any unusual symptoms occur within 6 weeks of receiving this vaccine. You may still catch the flu, but the illness is not usually as bad. You cannot get the flu from the vaccine. The vaccine will not protect against colds or other illnesses that may cause fever. The vaccine is needed every year. What side effects may I notice from receiving this medicine? Side effects that you should report to your doctor or health care professional as soon as possible: -allergic reactions like skin rash, itching or hives, swelling of the face, lips, or tongue Side effects that usually do not require medical attention (Report these to your doctor or health care professional if they continue or are bothersome.): -fever -headache -muscle aches and pains -pain, tenderness, redness, or swelling at the injection site -tiredness This list may not describe all possible side effects. Call your doctor for medical advice about side effects. You may report side effects to FDA at 1-800-FDA-1088. Where should I keep my medicine? The vaccine will be given by a health care professional in a clinic, pharmacy, doctor's office, or other health care setting. You will not be given vaccine doses to store at home. NOTE: This sheet is a summary.   It may not cover all possible information. If you have questions about this medicine, talk to your doctor, pharmacist, or health care  provider.  2018 Elsevier/Gold Standard (2011-05-21 14:06:47) Denosumab injection What is this medicine? DENOSUMAB (den oh sue mab) slows bone breakdown. Prolia is used to treat osteoporosis in women after menopause and in men. Delton See is used to treat a high calcium level due to cancer and to prevent bone fractures and other bone problems caused by multiple myeloma or cancer bone metastases. Delton See is also used to treat giant cell tumor of the bone. This medicine may be used for other purposes; ask your health care provider or pharmacist if you have questions. COMMON BRAND NAME(S): Prolia, XGEVA What should I tell my health care provider before I take this medicine? They need to know if you have any of these conditions: -dental disease -having surgery or tooth extraction -infection -kidney disease -low levels of calcium or Vitamin D in the blood -malnutrition -on hemodialysis -skin conditions or sensitivity -thyroid or parathyroid disease -an unusual reaction to denosumab, other medicines, foods, dyes, or preservatives -pregnant or trying to get pregnant -breast-feeding How should I use this medicine? This medicine is for injection under the skin. It is given by a health care professional in a hospital or clinic setting. If you are getting Prolia, a special MedGuide will be given to you by the pharmacist with each prescription and refill. Be sure to read this information carefully each time. For Prolia, talk to your pediatrician regarding the use of this medicine in children. Special care may be needed. For Delton See, talk to your pediatrician regarding the use of this medicine in children. While this drug may be prescribed for children as young as 13 years for selected conditions, precautions do apply. Overdosage: If you think you have taken too much of this medicine contact a poison control center or emergency room at once. NOTE: This medicine is only for you. Do not share this medicine with  others. What if I miss a dose? It is important not to miss your dose. Call your doctor or health care professional if you are unable to keep an appointment. What may interact with this medicine? Do not take this medicine with any of the following medications: -other medicines containing denosumab This medicine may also interact with the following medications: -medicines that lower your chance of fighting infection -steroid medicines like prednisone or cortisone This list may not describe all possible interactions. Give your health care provider a list of all the medicines, herbs, non-prescription drugs, or dietary supplements you use. Also tell them if you smoke, drink alcohol, or use illegal drugs. Some items may interact with your medicine. What should I watch for while using this medicine? Visit your doctor or health care professional for regular checks on your progress. Your doctor or health care professional may order blood tests and other tests to see how you are doing. Call your doctor or health care professional for advice if you get a fever, chills or sore throat, or other symptoms of a cold or flu. Do not treat yourself. This drug may decrease your body's ability to fight infection. Try to avoid being around people who are sick. You should make sure you get enough calcium and vitamin D while you are taking this medicine, unless your doctor tells you not to. Discuss the foods you eat and the vitamins you take with your health care professional. See your dentist regularly. Brush and floss your teeth as  directed. Before you have any dental work done, tell your dentist you are receiving this medicine. Do not become pregnant while taking this medicine or for 5 months after stopping it. Talk with your doctor or health care professional about your birth control options while taking this medicine. Women should inform their doctor if they wish to become pregnant or think they might be pregnant. There  is a potential for serious side effects to an unborn child. Talk to your health care professional or pharmacist for more information. What side effects may I notice from receiving this medicine? Side effects that you should report to your doctor or health care professional as soon as possible: -allergic reactions like skin rash, itching or hives, swelling of the face, lips, or tongue -bone pain -breathing problems -dizziness -jaw pain, especially after dental work -redness, blistering, peeling of the skin -signs and symptoms of infection like fever or chills; cough; sore throat; pain or trouble passing urine -signs of low calcium like fast heartbeat, muscle cramps or muscle pain; pain, tingling, numbness in the hands or feet; seizures -unusual bleeding or bruising -unusually weak or tired Side effects that usually do not require medical attention (report to your doctor or health care professional if they continue or are bothersome): -constipation -diarrhea -headache -joint pain -loss of appetite -muscle pain -runny nose -tiredness -upset stomach This list may not describe all possible side effects. Call your doctor for medical advice about side effects. You may report side effects to FDA at 1-800-FDA-1088. Where should I keep my medicine? This medicine is only given in a clinic, doctor's office, or other health care setting and will not be stored at home. NOTE: This sheet is a summary. It may not cover all possible information. If you have questions about this medicine, talk to your doctor, pharmacist, or health care provider.  2018 Elsevier/Gold Standard (2016-07-01 19:17:21)

## 2017-05-21 ENCOUNTER — Ambulatory Visit
Admission: RE | Admit: 2017-05-21 | Discharge: 2017-05-21 | Disposition: A | Discharge status: Home / Self Care | Payer: Medicare Other | Source: Ambulatory Visit | Attending: Radiation Oncology | Admitting: Radiation Oncology

## 2017-05-21 DIAGNOSIS — C61 Malignant neoplasm of prostate: Secondary | ICD-10-CM | POA: Diagnosis not present

## 2017-05-21 LAB — PSA: Prostate Specific Ag, Serum: 41.1 ng/mL — ABNORMAL HIGH (ref 0.0–4.0)

## 2017-05-22 ENCOUNTER — Ambulatory Visit
Admission: RE | Admit: 2017-05-22 | Discharge: 2017-05-22 | Disposition: A | Discharge status: Home / Self Care | Payer: Medicare Other | Source: Ambulatory Visit | Attending: Radiation Oncology | Admitting: Radiation Oncology

## 2017-05-22 DIAGNOSIS — C61 Malignant neoplasm of prostate: Secondary | ICD-10-CM | POA: Diagnosis not present

## 2017-05-25 ENCOUNTER — Ambulatory Visit
Admission: RE | Admit: 2017-05-25 | Discharge: 2017-05-25 | Disposition: A | Discharge status: Home / Self Care | Payer: Medicare Other | Source: Ambulatory Visit | Attending: Radiation Oncology | Admitting: Radiation Oncology

## 2017-05-25 DIAGNOSIS — C61 Malignant neoplasm of prostate: Secondary | ICD-10-CM | POA: Diagnosis not present

## 2017-05-26 ENCOUNTER — Ambulatory Visit
Admission: RE | Admit: 2017-05-26 | Discharge: 2017-05-26 | Disposition: A | Discharge status: Home / Self Care | Payer: Medicare Other | Source: Ambulatory Visit | Attending: Radiation Oncology | Admitting: Radiation Oncology

## 2017-05-26 DIAGNOSIS — C61 Malignant neoplasm of prostate: Secondary | ICD-10-CM | POA: Diagnosis not present

## 2017-05-27 ENCOUNTER — Ambulatory Visit
Admission: RE | Admit: 2017-05-27 | Discharge: 2017-05-27 | Disposition: A | Discharge status: Home / Self Care | Payer: Medicare Other | Source: Ambulatory Visit | Attending: Radiation Oncology | Admitting: Radiation Oncology

## 2017-05-27 DIAGNOSIS — C61 Malignant neoplasm of prostate: Secondary | ICD-10-CM | POA: Diagnosis not present

## 2017-05-28 ENCOUNTER — Ambulatory Visit
Admission: RE | Admit: 2017-05-28 | Discharge: 2017-05-28 | Disposition: A | Discharge status: Home / Self Care | Payer: Medicare Other | Source: Ambulatory Visit | Attending: Radiation Oncology | Admitting: Radiation Oncology

## 2017-05-28 DIAGNOSIS — C61 Malignant neoplasm of prostate: Secondary | ICD-10-CM | POA: Diagnosis not present

## 2017-05-29 ENCOUNTER — Ambulatory Visit
Admission: RE | Admit: 2017-05-29 | Discharge: 2017-05-29 | Disposition: A | Discharge status: Home / Self Care | Payer: Medicare Other | Source: Ambulatory Visit | Attending: Radiation Oncology | Admitting: Radiation Oncology

## 2017-05-29 DIAGNOSIS — C61 Malignant neoplasm of prostate: Secondary | ICD-10-CM | POA: Diagnosis not present

## 2017-06-01 ENCOUNTER — Ambulatory Visit: Payer: Medicare Other

## 2017-06-02 ENCOUNTER — Ambulatory Visit
Admission: RE | Admit: 2017-06-02 | Discharge: 2017-06-02 | Disposition: A | Discharge status: Home / Self Care | Payer: Medicare Other | Source: Ambulatory Visit | Attending: Radiation Oncology | Admitting: Radiation Oncology

## 2017-06-02 DIAGNOSIS — C61 Malignant neoplasm of prostate: Secondary | ICD-10-CM | POA: Diagnosis not present

## 2017-06-03 ENCOUNTER — Ambulatory Visit
Admission: RE | Admit: 2017-06-03 | Discharge: 2017-06-03 | Disposition: A | Discharge status: Home / Self Care | Payer: Medicare Other | Source: Ambulatory Visit | Attending: Radiation Oncology | Admitting: Radiation Oncology

## 2017-06-03 DIAGNOSIS — C61 Malignant neoplasm of prostate: Secondary | ICD-10-CM | POA: Diagnosis not present

## 2017-06-04 ENCOUNTER — Ambulatory Visit
Admission: RE | Admit: 2017-06-04 | Discharge: 2017-06-04 | Disposition: A | Discharge status: Home / Self Care | Payer: Medicare Other | Source: Ambulatory Visit | Attending: Radiation Oncology | Admitting: Radiation Oncology

## 2017-06-04 DIAGNOSIS — C61 Malignant neoplasm of prostate: Secondary | ICD-10-CM | POA: Diagnosis not present

## 2017-06-05 ENCOUNTER — Other Ambulatory Visit: Payer: Self-pay | Admitting: *Deleted

## 2017-06-05 ENCOUNTER — Ambulatory Visit
Admission: RE | Admit: 2017-06-05 | Discharge: 2017-06-05 | Disposition: A | Discharge status: Home / Self Care | Payer: Medicare Other | Source: Ambulatory Visit | Attending: Radiation Oncology | Admitting: Radiation Oncology

## 2017-06-05 DIAGNOSIS — C61 Malignant neoplasm of prostate: Secondary | ICD-10-CM | POA: Diagnosis not present

## 2017-06-05 MED ORDER — ABIRATERONE ACETATE 250 MG PO TABS: 1000.0000 mg | ORAL_TABLET | Freq: Every day | ORAL | 0 refills | Status: DC

## 2017-06-06 ENCOUNTER — Ambulatory Visit
Admission: RE | Admit: 2017-06-06 | Discharge: 2017-06-06 | Disposition: A | Discharge status: Home / Self Care | Payer: Medicare Other | Source: Ambulatory Visit | Attending: Radiation Oncology | Admitting: Radiation Oncology

## 2017-06-06 DIAGNOSIS — C61 Malignant neoplasm of prostate: Secondary | ICD-10-CM | POA: Diagnosis not present

## 2017-06-08 ENCOUNTER — Ambulatory Visit
Admission: RE | Admit: 2017-06-08 | Discharge: 2017-06-08 | Disposition: A | Discharge status: Home / Self Care | Payer: Medicare Other | Source: Ambulatory Visit | Attending: Radiation Oncology | Admitting: Radiation Oncology

## 2017-06-08 DIAGNOSIS — C61 Malignant neoplasm of prostate: Secondary | ICD-10-CM | POA: Diagnosis not present

## 2017-06-09 ENCOUNTER — Encounter: Payer: Self-pay | Admitting: Radiation Oncology

## 2017-06-09 ENCOUNTER — Ambulatory Visit
Admission: RE | Admit: 2017-06-09 | Discharge: 2017-06-09 | Disposition: A | Discharge status: Home / Self Care | Payer: Medicare Other | Source: Ambulatory Visit | Attending: Radiation Oncology | Admitting: Radiation Oncology

## 2017-06-09 ENCOUNTER — Ambulatory Visit: Payer: Medicare Other

## 2017-06-09 DIAGNOSIS — C61 Malignant neoplasm of prostate: Secondary | ICD-10-CM | POA: Diagnosis not present

## 2017-06-10 ENCOUNTER — Ambulatory Visit: Payer: Medicare Other

## 2017-06-12 NOTE — Progress Notes (Signed)
°  Radiation Oncology         [phone]) [phone] ________________________________  Name: [name] Mathews MRN: [id number]  Date: 06/09/2017  DOB: 1945-07-15  End of Treatment Note  Diagnosis:   71 y.o. 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     Indication for treatment:  Palliative        Radiation treatment dates:   05/11/17 - 06/09/17   Site/dose:   The prostate was treated to 52.5 Gy in 20 fractions of 2.63 Gy  Beams/energy:   The patient was treated with IMRT using volumetric arc therapy delivering 6 MV X-rays to clockwise and counterclockwise circumferential arcs with a 90 degree collimator offset to avoid dose scalloping.  Image guidance was performed with daily cone beam CT prior to each fraction to align to gold markers in the prostate and assure proper bladder and rectal fill volumes.  Immobilization was achieved with BodyFix custom mold.      Narrative: The patient tolerated radiation treatment relatively well.  He experienced some minor urinary irritation and modest fatigue.  He experienced nocturia 5 to 6 times per night, mild dysuria, weak stream, hesitancy and intermittency but denied having any hematuria or having fatigue.  He did not feel that he was able to empty his bladder completely and had increased frequency every15 to 30 minutes with small volume voids with straining and occasional leakage after urination. He reported soft BMs 3 times a day.  Plan: The patient has completed radiation treatment. The patient will return to radiation oncology clinic for routine followup in one month. I advised him to call or return sooner if he has any questions or concerns related to his recovery or treatment. ________________________________  Sheral Apley. Tammi Klippel, M.D.   This document serves as a record of services personally performed by Tyler Pita MD. It was created on his behalf by Delton Coombes, a trained medical scribe. The  creation of this record is based on the scribe's personal observations and the provider's statements to them.

## 2017-06-22 ENCOUNTER — Encounter (HOSPITAL_COMMUNITY): Payer: Self-pay | Admitting: Emergency Medicine

## 2017-06-22 ENCOUNTER — Emergency Department (HOSPITAL_COMMUNITY)
Admission: EM | Admit: 2017-06-22 | Discharge: 2017-06-22 | Disposition: A | Discharge status: Home / Self Care | Payer: Medicare Other | Attending: Emergency Medicine | Admitting: Emergency Medicine

## 2017-06-22 ENCOUNTER — Other Ambulatory Visit: Payer: Self-pay

## 2017-06-22 ENCOUNTER — Telehealth: Payer: Self-pay | Admitting: Radiation Oncology

## 2017-06-22 DIAGNOSIS — Z87891 Personal history of nicotine dependence: Secondary | ICD-10-CM | POA: Insufficient documentation

## 2017-06-22 DIAGNOSIS — Z91030 Bee allergy status: Secondary | ICD-10-CM | POA: Diagnosis not present

## 2017-06-22 DIAGNOSIS — Z7982 Long term (current) use of aspirin: Secondary | ICD-10-CM | POA: Diagnosis not present

## 2017-06-22 DIAGNOSIS — Z79899 Other long term (current) drug therapy: Secondary | ICD-10-CM | POA: Diagnosis not present

## 2017-06-22 DIAGNOSIS — R300 Dysuria: Secondary | ICD-10-CM

## 2017-06-22 DIAGNOSIS — Z8546 Personal history of malignant neoplasm of prostate: Secondary | ICD-10-CM | POA: Diagnosis not present

## 2017-06-22 DIAGNOSIS — I10 Essential (primary) hypertension: Secondary | ICD-10-CM | POA: Insufficient documentation

## 2017-06-22 DIAGNOSIS — N342 Other urethritis: Secondary | ICD-10-CM

## 2017-06-22 DIAGNOSIS — Z885 Allergy status to narcotic agent status: Secondary | ICD-10-CM | POA: Diagnosis not present

## 2017-06-22 DIAGNOSIS — N4889 Other specified disorders of penis: Secondary | ICD-10-CM | POA: Diagnosis present

## 2017-06-22 LAB — BASIC METABOLIC PANEL
ANION GAP: 8 (ref 5–15)
BUN: 22 mg/dL — AB (ref 6–20)
CHLORIDE: 103 mmol/L (ref 101–111)
CO2: 32 mmol/L (ref 22–32)
Calcium: 9 mg/dL (ref 8.9–10.3)
Creatinine, Ser: 0.83 mg/dL (ref 0.61–1.24)
GFR calc Af Amer: >60 mL/min (ref >60)
GLUCOSE: 92 mg/dL (ref 65–99)
POTASSIUM: 3 mmol/L — AB (ref 3.5–5.1)
Sodium: 143 mmol/L (ref 135–145)

## 2017-06-22 LAB — CBC WITH DIFFERENTIAL/PLATELET
BASOS ABS: 0 10*3/uL (ref 0.0–0.1)
Basophils Relative: 0 %
EOS PCT: 3 %
Eosinophils Absolute: 0.2 10*3/uL (ref 0.0–0.7)
HCT: 35.3 % — ABNORMAL LOW (ref 39.0–52.0)
HEMOGLOBIN: 11.2 g/dL — AB (ref 13.0–17.0)
LYMPHS ABS: 0.8 10*3/uL (ref 0.7–4.0)
LYMPHS PCT: 9 %
MCH: 26.2 pg (ref 26.0–34.0)
MCHC: 31.7 g/dL (ref 30.0–36.0)
MCV: 82.5 fL (ref 78.0–100.0)
Monocytes Absolute: 0.6 10*3/uL (ref 0.1–1.0)
Monocytes Relative: 7 %
NEUTROS ABS: 7.6 10*3/uL (ref 1.7–7.7)
NEUTROS PCT: 81 %
PLATELETS: 214 10*3/uL (ref 150–400)
RBC: 4.28 MIL/uL (ref 4.22–5.81)
RDW: 15.6 % — ABNORMAL HIGH (ref 11.5–15.5)
WBC: 9.2 10*3/uL (ref 4.0–10.5)

## 2017-06-22 LAB — URINALYSIS, ROUTINE W REFLEX MICROSCOPIC
BACTERIA UA: NONE SEEN
Bilirubin Urine: NEGATIVE
Glucose, UA: NEGATIVE mg/dL
Hgb urine dipstick: NEGATIVE
Ketones, ur: 5 mg/dL — AB
LEUKOCYTES UA: NEGATIVE
Nitrite: NEGATIVE
PROTEIN: 100 mg/dL — AB
SPECIFIC GRAVITY, URINE: 1.028 (ref 1.005–1.030)
SQUAMOUS EPITHELIAL / LPF: NONE SEEN
pH: 5 (ref 5.0–8.0)

## 2017-06-22 MED ORDER — POTASSIUM CHLORIDE ER 20 MEQ PO TBCR: EXTENDED_RELEASE_TABLET | ORAL | 0 refills | Status: DC

## 2017-06-22 MED ORDER — DOXYCYCLINE HYCLATE 100 MG PO CAPS: 100.0000 mg | ORAL_CAPSULE | Freq: Two times a day (BID) | ORAL | 0 refills | Status: DC

## 2017-06-22 MED ORDER — POTASSIUM CHLORIDE CRYS ER 20 MEQ PO TBCR
40.0000 meq | EXTENDED_RELEASE_TABLET | Freq: Once | ORAL | Status: AC
  Administered 2017-06-22: 40 meq via ORAL
  Filled 2017-06-22: qty 2

## 2017-06-22 MED ORDER — FLUCONAZOLE 150 MG PO TABS: 150.0000 mg | ORAL_TABLET | Freq: Every day | ORAL | 0 refills | Status: DC

## 2017-06-22 NOTE — ED Notes (Signed)
Bed: WA07 Expected date:  Expected time:  Means of arrival:  Comments: Hold ems

## 2017-06-22 NOTE — Telephone Encounter (Signed)
Received voicemail message from patient at 1000 requesting a return call. Phoned patient back. Patient states, "I wanted you and Dr. Tammi Klippel to know I am at the emergency room." Patient reports dysuria, hematuria, and penile pain. Patient reports these symptoms have been present for a week and a half. Patient completed radiation therapy on 06/03/17. Encouraged patient to remain in the ED for further work up. Assured patient this RN would notify Dr. Tammi Klippel of these findings. Encouraged patient to keep follow up appointment with Freeman Caldron, PA-C on 07/16/17.

## 2017-06-22 NOTE — Discharge Instructions (Signed)
See your Physician for recheck.  °

## 2017-06-22 NOTE — ED Triage Notes (Signed)
Pt completed radiation for prostate cancer on 12/12. Pt states he has penile pain and pain with urination that is worsening. Pt c/o hematuria x several days.

## 2017-06-22 NOTE — ED Notes (Signed)
Bed: [facility]1 Expected date:  Expected time:  Means of arrival:  Comments: 

## 2017-06-22 NOTE — ED Provider Notes (Signed)
[facility] DEPT Provider Note   CSN: [id number] Arrival date & time: 06/22/17  1221     History   Chief Complaint Chief Complaint  Patient presents with  . penile pain  . Dysuria    HPI [name] Mathews is a 71 y.o. male.  Pt reports he has had radiation for prostate cancer.  Pt reports he noticed some blood on the tip of his penis yesterday.  Pt reports stinging to distal penis with some blood.  Pt just finished radiation treatment.     The history is provided by the patient. No language interpreter was used.  Dysuria   This is a new problem. The problem has not changed since onset.The pain is moderate. There has been no fever. He is not sexually active. There is no history of pyelonephritis. Pertinent negatives include no chills. He has tried nothing for the symptoms. His past medical history does not include recurrent UTIs.    Past Medical History:  Diagnosis Date  . Hyperlipidemia   . Hypertension   . Prostate cancer ([place])   . Restless leg syndrome     Patient Active Problem List   Diagnosis Date Noted  . Neuropathic pain 02/09/2017  . Routine adult health maintenance 01/08/2017  . Essential hypertension 01/08/2017  . Hyperlipidemia 01/08/2017  . Cervical lymphadenopathy 01/08/2017  . Back pain 09/08/2014  . 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 prostectomy  2001       Home Medications    Prior to Admission medications   Medication Sig Start Date End Date Taking? Authorizing Provider  abiraterone acetate (ZYTIGA) 250 MG tablet Take 4 tablets (1,000 mg total) by mouth daily. Take on an empty stomach 1 hour before or 2 hours after a meal 06/05/17  Yes [name], [name], MD  aspirin EC 81 MG tablet Take 1 tablet (81 mg total) by mouth daily. 02/09/17  Yes Mercy Riding, MD  atorvastatin (LIPITOR) 20 MG tablet Take 1 tablet (20 mg total) by mouth daily. 02/09/17  Yes  Mercy Riding, MD  denosumab (XGEVA) 120 MG/1.7ML SOLN Inject 120 mg into the skin every 30 (thirty) days.   Yes [provider]  DULoxetine (CYMBALTA) 60 MG capsule Take 1 capsule (60 mg total) by mouth daily. 02/09/17  Yes Mercy Riding, MD  hydrochlorothiazide (HYDRODIURIL) 25 MG tablet Take 1 tablet (25 mg total) by mouth daily. 02/09/17  Yes Mercy Riding, MD  HYDROmorphone (DILAUDID) 2 MG tablet Take 1 tablet (2 mg total) by mouth every 4 (four) hours as needed for severe pain. 05/20/17  Yes [name], [name], MD  predniSONE (DELTASONE) 5 MG tablet TAKE 1 TABLET (5 MG TOTAL) BY MOUTH 2 (TWO) TIMES DAILY. 08/21/16  Yes [name], MD  rOPINIRole (REQUIP) 2 MG tablet Take 1 tablet (2 mg total) by mouth at bedtime. 05/13/17  Yes Mercy Riding, MD  doxycycline (VIBRAMYCIN) 100 MG capsule Take 1 capsule (100 mg total) by mouth 2 (two) times daily. 06/22/17   Fransico Meadow, PA-C  EPIPEN 2-PAK 0.3 MG/0.3ML SOAJ injection Reported on 01/04/2016 10/27/13   [provider]  fluconazole (DIFLUCAN) 150 MG tablet Take 1 tablet (150 mg total) by mouth daily. 06/22/17   Fransico Meadow, PA-C  leuprolide (LUPRON) 30 MG injection Inject 30 mg into the muscle every 4 (four) months.    [provider]  potassium chloride 20 MEQ TBCR  One tablet a day 06/22/17   Fransico Meadow, PA-C    Family History Family History  Problem Relation Age of Onset  . Heart attack Mother 79       died of heart attack  . Cervical cancer Sister 33  . Stroke Brother 79    Social History Social History   Tobacco Use  . Smoking status: Former Smoker    Packs/day: 0.25    Years: 6.00    Pack years: 1.50    Start date: 1969    Last attempt to quit: 1975    Years since quitting: 44.0  . Smokeless tobacco: Never Used  Substance Use Topics  . Alcohol use: No  . Drug use: No     Allergies   Bee venom; Wasp venom; and Percocet [oxycodone-acetaminophen]   Review of Systems Review of Systems    Constitutional: Negative for chills.  Genitourinary: Positive for dysuria.  All other systems reviewed and are negative.    Physical Exam Updated Vital Signs BP 134/75 (BP Location: Right Arm)   Pulse 68   Temp 98.8 F (37.1 C) (Oral)   Resp 18   Ht 5\' 5"  (1.651 m)   Wt 74.4 kg (164 lb)   SpO2 98%   BMI 27.29 kg/m   Physical Exam  Constitutional: He appears well-developed and well-nourished.  HENT:  Head: Normocephalic and atraumatic.  Eyes: Conjunctivae are normal.  Neck: Neck supple.  Cardiovascular: Normal rate and regular rhythm.  No murmur heard. Pulmonary/Chest: Effort normal and breath sounds normal. No respiratory distress.  Abdominal: Soft. There is no tenderness.  Genitourinary: Penis normal.  Genitourinary Comments: No bleeding, slight irritation  to meatus   Musculoskeletal: He exhibits no edema.  Neurological: He is alert.  Skin: Skin is warm and dry.  Psychiatric: He has a normal mood and affect.  Nursing note and vitals reviewed.    ED Treatments / Results  Labs (all labs ordered are listed, but only abnormal results are displayed) Labs Reviewed  URINALYSIS, ROUTINE W REFLEX MICROSCOPIC - Abnormal; Notable for the following components:      Result Value   APPearance HAZY (*)    Ketones, ur 5 (*)    Protein, ur 100 (*)    All other components within normal limits  CBC WITH DIFFERENTIAL/PLATELET - Abnormal; Notable for the following components:   Hemoglobin 11.2 (*)    HCT 35.3 (*)    RDW 15.6 (*)    All other components within normal limits  BASIC METABOLIC PANEL - Abnormal; Notable for the following components:   Potassium 3.0 (*)    BUN 22 (*)    All other components within normal limits    EKG  EKG Interpretation None       Radiology No results found.  Procedures Procedures (including critical care time)  Medications Ordered in ED Medications  potassium chloride SA (K-DUR,KLOR-CON) CR tablet 40 mEq (40 mEq Oral Given  06/22/17 1746)   An After Visit Summary was printed and given to the patient.  Initial Impression / Assessment and Plan / ED Course  I have reviewed the triage vital signs and the nursing notes.  Pertinent labs & imaging results that were available during my care of the patient were reviewed by me and considered in my medical decision making (see chart for details).    Pt s not sexually active.  I am going to treat with diflucan and doxycycline.   Pt is advised to see his MD  for recheck.   Final Clinical Impressions(s) / ED Diagnoses   Final diagnoses:  Urethritis  Dysuria    ED Discharge Orders        Ordered    fluconazole (DIFLUCAN) 150 MG tablet  Daily     06/22/17 1736    doxycycline (VIBRAMYCIN) 100 MG capsule  2 times daily     06/22/17 1736    potassium chloride 20 MEQ TBCR     06/22/17 1736       Fransico Meadow, Hershal Coria 06/22/17 1911    Charlesetta Shanks, MD 06/26/17 1737

## 2017-06-24 ENCOUNTER — Encounter: Payer: Self-pay | Admitting: *Deleted

## 2017-06-24 ENCOUNTER — Other Ambulatory Visit: Payer: Self-pay | Admitting: *Deleted

## 2017-06-24 DIAGNOSIS — C61 Malignant neoplasm of prostate: Secondary | ICD-10-CM

## 2017-06-24 MED ORDER — ABIRATERONE ACETATE 250 MG PO TABS: 1000.0000 mg | ORAL_TABLET | Freq: Every day | ORAL | 0 refills | Status: DC

## 2017-06-25 ENCOUNTER — Encounter: Payer: Self-pay | Admitting: *Deleted

## 2017-06-28 ENCOUNTER — Telehealth: Payer: Self-pay | Admitting: Oncology

## 2017-06-28 NOTE — Telephone Encounter (Signed)
S/W PT, GAVE APPT FOR 1/18 @ 1PM WITH [facility].

## 2017-07-08 ENCOUNTER — Ambulatory Visit: Payer: Medicare Other | Admitting: Oncology

## 2017-07-08 ENCOUNTER — Ambulatory Visit: Payer: Medicare Other

## 2017-07-08 ENCOUNTER — Other Ambulatory Visit: Payer: Medicare Other

## 2017-07-09 MED ORDER — DEXAMETHASONE SODIUM PHOSPHATE 10 MG/ML IJ SOLN
INTRAMUSCULAR | Status: AC
  Filled 2017-07-09: qty 1

## 2017-07-09 MED ORDER — PALONOSETRON HCL INJECTION 0.25 MG/5ML
INTRAVENOUS | Status: AC
  Filled 2017-07-09: qty 5

## 2017-07-10 ENCOUNTER — Other Ambulatory Visit: Payer: Self-pay

## 2017-07-10 ENCOUNTER — Encounter: Payer: Self-pay | Admitting: Oncology

## 2017-07-10 ENCOUNTER — Inpatient Hospital Stay (HOSPITAL_BASED_OUTPATIENT_CLINIC_OR_DEPARTMENT_OTHER): Payer: Medicare Other | Admitting: Oncology

## 2017-07-10 ENCOUNTER — Telehealth: Payer: Self-pay | Admitting: Oncology

## 2017-07-10 ENCOUNTER — Other Ambulatory Visit: Payer: Self-pay | Admitting: Oncology

## 2017-07-10 ENCOUNTER — Inpatient Hospital Stay: Payer: Medicare Other

## 2017-07-10 ENCOUNTER — Inpatient Hospital Stay: Payer: Medicare Other | Attending: Oncology

## 2017-07-10 VITALS — BP 125/77 | HR 77 | Temp 98.1°F | Resp 18 | Ht 65.0 in | Wt 162.1 lb

## 2017-07-10 DIAGNOSIS — C61 Malignant neoplasm of prostate: Secondary | ICD-10-CM

## 2017-07-10 DIAGNOSIS — M545 Low back pain: Secondary | ICD-10-CM | POA: Diagnosis not present

## 2017-07-10 DIAGNOSIS — E291 Testicular hypofunction: Secondary | ICD-10-CM | POA: Insufficient documentation

## 2017-07-10 DIAGNOSIS — C7951 Secondary malignant neoplasm of bone: Secondary | ICD-10-CM

## 2017-07-10 DIAGNOSIS — Z5111 Encounter for antineoplastic chemotherapy: Secondary | ICD-10-CM | POA: Diagnosis present

## 2017-07-10 LAB — COMPREHENSIVE METABOLIC PANEL
ALT: 9 U/L (ref 0–55)
AST: 16 U/L (ref 5–34)
Albumin: 3.3 g/dL — ABNORMAL LOW (ref 3.5–5.0)
Alkaline Phosphatase: 78 U/L (ref 40–150)
Anion gap: 8 (ref 3–11)
BUN: 21 mg/dL (ref 7–26)
CHLORIDE: 101 mmol/L (ref 98–109)
CO2: 34 mmol/L — ABNORMAL HIGH (ref 22–29)
CREATININE: 1 mg/dL (ref 0.70–1.30)
Calcium: 9.6 mg/dL (ref 8.4–10.4)
GFR calc Af Amer: >60 mL/min (ref >60)
Glucose, Bld: 108 mg/dL (ref 70–140)
Potassium: 3.7 mmol/L (ref 3.5–5.1)
Sodium: 143 mmol/L (ref 136–145)
Total Bilirubin: 0.4 mg/dL (ref 0.2–1.2)
Total Protein: 7.1 g/dL (ref 6.4–8.3)

## 2017-07-10 LAB — CBC WITH DIFFERENTIAL/PLATELET
Basophils Absolute: 0 10*3/uL (ref 0.0–0.1)
Basophils Relative: 0 %
EOS ABS: 0.1 10*3/uL (ref 0.0–0.5)
Eosinophils Relative: 1 %
HCT: 37 % — ABNORMAL LOW (ref 38.4–49.9)
Hemoglobin: 11.6 g/dL — ABNORMAL LOW (ref 13.0–17.1)
LYMPHS ABS: 0.9 10*3/uL (ref 0.9–3.3)
LYMPHS PCT: 14 %
MCH: 26.1 pg — AB (ref 27.2–33.4)
MCHC: 31.4 g/dL — AB (ref 32.0–36.0)
MCV: 83.1 fL (ref 79.3–98.0)
MONO ABS: 0.4 10*3/uL (ref 0.1–0.9)
MONOS PCT: 7 %
Neutro Abs: 4.9 10*3/uL (ref 1.5–6.5)
Neutrophils Relative %: 78 %
PLATELETS: 209 10*3/uL (ref 140–400)
RBC: 4.45 MIL/uL (ref 4.20–5.82)
RDW: 15.8 % — ABNORMAL HIGH (ref 11.0–15.6)
WBC: 6.3 10*3/uL (ref 4.0–10.3)

## 2017-07-10 MED ORDER — LEUPROLIDE ACETATE (4 MONTH) 30 MG IM KIT
30.0000 mg | PACK | Freq: Once | INTRAMUSCULAR | Status: AC
  Administered 2017-07-10: 30 mg via INTRAMUSCULAR
  Filled 2017-07-10: qty 30

## 2017-07-10 MED ORDER — DENOSUMAB 120 MG/1.7ML ~~LOC~~ SOLN
120.0000 mg | Freq: Once | SUBCUTANEOUS | Status: AC
  Administered 2017-07-10: 120 mg via SUBCUTANEOUS

## 2017-07-10 NOTE — Telephone Encounter (Signed)
Gave avs and calendar patient needed a friday

## 2017-07-10 NOTE — Patient Instructions (Signed)
Denosumab injection What is this medicine? DENOSUMAB (den oh sue mab) slows bone breakdown. Prolia is used to treat osteoporosis in women after menopause and in men. Delton See is used to treat a high calcium level due to cancer and to prevent bone fractures and other bone problems caused by multiple myeloma or cancer bone metastases. Delton See is also used to treat giant cell tumor of the bone. This medicine may be used for other purposes; ask your health care provider or pharmacist if you have questions. COMMON BRAND NAME(S): Prolia, XGEVA What should I tell my health care provider before I take this medicine? They need to know if you have any of these conditions: -dental disease -having surgery or tooth extraction -infection -kidney disease -low levels of calcium or Vitamin D in the blood -malnutrition -on hemodialysis -skin conditions or sensitivity -thyroid or parathyroid disease -an unusual reaction to denosumab, other medicines, foods, dyes, or preservatives -pregnant or trying to get pregnant -breast-feeding How should I use this medicine? This medicine is for injection under the skin. It is given by a health care professional in a hospital or clinic setting. If you are getting Prolia, a special MedGuide will be given to you by the pharmacist with each prescription and refill. Be sure to read this information carefully each time. For Prolia, talk to your pediatrician regarding the use of this medicine in children. Special care may be needed. For Delton See, talk to your pediatrician regarding the use of this medicine in children. While this drug may be prescribed for children as young as 13 years for selected conditions, precautions do apply. Overdosage: If you think you have taken too much of this medicine contact a poison control center or emergency room at once. NOTE: This medicine is only for you. Do not share this medicine with others. What if I miss a dose? It is important not to miss your  dose. Call your doctor or health care professional if you are unable to keep an appointment. What may interact with this medicine? Do not take this medicine with any of the following medications: -other medicines containing denosumab This medicine may also interact with the following medications: -medicines that lower your chance of fighting infection -steroid medicines like prednisone or cortisone This list may not describe all possible interactions. Give your health care provider a list of all the medicines, herbs, non-prescription drugs, or dietary supplements you use. Also tell them if you smoke, drink alcohol, or use illegal drugs. Some items may interact with your medicine. What should I watch for while using this medicine? Visit your doctor or health care professional for regular checks on your progress. Your doctor or health care professional may order blood tests and other tests to see how you are doing. Call your doctor or health care professional for advice if you get a fever, chills or sore throat, or other symptoms of a cold or flu. Do not treat yourself. This drug may decrease your body's ability to fight infection. Try to avoid being around people who are sick. You should make sure you get enough calcium and vitamin D while you are taking this medicine, unless your doctor tells you not to. Discuss the foods you eat and the vitamins you take with your health care professional. See your dentist regularly. Brush and floss your teeth as directed. Before you have any dental work done, tell your dentist you are receiving this medicine. Do not become pregnant while taking this medicine or for 5 months after stopping  it. Talk with your doctor or health care professional about your birth control options while taking this medicine. Women should inform their doctor if they wish to become pregnant or think they might be pregnant. There is a potential for serious side effects to an unborn child. Talk  to your health care professional or pharmacist for more information. What side effects may I notice from receiving this medicine? Side effects that you should report to your doctor or health care professional as soon as possible: -allergic reactions like skin rash, itching or hives, swelling of the face, lips, or tongue -bone pain -breathing problems -dizziness -jaw pain, especially after dental work -redness, blistering, peeling of the skin -signs and symptoms of infection like fever or chills; cough; sore throat; pain or trouble passing urine -signs of low calcium like fast heartbeat, muscle cramps or muscle pain; pain, tingling, numbness in the hands or feet; seizures -unusual bleeding or bruising -unusually weak or tired Side effects that usually do not require medical attention (report to your doctor or health care professional if they continue or are bothersome): -constipation -diarrhea -headache -joint pain -loss of appetite -muscle pain -runny nose -tiredness -upset stomach This list may not describe all possible side effects. Call your doctor for medical advice about side effects. You may report side effects to FDA at 1-800-FDA-1088. Where should I keep my medicine? This medicine is only given in a clinic, doctor's office, or other health care setting and will not be stored at home. NOTE: This sheet is a summary. It may not cover all possible information. If you have questions about this medicine, talk to your doctor, pharmacist, or health care provider.  2018 Elsevier/Gold Standard (2016-07-01 19:17:21) Leuprolide depot injection What is this medicine? LEUPROLIDE (loo PROE lide) is a man-made protein that acts like a natural hormone in the body. It decreases testosterone in men and decreases estrogen in women. In men, this medicine is used to treat advanced prostate cancer. In women, some forms of this medicine may be used to treat endometriosis, uterine fibroids, or other  male hormone-related problems. This medicine may be used for other purposes; ask your health care provider or pharmacist if you have questions. COMMON BRAND NAME(S): Eligard, Lupron Depot, Lupron Depot-Ped, Viadur What should I tell my health care provider before I take this medicine? They need to know if you have any of these conditions: -diabetes -heart disease or previous heart attack -high blood pressure -high cholesterol -mental illness -osteoporosis -pain or difficulty passing urine -seizures -spinal cord metastasis -stroke -suicidal thoughts, plans, or attempt; a previous suicide attempt by you or a family member -tobacco smoker -unusual vaginal bleeding (women) -an unusual or allergic reaction to leuprolide, benzyl alcohol, other medicines, foods, dyes, or preservatives -pregnant or trying to get pregnant -breast-feeding How should I use this medicine? This medicine is for injection into a muscle or for injection under the skin. It is given by a health care professional in a hospital or clinic setting. The specific product will determine how it will be given to you. Make sure you understand which product you receive and how often you will receive it. Talk to your pediatrician regarding the use of this medicine in children. Special care may be needed. Overdosage: If you think you have taken too much of this medicine contact a poison control center or emergency room at once. NOTE: This medicine is only for you. Do not share this medicine with others. What if I miss a dose? It is  important not to miss a dose. Call your doctor or health care professional if you are unable to keep an appointment. Depot injections: Depot injections are given either once-monthly, every 12 weeks, every 16 weeks, or every 24 weeks depending on the product you are prescribed. The product you are prescribed will be based on if you are male or male, and your condition. Make sure you understand your  product and dosing. What may interact with this medicine? Do not take this medicine with any of the following medications: -chasteberry This medicine may also interact with the following medications: -herbal or dietary supplements, like black cohosh or DHEA -male hormones, like estrogens or progestins and birth control pills, patches, rings, or injections -male hormones, like testosterone This list may not describe all possible interactions. Give your health care provider a list of all the medicines, herbs, non-prescription drugs, or dietary supplements you use. Also tell them if you smoke, drink alcohol, or use illegal drugs. Some items may interact with your medicine. What should I watch for while using this medicine? Visit your doctor or health care professional for regular checks on your progress. During the first weeks of treatment, your symptoms may get worse, but then will improve as you continue your treatment. You may get hot flashes, increased bone pain, increased difficulty passing urine, or an aggravation of nerve symptoms. Discuss these effects with your doctor or health care professional, some of them may improve with continued use of this medicine. Male patients may experience a menstrual cycle or spotting during the first months of therapy with this medicine. If this continues, contact your doctor or health care professional. What side effects may I notice from receiving this medicine? Side effects that you should report to your doctor or health care professional as soon as possible: -allergic reactions like skin rash, itching or hives, swelling of the face, lips, or tongue -breathing problems -chest pain -depression or memory disorders -pain in your legs or groin -pain at site where injected or implanted -seizures -severe headache -swelling of the feet and legs -suicidal thoughts or other mood changes -visual changes -vomiting Side effects that usually do not require  medical attention (report to your doctor or health care professional if they continue or are bothersome): -breast swelling or tenderness -decrease in sex drive or performance -diarrhea -hot flashes -loss of appetite -muscle, joint, or bone pains -nausea -redness or irritation at site where injected or implanted -skin problems or acne This list may not describe all possible side effects. Call your doctor for medical advice about side effects. You may report side effects to FDA at 1-800-FDA-1088. Where should I keep my medicine? This drug is given in a hospital or clinic and will not be stored at home. NOTE: This sheet is a summary. It may not cover all possible information. If you have questions about this medicine, talk to your doctor, pharmacist, or health care provider.  2018 Elsevier/Gold Standard (2015-11-22 09:45:53)

## 2017-07-10 NOTE — Progress Notes (Signed)
[facility] OFFICE PROGRESS NOTE  Mercy Riding, MD [place] [id number]  DIAGNOSIS: 72 year old gentleman with advanced prostate cancer. He has metastatic disease to the bone. He was inially diagnosed  2001, gleason score 4+5= 9.  PRIOR THERAPY: 1. 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 He was started on hormone therapy, initially with Lupron.  2. Casodex was added to firmagon in 2009 because of PSA rise.  3. 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 He developed bony metastasis and PSA eventually went up to 62.  4. S/P pelvic radiation for an enlarging prostatic mass extending into the bladder lumen and progressive retroperitoneal and left pelvic lymphadenopathy.  Completed 06/09/2017.  CURRENT THERAPY: Zytiga 1000 mg po daily started in 09/2011 with prednisone 5 mg daily. Xgeva 120 mg started on 10/31/2011. This is given monthly.  Lupron 30 mg subcutaneously to be resumed on 06/14/2015 and every 4 months.   INTERVAL HISTORY: [name] Mathews 72 y.o. male returns for routine follow-up visit by himself.  The patient completed patient to the pelvis in December and is recovering well.  He was seen in the emergency room after the completion of radiation due to urethritis was placed on antibiotics with resolution of his symptoms.  He is starting to gain back some of his lost weight.  His appetite is good.  The patient denies fevers and chills.  Denies chest pain, shortness of breath, cough, hemoptysis.  Denies nausea, vomiting, constipation, diarrhea.  He does have some intermittent back pain unrelated to his cancer which is well controlled.  He continues take Zytiga without complications.  His performance status and quality of life remains stable without any changes. He does not report any headaches, blurry  vision, syncope or seizures. He does not report any frequency urgency or hesitancy. The remainder of his detailed review of systems is otherwise unremarkable.  The patient is here for blood work and to receive his Delton See and Lupron injections.  MEDICAL HISTORY: Past Medical History:  Diagnosis Date  . Hyperlipidemia   . Hypertension   . Prostate cancer ([place])   . Restless leg syndrome     ALLERGIES:  is allergic to bee venom; wasp venom; and percocet [oxycodone-acetaminophen].  MEDICATIONS:  Current Outpatient Medications  Medication Sig Dispense Refill  . abiraterone acetate (ZYTIGA) 250 MG tablet Take 4 tablets (1,000 mg total) by mouth daily. Take on an empty stomach 1 hour before or 2 hours after a meal 120 tablet 0  . aspirin EC 81 MG tablet Take 1 tablet (81 mg total) by mouth daily. 90 tablet 3  . atorvastatin (LIPITOR) 20 MG tablet Take 1 tablet (20 mg total) by mouth daily. 90 tablet 3  . denosumab (XGEVA) 120 MG/1.7ML SOLN Inject 120 mg into the skin every 30 (thirty) days.    . DULoxetine (CYMBALTA) 60 MG capsule Take 1 capsule (60 mg total) by mouth daily. 90 capsule 2  . hydrochlorothiazide (HYDRODIURIL) 25 MG tablet Take 1 tablet (25 mg total) by mouth daily. 90 tablet 3  . leuprolide (LUPRON) 30 MG injection Inject 30 mg into the muscle every 4 (four) months.    . potassium chloride 20 MEQ TBCR One tablet a day 30 tablet 0  . predniSONE (DELTASONE) 5 MG tablet TAKE 1 TABLET (5 MG TOTAL) BY MOUTH 2 (TWO) TIMES DAILY.  60 tablet 2  . rOPINIRole (REQUIP) 2 MG tablet Take 1 tablet (2 mg total) by mouth at bedtime. 90 tablet 3  . doxycycline (VIBRAMYCIN) 100 MG capsule Take 1 capsule (100 mg total) by mouth 2 (two) times daily. (Patient not taking: Reported on 07/10/2017) 20 capsule 0  . EPIPEN 2-PAK 0.3 MG/0.3ML SOAJ injection Reported on 01/04/2016    . fluconazole (DIFLUCAN) 150 MG tablet Take 1 tablet (150 mg total) by mouth daily. (Patient not taking: Reported on 07/10/2017) 1  tablet 0  . HYDROmorphone (DILAUDID) 2 MG tablet Take 1 tablet (2 mg total) by mouth every 4 (four) hours as needed for severe pain. (Patient not taking: Reported on 07/10/2017) 60 tablet 0   No current facility-administered medications for this visit.     SURGICAL HISTORY:  Past Surgical History:  Procedure Laterality Date  . prostectomy  2001    REVIEW OF SYSTEMS:   Review of Systems  Constitutional: Negative for appetite change, chills, fatigue, fever.  He is getting back lost weight.  HENT:   Negative for mouth sores, nosebleeds, sore throat and trouble swallowing.   Eyes: Negative for eye problems and icterus.  Respiratory: Negative for cough, hemoptysis, shortness of breath and wheezing.   Cardiovascular: Negative for chest pain and leg swelling.  Gastrointestinal: Negative for abdominal pain, constipation, diarrhea, nausea and vomiting.  Genitourinary: Negative for bladder incontinence, difficulty urinating, dysuria, frequency and hematuria.   Musculoskeletal: Negative for gait problem, neck pain and neck stiffness. Positive for intermittent back pain unrelated to his cancer diagnosis. Skin: Negative for itching and rash.  Neurological: Negative for dizziness, extremity weakness, gait problem, headaches, light-headedness and seizures.  Hematological: Negative for adenopathy. Does not bruise/bleed easily.  Psychiatric/Behavioral: Negative for confusion, depression and sleep disturbance. The patient is not nervous/anxious.     PHYSICAL EXAMINATION:  Blood pressure 125/77, pulse 77, temperature 98.1 F (36.7 C), temperature source Oral, resp. rate 18, height 5\' 5"  (1.651 m), weight 162 lb 1.6 oz (73.5 kg), SpO2 100 %.  ECOG PERFORMANCE STATUS: 1 - Symptomatic but completely ambulatory  Physical Exam  Constitutional: Oriented to person, place, and time and well-developed, well-nourished, and in no distress. No distress.  HENT:  Head: Normocephalic and atraumatic.   Mouth/Throat: Oropharynx is clear and moist. No oropharyngeal exudate.  Eyes: Conjunctivae are normal. Right eye exhibits no discharge. Left eye exhibits no discharge. No scleral icterus.  Neck: Normal range of motion. Neck supple.  Cardiovascular: Normal rate, regular rhythm, normal heart sounds and intact distal pulses.   Pulmonary/Chest: Effort normal and breath sounds normal. No respiratory distress. No wheezes. No rales.  Abdominal: Soft. Bowel sounds are normal. Exhibits no distension and no mass. There is no tenderness.  Musculoskeletal: Normal range of motion. Exhibits no edema.  Lymphadenopathy:    No cervical adenopathy.  Neurological: Alert and oriented to person, place, and time. Exhibits normal muscle tone. Gait normal. Coordination normal.  Skin: Skin is warm and dry. No rash noted. Not diaphoretic. No erythema. No pallor.  Psychiatric: Mood, memory and judgment normal.  Vitals reviewed.  LABORATORY DATA: Lab Results  Component Value Date   WBC 6.3 07/10/2017   HGB 11.6 (L) 07/10/2017   HCT 37.0 (L) 07/10/2017   MCV 83.1 07/10/2017   PLT 209 07/10/2017      Chemistry      Component Value Date/Time   NA 143 07/10/2017 1305   NA 141 05/20/2017 1425   K 3.7 07/10/2017 1305   K 4.3  05/20/2017 1425   CL 101 07/10/2017 1305   CL 105 12/08/2012 1504   CO2 34 (H) 07/10/2017 1305   CO2 31 (H) 05/20/2017 1425   BUN 21 07/10/2017 1305   BUN 17.8 05/20/2017 1425   CREATININE 1.00 07/10/2017 1305   CREATININE 0.9 05/20/2017 1425      Component Value Date/Time   CALCIUM 9.6 07/10/2017 1305   CALCIUM 9.4 05/20/2017 1425   ALKPHOS 78 07/10/2017 1305   ALKPHOS 72 05/20/2017 1425   AST 16 07/10/2017 1305   AST 15 05/20/2017 1425   ALT 9 07/10/2017 1305   ALT <6 05/20/2017 1425   BILITOT 0.4 07/10/2017 1305   BILITOT 0.25 05/20/2017 1425       RADIOGRAPHIC STUDIES:  No results found.   ASSESSMENT/PLAN:   72 year old gentleman with the following issues:     1. 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 He has an elevated PSA up to 33 in May of 2013.   He is currently on Zytiga and have tolerated it well since 2013. He continues to tolerate this medication without any complications.   His PSA remains relatively stable.  PSA is pending today.  CT scan and bone scan obtained on April 07, 2017 showed a pelvic mass arising from the prostate which increased since prior examination.  He completed radiation to the pelvis in December 2018.  Plan is to continue with Zytiga at this time.  May consider salvage therapy if his disease progresses systemically.  2. Bone directed therapy. On Xgeva monthly with calcium supplementation. Risks and benefits of continuing Xgeva reviewed and is agreeable to continue.  3. Androgen deprivation: Lupron will be given today and repeated in 4 months (May 2019).  4. Lower back pain/ hip pain: MRI showed some spinal stenosis and currently using Dilaudid as needed.  5. Followup: 6 weeks to follow-up.   Orders Placed This Encounter  Procedures  . CBC with Differential (Cancer Center Only)    Standing Status:   Future    Standing Expiration Date:   07/10/2018  . CMP ([facility] only)    Standing Status:   Future    Standing Expiration Date:   07/10/2018  . Prostate-Specific AG, Serum    Standing Status:   Future    Standing Expiration Date:   07/10/2018    Mikey Bussing, DNP, AGPCNP-BC, AOCNP 07/10/17

## 2017-07-11 LAB — PROSTATE-SPECIFIC AG, SERUM (LABCORP): PROSTATE SPECIFIC AG, SERUM: 64.6 ng/mL — AB (ref 0.0–4.0)

## 2017-07-13 ENCOUNTER — Telehealth: Payer: Self-pay | Admitting: Pharmacist

## 2017-07-13 NOTE — Telephone Encounter (Signed)
Oral Oncology Pharmacist Encounter  Received call from [facility] with request for clinic notes to complete prior authorization for continued treatment with Zytifga (abiraterone). Clinic notes faxed to [phone], received successful fax confirmation.  Oral Oncology Clinic will continue to follow.  Johny Drilling, PharmD, BCPS, BCOP 07/13/2017 10:01 AM Oral Oncology Clinic [phone]

## 2017-07-16 ENCOUNTER — Encounter: Payer: Self-pay | Admitting: Urology

## 2017-07-16 ENCOUNTER — Ambulatory Visit
Admission: RE | Admit: 2017-07-16 | Discharge: 2017-07-16 | Disposition: A | Discharge status: Home / Self Care | Payer: Medicare Other | Source: Ambulatory Visit | Attending: Urology | Admitting: Urology

## 2017-07-16 ENCOUNTER — Other Ambulatory Visit: Payer: Self-pay

## 2017-07-16 VITALS — BP 155/86 | HR 63 | Temp 98.5°F | Resp 20 | Ht 65.0 in | Wt 167.0 lb

## 2017-07-16 DIAGNOSIS — C61 Malignant neoplasm of prostate: Secondary | ICD-10-CM

## 2017-07-16 NOTE — Progress Notes (Signed)
Radiation Oncology         [phone] ________________________________  Name: [name] Mathews MRN: [id number]  Date: 07/16/2017  DOB: 1946-02-03  Post Treatment Note  CC: Mercy Riding, MD  [name], MD  Diagnosis:   72 y.o. 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   Interval Since Last Radiation:  5 weeks  05/11/17 - 06/09/17 :   The prostate fossa was treated to 52.5 Gy in 20 fractions of 2.63 Gy  Narrative:  The patient returns today for routine follow-up.  He tolerated radiation treatment relatively well.  He experienced some minor urinary irritation and modest fatigue.  He experienced nocturia 5 to 6 times per night, mild dysuria, weak stream, hesitancy and intermittency but denied having any hematuria or having fatigue.  He did not feel that he was able to empty his bladder completely and had increased frequency every15 to 30 minutes with small volume voids with straining and occasional leakage after urination. He reported soft BMs 3 times a day.                              On review of systems, the patient states that he is doing very well and has had significant relief with BMs and urination.  He is now able to have comfortable BMs and his urine stream is stronger. He feels he is emptying his bladder well with voiding and no longer has to strain with voiding.  He denies dysuria, gross hematuria or incontinence.  He continues with mild daytime frequency and nocturia x4-5/night which is back to his baseline prior to radiotherapy.  He is currently pleased with his progress. He reports a healthy appetite and is maintaining his weight.  He has a good energy level.   ALLERGIES:  is allergic to bee venom; wasp venom; and percocet [oxycodone-acetaminophen].  Meds: Current Outpatient Medications  Medication Sig Dispense Refill  . abiraterone acetate (ZYTIGA) 250 MG tablet Take 4 tablets (1,000 mg total) by mouth daily. Take on an  empty stomach 1 hour before or 2 hours after a meal 120 tablet 0  . aspirin EC 81 MG tablet Take 1 tablet (81 mg total) by mouth daily. 90 tablet 3  . atorvastatin (LIPITOR) 20 MG tablet Take 1 tablet (20 mg total) by mouth daily. 90 tablet 3  . denosumab (XGEVA) 120 MG/1.7ML SOLN Inject 120 mg into the skin every 30 (thirty) days.    . DULoxetine (CYMBALTA) 60 MG capsule Take 1 capsule (60 mg total) by mouth daily. 90 capsule 2  . hydrochlorothiazide (HYDRODIURIL) 25 MG tablet Take 1 tablet (25 mg total) by mouth daily. 90 tablet 3  . leuprolide (LUPRON) 30 MG injection Inject 30 mg into the muscle every 4 (four) months.    . potassium chloride 20 MEQ TBCR One tablet a day 30 tablet 0  . predniSONE (DELTASONE) 5 MG tablet TAKE 1 TABLET (5 MG TOTAL) BY MOUTH 2 (TWO) TIMES DAILY. 60 tablet 2  . rOPINIRole (REQUIP) 2 MG tablet Take 1 tablet (2 mg total) by mouth at bedtime. 90 tablet 3  . EPIPEN 2-PAK 0.3 MG/0.3ML SOAJ injection Reported on 01/04/2016    . HYDROmorphone (DILAUDID) 2 MG tablet Take 1 tablet (2 mg total) by mouth every 4 (four) hours as needed for severe pain. (Patient not taking: Reported on 07/10/2017) 60 tablet 0   No current facility-administered medications for this  encounter.     Physical Findings:  height is 5\' 5"  (1.651 m) and weight is 167 lb (75.8 kg). His oral temperature is 98.5 F (36.9 C). His blood pressure is 155/86 (abnormal) and his pulse is 63. His respiration is 20 and oxygen saturation is 97%.  Pain Assessment Pain Score: 0-No pain/10 In general this is a well appearing African American male in no acute distress. He's alert and oriented x4 and appropriate throughout the examination. Cardiopulmonary assessment is negative for acute distress and he exhibits normal effort.   Lab Findings: Lab Results  Component Value Date   WBC 6.3 07/10/2017   HGB 11.6 (L) 07/10/2017   HCT 37.0 (L) 07/10/2017   MCV 83.1 07/10/2017   PLT 209 07/10/2017     Radiographic  Findings: No results found.  Impression/Plan: 1. 72 y.o. 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  He has recovered well from the effects of radiotherapy and has had significant improvement in his bowel and bladder sxs which were present prior to treatment.  He is pleased with his progress to date.  We discussed that while we are happy to continue to participate in his care if clinically indicated, at this point, we will plan to see him back as needed.  He will continue his routine follow up for disease management under the care and direction of Dr. Alen Blew.  He appears to have a good understanding and is comfortable with this plan.  He knows to call at any time with questions or concerns related to his previous radiotherapy.    Nicholos Johns, PA-C

## 2017-07-16 NOTE — Addendum Note (Signed)
Encounter addended by: Malena Edman, RN on: 07/16/2017 10:27 AM  Actions taken: Charge Capture section accepted

## 2017-07-24 ENCOUNTER — Other Ambulatory Visit: Payer: Self-pay | Admitting: *Deleted

## 2017-07-24 ENCOUNTER — Encounter: Payer: Self-pay | Admitting: *Deleted

## 2017-07-24 DIAGNOSIS — C61 Malignant neoplasm of prostate: Secondary | ICD-10-CM

## 2017-07-24 MED ORDER — ABIRATERONE ACETATE 250 MG PO TABS: 1000.0000 mg | ORAL_TABLET | Freq: Every day | ORAL | 0 refills | Status: DC

## 2017-08-12 ENCOUNTER — Ambulatory Visit (INDEPENDENT_AMBULATORY_CARE_PROVIDER_SITE_OTHER): Payer: Medicare Other | Admitting: Student

## 2017-08-12 ENCOUNTER — Encounter: Payer: Self-pay | Admitting: Student

## 2017-08-12 ENCOUNTER — Other Ambulatory Visit: Payer: Self-pay

## 2017-08-12 VITALS — BP 124/68 | HR 73 | Temp 98.4°F | Ht 65.0 in | Wt 168.0 lb

## 2017-08-12 DIAGNOSIS — R519 Headache, unspecified: Secondary | ICD-10-CM

## 2017-08-12 DIAGNOSIS — R51 Headache: Secondary | ICD-10-CM | POA: Diagnosis not present

## 2017-08-12 DIAGNOSIS — Z1211 Encounter for screening for malignant neoplasm of colon: Secondary | ICD-10-CM | POA: Diagnosis not present

## 2017-08-12 DIAGNOSIS — Z8669 Personal history of other diseases of the nervous system and sense organs: Secondary | ICD-10-CM | POA: Diagnosis not present

## 2017-08-12 MED ORDER — SUMATRIPTAN SUCCINATE 50 MG PO TABS: 50.0000 mg | ORAL_TABLET | Freq: Once | ORAL | 0 refills | Status: DC

## 2017-08-12 NOTE — Patient Instructions (Signed)
It was great seeing you today! We have addressed the following issues today  Headache: We gave you a prescription for Imitrex in case you headache recurs.  Colon cancer screening and immunizations: we would appreciate if you can get [place] the results of your most recent colonoscopy in your immunization records from your previous providers.  If we did any lab work today, and the results require attention, either me or my nurse will get in touch with you. If everything is normal, you will get a letter in mail and a message via . If you don't hear from [place] in two weeks, please give [place] a call. Otherwise, we look forward to seeing you again at your next visit. If you have any questions or concerns before then, please call the clinic at [phone].  Please bring all your medications to every doctors visit  Sign up for My Chart to have easy access to your labs results, and communication with your Primary care physician.    Please check-out at the front desk before leaving the clinic.    Take Care,   Dr. Cyndia Skeeters

## 2017-08-12 NOTE — Progress Notes (Signed)
Subjective:    [name] Mathews is a 72 y.o. old male here for headache  HPI Headache: for 5 days. Worse 4 days ago. 3/10 today. Describes the pain as sore. Pain is over his right side front to back radiating to right shoulder. Denies nausea, vomiting, fever, vision change, focal weakness, numbness or tingling. Photophobia three days ago that has resolved. Denies eye lacrimation or nasal congestion.  Denies a skin rash. He has history of cluster headache. He used to take imitrex in the past.  He says his pain is typical for his cluster headache but not as severe. He has history of prostate cancer status post radiation.  Still on chemotherapy.    PMH/Problem List: has Recurrent prostate adenocarcinoma; Back pain; Routine adult health maintenance; Essential hypertension; Hyperlipidemia; Cervical lymphadenopathy; and Neuropathic pain on their problem list.   has a past medical history of Hyperlipidemia, Hypertension, Prostate cancer ([place]), and Restless leg syndrome.  FH:  Family History  Problem Relation Age of Onset  . Heart attack Mother 71       died of heart attack  . Cervical cancer Sister 42  . Stroke Brother 32    [place] Social History   Tobacco Use  . Smoking status: Former Smoker    Packs/day: 0.25    Years: 6.00    Pack years: 1.50    Start date: 1969    Last attempt to quit: 1975    Years since quitting: 44.1  . Smokeless tobacco: Never Used  Substance Use Topics  . Alcohol use: No  . Drug use: No    Review of Systems Review of systems negative except for pertinent positives and negatives in history of present illness above.     Objective:     Vitals:   08/12/17 0944  BP: 124/68  Pulse: 73  Temp: 98.4 F (36.9 C)  TempSrc: Oral  SpO2: 96%  Weight: 168 lb (76.2 kg)  Height: 5\' 5"  (1.651 m)   Body mass index is 27.96 kg/m.  Physical Exam  GEN: appears well, no apparent distress. Head: normocephalic and atraumatic.  Mild tenderness over the right side all the way  to his neck.  Eyes: conjunctiva without injection, sclera anicteric, arcus senilis, no photophobia HEM: negative for cervical or periauricular lymphadenopathies CVS: RRR, nl s1 & s2, no murmurs, no edema RESP: no IWOB MSK: no focal tenderness or notable swelling SKIN: no apparent skin lesion NEURO: AAO x4, cranial nerves II through XII intact, motor 5/5 in all extremities, light sensation intact in all dermatomes.  PSYCH: euthymic mood with congruent affect     Assessment and Plan:  1. Nonintractable headache, unspecified chronicity pattern, unspecified headache type: Likely cluster headache given history of this.  Patient states that this is typical for his cluster headache except that the pain is not severe.  Pain has improved.  He had 3/10 pain currently.  Unlikely migraine.  Unlikely tension with one-sided headache.  No red flags except for history of prostate cancer.  However, headache and improving to think of metastasis to the brain.  He has no constitutional symptoms either.  Given prescription for Imitrex in case his headache recurs. - SUMAtriptan (IMITREX) 50 MG tablet; Take 1 tablet (50 mg total) by mouth once for 1 dose. May repeat in 2 hours if headache persists or recurs. Don't take more than 200 mg in a day  Dispense: 10 tablet; Refill: 0  2. Colon cancer screening: patient to get [place] the results of his recent colonoscopy  and immunization records from the [place].  Return in about 6 months (around 02/09/2018) for Annual physical.  Mercy Riding, MD 08/12/17 Pager: [phone]

## 2017-08-28 ENCOUNTER — Inpatient Hospital Stay: Payer: Medicare Other

## 2017-08-28 ENCOUNTER — Inpatient Hospital Stay: Payer: Medicare Other | Attending: Oncology | Admitting: Oncology

## 2017-08-28 ENCOUNTER — Telehealth: Payer: Self-pay | Admitting: Oncology

## 2017-08-28 VITALS — BP 140/73 | HR 65 | Temp 98.4°F | Resp 20 | Ht 65.0 in | Wt 167.0 lb

## 2017-08-28 VITALS — BP 123/78 | HR 54 | Temp 98.2°F | Resp 20

## 2017-08-28 DIAGNOSIS — C7951 Secondary malignant neoplasm of bone: Secondary | ICD-10-CM | POA: Insufficient documentation

## 2017-08-28 DIAGNOSIS — Z79899 Other long term (current) drug therapy: Secondary | ICD-10-CM | POA: Insufficient documentation

## 2017-08-28 DIAGNOSIS — C61 Malignant neoplasm of prostate: Secondary | ICD-10-CM

## 2017-08-28 DIAGNOSIS — E291 Testicular hypofunction: Secondary | ICD-10-CM

## 2017-08-28 DIAGNOSIS — Z5111 Encounter for antineoplastic chemotherapy: Secondary | ICD-10-CM | POA: Diagnosis present

## 2017-08-28 LAB — CMP (CANCER CENTER ONLY)
ALBUMIN: 3.1 g/dL — AB (ref 3.5–5.0)
ALT: 9 U/L (ref 0–55)
AST: 17 U/L (ref 5–34)
Alkaline Phosphatase: 76 U/L (ref 40–150)
Anion gap: 8 (ref 3–11)
BUN: 25 mg/dL (ref 7–26)
CHLORIDE: 102 mmol/L (ref 98–109)
CO2: 33 mmol/L — AB (ref 22–29)
CREATININE: 0.93 mg/dL (ref 0.70–1.30)
Calcium: 10.6 mg/dL — ABNORMAL HIGH (ref 8.4–10.4)
GFR, Est AFR Am: >60 mL/min (ref >60)
GFR, Estimated: >60 mL/min (ref >60)
Glucose, Bld: 95 mg/dL (ref 70–140)
POTASSIUM: 3.8 mmol/L (ref 3.5–5.1)
Sodium: 143 mmol/L (ref 136–145)
Total Bilirubin: 0.3 mg/dL (ref 0.2–1.2)
Total Protein: 7 g/dL (ref 6.4–8.3)

## 2017-08-28 LAB — CBC WITH DIFFERENTIAL (CANCER CENTER ONLY)
Basophils Absolute: 0 10*3/uL (ref 0.0–0.1)
Basophils Relative: 0 %
EOS PCT: 2 %
Eosinophils Absolute: 0.1 10*3/uL (ref 0.0–0.5)
HCT: 33.6 % — ABNORMAL LOW (ref 38.4–49.9)
Hemoglobin: 10.8 g/dL — ABNORMAL LOW (ref 13.0–17.1)
LYMPHS PCT: 13 %
Lymphs Abs: 0.6 10*3/uL — ABNORMAL LOW (ref 0.9–3.3)
MCH: 25.9 pg — AB (ref 27.2–33.4)
MCHC: 32.2 g/dL (ref 32.0–36.0)
MCV: 80.6 fL (ref 79.3–98.0)
MONO ABS: 0.4 10*3/uL (ref 0.1–0.9)
Monocytes Relative: 9 %
Neutro Abs: 3.3 10*3/uL (ref 1.5–6.5)
Neutrophils Relative %: 76 %
PLATELETS: 243 10*3/uL (ref 140–400)
RBC: 4.17 MIL/uL — AB (ref 4.20–5.82)
RDW: 15.7 % — ABNORMAL HIGH (ref 11.0–14.6)
WBC: 4.5 10*3/uL (ref 4.0–10.3)

## 2017-08-28 MED ORDER — DENOSUMAB 120 MG/1.7ML ~~LOC~~ SOLN
120.0000 mg | Freq: Once | SUBCUTANEOUS | Status: AC
  Administered 2017-08-28: 120 mg via SUBCUTANEOUS

## 2017-08-28 MED ORDER — DENOSUMAB 120 MG/1.7ML ~~LOC~~ SOLN
SUBCUTANEOUS | Status: AC
  Filled 2017-08-28: qty 1.7

## 2017-08-28 NOTE — Progress Notes (Signed)
Hematology and Oncology Follow Up Visit  [name] Mathews [id number] 10-Mar-1946 72 y.o. 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 He was initially diagnosed  2001, gleason score 4+5= 9.  Prior Therapy: 1. He S/P  attempted prostatectomy and found to have a left pelvic enlargement that was biopsy proven to be metastatic adenocarcinoma involving 2-3 left pelvic lymph nodes. He was started on Lupron.  2. Casodex was added to firmagon in 2009 because of PSA rise.  3. 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 He developed bony metastasis and PSA eventually went up to 65.  4. He is status post radiation therapy to the prostate fossa completed in December 2018. He received 52.5 gr 20 fractions.  Current therapy:  Zytiga 1000 mg po daily started in 09/2011 with prednisone 5 mg daily. Xgeva 120 mg started on 10/31/2011. This is given monthly.  Lupron 30 mg every 4 months.    Interim History: [name] Mathews is here for follow-up by himself. He reports continuous improvement in his overall health after completing radiation therapy in December 2018. He reports his urination and defecation has improved dramatically. He is no longer reporting any pelvic pain or discomfort. He denied any frequency urgency or nocturia. His performance status and activity level remain excellent. He has not any bone pain or pathological fractures.  He has not been taking Zytiga for the last month as he ran out of his medication and currently applying for co-pay assistance. He did take it in December and January without any complications at that time. He denied any excessive fatigue or tiredness.   He does not report any headaches, blurry vision, syncope or seizures. He does not report any fevers or chills or sweats.  His appetite and weight remain stable. He denied any chest pain,  palpitation, orthopnea or leg edema. He does not report any cough, wheezing or hemoptysis. He denies any nausea, vomiting or abdominal pain. He denied any constipation or diarrhea. He does not report any frequency urgency or hesitancy. He denied any skin rashes or lesions. He denied any bone pain or arthralgias. He denied any lymphadenopathy or petechiae. Remaining review of system is negative.  Medications: I have reviewed the patient's current medications. Current Outpatient Medications  Medication Sig Dispense Refill  . abiraterone acetate (ZYTIGA) 250 MG tablet Take 4 tablets (1,000 mg total) by mouth daily. Take on an empty stomach 1 hour before or 2 hours after a meal 120 tablet 0  . aspirin EC 81 MG tablet Take 1 tablet (81 mg total) by mouth daily. 90 tablet 3  . atorvastatin (LIPITOR) 20 MG tablet Take 1 tablet (20 mg total) by mouth daily. 90 tablet 3  . denosumab (XGEVA) 120 MG/1.7ML SOLN Inject 120 mg into the skin every 30 (thirty) days.    . DULoxetine (CYMBALTA) 60 MG capsule Take 1 capsule (60 mg total) by mouth daily. 90 capsule 2  . EPIPEN 2-PAK 0.3 MG/0.3ML SOAJ injection Reported on 01/04/2016    . hydrochlorothiazide (HYDRODIURIL) 25 MG tablet Take 1 tablet (25 mg total) by mouth daily. 90 tablet 3  . HYDROmorphone (DILAUDID) 2 MG tablet Take 1 tablet (2 mg total) by mouth every 4 (four) hours as needed for severe pain. (Patient not taking: Reported on 07/10/2017) 60 tablet 0  . leuprolide (LUPRON) 30 MG injection Inject 30 mg into the muscle every 4 (four) months.    [name]  potassium chloride 20 MEQ TBCR One tablet a day 30 tablet 0  . predniSONE (DELTASONE) 5 MG tablet TAKE 1 TABLET (5 MG TOTAL) BY MOUTH 2 (TWO) TIMES DAILY. 60 tablet 2  . rOPINIRole (REQUIP) 2 MG tablet Take 1 tablet (2 mg total) by mouth at bedtime. 90 tablet 3  . SUMAtriptan (IMITREX) 50 MG tablet Take 1 tablet (50 mg total) by mouth once for 1 dose. May repeat in 2 hours if headache persists or recurs. Don't take  more than 200 mg in a day 10 tablet 0   No current facility-administered medications for this visit.      Allergies:  Allergies  Allergen Reactions  . Bee Venom Anaphylaxis, Shortness Of Breath and Swelling    Tongue swelling.   . Wasp Venom Anaphylaxis, Shortness Of Breath and Swelling    Tongue swelling   . Percocet [Oxycodone-Acetaminophen] Palpitations    His past medical history was reviewed today and is unchanged.    Physical Exam: Blood pressure 140/73, pulse 65, temperature 98.4 F (36.9 C), temperature source Oral, resp. rate 20, height 5\' 5"  (1.651 m), weight 167 lb (75.8 kg), SpO2 99 %.   ECOG: 0 General appearance: Alert, awake gentleman appeared without distress. Head: Normocephalic, without obvious abnormality  Oropharynx: Without thrush or ulcers. Eyes: No scleral icterus. Lymph nodes: No lymphadenopathy palpated in the cervical, axillary and supraclavicular areas. Heart: Regular rate and rhythm without murmurs or gallops. Lung: clear to auscultation without rhonchi, wheezes or dullness to percussion. Abdomen: Soft, nontender without any rebound or guarding. Musculoskeletal: No joint deformity or effusion. Neurological: No motor, sensory deficits. Skin: No rashes or lesions.   Lab Results: Lab Results  Component Value Date   WBC 6.3 07/10/2017   HGB 11.6 (L) 07/10/2017   HCT 37.0 (L) 07/10/2017   MCV 83.1 07/10/2017   PLT 209 07/10/2017     Chemistry      Component Value Date/Time   NA 143 07/10/2017 1305   NA 141 05/20/2017 1425   K 3.7 07/10/2017 1305   K 4.3 05/20/2017 1425   CL 101 07/10/2017 1305   CL 105 12/08/2012 1504   CO2 34 (H) 07/10/2017 1305   CO2 31 (H) 05/20/2017 1425   BUN 21 07/10/2017 1305   BUN 17.8 05/20/2017 1425   CREATININE 1.00 07/10/2017 1305   CREATININE 0.9 05/20/2017 1425      Component Value Date/Time   CALCIUM 9.6 07/10/2017 1305   CALCIUM 9.4 05/20/2017 1425   ALKPHOS 78 07/10/2017 1305   ALKPHOS 72  05/20/2017 1425   AST 16 07/10/2017 1305   AST 15 05/20/2017 1425   ALT 9 07/10/2017 1305   ALT <6 05/20/2017 1425   BILITOT 0.4 07/10/2017 1305   BILITOT 0.25 05/20/2017 1425      Results for CEDRICK, PARTAIN (MRN [id number]) as of 08/28/2017 13:40  Ref. Range 03/03/2017 10:08 04/15/2017 12:23 05/20/2017 14:25 07/10/2017 13:05  Prostate Specific Ag, Serum Latest Ref Range: 0.0 - 4.0 ng/mL 40.7 (H) 36.1 (H) 41.1 (H) 64.6 (H)     Impression and Plan:   72 year old gentleman with the following issues:    1. Castration-resistance prostate cancer with documented disease to the bone and lymphadenopathy. He is status post therapy outlined above and has been on [place] since 2013.  He completed radiation therapy to the prostate fossa due to local recurrence at that time.  His PSA on January 2019 showed increase up to 64.6. Options of therapy were reviewed today  which include continuing Zytiga versus using different salvage therapy. His best option for salvage therapy at this time would be systemic chemotherapy utilizing Taxotere. Risks and benefits associated with this therapy was reviewed today which include nausea, vomiting, myelosuppression and peripheral neuropathy.  After discussion today, given the fact that is asymptomatic he would like to defer the chemotherapy option for the time being. The plan is to continue to monitor his PSA and plan to restart Zytiga in the immediate future. If his Zytiga cannot be resumed or his PSA continues to rise, systemic chemotherapy will be his next option.   2. Bone directed therapy. Risks and benefits of continuing Delton See was discussed today. Long-term, percussion associated with this drug including hypocalcemia and osteonecrosis of the jaw was discussed. He is agreeable to continue.  3. Androgen deprivation: He is currently on Lupron without any complications. I recommended continuing this medication indefinitely. Long-term complications associated with this  medication was reviewed again he is agreeable to continue.  4. Lower back pain/ hip pain: No pain reported at this time. His pain is predominantly related to local recurrence of his prostate cancer that has resolved after radiation.  5. Followup: In 5 weeks to follow his progress.  25  minutes was spent with the patient face-to-face today.  More than 50% of time was dedicated to patient counseling, education, discussing his different treatment options and coordination his multifaceted care.   Zola Button, MD 3/8/20191:36 PM

## 2017-08-28 NOTE — Patient Instructions (Signed)

## 2017-08-28 NOTE — Telephone Encounter (Signed)
Gave patient AVs and calendar of upcoming April appointments.  °

## 2017-08-29 LAB — PROSTATE-SPECIFIC AG, SERUM (LABCORP): PROSTATE SPECIFIC AG, SERUM: 44.9 ng/mL — AB (ref 0.0–4.0)

## 2017-08-31 ENCOUNTER — Telehealth: Payer: Self-pay | Admitting: Pharmacy Technician

## 2017-08-31 NOTE — Telephone Encounter (Signed)
Oral Oncology Patient Advocate Encounter  Received a partially completed application for [employer] and [place].  The application had already been signed by the MD and the patient.  I have completed the application and submitted it to [employer] in an effort to reduce patient's out of pocket expense for Zytiga to $0.    Application completed and faxed to [phone].   [employer] patient assistance phone number for follow up is [phone].   This encounter will be updated until final determination.  Fabio Asa. Melynda Keller, [facility] [phone] 08/31/2017 4:27 PM

## 2017-09-01 ENCOUNTER — Telehealth: Payer: Self-pay | Admitting: *Deleted

## 2017-09-01 NOTE — Telephone Encounter (Signed)
-----   Message from [name], MD sent at 09/01/2017  8:08 AM EDT ----- Please let him know his PSA is down.

## 2017-09-01 NOTE — Telephone Encounter (Signed)
Spoke with patient, gave results of last PSA 

## 2017-09-04 NOTE — Telephone Encounter (Signed)
Oral Oncology Patient Advocate Encounter  Received communication from [place] that some information was missing on the application for assistance.  I updated the physician signature date and Tax ID on the form and faxed the information back to [place] and [place].

## 2017-09-08 ENCOUNTER — Other Ambulatory Visit: Payer: Self-pay

## 2017-09-08 DIAGNOSIS — M792 Neuralgia and neuritis, unspecified: Secondary | ICD-10-CM

## 2017-09-08 DIAGNOSIS — I10 Essential (primary) hypertension: Secondary | ICD-10-CM

## 2017-09-08 DIAGNOSIS — C61 Malignant neoplasm of prostate: Secondary | ICD-10-CM

## 2017-09-08 MED ORDER — HYDROCHLOROTHIAZIDE 25 MG PO TABS: 25.0000 mg | ORAL_TABLET | Freq: Every day | ORAL | 3 refills | Status: DC

## 2017-09-08 MED ORDER — PREDNISONE 5 MG PO TABS: ORAL_TABLET | ORAL | 2 refills | Status: DC

## 2017-09-08 MED ORDER — DULOXETINE HCL 60 MG PO CPEP: 60.0000 mg | ORAL_CAPSULE | Freq: Every day | ORAL | 2 refills | Status: DC

## 2017-09-08 NOTE — Telephone Encounter (Signed)
Pharmacy calling on behalf of pt requesting refills of duloxetine, hctz and prednisone. Pharmacy call back [phone] Wallace Cullens, RN

## 2017-10-02 ENCOUNTER — Inpatient Hospital Stay (HOSPITAL_BASED_OUTPATIENT_CLINIC_OR_DEPARTMENT_OTHER): Payer: Medicare Other | Admitting: Oncology

## 2017-10-02 ENCOUNTER — Inpatient Hospital Stay: Payer: Medicare Other

## 2017-10-02 ENCOUNTER — Telehealth: Payer: Self-pay

## 2017-10-02 ENCOUNTER — Inpatient Hospital Stay: Payer: Medicare Other | Attending: Oncology

## 2017-10-02 VITALS — BP 148/80 | HR 71 | Temp 98.4°F | Resp 18 | Ht 65.0 in | Wt 173.0 lb

## 2017-10-02 DIAGNOSIS — C61 Malignant neoplasm of prostate: Secondary | ICD-10-CM

## 2017-10-02 DIAGNOSIS — C7951 Secondary malignant neoplasm of bone: Secondary | ICD-10-CM | POA: Insufficient documentation

## 2017-10-02 DIAGNOSIS — E291 Testicular hypofunction: Secondary | ICD-10-CM | POA: Diagnosis not present

## 2017-10-02 LAB — CMP (CANCER CENTER ONLY)
ALBUMIN: 3.1 g/dL — AB (ref 3.5–5.0)
ALT: 8 U/L (ref 0–55)
ANION GAP: 8 (ref 3–11)
AST: 19 U/L (ref 5–34)
Alkaline Phosphatase: 79 U/L (ref 40–150)
BUN: 14 mg/dL (ref 7–26)
CHLORIDE: 104 mmol/L (ref 98–109)
CO2: 32 mmol/L — AB (ref 22–29)
Calcium: 8.9 mg/dL (ref 8.4–10.4)
Creatinine: 0.94 mg/dL (ref 0.70–1.30)
GFR, Est AFR Am: >60 mL/min (ref >60)
GFR, Estimated: >60 mL/min (ref >60)
GLUCOSE: 115 mg/dL (ref 70–140)
POTASSIUM: 3.8 mmol/L (ref 3.5–5.1)
SODIUM: 144 mmol/L (ref 136–145)
TOTAL PROTEIN: 6.8 g/dL (ref 6.4–8.3)
Total Bilirubin: 0.3 mg/dL (ref 0.2–1.2)

## 2017-10-02 LAB — CBC WITH DIFFERENTIAL (CANCER CENTER ONLY)
BASOS PCT: 0 %
Basophils Absolute: 0 10*3/uL (ref 0.0–0.1)
EOS ABS: 0.2 10*3/uL (ref 0.0–0.5)
Eosinophils Relative: 3 %
HCT: 33.9 % — ABNORMAL LOW (ref 38.4–49.9)
Hemoglobin: 10.7 g/dL — ABNORMAL LOW (ref 13.0–17.1)
Lymphocytes Relative: 12 %
Lymphs Abs: 0.8 10*3/uL — ABNORMAL LOW (ref 0.9–3.3)
MCH: 26.8 pg — ABNORMAL LOW (ref 27.2–33.4)
MCHC: 31.6 g/dL — AB (ref 32.0–36.0)
MCV: 84.8 fL (ref 79.3–98.0)
MONO ABS: 0.4 10*3/uL (ref 0.1–0.9)
MONOS PCT: 6 %
Neutro Abs: 5.5 10*3/uL (ref 1.5–6.5)
Neutrophils Relative %: 79 %
PLATELETS: 186 10*3/uL (ref 140–400)
RBC: 4 MIL/uL — ABNORMAL LOW (ref 4.20–5.82)
RDW: 15.7 % — AB (ref 11.0–14.6)
WBC Count: 6.9 10*3/uL (ref 4.0–10.3)

## 2017-10-02 MED ORDER — LEUPROLIDE ACETATE (4 MONTH) 30 MG IM KIT
30.0000 mg | PACK | Freq: Once | INTRAMUSCULAR | Status: DC
  Filled 2017-10-02: qty 30

## 2017-10-02 MED ORDER — DENOSUMAB 120 MG/1.7ML ~~LOC~~ SOLN
SUBCUTANEOUS | Status: AC
  Filled 2017-10-02: qty 1.7

## 2017-10-02 MED ORDER — DENOSUMAB 120 MG/1.7ML ~~LOC~~ SOLN
120.0000 mg | Freq: Once | SUBCUTANEOUS | Status: AC
  Administered 2017-10-02: 120 mg via SUBCUTANEOUS

## 2017-10-02 NOTE — Telephone Encounter (Signed)
error 

## 2017-10-02 NOTE — Telephone Encounter (Signed)
Printed avs and calender of upcoming appointment. Per 4/12 los

## 2017-10-02 NOTE — Progress Notes (Signed)
Hematology and Oncology Follow Up Visit  [name] Mathews [id number] 11/03/1945 72 y.o. 10/02/2017 1:35 PM    Principle Diagnosis: 72 year old man with advanced prostate cancer with disease to the bone and lymphadenopathy.  He was initially diagnosed in 2001 with a Gleason score 4+ 5 = and developed castration resistant disease in 2013.     Prior Therapy: 1. He was started on Lupron after attempted prostatectomy in 2001.  He was found to have advanced disease.  2. 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 He developed bony metastasis and PSA up to 55.  4. He is status post radiation therapy to the prostate fossa completed in December 2018. He received 52.5 gr 20 fractions.  Current therapy:  Zytiga 1000 mg po daily started in 09/2011 with prednisone 5 mg daily. Xgeva 120 mg started on 10/31/2011.  This is given every 4-6 weeks. Lupron 30 mg every 4 months.  His next injection will be scheduled in May 2019.   Interim History: [name] Mathews presents today for a follow-up.  Since last visit, he resumed Zytiga and prednisone without complications.  He was able to receive the medication free of charge.  He denies any new complications related to restarting this medication.  He denies any excessive fatigue or tiredness.  He denies any edema or arthralgias.  He denies any complications related to his previous radiation.  His urination and defecation has improved after completing radiation in December 2018.  His appetite and performance status is excellent.  He has gained weight since last visit.   He does not report any headaches, blurry vision, syncope or seizures. He does not report any fevers or chills or sweats.   He denied any chest pain, palpitation, orthopnea or leg edema. He does not report any cough, wheezing or hemoptysis. He denies any nausea, vomiting or abdominal pain. He denied any hematochezia or melena.  He does not report any frequency urgency or hesitancy. He denied any  skin rashes or lesions. He denied any lymphadenopathy or petechiae. Remaining review of system is negative.  Medications: I have reviewed the patient's current medications. Current Outpatient Medications  Medication Sig Dispense Refill  . abiraterone acetate (ZYTIGA) 250 MG tablet Take 4 tablets (1,000 mg total) by mouth daily. Take on an empty stomach 1 hour before or 2 hours after a meal 120 tablet 0  . aspirin EC 81 MG tablet Take 1 tablet (81 mg total) by mouth daily. 90 tablet 3  . atorvastatin (LIPITOR) 20 MG tablet Take 1 tablet (20 mg total) by mouth daily. 90 tablet 3  . DULoxetine (CYMBALTA) 60 MG capsule Take 1 capsule (60 mg total) by mouth daily. 90 capsule 2  . EPIPEN 2-PAK 0.3 MG/0.3ML SOAJ injection Reported on 01/04/2016    . hydrochlorothiazide (HYDRODIURIL) 25 MG tablet Take 1 tablet (25 mg total) by mouth daily. 90 tablet 3  . HYDROmorphone (DILAUDID) 2 MG tablet Take 1 tablet (2 mg total) by mouth every 4 (four) hours as needed for severe pain. 60 tablet 0  . leuprolide (LUPRON) 30 MG injection Inject 30 mg into the muscle every 4 (four) months.    . potassium chloride 20 MEQ TBCR One tablet a day 30 tablet 0  . predniSONE (DELTASONE) 5 MG tablet TAKE 1 TABLET (5 MG TOTAL) BY MOUTH 2 (TWO) TIMES DAILY. 60 tablet 2  . rOPINIRole (REQUIP) 2 MG tablet Take 1 tablet (2 mg total) by mouth at bedtime. 90 tablet 3  . denosumab (XGEVA)  120 MG/1.7ML SOLN Inject 120 mg into the skin every 30 (thirty) days.    . SUMAtriptan (IMITREX) 50 MG tablet Take 1 tablet (50 mg total) by mouth once for 1 dose. May repeat in 2 hours if headache persists or recurs. Don't take more than 200 mg in a day 10 tablet 0   No current facility-administered medications for this visit.      Allergies:  Allergies  Allergen Reactions  . Bee Venom Anaphylaxis, Shortness Of Breath and Swelling    Tongue swelling.   . Wasp Venom Anaphylaxis, Shortness Of Breath and Swelling    Tongue swelling   . Percocet  [Oxycodone-Acetaminophen] Palpitations    His past medical history was reviewed today and is unchanged.    Physical Exam: Blood pressure (!) 148/80, pulse 71, temperature 98.4 F (36.9 C), temperature source Oral, resp. rate 18, height 5\' 5"  (1.651 m), weight 173 lb (78.5 kg), SpO2 98 %.   ECOG: 0 General appearance: Well-appearing gentleman appeared without distress. Head: Atraumatic without abnormalities. Oropharynx: No thrush or ulcers noted. Eyes: Conjunctiva is clear without any scleral icterus. Lymph nodes: No cervical, axillary and supraclavicular lymphadenopathy palpated. Heart: Regular rate with S1 and S2 auscultated.  No murmurs. Lung: clear in all lung fields without any rhonchi, wheezes or dullness to percussion. Abdomen: Soft, nondistended without any rebound or guarding. Musculoskeletal: No clubbing or cyanosis.  Full range of motion noted in all extremities. Neurological: No motor, sensory deficits. Skin: No ecchymosis or petechiae.   Lab Results: Lab Results  Component Value Date   WBC 6.9 10/02/2017   HGB 11.6 (L) 07/10/2017   HCT 33.9 (L) 10/02/2017   MCV 84.8 10/02/2017   PLT 186 10/02/2017     Chemistry      Component Value Date/Time   NA 143 08/28/2017 1325   NA 141 05/20/2017 1425   K 3.8 08/28/2017 1325   K 4.3 05/20/2017 1425   CL 102 08/28/2017 1325   CL 105 12/08/2012 1504   CO2 33 (H) 08/28/2017 1325   CO2 31 (H) 05/20/2017 1425   BUN 25 08/28/2017 1325   BUN 17.8 05/20/2017 1425   CREATININE 0.93 08/28/2017 1325   CREATININE 0.9 05/20/2017 1425      Component Value Date/Time   CALCIUM 10.6 (H) 08/28/2017 1325   CALCIUM 9.4 05/20/2017 1425   ALKPHOS 76 08/28/2017 1325   ALKPHOS 72 05/20/2017 1425   AST 17 08/28/2017 1325   AST 15 05/20/2017 1425   ALT 9 08/28/2017 1325   ALT <6 05/20/2017 1425   BILITOT 0.3 08/28/2017 1325   BILITOT 0.25 05/20/2017 1425      Results for [name] Mathews (MRN [id number]) as of 10/02/2017 13:28   Ref. Range 05/20/2017 14:25 07/10/2017 13:05 08/28/2017 13:25  Prostate Specific Ag, Serum Latest Ref Range: 0.0 - 4.0 ng/mL 41.1 (H) 64.6 (H) 44.9 (H)      Impression and Plan:   72 year old gentleman with the following issues:    1.  Advanced castration-resistance prostate cancer diagnosed since 2013.  He has documented disease with lymphadenopathy and bone metastasis.  His PSA shows a reasonable decline after completing definitive radiation therapy to the prostate bed.  He is currently on Zytiga which was resumed without complications.  The natural course of this disease was reviewed again as well as treatment options.  Given his response to therapy at this time and decline in his PSA, he continues to like to defer systemic chemotherapy.  He understands this  option will be needed in the future but he is feeling well and given his PSA is dropping, he would like to use chemotherapy in the future if possible.  We will continue Zytiga and prednisone and monitor his PSA periodically.   2. Bone directed therapy.  He is currently receiving Xgeva without complications.  Long-term convocation associated with this medication was reviewed again including hypocalcemia and osteonecrosis of the jaw.  Agreeable to continue.   3. Androgen deprivation: He will receive Lupron every 4 months and will be due in May 2019.  Long-term complication associated with his medication was reviewed again including weight gain, osteoporosis among others.  4.  Pelvic pain: Resolved at this time after completing radiation therapy.  5. Followup: In 5 weeks to follow his progress.  25  minutes was spent with the patient face-to-face today.  More than 50% of time was dedicated to patient counseling, education, answering questions regarding his future plan of care.   Zola Button, MD 4/12/20191:35 PM

## 2017-10-03 LAB — PROSTATE-SPECIFIC AG, SERUM (LABCORP): PROSTATE SPECIFIC AG, SERUM: 33.4 ng/mL — AB (ref 0.0–4.0)

## 2017-10-26 ENCOUNTER — Other Ambulatory Visit: Payer: Self-pay | Admitting: Oncology

## 2017-10-26 DIAGNOSIS — C61 Malignant neoplasm of prostate: Secondary | ICD-10-CM

## 2017-10-27 ENCOUNTER — Other Ambulatory Visit: Payer: Self-pay | Admitting: *Deleted

## 2017-10-27 DIAGNOSIS — C61 Malignant neoplasm of prostate: Secondary | ICD-10-CM

## 2017-10-27 MED ORDER — ABIRATERONE ACETATE 250 MG PO TABS: ORAL_TABLET | ORAL | 11 refills | Status: DC

## 2017-11-12 ENCOUNTER — Inpatient Hospital Stay: Payer: Medicare Other

## 2017-11-12 ENCOUNTER — Telehealth: Payer: Self-pay | Admitting: Oncology

## 2017-11-12 ENCOUNTER — Inpatient Hospital Stay: Payer: Medicare Other | Admitting: Oncology

## 2017-11-12 MED ORDER — DENOSUMAB 120 MG/1.7ML ~~LOC~~ SOLN
SUBCUTANEOUS | Status: AC
  Filled 2017-11-12: qty 1.7
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
## 2017-11-12 NOTE — Telephone Encounter (Signed)
Called patient and r/s his missed appointment per 5/23 staff msg

## 2017-11-13 ENCOUNTER — Inpatient Hospital Stay (HOSPITAL_BASED_OUTPATIENT_CLINIC_OR_DEPARTMENT_OTHER): Payer: Medicare Other | Admitting: Oncology

## 2017-11-13 ENCOUNTER — Encounter: Payer: Self-pay | Admitting: Oncology

## 2017-11-13 ENCOUNTER — Other Ambulatory Visit: Payer: Self-pay | Admitting: Student

## 2017-11-13 ENCOUNTER — Inpatient Hospital Stay: Payer: Medicare Other

## 2017-11-13 ENCOUNTER — Telehealth: Payer: Self-pay | Admitting: Oncology

## 2017-11-13 ENCOUNTER — Inpatient Hospital Stay: Payer: Medicare Other | Attending: Oncology

## 2017-11-13 VITALS — BP 163/99 | HR 55 | Temp 98.4°F | Resp 18 | Ht 65.0 in | Wt 167.9 lb

## 2017-11-13 DIAGNOSIS — I10 Essential (primary) hypertension: Secondary | ICD-10-CM | POA: Diagnosis not present

## 2017-11-13 DIAGNOSIS — Z5111 Encounter for antineoplastic chemotherapy: Secondary | ICD-10-CM | POA: Diagnosis present

## 2017-11-13 DIAGNOSIS — E291 Testicular hypofunction: Secondary | ICD-10-CM | POA: Diagnosis not present

## 2017-11-13 DIAGNOSIS — C7951 Secondary malignant neoplasm of bone: Secondary | ICD-10-CM

## 2017-11-13 DIAGNOSIS — C61 Malignant neoplasm of prostate: Secondary | ICD-10-CM

## 2017-11-13 LAB — CBC WITH DIFFERENTIAL (CANCER CENTER ONLY)
BASOS PCT: 0 %
Basophils Absolute: 0 10*3/uL (ref 0.0–0.1)
EOS ABS: 0.1 10*3/uL (ref 0.0–0.5)
EOS PCT: 1 %
HCT: 36.1 % — ABNORMAL LOW (ref 38.4–49.9)
HEMOGLOBIN: 11.3 g/dL — AB (ref 13.0–17.1)
LYMPHS ABS: 1 10*3/uL (ref 0.9–3.3)
Lymphocytes Relative: 16 %
MCH: 26.2 pg — ABNORMAL LOW (ref 27.2–33.4)
MCHC: 31.3 g/dL — AB (ref 32.0–36.0)
MCV: 83.8 fL (ref 79.3–98.0)
MONO ABS: 0.4 10*3/uL (ref 0.1–0.9)
MONOS PCT: 7 %
NEUTROS PCT: 76 %
Neutro Abs: 4.6 10*3/uL (ref 1.5–6.5)
Platelet Count: 209 10*3/uL (ref 140–400)
RBC: 4.31 MIL/uL (ref 4.20–5.82)
RDW: 15.4 % — AB (ref 11.0–14.6)
WBC Count: 6.1 10*3/uL (ref 4.0–10.3)

## 2017-11-13 LAB — CMP (CANCER CENTER ONLY)
ALT: 9 U/L (ref 0–55)
AST: 20 U/L (ref 5–34)
Albumin: 3.5 g/dL (ref 3.5–5.0)
Alkaline Phosphatase: 81 U/L (ref 40–150)
Anion gap: 10 (ref 3–11)
BILIRUBIN TOTAL: 0.3 mg/dL (ref 0.2–1.2)
BUN: 13 mg/dL (ref 7–26)
CALCIUM: 9.3 mg/dL (ref 8.4–10.4)
CHLORIDE: 102 mmol/L (ref 98–109)
CO2: 30 mmol/L — AB (ref 22–29)
CREATININE: 0.91 mg/dL (ref 0.70–1.30)
GLUCOSE: 103 mg/dL (ref 70–140)
Potassium: 3.4 mmol/L — ABNORMAL LOW (ref 3.5–5.1)
Sodium: 142 mmol/L (ref 136–145)
Total Protein: 7 g/dL (ref 6.4–8.3)

## 2017-11-13 MED ORDER — LEUPROLIDE ACETATE (4 MONTH) 30 MG IM KIT
30.0000 mg | PACK | Freq: Once | INTRAMUSCULAR | Status: AC
  Administered 2017-11-13: 30 mg via INTRAMUSCULAR
  Filled 2017-11-13: qty 30

## 2017-11-13 MED ORDER — DENOSUMAB 120 MG/1.7ML ~~LOC~~ SOLN
120.0000 mg | Freq: Once | SUBCUTANEOUS | Status: AC
  Administered 2017-11-13: 120 mg via SUBCUTANEOUS

## 2017-11-13 MED ORDER — DENOSUMAB 120 MG/1.7ML ~~LOC~~ SOLN
SUBCUTANEOUS | Status: AC
  Filled 2017-11-13: qty 1.7

## 2017-11-13 NOTE — Telephone Encounter (Signed)
Appointments scheduled AVS/Calendar printed per 5/24 los °

## 2017-11-13 NOTE — Patient Instructions (Signed)

## 2017-11-14 ENCOUNTER — Encounter: Payer: Self-pay | Admitting: Student

## 2017-11-14 LAB — PROSTATE-SPECIFIC AG, SERUM (LABCORP): Prostate Specific Ag, Serum: 32.2 ng/mL — ABNORMAL HIGH (ref 0.0–4.0)

## 2017-11-19 ENCOUNTER — Other Ambulatory Visit: Payer: Self-pay | Admitting: Oncology

## 2017-11-19 DIAGNOSIS — C61 Malignant neoplasm of prostate: Secondary | ICD-10-CM

## 2017-12-03 ENCOUNTER — Encounter

## 2018-01-08 ENCOUNTER — Inpatient Hospital Stay (HOSPITAL_BASED_OUTPATIENT_CLINIC_OR_DEPARTMENT_OTHER): Payer: Medicare Other | Admitting: Oncology

## 2018-01-08 ENCOUNTER — Inpatient Hospital Stay: Payer: Medicare Other

## 2018-01-08 ENCOUNTER — Inpatient Hospital Stay: Payer: Medicare Other | Attending: Oncology

## 2018-01-08 ENCOUNTER — Telehealth: Payer: Self-pay | Admitting: Oncology

## 2018-01-08 VITALS — BP 152/83 | HR 67 | Temp 98.6°F | Resp 17 | Ht 65.0 in | Wt 170.4 lb

## 2018-01-08 DIAGNOSIS — C7951 Secondary malignant neoplasm of bone: Secondary | ICD-10-CM | POA: Insufficient documentation

## 2018-01-08 DIAGNOSIS — C61 Malignant neoplasm of prostate: Secondary | ICD-10-CM

## 2018-01-08 DIAGNOSIS — E291 Testicular hypofunction: Secondary | ICD-10-CM

## 2018-01-08 NOTE — Telephone Encounter (Signed)
Scheduled appt per 7/19 los - per patient - no print out needed - pt aware of apt.

## 2018-01-08 NOTE — Progress Notes (Signed)
Hematology and Oncology Follow Up Visit  [name] Mathews [id number] 04-10-1946 72 y.o. 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  He was diagnosed in 2001 with a Gleason score 4+ 5 = 9.      Prior Therapy: 1. He was started on Lupron after attempted prostatectomy in 2001.  He was found to have advanced disease.  2. 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 He developed bony metastasis and PSA up to 21.  4. He is status post radiation therapy to the prostate fossa completed in December 2018. He received 52.5 gr 20 fractions.  Current therapy:  Zytiga 1000 mg po daily started in 09/2011 with prednisone 5 mg daily. Xgeva 120 mg started on 10/31/2011.  This is given every 4-6 weeks. Lupron 30 mg every 4 months.    Interim History: [name] is here for a follow-up visit.  Since the last visit, he reports no changes or new recent complaints.  He continues to take Zytiga without any complications.  He denies any nausea, abdominal distention or leg edema.  He denies any excessive fatigue or tiredness.  He continues to be active and attends to activities of daily living including fishing regularly.  He denies any back pain, hip pain or pathological fractures.  He had recent dental surgery and has fully recovered at this time.  He does not report any headaches, blurry vision, syncope or seizures.  He denies any alteration in mental status or confusion.  He does not report any fevers or chills or sweats.   He denied any chest pain, palpitation, orthopnea or leg edema. He does not report any cough, wheezing or hemoptysis. He denies any nausea, vomiting or abdominal pain.  He denies any early satiety or abdominal distention.  He denied any hematochezia or melena.  He does not report any frequency urgency or hesitancy. He denied any skin rashes or lesions. He denied any  lymphadenopathy or petechiae.  He denies any bleeding or clotting tendencies.  Remaining review of system is negative.  Medications: I have reviewed the patient's current medications. Current Outpatient Medications  Medication Sig Dispense Refill  . abiraterone acetate (ZYTIGA) 250 MG tablet TAKE 4 TABLETS BY MOUTH ONCE DAILY AS DIRECTED.  TAKE 1 HOUR BEFORE OR 2 HOURS AFTER A MEAL 120 tablet 11  . aspirin EC 81 MG tablet Take 1 tablet (81 mg total) by mouth daily. 90 tablet 3  . atorvastatin (LIPITOR) 20 MG tablet Take 1 tablet (20 mg total) by mouth daily. 90 tablet 3  . denosumab (XGEVA) 120 MG/1.7ML SOLN Inject 120 mg into the skin every 30 (thirty) days.    . DULoxetine (CYMBALTA) 60 MG capsule Take 1 capsule (60 mg total) by mouth daily. 90 capsule 2  . EPIPEN 2-PAK 0.3 MG/0.3ML SOAJ injection Reported on 01/04/2016    . hydrochlorothiazide (HYDRODIURIL) 25 MG tablet Take 1 tablet (25 mg total) by mouth daily. 90 tablet 3  . HYDROmorphone (DILAUDID) 2 MG tablet Take 1 tablet (2 mg total) by mouth every 4 (four) hours as needed for severe pain. 60 tablet 0  . leuprolide (LUPRON) 30 MG injection Inject 30 mg into the muscle every 4 (four) months.    . predniSONE (DELTASONE) 5 MG tablet TAKE 1 TABLET (5 MG TOTAL) BY MOUTH 2 (TWO) TIMES DAILY. 90 tablet 1  . rOPINIRole (REQUIP) 2 MG tablet Take 1 tablet (2  mg total) by mouth at bedtime. 90 tablet 3  . SUMAtriptan (IMITREX) 50 MG tablet Take 1 tablet (50 mg total) by mouth once for 1 dose. May repeat in 2 hours if headache persists or recurs. Don't take more than 200 mg in a day 10 tablet 0   No current facility-administered medications for this visit.      Allergies:  Allergies  Allergen Reactions  . Bee Venom Anaphylaxis, Shortness Of Breath and Swelling    Tongue swelling.   . Wasp Venom Anaphylaxis, Shortness Of Breath and Swelling    Tongue swelling   . Percocet [Oxycodone-Acetaminophen] Palpitations    His past medical history  was reviewed today and is unchanged.    Physical Exam:  Blood pressure (!) 152/83, pulse 67, temperature 98.6 F (37 C), temperature source Oral, resp. rate 17, height 5\' 5"  (1.651 m), weight 170 lb 6.4 oz (77.3 kg), SpO2 98 %.   ECOG: 0 General appearance: Alert, awake gentleman without distress. Head: Normal cephalic without normalities. Oropharynx: No oral ulcers or thrush. Eyes: Sclera anicteric. Lymph nodes: No lymphadenopathy noted any cervical, axillary or supraclavicular nodes noted. Heart: Regular rate with S1 and S2.  No murmurs or gallops. Lung: clear without any rhonchi, wheezes or dullness to percussion. Abdomen: Soft, without any rebound or guarding.  No shifting dullness or ascites. Musculoskeletal: No joint deformity or effusion. Neurological: Intact motor, sensory without any deficits. Skin: No petechia or ecchymosis.  No skin rashes or lesions.   Lab Results: Lab Results  Component Value Date   WBC 6.1 11/13/2017   HGB 11.3 (L) 11/13/2017   HCT 36.1 (L) 11/13/2017   MCV 83.8 11/13/2017   PLT 209 11/13/2017     Chemistry      Component Value Date/Time   NA 142 11/13/2017 1214   NA 141 05/20/2017 1425   K 3.4 (L) 11/13/2017 1214   K 4.3 05/20/2017 1425   CL 102 11/13/2017 1214   CL 105 12/08/2012 1504   CO2 30 (H) 11/13/2017 1214   CO2 31 (H) 05/20/2017 1425   BUN 13 11/13/2017 1214   BUN 17.8 05/20/2017 1425   CREATININE 0.91 11/13/2017 1214   CREATININE 0.9 05/20/2017 1425      Component Value Date/Time   CALCIUM 9.3 11/13/2017 1214   CALCIUM 9.4 05/20/2017 1425   ALKPHOS 81 11/13/2017 1214   ALKPHOS 72 05/20/2017 1425   AST 20 11/13/2017 1214   AST 15 05/20/2017 1425   ALT 9 11/13/2017 1214   ALT <6 05/20/2017 1425   BILITOT 0.3 11/13/2017 1214   BILITOT 0.25 05/20/2017 1425       Results for [name], Mathews (MRN [id number]) as of 01/08/2018 10:08  Ref. Range 10/02/2017 13:10 11/13/2017 12:14  Prostate Specific Ag, Serum Latest Ref  Range: 0.0 - 4.0 ng/mL 33.4 (H) 32.2 (H)      Impression and Plan:   72 year old man with:    1.  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    He continues to be on Zytiga without any complications related to this therapy.  He has tolerated reasonably well with excellent response to PSA.  His PSA remains under control without any symptoms at this time.  Risks and benefits of continuing this therapy versus switching to alternative options were reviewed.  Given his overall excellent performance status, lack of symptoms and stable PSA plan is to continue with the same dose and schedule.  Alternative therapy may be  needed in the future if he develops rise in his PSA and worsening metastatic disease.  Repeat imaging studies will be done in the next 4 months.  2. Bone directed therapy: He underwent dental surgery recently and Delton See will be deferred for 2 more months until he is fully healed.   3. Androgen deprivation: 6 and benefits of continuing Lupron was reviewed today is agreeable to continue.  His next Lupron will be in September 2019.  4.  Back pain: No pain reported at this time.  This appears to have resolved.  5. Followup: In 2 months to follow his progress..  15  minutes was spent with the patient face-to-face today.  More than 50% of time was dedicated to patient counseling, education, and discussing the natural course of this disease and reviewing future treatment options.   Zola Button, MD 7/19/201910:08 AM

## 2018-01-09 LAB — PROSTATE-SPECIFIC AG, SERUM (LABCORP): PROSTATE SPECIFIC AG, SERUM: 31.3 ng/mL — AB (ref 0.0–4.0)

## 2018-01-11 ENCOUNTER — Other Ambulatory Visit: Payer: Self-pay

## 2018-01-11 DIAGNOSIS — E785 Hyperlipidemia, unspecified: Secondary | ICD-10-CM

## 2018-01-12 MED ORDER — ATORVASTATIN CALCIUM 20 MG PO TABS: 20.0000 mg | ORAL_TABLET | Freq: Every day | ORAL | 3 refills | Status: DC

## 2018-02-12 ENCOUNTER — Other Ambulatory Visit: Payer: Self-pay | Admitting: Oncology

## 2018-02-12 DIAGNOSIS — C61 Malignant neoplasm of prostate: Secondary | ICD-10-CM

## 2018-03-16 ENCOUNTER — Other Ambulatory Visit: Payer: Self-pay | Admitting: *Deleted

## 2018-03-16 ENCOUNTER — Inpatient Hospital Stay: Payer: Medicare Other | Attending: Oncology

## 2018-03-16 ENCOUNTER — Inpatient Hospital Stay (HOSPITAL_BASED_OUTPATIENT_CLINIC_OR_DEPARTMENT_OTHER): Payer: Medicare Other | Admitting: Oncology

## 2018-03-16 ENCOUNTER — Telehealth: Payer: Self-pay | Admitting: Oncology

## 2018-03-16 ENCOUNTER — Inpatient Hospital Stay: Payer: Medicare Other

## 2018-03-16 VITALS — BP 125/75 | HR 63 | Temp 98.4°F | Resp 17 | Ht 65.0 in | Wt 168.6 lb

## 2018-03-16 DIAGNOSIS — C7951 Secondary malignant neoplasm of bone: Secondary | ICD-10-CM | POA: Diagnosis present

## 2018-03-16 DIAGNOSIS — E291 Testicular hypofunction: Secondary | ICD-10-CM

## 2018-03-16 DIAGNOSIS — Z5111 Encounter for antineoplastic chemotherapy: Secondary | ICD-10-CM | POA: Insufficient documentation

## 2018-03-16 DIAGNOSIS — Z79899 Other long term (current) drug therapy: Secondary | ICD-10-CM | POA: Diagnosis not present

## 2018-03-16 DIAGNOSIS — C61 Malignant neoplasm of prostate: Secondary | ICD-10-CM

## 2018-03-16 DIAGNOSIS — I10 Essential (primary) hypertension: Secondary | ICD-10-CM

## 2018-03-16 LAB — CMP (CANCER CENTER ONLY)
ALT: 10 U/L (ref 0–44)
AST: 17 U/L (ref 15–41)
Albumin: 3.3 g/dL — ABNORMAL LOW (ref 3.5–5.0)
Alkaline Phosphatase: 95 U/L (ref 38–126)
Anion gap: 12 (ref 5–15)
BILIRUBIN TOTAL: 0.4 mg/dL (ref 0.3–1.2)
BUN: 19 mg/dL (ref 8–23)
CALCIUM: 9.5 mg/dL (ref 8.9–10.3)
CO2: 31 mmol/L (ref 22–32)
CREATININE: 1.09 mg/dL (ref 0.61–1.24)
Chloride: 101 mmol/L (ref 98–111)
GFR, Est AFR Am: >60 mL/min (ref >60)
Glucose, Bld: 113 mg/dL — ABNORMAL HIGH (ref 70–99)
Potassium: 3.3 mmol/L — ABNORMAL LOW (ref 3.5–5.1)
Sodium: 144 mmol/L (ref 135–145)
TOTAL PROTEIN: 7.3 g/dL (ref 6.5–8.1)

## 2018-03-16 LAB — CBC WITH DIFFERENTIAL (CANCER CENTER ONLY)
BASOS ABS: 0 10*3/uL (ref 0.0–0.1)
BASOS PCT: 0 %
EOS ABS: 0.1 10*3/uL (ref 0.0–0.5)
Eosinophils Relative: 1 %
HCT: 35.1 % — ABNORMAL LOW (ref 38.4–49.9)
Hemoglobin: 11 g/dL — ABNORMAL LOW (ref 13.0–17.1)
Lymphocytes Relative: 15 %
Lymphs Abs: 1.2 10*3/uL (ref 0.9–3.3)
MCH: 26 pg — AB (ref 27.2–33.4)
MCHC: 31.3 g/dL — ABNORMAL LOW (ref 32.0–36.0)
MCV: 83 fL (ref 79.3–98.0)
Monocytes Absolute: 0.5 10*3/uL (ref 0.1–0.9)
Monocytes Relative: 6 %
Neutro Abs: 6 10*3/uL (ref 1.5–6.5)
Neutrophils Relative %: 78 %
PLATELETS: 245 10*3/uL (ref 140–400)
RBC: 4.23 MIL/uL (ref 4.20–5.82)
RDW: 15.5 % — ABNORMAL HIGH (ref 11.0–14.6)
WBC: 7.7 10*3/uL (ref 4.0–10.3)

## 2018-03-16 MED ORDER — LEUPROLIDE ACETATE (4 MONTH) 30 MG IM KIT
30.0000 mg | PACK | Freq: Once | INTRAMUSCULAR | Status: AC
  Administered 2018-03-16: 30 mg via INTRAMUSCULAR
  Filled 2018-03-16: qty 30

## 2018-03-16 MED ORDER — DENOSUMAB 120 MG/1.7ML ~~LOC~~ SOLN
120.0000 mg | Freq: Once | SUBCUTANEOUS | Status: AC
  Administered 2018-03-16: 120 mg via SUBCUTANEOUS

## 2018-03-16 MED ORDER — ABIRATERONE ACETATE 250 MG PO TABS: ORAL_TABLET | ORAL | 11 refills | Status: DC

## 2018-03-16 NOTE — Telephone Encounter (Signed)
Appts scheduled avs/calendar printed/ contrast provided per 9/24 los

## 2018-03-16 NOTE — Progress Notes (Signed)
Hematology and Oncology Follow Up Visit  [name] Mathews [id number] 09-25-1945 72 y.o. 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  He has currently castration-resistant disease.   Prior Therapy: 1. He was started on Lupron after attempted prostatectomy in 2001.  He was found to have advanced disease.  2. 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 He developed bony metastasis and PSA up to 21.  4. He is status post radiation therapy to the prostate fossa completed in December 2018. He received 52.5 gr 20 fractions.  Current therapy:  Zytiga 1000 mg po daily started in 09/2011 with prednisone 5 mg daily. Xgeva 120 mg started on 10/31/2011.   Lupron 30 mg every 4 months.    Interim History: [name] is here for a follow-up visit.  Since last visit, he continues to enjoy excellent quality of life and remains asymptomatic.  He denies any worsening back pain or pelvic discomfort.  He denies any urination issues. His performance status and activity level remain excellent.   He continues to take Zytiga without any new side effects.  He denies any worsening edema, dyspepsia or fatigue.  He has no difficulties obtaining this medication taken on a regular basis.   He does not report any headaches, blurry vision, syncope or seizures.  He denies any dizziness or lethargy.  He does not report any fevers or chills or sweats.   He denied any chest pain, palpitation, orthopnea or leg edema. He does not report any cough, wheezing or hemoptysis. He denies any nausea, vomiting or abdominal pain.  He denies any changes in bowel habits.  He denied any hematochezia or melena.  He does not report any frequency urgency or hesitancy. He denied any skin rashes or lesions. He denied any bleeding or clotting tendency.  He denies any lymphadenopathy or bruising.   Does not report any mood changes.  Remaining review of system is negative.  Medications: I have reviewed the patient's current medications. Current Outpatient Medications  Medication Sig Dispense Refill  . abiraterone acetate (ZYTIGA) 250 MG tablet TAKE 4 TABLETS BY MOUTH ONCE DAILY AS DIRECTED.  TAKE 1 HOUR BEFORE OR 2 HOURS AFTER A MEAL 120 tablet 11  . aspirin EC 81 MG tablet Take 1 tablet (81 mg total) by mouth daily. 90 tablet 3  . atorvastatin (LIPITOR) 20 MG tablet Take 1 tablet (20 mg total) by mouth daily. 90 tablet 3  . denosumab (XGEVA) 120 MG/1.7ML SOLN Inject 120 mg into the skin every 30 (thirty) days.    . DULoxetine (CYMBALTA) 60 MG capsule Take 1 capsule (60 mg total) by mouth daily. 90 capsule 2  . EPIPEN 2-PAK 0.3 MG/0.3ML SOAJ injection Reported on 01/04/2016    . hydrochlorothiazide (HYDRODIURIL) 25 MG tablet Take 1 tablet (25 mg total) by mouth daily. 90 tablet 3  . HYDROmorphone (DILAUDID) 2 MG tablet Take 1 tablet (2 mg total) by mouth every 4 (four) hours as needed for severe pain. 60 tablet 0  . leuprolide (LUPRON) 30 MG injection Inject 30 mg into the muscle every 4 (four) months.    . predniSONE (DELTASONE) 5 MG tablet TAKE 1 TABLET (5 MG TOTAL) BY MOUTH 2 (TWO) TIMES DAILY. 90 tablet 3  . rOPINIRole (REQUIP) 2 MG tablet Take 1 tablet (2 mg total) by mouth at bedtime. 90 tablet 3  .  SUMAtriptan (IMITREX) 50 MG tablet Take 1 tablet (50 mg total) by mouth once for 1 dose. May repeat in 2 hours if headache persists or recurs. Don't take more than 200 mg in a day 10 tablet 0   No current facility-administered medications for this visit.      Allergies:  Allergies  Allergen Reactions  . Bee Venom Anaphylaxis, Shortness Of Breath and Swelling    Tongue swelling.   . Wasp Venom Anaphylaxis, Shortness Of Breath and Swelling    Tongue swelling   . Percocet [Oxycodone-Acetaminophen] Palpitations    His past medical history was reviewed today and is  unchanged.    Physical Exam:  Blood pressure 125/75, pulse 63, temperature 98.4 F (36.9 C), temperature source Oral, resp. rate 17, height 5\' 5"  (1.651 m), weight 168 lb 9.6 oz (76.5 kg), SpO2 100 %.    ECOG: 0   General appearance: Comfortable appearing without any discomfort Head: Normocephalic without any trauma Oropharynx: Mucous membranes are moist and pink without any thrush or ulcers. Eyes: Pupils are equal and round reactive to light. Lymph nodes: No cervical, supraclavicular, inguinal or axillary lymphadenopathy.   Heart:regular rate and rhythm.  S1 and S2 without leg edema. Lung: Clear without any rhonchi or wheezes.  No dullness to percussion. Abdomin: Soft, nontender, nondistended with good bowel sounds.  No hepatosplenomegaly. Musculoskeletal: No joint deformity or effusion.  Full range of motion noted. Neurological: No deficits noted on motor, sensory and deep tendon reflex exam. Skin: No petechial rash or dryness.  Appeared moist.     Lab Results: Lab Results  Component Value Date   WBC 6.1 11/13/2017   HGB 11.3 (L) 11/13/2017   HCT 36.1 (L) 11/13/2017   MCV 83.8 11/13/2017   PLT 209 11/13/2017     Chemistry      Component Value Date/Time   NA 142 11/13/2017 1214   NA 141 05/20/2017 1425   K 3.4 (L) 11/13/2017 1214   K 4.3 05/20/2017 1425   CL 102 11/13/2017 1214   CL 105 12/08/2012 1504   CO2 30 (H) 11/13/2017 1214   CO2 31 (H) 05/20/2017 1425   BUN 13 11/13/2017 1214   BUN 17.8 05/20/2017 1425   CREATININE 0.91 11/13/2017 1214   CREATININE 0.9 05/20/2017 1425      Component Value Date/Time   CALCIUM 9.3 11/13/2017 1214   CALCIUM 9.4 05/20/2017 1425   ALKPHOS 81 11/13/2017 1214   ALKPHOS 72 05/20/2017 1425   AST 20 11/13/2017 1214   AST 15 05/20/2017 1425   ALT 9 11/13/2017 1214   ALT <6 05/20/2017 1425   BILITOT 0.3 11/13/2017 1214   BILITOT 0.25 05/20/2017 1425      Results for DAVIEN, [name] Mathews (MRN [id number]) as of 03/16/2018 09:39   Ref. Range 10/02/2017 13:10 11/13/2017 12:14 01/08/2018 09:59  Prostate Specific Ag, Serum Latest Ref Range: 0.0 - 4.0 ng/mL 33.4 (H) 32.2 (H) 31.3 (H)        Impression and Plan:   72 year old man with:    1.  Advanced prostate cancer that is currently castration-resistance diagnosed in 2013 with known disease to the bone and lymphadenopathy.   He remains on Zytiga with excellent disease control at this time and his PSA that has been stable.  The natural course of this disease was reviewed again and treatment options were reiterated.  I recommended continuing Zytiga for the time being and repeat imaging studies for staging purposes.  If he continues to have limited  disease progression, will continue on the same dose and schedule.  Systemic chemotherapy may be needed if he has symptomatic progression.  He is agreeable to proceed at this time.  2. Bone directed therapy: He is on Xgeva every 2 months.  He did not receive it 2 months ago and   3. Androgen deprivation: Continues to be on Lupron and will be due for injection today.  Complication associated with this therapy was reviewed and is agreeable to continue.  4.  Back pain: None reported at this time and appears to have resolved since the last few months.  5.  Hypertension: His blood pressure continues to be within normal range on Zytiga.  We will monitor this periodically.  6. Followup: In 2 months to follow his progress.   25  minutes was spent with the patient face-to-face today.  More than 50% of time was dedicated to reviewing the natural course of his disease, treatment options and coordinating plan of care.   Zola Button, MD 9/24/20199:39 AM

## 2018-03-17 LAB — PROSTATE-SPECIFIC AG, SERUM (LABCORP): Prostate Specific Ag, Serum: 39.7 ng/mL — ABNORMAL HIGH (ref 0.0–4.0)

## 2018-03-24 ENCOUNTER — Telehealth: Payer: Self-pay

## 2018-03-24 NOTE — Telephone Encounter (Signed)
Oral Oncology Patient Advocate Encounter  Mr. Breeden brought in a renewal application for his Zytiga that [employer] and [place] mailed to him along with his 2018 tax return. I completed the form and had the doctor sign it.  I faxed the application to [facility] and [facility] on 03/24/18.  Will continue to update   [place] [facility] [place] Phone [phone] Fax [phone]

## 2018-04-03 ENCOUNTER — Other Ambulatory Visit: Payer: Self-pay

## 2018-04-03 ENCOUNTER — Encounter (HOSPITAL_COMMUNITY): Payer: Self-pay

## 2018-04-03 ENCOUNTER — Emergency Department (HOSPITAL_COMMUNITY)
Admission: EM | Admit: 2018-04-03 | Discharge: 2018-04-03 | Disposition: A | Discharge status: Home / Self Care | Payer: Medicare Other | Attending: Emergency Medicine | Admitting: Emergency Medicine

## 2018-04-03 DIAGNOSIS — Z7982 Long term (current) use of aspirin: Secondary | ICD-10-CM | POA: Insufficient documentation

## 2018-04-03 DIAGNOSIS — Z91030 Bee allergy status: Secondary | ICD-10-CM | POA: Insufficient documentation

## 2018-04-03 DIAGNOSIS — T7840XA Allergy, unspecified, initial encounter: Secondary | ICD-10-CM

## 2018-04-03 DIAGNOSIS — Z87891 Personal history of nicotine dependence: Secondary | ICD-10-CM | POA: Diagnosis not present

## 2018-04-03 DIAGNOSIS — I10 Essential (primary) hypertension: Secondary | ICD-10-CM | POA: Insufficient documentation

## 2018-04-03 DIAGNOSIS — Z8546 Personal history of malignant neoplasm of prostate: Secondary | ICD-10-CM | POA: Diagnosis not present

## 2018-04-03 DIAGNOSIS — T63441A Toxic effect of venom of bees, accidental (unintentional), initial encounter: Secondary | ICD-10-CM | POA: Diagnosis not present

## 2018-04-03 DIAGNOSIS — Z79899 Other long term (current) drug therapy: Secondary | ICD-10-CM | POA: Insufficient documentation

## 2018-04-03 MED ORDER — FAMOTIDINE IN NACL 20-0.9 MG/50ML-% IV SOLN
20.0000 mg | Freq: Once | INTRAVENOUS | Status: AC
  Administered 2018-04-03: 20 mg via INTRAVENOUS
  Filled 2018-04-03: qty 50

## 2018-04-03 MED ORDER — DIPHENHYDRAMINE HCL 50 MG/ML IJ SOLN
25.0000 mg | Freq: Once | INTRAMUSCULAR | Status: AC
  Administered 2018-04-03: 25 mg via INTRAVENOUS
  Filled 2018-04-03: qty 1

## 2018-04-03 MED ORDER — METHYLPREDNISOLONE SODIUM SUCC 125 MG IJ SOLR
125.0000 mg | Freq: Once | INTRAMUSCULAR | Status: AC
  Administered 2018-04-03: 125 mg via INTRAVENOUS
  Filled 2018-04-03: qty 2

## 2018-04-03 MED ORDER — PREDNISONE 10 MG PO TABS: 20.0000 mg | ORAL_TABLET | Freq: Every day | ORAL | 0 refills | Status: AC

## 2018-04-03 MED ORDER — EPINEPHRINE 0.3 MG/0.3ML IJ SOAJ: 0.3000 mg | Freq: Once | INTRAMUSCULAR | 0 refills | Status: AC

## 2018-04-03 NOTE — ED Notes (Signed)
ED Provider at bedside. 

## 2018-04-03 NOTE — ED Provider Notes (Signed)
[facility] DEPT Provider Note   CSN: [id number] Arrival date & time: 04/03/18  1121     History   Chief Complaint Chief Complaint  Patient presents with  . Allergic Reaction    HPI GEVIN PEREA is a 72 y.o. male.  HPI    72 year old male with history below presents with concern for a allergic reaction after a bee sting today.  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  Reports that he felt a sting to his right flank, and was able to capture the be between his shirt, and confirm that he had been stung.  Reports he then developed itching all over and sensation of shortness of breath.  Given he did not have his EpiPen, he came to the emergency department for further care.  Reports that initially it had shortness of breath, but at this point he denies any shortness of breath, throat swelling, tongue swelling, nausea, vomiting, abdominal pain, diarrhea or lightheadedness.  Reports he continues to have sensation of itching.  Reports anaphylactic reactions in the past did include sensation of throat swelling, but does not have that at this time.  He has not yet had anything for his symptoms.   Past Medical History:  Diagnosis Date  . Hyperlipidemia   . Hypertension   . Prostate cancer ([place])   . Restless leg syndrome     Patient Active Problem List   Diagnosis Date Noted  . History of cluster headache 08/12/2017  . Neuropathic pain 02/09/2017  . Routine adult health maintenance 01/08/2017  . Essential hypertension 01/08/2017  . Hyperlipidemia 01/08/2017  . Cervical lymphadenopathy 01/08/2017  . Back pain 09/08/2014  . 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 prostectomy  2001        Home Medications    Prior to Admission medications   Medication Sig Start Date End Date Taking? Authorizing Provider  abiraterone acetate (ZYTIGA) 250 MG  tablet TAKE 4 TABLETS BY MOUTH ONCE DAILY AS DIRECTED.  TAKE 1 HOUR BEFORE OR 2 HOURS AFTER A MEAL 03/16/18  Yes [name], MD  aspirin EC 81 MG tablet Take 1 tablet (81 mg total) by mouth daily. 02/09/17  Yes Mercy Riding, MD  atorvastatin (LIPITOR) 20 MG tablet Take 1 tablet (20 mg total) by mouth daily. 01/12/18  Yes Guadalupe Dawn, MD  denosumab (XGEVA) 120 MG/1.7ML SOLN Inject 120 mg into the skin every 30 (thirty) days.   Yes [provider]  DULoxetine (CYMBALTA) 60 MG capsule Take 1 capsule (60 mg total) by mouth daily. 09/08/17  Yes Mercy Riding, MD  EPIPEN 2-PAK 0.3 MG/0.3ML SOAJ injection Reported on 01/04/2016 10/27/13  Yes [provider]  hydrochlorothiazide (HYDRODIURIL) 25 MG tablet Take 1 tablet (25 mg total) by mouth daily. 09/08/17  Yes Mercy Riding, MD  leuprolide (LUPRON) 30 MG injection Inject 30 mg into the muscle every 4 (four) months.   Yes [provider]  predniSONE (DELTASONE) 5 MG tablet TAKE 1 TABLET (5 MG TOTAL) BY MOUTH 2 (TWO) TIMES DAILY. 02/12/18  Yes [name], MD  rOPINIRole (REQUIP) 2 MG tablet Take 1 tablet (2 mg total) by mouth at bedtime. 05/13/17  Yes Mercy Riding, MD  EPINEPHrine 0.3 mg/0.3 mL IJ SOAJ injection Inject 0.3 mLs (0.3 mg total) into the muscle once for 1 dose. 04/03/18 04/03/18  Gareth Morgan, MD  HYDROmorphone (DILAUDID) 2 MG tablet Take 1 tablet (2 mg total) by mouth every 4 (four) hours as needed for severe pain. Patient not taking: Reported on 04/03/2018 05/20/17   [name], MD  predniSONE (DELTASONE) 10 MG tablet Take 2 tablets (20 mg total) by mouth daily for 2 days. 04/03/18 04/05/18  Gareth Morgan, MD  SUMAtriptan (IMITREX) 50 MG tablet Take 1 tablet (50 mg total) by mouth once for 1 dose. May repeat in 2 hours if headache persists or recurs. Don't take more than 200 mg in a day Patient not taking: Reported on 04/03/2018 08/12/17 08/12/17  Mercy Riding, MD    Family History Family History   Problem Relation Age of Onset  . Heart attack Mother 42       died of heart attack  . Cervical cancer Sister 55  . Stroke Brother 74    Social History Social History   Tobacco Use  . Smoking status: Former Smoker    Packs/day: 0.25    Years: 6.00    Pack years: 1.50    Start date: 1969    Last attempt to quit: 1975    Years since quitting: 44.8  . Smokeless tobacco: Never Used  Substance Use Topics  . Alcohol use: No  . Drug use: No     Allergies   Bee venom; Wasp venom; and Percocet [oxycodone-acetaminophen]   Review of Systems Review of Systems  Constitutional: Negative for fever.  HENT: Negative for sore throat, trouble swallowing and voice change.   Eyes: Negative for visual disturbance.  Respiratory: Positive for shortness of breath (but now resolved.).   Cardiovascular: Negative for chest pain.  Gastrointestinal: Negative for abdominal pain, diarrhea, nausea and vomiting.  Genitourinary: Negative for difficulty urinating.  Musculoskeletal: Negative for back pain and neck stiffness.  Skin: Positive for rash.  Neurological: Negative for syncope and light-headedness.     Physical Exam Updated Vital Signs BP (!) 148/132   Pulse (!) 59   Temp 98.1 F (36.7 C)   Resp 14   Ht 5\' 5"  (1.651 m)   Wt 76.2 kg   SpO2 100%   BMI 27.96 kg/m   Physical Exam  Constitutional: He is oriented to person, place, and time. He appears well-developed and well-nourished. No distress.  HENT:  Head: Normocephalic and atraumatic.  Eyes: Conjunctivae and EOM are normal.  Neck: Normal range of motion.  Cardiovascular: Normal rate, regular rhythm, normal heart sounds and intact distal pulses. Exam reveals no gallop and no friction rub.  No murmur heard. Pulmonary/Chest: Effort normal and breath sounds normal. No respiratory distress. He has no wheezes. He has no rales.  Abdominal: Soft. He exhibits no distension. There is no tenderness. There is no guarding.    Musculoskeletal: He exhibits no edema.  Neurological: He is alert and oriented to person, place, and time.  Skin: Skin is warm and dry. He is not diaphoretic.  Left flank with wheal 2.5cm  Nursing note and vitals reviewed.    ED Treatments / Results  Labs (all labs ordered are listed, but only abnormal results are displayed) Labs Reviewed - No data to display  EKG None  Radiology No results found.  Procedures Procedures (including critical care time)  Medications Ordered in ED Medications  methylPREDNISolone sodium succinate (SOLU-MEDROL) 125 mg/2 mL injection 125 mg (125 mg Intravenous Given 04/03/18 1214)  diphenhydrAMINE (BENADRYL) injection 25 mg (25 mg Intravenous Given 04/03/18 1213)  famotidine (PEPCID) IVPB 20 mg premix (0 mg  Intravenous Stopped 04/03/18 1338)     Initial Impression / Assessment and Plan / ED Course  I have reviewed the triage vital signs and the nursing notes.  Pertinent labs & imaging results that were available during my care of the patient were reviewed by me and considered in my medical decision making (see chart for details).    72 year old male with history below presents with concern for a allergic reaction after a bee sting today.  While the symptoms patient described that he initially had following today would be concerning for anaphylaxis, time of my evaluation he has no symptoms of shortness of breath, throat swelling, nausea, vomiting, abdominal pain, diarrhea, lightheadedness or signs on exam of throat swelling or wheezing.  At this time he does not present with symptoms of anaphylaxis.  He was given steroids, Benadryl, and famotidine in the emergency department, and observed with no development of anaphylactic symptoms.  Given his history of prior anaphylaxis to bee stings, he was discharged with a prescription for an EpiPen, recommend Benadryl and steroids for 2 days. Patient discharged in stable condition with understanding of reasons to  return.   Final Clinical Impressions(s) / ED Diagnoses   Final diagnoses:  Allergic reaction, initial encounter    ED Discharge Orders         Ordered    predniSONE (DELTASONE) 10 MG tablet  Daily     04/03/18 1356    EPINEPHrine 0.3 mg/0.3 mL IJ SOAJ injection   Once     04/03/18 1358           Gareth Morgan, MD 04/03/18 1416

## 2018-04-03 NOTE — Discharge Instructions (Addendum)
Take benadryl 25mg  every 6 hours for 48 hours for itching/allergic reactions.

## 2018-04-03 NOTE — ED Triage Notes (Signed)
Patient arrives with c/o bee sting that happened approx 25 minutes ago. Patient has known allergy to bees. Welp noted to left flank. Patient with redness to bilateral upper arms. Patient reports itching and labored breathing.

## 2018-05-04 NOTE — Telephone Encounter (Signed)
Oral Oncology Patient Advocate Encounter  I called Wynetta Emery and Wynetta Emery today to follow up on this application. The [employer] representative informed me that the patient will be auto enrolled in Jan. 2020 and will have new approval dates 06/23/18-06/23/19. They will also send a new Medicare Part D attestation form at that time.   I called the patient and gave him this information. He verbalized understanding and great appreciation.  [facility] Phone [phone] Fax [phone]

## 2018-05-10 ENCOUNTER — Other Ambulatory Visit: Payer: Self-pay | Admitting: *Deleted

## 2018-05-10 DIAGNOSIS — M792 Neuralgia and neuritis, unspecified: Secondary | ICD-10-CM

## 2018-05-10 MED ORDER — DULOXETINE HCL 60 MG PO CPEP: 60.0000 mg | ORAL_CAPSULE | Freq: Every day | ORAL | 3 refills | Status: DC

## 2018-05-10 MED ORDER — ROPINIROLE HCL 2 MG PO TABS: 2.0000 mg | ORAL_TABLET | Freq: Every day | ORAL | 3 refills | Status: DC

## 2018-05-11 ENCOUNTER — Encounter (HOSPITAL_COMMUNITY): Payer: Self-pay

## 2018-05-11 ENCOUNTER — Encounter (HOSPITAL_COMMUNITY)
Admission: RE | Admit: 2018-05-11 | Discharge: 2018-05-11 | Disposition: A | Discharge status: Home / Self Care | Payer: Medicare Other | Source: Ambulatory Visit | Attending: Oncology | Admitting: Oncology

## 2018-05-11 ENCOUNTER — Ambulatory Visit (HOSPITAL_COMMUNITY)
Admission: RE | Admit: 2018-05-11 | Discharge: 2018-05-11 | Disposition: A | Discharge status: Home / Self Care | Payer: Medicare Other | Source: Ambulatory Visit | Attending: Oncology | Admitting: Oncology

## 2018-05-11 ENCOUNTER — Inpatient Hospital Stay: Payer: Medicare Other | Attending: Oncology

## 2018-05-11 DIAGNOSIS — C61 Malignant neoplasm of prostate: Secondary | ICD-10-CM | POA: Insufficient documentation

## 2018-05-11 DIAGNOSIS — R918 Other nonspecific abnormal finding of lung field: Secondary | ICD-10-CM | POA: Insufficient documentation

## 2018-05-11 DIAGNOSIS — C7951 Secondary malignant neoplasm of bone: Secondary | ICD-10-CM | POA: Insufficient documentation

## 2018-05-11 LAB — CMP (CANCER CENTER ONLY)
ALK PHOS: 101 U/L (ref 38–126)
ALT: 8 U/L (ref 0–44)
AST: 18 U/L (ref 15–41)
Albumin: 3.1 g/dL — ABNORMAL LOW (ref 3.5–5.0)
Anion gap: 10 (ref 5–15)
BUN: 17 mg/dL (ref 8–23)
CALCIUM: 9.5 mg/dL (ref 8.9–10.3)
CO2: 28 mmol/L (ref 22–32)
CREATININE: 0.96 mg/dL (ref 0.61–1.24)
Chloride: 105 mmol/L (ref 98–111)
GFR, Est AFR Am: >60 mL/min (ref >60)
GFR, Estimated: >60 mL/min (ref >60)
GLUCOSE: 113 mg/dL — AB (ref 70–99)
Potassium: 3.7 mmol/L (ref 3.5–5.1)
SODIUM: 143 mmol/L (ref 135–145)
Total Bilirubin: 0.3 mg/dL (ref 0.3–1.2)
Total Protein: 6.8 g/dL (ref 6.5–8.1)

## 2018-05-11 LAB — CBC WITH DIFFERENTIAL (CANCER CENTER ONLY)
Abs Immature Granulocytes: 0.02 10*3/uL (ref 0.00–0.07)
Basophils Absolute: 0 10*3/uL (ref 0.0–0.1)
Basophils Relative: 0 %
EOS ABS: 0.2 10*3/uL (ref 0.0–0.5)
Eosinophils Relative: 3 %
HEMATOCRIT: 33.5 % — AB (ref 39.0–52.0)
HEMOGLOBIN: 10.5 g/dL — AB (ref 13.0–17.0)
IMMATURE GRANULOCYTES: 0 %
LYMPHS ABS: 1.1 10*3/uL (ref 0.7–4.0)
LYMPHS PCT: 16 %
MCH: 25.9 pg — ABNORMAL LOW (ref 26.0–34.0)
MCHC: 31.3 g/dL (ref 30.0–36.0)
MCV: 82.7 fL (ref 80.0–100.0)
MONOS PCT: 6 %
Monocytes Absolute: 0.4 10*3/uL (ref 0.1–1.0)
NEUTROS PCT: 75 %
Neutro Abs: 5.1 10*3/uL (ref 1.7–7.7)
Platelet Count: 252 10*3/uL (ref 150–400)
RBC: 4.05 MIL/uL — ABNORMAL LOW (ref 4.22–5.81)
RDW: 15.3 % (ref 11.5–15.5)
WBC Count: 6.9 10*3/uL (ref 4.0–10.5)
nRBC: 0 % (ref 0.0–0.2)

## 2018-05-11 MED ORDER — IOHEXOL 300 MG/ML  SOLN
100.0000 mL | Freq: Once | INTRAMUSCULAR | Status: AC | PRN
  Administered 2018-05-11: 100 mL via INTRAVENOUS

## 2018-05-11 MED ORDER — SODIUM CHLORIDE (PF) 0.9 % IJ SOLN
INTRAMUSCULAR | Status: AC
  Filled 2018-05-11: qty 50

## 2018-05-11 MED ORDER — TECHNETIUM TC 99M MEDRONATE IV KIT: 22.0000 | PACK | Freq: Once | INTRAVENOUS | Status: DC | PRN

## 2018-05-12 LAB — PROSTATE-SPECIFIC AG, SERUM (LABCORP): PROSTATE SPECIFIC AG, SERUM: 40.9 ng/mL — AB (ref 0.0–4.0)

## 2018-05-13 ENCOUNTER — Inpatient Hospital Stay: Payer: Medicare Other | Admitting: Oncology

## 2018-05-13 ENCOUNTER — Inpatient Hospital Stay: Payer: Medicare Other

## 2018-05-19 ENCOUNTER — Other Ambulatory Visit: Payer: Self-pay | Admitting: Oncology

## 2018-05-19 DIAGNOSIS — C61 Malignant neoplasm of prostate: Secondary | ICD-10-CM

## 2018-06-29 ENCOUNTER — Telehealth: Payer: Self-pay | Admitting: Oncology

## 2018-06-29 NOTE — Telephone Encounter (Signed)
Scheduled appt per 1/7 sch message - pt is aware of appt date and time   

## 2018-07-01 ENCOUNTER — Telehealth: Payer: Self-pay | Admitting: Oncology

## 2018-07-01 ENCOUNTER — Inpatient Hospital Stay (HOSPITAL_BASED_OUTPATIENT_CLINIC_OR_DEPARTMENT_OTHER): Payer: Medicare Other | Admitting: Oncology

## 2018-07-01 ENCOUNTER — Inpatient Hospital Stay: Payer: Medicare Other

## 2018-07-01 ENCOUNTER — Inpatient Hospital Stay: Payer: Medicare Other | Attending: Oncology

## 2018-07-01 VITALS — BP 128/74 | HR 72 | Temp 98.0°F | Resp 17 | Ht 65.0 in | Wt 169.3 lb

## 2018-07-01 DIAGNOSIS — C61 Malignant neoplasm of prostate: Secondary | ICD-10-CM

## 2018-07-01 DIAGNOSIS — M545 Low back pain: Secondary | ICD-10-CM | POA: Diagnosis not present

## 2018-07-01 DIAGNOSIS — E291 Testicular hypofunction: Secondary | ICD-10-CM

## 2018-07-01 DIAGNOSIS — I10 Essential (primary) hypertension: Secondary | ICD-10-CM | POA: Diagnosis not present

## 2018-07-01 DIAGNOSIS — Z79899 Other long term (current) drug therapy: Secondary | ICD-10-CM | POA: Insufficient documentation

## 2018-07-01 DIAGNOSIS — Z5111 Encounter for antineoplastic chemotherapy: Secondary | ICD-10-CM | POA: Insufficient documentation

## 2018-07-01 DIAGNOSIS — C7951 Secondary malignant neoplasm of bone: Secondary | ICD-10-CM | POA: Insufficient documentation

## 2018-07-01 LAB — CBC WITH DIFFERENTIAL (CANCER CENTER ONLY)
Abs Immature Granulocytes: 0.01 10*3/uL (ref 0.00–0.07)
BASOS ABS: 0 10*3/uL (ref 0.0–0.1)
Basophils Relative: 0 %
EOS ABS: 0.2 10*3/uL (ref 0.0–0.5)
Eosinophils Relative: 3 %
HEMATOCRIT: 34.8 % — AB (ref 39.0–52.0)
Hemoglobin: 10.6 g/dL — ABNORMAL LOW (ref 13.0–17.0)
IMMATURE GRANULOCYTES: 0 %
LYMPHS ABS: 1 10*3/uL (ref 0.7–4.0)
Lymphocytes Relative: 16 %
MCH: 25.4 pg — ABNORMAL LOW (ref 26.0–34.0)
MCHC: 30.5 g/dL (ref 30.0–36.0)
MCV: 83.3 fL (ref 80.0–100.0)
Monocytes Absolute: 0.6 10*3/uL (ref 0.1–1.0)
Monocytes Relative: 10 %
NEUTROS PCT: 71 %
NRBC: 0 % (ref 0.0–0.2)
Neutro Abs: 4.2 10*3/uL (ref 1.7–7.7)
PLATELETS: 231 10*3/uL (ref 150–400)
RBC: 4.18 MIL/uL — AB (ref 4.22–5.81)
RDW: 15.9 % — AB (ref 11.5–15.5)
WBC: 5.9 10*3/uL (ref 4.0–10.5)

## 2018-07-01 LAB — CMP (CANCER CENTER ONLY)
ALT: 9 U/L (ref 0–44)
ANION GAP: 10 (ref 5–15)
AST: 17 U/L (ref 15–41)
Albumin: 3.1 g/dL — ABNORMAL LOW (ref 3.5–5.0)
Alkaline Phosphatase: 93 U/L (ref 38–126)
BUN: 17 mg/dL (ref 8–23)
CALCIUM: 9.4 mg/dL (ref 8.9–10.3)
CHLORIDE: 103 mmol/L (ref 98–111)
CO2: 30 mmol/L (ref 22–32)
Creatinine: 0.94 mg/dL (ref 0.61–1.24)
GFR, Est AFR Am: >60 mL/min (ref >60)
Glucose, Bld: 103 mg/dL — ABNORMAL HIGH (ref 70–99)
Potassium: 4.4 mmol/L (ref 3.5–5.1)
SODIUM: 143 mmol/L (ref 135–145)
Total Bilirubin: 0.6 mg/dL (ref 0.3–1.2)
Total Protein: 6.6 g/dL (ref 6.5–8.1)

## 2018-07-01 MED ORDER — DENOSUMAB 120 MG/1.7ML ~~LOC~~ SOLN
120.0000 mg | Freq: Once | SUBCUTANEOUS | Status: AC
  Administered 2018-07-01: 120 mg via SUBCUTANEOUS

## 2018-07-01 MED ORDER — LEUPROLIDE ACETATE (4 MONTH) 30 MG IM KIT
30.0000 mg | PACK | Freq: Once | INTRAMUSCULAR | Status: AC
  Administered 2018-07-01: 30 mg via INTRAMUSCULAR
  Filled 2018-07-01: qty 30

## 2018-07-01 NOTE — Progress Notes (Signed)
Hematology and Oncology Follow Up Visit  [name] Mathews [id number] 03-27-1946 73 y.o. 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  He was initially diagnosed in 2001 with a Gleason score of 4+5 = 9.     Prior Therapy: 1. He was started on Lupron after attempted prostatectomy in 2001.  He was found to have advanced disease.  2. 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 He developed bony metastasis and PSA up to 89.  4. He is status post radiation therapy to the prostate fossa completed in December 2018. He received 52.5 gr 20 fractions.  Current therapy:  Zytiga 1000 mg po daily with prednisone 5 mg started in 09/2011.  Xgeva 120 mg started on 10/31/2011.   Lupron 30 mg every 4 months.  Next injection scheduled for January 2020.   Interim History: [name] Mathews returns today for a repeat evaluation.  Since the last visit, he reports no major changes in his health.  Continues to be active and attends to activities of daily living.  He tolerates Zytiga and prednisone without any recent side effects.  He denies any nausea, fatigue or edema.  He does report a lower back discomfort related to excessive exertion and work in the yard in the last 24 hours.  He ambulates without any issues.  Performance status and quality of life remain excellent.   He does not report any headaches, blurry vision, syncope or seizures.  He denies any alteration mental status or confusion.  He does not report any fevers or chills or sweats.   He denied any chest pain, palpitation, orthopnea or leg edema. He does not report any cough, wheezing or hemoptysis. He denies any nausea, vomiting or distention.  He denies any changes in bowel habits.  He does not report any frequency urgency or hesitancy. He denied any skin rashes or lesions. He denied any ecchymosis or petechiae.  He denies any bleeding or clotting tendency.   Does not report any anxiety or depression.  Remaining review of system is negative.  Medications: I have reviewed the patient's current medications. Current Outpatient Medications  Medication Sig Dispense Refill  . aspirin EC 81 MG tablet Take 1 tablet (81 mg total) by mouth daily. 90 tablet 3  . atorvastatin (LIPITOR) 20 MG tablet Take 1 tablet (20 mg total) by mouth daily. 90 tablet 3  . denosumab (XGEVA) 120 MG/1.7ML SOLN Inject 120 mg into the skin every 30 (thirty) days.    . DULoxetine (CYMBALTA) 60 MG capsule Take 1 capsule (60 mg total) by mouth daily. 90 capsule 3  . EPIPEN 2-PAK 0.3 MG/0.3ML SOAJ injection Reported on 01/04/2016    . hydrochlorothiazide (HYDRODIURIL) 25 MG tablet Take 1 tablet (25 mg total) by mouth daily. 90 tablet 3  . HYDROmorphone (DILAUDID) 2 MG tablet Take 1 tablet (2 mg total) by mouth every 4 (four) hours as needed for severe pain. (Patient not taking: Reported on 04/03/2018) 60 tablet 0  . leuprolide (LUPRON) 30 MG injection Inject 30 mg into the muscle every 4 (four) months.    . predniSONE (DELTASONE) 5 MG tablet TAKE 1 TABLET (5 MG TOTAL) BY MOUTH 2 (TWO) TIMES DAILY. 90 tablet 3  . rOPINIRole (REQUIP) 2 MG tablet Take 1 tablet (2 mg total) by mouth at bedtime. 90 tablet 3  . SUMAtriptan (IMITREX) 50 MG tablet Take 1 tablet (50 mg total) by mouth once for 1 dose.  May repeat in 2 hours if headache persists or recurs. Don't take more than 200 mg in a day (Patient not taking: Reported on 04/03/2018) 10 tablet 0  . ZYTIGA 250 MG tablet TAKE 4 TABLETS BY MOUTH ONCE DAILY AS DIRECTED.  TAKE 1 HOUR BEFORE OR 2 HOURS AFTER A MEAL 120 tablet 7   No current facility-administered medications for this visit.      Allergies:  Allergies  Allergen Reactions  . Bee Venom Anaphylaxis, Shortness Of Breath and Swelling    Tongue swelling.   . Wasp Venom Anaphylaxis, Shortness Of Breath and Swelling    Tongue swelling   . Percocet [Oxycodone-Acetaminophen] Palpitations     His past medical history was reviewed today and is unchanged.    Physical Exam:  Blood pressure 128/74, pulse 72, temperature 98 F (36.7 C), temperature source Oral, resp. rate 17, height 5\' 5"  (1.651 m), weight 169 lb 4.8 oz (76.8 kg), SpO2 97 %.    ECOG: 0   General appearance: Alert, awake without any distress. Head: Atraumatic without abnormalities Oropharynx: Without any thrush or ulcers. Eyes: No scleral icterus. Lymph nodes: No lymphadenopathy noted in the cervical, supraclavicular, or axillary nodes Heart:regular rate and rhythm, without any murmurs or gallops.   Lung: Clear to auscultation without any rhonchi, wheezes or dullness to percussion. Abdomin: Soft, nontender without any shifting dullness or ascites. Musculoskeletal: No clubbing or cyanosis. Neurological: No motor or sensory deficits. Skin: No rashes or lesions.      Lab Results: Lab Results  Component Value Date   WBC 5.9 07/01/2018   HGB 10.6 (L) 07/01/2018   HCT 34.8 (L) 07/01/2018   MCV 83.3 07/01/2018   PLT 231 07/01/2018     Chemistry      Component Value Date/Time   NA 143 05/11/2018 1009   NA 141 05/20/2017 1425   K 3.7 05/11/2018 1009   K 4.3 05/20/2017 1425   CL 105 05/11/2018 1009   CL 105 12/08/2012 1504   CO2 28 05/11/2018 1009   CO2 31 (H) 05/20/2017 1425   BUN 17 05/11/2018 1009   BUN 17.8 05/20/2017 1425   CREATININE 0.96 05/11/2018 1009   CREATININE 0.9 05/20/2017 1425      Component Value Date/Time   CALCIUM 9.5 05/11/2018 1009   CALCIUM 9.4 05/20/2017 1425   ALKPHOS 101 05/11/2018 1009   ALKPHOS 72 05/20/2017 1425   AST 18 05/11/2018 1009   AST 15 05/20/2017 1425   ALT 8 05/11/2018 1009   ALT <6 05/20/2017 1425   BILITOT 0.3 05/11/2018 1009   BILITOT 0.25 05/20/2017 1425        Results for EVONTE, PRESTAGE (MRN [id number]) as of 07/01/2018 12:58  Ref. Range 03/16/2018 09:29 05/11/2018 10:09  Prostate Specific Ag, Serum Latest Ref Range: 0.0 - 4.0 ng/mL 39.7  (H) 40.9 (H)       Impression and Plan:   73 year old man with:    1.  Castration-resistant prostate cancer with disease to the bone and lymphadenopathy diagnosed in 2013.   He continues to tolerate Zytiga without any new side effects at this time.  His PSA remains stable and imaging studies from November 2019 showed overall stable disease.  CT scan and bone scan obtained at that time were personally reviewed and discussed with the patient and overall his disease is stable.  Different salvage therapy will be needed in the future including systemic chemotherapy and Xofigo.  These options were discussed today and will be deferred  if he develops symptomatic progression.  2. Bone directed therapy: He remains on Xgeva.  Risks and benefits of continuing this treatment was discussed today.  Long-term complications including osteonecrosis of the jaw and hypocalcemia were reviewed and is agreeable to proceed.   3. Androgen deprivation: He remains on Lupron every 4 months.  Last treatment was in September and will be repeated in January 2020.  4.  Back pain: Appears to be mechanical in nature and related to overexertion.  We will continue to monitor moving forward.  5.  Hypertension: Blood pressure is within normal range and will continue to monitor on Zytiga  6. Followup: In 2 months to follow his progress.   25  minutes was spent with the patient face-to-face today.  More than 50% of time was dedicated to discussing his disease status, imaging studies, laboratory data and discussing future treatment options.    Zola Button, MD 1/9/20201:05 PM

## 2018-07-01 NOTE — Telephone Encounter (Signed)
Printed calendar and avs. °

## 2018-07-02 LAB — PROSTATE-SPECIFIC AG, SERUM (LABCORP): PROSTATE SPECIFIC AG, SERUM: 43.9 ng/mL — AB (ref 0.0–4.0)

## 2018-08-15 ENCOUNTER — Other Ambulatory Visit: Payer: Self-pay

## 2018-08-15 ENCOUNTER — Emergency Department (HOSPITAL_COMMUNITY)
Admission: EM | Admit: 2018-08-15 | Discharge: 2018-08-15 | Disposition: A | Discharge status: Home / Self Care | Payer: Medicare Other | Attending: Emergency Medicine | Admitting: Emergency Medicine

## 2018-08-15 ENCOUNTER — Encounter (HOSPITAL_COMMUNITY): Payer: Self-pay | Admitting: Emergency Medicine

## 2018-08-15 ENCOUNTER — Emergency Department (HOSPITAL_COMMUNITY): Payer: Medicare Other

## 2018-08-15 DIAGNOSIS — I10 Essential (primary) hypertension: Secondary | ICD-10-CM | POA: Insufficient documentation

## 2018-08-15 DIAGNOSIS — M545 Low back pain: Secondary | ICD-10-CM | POA: Diagnosis present

## 2018-08-15 DIAGNOSIS — M5416 Radiculopathy, lumbar region: Secondary | ICD-10-CM | POA: Diagnosis not present

## 2018-08-15 DIAGNOSIS — R350 Frequency of micturition: Secondary | ICD-10-CM | POA: Insufficient documentation

## 2018-08-15 DIAGNOSIS — Z87891 Personal history of nicotine dependence: Secondary | ICD-10-CM | POA: Diagnosis not present

## 2018-08-15 DIAGNOSIS — Z8546 Personal history of malignant neoplasm of prostate: Secondary | ICD-10-CM | POA: Insufficient documentation

## 2018-08-15 DIAGNOSIS — Z79899 Other long term (current) drug therapy: Secondary | ICD-10-CM | POA: Diagnosis not present

## 2018-08-15 LAB — CBC WITH DIFFERENTIAL/PLATELET
Abs Immature Granulocytes: 0.04 10*3/uL (ref 0.00–0.07)
BASOS ABS: 0 10*3/uL (ref 0.0–0.1)
Basophils Relative: 0 %
Eosinophils Absolute: 0.1 10*3/uL (ref 0.0–0.5)
Eosinophils Relative: 1 %
HEMATOCRIT: 37 % — AB (ref 39.0–52.0)
Hemoglobin: 10.9 g/dL — ABNORMAL LOW (ref 13.0–17.0)
IMMATURE GRANULOCYTES: 1 %
LYMPHS ABS: 0.9 10*3/uL (ref 0.7–4.0)
Lymphocytes Relative: 11 %
MCH: 25.1 pg — ABNORMAL LOW (ref 26.0–34.0)
MCHC: 29.5 g/dL — ABNORMAL LOW (ref 30.0–36.0)
MCV: 85.3 fL (ref 80.0–100.0)
Monocytes Absolute: 0.6 10*3/uL (ref 0.1–1.0)
Monocytes Relative: 7 %
NEUTROS ABS: 6.8 10*3/uL (ref 1.7–7.7)
NEUTROS PCT: 80 %
NRBC: 0 % (ref 0.0–0.2)
PLATELETS: 280 10*3/uL (ref 150–400)
RBC: 4.34 MIL/uL (ref 4.22–5.81)
RDW: 15.4 % (ref 11.5–15.5)
WBC: 8.5 10*3/uL (ref 4.0–10.5)

## 2018-08-15 LAB — COMPREHENSIVE METABOLIC PANEL
ALBUMIN: 3.3 g/dL — AB (ref 3.5–5.0)
ALT: 10 U/L (ref 0–44)
AST: 22 U/L (ref 15–41)
Alkaline Phosphatase: 102 U/L (ref 38–126)
Anion gap: 9 (ref 5–15)
BUN: 16 mg/dL (ref 8–23)
CHLORIDE: 106 mmol/L (ref 98–111)
CO2: 26 mmol/L (ref 22–32)
CREATININE: 0.86 mg/dL (ref 0.61–1.24)
Calcium: 8.7 mg/dL — ABNORMAL LOW (ref 8.9–10.3)
GFR calc Af Amer: >60 mL/min (ref >60)
GLUCOSE: 120 mg/dL — AB (ref 70–99)
Potassium: 3.3 mmol/L — ABNORMAL LOW (ref 3.5–5.1)
Sodium: 141 mmol/L (ref 135–145)
Total Bilirubin: 0.4 mg/dL (ref 0.3–1.2)
Total Protein: 6.7 g/dL (ref 6.5–8.1)

## 2018-08-15 LAB — URINALYSIS, ROUTINE W REFLEX MICROSCOPIC
BILIRUBIN URINE: NEGATIVE
GLUCOSE, UA: NEGATIVE mg/dL
HGB URINE DIPSTICK: NEGATIVE
Ketones, ur: NEGATIVE mg/dL
Leukocytes,Ua: NEGATIVE
Nitrite: NEGATIVE
PH: 5 (ref 5.0–8.0)
Protein, ur: NEGATIVE mg/dL
SPECIFIC GRAVITY, URINE: 1.023 (ref 1.005–1.030)

## 2018-08-15 MED ORDER — HYDROCODONE-ACETAMINOPHEN 5-325 MG PO TABS: 2.0000 | ORAL_TABLET | Freq: Four times a day (QID) | ORAL | 0 refills | Status: DC | PRN

## 2018-08-15 MED ORDER — HYDROMORPHONE HCL 1 MG/ML IJ SOLN
1.0000 mg | Freq: Once | INTRAMUSCULAR | Status: AC
  Administered 2018-08-15: 1 mg via INTRAVENOUS
  Filled 2018-08-15: qty 1

## 2018-08-15 NOTE — ED Provider Notes (Signed)
[facility] DEPT Provider Note   CSN: [id number] Arrival date & time: 08/15/18  2706    History   Chief Complaint Chief Complaint  Patient presents with  . Back Pain  . Leg Pain    HPI [name] Mathews is a 73 y.o. male.     HPI Patient has a history of metastatic prostate cancer.  He has chronic low back pain.  States his pain became acutely worse this morning around 4 AM while he was at work.  The pain radiates to his right buttock and right thigh.  He denies any numbness or weakness to his legs.  He has ongoing urinary frequency but denies incontinence or retention.  No known trauma.  Denies abdominal pain or chest pain. Past Medical History:  Diagnosis Date  . Hyperlipidemia   . Hypertension   . Prostate cancer ([place])   . Restless leg syndrome     Patient Active Problem List   Diagnosis Date Noted  . History of cluster headache 08/12/2017  . Neuropathic pain 02/09/2017  . Routine adult health maintenance 01/08/2017  . Essential hypertension 01/08/2017  . Hyperlipidemia 01/08/2017  . Cervical lymphadenopathy 01/08/2017  . Back pain 09/08/2014  . 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 prostectomy  2001        Home Medications    Prior to Admission medications   Medication Sig Start Date End Date Taking? Authorizing Provider  atorvastatin (LIPITOR) 20 MG tablet Take 1 tablet (20 mg total) by mouth daily. 01/12/18  Yes Guadalupe Dawn, MD  denosumab (XGEVA) 120 MG/1.7ML SOLN Inject 120 mg into the skin every 30 (thirty) days.   Yes [provider]  DULoxetine (CYMBALTA) 60 MG capsule Take 1 capsule (60 mg total) by mouth daily. 05/10/18  Yes Guadalupe Dawn, MD  hydrochlorothiazide (HYDRODIURIL) 25 MG tablet Take 1 tablet (25 mg total) by mouth daily. 09/08/17  Yes Mercy Riding, MD  leuprolide (LUPRON) 30 MG injection Inject 30 mg into the muscle every 4 (four)  months.   Yes [provider]  predniSONE (DELTASONE) 5 MG tablet TAKE 1 TABLET (5 MG TOTAL) BY MOUTH 2 (TWO) TIMES DAILY. 02/12/18  Yes [name], MD  rOPINIRole (REQUIP) 2 MG tablet Take 1 tablet (2 mg total) by mouth at bedtime. 05/10/18  Yes Guadalupe Dawn, MD  ZYTIGA 250 MG tablet TAKE 4 TABLETS BY MOUTH ONCE DAILY AS DIRECTED.  TAKE 1 HOUR BEFORE OR 2 HOURS AFTER A MEAL 05/21/18  Yes [name], MD  EPIPEN 2-PAK 0.3 MG/0.3ML SOAJ injection Reported on 01/04/2016 10/27/13   [provider]  HYDROcodone-acetaminophen (NORCO/VICODIN) 5-325 MG tablet Take 2 tablets by mouth every 6 (six) hours as needed for severe pain. 08/15/18   Julianne Rice, MD    Family History Family History  Problem Relation Age of Onset  . Heart attack Mother 56       died of heart attack  . Cervical cancer Sister 55  . Stroke Brother 78    Social History Social History   Tobacco Use  . Smoking status: Former Smoker    Packs/day: 0.25    Years: 6.00    Pack years: 1.50    Start date: 1969    Last attempt to quit: 1975    Years since quitting: 45.1  . Smokeless tobacco: Never Used  Substance Use Topics  . Alcohol use: No  . Drug use: No  Allergies   Bee venom; Wasp venom; and Percocet [oxycodone-acetaminophen]   Review of Systems Review of Systems  Constitutional: Negative for chills and fever.  Respiratory: Negative for cough and shortness of breath.   Cardiovascular: Negative for chest pain.  Gastrointestinal: Negative for abdominal pain, diarrhea, nausea and vomiting.  Genitourinary: Positive for frequency. Negative for difficulty urinating, flank pain and hematuria.  Musculoskeletal: Positive for back pain and myalgias. Negative for neck pain and neck stiffness.  Skin: Negative for rash and wound.  Neurological: Negative for dizziness, weakness, light-headedness, numbness and headaches.  All other systems reviewed and are negative.    Physical  Exam Updated Vital Signs BP (!) 180/85   Pulse 66   Temp 98.6 F (37 C) (Oral)   Resp 18   SpO2 95%   Physical Exam Vitals signs and nursing note reviewed.  Constitutional:      Appearance: Normal appearance. He is well-developed.     Comments: In moderate distress  HENT:     Head: Normocephalic and atraumatic.     Nose: Nose normal.     Mouth/Throat:     Mouth: Mucous membranes are moist.  Eyes:     Extraocular Movements: Extraocular movements intact.     Pupils: Pupils are equal, round, and reactive to light.  Neck:     Musculoskeletal: Normal range of motion and neck supple. No neck rigidity or muscular tenderness.  Cardiovascular:     Rate and Rhythm: Normal rate and regular rhythm.     Heart sounds: No murmur. No friction rub. No gallop.   Pulmonary:     Effort: Pulmonary effort is normal. No respiratory distress.     Breath sounds: Normal breath sounds. No stridor. No wheezing, rhonchi or rales.  Chest:     Chest wall: No tenderness.  Abdominal:     General: Bowel sounds are normal.     Palpations: Abdomen is soft. There is no mass.     Tenderness: There is no abdominal tenderness. There is no right CVA tenderness, left CVA tenderness, guarding or rebound.  Musculoskeletal: Normal range of motion.        General: Tenderness present.     Comments: Patient with inferior lumbar tenderness to palpation especially over the right paraspinal musculature.  No step-offs or deformities.  Negative straight leg raise bilaterally.  No lower extremity swelling, asymmetry or tenderness.  Patient has 2+ dorsalis pedis and posterior pulses bilaterally.  Lymphadenopathy:     Cervical: No cervical adenopathy.  Skin:    General: Skin is warm and dry.     Findings: No erythema or rash.  Neurological:     General: No focal deficit present.     Mental Status: He is alert and oriented to person, place, and time.     Comments: Ambulates without difficulty.  5/5 plantar and dorsiflexion of  bilateral feet.  Sensation fully intact including no saddle anesthesia.  Psychiatric:        Mood and Affect: Mood normal.        Behavior: Behavior normal.      ED Treatments / Results  Labs (all labs ordered are listed, but only abnormal results are displayed) Labs Reviewed  CBC WITH DIFFERENTIAL/PLATELET - Abnormal; Notable for the following components:      Result Value   Hemoglobin 10.9 (*)    HCT 37.0 (*)    MCH 25.1 (*)    MCHC 29.5 (*)    All other components within normal limits  COMPREHENSIVE  METABOLIC PANEL - Abnormal; Notable for the following components:   Potassium 3.3 (*)    Glucose, Bld 120 (*)    Calcium 8.7 (*)    Albumin 3.3 (*)    All other components within normal limits  URINALYSIS, ROUTINE W REFLEX MICROSCOPIC    EKG None  Radiology Ct Lumbar Spine Wo Contrast  Result Date: 08/15/2018 CLINICAL DATA:  Onset low back pain radiating into the left lower extremity for a.m. this morning. History of prostate cancer. No known injury. EXAM: CT LUMBAR SPINE WITHOUT CONTRAST TECHNIQUE: Multidetector CT imaging of the lumbar spine was performed without intravenous contrast administration. Multiplanar CT image reconstructions were also generated. COMPARISON:  CT abdomen and pelvis 05/11/2018. FINDINGS: Segmentation: Standard. Rudimentary rib on the left off the first lumbar vertebral body noted. Alignment: Convex left scoliosis with the apex at L4-5. Vertebrae: Sclerotic lesions are seen in all imaged bones consistent with metastatic prostate carcinoma. The appearance is not markedly changed. Largest lesion is in the right ilium measuring 5.3 cm AP and filling the medullary space measuring 2 cm transverse. No fracture. Paraspinal and other soft tissues: Atherosclerosis noted. Retro-aortocaval lymph node which had measured 1 cm on the prior study measures 1.4 cm short axis dimension on image 43 today. Disc levels: T12-L1: Negative. L1-2: Mild facet degenerative change.  There is loss of disc space height and a mild bulge. No stenosis. L2-3: Mild facet degenerative change. Mild loss of disc space height and mild vacuum disc phenomenon. No stenosis. L3-4: Mild-to-moderate facet arthropathy is worse on the right. There is some ligamentum flavum thickening and a shallow disc bulge. Mild central canal and mild to moderate foraminal narrowing. Foraminal narrowing appears worse on the left. L4-5: Moderate to moderately severe facet degenerative change, worse on the right. There is ligamentum flavum thickening and a shallow disc bulge. Moderate to moderately severe central canal stenosis is present with narrowing in the subarticular recesses. Mild left and moderately severe right foraminal narrowing is identified. L5-S1: Moderate facet arthropathy. Loss of disc space height and a shallow bulge with endplate spur. There is some ligamentum flavum thickening. Mild to moderate central canal narrowing is seen. Moderately severe to severe foraminal narrowing is worse on the right. IMPRESSION: Negative for fracture or other acute abnormality. No change in the appearance of sclerotic lesions in the spine and pelvis consistent with metastatic prostate carcinoma. Retroaortic lymph node which measured 1 cm on the prior exam has increased in size to 1.4 cm today. Moderate to moderately severe central canal stenosis with narrowing in the subarticular recesses and moderately severe right foraminal narrowing at L4-5. Moderately severe to severe bilateral foraminal narrowing at L5-S1 is worse on the right. Mild central canal and mild to moderate bilateral foraminal narrowing at L3-4. Electronically Signed   By: Inge Rise M.D.   On: 08/15/2018 11:00    Procedures Procedures (including critical care time)  Medications Ordered in ED Medications  HYDROmorphone (DILAUDID) injection 1 mg (1 mg Intravenous Given 08/15/18 1035)     Initial Impression / Assessment and Plan / ED Course  I have  reviewed the triage vital signs and the nursing notes.  Pertinent labs & imaging results that were available during my care of the patient were reviewed by me and considered in my medical decision making (see chart for details).       Patient states the pain is significantly improved after medication.  Continues to have a normal neurologic exam.  CT lumbar spine without evidence  of pathologic fracture.  Patient does have right greater than left severe foraminal narrowing of L4-S1 with some central canal stenosis.  He is advised to follow-up with spinal surgery.  Return precautions have been given.   Final Clinical Impressions(s) / ED Diagnoses   Final diagnoses:  Lumbar radiculopathy, right    ED Discharge Orders         Ordered    HYDROcodone-acetaminophen (NORCO/VICODIN) 5-325 MG tablet  Every 6 hours PRN     08/15/18 1217           Julianne Rice, MD 08/15/18 1218

## 2018-08-15 NOTE — ED Triage Notes (Signed)
Pt c/o lower back pain since 4 am this morning, pt states it radiates into LLE / L thigh. Pt has not taken any OTC meds for pain, denies injury to back.

## 2018-08-31 ENCOUNTER — Inpatient Hospital Stay: Payer: Medicare Other

## 2018-08-31 ENCOUNTER — Telehealth: Payer: Self-pay | Admitting: Oncology

## 2018-08-31 ENCOUNTER — Inpatient Hospital Stay (HOSPITAL_BASED_OUTPATIENT_CLINIC_OR_DEPARTMENT_OTHER): Payer: Medicare Other | Admitting: Oncology

## 2018-08-31 ENCOUNTER — Inpatient Hospital Stay: Payer: Medicare Other | Attending: Oncology

## 2018-08-31 VITALS — BP 132/72 | HR 70 | Temp 98.3°F | Resp 18 | Ht 65.0 in | Wt 169.9 lb

## 2018-08-31 DIAGNOSIS — C61 Malignant neoplasm of prostate: Secondary | ICD-10-CM

## 2018-08-31 DIAGNOSIS — M545 Low back pain: Secondary | ICD-10-CM

## 2018-08-31 DIAGNOSIS — E291 Testicular hypofunction: Secondary | ICD-10-CM

## 2018-08-31 DIAGNOSIS — I10 Essential (primary) hypertension: Secondary | ICD-10-CM | POA: Insufficient documentation

## 2018-08-31 DIAGNOSIS — C7951 Secondary malignant neoplasm of bone: Secondary | ICD-10-CM | POA: Insufficient documentation

## 2018-08-31 LAB — CMP (CANCER CENTER ONLY)
ALBUMIN: 3 g/dL — AB (ref 3.5–5.0)
ALK PHOS: 110 U/L (ref 38–126)
ALT: 7 U/L (ref 0–44)
AST: 21 U/L (ref 15–41)
Anion gap: 8 (ref 5–15)
BILIRUBIN TOTAL: 0.4 mg/dL (ref 0.3–1.2)
BUN: 20 mg/dL (ref 8–23)
CALCIUM: 8.9 mg/dL (ref 8.9–10.3)
CO2: 32 mmol/L (ref 22–32)
CREATININE: 0.94 mg/dL (ref 0.61–1.24)
Chloride: 100 mmol/L (ref 98–111)
GFR, Est AFR Am: >60 mL/min (ref >60)
GLUCOSE: 100 mg/dL — AB (ref 70–99)
Potassium: 4.3 mmol/L (ref 3.5–5.1)
Sodium: 140 mmol/L (ref 135–145)
TOTAL PROTEIN: 6.8 g/dL (ref 6.5–8.1)

## 2018-08-31 LAB — CBC WITH DIFFERENTIAL (CANCER CENTER ONLY)
ABS IMMATURE GRANULOCYTES: 0.02 10*3/uL (ref 0.00–0.07)
BASOS PCT: 0 %
Basophils Absolute: 0 10*3/uL (ref 0.0–0.1)
EOS PCT: 2 %
Eosinophils Absolute: 0.1 10*3/uL (ref 0.0–0.5)
HCT: 35.6 % — ABNORMAL LOW (ref 39.0–52.0)
Hemoglobin: 10.9 g/dL — ABNORMAL LOW (ref 13.0–17.0)
Immature Granulocytes: 0 %
Lymphocytes Relative: 13 %
Lymphs Abs: 0.9 10*3/uL (ref 0.7–4.0)
MCH: 25.3 pg — AB (ref 26.0–34.0)
MCHC: 30.6 g/dL (ref 30.0–36.0)
MCV: 82.8 fL (ref 80.0–100.0)
MONO ABS: 0.6 10*3/uL (ref 0.1–1.0)
Monocytes Relative: 8 %
NEUTROS ABS: 5.4 10*3/uL (ref 1.7–7.7)
Neutrophils Relative %: 77 %
PLATELETS: 229 10*3/uL (ref 150–400)
RBC: 4.3 MIL/uL (ref 4.22–5.81)
RDW: 15.2 % (ref 11.5–15.5)
WBC: 7.1 10*3/uL (ref 4.0–10.5)
nRBC: 0 % (ref 0.0–0.2)

## 2018-08-31 MED ORDER — LEUPROLIDE ACETATE (4 MONTH) 30 MG IM KIT: 30.0000 mg | PACK | Freq: Once | INTRAMUSCULAR | Status: DC

## 2018-08-31 MED ORDER — DENOSUMAB 120 MG/1.7ML ~~LOC~~ SOLN
120.0000 mg | Freq: Once | SUBCUTANEOUS | Status: AC
  Administered 2018-08-31: 120 mg via SUBCUTANEOUS

## 2018-08-31 MED ORDER — DENOSUMAB 120 MG/1.7ML ~~LOC~~ SOLN
SUBCUTANEOUS | Status: AC
  Filled 2018-08-31: qty 1.7

## 2018-08-31 NOTE — Telephone Encounter (Signed)
Gave avs and calendar ° °

## 2018-08-31 NOTE — Patient Instructions (Addendum)

## 2018-08-31 NOTE — Addendum Note (Signed)
Addended by: [name] on: 08/31/2018 02:46 PM   Modules accepted: Orders

## 2018-08-31 NOTE — Progress Notes (Signed)
Hematology and Oncology Follow Up Visit  [name] [id number] 11-28-1945 73 y.o. 08/31/2018 1:17 PM    Principle Diagnosis: 73 year old man with advanced prostate cancer with disease to the bone documented in 2013.  He was diagnosed with Gleason score of 4+5 = 9 and 2001 with locally advanced disease.   Prior Therapy: 1. He was started on Lupron after attempted prostatectomy in 2001.    2. 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 He developed bony metastasis and PSA up to 31.  4. He is status post radiation therapy to the prostate fossa completed in December 2018. He received 52.5 gr 20 fractions.  Current therapy:  Zytiga 1000 mg po daily with prednisone 5 mg started in 09/2011.  Xgeva 120 mg started on 10/31/2011.   Lupron 30 mg every 4 months.    Interim History: [name] is here for a follow-up.  Since the last visit, he reports no major changes in his health.  He did report to increase in right-sided back pain that is radiating to his leg.  He was seen in the emergency department on August 15, 2018 and CT scan of the lumbar spine was obtained and did not show any fracture, dislocation or advanced malignancy.  Was diagnosed with sciatica at that time.  He reports slight improvement in his pain although he still has some discomfort otherwise.  He is ambulating without any difficulties and denies any falls or weakness.  He continues to tolerate Zytiga without any recent complaints.  He denies any excessive fatigue or nausea.  He denies any pathological fractures or edema.  His performance status quality of life remains unchanged.  Patient denied any alteration mental status, neuropathy, confusion or dizziness.  Denies any headaches or lethargy.  Denies any night sweats, weight loss or changes in appetite.  Denied orthopnea, dyspnea on exertion or chest discomfort.  Denies shortness of breath, difficulty breathing hemoptysis or cough.  Denies any abdominal distention,  nausea, early satiety or dyspepsia.  Denies any hematuria, frequency, dysuria or nocturia.  Denies any skin irritation, dryness or rash.  Denies any ecchymosis or petechiae.  Denies any lymphadenopathy or clotting.  Denies any heat or cold intolerance.  Denies any anxiety or depression.  Remaining review of system is negative.    Medications: I have reviewed the patient's current medications. Current Outpatient Medications  Medication Sig Dispense Refill  . atorvastatin (LIPITOR) 20 MG tablet Take 1 tablet (20 mg total) by mouth daily. 90 tablet 3  . denosumab (XGEVA) 120 MG/1.7ML SOLN Inject 120 mg into the skin every 30 (thirty) days.    . DULoxetine (CYMBALTA) 60 MG capsule Take 1 capsule (60 mg total) by mouth daily. 90 capsule 3  . EPIPEN 2-PAK 0.3 MG/0.3ML SOAJ injection Reported on 01/04/2016    . hydrochlorothiazide (HYDRODIURIL) 25 MG tablet Take 1 tablet (25 mg total) by mouth daily. 90 tablet 3  . HYDROcodone-acetaminophen (NORCO/VICODIN) 5-325 MG tablet Take 2 tablets by mouth every 6 (six) hours as needed for severe pain. 10 tablet 0  . leuprolide (LUPRON) 30 MG injection Inject 30 mg into the muscle every 4 (four) months.    . predniSONE (DELTASONE) 5 MG tablet TAKE 1 TABLET (5 MG TOTAL) BY MOUTH 2 (TWO) TIMES DAILY. 90 tablet 3  . rOPINIRole (REQUIP) 2 MG tablet Take 1 tablet (2 mg total) by mouth at bedtime. 90 tablet 3  . ZYTIGA 250 MG tablet TAKE 4 TABLETS BY MOUTH ONCE DAILY AS DIRECTED.  TAKE 1 HOUR BEFORE OR 2 HOURS AFTER A MEAL 120 tablet 7   No current facility-administered medications for this visit.      Allergies:  Allergies  Allergen Reactions  . Bee Venom Anaphylaxis, Shortness Of Breath and Swelling    Tongue swelling.   . Wasp Venom Anaphylaxis, Shortness Of Breath and Swelling    Tongue swelling   . Percocet [Oxycodone-Acetaminophen] Palpitations    His past medical history was reviewed today and is unchanged.    Physical Exam:  Blood pressure  132/72, pulse 70, temperature 98.3 F (36.8 C), temperature source Oral, resp. rate 18, height 5\' 5"  (1.651 m), weight 169 lb 14.4 oz (77.1 kg), SpO2 99 %.     ECOG: 0   General appearance: Comfortable appearing without any discomfort Head: Normocephalic without any trauma Oropharynx: Mucous membranes are moist and pink without any thrush or ulcers. Eyes: Pupils are equal and round reactive to light. Lymph nodes: No cervical, supraclavicular, inguinal or axillary lymphadenopathy.   Heart:regular rate and rhythm.  S1 and S2 without leg edema. Lung: Clear without any rhonchi or wheezes.  No dullness to percussion. Abdomin: Soft, nontender, nondistended with good bowel sounds.  No hepatosplenomegaly. Musculoskeletal: No joint deformity or effusion.  Full range of motion noted. Neurological: No deficits noted on motor, sensory and deep tendon reflex exam. Skin: No petechial rash or dryness.  Appeared moist.        Lab Results: Lab Results  Component Value Date   WBC 8.5 08/15/2018   HGB 10.9 (L) 08/15/2018   HCT 37.0 (L) 08/15/2018   MCV 85.3 08/15/2018   PLT 280 08/15/2018     Chemistry      Component Value Date/Time   NA 141 08/15/2018 1019   NA 141 05/20/2017 1425   K 3.3 (L) 08/15/2018 1019   K 4.3 05/20/2017 1425   CL 106 08/15/2018 1019   CL 105 12/08/2012 1504   CO2 26 08/15/2018 1019   CO2 31 (H) 05/20/2017 1425   BUN 16 08/15/2018 1019   BUN 17.8 05/20/2017 1425   CREATININE 0.86 08/15/2018 1019   CREATININE 0.94 07/01/2018 1214   CREATININE 0.9 05/20/2017 1425      Component Value Date/Time   CALCIUM 8.7 (L) 08/15/2018 1019   CALCIUM 9.4 05/20/2017 1425   ALKPHOS 102 08/15/2018 1019   ALKPHOS 72 05/20/2017 1425   AST 22 08/15/2018 1019   AST 17 07/01/2018 1214   AST 15 05/20/2017 1425   ALT 10 08/15/2018 1019   ALT 9 07/01/2018 1214   ALT <6 05/20/2017 1425   BILITOT 0.4 08/15/2018 1019   BILITOT 0.6 07/01/2018 1214   BILITOT 0.25 05/20/2017 1425          Results for DRYSTAN, READER (MRN [id number]) as of 08/31/2018 13:17  Ref. Range 05/11/2018 10:09 07/01/2018 12:14  Prostate Specific Ag, Serum Latest Ref Range: 0.0 - 4.0 ng/mL 40.9 (H) 43.9 (H)   IMPRESSION: Negative for fracture or other acute abnormality.  No change in the appearance of sclerotic lesions in the spine and pelvis consistent with metastatic prostate carcinoma. Retroaortic lymph node which measured 1 cm on the prior exam has increased in size to 1.4 cm today.  Moderate to moderately severe central canal stenosis with narrowing in the subarticular recesses and moderately severe right foraminal narrowing at L4-5.  Moderately severe to severe bilateral foraminal narrowing at L5-S1 is worse on the right.  Mild central canal and mild to moderate bilateral foraminal  narrowing at L3-4.    Impression and Plan:   73 year old man with:    1.  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  He remains on Zytiga without any major complaints with disease under reasonable control.  His PSA has not dramatically changed based on PSA and CT scan obtained on August 15, 2018.  Risks and benefits of continuing this treatment was discussed today and is agreeable to continue.  Different salvage therapy options were reviewed which include Xtandi, systemic chemotherapy and Xofigo.  For the time being I recommended continuing Zytiga without any changes.    2. Bone directed therapy: I recommended continuing Xgeva at this time.  Long-term complications including osteonecrosis of the jaw and hypocalcemia were reiterated.  Is agreeable to continue at this time.   3. Androgen deprivation: He is up-to-date with last Lupron given on July 01, 2018.  Long-term complications associated with this treatment were reiterated and he will receive his next injection in May 2020.  4.  Back pain: Does not appear to be related to  malignancy.  I have recommended evaluation by orthopedics or neurosurgery regarding his spinal stenosis.  He will follow-up with his primary care physician regarding this issue.  5.  Hypertension: Blood pressure appears to be under control on Zytiga.  6. Followup: Will be in 2 months for repeat evaluation and Lupron as well as Xgeva injection.   25  minutes was spent with the patient face-to-face today.  More than 50% of time was dedicated to updating his disease status, treatment options, answering questions regarding future plan of care.     Zola Button, MD 3/10/20201:17 PM

## 2018-09-01 LAB — PROSTATE-SPECIFIC AG, SERUM (LABCORP): PROSTATE SPECIFIC AG, SERUM: 52.5 ng/mL — AB (ref 0.0–4.0)

## 2018-09-05 ENCOUNTER — Other Ambulatory Visit: Payer: Self-pay

## 2018-09-05 ENCOUNTER — Encounter (HOSPITAL_COMMUNITY): Payer: Self-pay | Admitting: *Deleted

## 2018-09-05 ENCOUNTER — Emergency Department (HOSPITAL_COMMUNITY)
Admission: EM | Admit: 2018-09-05 | Discharge: 2018-09-05 | Disposition: A | Discharge status: Home / Self Care | Payer: Medicare Other | Attending: Emergency Medicine | Admitting: Emergency Medicine

## 2018-09-05 DIAGNOSIS — Z87891 Personal history of nicotine dependence: Secondary | ICD-10-CM | POA: Insufficient documentation

## 2018-09-05 DIAGNOSIS — I10 Essential (primary) hypertension: Secondary | ICD-10-CM | POA: Insufficient documentation

## 2018-09-05 DIAGNOSIS — M5431 Sciatica, right side: Secondary | ICD-10-CM

## 2018-09-05 DIAGNOSIS — Z8546 Personal history of malignant neoplasm of prostate: Secondary | ICD-10-CM | POA: Diagnosis not present

## 2018-09-05 DIAGNOSIS — Z79899 Other long term (current) drug therapy: Secondary | ICD-10-CM | POA: Diagnosis not present

## 2018-09-05 DIAGNOSIS — M545 Low back pain: Secondary | ICD-10-CM | POA: Diagnosis present

## 2018-09-05 MED ORDER — HYDROCODONE-ACETAMINOPHEN 5-325 MG PO TABS
2.0000 | ORAL_TABLET | Freq: Once | ORAL | Status: AC
  Administered 2018-09-05: 2 via ORAL
  Filled 2018-09-05: qty 2

## 2018-09-05 MED ORDER — PREDNISONE 10 MG PO TABS: 40.0000 mg | ORAL_TABLET | Freq: Every day | ORAL | 0 refills | Status: AC

## 2018-09-05 MED ORDER — GABAPENTIN 300 MG PO CAPS: 300.0000 mg | ORAL_CAPSULE | Freq: Two times a day (BID) | ORAL | 0 refills | Status: DC

## 2018-09-05 NOTE — ED Provider Notes (Signed)
[place] DEPT Provider Note   CSN: [id number] Arrival date & time: 09/05/18  1213    History   Chief Complaint Chief Complaint  Patient presents with  . Back Pain  . Leg Pain    HPI [name] Mathews is a 73 y.o. male.     Patient is a 73 year old African-American male with past medical history of hypertension, prostate cancer who presents the emergency department for lower back pain radiating to his right leg.  Patient initially started a few weeks ago.  He was seen in the ED and had a CT scan significant for some spinal stenosis but no acute fracture or other abnormal findings.  Patient was given some hydrocodone he says that this helps.  Patient reports that the pain started again after work last night around 1 AM.  Reports that it radiates from his lower back and into the right lower extremity.  Denies any weakness in the leg, saddle anesthesia, loss of control of bowel or bladder, fever, new injury or trauma.  Reports that he has follow-up with his primary care doctor on the 23rd of this month for treatment of this.     Past Medical History:  Diagnosis Date  . Hyperlipidemia   . Hypertension   . Prostate cancer ([name] Mathews)   . Restless leg syndrome     Patient Active Problem List   Diagnosis Date Noted  . History of cluster headache 08/12/2017  . Neuropathic pain 02/09/2017  . Routine adult health maintenance 01/08/2017  . Essential hypertension 01/08/2017  . Hyperlipidemia 01/08/2017  . Cervical lymphadenopathy 01/08/2017  . Back pain 09/08/2014  . 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 prostectomy  2001        Home Medications    Prior to Admission medications   Medication Sig Start Date End Date Taking? Authorizing Provider  atorvastatin (LIPITOR) 20 MG tablet Take 1 tablet (20 mg total) by mouth daily. 01/12/18   Guadalupe Dawn, MD  denosumab (XGEVA) 120 MG/1.7ML  SOLN Inject 120 mg into the skin every 30 (thirty) days.    [provider]  DULoxetine (CYMBALTA) 60 MG capsule Take 1 capsule (60 mg total) by mouth daily. 05/10/18   Guadalupe Dawn, MD  EPIPEN 2-PAK 0.3 MG/0.3ML SOAJ injection Reported on 01/04/2016 10/27/13   [provider]  hydrochlorothiazide (HYDRODIURIL) 25 MG tablet Take 1 tablet (25 mg total) by mouth daily. 09/08/17   Mercy Riding, MD  HYDROcodone-acetaminophen (NORCO/VICODIN) 5-325 MG tablet Take 2 tablets by mouth every 6 (six) hours as needed for severe pain. 08/15/18   Julianne Rice, MD  leuprolide (LUPRON) 30 MG injection Inject 30 mg into the muscle every 4 (four) months.    [provider]  predniSONE (DELTASONE) 10 MG tablet Take 4 tablets (40 mg total) by mouth daily for 5 days. 09/05/18 09/10/18  Madilyn Hook A, PA-C  predniSONE (DELTASONE) 5 MG tablet TAKE 1 TABLET (5 MG TOTAL) BY MOUTH 2 (TWO) TIMES DAILY. 02/12/18   [name], MD  rOPINIRole (REQUIP) 2 MG tablet Take 1 tablet (2 mg total) by mouth at bedtime. 05/10/18   Guadalupe Dawn, MD  ZYTIGA 250 MG tablet TAKE 4 TABLETS BY MOUTH ONCE DAILY AS DIRECTED.  TAKE 1 HOUR BEFORE OR 2 HOURS AFTER A MEAL 05/21/18   [name], MD    Family History Family History  Problem Relation Age of Onset  . Heart attack  Mother 39       died of heart attack  . Cervical cancer Sister 65  . Stroke Brother 37    Social History Social History   Tobacco Use  . Smoking status: Former Smoker    Packs/day: 0.25    Years: 6.00    Pack years: 1.50    Start date: 1969    Last attempt to quit: 1975    Years since quitting: 45.2  . Smokeless tobacco: Never Used  Substance Use Topics  . Alcohol use: No  . Drug use: No     Allergies   Bee venom; Wasp venom; and Percocet [oxycodone-acetaminophen]   Review of Systems Review of Systems  Constitutional: Negative for chills and fever.  HENT: Negative for ear pain and sore throat.   Eyes:  Negative for pain and visual disturbance.  Respiratory: Negative for cough and shortness of breath.   Cardiovascular: Negative for chest pain and palpitations.  Gastrointestinal: Negative for abdominal pain and vomiting.  Genitourinary: Negative for dysuria and hematuria.  Musculoskeletal: Positive for arthralgias and back pain. Negative for gait problem and myalgias.  Skin: Negative for color change and rash.  Neurological: Negative for seizures and syncope.  All other systems reviewed and are negative.    Physical Exam Updated Vital Signs BP 122/73 (BP Location: Right Arm)   Pulse 60   Temp 98 F (36.7 C) (Oral)   Resp 17   SpO2 97%   Physical Exam Vitals signs and nursing note reviewed.  Constitutional:      Appearance: Normal appearance.  HENT:     Head: Normocephalic.  Eyes:     Conjunctiva/sclera: Conjunctivae normal.  Pulmonary:     Effort: Pulmonary effort is normal.  Musculoskeletal:     Lumbar back: He exhibits tenderness. He exhibits no bony tenderness.       Back:  Skin:    General: Skin is dry.     Capillary Refill: Capillary refill takes less than 2 seconds.  Neurological:     General: No focal deficit present.     Mental Status: He is alert.     Motor: No weakness.     Coordination: Coordination normal.     Gait: Gait normal.     Comments: Normal bilateral lower extremity strength and sensation.  Normal deep tendon reflexes, equal bilaterally  Psychiatric:        Mood and Affect: Mood normal.      ED Treatments / Results  Labs (all labs ordered are listed, but only abnormal results are displayed) Labs Reviewed - No data to display  EKG None  Radiology No results found.  Procedures Procedures (including critical care time)  Medications Ordered in ED Medications  HYDROcodone-acetaminophen (NORCO/VICODIN) 5-325 MG per tablet 2 tablet (2 tablets Oral Given 09/05/18 1248)     Initial Impression / Assessment and Plan / ED Course  I have  reviewed the triage vital signs and the nursing notes.  Pertinent labs & imaging results that were available during my care of the patient were reviewed by me and considered in my medical decision making (see chart for details).  Clinical Course as of Sep 05 1251  Sun Sep 05, 2018  1251 I am going to treat for recurrent lumbar radicular pain. No new injury or trauma. Recent CT shows stenosis of the lumbar region, worse on the right. Patient has no concerning symptoms or physical exam findings including no fever, no loss of control of bowel or bladder, no  urinary retention, no saddle anesthesia, no leg weakness. He was given medication to treat the symptoms and advised to f/u with PMD for further workup including possible PT, medication change, further imaging, etc. She was advised on all concerning symptoms above and to return to the ED if any of them arise.       [KM]    Clinical Course User Index [KM] Alveria Apley, PA-C       Based on review of vitals, medical screening exam, lab work and/or imaging, there does not appear to be an acute, emergent etiology for the patient's symptoms. Counseled pt on good return precautions and encouraged both PCP and ED follow-up as needed.  Prior to discharge, I also discussed incidental imaging findings with patient in detail and advised appropriate, recommended follow-up in detail.  Clinical Impression: 1. Sciatica of right side     Disposition: Discharge   This note was prepared with assistance of Dragon voice recognition software. Occasional wrong-word or sound-a-like substitutions may have occurred due to the inherent limitations of voice recognition software.   Final Clinical Impressions(s) / ED Diagnoses   Final diagnoses:  Sciatica of right side    ED Discharge Orders         Ordered    predniSONE (DELTASONE) 10 MG tablet  Daily     09/05/18 1253           Kristine Royal 09/05/18 1253    Lacretia Leigh, MD  09/07/18 [phone]

## 2018-09-05 NOTE — ED Notes (Signed)
Patient given discharge teaching and verbalized understanding. Patient ambulated out of ED with a steady gait. 

## 2018-09-05 NOTE — ED Notes (Signed)
Discharge delayed due to patient wanting a change in the prescriptions sent to the pharmacy.

## 2018-09-05 NOTE — Discharge Instructions (Signed)
Thank you for allowing me to care for you today. Please return to the emergency department if you have new or worsening symptoms. Take your medications as instructed.  ° °

## 2018-09-05 NOTE — ED Triage Notes (Signed)
Pt complains of back pain radiating to his right leg. Pt had similar problem 2 weeks ago.

## 2018-09-08 ENCOUNTER — Other Ambulatory Visit: Payer: Self-pay

## 2018-09-08 DIAGNOSIS — I10 Essential (primary) hypertension: Secondary | ICD-10-CM

## 2018-09-08 MED ORDER — HYDROCHLOROTHIAZIDE 25 MG PO TABS: 25.0000 mg | ORAL_TABLET | Freq: Every day | ORAL | 3 refills | Status: DC

## 2018-09-12 ENCOUNTER — Other Ambulatory Visit: Payer: Self-pay

## 2018-09-12 ENCOUNTER — Ambulatory Visit (HOSPITAL_COMMUNITY)
Admission: EM | Admit: 2018-09-12 | Discharge: 2018-09-12 | Disposition: A | Discharge status: Home / Self Care | Payer: Medicare Other | Attending: Family Medicine | Admitting: Family Medicine

## 2018-09-12 ENCOUNTER — Encounter (HOSPITAL_COMMUNITY): Payer: Self-pay | Admitting: *Deleted

## 2018-09-12 DIAGNOSIS — I10 Essential (primary) hypertension: Secondary | ICD-10-CM | POA: Diagnosis not present

## 2018-09-12 DIAGNOSIS — R059 Cough, unspecified: Secondary | ICD-10-CM

## 2018-09-12 DIAGNOSIS — R05 Cough: Secondary | ICD-10-CM

## 2018-09-12 MED ORDER — ALBUTEROL SULFATE HFA 108 (90 BASE) MCG/ACT IN AERS: 1.0000 | INHALATION_SPRAY | Freq: Four times a day (QID) | RESPIRATORY_TRACT | 0 refills | Status: DC | PRN

## 2018-09-12 MED ORDER — CETIRIZINE HCL 5 MG PO TABS: 5.0000 mg | ORAL_TABLET | Freq: Every day | ORAL | 1 refills | Status: DC

## 2018-09-12 MED ORDER — BENZONATATE 100 MG PO CAPS: 100.0000 mg | ORAL_CAPSULE | Freq: Three times a day (TID) | ORAL | 0 refills | Status: DC

## 2018-09-12 NOTE — Discharge Instructions (Addendum)
Sending some medications to the pharmacy to help with your symptoms.  Albuterol inhaler to use as needed for coughing, wheezing, SOB.  Tessalon pearls as needed for cough.  I recommend starting a daily Zyrtec due to allergy season. Follow up as needed for continued or worsening symptoms

## 2018-09-12 NOTE — ED Provider Notes (Signed)
[facility]    CSN: [id number] Arrival date & time: 09/12/18  1232     History   Chief Complaint Chief Complaint  Patient presents with   Cough   Wheezing    HPI [name] Mathews is a 73 y.o. male.   Patient is a 73 year old male past medical history of hyperlipidemia, hypertension, prostate cancer.  He presents with approximate 6 days of cough, mild wheezing.  The wheezing is worse at night.  The cough is nonproductive. Hx of allergies and bronchitis. He has not been taking allergy meds.   He has not been taking any other medication for his symptoms.  Denies any associated shortness of breath, chest pain. Denies any fevers, chills, body aches, night sweats, hemoptysis, fatigue, weight loss.  No recent sick contacts or recent traveling.  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 Authorizing Provider  atorvastatin (LIPITOR) 20 MG tablet Take 1 tablet (20 mg total) by mouth daily. 01/12/18  Yes Guadalupe Dawn, MD  denosumab (XGEVA) 120 MG/1.7ML SOLN Inject 120 mg into the skin every 30 (thirty) days.   Yes [provider]  DULoxetine (CYMBALTA) 60 MG capsule Take 1 capsule (60 mg total) by mouth daily. 05/10/18  Yes Guadalupe Dawn, MD  gabapentin (NEURONTIN) 300  MG capsule Take 1 capsule (300 mg total) by mouth 2 (two) times daily for 7 days. 09/05/18 09/12/18 Yes Alveria Apley, PA-C  hydrochlorothiazide (HYDRODIURIL) 25 MG tablet Take 1 tablet (25 mg total) by mouth daily. 09/08/18  Yes Lockamy, Timothy, DO  leuprolide (LUPRON) 30 MG injection Inject 30 mg into the muscle every 4 (four) months.   Yes [provider]  predniSONE (DELTASONE) 5 MG tablet TAKE 1 TABLET (5 MG TOTAL) BY MOUTH 2 (TWO) TIMES DAILY. 02/12/18  Yes [name], MD  rOPINIRole (REQUIP) 2 MG tablet Take 1 tablet (2 mg total) by mouth at bedtime. 05/10/18  Yes Guadalupe Dawn, MD  ZYTIGA 250 MG tablet TAKE 4 TABLETS BY MOUTH ONCE DAILY AS DIRECTED.  TAKE 1 HOUR BEFORE OR 2 HOURS AFTER A MEAL 05/21/18  Yes [name], MD  albuterol (PROVENTIL HFA;VENTOLIN HFA) 108 (90 Base) MCG/ACT inhaler Inhale 1-2 puffs into the lungs every 6 (six) hours as needed for wheezing or shortness of breath. 09/12/18   Langley Ingalls A, NP  benzonatate (TESSALON) 100 MG capsule Take 1 capsule (100 mg total) by mouth every 8 (eight) hours. 09/12/18   Loura Halt A, NP  cetirizine (ZYRTEC) 5 MG tablet Take 1 tablet (5 mg total) by mouth daily. 09/12/18   Orvan July, NP  EPIPEN 2-PAK 0.3 MG/0.3ML SOAJ injection Reported on 01/04/2016 10/27/13   [provider]  HYDROcodone-acetaminophen (NORCO/VICODIN)  5-325 MG tablet Take 2 tablets by mouth every 6 (six) hours as needed for severe pain. 08/15/18   Julianne Rice, MD    Family History Family History  Problem Relation Age of Onset   Heart attack Mother 40       died of heart attack   Cervical cancer Sister 75   Stroke Brother 52    Social History Social History   Tobacco Use   Smoking status: Former Smoker    Packs/day: 0.25    Years: 6.00    Pack years: 1.50    Start date: 1969    Last attempt to quit: 1975    Years since quitting: 45.2   Smokeless tobacco: Never Used  Substance Use Topics   Alcohol use: No   Drug  use: No     Allergies   Bee venom; Wasp venom; and Percocet [oxycodone-acetaminophen]   Review of Systems Review of Systems   Physical Exam Triage Vital Signs ED Triage Vitals  Enc Vitals Group     BP 09/12/18 1242 (!) 184/85     Pulse Rate 09/12/18 1242 80     Resp 09/12/18 1242 16     Temp 09/12/18 1242 98.4 F (36.9 C)     Temp Source 09/12/18 1242 Oral     SpO2 09/12/18 1242 99 %     Weight --      Height --      Head Circumference --      Peak Flow --      Pain Score 09/12/18 1247 0     Pain Loc --      Pain Edu? --      Excl. in [place]? --    No data found.  Updated Vital Signs BP (!) 184/85 (BP Location: Left Arm)    Pulse 80    Temp 98.4 F (36.9 C) (Oral)    Resp 16    SpO2 99%   Visual Acuity Right Eye Distance:   Left Eye Distance:   Bilateral Distance:    Right Eye Near:   Left Eye Near:    Bilateral Near:     Physical Exam Vitals signs and nursing note reviewed.  Constitutional:      General: He is not in acute distress.    Appearance: Normal appearance. He is well-developed. He is not ill-appearing, toxic-appearing or diaphoretic.  HENT:     Head: Normocephalic and atraumatic.     Right Ear: Tympanic membrane and ear canal normal.     Left Ear: Tympanic membrane and ear canal normal.     Mouth/Throat:     Pharynx: Oropharynx is clear.  Eyes:     Conjunctiva/sclera: Conjunctivae normal.  Neck:     Musculoskeletal: Neck supple.  Cardiovascular:     Rate and Rhythm: Normal rate and regular rhythm.     Heart sounds: No murmur.  Pulmonary:     Effort: Pulmonary effort is normal. No respiratory distress.     Breath sounds: Normal breath sounds.  Musculoskeletal: Normal range of motion.  Skin:    General: Skin is warm and dry.  Neurological:     Mental Status: He is alert.  Psychiatric:        Mood and Affect: Mood normal.      UC Treatments / Results  Labs (all labs ordered are listed, but only abnormal results are displayed) Labs  Reviewed - No data to display  EKG None  Radiology No results found.  Procedures Procedures (  including critical care time)  Medications Ordered in UC Medications - No data to display  Initial Impression / Assessment and Plan / UC Course  I have reviewed the triage vital signs and the nursing notes.  Pertinent labs & imaging results that were available during my care of the patient were reviewed by me and considered in my medical decision making (see chart for details).    Cough-   Pt appears well on exam VSS he is non toxic or ill appearing. No dyspnea or distress Lungs clear.  Most likely symptoms are allergy or viral  related. He is having wheezing worse at night. No chest pain, SOB or fevers.  We will have him start Zyrtec daily.  Albuterol inhaler to use as needed and Tessalon Perles for cough. Follow up as needed for continued or worsening symptoms  Final Clinical Impressions(s) / UC Diagnoses   Final diagnoses:  Cough     Discharge Instructions     Sending some medications to the pharmacy to help with your symptoms.  Albuterol inhaler to use as needed for coughing, wheezing, SOB.  Tessalon pearls as needed for cough.  I recommend starting a daily Zyrtec due to allergy season. Follow up as needed for continued or worsening symptoms     ED Prescriptions    Medication Sig Dispense Auth. Provider   albuterol (PROVENTIL HFA;VENTOLIN HFA) 108 (90 Base) MCG/ACT inhaler Inhale 1-2 puffs into the lungs every 6 (six) hours as needed for wheezing or shortness of breath. 1 Inhaler Millard Bautch A, NP   benzonatate (TESSALON) 100 MG capsule Take 1 capsule (100 mg total) by mouth every 8 (eight) hours. 21 capsule Zeyna Mkrtchyan A, NP   cetirizine (ZYRTEC) 5 MG tablet Take 1 tablet (5 mg total) by mouth daily. 30 tablet Loura Halt A, NP     Controlled Substance Prescriptions  Controlled Substance Registry consulted? Not Applicable   Orvan July, NP 09/12/18 1323

## 2018-09-12 NOTE — ED Triage Notes (Signed)
C/O cough x 6 days; started with wheezing this AM.  Denies SOB @ this time.  Denies any fevers.

## 2018-09-15 ENCOUNTER — Telehealth: Payer: Self-pay | Admitting: Family Medicine

## 2018-09-15 NOTE — Telephone Encounter (Signed)
Family medicine telephone note  Called patient to attempt to move 3/26 visit to another time or discuss over the phone. No answer, will attempt again later.  Guadalupe Dawn MD PGY-2 Family Medicine Resident

## 2018-09-16 ENCOUNTER — Ambulatory Visit: Payer: Medicare Other | Admitting: Family Medicine

## 2018-09-16 ENCOUNTER — Other Ambulatory Visit: Payer: Self-pay | Admitting: Family Medicine

## 2018-09-16 ENCOUNTER — Telehealth: Payer: Self-pay | Admitting: Family Medicine

## 2018-09-16 MED ORDER — PREDNISONE 10 MG PO TABS: 10.0000 mg | ORAL_TABLET | Freq: Every day | ORAL | 0 refills | Status: DC

## 2018-09-16 MED ORDER — GABAPENTIN 100 MG PO CAPS: 100.0000 mg | ORAL_CAPSULE | Freq: Three times a day (TID) | ORAL | 0 refills | Status: DC

## 2018-09-16 NOTE — Telephone Encounter (Signed)
Family medicine telephone note  Call patient to discuss his back and thigh pain.  Symptoms seem consistent with spinal stenosis as previously seen on lumbar CT scan back in late February.  Was given gabapentin 300 mg twice daily and prednisone 10 mg daily approximately 1 week ago at urgent care.  States that the gabapentin has been making him have "weird thoughts".  States the prednisone has been having good effect of his back.  Will change gabapentin to 100 mg 3 times daily and give additional week of prednisone.  Informed patient that prednisone could be the medication giving him weird thoughts and if he continued on the lower dose of gabapentin he is to let me know.  Gust alternative treatment options for spinal stenosis including conservative measures such as medication and rehab, progressing all the way to surgery.  Given patient's chronic medical problems and close it wishes surgery not a table at this time.  Patient to call back in 1 week if not getting much relief and will explore alternative options.  Guadalupe Dawn MD PGY-2 Family Medicine Resident

## 2018-09-16 NOTE — Telephone Encounter (Signed)
Pt returned call, unable to locate PCP.  Appt canceled for PM.  Would like a call back from provider to discuss pain and imaging results.  Wants to know if he can have something for pain until he can be referred to specialist. Christen Bame, CMA

## 2018-09-16 NOTE — Progress Notes (Signed)
Family medicine telephone note  Call patient to discuss his back and thigh pain.  Symptoms seem consistent with spinal stenosis as previously seen on lumbar CT scan back in late February.  Was given gabapentin 300 mg twice daily and prednisone 10 mg daily approximately 1 week ago at urgent care.  States that the gabapentin has been making him have "weird thoughts".  States the prednisone has been having good effect of his back.  Will change gabapentin to 100 mg 3 times daily and give additional week of prednisone.  Informed patient that prednisone could be the medication giving him weird thoughts and if he continued on the lower dose of gabapentin he is to let me know.  Gust alternative treatment options for spinal stenosis including conservative measures such as medication and rehab, progressing all the way to surgery.  Given patient's chronic medical problems and close it wishes surgery not a table at this time.  Patient to call back in 1 week if not getting much relief and will explore alternative options.  Guadalupe Dawn MD PGY-2 Family Medicine Resident

## 2018-09-16 NOTE — Telephone Encounter (Signed)
Family medicine telephone note  Call patient to discuss moving appointment to later date and again got no answer.  Left detailed voicemail stating that was planning on canceling appointment unless patient has an urgent medical issue needs to be seen.  Guadalupe Dawn MD PGY-2 Family Medicine Resident

## 2018-09-20 ENCOUNTER — Telehealth: Payer: Self-pay

## 2018-09-20 NOTE — Telephone Encounter (Signed)
Pt LVM on nurse line stating he was seen at urgent care on 3/22 and given an inhaler for cough and wheezing. Pt stated he does not feel this is working and would like a cough syrup called in instead. Please advise.

## 2018-09-21 ENCOUNTER — Other Ambulatory Visit: Payer: Self-pay

## 2018-09-21 ENCOUNTER — Telehealth (INDEPENDENT_AMBULATORY_CARE_PROVIDER_SITE_OTHER): Payer: Medicare Other | Admitting: Family Medicine

## 2018-09-21 ENCOUNTER — Telehealth: Payer: Self-pay | Admitting: Family Medicine

## 2018-09-21 DIAGNOSIS — R05 Cough: Secondary | ICD-10-CM | POA: Diagnosis not present

## 2018-09-21 DIAGNOSIS — R059 Cough, unspecified: Secondary | ICD-10-CM

## 2018-09-21 MED ORDER — AZITHROMYCIN 250 MG PO TABS: ORAL_TABLET | ORAL | 0 refills | Status: DC

## 2018-09-21 NOTE — Telephone Encounter (Signed)
Please put pt on telehealth. Deseree Kennon Holter, CMA

## 2018-09-21 NOTE — Telephone Encounter (Signed)
Patient needs to speak to someone about their bronchitis.  Says they cannot get through on our phones.  Please call them at the phone number on file.  Thank you.

## 2018-09-21 NOTE — Progress Notes (Signed)
[place] Telemedicine Visit  Patient consented to have visit conducted via telephone.  Encounter participants: Patient: [name] Mathews  Provider: Chrisandra Netters  Others (if applicable): n/a  Chief Complaint: cough  HPI:  Cough - seen at urgent care on 3/22 for cough and was prescribed tessalon perles, cetirizine, and albuterol. Cough has persisted since that time and patient feels like he is "wheezing". When asked patient what he means by wheeze, he says he can hear himself breathing but denies any high pitched whistling sounds. No fever, body aches, or sore throat. Has not had any sick contacts. He is not getting worse, just not getting any better. Cough is most bothersome when he is laying down at night. Denies shortness of breath. Denies history of asthma or COPD.  ROS: no shortness of breath, no fever, no body aches  Pertinent PMHx: history of recurrent prostate cancer, hypertension, hyperlipidemia   Exam:  Respiratory: Patient speaking normally in full sentences throughout the phone encounter, without any respiratory distress evident. Did have frequent dry sounding cough.  Assessment/Plan:  Cough - given duration and continued bothersome symptoms, I think treatment for atypical community acquired pneumonia is reasonable. He does not have other flu like symptoms (fever, body aches) to raise higher suspicion for COVID at this time. Will treat with azithromycin 5 day course. Advised patient to stay home, avoid other people. He will have his son pick up his rx and drop it off at his house. Advised I cannot say for certain whether or not this is COVID over the phone, and the safest thing is for him to avoid other people until he is feeling better since he is coughing so frequently. The "wheezing" he reports does not sound like true wheezing - reviewed urgent care note which did NOT note wheezing at time of their exam.  Encouraged patient to stay home as much as  possible to avoid exposure to COVID. Educated on emphasis of telehealth right now to minimize risk of disease transmission. Advised he call us with any concerns or needs, or if symptoms worsen. Patient agreeable to this plan & appreciative.  Time spent on phone with patient: 14 minutes   Chrisandra Netters, MD [place]

## 2018-09-22 NOTE — Telephone Encounter (Signed)
Had telemedicine visit with dr. Ardelia Mems 3/31. Coughing was addressed. Will consider resolved.  Guadalupe Dawn MD PGY-2 Family Medicine Resident

## 2018-11-02 ENCOUNTER — Other Ambulatory Visit: Payer: Self-pay

## 2018-11-02 ENCOUNTER — Inpatient Hospital Stay: Payer: Medicare Other

## 2018-11-02 ENCOUNTER — Inpatient Hospital Stay: Payer: Medicare Other | Attending: Oncology | Admitting: Oncology

## 2018-11-02 VITALS — BP 137/72 | HR 62 | Temp 99.2°F | Resp 17 | Ht 65.0 in | Wt 167.1 lb

## 2018-11-02 DIAGNOSIS — C7951 Secondary malignant neoplasm of bone: Secondary | ICD-10-CM | POA: Insufficient documentation

## 2018-11-02 DIAGNOSIS — I10 Essential (primary) hypertension: Secondary | ICD-10-CM

## 2018-11-02 DIAGNOSIS — Z5111 Encounter for antineoplastic chemotherapy: Secondary | ICD-10-CM | POA: Diagnosis present

## 2018-11-02 DIAGNOSIS — C61 Malignant neoplasm of prostate: Secondary | ICD-10-CM

## 2018-11-02 DIAGNOSIS — E291 Testicular hypofunction: Secondary | ICD-10-CM | POA: Diagnosis not present

## 2018-11-02 LAB — CBC WITH DIFFERENTIAL (CANCER CENTER ONLY)
Abs Immature Granulocytes: 0.01 10*3/uL (ref 0.00–0.07)
Basophils Absolute: 0 10*3/uL (ref 0.0–0.1)
Basophils Relative: 0 %
Eosinophils Absolute: 0.1 10*3/uL (ref 0.0–0.5)
Eosinophils Relative: 2 %
HCT: 36.7 % — ABNORMAL LOW (ref 39.0–52.0)
Hemoglobin: 11.2 g/dL — ABNORMAL LOW (ref 13.0–17.0)
Immature Granulocytes: 0 %
Lymphocytes Relative: 19 %
Lymphs Abs: 1 10*3/uL (ref 0.7–4.0)
MCH: 25.3 pg — ABNORMAL LOW (ref 26.0–34.0)
MCHC: 30.5 g/dL (ref 30.0–36.0)
MCV: 82.8 fL (ref 80.0–100.0)
Monocytes Absolute: 0.5 10*3/uL (ref 0.1–1.0)
Monocytes Relative: 9 %
Neutro Abs: 3.8 10*3/uL (ref 1.7–7.7)
Neutrophils Relative %: 70 %
Platelet Count: 235 10*3/uL (ref 150–400)
RBC: 4.43 MIL/uL (ref 4.22–5.81)
RDW: 15.8 % — ABNORMAL HIGH (ref 11.5–15.5)
WBC Count: 5.5 10*3/uL (ref 4.0–10.5)
nRBC: 0 % (ref 0.0–0.2)

## 2018-11-02 LAB — CMP (CANCER CENTER ONLY)
ALT: 11 U/L (ref 0–44)
AST: 20 U/L (ref 15–41)
Albumin: 3 g/dL — ABNORMAL LOW (ref 3.5–5.0)
Alkaline Phosphatase: 118 U/L (ref 38–126)
Anion gap: 9 (ref 5–15)
BUN: 22 mg/dL (ref 8–23)
CO2: 28 mmol/L (ref 22–32)
Calcium: 8.3 mg/dL — ABNORMAL LOW (ref 8.9–10.3)
Chloride: 108 mmol/L (ref 98–111)
Creatinine: 0.81 mg/dL (ref 0.61–1.24)
GFR, Est AFR Am: >60 mL/min (ref >60)
GFR, Estimated: >60 mL/min (ref >60)
Glucose, Bld: 102 mg/dL — ABNORMAL HIGH (ref 70–99)
Potassium: 4 mmol/L (ref 3.5–5.1)
Sodium: 145 mmol/L (ref 135–145)
Total Bilirubin: 0.4 mg/dL (ref 0.3–1.2)
Total Protein: 6.8 g/dL (ref 6.5–8.1)

## 2018-11-02 MED ORDER — DENOSUMAB 120 MG/1.7ML ~~LOC~~ SOLN
SUBCUTANEOUS | Status: AC
  Filled 2018-11-02: qty 1.7

## 2018-11-02 MED ORDER — LEUPROLIDE ACETATE (4 MONTH) 30 MG IM KIT
30.0000 mg | PACK | Freq: Once | INTRAMUSCULAR | Status: AC
  Administered 2018-11-02: 30 mg via INTRAMUSCULAR
  Filled 2018-11-02: qty 30

## 2018-11-02 MED ORDER — DENOSUMAB 120 MG/1.7ML ~~LOC~~ SOLN
120.0000 mg | Freq: Once | SUBCUTANEOUS | Status: AC
  Administered 2018-11-02: 120 mg via SUBCUTANEOUS

## 2018-11-02 NOTE — Progress Notes (Signed)
Hematology and Oncology Follow Up Visit  [name] Mathews [id number] 1945-12-01 73 y.o. 11/02/2018 10:00 AM    Principle Diagnosis: 73 year old man with castration-resistant prostate cancer with disease to the bone diagnosed in 2013.  His initial diagnosis in 2001 with Gleason score of 4+5 = 9.    Prior Therapy: 1. He was started on Lupron after attempted prostatectomy in 2001.    2. 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 He developed bony metastasis and PSA up to 67.  4. He is status post radiation therapy to the prostate fossa completed in December 2018. He received 52.5 gr 20 fractions.  Current therapy:  Zytiga 1000 mg po daily with prednisone 5 mg started in 09/2011.  Xgeva 120 mg started on 10/31/2011.   Lupron 30 mg every 4 months.    Interim History: [name] Mathews returns today for a repeat evaluation.  Since the last visit, he reports feeling well without any changes in his health.  He continues to take Zytiga without any complications.  His appetite performance status is unchanged.  He denies any worsening back pain, leg edema or excessive fatigue.  He denies any recent hospitalizations or illnesses.   He denied headaches, blurry vision, syncope or seizures.  Denies any fevers, chills or sweats.  Denied chest pain, palpitation, orthopnea or leg edema.  Denied cough, wheezing or hemoptysis.  Denied nausea, vomiting or abdominal pain.  Denies any constipation or diarrhea.  Denies any frequency urgency or hesitancy.  Denies any arthralgias or myalgias.  Denies any skin rashes or lesions.  Denies any bleeding or clotting tendency.  Denies any easy bruising.  Denies any hair or nail changes.  Denies any anxiety or depression.  Remaining review of system is negative.      Medications: I have reviewed the patient's current medications. Current Outpatient Medications  Medication Sig Dispense Refill  . albuterol (PROVENTIL HFA;VENTOLIN HFA) 108 (90 Base) MCG/ACT inhaler  Inhale 1-2 puffs into the lungs every 6 (six) hours as needed for wheezing or shortness of breath. 1 Inhaler 0  . atorvastatin (LIPITOR) 20 MG tablet Take 1 tablet (20 mg total) by mouth daily. 90 tablet 3  . azithromycin (ZITHROMAX) 250 MG tablet Take 2 tabs day 1, then 1 tab daily 6 each 0  . benzonatate (TESSALON) 100 MG capsule Take 1 capsule (100 mg total) by mouth every 8 (eight) hours. 21 capsule 0  . cetirizine (ZYRTEC) 5 MG tablet Take 1 tablet (5 mg total) by mouth daily. 30 tablet 1  . denosumab (XGEVA) 120 MG/1.7ML SOLN Inject 120 mg into the skin every 30 (thirty) days.    . DULoxetine (CYMBALTA) 60 MG capsule Take 1 capsule (60 mg total) by mouth daily. 90 capsule 3  . EPIPEN 2-PAK 0.3 MG/0.3ML SOAJ injection Reported on 01/04/2016    . gabapentin (NEURONTIN) 100 MG capsule Take 1 capsule (100 mg total) by mouth 3 (three) times daily. 90 capsule 0  . hydrochlorothiazide (HYDRODIURIL) 25 MG tablet Take 1 tablet (25 mg total) by mouth daily. 90 tablet 3  . HYDROcodone-acetaminophen (NORCO/VICODIN) 5-325 MG tablet Take 2 tablets by mouth every 6 (six) hours as needed for severe pain. 10 tablet 0  . leuprolide (LUPRON) 30 MG injection Inject 30 mg into the muscle every 4 (four) months.    . predniSONE (DELTASONE) 10 MG tablet Take 1 tablet (10 mg total) by mouth daily with breakfast. 7 tablet 0  . predniSONE (DELTASONE) 5 MG tablet TAKE 1 TABLET (5  MG TOTAL) BY MOUTH 2 (TWO) TIMES DAILY. 90 tablet 3  . rOPINIRole (REQUIP) 2 MG tablet Take 1 tablet (2 mg total) by mouth at bedtime. 90 tablet 3  . ZYTIGA 250 MG tablet TAKE 4 TABLETS BY MOUTH ONCE DAILY AS DIRECTED.  TAKE 1 HOUR BEFORE OR 2 HOURS AFTER A MEAL 120 tablet 7   No current facility-administered medications for this visit.      Allergies:  Allergies  Allergen Reactions  . Bee Venom Anaphylaxis, Shortness Of Breath and Swelling    Tongue swelling.   . Wasp Venom Anaphylaxis, Shortness Of Breath and Swelling    Tongue  swelling   . Percocet [Oxycodone-Acetaminophen] Palpitations    His past medical history was reviewed today and is unchanged.    Physical Exam:  Blood pressure 137/72, pulse 62, temperature 99.2 F (37.3 C), temperature source Oral, resp. rate 17, height 5\' 5"  (1.651 m), weight 167 lb 1.6 oz (75.8 kg), SpO2 99 %.      ECOG: 0    General appearance: Alert, awake without any distress. Head: Atraumatic without abnormalities Oropharynx: Without any thrush or ulcers. Eyes: No scleral icterus. Lymph nodes: No lymphadenopathy noted in the cervical, supraclavicular, or axillary nodes Heart:regular rate and rhythm, without any murmurs or gallops.   Lung: Clear to auscultation without any rhonchi, wheezes or dullness to percussion. Abdomin: Soft, nontender without any shifting dullness or ascites. Musculoskeletal: No clubbing or cyanosis. Neurological: No motor or sensory deficits. Skin: No rashes or lesions.       Lab Results: Lab Results  Component Value Date   WBC 7.1 08/31/2018   HGB 10.9 (L) 08/31/2018   HCT 35.6 (L) 08/31/2018   MCV 82.8 08/31/2018   PLT 229 08/31/2018     Chemistry      Component Value Date/Time   NA 140 08/31/2018 1308   NA 141 05/20/2017 1425   K 4.3 08/31/2018 1308   K 4.3 05/20/2017 1425   CL 100 08/31/2018 1308   CL 105 12/08/2012 1504   CO2 32 08/31/2018 1308   CO2 31 (H) 05/20/2017 1425   BUN 20 08/31/2018 1308   BUN 17.8 05/20/2017 1425   CREATININE 0.94 08/31/2018 1308   CREATININE 0.9 05/20/2017 1425      Component Value Date/Time   CALCIUM 8.9 08/31/2018 1308   CALCIUM 9.4 05/20/2017 1425   ALKPHOS 110 08/31/2018 1308   ALKPHOS 72 05/20/2017 1425   AST 21 08/31/2018 1308   AST 15 05/20/2017 1425   ALT 7 08/31/2018 1308   ALT <6 05/20/2017 1425   BILITOT 0.4 08/31/2018 1308   BILITOT 0.25 05/20/2017 1425       Results for [name] Mathews (MRN [id number]) as of 11/02/2018 10:02  Ref. Range 03/16/2018 09:29 05/11/2018  10:09 07/01/2018 12:14 08/31/2018 13:08  Prostate Specific Ag, Serum Latest Ref Range: 0.0 - 4.0 ng/mL 39.7 (H) 40.9 (H) 43.9 (H) 52.5 (H)       Impression and Plan:   73 year old man with:    1.  Castration-resistant prostate cancer with disease to the bone diagnosed in 2013.    He is currently on Zytiga with disease has been under reasonable control although rising PSA.  The natural course of this disease was discussed today and alternative treatment options were reviewed.  That would be in the form of Xofigo, systemic chemotherapy or continuing Zytiga depending on what his PSA status moving forward.  If his PSA continues to rise would recommend repeating staging  work-up prior to the next visit and potentially switch to different salvage therapy.  He is agreeable with this plan and will plan a repeat imaging studies before the next visit.   2. Bone directed therapy: Risks and benefits of continuing Delton See was discussed today.  Complications and include hypocalcemia and osteonecrosis of the jaw were reviewed.  He has no objections to continuing.   3. Androgen deprivation: He continues to receive androgen deprivation long-term.  He will receive Lupron today and repeated in 4 months.  Complication associated with this therapy includes osteoporosis and hot flashes.  4.  Back pain: Resolved at this time and likely related to spinal stenosis rather than malignancy.  5.  Hypertension: His blood pressure remains close to normal range.  6. Followup: He will continue to follow-up every 6 weeks for repeat evaluation.   25  minutes was spent with the patient face-to-face today.  More than 50% of time was spent on reviewing his disease status, treatment options and complications related to therapy.   Zola Button, MD 5/12/202010:00 AM

## 2018-11-02 NOTE — Progress Notes (Signed)
Calcium 8.3. Advise pt to increase calcium intake.

## 2018-11-03 ENCOUNTER — Telehealth: Payer: Self-pay | Admitting: Oncology

## 2018-11-03 LAB — PROSTATE-SPECIFIC AG, SERUM (LABCORP): Prostate Specific Ag, Serum: 74.9 ng/mL — ABNORMAL HIGH (ref 0.0–4.0)

## 2018-11-03 NOTE — Telephone Encounter (Signed)
Tried to reach regarding schedule °

## 2018-11-24 ENCOUNTER — Other Ambulatory Visit: Payer: Self-pay | Admitting: Oncology

## 2018-11-24 DIAGNOSIS — C61 Malignant neoplasm of prostate: Secondary | ICD-10-CM

## 2018-12-30 ENCOUNTER — Encounter (HOSPITAL_COMMUNITY)
Admission: RE | Admit: 2018-12-30 | Discharge: 2018-12-30 | Disposition: A | Discharge status: Home / Self Care | Payer: Medicare Other | Source: Ambulatory Visit | Attending: Oncology | Admitting: Oncology

## 2018-12-30 ENCOUNTER — Other Ambulatory Visit: Payer: Self-pay

## 2018-12-30 ENCOUNTER — Inpatient Hospital Stay: Payer: Medicare Other | Attending: Oncology

## 2018-12-30 ENCOUNTER — Ambulatory Visit (HOSPITAL_COMMUNITY)
Admission: RE | Admit: 2018-12-30 | Discharge: 2018-12-30 | Disposition: A | Discharge status: Home / Self Care | Payer: Medicare Other | Source: Ambulatory Visit | Attending: Oncology | Admitting: Oncology

## 2018-12-30 ENCOUNTER — Encounter (HOSPITAL_COMMUNITY): Payer: Self-pay

## 2018-12-30 DIAGNOSIS — Z5111 Encounter for antineoplastic chemotherapy: Secondary | ICD-10-CM | POA: Diagnosis not present

## 2018-12-30 DIAGNOSIS — C61 Malignant neoplasm of prostate: Secondary | ICD-10-CM | POA: Diagnosis not present

## 2018-12-30 DIAGNOSIS — C7951 Secondary malignant neoplasm of bone: Secondary | ICD-10-CM | POA: Diagnosis present

## 2018-12-30 DIAGNOSIS — Z5189 Encounter for other specified aftercare: Secondary | ICD-10-CM | POA: Insufficient documentation

## 2018-12-30 LAB — CMP (CANCER CENTER ONLY)
ALT: 10 U/L (ref 0–44)
AST: 24 U/L (ref 15–41)
Albumin: 2.8 g/dL — ABNORMAL LOW (ref 3.5–5.0)
Alkaline Phosphatase: 135 U/L — ABNORMAL HIGH (ref 38–126)
Anion gap: 10 (ref 5–15)
BUN: 17 mg/dL (ref 8–23)
CO2: 29 mmol/L (ref 22–32)
Calcium: 8 mg/dL — ABNORMAL LOW (ref 8.9–10.3)
Chloride: 106 mmol/L (ref 98–111)
Creatinine: 0.84 mg/dL (ref 0.61–1.24)
GFR, Est AFR Am: >60 mL/min (ref >60)
GFR, Estimated: >60 mL/min (ref >60)
Glucose, Bld: 71 mg/dL (ref 70–99)
Potassium: 4.4 mmol/L (ref 3.5–5.1)
Sodium: 145 mmol/L (ref 135–145)
Total Bilirubin: 0.2 mg/dL — ABNORMAL LOW (ref 0.3–1.2)
Total Protein: 7.2 g/dL (ref 6.5–8.1)

## 2018-12-30 LAB — CBC WITH DIFFERENTIAL (CANCER CENTER ONLY)
Abs Immature Granulocytes: 0.03 10*3/uL (ref 0.00–0.07)
Basophils Absolute: 0 10*3/uL (ref 0.0–0.1)
Basophils Relative: 1 %
Eosinophils Absolute: 0.1 10*3/uL (ref 0.0–0.5)
Eosinophils Relative: 1 %
HCT: 36.6 % — ABNORMAL LOW (ref 39.0–52.0)
Hemoglobin: 11.4 g/dL — ABNORMAL LOW (ref 13.0–17.0)
Immature Granulocytes: 1 %
Lymphocytes Relative: 17 %
Lymphs Abs: 1 10*3/uL (ref 0.7–4.0)
MCH: 25.1 pg — ABNORMAL LOW (ref 26.0–34.0)
MCHC: 31.1 g/dL (ref 30.0–36.0)
MCV: 80.6 fL (ref 80.0–100.0)
Monocytes Absolute: 0.4 10*3/uL (ref 0.1–1.0)
Monocytes Relative: 8 %
Neutro Abs: 4.1 10*3/uL (ref 1.7–7.7)
Neutrophils Relative %: 72 %
Platelet Count: 323 10*3/uL (ref 150–400)
RBC: 4.54 MIL/uL (ref 4.22–5.81)
RDW: 15.7 % — ABNORMAL HIGH (ref 11.5–15.5)
WBC Count: 5.6 10*3/uL (ref 4.0–10.5)
nRBC: 0 % (ref 0.0–0.2)

## 2018-12-30 MED ORDER — IOHEXOL 300 MG/ML  SOLN
100.0000 mL | Freq: Once | INTRAMUSCULAR | Status: AC | PRN
  Administered 2018-12-30: 100 mL via INTRAVENOUS

## 2018-12-30 MED ORDER — SODIUM CHLORIDE (PF) 0.9 % IJ SOLN
INTRAMUSCULAR | Status: AC
  Filled 2018-12-30: qty 50

## 2018-12-30 MED ORDER — TECHNETIUM TC 99M MEDRONATE IV KIT
20.6000 | PACK | Freq: Once | INTRAVENOUS | Status: AC | PRN
  Administered 2018-12-30: 20.6 via INTRAVENOUS

## 2018-12-31 LAB — PROSTATE-SPECIFIC AG, SERUM (LABCORP): Prostate Specific Ag, Serum: 100 ng/mL — ABNORMAL HIGH (ref 0.0–4.0)

## 2019-01-05 ENCOUNTER — Inpatient Hospital Stay (HOSPITAL_BASED_OUTPATIENT_CLINIC_OR_DEPARTMENT_OTHER): Payer: Medicare Other | Admitting: Oncology

## 2019-01-05 ENCOUNTER — Other Ambulatory Visit: Payer: Self-pay

## 2019-01-05 ENCOUNTER — Telehealth: Payer: Self-pay | Admitting: Oncology

## 2019-01-05 ENCOUNTER — Inpatient Hospital Stay: Payer: Medicare Other

## 2019-01-05 VITALS — BP 125/77 | HR 66 | Temp 98.3°F | Resp 17 | Ht 65.0 in | Wt 156.5 lb

## 2019-01-05 DIAGNOSIS — C7951 Secondary malignant neoplasm of bone: Secondary | ICD-10-CM | POA: Diagnosis not present

## 2019-01-05 DIAGNOSIS — M545 Low back pain: Secondary | ICD-10-CM | POA: Diagnosis not present

## 2019-01-05 DIAGNOSIS — Z7189 Other specified counseling: Secondary | ICD-10-CM

## 2019-01-05 DIAGNOSIS — Z5111 Encounter for antineoplastic chemotherapy: Secondary | ICD-10-CM | POA: Diagnosis not present

## 2019-01-05 DIAGNOSIS — E291 Testicular hypofunction: Secondary | ICD-10-CM | POA: Diagnosis not present

## 2019-01-05 DIAGNOSIS — C61 Malignant neoplasm of prostate: Secondary | ICD-10-CM

## 2019-01-05 DIAGNOSIS — R972 Elevated prostate specific antigen [PSA]: Secondary | ICD-10-CM

## 2019-01-05 DIAGNOSIS — R110 Nausea: Secondary | ICD-10-CM

## 2019-01-05 MED ORDER — DENOSUMAB 120 MG/1.7ML ~~LOC~~ SOLN
SUBCUTANEOUS | Status: AC
  Filled 2019-01-05: qty 1.7

## 2019-01-05 MED ORDER — LIDOCAINE-PRILOCAINE 2.5-2.5 % EX CREA: 1.0000 "application " | TOPICAL_CREAM | CUTANEOUS | 0 refills | Status: DC | PRN

## 2019-01-05 MED ORDER — DENOSUMAB 120 MG/1.7ML ~~LOC~~ SOLN
120.0000 mg | Freq: Once | SUBCUTANEOUS | Status: AC
  Administered 2019-01-05: 10:00:00 120 mg via SUBCUTANEOUS

## 2019-01-05 MED ORDER — PROCHLORPERAZINE MALEATE 10 MG PO TABS: 10.0000 mg | ORAL_TABLET | Freq: Four times a day (QID) | ORAL | 0 refills | Status: DC | PRN

## 2019-01-05 NOTE — Progress Notes (Signed)
START ON PATHWAY REGIMEN - Prostate     A cycle is every 21 days:     Docetaxel      Prednisone   **Always confirm dose/schedule in your pharmacy ordering system**  Patient Characteristics: Adenocarcinoma, Distant Metastases, Castration Resistant, Symptomatic, Docetaxel Eligible Histology: Adenocarcinoma Therapeutic Status: Distant Metastases  Intent of Therapy: Non-Curative / Palliative Intent, Discussed with Patient 

## 2019-01-05 NOTE — Patient Instructions (Signed)
Denosumab injection What is this medicine? DENOSUMAB (den oh sue mab) slows bone breakdown. Prolia is used to treat osteoporosis in women after menopause and in men, and in people who are taking corticosteroids for 6 months or more. Xgeva is used to treat a high calcium level due to cancer and to prevent bone fractures and other bone problems caused by multiple myeloma or cancer bone metastases. Xgeva is also used to treat giant cell tumor of the bone. This medicine may be used for other purposes; ask your health care provider or pharmacist if you have questions. COMMON BRAND NAME(S): Prolia, XGEVA What should I tell my health care provider before I take this medicine? They need to know if you have any of these conditions:  dental disease  having surgery or tooth extraction  infection  kidney disease  low levels of calcium or Vitamin D in the blood  malnutrition  on hemodialysis  skin conditions or sensitivity  thyroid or parathyroid disease  an unusual reaction to denosumab, other medicines, foods, dyes, or preservatives  pregnant or trying to get pregnant  breast-feeding How should I use this medicine? This medicine is for injection under the skin. It is given by a health care professional in a hospital or clinic setting. A special MedGuide will be given to you before each treatment. Be sure to read this information carefully each time. For Prolia, talk to your pediatrician regarding the use of this medicine in children. Special care may be needed. For Xgeva, talk to your pediatrician regarding the use of this medicine in children. While this drug may be prescribed for children as young as 13 years for selected conditions, precautions do apply. Overdosage: If you think you have taken too much of this medicine contact a poison control center or emergency room at once. NOTE: This medicine is only for you. Do not share this medicine with others. What if I miss a dose? It is  important not to miss your dose. Call your doctor or health care professional if you are unable to keep an appointment. What may interact with this medicine? Do not take this medicine with any of the following medications:  other medicines containing denosumab This medicine may also interact with the following medications:  medicines that lower your chance of fighting infection  steroid medicines like prednisone or cortisone This list may not describe all possible interactions. Give your health care provider a list of all the medicines, herbs, non-prescription drugs, or dietary supplements you use. Also tell them if you smoke, drink alcohol, or use illegal drugs. Some items may interact with your medicine. What should I watch for while using this medicine? Visit your doctor or health care professional for regular checks on your progress. Your doctor or health care professional may order blood tests and other tests to see how you are doing. Call your doctor or health care professional for advice if you get a fever, chills or sore throat, or other symptoms of a cold or flu. Do not treat yourself. This drug may decrease your body's ability to fight infection. Try to avoid being around people who are sick. You should make sure you get enough calcium and vitamin D while you are taking this medicine, unless your doctor tells you not to. Discuss the foods you eat and the vitamins you take with your health care professional. See your dentist regularly. Brush and floss your teeth as directed. Before you have any dental work done, tell your dentist you are   receiving this medicine. Do not become pregnant while taking this medicine or for 5 months after stopping it. Talk with your doctor or health care professional about your birth control options while taking this medicine. Women should inform their doctor if they wish to become pregnant or think they might be pregnant. There is a potential for serious side  effects to an unborn child. Talk to your health care professional or pharmacist for more information. What side effects may I notice from receiving this medicine? Side effects that you should report to your doctor or health care professional as soon as possible:  allergic reactions like skin rash, itching or hives, swelling of the face, lips, or tongue  bone pain  breathing problems  dizziness  jaw pain, especially after dental work  redness, blistering, peeling of the skin  signs and symptoms of infection like fever or chills; cough; sore throat; pain or trouble passing urine  signs of low calcium like fast heartbeat, muscle cramps or muscle pain; pain, tingling, numbness in the hands or feet; seizures  unusual bleeding or bruising  unusually weak or tired Side effects that usually do not require medical attention (report to your doctor or health care professional if they continue or are bothersome):  constipation  diarrhea  headache  joint pain  loss of appetite  muscle pain  runny nose  tiredness  upset stomach This list may not describe all possible side effects. Call your doctor for medical advice about side effects. You may report side effects to FDA at 1-800-FDA-1088. Where should I keep my medicine? This medicine is only given in a clinic, doctor's office, or other health care setting and will not be stored at home. NOTE: This sheet is a summary. It may not cover all possible information. If you have questions about this medicine, talk to your doctor, pharmacist, or health care provider.  2020 [employer] (2017-10-16 16:10:44)

## 2019-01-05 NOTE — Progress Notes (Signed)
Hematology and Oncology Follow Up Visit  [name] Mathews [id number] January 06, 1946 73 y.o. 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    Prior Therapy: 1. He was started on Lupron after attempted prostatectomy in 2001.    2. 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 He developed bony metastasis and PSA up to 110.  4. He is status post radiation therapy to the prostate fossa completed in December 2018. He received 52.5 gr 20 fractions.  Current therapy:  Zytiga 1000 mg po daily with prednisone 5 mg started in 09/2011.  Xgeva 120 mg started on 10/31/2011.   Lupron 30 mg every 4 months.    Interim History: [name] Mathews is here for a follow-up visit.  Since the last visit, he reports no major changes in his health.  He continues to tolerate Zytiga although he is reporting some more nausea in the morning.  He denies any vomiting or lower extremity edema.  He denies any worsening pain or discomfort.  He denies any respiratory complaints.  He remains active and attends activities of daily living.   Patient denied any alteration mental status, neuropathy, confusion or dizziness.  Denies any headaches or lethargy.  Denies any night sweats, weight loss or changes in appetite.  Denied orthopnea, dyspnea on exertion or chest discomfort.  Denies shortness of breath, difficulty breathing hemoptysis or cough.  Denies any abdominal distention, nausea, early satiety or dyspepsia.  Denies any hematuria, frequency, dysuria or nocturia.  Denies any skin irritation, dryness or rash.  Denies any ecchymosis or petechiae.  Denies any lymphadenopathy or clotting.  Denies any heat or cold intolerance.  Denies any anxiety or depression.  Remaining review of system is negative.      Medications: No changes in  medication by my review today. Current Outpatient Medications  Medication Sig Dispense Refill  . albuterol (PROVENTIL HFA;VENTOLIN HFA) 108 (90 Base) MCG/ACT inhaler Inhale 1-2 puffs into the lungs every 6 (six) hours as needed for wheezing or shortness of breath. 1 Inhaler 0  . atorvastatin (LIPITOR) 20 MG tablet Take 1 tablet (20 mg total) by mouth daily. 90 tablet 3  . azithromycin (ZITHROMAX) 250 MG tablet Take 2 tabs day 1, then 1 tab daily 6 each 0  . benzonatate (TESSALON) 100 MG capsule Take 1 capsule (100 mg total) by mouth every 8 (eight) hours. 21 capsule 0  . cetirizine (ZYRTEC) 5 MG tablet Take 1 tablet (5 mg total) by mouth daily. 30 tablet 1  . denosumab (XGEVA) 120 MG/1.7ML SOLN Inject 120 mg into the skin every 30 (thirty) days.    . DULoxetine (CYMBALTA) 60 MG capsule Take 1 capsule (60 mg total) by mouth daily. 90 capsule 3  . EPIPEN 2-PAK 0.3 MG/0.3ML SOAJ injection Reported on 01/04/2016    . gabapentin (NEURONTIN) 100 MG capsule Take 1 capsule (100 mg total) by mouth 3 (three) times daily. 90 capsule 0  . hydrochlorothiazide (HYDRODIURIL) 25 MG tablet Take 1 tablet (25 mg total) by mouth daily. 90 tablet 3  . HYDROcodone-acetaminophen (NORCO/VICODIN) 5-325 MG tablet Take 2 tablets by mouth every 6 (six) hours as needed for severe pain. 10 tablet 0  . leuprolide (LUPRON) 30 MG injection Inject 30 mg into the muscle every 4 (four) months.    . predniSONE (DELTASONE) 10 MG  tablet Take 1 tablet (10 mg total) by mouth daily with breakfast. 7 tablet 0  . predniSONE (DELTASONE) 5 MG tablet TAKE 1 TABLET (5 MG TOTAL) BY MOUTH 2 (TWO) TIMES DAILY. 90 tablet 3  . rOPINIRole (REQUIP) 2 MG tablet Take 1 tablet (2 mg total) by mouth at bedtime. 90 tablet 3  . ZYTIGA 250 MG tablet TAKE 4 TABLETS BY MOUTH ONCE DAILY AS DIRECTED.  TAKE 1 HOUR BEFORE OR 2 HOURS AFTER A MEAL 120 tablet 11   No current facility-administered medications for this visit.      Allergies:  Allergies  Allergen  Reactions  . Bee Venom Anaphylaxis, Shortness Of Breath and Swelling    Tongue swelling.   . Wasp Venom Anaphylaxis, Shortness Of Breath and Swelling    Tongue swelling   . Percocet [Oxycodone-Acetaminophen] Palpitations    His past medical history, social history and family history were updated without any changes today.    Physical Exam:   Blood pressure 125/77, pulse 66, temperature 98.3 F (36.8 C), temperature source Oral, resp. rate 17, height 5\' 5"  (1.651 m), weight 156 lb 8 oz (71 kg), SpO2 99 %.     ECOG: 0   General appearance: Comfortable appearing without any discomfort Head: Normocephalic without any trauma Oropharynx: Mucous membranes are moist and pink without any thrush or ulcers. Eyes: Pupils are equal and round reactive to light. Lymph nodes: No cervical, supraclavicular, inguinal or axillary lymphadenopathy.   Heart:regular rate and rhythm.  S1 and S2 without leg edema. Lung: Clear without any rhonchi or wheezes.  No dullness to percussion. Abdomin: Soft, nontender, nondistended with good bowel sounds.  No hepatosplenomegaly. Musculoskeletal: No joint deformity or effusion.  Full range of motion noted. Neurological: No deficits noted on motor, sensory and deep tendon reflex exam. Skin: No petechial rash or dryness.  Appeared moist.         Lab Results: Lab Results  Component Value Date   WBC 5.6 12/30/2018   HGB 11.4 (L) 12/30/2018   HCT 36.6 (L) 12/30/2018   MCV 80.6 12/30/2018   PLT 323 12/30/2018     Chemistry      Component Value Date/Time   NA 145 12/30/2018 1135   NA 141 05/20/2017 1425   K 4.4 12/30/2018 1135   K 4.3 05/20/2017 1425   CL 106 12/30/2018 1135   CL 105 12/08/2012 1504   CO2 29 12/30/2018 1135   CO2 31 (H) 05/20/2017 1425   BUN 17 12/30/2018 1135   BUN 17.8 05/20/2017 1425   CREATININE 0.84 12/30/2018 1135   CREATININE 0.9 05/20/2017 1425      Component Value Date/Time   CALCIUM 8.0 (L) 12/30/2018 1135    CALCIUM 9.4 05/20/2017 1425   ALKPHOS 135 (H) 12/30/2018 1135   ALKPHOS 72 05/20/2017 1425   AST 24 12/30/2018 1135   AST 15 05/20/2017 1425   ALT 10 12/30/2018 1135   ALT <6 05/20/2017 1425   BILITOT 0.2 (L) 12/30/2018 1135   BILITOT 0.25 05/20/2017 1425       Results for DONG, NIMMONS (MRN [id number]) as of 01/05/2019 09:08  Ref. Range 08/31/2018 13:08 11/02/2018 09:38 12/30/2018 11:35  Prostate Specific Ag, Serum Latest Ref Range: 0.0 - 4.0 ng/mL 52.5 (H) 74.9 (H) 100.0 (H)    EXAM: CT ABDOMEN AND PELVIS WITH CONTRAST  TECHNIQUE: Multidetector CT imaging of the abdomen and pelvis was performed using the standard protocol following bolus administration of intravenous contrast.  CONTRAST:  140mL  OMNIPAQUE IOHEXOL 300 MG/ML  SOLN  COMPARISON:  05/11/2018 CT abdomen/pelvis.  FINDINGS: Lower chest: There is patchy nodular thickening of the fissures and interlobular septae at both lung bases, significantly increased since 05/11/2018, compatible with lymphangitic carcinomatosis.  Hepatobiliary: Normal liver size. No liver mass. Normal gallbladder with no radiopaque cholelithiasis. No biliary ductal dilatation.  Pancreas: Normal, with no mass or duct dilation.  Spleen: Normal size. No mass.  Adrenals/Urinary Tract: Normal adrenals. Normal kidneys with no hydronephrosis and no renal mass. Chronic diffuse bladder wall thickening is unchanged. No significant bladder distention. No bladder stands.  Stomach/Bowel: Normal non-distended stomach. Normal caliber small bowel with no small bowel wall thickening. Normal appendix. Moderate sigmoid diverticulosis, with no large bowel wall thickening or significant pericolonic fat stranding. Oral contrast transits to the distal colon.  Vascular/Lymphatic: Atherosclerotic nonaneurysmal abdominal aorta. Patent portal, splenic, hepatic and renal veins. Enlarged 1.3 cm posterior aortocaval node (series 2/image 34), increased from  1.0 cm. Enlarged 1.3 cm left common iliac node (series 2/image 46), increased from 1.0 cm.  Reproductive: Mildly enlarged heterogeneously hyperenhancing prostate gland with mass-effect on the bladder base by the enlarged nodular median lobe of the prostate, not appreciably changed.  Other: No pneumoperitoneum, ascites or focal fluid collection.  Musculoskeletal: Widespread patchy sclerotic osseous lesions throughout the visualized skeleton, several of which have increased in size. For example left posterior L4 3.2 cm sclerotic lesion (series 2/image 37), increased from 2.0 cm. Left femoral neck 2.3 cm sclerotic lesion (series 2/image 74), increased from 0.8 cm. S2 2.3 cm sclerotic sacral lesion (series 6/image 65), increased from 1.8 cm. Marked lumbar spondylosis. Marked bilateral gynecomastia, partially visualized.  IMPRESSION: 1. 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 2. Interval progression of widespread patchy sclerotic osseous metastatic disease throughout the skeleton. 3. Mild interval progression of retroperitoneal/left common iliac adenopathy. 4. Stable mildly enlarged heterogeneous hyperenhancing prostate gland. 5.  Aortic Atherosclerosis (ICD10-I70.0).     Impression and Plan:   73 year old man with:    1.  Advanced prostate cancer with disease to the bone and lymphadenopathy that is currently castration-resistant.   He is on Zytiga although his PSA continues to rise indicating disease progression.  CT scan and bone scan obtained on 12/30/2018 were personally reviewed and discussed with the patient today.  He has clear progression of disease at this time is evident by his worsening lymphadenopathy, bone disease among others.  Treatment options were reviewed to include systemic chemotherapy utilizing Taxotere versus switching to a different oral targeted therapy.  I feel given his disease progression he would benefit from systemic  chemotherapy at this time.  Complication associated with his treatment include nausea, vomiting, myelosuppression, neutropenia and neutropenic sepsis.  After discussion is agreeable to proceed at this time.   2. Bone directed therapy: I recommended continuing Xgeva every 6 weeks.  Complications include osteonecrosis of the jaw and hypocalcemia were reiterated.   3. Androgen deprivation: He will continue Lupron every 4 months his next injection will be after March 05, 2019.  4.  Back pain: Reasonably controlled at this time without any issues.  5.  IV access: Risks and benefits of Port-A-Cath insertion was discussed today.  Potential complications including thrombosis, bleeding and infection were reviewed and is agreeable to proceed.  6.  Antiemetics: Prescription for Compazine will be available to him.  7.  Growth factor support: He will receive growth factor support after each cycle of therapy.  He is at risk of developing neutropenia and neutropenic  sepsis.  8. Followup: We will be in the immediate future to start chemotherapy.   25  minutes was spent with the patient face-to-face today.  More than 50% of time was dedicated to reviewing his disease status update, reviewing imaging studies, treatment options and complications related therapy.Zola Button, MD 7/15/20209:07 AM

## 2019-01-05 NOTE — Progress Notes (Signed)
Per Dr Alen Blew PT is okay to receive the injection instruct PT to take calcium (tums) if he isn't already.

## 2019-01-05 NOTE — Telephone Encounter (Signed)
Gave avs and calendar ° °

## 2019-01-06 ENCOUNTER — Telehealth: Payer: Self-pay | Admitting: Oncology

## 2019-01-06 NOTE — Telephone Encounter (Signed)
Confirmed appt/verified info °

## 2019-01-07 ENCOUNTER — Inpatient Hospital Stay: Payer: Medicare Other

## 2019-01-07 ENCOUNTER — Telehealth: Payer: Self-pay

## 2019-01-07 ENCOUNTER — Other Ambulatory Visit: Payer: Self-pay

## 2019-01-07 NOTE — Telephone Encounter (Signed)
Contacted the patient and made him aware to take prednisone 5mg  po daily. Patient verbalized understanding and had no other questions or concerns.

## 2019-01-07 NOTE — Telephone Encounter (Signed)
-----   Message from [name], MD sent at 01/07/2019 12:21 PM EDT ----- Regarding: RE: prednisone He is continue prednisone 5 mg daily. Thanks  ----- Message ----- From: Jesse Fall, RN Sent: 01/07/2019  12:05 PM EDT To: [facility] Subject: prednisone                                     Pt has been on prednisone 5 mg bid since 2013.  He is still taking Prednisone.  Your script says to start 02/13/19.  Does he need to taper off & restart 8/23 or just continue?  Please have your nurse to let him know. Thanks, [employer]

## 2019-01-09 ENCOUNTER — Other Ambulatory Visit: Payer: Self-pay | Admitting: Radiology

## 2019-01-10 ENCOUNTER — Other Ambulatory Visit: Payer: Self-pay | Admitting: Oncology

## 2019-01-11 ENCOUNTER — Other Ambulatory Visit: Payer: Self-pay

## 2019-01-11 ENCOUNTER — Other Ambulatory Visit: Payer: Self-pay | Admitting: Oncology

## 2019-01-11 ENCOUNTER — Ambulatory Visit (HOSPITAL_COMMUNITY)
Admission: RE | Admit: 2019-01-11 | Discharge: 2019-01-11 | Disposition: A | Discharge status: Home / Self Care | Payer: Medicare Other | Source: Ambulatory Visit | Attending: Oncology | Admitting: Oncology

## 2019-01-11 ENCOUNTER — Encounter (HOSPITAL_COMMUNITY): Payer: Self-pay | Admitting: Interventional Radiology

## 2019-01-11 DIAGNOSIS — G2581 Restless legs syndrome: Secondary | ICD-10-CM | POA: Insufficient documentation

## 2019-01-11 DIAGNOSIS — Z8249 Family history of ischemic heart disease and other diseases of the circulatory system: Secondary | ICD-10-CM | POA: Diagnosis not present

## 2019-01-11 DIAGNOSIS — E785 Hyperlipidemia, unspecified: Secondary | ICD-10-CM | POA: Insufficient documentation

## 2019-01-11 DIAGNOSIS — Z8049 Family history of malignant neoplasm of other genital organs: Secondary | ICD-10-CM | POA: Diagnosis not present

## 2019-01-11 DIAGNOSIS — Z87891 Personal history of nicotine dependence: Secondary | ICD-10-CM | POA: Insufficient documentation

## 2019-01-11 DIAGNOSIS — Z79899 Other long term (current) drug therapy: Secondary | ICD-10-CM | POA: Diagnosis not present

## 2019-01-11 DIAGNOSIS — Z823 Family history of stroke: Secondary | ICD-10-CM | POA: Diagnosis not present

## 2019-01-11 DIAGNOSIS — C61 Malignant neoplasm of prostate: Secondary | ICD-10-CM

## 2019-01-11 DIAGNOSIS — I10 Essential (primary) hypertension: Secondary | ICD-10-CM | POA: Insufficient documentation

## 2019-01-11 DIAGNOSIS — Z885 Allergy status to narcotic agent status: Secondary | ICD-10-CM | POA: Insufficient documentation

## 2019-01-11 DIAGNOSIS — C7951 Secondary malignant neoplasm of bone: Secondary | ICD-10-CM | POA: Insufficient documentation

## 2019-01-11 HISTORY — PX: IR IMAGING GUIDED PORT INSERTION: IMG5740

## 2019-01-11 LAB — CBC WITH DIFFERENTIAL/PLATELET
Abs Immature Granulocytes: 0.02 10*3/uL (ref 0.00–0.07)
Basophils Absolute: 0 10*3/uL (ref 0.0–0.1)
Basophils Relative: 0 %
Eosinophils Absolute: 0.1 10*3/uL (ref 0.0–0.5)
Eosinophils Relative: 1 %
HCT: 37.8 % — ABNORMAL LOW (ref 39.0–52.0)
Hemoglobin: 11.5 g/dL — ABNORMAL LOW (ref 13.0–17.0)
Immature Granulocytes: 0 %
Lymphocytes Relative: 18 %
Lymphs Abs: 1.2 10*3/uL (ref 0.7–4.0)
MCH: 25.6 pg — ABNORMAL LOW (ref 26.0–34.0)
MCHC: 30.4 g/dL (ref 30.0–36.0)
MCV: 84.2 fL (ref 80.0–100.0)
Monocytes Absolute: 0.5 10*3/uL (ref 0.1–1.0)
Monocytes Relative: 8 %
Neutro Abs: 4.9 10*3/uL (ref 1.7–7.7)
Neutrophils Relative %: 73 %
Platelets: 289 10*3/uL (ref 150–400)
RBC: 4.49 MIL/uL (ref 4.22–5.81)
RDW: 15.6 % — ABNORMAL HIGH (ref 11.5–15.5)
WBC: 6.7 10*3/uL (ref 4.0–10.5)
nRBC: 0 % (ref 0.0–0.2)

## 2019-01-11 LAB — PROTIME-INR
INR: 1 (ref 0.8–1.2)
Prothrombin Time: 13.4 seconds (ref 11.4–15.2)

## 2019-01-11 MED ORDER — LIDOCAINE HCL 1 % IJ SOLN
INTRAMUSCULAR | Status: AC
  Filled 2019-01-11: qty 20

## 2019-01-11 MED ORDER — CEFAZOLIN SODIUM-DEXTROSE 2-4 GM/100ML-% IV SOLN
2.0000 g | INTRAVENOUS | Status: AC
  Administered 2019-01-11: 2 g via INTRAVENOUS

## 2019-01-11 MED ORDER — MIDAZOLAM HCL 2 MG/2ML IJ SOLN
INTRAMUSCULAR | Status: AC | PRN
  Administered 2019-01-11 (×2): 1 mg via INTRAVENOUS

## 2019-01-11 MED ORDER — FENTANYL CITRATE (PF) 100 MCG/2ML IJ SOLN
INTRAMUSCULAR | Status: AC | PRN
  Administered 2019-01-11 (×2): 50 ug via INTRAVENOUS

## 2019-01-11 MED ORDER — HEPARIN SOD (PORK) LOCK FLUSH 100 UNIT/ML IV SOLN
INTRAVENOUS | Status: AC
  Filled 2019-01-11: qty 5

## 2019-01-11 MED ORDER — SODIUM CHLORIDE 0.9 % IV SOLN
INTRAVENOUS | Status: DC
  Administered 2019-01-11: 13:00:00 via INTRAVENOUS

## 2019-01-11 MED ORDER — HEPARIN SOD (PORK) LOCK FLUSH 100 UNIT/ML IV SOLN
INTRAVENOUS | Status: AC | PRN
  Administered 2019-01-11: 500 [IU] via INTRAVENOUS

## 2019-01-11 MED ORDER — FENTANYL CITRATE (PF) 100 MCG/2ML IJ SOLN
INTRAMUSCULAR | Status: AC
  Filled 2019-01-11: qty 2

## 2019-01-11 MED ORDER — LIDOCAINE HCL (PF) 1 % IJ SOLN
INTRAMUSCULAR | Status: AC | PRN
  Administered 2019-01-11: 10 mL

## 2019-01-11 MED ORDER — MIDAZOLAM HCL 2 MG/2ML IJ SOLN
INTRAMUSCULAR | Status: AC
  Filled 2019-01-11: qty 4

## 2019-01-11 MED ORDER — CEFAZOLIN SODIUM-DEXTROSE 2-4 GM/100ML-% IV SOLN
INTRAVENOUS | Status: AC
  Administered 2019-01-11: 2 g via INTRAVENOUS
  Filled 2019-01-11: qty 100

## 2019-01-11 MED ORDER — LIDOCAINE HCL (PF) 1 % IJ SOLN
INTRAMUSCULAR | Status: AC | PRN
  Administered 2019-01-11: 5 mL

## 2019-01-11 NOTE — Discharge Instructions (Signed)
DO NOT USE EMLA CREAM FOR 2 WEEKS   Implanted Port Insertion, Care After This sheet gives you information about how to care for yourself after your procedure. Your health care provider may also give you more specific instructions. If you have problems or questions, contact your health care provider. What can I expect after the procedure? After the procedure, it is common to have:  Discomfort at the port insertion site.  Bruising on the skin over the port. This should improve over 3-4 days. Follow these instructions at home: [facility] care  After your port is placed, you will get a manufacturer's information card. The card has information about your port. Keep this card with you at all times.  Take care of the port as told by your health care provider. Ask your health care provider if you or a family member can get training for taking care of the port at home. A home health care nurse may also take care of the port.  Make sure to remember what type of port you have. Incision care      Follow instructions from your health care provider about how to take care of your port insertion site. Make sure you: ? Wash your hands with soap and water before and after you change your bandage (dressing). If soap and water are not available, use hand sanitizer. ? Change your dressing as told by your health care provider. ? Leave stitches (sutures), skin glue, or adhesive strips in place. These skin closures may need to stay in place for 2 weeks or longer. If adhesive strip edges start to loosen and curl up, you may trim the loose edges. Do not remove adhesive strips completely unless your health care provider tells you to do that.  Check your port insertion site every day for signs of infection. Check for: ? Redness, swelling, or pain. ? Fluid or blood. ? Warmth. ? Pus or a bad smell. Activity  Return to your normal activities as told by your health care provider. Ask your health care provider what  activities are safe for you.  Do not lift anything that is heavier than 10 lb (4.5 kg), or the limit that you are told, until your health care provider says that it is safe. General instructions  Take over-the-counter and prescription medicines only as told by your health care provider.  Do not take baths, swim, or use a hot tub until your health care provider approves. Ask your health care provider if you may take showers. You may only be allowed to take sponge baths.  Do not drive for 24 hours if you were given a sedative during your procedure.  Wear a medical alert bracelet in case of an emergency. This will tell any health care providers that you have a port.  Keep all follow-up visits as told by your health care provider. This is important. Contact a health care provider if:  You cannot flush your port with saline as directed, or you cannot draw blood from the port.  You have a fever or chills.  You have redness, swelling, or pain around your port insertion site.  You have fluid or blood coming from your port insertion site.  Your port insertion site feels warm to the touch.  You have pus or a bad smell coming from the port insertion site. Get help right away if:  You have chest pain or shortness of breath.  You have bleeding from your port that you cannot control. Summary  Take care of the port as told by your health care provider. Keep the manufacturer's information card with you at all times.  Change your dressing as told by your health care provider.  Contact a health care provider if you have a fever or chills or if you have redness, swelling, or pain around your port insertion site.  Keep all follow-up visits as told by your health care provider. This information is not intended to replace advice given to you by your health care provider. Make sure you discuss any questions you have with your health care provider. Document Released: 03/30/2013 Document Revised:  01/05/2018 Document Reviewed: 01/05/2018 Elsevier Patient Education  [place]. Moderate Conscious Sedation, Adult, Care After These instructions provide you with information about caring for yourself after your procedure. Your health care provider may also give you more specific instructions. Your treatment has been planned according to current medical practices, but problems sometimes occur. Call your health care provider if you have any problems or questions after your procedure. What can I expect after the procedure? After your procedure, it is common:  To feel sleepy for several hours.  To feel clumsy and have poor balance for several hours.  To have poor judgment for several hours.  To vomit if you eat too soon. Follow these instructions at home: For at least 24 hours after the procedure:   Do not: ? Participate in activities where you could fall or become injured. ? Drive. ? Use heavy machinery. ? Drink alcohol. ? Take sleeping pills or medicines that cause drowsiness. ? Make important decisions or sign legal documents. ? Take care of children on your own.  Rest. Eating and drinking  Follow the diet recommended by your health care provider.  If you vomit: ? Drink water, juice, or soup when you can drink without vomiting. ? Make sure you have little or no nausea before eating solid foods. General instructions  Have a responsible adult stay with you until you are awake and alert.  Take over-the-counter and prescription medicines only as told by your health care provider.  If you smoke, do not smoke without supervision.  Keep all follow-up visits as told by your health care provider. This is important. Contact a health care provider if:  You keep feeling nauseous or you keep vomiting.  You feel light-headed.  You develop a rash.  You have a fever. Get help right away if:  You have trouble breathing. This information is not intended to replace  advice given to you by your health care provider. Make sure you discuss any questions you have with your health care provider. Document Released: 03/30/2013 Document Revised: 05/22/2017 Document Reviewed: 09/29/2015 Elsevier Patient Education  2020 [employer].

## 2019-01-11 NOTE — Procedures (Signed)
Interventional Radiology Procedure Note  Procedure: Placement of a right IJ approach single lumen PowerPort.  Tip is positioned at the superior cavoatrial junction and catheter is ready for immediate use.  Complications: None Recommendations:  - Ok to shower tomorrow - Do not submerge for 7 days - Routine line care   Signed,  Tyqwan Pink S. Joeangel Jeanpaul, DO   

## 2019-01-11 NOTE — Consult Note (Signed)
Chief Complaint: Patient was seen in consultation today for Port-A-Cath placement  Referring Physician(s): [name]  Supervising Physician: Corrie Mckusick  Patient Status: [name]  History of Present Illness: [name] Mathews is a 73 y.o. male with history of advanced prostate carcinoma, initially diagnosed in 2001, status post prior surgery and radiation therapy.  He is currently on [place], Lupron and [place].  He now has increasing PSA and disease progression on imaging and presents today for Port-A-Cath placement for additional treatment.  Past Medical History:  Diagnosis Date   Hyperlipidemia    Hypertension    Prostate cancer ([place])    metastasis bones   Restless leg syndrome     Past Surgical History:  Procedure Laterality Date   PELVIC LYMPH NODE DISSECTION     prostate remains intact    Allergies: Bee venom, Wasp venom, and Percocet [oxycodone-acetaminophen]  Medications: Prior to Admission medications   Medication Sig Start Date End Date Taking? Authorizing Provider  atorvastatin (LIPITOR) 20 MG tablet Take 1 tablet (20 mg total) by mouth daily. 01/12/18  Yes Guadalupe Dawn, MD  benzonatate (TESSALON) 100 MG capsule Take 1 capsule (100 mg total) by mouth every 8 (eight) hours. 09/12/18  Yes Bast, Traci A, NP  cetirizine (ZYRTEC) 5 MG tablet Take 1 tablet (5 mg total) by mouth daily. 09/12/18  Yes Bast, Traci A, NP  denosumab (XGEVA) 120 MG/1.7ML SOLN Inject 120 mg into the skin every 30 (thirty) days.   Yes [provider]  DULoxetine (CYMBALTA) 60 MG capsule Take 1 capsule (60 mg total) by mouth daily. 05/10/18  Yes Guadalupe Dawn, MD  gabapentin (NEURONTIN) 100 MG capsule Take 1 capsule (100 mg total) by mouth 3 (three) times daily. 09/16/18  Yes Guadalupe Dawn, MD  hydrochlorothiazide (HYDRODIURIL) 25 MG tablet Take 1 tablet (25 mg total) by mouth daily. 09/08/18  Yes Lockamy, Timothy, DO  leuprolide (LUPRON) 30 MG injection Inject 30 mg  into the muscle every 4 (four) months.   Yes [provider]  predniSONE (DELTASONE) 10 MG tablet Take 1 tablet (10 mg total) by mouth daily with breakfast. 09/16/18  Yes Guadalupe Dawn, MD  predniSONE (DELTASONE) 5 MG tablet TAKE 1 TABLET (5 MG TOTAL) BY MOUTH 2 (TWO) TIMES DAILY. 02/12/18  Yes [name], MD  rOPINIRole (REQUIP) 2 MG tablet Take 1 tablet (2 mg total) by mouth at bedtime. 05/10/18  Yes Guadalupe Dawn, MD  albuterol (PROVENTIL HFA;VENTOLIN HFA) 108 (90 Base) MCG/ACT inhaler Inhale 1-2 puffs into the lungs every 6 (six) hours as needed for wheezing or shortness of breath. 09/12/18   Loura Halt A, NP  azithromycin (ZITHROMAX) 250 MG tablet Take 2 tabs day 1, then 1 tab daily 09/21/18   Leeanne Rio, MD  EPIPEN 2-PAK 0.3 MG/0.3ML SOAJ injection Reported on 01/04/2016 10/27/13   [provider]  HYDROcodone-acetaminophen (NORCO/VICODIN) 5-325 MG tablet Take 2 tablets by mouth every 6 (six) hours as needed for severe pain. 08/15/18   Julianne Rice, MD  lidocaine-prilocaine (EMLA) cream Apply 1 application topically as needed. 01/05/19   [name], MD  prochlorperazine (COMPAZINE) 10 MG tablet Take 1 tablet (10 mg total) by mouth every 6 (six) hours as needed for nausea or vomiting. 01/05/19   [name], [name], MD  ZYTIGA 250 MG tablet TAKE 4 TABLETS BY MOUTH ONCE DAILY AS DIRECTED.  TAKE 1 HOUR BEFORE OR 2 HOURS AFTER A MEAL 11/24/18   [name], MD  Family History  Problem Relation Age of Onset   Heart attack Mother 68       died of heart attack   Cervical cancer Sister 57   Stroke Brother 54    Social History   Socioeconomic History   Marital status: Widowed    Spouse name: Not on file   Number of children: Not on file   Years of education: Not on file   Highest education level: Not on file  Occupational History   Occupation: retired Chemical engineer: [employer]    Comment: retired  Armed forces operational officer strain: Not on file   Food insecurity    Worry: Not on file    Inability: Not on Lexicographer needs    Medical: Not on file    Non-medical: Not on file  Tobacco Use   Smoking status: Former Smoker    Packs/day: 0.25    Years: 6.00    Pack years: 1.50    Start date: 1969    Quit date: 1975    Years since quitting: 45.5   Smokeless tobacco: Never Used  Substance and Sexual Activity   Alcohol use: No   Drug use: No   Sexual activity: Never  Lifestyle   Physical activity    Days per week: Not on file    Minutes per session: Not on file   Stress: Not on file  Relationships   Social connections    Talks on phone: Not on file    Gets together: Not on file    Attends religious service: Not on file    Active member of club or organization: Not on file    Attends meetings of clubs or organizations: Not on file    Relationship status: Not on file  Other Topics Concern   Not on file  Social History Narrative   Walks about 0.5 a day. Play a lot of golf. Widowed. Surrounded by his children and grandchildren. Has a Careers adviser, Felix Ahmadi.      Review of Systems currently denies fever, headache, chest pain, dyspnea, cough, abdominal/back pain, nausea, vomiting or bleeding  Vital Signs: BP 136/78    Pulse (!) 52    Temp 98.3 F (36.8 C) (Oral)    Resp 16    Ht 5\' 5"  (1.651 m)    Wt 156 lb 8.4 oz (71 kg)    SpO2 100%    BMI 26.05 kg/m   Physical Exam awake, alert.  Chest clear to auscultation bilaterally.  Heart with slightly bradycardic but regular rhythm.  Abdomen soft, positive bowel sounds, nontender.  Trace pretibial edema bilaterally.  Imaging: Nm Bone Scan Whole Body  Result Date: 12/30/2018 CLINICAL DATA:  [name] Mathews a shin resistant prostate cancer, advanced disease, metastatic; PSA = 74.9 on 11/02/2018 EXAM: NUCLEAR MEDICINE WHOLE BODY BONE SCAN TECHNIQUE: Whole body anterior and posterior images were obtained approximately 3 hours after intravenous  injection of radiopharmaceutical. RADIOPHARMACEUTICALS:  20.6 mCi Technetium-77m MDP IV COMPARISON:  05/11/2018 FINDINGS: Numerous sites of abnormal osseous tracer accumulation are identified throughout the calvaria, sternum, LEFT scapula, BILATERAL ribs, thoracolumbar spine, pelvis, humeri, and femora consistent with osseous metastatic disease. New lesions are seen within the sternum, ribs, LEFT scapula, calvaria, and pelvis. Lesions at the proximal LEFT femur and mid RIGHT femur appear slightly more extensive. Lesions at the proximal humeri appears stable with a new lesion at the mid RIGHT humeral diaphysis. Expected urinary tract and soft tissue  distribution of tracer. IMPRESSION: Progressive osseous metastatic disease as above. Electronically Signed   By: Lavonia Dana M.D.   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 EXAM: CT ABDOMEN AND PELVIS WITH CONTRAST TECHNIQUE: Multidetector CT imaging of the abdomen and pelvis was performed using the standard protocol following bolus administration of intravenous contrast. CONTRAST:  152mL OMNIPAQUE IOHEXOL 300 MG/ML  SOLN COMPARISON:  05/11/2018 CT abdomen/pelvis. FINDINGS: Lower chest: There is patchy nodular thickening of the fissures and interlobular septae at both lung bases, significantly increased since 05/11/2018, compatible with lymphangitic carcinomatosis. Hepatobiliary: Normal liver size. No liver mass. Normal gallbladder with no radiopaque cholelithiasis. No biliary ductal dilatation. Pancreas: Normal, with no mass or duct dilation. Spleen: Normal size. No mass. Adrenals/Urinary Tract: Normal adrenals. Normal kidneys with no hydronephrosis and no renal mass. Chronic diffuse bladder wall thickening is unchanged. No significant bladder distention. No bladder stands. Stomach/Bowel: Normal non-distended  stomach. Normal caliber small bowel with no small bowel wall thickening. Normal appendix. Moderate sigmoid diverticulosis, with no large bowel wall thickening or significant pericolonic fat stranding. Oral contrast transits to the distal colon. Vascular/Lymphatic: Atherosclerotic nonaneurysmal abdominal aorta. Patent portal, splenic, hepatic and renal veins. Enlarged 1.3 cm posterior aortocaval node (series 2/image 34), increased from 1.0 cm. Enlarged 1.3 cm left common iliac node (series 2/image 46), increased from 1.0 cm. Reproductive: Mildly enlarged heterogeneously hyperenhancing prostate gland with mass-effect on the bladder base by the enlarged nodular median lobe of the prostate, not appreciably changed. Other: No pneumoperitoneum, ascites or focal fluid collection. Musculoskeletal: Widespread patchy sclerotic osseous lesions throughout the visualized skeleton, several of which have increased in size. For example left posterior L4 3.2 cm sclerotic lesion (series 2/image 37), increased from 2.0 cm. Left femoral neck 2.3 cm sclerotic lesion (series 2/image 74), increased from 0.8 cm. S2 2.3 cm sclerotic sacral lesion (series 6/image 65), increased from 1.8 cm. Marked lumbar spondylosis. Marked bilateral gynecomastia, partially visualized. IMPRESSION: 1. 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 2. Interval progression of widespread patchy sclerotic osseous metastatic disease throughout the skeleton. 3. Mild interval progression of retroperitoneal/left common iliac adenopathy. 4. Stable mildly enlarged heterogeneous hyperenhancing prostate gland. 5.  Aortic Atherosclerosis (ICD10-I70.0). Electronically Signed   By: Ilona Sorrel M.D.   On: 12/30/2018 14:43    Labs:  CBC: Recent Labs    08/31/18 1308 11/02/18 0938 12/30/18 1135 01/11/19 1302  WBC 7.1 5.5 5.6 6.7  HGB 10.9* 11.2* 11.4* 11.5*  HCT 35.6* 36.7* 36.6* 37.8*  PLT 229 235 323 289    COAGS: Recent Labs     01/11/19 1302  INR 1.0    BMP: Recent Labs    08/15/18 1019 08/31/18 1308 11/02/18 0938 12/30/18 1135  NA 141 140 145 145  K 3.3* 4.3 4.0 4.4  CL 106 100 108 106  CO2 26 32 28 29  GLUCOSE 120* 100* 102* 71  BUN 16 20 22 17   CALCIUM 8.7* 8.9 8.3* 8.0*  CREATININE 0.86 0.94 0.81 0.84  GFRNONAA >60 >60 >60 >60  GFRAA >60 >60 >60 >60    LIVER FUNCTION TESTS: Recent Labs    08/15/18 1019 08/31/18 1308 11/02/18 0938 12/30/18 1135  BILITOT 0.4 0.4 0.4 0.2*  AST 22 21 20 24   ALT 10 7 11 10   ALKPHOS 102 110 118 135*  PROT 6.7 6.8 6.8 7.2  ALBUMIN 3.3*  3.0* 3.0* 2.8*    TUMOR MARKERS: No results for input(s): AFPTM, CEA, CA199, CHROMGRNA in the last 8760 hours.  Assessment and Plan: 73 y.o. male with history of advanced prostate carcinoma, initially diagnosed in 2001, status post prior surgery and radiation therapy.  He is currently on [place], Lupron and [place].  He now has increasing PSA and disease progression on imaging and presents today for Port-A-Cath placement for additional treatment.Risks and benefits of image guided port-a-catheter placement was discussed with the patient including, but not limited to bleeding, infection, pneumothorax, or fibrin sheath development and need for additional procedures.  All of the patient's questions were answered, patient is agreeable to proceed. Consent signed and in chart.     Thank you for this interesting consult.  I greatly enjoyed meeting [name] Mathews and look forward to participating in their care.  A copy of this report was sent to the requesting provider on this date.  Electronically Signed: D. Rowe Robert, PA-C 01/11/2019, 1:27 PM   I spent a total of 25 minutes in face to face in clinical consultation, greater than 50% of which was counseling/coordinating care for Port-A-Cath placement

## 2019-01-18 ENCOUNTER — Encounter: Payer: Self-pay | Admitting: Oncology

## 2019-01-18 NOTE — Progress Notes (Signed)
Called pt to introduce myself as his Arboriculturist and to discuss copay assistance.  Pt gave me consent to apply in his behalf and he was approved w/ PAN for $7,300 to cover Taxotere and Xgeva from 01/18/19 to 01/17/20.  Pt is overqualified for the [facility] and Prostate grants.

## 2019-01-19 ENCOUNTER — Inpatient Hospital Stay: Payer: Medicare Other

## 2019-01-19 ENCOUNTER — Other Ambulatory Visit: Payer: Self-pay

## 2019-01-19 VITALS — BP 167/86 | HR 67 | Temp 99.0°F | Resp 19

## 2019-01-19 DIAGNOSIS — C61 Malignant neoplasm of prostate: Secondary | ICD-10-CM

## 2019-01-19 DIAGNOSIS — Z5111 Encounter for antineoplastic chemotherapy: Secondary | ICD-10-CM | POA: Diagnosis not present

## 2019-01-19 DIAGNOSIS — Z95828 Presence of other vascular implants and grafts: Secondary | ICD-10-CM

## 2019-01-19 LAB — CMP (CANCER CENTER ONLY)
ALT: 6 U/L (ref 0–44)
AST: 18 U/L (ref 15–41)
Albumin: 2.9 g/dL — ABNORMAL LOW (ref 3.5–5.0)
Alkaline Phosphatase: 169 U/L — ABNORMAL HIGH (ref 38–126)
Anion gap: 8 (ref 5–15)
BUN: 20 mg/dL (ref 8–23)
CO2: 27 mmol/L (ref 22–32)
Calcium: 8.9 mg/dL (ref 8.9–10.3)
Chloride: 107 mmol/L (ref 98–111)
Creatinine: 0.8 mg/dL (ref 0.61–1.24)
GFR, Est AFR Am: >60 mL/min (ref >60)
GFR, Estimated: >60 mL/min (ref >60)
Glucose, Bld: 107 mg/dL — ABNORMAL HIGH (ref 70–99)
Potassium: 3.6 mmol/L (ref 3.5–5.1)
Sodium: 142 mmol/L (ref 135–145)
Total Bilirubin: 0.2 mg/dL — ABNORMAL LOW (ref 0.3–1.2)
Total Protein: 6.6 g/dL (ref 6.5–8.1)

## 2019-01-19 LAB — CBC WITH DIFFERENTIAL (CANCER CENTER ONLY)
Abs Immature Granulocytes: 0.07 10*3/uL (ref 0.00–0.07)
Basophils Absolute: 0 10*3/uL (ref 0.0–0.1)
Basophils Relative: 0 %
Eosinophils Absolute: 0.1 10*3/uL (ref 0.0–0.5)
Eosinophils Relative: 2 %
HCT: 33.9 % — ABNORMAL LOW (ref 39.0–52.0)
Hemoglobin: 10.5 g/dL — ABNORMAL LOW (ref 13.0–17.0)
Immature Granulocytes: 1 %
Lymphocytes Relative: 16 %
Lymphs Abs: 1.1 10*3/uL (ref 0.7–4.0)
MCH: 25.1 pg — ABNORMAL LOW (ref 26.0–34.0)
MCHC: 31 g/dL (ref 30.0–36.0)
MCV: 80.9 fL (ref 80.0–100.0)
Monocytes Absolute: 0.5 10*3/uL (ref 0.1–1.0)
Monocytes Relative: 7 %
Neutro Abs: 5.1 10*3/uL (ref 1.7–7.7)
Neutrophils Relative %: 74 %
Platelet Count: 209 10*3/uL (ref 150–400)
RBC: 4.19 MIL/uL — ABNORMAL LOW (ref 4.22–5.81)
RDW: 15.9 % — ABNORMAL HIGH (ref 11.5–15.5)
WBC Count: 6.8 10*3/uL (ref 4.0–10.5)
nRBC: 0 % (ref 0.0–0.2)

## 2019-01-19 MED ORDER — SODIUM CHLORIDE 0.9 % IV SOLN: 10.0000 mg | Freq: Once | INTRAVENOUS | Status: DC

## 2019-01-19 MED ORDER — HEPARIN SOD (PORK) LOCK FLUSH 100 UNIT/ML IV SOLN
500.0000 [IU] | Freq: Once | INTRAVENOUS | Status: AC | PRN
  Administered 2019-01-19: 500 [IU]
  Filled 2019-01-19: qty 5

## 2019-01-19 MED ORDER — DEXAMETHASONE SODIUM PHOSPHATE 10 MG/ML IJ SOLN
10.0000 mg | Freq: Once | INTRAMUSCULAR | Status: AC
  Administered 2019-01-19: 10 mg via INTRAVENOUS

## 2019-01-19 MED ORDER — SODIUM CHLORIDE 0.9 % IV SOLN
Freq: Once | INTRAVENOUS | Status: AC
  Administered 2019-01-19: 10:00:00 via INTRAVENOUS
  Filled 2019-01-19: qty 250

## 2019-01-19 MED ORDER — SODIUM CHLORIDE 0.9% FLUSH
10.0000 mL | INTRAVENOUS | Status: DC | PRN
  Administered 2019-01-19: 10 mL
  Filled 2019-01-19: qty 10

## 2019-01-19 MED ORDER — SODIUM CHLORIDE 0.9% FLUSH
10.0000 mL | INTRAVENOUS | Status: DC | PRN
  Administered 2019-01-19: 10 mL via INTRAVENOUS
  Filled 2019-01-19: qty 10

## 2019-01-19 MED ORDER — DEXAMETHASONE SODIUM PHOSPHATE 10 MG/ML IJ SOLN
INTRAMUSCULAR | Status: AC
  Filled 2019-01-19: qty 1

## 2019-01-19 MED ORDER — SODIUM CHLORIDE 0.9 % IV SOLN
75.0000 mg/m2 | Freq: Once | INTRAVENOUS | Status: AC
  Administered 2019-01-19: 140 mg via INTRAVENOUS
  Filled 2019-01-19: qty 14

## 2019-01-19 NOTE — Progress Notes (Signed)
Stopped by infusion room to see pt & introduce myself & ask if he had any question/concerns about his treatment/education.  He states that he does not & is staying very positive.  Reminded to call with any concerns/questions & that we are available.  He expressed understanding.

## 2019-01-19 NOTE — Progress Notes (Signed)
Patient receiving first time taxotere today. BP increasing with rate each rate increase. Patient asymptomatic and reports feeling well throughout. Before last rate increase, patient blood pressure was 191/93. Dr. Alen Blew contacted and wishing to proceed with treatment. Last rate increase made. Will continue to monitor blood pressure and watch for and signs and symptoms.

## 2019-01-19 NOTE — Patient Instructions (Signed)
[facility] Discharge Instructions for Patients Receiving Chemotherapy  Today you received the following chemotherapy agents Taxotere  To help prevent nausea and vomiting after your treatment, we encourage you to take your nausea medication as prescribed.   If you develop nausea and vomiting that is not controlled by your nausea medication, call the clinic.   BELOW ARE SYMPTOMS THAT SHOULD BE REPORTED IMMEDIATELY:  *FEVER GREATER THAN 100.5 F  *CHILLS WITH OR WITHOUT FEVER  NAUSEA AND VOMITING THAT IS NOT CONTROLLED WITH YOUR NAUSEA MEDICATION  *UNUSUAL SHORTNESS OF BREATH  *UNUSUAL BRUISING OR BLEEDING  TENDERNESS IN MOUTH AND THROAT WITH OR WITHOUT PRESENCE OF ULCERS  *URINARY PROBLEMS  *BOWEL PROBLEMS  UNUSUAL RASH Items with * indicate a potential emergency and should be followed up as soon as possible.  Feel free to call the clinic should you have any questions or concerns. The clinic phone number is [phone].  Please show the [facility] at check-in to the Emergency Department and triage nurse.  Docetaxel injection (Taxotere) What is this medicine? DOCETAXEL (doe se TAX el) is a chemotherapy drug. It targets fast dividing cells, like cancer cells, and causes these cells to die. This medicine is used to treat many types of cancers like breast cancer, certain stomach cancers, head and neck cancer, lung cancer, and prostate cancer. This medicine may be used for other purposes; ask your health care provider or pharmacist if you have questions. COMMON BRAND NAME(S): Docefrez, Taxotere What should I tell my health care provider before I take this medicine? They need to know if you have any of these conditions:  infection (especially a virus infection such as chickenpox, cold sores, or herpes)  liver disease  low blood counts, like low white cell, platelet, or red cell counts  an unusual or allergic reaction to docetaxel, polysorbate 80, other  chemotherapy agents, other medicines, foods, dyes, or preservatives  pregnant or trying to get pregnant  breast-feeding How should I use this medicine? This drug is given as an infusion into a vein. It is administered in a hospital or clinic by a specially trained health care professional. Talk to your pediatrician regarding the use of this medicine in children. Special care may be needed. Overdosage: If you think you have taken too much of this medicine contact a poison control center or emergency room at once. NOTE: This medicine is only for you. Do not share this medicine with others. What if I miss a dose? It is important not to miss your dose. Call your doctor or health care professional if you are unable to keep an appointment. What may interact with this medicine?  aprepitant  certain antibiotics like erythromycin or clarithromycin  certain antivirals for HIV or hepatitis  certain medicines for fungal infections like fluconazole, itraconazole, ketoconazole, posaconazole, or voriconazole  cimetidine  ciprofloxacin  conivaptan  cyclosporine  dronedarone  fluvoxamine  grapefruit juice  imatinib  verapamil This list may not describe all possible interactions. Give your health care provider a list of all the medicines, herbs, non-prescription drugs, or dietary supplements you use. Also tell them if you smoke, drink alcohol, or use illegal drugs. Some items may interact with your medicine. What should I watch for while using this medicine? Your condition will be monitored carefully while you are receiving this medicine. You will need important blood work done while you are taking this medicine. Call your doctor or health care professional for advice if you get a fever, chills  or sore throat, or other symptoms of a cold or flu. Do not treat yourself. This drug decreases your body's ability to fight infections. Try to avoid being around people who are sick. Some products may  contain alcohol. Ask your health care professional if this medicine contains alcohol. Be sure to tell all health care professionals you are taking this medicine. Certain medicines, like metronidazole and disulfiram, can cause an unpleasant reaction when taken with alcohol. The reaction includes flushing, headache, nausea, vomiting, sweating, and increased thirst. The reaction can last from 30 minutes to several hours. You may get drowsy or dizzy. Do not drive, use machinery, or do anything that needs mental alertness until you know how this medicine affects you. Do not stand or sit up quickly, especially if you are an older patient. This reduces the risk of dizzy or fainting spells. Alcohol may interfere with the effect of this medicine. Talk to your health care professional about your risk of cancer. You may be more at risk for certain types of cancer if you take this medicine. Do not become pregnant while taking this medicine or for 6 months after stopping it. Women should inform their doctor if they wish to become pregnant or think they might be pregnant. There is a potential for serious side effects to an unborn child. Talk to your health care professional or pharmacist for more information. Do not breast-feed an infant while taking this medicine or for 1 to 2 weeks after stopping it. Males who get this medicine must use a condom during sex with females who can get pregnant. If you get a woman pregnant, the baby could have birth defects. The baby could die before they are born. You will need to continue wearing a condom for 3 months after stopping the medicine. Tell your health care provider right away if your partner becomes pregnant while you are taking this medicine. This may interfere with the ability to father a child. You should talk to your doctor or health care professional if you are concerned about your fertility. What side effects may I notice from receiving this medicine? Side effects that you  should report to your doctor or health care professional as soon as possible:  allergic reactions like skin rash, itching or hives, swelling of the face, lips, or tongue  blurred vision  breathing problems  changes in vision  low blood counts - This drug may decrease the number of white blood cells, red blood cells and platelets. You may be at increased risk for infections and bleeding.  nausea and vomiting  pain, redness or irritation at site where injected  pain, tingling, numbness in the hands or feet  redness, blistering, peeling, or loosening of the skin, including inside the mouth  signs of decreased platelets or bleeding - bruising, pinpoint red spots on the skin, black, tarry stools, nosebleeds  signs of decreased red blood cells - unusually weak or tired, fainting spells, lightheadedness  signs of infection - fever or chills, cough, sore throat, pain or difficulty passing urine  swelling of the ankle, feet, hands Side effects that usually do not require medical attention (report to your doctor or health care professional if they continue or are bothersome):  constipation  diarrhea  fingernail or toenail changes  hair loss  loss of appetite  mouth sores  muscle pain This list may not describe all possible side effects. Call your doctor for medical advice about side effects. You may report side effects to FDA at  1-800-FDA-1088. Where should I keep my medicine? This drug is given in a hospital or clinic and will not be stored at home. NOTE: This sheet is a summary. It may not cover all possible information. If you have questions about this medicine, talk to your doctor, pharmacist, or health care provider.  2020 Elsevier/Gold Standard (2018-08-13 12:23:11)  Coronavirus (COVID-19) Are you at risk?  Are you at risk for the Coronavirus (COVID-19)?  To be considered HIGH RISK for Coronavirus (COVID-19), you have to meet the following criteria:  . Traveled to  [place], [place], [place], [place] or [place]; or in the [place] to [place], [place], [place], or [place]; and have fever, cough, and shortness of breath within the last 2 weeks of travel OR . Been in close contact with a person diagnosed with COVID-19 within the last 2 weeks and have fever, cough, and shortness of breath . IF YOU DO NOT MEET THESE CRITERIA, YOU ARE CONSIDERED LOW RISK FOR COVID-19.  What to do if you are HIGH RISK for COVID-19?  [name] If you are having a medical emergency, call 911. . Seek medical care right away. Before you go to a doctor's office, urgent care or emergency department, call ahead and tell them about your recent travel, contact with someone diagnosed with COVID-19, and your symptoms. You should receive instructions from your physician's office regarding next steps of care.  . When you arrive at healthcare provider, tell the healthcare staff immediately you have returned from visiting [place], [place], [place], [place] or [place]; or traveled in the [place] to [place], [place], [place], or [place]; in the last two weeks or you have been in close contact with a person diagnosed with COVID-19 in the last 2 weeks.   . Tell the health care staff about your symptoms: fever, cough and shortness of breath. . After you have been seen by a medical provider, you will be either: o Tested for (COVID-19) and discharged home on quarantine except to seek medical care if symptoms worsen, and asked to  - Stay home and avoid contact with others until you get your results (4-5 days)  - Avoid travel on public transportation if possible (such as bus, train, or airplane) or o Sent to the Emergency Department by EMS for evaluation, COVID-19 testing, and possible admission depending on your condition and test results.  What to do if you are LOW RISK for COVID-19?  Reduce your risk of any infection by using the same precautions used for avoiding the common cold or flu:   [name] Wash your hands often with soap and warm water for at least 20 seconds.  If soap and water are not readily available, use an alcohol-based hand sanitizer with at least 60% alcohol.  . If coughing or sneezing, cover your mouth and nose by coughing or sneezing into the elbow areas of your shirt or coat, into a tissue or into your sleeve (not your hands). . Avoid shaking hands with others and consider head nods or verbal greetings only. . Avoid touching your eyes, nose, or mouth with unwashed hands.  . Avoid close contact with people who are sick. . Avoid places or events with large numbers of people in one location, like concerts or sporting events. . Carefully consider travel plans you have or are making. . If you are planning any travel outside or inside the [place], visit the CDC's Travelers' Health webpage for the latest health notices. . If you  have some symptoms but not all symptoms, continue to monitor at home and seek medical attention if your symptoms worsen. . If you are having a medical emergency, call 911.   [place] / e-Visit: [email or web address]         [facility]: [facility]: [phone]                   [facility]: [phone]

## 2019-01-19 NOTE — Progress Notes (Signed)
Pt states he had left leg edema yesterday, was "twice the normal size," also some itching.  Leg appears normal today, very slight edema at ankle.  Dr. Alen Blew informed, states ok to proceed with treatment today.

## 2019-01-19 NOTE — Patient Instructions (Signed)

## 2019-01-20 ENCOUNTER — Telehealth: Payer: Self-pay | Admitting: *Deleted

## 2019-01-20 LAB — PROSTATE-SPECIFIC AG, SERUM (LABCORP): Prostate Specific Ag, Serum: 102 ng/mL — ABNORMAL HIGH (ref 0.0–4.0)

## 2019-01-20 NOTE — Telephone Encounter (Signed)
-----   Message from Rolene Course, RN sent at 01/19/2019 12:12 PM EDT ----- Regarding: [name] 1st Tx F/U call [name] 1st Tx F/U call

## 2019-01-20 NOTE — Telephone Encounter (Signed)
[place] for chemotherapy F/U.  Patient is doing well.  Denies n/v.  Denies any new side effects or symptoms.  Bowel and bladder functioning well.  Eating and drinking well.  Instructed to drink 64 oz minimum daily or at least the day before, of and after treatment.   Denies questions or needs at this time.  Encouraged to call [phone] Mon -Fri 8:00 am - 4:30 pm or anytime as needed for symptoms, changes or event outside office hours.

## 2019-01-21 ENCOUNTER — Other Ambulatory Visit: Payer: Self-pay

## 2019-01-21 ENCOUNTER — Inpatient Hospital Stay: Payer: Medicare Other

## 2019-01-21 DIAGNOSIS — Z5111 Encounter for antineoplastic chemotherapy: Secondary | ICD-10-CM | POA: Diagnosis not present

## 2019-01-21 DIAGNOSIS — C61 Malignant neoplasm of prostate: Secondary | ICD-10-CM

## 2019-01-21 MED ORDER — PEGFILGRASTIM-JMDB 6 MG/0.6ML ~~LOC~~ SOSY
6.0000 mg | PREFILLED_SYRINGE | Freq: Once | SUBCUTANEOUS | Status: AC
  Administered 2019-01-21: 11:00:00 6 mg via SUBCUTANEOUS
  Filled 2019-01-21: qty 0.6

## 2019-01-21 NOTE — Patient Instructions (Signed)

## 2019-01-24 ENCOUNTER — Telehealth: Payer: Self-pay

## 2019-01-24 NOTE — Telephone Encounter (Signed)
Called patient back and explained that he can purchase melatonin at any pharmacy and to follow the directions on the bottle. If the melatonin is not effective to call back. Patient verbalized understanding and had no other questions or concerns.

## 2019-01-24 NOTE — Telephone Encounter (Signed)
-----   Message from [name], MD sent at 01/24/2019 10:12 AM EDT ----- Regarding: RE: patient concern Ok to try melatonin and let us know if this was not helpful.  Thanks.  ----- Message ----- From: Scot Dock, RN Sent: 01/24/2019   9:57 AM EDT To: [name], MD Subject: patient concern                                States he has not been able to sleep since 1st chemo tx on 7/29. I see he had 10mg  of Dex w/ tx. Would you be okay with him trying melatonin perhaps?

## 2019-01-27 ENCOUNTER — Other Ambulatory Visit: Payer: Self-pay

## 2019-01-27 ENCOUNTER — Emergency Department (HOSPITAL_COMMUNITY)
Admission: EM | Admit: 2019-01-27 | Discharge: 2019-01-27 | Discharge status: Against Medical Advice | Payer: Medicare Other

## 2019-01-27 NOTE — ED Notes (Signed)
Pt called from lobby x2 No response

## 2019-01-27 NOTE — ED Notes (Signed)
Pt called from lobby x1  No response

## 2019-02-10 ENCOUNTER — Other Ambulatory Visit: Payer: Self-pay

## 2019-02-10 ENCOUNTER — Inpatient Hospital Stay: Payer: Medicare Other

## 2019-02-10 ENCOUNTER — Inpatient Hospital Stay: Payer: Medicare Other | Attending: Oncology | Admitting: Oncology

## 2019-02-10 ENCOUNTER — Telehealth: Payer: Self-pay

## 2019-02-10 VITALS — BP 114/66 | HR 81 | Temp 98.7°F | Resp 18 | Wt 156.8 lb

## 2019-02-10 DIAGNOSIS — C61 Malignant neoplasm of prostate: Secondary | ICD-10-CM | POA: Diagnosis present

## 2019-02-10 DIAGNOSIS — Z5111 Encounter for antineoplastic chemotherapy: Secondary | ICD-10-CM | POA: Insufficient documentation

## 2019-02-10 DIAGNOSIS — C7951 Secondary malignant neoplasm of bone: Secondary | ICD-10-CM | POA: Diagnosis present

## 2019-02-10 DIAGNOSIS — Z95828 Presence of other vascular implants and grafts: Secondary | ICD-10-CM

## 2019-02-10 DIAGNOSIS — Z5189 Encounter for other specified aftercare: Secondary | ICD-10-CM | POA: Insufficient documentation

## 2019-02-10 LAB — CBC WITH DIFFERENTIAL (CANCER CENTER ONLY)
Abs Immature Granulocytes: 0.03 10*3/uL (ref 0.00–0.07)
Basophils Absolute: 0 10*3/uL (ref 0.0–0.1)
Basophils Relative: 1 %
Eosinophils Absolute: 0 10*3/uL (ref 0.0–0.5)
Eosinophils Relative: 0 %
HCT: 34.5 % — ABNORMAL LOW (ref 39.0–52.0)
Hemoglobin: 10.6 g/dL — ABNORMAL LOW (ref 13.0–17.0)
Immature Granulocytes: 0 %
Lymphocytes Relative: 11 %
Lymphs Abs: 0.9 10*3/uL (ref 0.7–4.0)
MCH: 25.5 pg — ABNORMAL LOW (ref 26.0–34.0)
MCHC: 30.7 g/dL (ref 30.0–36.0)
MCV: 83.1 fL (ref 80.0–100.0)
Monocytes Absolute: 0.8 10*3/uL (ref 0.1–1.0)
Monocytes Relative: 9 %
Neutro Abs: 6.7 10*3/uL (ref 1.7–7.7)
Neutrophils Relative %: 79 %
Platelet Count: 322 10*3/uL (ref 150–400)
RBC: 4.15 MIL/uL — ABNORMAL LOW (ref 4.22–5.81)
RDW: 17.7 % — ABNORMAL HIGH (ref 11.5–15.5)
WBC Count: 8.5 10*3/uL (ref 4.0–10.5)
nRBC: 0 % (ref 0.0–0.2)

## 2019-02-10 LAB — CMP (CANCER CENTER ONLY)
ALT: 11 U/L (ref 0–44)
AST: 20 U/L (ref 15–41)
Albumin: 3.1 g/dL — ABNORMAL LOW (ref 3.5–5.0)
Alkaline Phosphatase: 113 U/L (ref 38–126)
Anion gap: 9 (ref 5–15)
BUN: 30 mg/dL — ABNORMAL HIGH (ref 8–23)
CO2: 27 mmol/L (ref 22–32)
Calcium: 9.1 mg/dL (ref 8.9–10.3)
Chloride: 104 mmol/L (ref 98–111)
Creatinine: 0.94 mg/dL (ref 0.61–1.24)
GFR, Est AFR Am: >60 mL/min (ref >60)
GFR, Estimated: >60 mL/min (ref >60)
Glucose, Bld: 81 mg/dL (ref 70–99)
Potassium: 3.8 mmol/L (ref 3.5–5.1)
Sodium: 140 mmol/L (ref 135–145)
Total Bilirubin: 0.3 mg/dL (ref 0.3–1.2)
Total Protein: 6.7 g/dL (ref 6.5–8.1)

## 2019-02-10 MED ORDER — HEPARIN SOD (PORK) LOCK FLUSH 100 UNIT/ML IV SOLN
500.0000 [IU] | Freq: Once | INTRAVENOUS | Status: DC | PRN
  Filled 2019-02-10: qty 5

## 2019-02-10 MED ORDER — DEXAMETHASONE SODIUM PHOSPHATE 10 MG/ML IJ SOLN
INTRAMUSCULAR | Status: AC
  Filled 2019-02-10: qty 1

## 2019-02-10 MED ORDER — DENOSUMAB 120 MG/1.7ML ~~LOC~~ SOLN
SUBCUTANEOUS | Status: AC
  Filled 2019-02-10: qty 1.7

## 2019-02-10 MED ORDER — DEXAMETHASONE SODIUM PHOSPHATE 10 MG/ML IJ SOLN
10.0000 mg | Freq: Once | INTRAMUSCULAR | Status: AC
  Administered 2019-02-10: 10 mg via INTRAVENOUS

## 2019-02-10 MED ORDER — SODIUM CHLORIDE 0.9% FLUSH
10.0000 mL | INTRAVENOUS | Status: DC | PRN
  Filled 2019-02-10: qty 10

## 2019-02-10 MED ORDER — SODIUM CHLORIDE 0.9 % IV SOLN
75.0000 mg/m2 | Freq: Once | INTRAVENOUS | Status: AC
  Administered 2019-02-10: 140 mg via INTRAVENOUS
  Filled 2019-02-10: qty 14

## 2019-02-10 MED ORDER — SODIUM CHLORIDE 0.9 % IV SOLN
Freq: Once | INTRAVENOUS | Status: AC
  Administered 2019-02-10: 10:00:00 via INTRAVENOUS
  Filled 2019-02-10: qty 250

## 2019-02-10 MED ORDER — DENOSUMAB 120 MG/1.7ML ~~LOC~~ SOLN
120.0000 mg | Freq: Once | SUBCUTANEOUS | Status: AC
  Administered 2019-02-10: 11:00:00 120 mg via SUBCUTANEOUS

## 2019-02-10 NOTE — Telephone Encounter (Signed)
During patient's flush appointment, nurse unable to access port. Second nurse attempted without success. Port seems to be flipped as needle cannot be advanced. Dr. Alen Blew made aware and dye study ordered. Patient aware to expect a call from Radiology for scheduling.

## 2019-02-10 NOTE — Patient Instructions (Signed)
[facility] Discharge Instructions for Patients Receiving Chemotherapy  Today you received the following chemotherapy agents Taxotere  To help prevent nausea and vomiting after your treatment, we encourage you to take your nausea medication as prescribed.   If you develop nausea and vomiting that is not controlled by your nausea medication, call the clinic.   BELOW ARE SYMPTOMS THAT SHOULD BE REPORTED IMMEDIATELY:  *FEVER GREATER THAN 100.5 F  *CHILLS WITH OR WITHOUT FEVER  NAUSEA AND VOMITING THAT IS NOT CONTROLLED WITH YOUR NAUSEA MEDICATION  *UNUSUAL SHORTNESS OF BREATH  *UNUSUAL BRUISING OR BLEEDING  TENDERNESS IN MOUTH AND THROAT WITH OR WITHOUT PRESENCE OF ULCERS  *URINARY PROBLEMS  *BOWEL PROBLEMS  UNUSUAL RASH Items with * indicate a potential emergency and should be followed up as soon as possible.  Feel free to call the clinic should you have any questions or concerns. The clinic phone number is [phone].  Please show the Central at check-in to the Emergency Department and triage nurse.  Docetaxel injection (Taxotere) What is this medicine? DOCETAXEL (doe se TAX el) is a chemotherapy drug. It targets fast dividing cells, like cancer cells, and causes these cells to die. This medicine is used to treat many types of cancers like breast cancer, certain stomach cancers, head and neck cancer, lung cancer, and prostate cancer. This medicine may be used for other purposes; ask your health care provider or pharmacist if you have questions. COMMON BRAND NAME(S): Docefrez, Taxotere What should I tell my health care provider before I take this medicine? They need to know if you have any of these conditions:  infection (especially a virus infection such as chickenpox, cold sores, or herpes)  liver disease  low blood counts, like low white cell, platelet, or red cell counts  an unusual or allergic reaction to docetaxel, polysorbate 80, other  chemotherapy agents, other medicines, foods, dyes, or preservatives  pregnant or trying to get pregnant  breast-feeding How should I use this medicine? This drug is given as an infusion into a vein. It is administered in a hospital or clinic by a specially trained health care professional. Talk to your pediatrician regarding the use of this medicine in children. Special care may be needed. Overdosage: If you think you have taken too much of this medicine contact a poison control center or emergency room at once. NOTE: This medicine is only for you. Do not share this medicine with others. What if I miss a dose? It is important not to miss your dose. Call your doctor or health care professional if you are unable to keep an appointment. What may interact with this medicine?  aprepitant  certain antibiotics like erythromycin or clarithromycin  certain antivirals for HIV or hepatitis  certain medicines for fungal infections like fluconazole, itraconazole, ketoconazole, posaconazole, or voriconazole  cimetidine  ciprofloxacin  conivaptan  cyclosporine  dronedarone  fluvoxamine  grapefruit juice  imatinib  verapamil This list may not describe all possible interactions. Give your health care provider a list of all the medicines, herbs, non-prescription drugs, or dietary supplements you use. Also tell them if you smoke, drink alcohol, or use illegal drugs. Some items may interact with your medicine. What should I watch for while using this medicine? Your condition will be monitored carefully while you are receiving this medicine. You will need important blood work done while you are taking this medicine. Call your doctor or health care professional for advice if you get a fever, chills  or sore throat, or other symptoms of a cold or flu. Do not treat yourself. This drug decreases your body's ability to fight infections. Try to avoid being around people who are sick. Some products may  contain alcohol. Ask your health care professional if this medicine contains alcohol. Be sure to tell all health care professionals you are taking this medicine. Certain medicines, like metronidazole and disulfiram, can cause an unpleasant reaction when taken with alcohol. The reaction includes flushing, headache, nausea, vomiting, sweating, and increased thirst. The reaction can last from 30 minutes to several hours. You may get drowsy or dizzy. Do not drive, use machinery, or do anything that needs mental alertness until you know how this medicine affects you. Do not stand or sit up quickly, especially if you are an older patient. This reduces the risk of dizzy or fainting spells. Alcohol may interfere with the effect of this medicine. Talk to your health care professional about your risk of cancer. You may be more at risk for certain types of cancer if you take this medicine. Do not become pregnant while taking this medicine or for 6 months after stopping it. Women should inform their doctor if they wish to become pregnant or think they might be pregnant. There is a potential for serious side effects to an unborn child. Talk to your health care professional or pharmacist for more information. Do not breast-feed an infant while taking this medicine or for 1 to 2 weeks after stopping it. Males who get this medicine must use a condom during sex with females who can get pregnant. If you get a woman pregnant, the baby could have birth defects. The baby could die before they are born. You will need to continue wearing a condom for 3 months after stopping the medicine. Tell your health care provider right away if your partner becomes pregnant while you are taking this medicine. This may interfere with the ability to father a child. You should talk to your doctor or health care professional if you are concerned about your fertility. What side effects may I notice from receiving this medicine? Side effects that you  should report to your doctor or health care professional as soon as possible:  allergic reactions like skin rash, itching or hives, swelling of the face, lips, or tongue  blurred vision  breathing problems  changes in vision  low blood counts - This drug may decrease the number of white blood cells, red blood cells and platelets. You may be at increased risk for infections and bleeding.  nausea and vomiting  pain, redness or irritation at site where injected  pain, tingling, numbness in the hands or feet  redness, blistering, peeling, or loosening of the skin, including inside the mouth  signs of decreased platelets or bleeding - bruising, pinpoint red spots on the skin, black, tarry stools, nosebleeds  signs of decreased red blood cells - unusually weak or tired, fainting spells, lightheadedness  signs of infection - fever or chills, cough, sore throat, pain or difficulty passing urine  swelling of the ankle, feet, hands Side effects that usually do not require medical attention (report to your doctor or health care professional if they continue or are bothersome):  constipation  diarrhea  fingernail or toenail changes  hair loss  loss of appetite  mouth sores  muscle pain This list may not describe all possible side effects. Call your doctor for medical advice about side effects. You may report side effects to FDA at  1-800-FDA-1088. Where should I keep my medicine? This drug is given in a hospital or clinic and will not be stored at home. NOTE: This sheet is a summary. It may not cover all possible information. If you have questions about this medicine, talk to your doctor, pharmacist, or health care provider.  2020 Elsevier/Gold Standard (2018-08-13 12:23:11)  Coronavirus (COVID-19) Are you at risk?  Are you at risk for the Coronavirus (COVID-19)?  To be considered HIGH RISK for Coronavirus (COVID-19), you have to meet the following criteria:  . Traveled to  [place], [place], [place], [place] or [place]; or in the [place] to [place], [place], [place], or [place]; and have fever, cough, and shortness of breath within the last 2 weeks of travel OR . Been in close contact with a person diagnosed with COVID-19 within the last 2 weeks and have fever, cough, and shortness of breath . IF YOU DO NOT MEET THESE CRITERIA, YOU ARE CONSIDERED LOW RISK FOR COVID-19.  What to do if you are HIGH RISK for COVID-19?  [name] If you are having a medical emergency, call 911. . Seek medical care right away. Before you go to a doctor's office, urgent care or emergency department, call ahead and tell them about your recent travel, contact with someone diagnosed with COVID-19, and your symptoms. You should receive instructions from your physician's office regarding next steps of care.  . When you arrive at healthcare provider, tell the healthcare staff immediately you have returned from visiting [place], [place], [place], [place] or [place]; or traveled in the [place] to [place], [place], [place], or [place]; in the last two weeks or you have been in close contact with a person diagnosed with COVID-19 in the last 2 weeks.   . Tell the health care staff about your symptoms: fever, cough and shortness of breath. . After you have been seen by a medical provider, you will be either: o Tested for (COVID-19) and discharged home on quarantine except to seek medical care if symptoms worsen, and asked to  - Stay home and avoid contact with others until you get your results (4-5 days)  - Avoid travel on public transportation if possible (such as bus, train, or airplane) or o Sent to the Emergency Department by EMS for evaluation, COVID-19 testing, and possible admission depending on your condition and test results.  What to do if you are LOW RISK for COVID-19?  Reduce your risk of any infection by using the same precautions used for avoiding the common cold or flu:   [name] Wash your hands often with soap and warm water for at least 20 seconds.  If soap and water are not readily available, use an alcohol-based hand sanitizer with at least 60% alcohol.  . If coughing or sneezing, cover your mouth and nose by coughing or sneezing into the elbow areas of your shirt or coat, into a tissue or into your sleeve (not your hands). . Avoid shaking hands with others and consider head nods or verbal greetings only. . Avoid touching your eyes, nose, or mouth with unwashed hands.  . Avoid close contact with people who are sick. . Avoid places or events with large numbers of people in one location, like concerts or sporting events. . Carefully consider travel plans you have or are making. . If you are planning any travel outside or inside the [place], visit the CDC's Travelers' Health webpage for the latest health notices. . If you  have some symptoms but not all symptoms, continue to monitor at home and seek medical attention if your symptoms worsen. . If you are having a medical emergency, call 911.   [place] / e-Visit: [email or web address]         [facility]: [facility]: [phone]                   [facility]: [phone]

## 2019-02-10 NOTE — Patient Instructions (Signed)

## 2019-02-10 NOTE — Progress Notes (Signed)
Hematology and Oncology Follow Up Visit  [name] Mathews [id number] May 25, 1946 73 y.o. 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  He was diagnosed in 2001 with Gleason score of 4+5 = 9 prostate cancer.     Prior Therapy: 1. He was started on Lupron after attempted prostatectomy in 2001.    2. 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  3. He is status post radiation therapy to the prostate fossa completed in December 2018. He received 52.5 gr 20 fractions.  4. Zytiga 1000 mg po daily with prednisone 5 mg started in 09/2011.   Current therapy:   Xgeva 120 mg started on 10/31/2011.    Lupron 30 mg every 4 months.   Taxotere chemotherapy 75 mg per metered square with cycle 1 started on 01/19/2019.  He is here for cycle 2.   Interim History: [name] Mathews returns today for a repeat evaluation.  Since the last visit, he received the first cycle of chemotherapy without any major complications.  He did report some mild nausea but no vomiting.  He denies any abdominal pain, diarrhea or worsening neuropathy.  He remains active and continues to attend to activities of daily living.  He denies any excessive fatigue or lower extremity edema.  He denies any bone pain or pathological fractures.   He denied headaches, blurry vision, syncope or seizures.  Denies any fevers, chills or sweats.  Denied chest pain, palpitation, orthopnea or leg edema.  Denied cough, wheezing or hemoptysis.  Denied nausea, vomiting or abdominal pain.  Denies any constipation or diarrhea.  Denies any frequency urgency or hesitancy.  Denies any arthralgias or myalgias.  Denies any skin rashes or lesions.  Denies any bleeding or clotting tendency.  Denies any easy bruising.  Denies any hair or nail changes.  Denies any anxiety or depression.  Remaining  review of system is negative.       Medications: Updated today without any changes. Current Outpatient Medications  Medication Sig Dispense Refill  . albuterol (PROVENTIL HFA;VENTOLIN HFA) 108 (90 Base) MCG/ACT inhaler Inhale 1-2 puffs into the lungs every 6 (six) hours as needed for wheezing or shortness of breath. 1 Inhaler 0  . atorvastatin (LIPITOR) 20 MG tablet Take 1 tablet (20 mg total) by mouth daily. 90 tablet 3  . azithromycin (ZITHROMAX) 250 MG tablet Take 2 tabs day 1, then 1 tab daily 6 each 0  . benzonatate (TESSALON) 100 MG capsule Take 1 capsule (100 mg total) by mouth every 8 (eight) hours. 21 capsule 0  . cetirizine (ZYRTEC) 5 MG tablet Take 1 tablet (5 mg total) by mouth daily. 30 tablet 1  . denosumab (XGEVA) 120 MG/1.7ML SOLN Inject 120 mg into the skin every 30 (thirty) days.    . DULoxetine (CYMBALTA) 60 MG capsule Take 1 capsule (60 mg total) by mouth daily. 90 capsule 3  . EPIPEN 2-PAK 0.3 MG/0.3ML SOAJ injection Reported on 01/04/2016    . gabapentin (NEURONTIN) 100 MG capsule Take 1 capsule (100 mg total) by mouth 3 (three) times daily. 90 capsule 0  . hydrochlorothiazide (HYDRODIURIL) 25 MG tablet Take 1 tablet (25 mg total) by mouth daily. 90 tablet 3  . HYDROcodone-acetaminophen (NORCO/VICODIN) 5-325 MG tablet Take 2 tablets by mouth every 6 (six) hours as needed for severe pain. 10 tablet 0  . leuprolide (LUPRON) 30  MG injection Inject 30 mg into the muscle every 4 (four) months.    . lidocaine-prilocaine (EMLA) cream Apply 1 application topically as needed. 30 g 0  . predniSONE (DELTASONE) 10 MG tablet Take 1 tablet (10 mg total) by mouth daily with breakfast. 7 tablet 0  . predniSONE (DELTASONE) 5 MG tablet TAKE 1 TABLET (5 MG TOTAL) BY MOUTH 2 (TWO) TIMES DAILY. 90 tablet 3  . prochlorperazine (COMPAZINE) 10 MG tablet Take 1 tablet (10 mg total) by mouth every 6 (six) hours as needed for nausea or vomiting. 30 tablet 0  . rOPINIRole (REQUIP) 2 MG tablet Take  1 tablet (2 mg total) by mouth at bedtime. 90 tablet 3  . ZYTIGA 250 MG tablet TAKE 4 TABLETS BY MOUTH ONCE DAILY AS DIRECTED.  TAKE 1 HOUR BEFORE OR 2 HOURS AFTER A MEAL 120 tablet 11   No current facility-administered medications for this visit.      Allergies:  Allergies  Allergen Reactions  . Bee Venom Anaphylaxis, Shortness Of Breath and Swelling    Tongue swelling.   . Wasp Venom Anaphylaxis, Shortness Of Breath and Swelling    Tongue swelling   . Percocet [Oxycodone-Acetaminophen] Palpitations    His past medical history, social history and family history remains unchanged on review.    Physical Exam:   Blood pressure 114/66, pulse 81, temperature 98.7 F (37.1 C), temperature source Oral, resp. rate 18, weight 156 lb 12.8 oz (71.1 kg), SpO2 98 %.     ECOG: 0     General appearance: Alert, awake without any distress. Head: Atraumatic without abnormalities Oropharynx: Without any thrush or ulcers. Eyes: No scleral icterus. Lymph nodes: No lymphadenopathy noted in the cervical, supraclavicular, or axillary nodes Heart:regular rate and rhythm, without any murmurs or gallops.   Lung: Clear to auscultation without any rhonchi, wheezes or dullness to percussion. Abdomin: Soft, nontender without any shifting dullness or ascites. Musculoskeletal: No clubbing or cyanosis. Neurological: No motor or sensory deficits. Skin: No rashes or lesions.         Lab Results: Lab Results  Component Value Date   WBC 6.8 01/19/2019   HGB 10.5 (L) 01/19/2019   HCT 33.9 (L) 01/19/2019   MCV 80.9 01/19/2019   PLT 209 01/19/2019     Chemistry      Component Value Date/Time   NA 142 01/19/2019 0857   NA 141 05/20/2017 1425   K 3.6 01/19/2019 0857   K 4.3 05/20/2017 1425   CL 107 01/19/2019 0857   CL 105 12/08/2012 1504   CO2 27 01/19/2019 0857   CO2 31 (H) 05/20/2017 1425   BUN 20 01/19/2019 0857   BUN 17.8 05/20/2017 1425   CREATININE 0.80 01/19/2019 0857    CREATININE 0.9 05/20/2017 1425      Component Value Date/Time   CALCIUM 8.9 01/19/2019 0857   CALCIUM 9.4 05/20/2017 1425   ALKPHOS 169 (H) 01/19/2019 0857   ALKPHOS 72 05/20/2017 1425   AST 18 01/19/2019 0857   AST 15 05/20/2017 1425   ALT 6 01/19/2019 0857   ALT <6 05/20/2017 1425   BILITOT 0.2 (L) 01/19/2019 0857   BILITOT 0.25 05/20/2017 1425        Results for TYLER, CUBIT (MRN [id number]) as of 02/10/2019 08:51  Ref. Range 12/30/2018 11:35 01/19/2019 08:57  Prostate Specific Ag, Serum Latest Ref Range: 0.0 - 4.0 ng/mL 100.0 (H) 102.0 (H)       Impression and Plan:   73 year old man  with:    1.  Castration-resistant prostate cancer with metastatic disease to the bone and lymphadenopathy since 2013.  He has progressed on multiple therapies outlined above.   He is currently on Taxotere chemotherapy and received the first cycle without any complications.  Risks and benefits of continuing this therapy long-term was reviewed as well as proceeding with cycle 2 was discussed.  Potential complications include nausea, vomiting, myelosuppression and worsening neuropathy.  Alternative options were also discussed but at this time the plan is to continue with this treatment.  He is medically fit and if his counts are adequate we will proceed with cycle 2.   2. Bone directed therapy: He is currently on Xgeva and I recommended continuing that every 6 weeks.  Long-term complications including osteonecrosis of the jaw and hypocalcemia were reiterated.   3. Androgen deprivation: Next Lupron will be in September 2020.  Long-term complications were reiterated including weight gain and hot flashes.  4.  Back pain: No issues reported at this time.  5.  IV access: Port-A-Cath in place although some difficulties obtaining blood draw was encountered today.  The plan is to continue with chemotherapy administration via Port-A-Cath possible.  6.  Antiemetics: Nausea has been very mild and Compazine  has been effective in treating it.  7.  Growth factor support: He is at risk of neutropenic sepsis and growth factor support will be given after each cycle of therapy.  8. Followup: In 3 weeks for the next cycle of treatment.   25  minutes was spent with the patient face-to-face today.  More than 50% of time was spent on updating his disease status, complication associated with chemotherapy and addressing complications related to his cancer and cancer care.   Zola Button, MD 8/20/20208:48 AM

## 2019-02-11 ENCOUNTER — Telehealth: Payer: Self-pay | Admitting: Oncology

## 2019-02-11 LAB — PROSTATE-SPECIFIC AG, SERUM (LABCORP): Prostate Specific Ag, Serum: 225 ng/mL — ABNORMAL HIGH (ref 0.0–4.0)

## 2019-02-11 NOTE — Telephone Encounter (Signed)
Called and spoke with patient. Confirmed 10/1 appt

## 2019-02-12 ENCOUNTER — Other Ambulatory Visit: Payer: Self-pay

## 2019-02-12 ENCOUNTER — Inpatient Hospital Stay: Payer: Medicare Other

## 2019-02-12 VITALS — BP 104/66 | HR 113 | Temp 98.3°F | Resp 20

## 2019-02-12 DIAGNOSIS — C61 Malignant neoplasm of prostate: Secondary | ICD-10-CM

## 2019-02-12 DIAGNOSIS — Z5111 Encounter for antineoplastic chemotherapy: Secondary | ICD-10-CM | POA: Diagnosis not present

## 2019-02-12 MED ORDER — PEGFILGRASTIM-JMDB 6 MG/0.6ML ~~LOC~~ SOSY
6.0000 mg | PREFILLED_SYRINGE | Freq: Once | SUBCUTANEOUS | Status: AC
  Administered 2019-02-12: 6 mg via SUBCUTANEOUS

## 2019-02-15 ENCOUNTER — Encounter (HOSPITAL_COMMUNITY): Payer: Self-pay | Admitting: Interventional Radiology

## 2019-02-15 ENCOUNTER — Other Ambulatory Visit: Payer: Self-pay

## 2019-02-15 ENCOUNTER — Ambulatory Visit (HOSPITAL_COMMUNITY)
Admission: RE | Admit: 2019-02-15 | Discharge: 2019-02-15 | Disposition: A | Discharge status: Home / Self Care | Payer: Medicare Other | Source: Ambulatory Visit | Attending: Oncology | Admitting: Oncology

## 2019-02-15 DIAGNOSIS — Z452 Encounter for adjustment and management of vascular access device: Secondary | ICD-10-CM | POA: Insufficient documentation

## 2019-02-15 DIAGNOSIS — Z95828 Presence of other vascular implants and grafts: Secondary | ICD-10-CM

## 2019-02-15 HISTORY — PX: IR CV LINE INJECTION: IMG2294

## 2019-02-15 MED ORDER — HEPARIN SOD (PORK) LOCK FLUSH 100 UNIT/ML IV SOLN
INTRAVENOUS | Status: DC | PRN
  Administered 2019-02-15: 500 [IU]

## 2019-02-15 MED ORDER — HEPARIN SOD (PORK) LOCK FLUSH 100 UNIT/ML IV SOLN
INTRAVENOUS | Status: AC
  Filled 2019-02-15: qty 5

## 2019-02-15 MED ORDER — IOHEXOL 300 MG/ML  SOLN
50.0000 mL | Freq: Once | INTRAMUSCULAR | Status: AC | PRN
  Administered 2019-02-15: 6 mL via INTRAVENOUS

## 2019-03-03 ENCOUNTER — Inpatient Hospital Stay: Payer: Medicare Other

## 2019-03-03 ENCOUNTER — Inpatient Hospital Stay: Payer: Medicare Other | Attending: Oncology

## 2019-03-03 ENCOUNTER — Other Ambulatory Visit: Payer: Self-pay

## 2019-03-03 VITALS — BP 101/78 | Temp 98.0°F | Resp 18 | Wt 157.5 lb

## 2019-03-03 DIAGNOSIS — Z5111 Encounter for antineoplastic chemotherapy: Secondary | ICD-10-CM | POA: Insufficient documentation

## 2019-03-03 DIAGNOSIS — C7951 Secondary malignant neoplasm of bone: Secondary | ICD-10-CM | POA: Insufficient documentation

## 2019-03-03 DIAGNOSIS — Z5189 Encounter for other specified aftercare: Secondary | ICD-10-CM | POA: Diagnosis not present

## 2019-03-03 DIAGNOSIS — C61 Malignant neoplasm of prostate: Secondary | ICD-10-CM

## 2019-03-03 DIAGNOSIS — Z95828 Presence of other vascular implants and grafts: Secondary | ICD-10-CM

## 2019-03-03 LAB — CMP (CANCER CENTER ONLY)
ALT: 12 U/L (ref 0–44)
AST: 20 U/L (ref 15–41)
Albumin: 3.1 g/dL — ABNORMAL LOW (ref 3.5–5.0)
Alkaline Phosphatase: 113 U/L (ref 38–126)
Anion gap: 8 (ref 5–15)
BUN: 23 mg/dL (ref 8–23)
CO2: 30 mmol/L (ref 22–32)
Calcium: 8.7 mg/dL — ABNORMAL LOW (ref 8.9–10.3)
Chloride: 101 mmol/L (ref 98–111)
Creatinine: 1.01 mg/dL (ref 0.61–1.24)
GFR, Est AFR Am: >60 mL/min (ref >60)
GFR, Estimated: >60 mL/min (ref >60)
Glucose, Bld: 92 mg/dL (ref 70–99)
Potassium: 4 mmol/L (ref 3.5–5.1)
Sodium: 139 mmol/L (ref 135–145)
Total Bilirubin: 0.3 mg/dL (ref 0.3–1.2)
Total Protein: 6.4 g/dL — ABNORMAL LOW (ref 6.5–8.1)

## 2019-03-03 LAB — CBC WITH DIFFERENTIAL (CANCER CENTER ONLY)
Abs Immature Granulocytes: 0.02 10*3/uL (ref 0.00–0.07)
Basophils Absolute: 0 10*3/uL (ref 0.0–0.1)
Basophils Relative: 0 %
Eosinophils Absolute: 0 10*3/uL (ref 0.0–0.5)
Eosinophils Relative: 0 %
HCT: 32.6 % — ABNORMAL LOW (ref 39.0–52.0)
Hemoglobin: 10.2 g/dL — ABNORMAL LOW (ref 13.0–17.0)
Immature Granulocytes: 0 %
Lymphocytes Relative: 12 %
Lymphs Abs: 1 10*3/uL (ref 0.7–4.0)
MCH: 25.4 pg — ABNORMAL LOW (ref 26.0–34.0)
MCHC: 31.3 g/dL (ref 30.0–36.0)
MCV: 81.1 fL (ref 80.0–100.0)
Monocytes Absolute: 0.8 10*3/uL (ref 0.1–1.0)
Monocytes Relative: 9 %
Neutro Abs: 6.5 10*3/uL (ref 1.7–7.7)
Neutrophils Relative %: 79 %
Platelet Count: 296 10*3/uL (ref 150–400)
RBC: 4.02 MIL/uL — ABNORMAL LOW (ref 4.22–5.81)
RDW: 18.9 % — ABNORMAL HIGH (ref 11.5–15.5)
WBC Count: 8.3 10*3/uL (ref 4.0–10.5)
nRBC: 0 % (ref 0.0–0.2)

## 2019-03-03 MED ORDER — SODIUM CHLORIDE 0.9 % IV SOLN
75.0000 mg/m2 | Freq: Once | INTRAVENOUS | Status: AC
  Administered 2019-03-03: 11:00:00 140 mg via INTRAVENOUS
  Filled 2019-03-03: qty 14

## 2019-03-03 MED ORDER — SODIUM CHLORIDE 0.9 % IV SOLN
Freq: Once | INTRAVENOUS | Status: AC
  Administered 2019-03-03: 10:00:00 via INTRAVENOUS
  Filled 2019-03-03: qty 250

## 2019-03-03 MED ORDER — LEUPROLIDE ACETATE (4 MONTH) 30 MG ~~LOC~~ KIT
30.0000 mg | PACK | Freq: Once | SUBCUTANEOUS | Status: AC
  Administered 2019-03-03: 12:00:00 30 mg via SUBCUTANEOUS
  Filled 2019-03-03: qty 30

## 2019-03-03 MED ORDER — SODIUM CHLORIDE 0.9% FLUSH
10.0000 mL | INTRAVENOUS | Status: DC | PRN
  Administered 2019-03-03: 12:00:00 10 mL
  Filled 2019-03-03: qty 10

## 2019-03-03 MED ORDER — DEXAMETHASONE SODIUM PHOSPHATE 10 MG/ML IJ SOLN
10.0000 mg | Freq: Once | INTRAMUSCULAR | Status: AC
  Administered 2019-03-03: 10 mg via INTRAVENOUS

## 2019-03-03 MED ORDER — DEXAMETHASONE SODIUM PHOSPHATE 10 MG/ML IJ SOLN
INTRAMUSCULAR | Status: AC
  Filled 2019-03-03: qty 1

## 2019-03-03 MED ORDER — HEPARIN SOD (PORK) LOCK FLUSH 100 UNIT/ML IV SOLN
500.0000 [IU] | Freq: Once | INTRAVENOUS | Status: AC | PRN
  Administered 2019-03-03: 12:00:00 500 [IU]
  Filled 2019-03-03: qty 5

## 2019-03-03 MED ORDER — SODIUM CHLORIDE 0.9% FLUSH
10.0000 mL | INTRAVENOUS | Status: DC | PRN
  Administered 2019-03-03: 09:00:00 10 mL via INTRAVENOUS
  Filled 2019-03-03: qty 10

## 2019-03-03 NOTE — Patient Instructions (Signed)
Docetaxel injection What is this medicine? DOCETAXEL (doe se TAX el) is a chemotherapy drug. It targets fast dividing cells, like cancer cells, and causes these cells to die. This medicine is used to treat many types of cancers like breast cancer, certain stomach cancers, head and neck cancer, lung cancer, and prostate cancer. This medicine may be used for other purposes; ask your health care provider or pharmacist if you have questions. COMMON BRAND NAME(S): Docefrez, Taxotere What should I tell my health care provider before I take this medicine? They need to know if you have any of these conditions:  infection (especially a virus infection such as chickenpox, cold sores, or herpes)  liver disease  low blood counts, like low white cell, platelet, or red cell counts  an unusual or allergic reaction to docetaxel, polysorbate 80, other chemotherapy agents, other medicines, foods, dyes, or preservatives  pregnant or trying to get pregnant  breast-feeding How should I use this medicine? This drug is given as an infusion into a vein. It is administered in a hospital or clinic by a specially trained health care professional. Talk to your pediatrician regarding the use of this medicine in children. Special care may be needed. Overdosage: If you think you have taken too much of this medicine contact a poison control center or emergency room at once. NOTE: This medicine is only for you. Do not share this medicine with others. What if I miss a dose? It is important not to miss your dose. Call your doctor or health care professional if you are unable to keep an appointment. What may interact with this medicine?  aprepitant  certain antibiotics like erythromycin or clarithromycin  certain antivirals for HIV or hepatitis  certain medicines for fungal infections like fluconazole, itraconazole, ketoconazole, posaconazole, or  voriconazole  cimetidine  ciprofloxacin  conivaptan  cyclosporine  dronedarone  fluvoxamine  grapefruit juice  imatinib  verapamil This list may not describe all possible interactions. Give your health care provider a list of all the medicines, herbs, non-prescription drugs, or dietary supplements you use. Also tell them if you smoke, drink alcohol, or use illegal drugs. Some items may interact with your medicine. What should I watch for while using this medicine? Your condition will be monitored carefully while you are receiving this medicine. You will need important blood work done while you are taking this medicine. Call your doctor or health care professional for advice if you get a fever, chills or sore throat, or other symptoms of a cold or flu. Do not treat yourself. This drug decreases your body's ability to fight infections. Try to avoid being around people who are sick. Some products may contain alcohol. Ask your health care professional if this medicine contains alcohol. Be sure to tell all health care professionals you are taking this medicine. Certain medicines, like metronidazole and disulfiram, can cause an unpleasant reaction when taken with alcohol. The reaction includes flushing, headache, nausea, vomiting, sweating, and increased thirst. The reaction can last from 30 minutes to several hours. You may get drowsy or dizzy. Do not drive, use machinery, or do anything that needs mental alertness until you know how this medicine affects you. Do not stand or sit up quickly, especially if you are an older patient. This reduces the risk of dizzy or fainting spells. Alcohol may interfere with the effect of this medicine. Talk to your health care professional about your risk of cancer. You may be more at risk for certain types of cancer if  you take this medicine. Do not become pregnant while taking this medicine or for 6 months after stopping it. Women should inform their doctor if  they wish to become pregnant or think they might be pregnant. There is a potential for serious side effects to an unborn child. Talk to your health care professional or pharmacist for more information. Do not breast-feed an infant while taking this medicine or for 1 to 2 weeks after stopping it. Males who get this medicine must use a condom during sex with females who can get pregnant. If you get a woman pregnant, the baby could have birth defects. The baby could die before they are born. You will need to continue wearing a condom for 3 months after stopping the medicine. Tell your health care provider right away if your partner becomes pregnant while you are taking this medicine. This may interfere with the ability to father a child. You should talk to your doctor or health care professional if you are concerned about your fertility. What side effects may I notice from receiving this medicine? Side effects that you should report to your doctor or health care professional as soon as possible:  allergic reactions like skin rash, itching or hives, swelling of the face, lips, or tongue  blurred vision  breathing problems  changes in vision  low blood counts - This drug may decrease the number of white blood cells, red blood cells and platelets. You may be at increased risk for infections and bleeding.  nausea and vomiting  pain, redness or irritation at site where injected  pain, tingling, numbness in the hands or feet  redness, blistering, peeling, or loosening of the skin, including inside the mouth  signs of decreased platelets or bleeding - bruising, pinpoint red spots on the skin, black, tarry stools, nosebleeds  signs of decreased red blood cells - unusually weak or tired, fainting spells, lightheadedness  signs of infection - fever or chills, cough, sore throat, pain or difficulty passing urine  swelling of the ankle, feet, hands Side effects that usually do not require medical  attention (report to your doctor or health care professional if they continue or are bothersome):  constipation  diarrhea  fingernail or toenail changes  hair loss  loss of appetite  mouth sores  muscle pain This list may not describe all possible side effects. Call your doctor for medical advice about side effects. You may report side effects to FDA at 1-800-FDA-1088. Where should I keep my medicine? This drug is given in a hospital or clinic and will not be stored at home. NOTE: This sheet is a summary. It may not cover all possible information. If you have questions about this medicine, talk to your doctor, pharmacist, or health care provider.  2020 Elsevier/Gold Standard (2018-08-13 12:23:11)  Leuprolide depot injection What is this medicine? LEUPROLIDE (loo PROE lide) is a man-made protein that acts like a natural hormone in the body. It decreases testosterone in men and decreases estrogen in women. In men, this medicine is used to treat advanced prostate cancer. In women, some forms of this medicine may be used to treat endometriosis, uterine fibroids, or other male hormone-related problems. This medicine may be used for other purposes; ask your health care provider or pharmacist if you have questions. COMMON BRAND NAME(S): Eligard, Fensolv, Lupron Depot, Lupron Depot-Ped, Viadur What should I tell my health care provider before I take this medicine? They need to know if you have any of these conditions:  diabetes  heart disease or previous heart attack  high blood pressure  high cholesterol  mental illness  osteoporosis  pain or difficulty passing urine  seizures  spinal cord metastasis  stroke  suicidal thoughts, plans, or attempt; a previous suicide attempt by you or a family member  tobacco smoker  unusual vaginal bleeding (women)  an unusual or allergic reaction to leuprolide, benzyl alcohol, other medicines, foods, dyes, or  preservatives  pregnant or trying to get pregnant  breast-feeding How should I use this medicine? This medicine is for injection into a muscle or for injection under the skin. It is given by a health care professional in a hospital or clinic setting. The specific product will determine how it will be given to you. Make sure you understand which product you receive and how often you will receive it. Talk to your pediatrician regarding the use of this medicine in children. Special care may be needed. Overdosage: If you think you have taken too much of this medicine contact a poison control center or emergency room at once. NOTE: This medicine is only for you. Do not share this medicine with others. What if I miss a dose? It is important not to miss a dose. Call your doctor or health care professional if you are unable to keep an appointment. Depot injections: Depot injections are given either once-monthly, every 12 weeks, every 16 weeks, or every 24 weeks depending on the product you are prescribed. The product you are prescribed will be based on if you are male or male, and your condition. Make sure you understand your product and dosing. What may interact with this medicine? Do not take this medicine with any of the following medications:  chasteberry This medicine may also interact with the following medications:  herbal or dietary supplements, like black cohosh or DHEA  male hormones, like estrogens or progestins and birth control pills, patches, rings, or injections  male hormones, like testosterone This list may not describe all possible interactions. Give your health care provider a list of all the medicines, herbs, non-prescription drugs, or dietary supplements you use. Also tell them if you smoke, drink alcohol, or use illegal drugs. Some items may interact with your medicine. What should I watch for while using this medicine? Visit your doctor or health care professional for  regular checks on your progress. During the first weeks of treatment, your symptoms may get worse, but then will improve as you continue your treatment. You may get hot flashes, increased bone pain, increased difficulty passing urine, or an aggravation of nerve symptoms. Discuss these effects with your doctor or health care professional, some of them may improve with continued use of this medicine. Male patients may experience a menstrual cycle or spotting during the first months of therapy with this medicine. If this continues, contact your doctor or health care professional. This medicine may increase blood sugar. Ask your healthcare provider if changes in diet or medicines are needed if you have diabetes. What side effects may I notice from receiving this medicine? Side effects that you should report to your doctor or health care professional as soon as possible:  allergic reactions like skin rash, itching or hives, swelling of the face, lips, or tongue  breathing problems  chest pain  depression or memory disorders  pain in your legs or groin  pain at site where injected or implanted  seizures  severe headache  signs and symptoms of high blood sugar such as being more  thirsty or hungry or having to urinate more than normal. You may also feel very tired or have blurry vision  swelling of the feet and legs  suicidal thoughts or other mood changes  visual changes  vomiting Side effects that usually do not require medical attention (report to your doctor or health care professional if they continue or are bothersome):  breast swelling or tenderness  decrease in sex drive or performance  diarrhea  hot flashes  loss of appetite  muscle, joint, or bone pains  nausea  redness or irritation at site where injected or implanted  skin problems or acne This list may not describe all possible side effects. Call your doctor for medical advice about side effects. You may  report side effects to FDA at 1-800-FDA-1088. Where should I keep my medicine? This drug is given in a hospital or clinic and will not be stored at home. NOTE: This sheet is a summary. It may not cover all possible information. If you have questions about this medicine, talk to your doctor, pharmacist, or health care provider.  2020 Elsevier/Gold Standard (2018-04-08 09:27:03)

## 2019-03-03 NOTE — Patient Instructions (Signed)

## 2019-03-04 LAB — PROSTATE-SPECIFIC AG, SERUM (LABCORP): Prostate Specific Ag, Serum: 172 ng/mL — ABNORMAL HIGH (ref 0.0–4.0)

## 2019-03-05 ENCOUNTER — Inpatient Hospital Stay: Payer: Medicare Other

## 2019-03-05 ENCOUNTER — Other Ambulatory Visit: Payer: Self-pay

## 2019-03-05 VITALS — BP 118/83 | HR 80 | Temp 98.2°F | Resp 20

## 2019-03-05 DIAGNOSIS — Z5111 Encounter for antineoplastic chemotherapy: Secondary | ICD-10-CM | POA: Diagnosis not present

## 2019-03-05 DIAGNOSIS — C61 Malignant neoplasm of prostate: Secondary | ICD-10-CM

## 2019-03-05 MED ORDER — PEGFILGRASTIM-JMDB 6 MG/0.6ML ~~LOC~~ SOSY
6.0000 mg | PREFILLED_SYRINGE | Freq: Once | SUBCUTANEOUS | Status: AC
  Administered 2019-03-05: 6 mg via SUBCUTANEOUS

## 2019-03-05 NOTE — Patient Instructions (Signed)

## 2019-03-24 ENCOUNTER — Inpatient Hospital Stay: Payer: Medicare Other

## 2019-03-24 ENCOUNTER — Inpatient Hospital Stay: Payer: Medicare Other | Attending: Oncology | Admitting: Oncology

## 2019-03-24 ENCOUNTER — Other Ambulatory Visit: Payer: Self-pay

## 2019-03-24 VITALS — BP 144/78 | HR 74 | Temp 98.2°F | Resp 18 | Wt 159.8 lb

## 2019-03-24 DIAGNOSIS — C7951 Secondary malignant neoplasm of bone: Secondary | ICD-10-CM | POA: Insufficient documentation

## 2019-03-24 DIAGNOSIS — Z23 Encounter for immunization: Secondary | ICD-10-CM | POA: Insufficient documentation

## 2019-03-24 DIAGNOSIS — C61 Malignant neoplasm of prostate: Secondary | ICD-10-CM | POA: Insufficient documentation

## 2019-03-24 DIAGNOSIS — Z5189 Encounter for other specified aftercare: Secondary | ICD-10-CM | POA: Diagnosis not present

## 2019-03-24 DIAGNOSIS — Z5111 Encounter for antineoplastic chemotherapy: Secondary | ICD-10-CM | POA: Diagnosis present

## 2019-03-24 DIAGNOSIS — Z95828 Presence of other vascular implants and grafts: Secondary | ICD-10-CM

## 2019-03-24 LAB — CMP (CANCER CENTER ONLY)
ALT: 8 U/L (ref 0–44)
AST: 16 U/L (ref 15–41)
Albumin: 3.2 g/dL — ABNORMAL LOW (ref 3.5–5.0)
Alkaline Phosphatase: 82 U/L (ref 38–126)
Anion gap: 9 (ref 5–15)
BUN: 22 mg/dL (ref 8–23)
CO2: 26 mmol/L (ref 22–32)
Calcium: 8.7 mg/dL — ABNORMAL LOW (ref 8.9–10.3)
Chloride: 107 mmol/L (ref 98–111)
Creatinine: 0.92 mg/dL (ref 0.61–1.24)
GFR, Est AFR Am: >60 mL/min (ref >60)
GFR, Estimated: >60 mL/min (ref >60)
Glucose, Bld: 122 mg/dL — ABNORMAL HIGH (ref 70–99)
Potassium: 3.5 mmol/L (ref 3.5–5.1)
Sodium: 142 mmol/L (ref 135–145)
Total Bilirubin: 0.3 mg/dL (ref 0.3–1.2)
Total Protein: 6.3 g/dL — ABNORMAL LOW (ref 6.5–8.1)

## 2019-03-24 LAB — CBC WITH DIFFERENTIAL (CANCER CENTER ONLY)
Abs Immature Granulocytes: 0.02 10*3/uL (ref 0.00–0.07)
Basophils Absolute: 0 10*3/uL (ref 0.0–0.1)
Basophils Relative: 0 %
Eosinophils Absolute: 0 10*3/uL (ref 0.0–0.5)
Eosinophils Relative: 0 %
HCT: 30.9 % — ABNORMAL LOW (ref 39.0–52.0)
Hemoglobin: 9.7 g/dL — ABNORMAL LOW (ref 13.0–17.0)
Immature Granulocytes: 0 %
Lymphocytes Relative: 10 %
Lymphs Abs: 0.8 10*3/uL (ref 0.7–4.0)
MCH: 26.3 pg (ref 26.0–34.0)
MCHC: 31.4 g/dL (ref 30.0–36.0)
MCV: 83.7 fL (ref 80.0–100.0)
Monocytes Absolute: 0.4 10*3/uL (ref 0.1–1.0)
Monocytes Relative: 5 %
Neutro Abs: 6.4 10*3/uL (ref 1.7–7.7)
Neutrophils Relative %: 85 %
Platelet Count: 332 10*3/uL (ref 150–400)
RBC: 3.69 MIL/uL — ABNORMAL LOW (ref 4.22–5.81)
RDW: 20.3 % — ABNORMAL HIGH (ref 11.5–15.5)
WBC Count: 7.6 10*3/uL (ref 4.0–10.5)
nRBC: 0 % (ref 0.0–0.2)

## 2019-03-24 MED ORDER — HEPARIN SOD (PORK) LOCK FLUSH 100 UNIT/ML IV SOLN
500.0000 [IU] | Freq: Once | INTRAVENOUS | Status: AC | PRN
  Administered 2019-03-24: 500 [IU]
  Filled 2019-03-24: qty 5

## 2019-03-24 MED ORDER — SODIUM CHLORIDE 0.9 % IV SOLN
Freq: Once | INTRAVENOUS | Status: AC
  Administered 2019-03-24: 09:00:00 via INTRAVENOUS
  Filled 2019-03-24: qty 250

## 2019-03-24 MED ORDER — DEXAMETHASONE SODIUM PHOSPHATE 10 MG/ML IJ SOLN
10.0000 mg | Freq: Once | INTRAMUSCULAR | Status: AC
  Administered 2019-03-24: 10 mg via INTRAVENOUS

## 2019-03-24 MED ORDER — SODIUM CHLORIDE 0.9% FLUSH
10.0000 mL | INTRAVENOUS | Status: DC | PRN
  Administered 2019-03-24: 11:00:00 10 mL
  Filled 2019-03-24: qty 10

## 2019-03-24 MED ORDER — LEUPROLIDE ACETATE (4 MONTH) 30 MG ~~LOC~~ KIT: 30.0000 mg | PACK | Freq: Once | SUBCUTANEOUS | Status: DC

## 2019-03-24 MED ORDER — SODIUM CHLORIDE 0.9 % IV SOLN
75.0000 mg/m2 | Freq: Once | INTRAVENOUS | Status: AC
  Administered 2019-03-24: 140 mg via INTRAVENOUS
  Filled 2019-03-24: qty 14

## 2019-03-24 MED ORDER — SODIUM CHLORIDE 0.9% FLUSH
10.0000 mL | Freq: Once | INTRAVENOUS | Status: AC
  Administered 2019-03-24: 10 mL
  Filled 2019-03-24: qty 10

## 2019-03-24 MED ORDER — DENOSUMAB 120 MG/1.7ML ~~LOC~~ SOLN
120.0000 mg | Freq: Once | SUBCUTANEOUS | Status: AC
  Administered 2019-03-24: 10:00:00 120 mg via SUBCUTANEOUS

## 2019-03-24 MED ORDER — INFLUENZA VAC A&B SA ADJ QUAD 0.5 ML IM PRSY
0.5000 mL | PREFILLED_SYRINGE | Freq: Once | INTRAMUSCULAR | Status: AC
  Administered 2019-03-24: 0.5 mL via INTRAMUSCULAR

## 2019-03-24 NOTE — Progress Notes (Signed)
Patient's calcium corrected to 9.3 per pharmacy for [facility] administration. Patient stated that he is taking about 4 TUMS daily.

## 2019-03-24 NOTE — Patient Instructions (Signed)
[facility] Discharge Instructions for Patients Receiving Chemotherapy  Today you received the following chemotherapy agents: Taxotere  To help prevent nausea and vomiting after your treatment, we encourage you to take your nausea medication as directed.    If you develop nausea and vomiting that is not controlled by your nausea medication, call the clinic.   BELOW ARE SYMPTOMS THAT SHOULD BE REPORTED IMMEDIATELY:  *FEVER GREATER THAN 100.5 F  *CHILLS WITH OR WITHOUT FEVER  NAUSEA AND VOMITING THAT IS NOT CONTROLLED WITH YOUR NAUSEA MEDICATION  *UNUSUAL SHORTNESS OF BREATH  *UNUSUAL BRUISING OR BLEEDING  TENDERNESS IN MOUTH AND THROAT WITH OR WITHOUT PRESENCE OF ULCERS  *URINARY PROBLEMS  *BOWEL PROBLEMS  UNUSUAL RASH Items with * indicate a potential emergency and should be followed up as soon as possible.  Feel free to call the clinic should you have any questions or concerns. The clinic phone number is [phone].  Please show the CHEMO ALERT CARD at check-in to the Emergency Department and triage nurse.   

## 2019-03-24 NOTE — Progress Notes (Signed)
Hematology and Oncology Follow Up Visit  [name] Mathews [id number] 08-20-1945 73 y.o. 03/24/2019 8:47 AM    Principle Diagnosis: 73 year old man with advanced prostate cancer with disease to the bone and lymphadenopathy diagnosed in 2013.  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   Prior Therapy: 1. He was started on Lupron after attempted prostatectomy in 2001.    2. 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  3. He is status post radiation therapy to the prostate fossa completed in December 2018. He received 52.5 gr 20 fractions.  4. Zytiga 1000 mg po daily with prednisone 5 mg started in 09/2011.   Current therapy:   Xgeva 120 mg started on 10/31/2011.  This will be given every 6 weeks with chemotherapy.  Lupron 30 mg every 4 months.  Next Eligard will be given in January 2021.   Taxotere chemotherapy 75 mg per metered square with cycle 1 started on 01/19/2019.  He is here for cycle 4 of therapy.   Interim History: [name] is here for a follow-up visit.  Since the last visit, he continues to tolerate chemotherapy without any major complications.  He denies any nausea, vomiting or neuropathy.  He denies any infusion related complications.  He does report changes in his taste of food which has affected his intake.  He is still able to eat and maintain adequate nutrition and maintains his weight.  He remains active and continues to attend activities of daily living including working outside of his house.  Patient denied any alteration mental status, neuropathy, confusion or dizziness.  Denies any headaches or lethargy.  Denies any night sweats, weight loss or changes in appetite.  Denied orthopnea, dyspnea on exertion or chest discomfort.  Denies shortness of breath, difficulty breathing hemoptysis or cough.  Denies any abdominal distention, nausea, early satiety or  dyspepsia.  Denies any hematuria, frequency, dysuria or nocturia.  Denies any skin irritation, dryness or rash.  Denies any ecchymosis or petechiae.  Denies any lymphadenopathy or clotting.  Denies any heat or cold intolerance.  Denies any anxiety or depression.  Remaining review of system is negative.            Medications: Without any changes on review. Current Outpatient Medications  Medication Sig Dispense Refill  . albuterol (PROVENTIL HFA;VENTOLIN HFA) 108 (90 Base) MCG/ACT inhaler Inhale 1-2 puffs into the lungs every 6 (six) hours as needed for wheezing or shortness of breath. 1 Inhaler 0  . atorvastatin (LIPITOR) 20 MG tablet Take 1 tablet (20 mg total) by mouth daily. 90 tablet 3  . azithromycin (ZITHROMAX) 250 MG tablet Take 2 tabs day 1, then 1 tab daily 6 each 0  . benzonatate (TESSALON) 100 MG capsule Take 1 capsule (100 mg total) by mouth every 8 (eight) hours. 21 capsule 0  . cetirizine (ZYRTEC) 5 MG tablet Take 1 tablet (5 mg total) by mouth daily. 30 tablet 1  . denosumab (XGEVA) 120 MG/1.7ML SOLN Inject 120 mg into the skin every 30 (thirty) days.    . DULoxetine (CYMBALTA) 60 MG capsule Take 1 capsule (60 mg total) by mouth daily. 90 capsule 3  . EPIPEN 2-PAK 0.3 MG/0.3ML SOAJ injection Reported on 01/04/2016    . gabapentin (NEURONTIN) 100 MG capsule Take 1 capsule (100 mg total) by mouth 3 (three) times daily. 90 capsule 0  . hydrochlorothiazide (HYDRODIURIL) 25 MG  tablet Take 1 tablet (25 mg total) by mouth daily. 90 tablet 3  . HYDROcodone-acetaminophen (NORCO/VICODIN) 5-325 MG tablet Take 2 tablets by mouth every 6 (six) hours as needed for severe pain. 10 tablet 0  . leuprolide (LUPRON) 30 MG injection Inject 30 mg into the muscle every 4 (four) months.    . lidocaine-prilocaine (EMLA) cream Apply 1 application topically as needed. 30 g 0  . predniSONE (DELTASONE) 10 MG tablet Take 1 tablet (10 mg total) by mouth daily with breakfast. 7 tablet 0  . predniSONE  (DELTASONE) 5 MG tablet TAKE 1 TABLET (5 MG TOTAL) BY MOUTH 2 (TWO) TIMES DAILY. 90 tablet 3  . prochlorperazine (COMPAZINE) 10 MG tablet Take 1 tablet (10 mg total) by mouth every 6 (six) hours as needed for nausea or vomiting. 30 tablet 0  . rOPINIRole (REQUIP) 2 MG tablet Take 1 tablet (2 mg total) by mouth at bedtime. 90 tablet 3  . ZYTIGA 250 MG tablet TAKE 4 TABLETS BY MOUTH ONCE DAILY AS DIRECTED.  TAKE 1 HOUR BEFORE OR 2 HOURS AFTER A MEAL 120 tablet 11   No current facility-administered medications for this visit.      Allergies:  Allergies  Allergen Reactions  . Bee Venom Anaphylaxis, Shortness Of Breath and Swelling    Tongue swelling.   . Wasp Venom Anaphylaxis, Shortness Of Breath and Swelling    Tongue swelling   . Percocet [Oxycodone-Acetaminophen] Palpitations    His past medical history, social history and family history updated without any change in review.    Physical Exam:   Blood pressure (!) 144/78, pulse 74, temperature 98.2 F (36.8 C), temperature source Temporal, resp. rate 18, weight 159 lb 12.8 oz (72.5 kg), SpO2 100 %.     ECOG: 0    General appearance: Comfortable appearing without any discomfort Head: Normocephalic without any trauma Oropharynx: Mucous membranes are moist and pink without any thrush or ulcers. Eyes: Pupils are equal and round reactive to light. Lymph nodes: No cervical, supraclavicular, inguinal or axillary lymphadenopathy.   Heart:regular rate and rhythm.  S1 and S2 without leg edema. Lung: Clear without any rhonchi or wheezes.  No dullness to percussion. Abdomin: Soft, nontender, nondistended with good bowel sounds.  No hepatosplenomegaly. Musculoskeletal: No joint deformity or effusion.  Full range of motion noted. Neurological: No deficits noted on motor, sensory and deep tendon reflex exam. Skin: No petechial rash or dryness.  Appeared moist.           Lab Results: Lab Results  Component Value Date   WBC  7.6 03/24/2019   HGB 9.7 (L) 03/24/2019   HCT 30.9 (L) 03/24/2019   MCV 83.7 03/24/2019   PLT 332 03/24/2019     Chemistry      Component Value Date/Time   NA 139 03/03/2019 0848   NA 141 05/20/2017 1425   K 4.0 03/03/2019 0848   K 4.3 05/20/2017 1425   CL 101 03/03/2019 0848   CL 105 12/08/2012 1504   CO2 30 03/03/2019 0848   CO2 31 (H) 05/20/2017 1425   BUN 23 03/03/2019 0848   BUN 17.8 05/20/2017 1425   CREATININE 1.01 03/03/2019 0848   CREATININE 0.9 05/20/2017 1425      Component Value Date/Time   CALCIUM 8.7 (L) 03/03/2019 0848   CALCIUM 9.4 05/20/2017 1425   ALKPHOS 113 03/03/2019 0848   ALKPHOS 72 05/20/2017 1425   AST 20 03/03/2019 0848   AST 15 05/20/2017 1425   ALT  12 03/03/2019 0848   ALT <6 05/20/2017 1425   BILITOT 0.3 03/03/2019 0848   BILITOT 0.25 05/20/2017 1425        Results for JULIANO, HEPBURN (MRN [id number]) as of 03/24/2019 08:24  Ref. Range 12/30/2018 11:35 01/19/2019 08:57 02/10/2019 08:55 03/03/2019 08:48  Prostate Specific Ag, Serum Latest Ref Range: 0.0 - 4.0 ng/mL 100.0 (H) 102.0 (H) 225.0 (H) 172.0 (H)        Impression and Plan:   73 year old man with:    1.  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  He is currently on Taxotere chemotherapy and completed 3 cycles.  His last PSA does indicate positive response currently declining to 172.  Risks and benefits of continuing this therapy and long-term complications were reiterated.  These complications include peripheral neuropathy, myelosuppression as well as GI toxicities.  At this time he is agreeable to continue and the plan is to complete 10 cycles of therapy if possible.   2. Bone directed therapy: I recommended continuing Xgeva every 6 weeks.  Long-term complications including osteonecrosis of the jaw and hypocalcemia were reiterated.  He will receive it today and repeated in 6 weeks.   3. Androgen deprivation: He  received Eligard 30 mg on 03/03/2019.  This will be repeated in 4 months.  Complication associated with this therapy include hot flashes and weight gain.  4.  Back pain: He has not reported any worsening back pain at this time.   5.  IV access: Port-A-Cath and remains in use without any issues.  6.  Antiemetics: Very little nausea or vomiting reported at this time.  Compazine is available to him.  7.  Growth factor support: He will continue to receive growth factor support after each cycle of therapy.  He is at risk of developing neutropenic sepsis without it.  8.  Changes of taste and appetite: This is related to chemotherapy although he is doing a good job of finding ways to maintain adequate nutrition.  His weight is stable we will continue to emphasize the importance of maintaining a balanced nutrition.  9.  Followup: He will return in 3 weeks for the next cycle of therapy.   25  minutes was spent with the patient face-to-face today.  More than 50% of time was dedicated to updating his disease status, treatment options and complications with therapy.   Zola Button, MD 10/1/20208:47 AM

## 2019-03-25 ENCOUNTER — Telehealth: Payer: Self-pay

## 2019-03-25 LAB — PROSTATE-SPECIFIC AG, SERUM (LABCORP): Prostate Specific Ag, Serum: 110 ng/mL — ABNORMAL HIGH (ref 0.0–4.0)

## 2019-03-25 NOTE — Telephone Encounter (Signed)
Called patient and made him aware of PSA results. Verbalized understanding.  

## 2019-03-25 NOTE — Telephone Encounter (Signed)
-----   Message from [name], MD sent at 03/25/2019  8:17 AM EDT ----- Please let him know his PSA is down.

## 2019-03-26 ENCOUNTER — Other Ambulatory Visit: Payer: Self-pay

## 2019-03-26 ENCOUNTER — Inpatient Hospital Stay: Payer: Medicare Other

## 2019-03-26 VITALS — BP 159/80 | HR 100 | Temp 98.7°F | Resp 18

## 2019-03-26 DIAGNOSIS — Z5111 Encounter for antineoplastic chemotherapy: Secondary | ICD-10-CM | POA: Diagnosis not present

## 2019-03-26 DIAGNOSIS — C61 Malignant neoplasm of prostate: Secondary | ICD-10-CM

## 2019-03-26 MED ORDER — PEGFILGRASTIM-JMDB 6 MG/0.6ML ~~LOC~~ SOSY
6.0000 mg | PREFILLED_SYRINGE | Freq: Once | SUBCUTANEOUS | Status: AC
  Administered 2019-03-26: 6 mg via SUBCUTANEOUS

## 2019-03-29 ENCOUNTER — Telehealth: Payer: Self-pay | Admitting: Oncology

## 2019-03-29 NOTE — Telephone Encounter (Signed)
Scheduled per sch msg. Called and spoke with patient. Confirmed appt  

## 2019-03-31 ENCOUNTER — Telehealth: Payer: Self-pay

## 2019-03-31 NOTE — Telephone Encounter (Signed)
-----   Message from [name], MD sent at 03/31/2019 12:40 PM EDT ----- Noted. I will address next visit. Thanks.  ----- Message ----- From: Scot Dock, RN Sent: 03/31/2019  12:22 PM EDT To: [name], MD  Patient stated that since Tuesday night he has had tingling of all fingers and toes, no pain or decreased sensation associated with it. He has also had swelling of his left foot and ankle that does not resolve with elevation, no pain or other symptoms assoc with the edema as well. He has gabapentin on his med list but states that is an old medication and he no longer takes that. He just wanted you to be aware of the changes. His next appt is 10/21, L-[id number]

## 2019-03-31 NOTE — Telephone Encounter (Signed)
Called patient back and left message that Dr. Alen Blew will address his concerns on the next scheduled visit and to call back with any other questions or concerns.

## 2019-04-02 ENCOUNTER — Emergency Department (HOSPITAL_COMMUNITY)
Admission: EM | Admit: 2019-04-02 | Discharge: 2019-04-02 | Disposition: A | Discharge status: Home / Self Care | Payer: Medicare Other | Attending: Emergency Medicine | Admitting: Emergency Medicine

## 2019-04-02 ENCOUNTER — Other Ambulatory Visit: Payer: Self-pay | Admitting: Family Medicine

## 2019-04-02 ENCOUNTER — Other Ambulatory Visit: Payer: Self-pay

## 2019-04-02 ENCOUNTER — Encounter (HOSPITAL_COMMUNITY): Payer: Self-pay | Admitting: Emergency Medicine

## 2019-04-02 DIAGNOSIS — R202 Paresthesia of skin: Secondary | ICD-10-CM | POA: Diagnosis present

## 2019-04-02 DIAGNOSIS — I10 Essential (primary) hypertension: Secondary | ICD-10-CM | POA: Diagnosis not present

## 2019-04-02 DIAGNOSIS — M792 Neuralgia and neuritis, unspecified: Secondary | ICD-10-CM

## 2019-04-02 DIAGNOSIS — C7951 Secondary malignant neoplasm of bone: Secondary | ICD-10-CM | POA: Diagnosis not present

## 2019-04-02 DIAGNOSIS — Z87891 Personal history of nicotine dependence: Secondary | ICD-10-CM | POA: Insufficient documentation

## 2019-04-02 DIAGNOSIS — E876 Hypokalemia: Secondary | ICD-10-CM | POA: Insufficient documentation

## 2019-04-02 DIAGNOSIS — C61 Malignant neoplasm of prostate: Secondary | ICD-10-CM | POA: Diagnosis not present

## 2019-04-02 DIAGNOSIS — Z79899 Other long term (current) drug therapy: Secondary | ICD-10-CM | POA: Diagnosis not present

## 2019-04-02 LAB — CBC WITH DIFFERENTIAL/PLATELET
Abs Immature Granulocytes: 3.13 10*3/uL — ABNORMAL HIGH (ref 0.00–0.07)
Basophils Absolute: 0 10*3/uL (ref 0.0–0.1)
Basophils Relative: 0 %
Eosinophils Absolute: 0.1 10*3/uL (ref 0.0–0.5)
Eosinophils Relative: 0 %
HCT: 30.3 % — ABNORMAL LOW (ref 39.0–52.0)
Hemoglobin: 9.3 g/dL — ABNORMAL LOW (ref 13.0–17.0)
Immature Granulocytes: 12 %
Lymphocytes Relative: 8 %
Lymphs Abs: 2.2 10*3/uL (ref 0.7–4.0)
MCH: 26.5 pg (ref 26.0–34.0)
MCHC: 30.7 g/dL (ref 30.0–36.0)
MCV: 86.3 fL (ref 80.0–100.0)
Monocytes Absolute: 1.8 10*3/uL — ABNORMAL HIGH (ref 0.1–1.0)
Monocytes Relative: 7 %
Neutro Abs: 19.1 10*3/uL — ABNORMAL HIGH (ref 1.7–7.7)
Neutrophils Relative %: 73 %
Platelets: 260 10*3/uL (ref 150–400)
RBC: 3.51 MIL/uL — ABNORMAL LOW (ref 4.22–5.81)
RDW: 20.7 % — ABNORMAL HIGH (ref 11.5–15.5)
WBC: 26.3 10*3/uL — ABNORMAL HIGH (ref 4.0–10.5)
nRBC: 0.5 % — ABNORMAL HIGH (ref 0.0–0.2)

## 2019-04-02 LAB — BASIC METABOLIC PANEL
Anion gap: 11 (ref 5–15)
BUN: 22 mg/dL (ref 8–23)
CO2: 26 mmol/L (ref 22–32)
Calcium: 8.3 mg/dL — ABNORMAL LOW (ref 8.9–10.3)
Chloride: 103 mmol/L (ref 98–111)
Creatinine, Ser: 1.28 mg/dL — ABNORMAL HIGH (ref 0.61–1.24)
GFR calc Af Amer: >60 mL/min (ref >60)
GFR calc non Af Amer: 55 mL/min — ABNORMAL LOW (ref >60)
Glucose, Bld: 116 mg/dL — ABNORMAL HIGH (ref 70–99)
Potassium: 3.2 mmol/L — ABNORMAL LOW (ref 3.5–5.1)
Sodium: 140 mmol/L (ref 135–145)

## 2019-04-02 LAB — MAGNESIUM: Magnesium: 1.7 mg/dL (ref 1.7–2.4)

## 2019-04-02 MED ORDER — MAGNESIUM CHLORIDE 64 MG PO TBEC: 1.0000 | DELAYED_RELEASE_TABLET | Freq: Two times a day (BID) | ORAL | 0 refills | Status: DC

## 2019-04-02 MED ORDER — POTASSIUM CHLORIDE CRYS ER 20 MEQ PO TBCR: 20.0000 meq | EXTENDED_RELEASE_TABLET | Freq: Two times a day (BID) | ORAL | 0 refills | Status: DC

## 2019-04-02 MED ORDER — POTASSIUM CHLORIDE CRYS ER 20 MEQ PO TBCR
40.0000 meq | EXTENDED_RELEASE_TABLET | Freq: Once | ORAL | Status: AC
  Administered 2019-04-02: 40 meq via ORAL
  Filled 2019-04-02: qty 2

## 2019-04-02 MED ORDER — MAGNESIUM CHLORIDE 64 MG PO TBEC
2.0000 | DELAYED_RELEASE_TABLET | Freq: Once | ORAL | Status: AC
  Administered 2019-04-02: 128 mg via ORAL
  Filled 2019-04-02: qty 2

## 2019-04-02 NOTE — ED Triage Notes (Signed)
Patient is complaining of hands tingling and his feet being cold. Patient states this started 3 days ago. Patient states that chemo started January 17, 2019. Patient states this the first time this has happened.

## 2019-04-02 NOTE — ED Provider Notes (Signed)
[facility] DEPT Provider Note   CSN: [id number] Arrival date & time: 04/02/19  0057    History   Chief Complaint Chief Complaint  Patient presents with  . Cold Feet  . Tingling    HPI [name] Mathews is a 73 y.o. male.   The history is provided by the patient.  He has history of hypertension, hyperlipidemia, prostate cancer, restless leg syndrome and comes in complaining of his legs feeling cold and some tingling in his hands.  This has been present for the last 4 days.  He is also complaining some aching deep inside his left leg going from the knee to the ankle.  Pain is rated at 5/10.  He denies any new medications, but was recently started on something new for his prostate cancer.  Nothing seems to make the symptoms better.  It is worse when he lays down.  Past Medical History:  Diagnosis Date  . Hyperlipidemia   . Hypertension   . Prostate cancer ([place])    metastasis bones  . Restless leg syndrome     Patient Active Problem List   Diagnosis Date Noted  . Port-A-Cath in place 03/24/2019  . Goals of care, counseling/discussion 01/05/2019  . History of cluster headache 08/12/2017  . Neuropathic pain 02/09/2017  . Routine adult health maintenance 01/08/2017  . Essential hypertension 01/08/2017  . Hyperlipidemia 01/08/2017  . Cervical lymphadenopathy 01/08/2017  . Back pain 09/08/2014  . 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 IR CV LINE INJECTION  02/15/2019  . IR IMAGING GUIDED PORT INSERTION  01/11/2019  . PELVIC LYMPH NODE DISSECTION     prostate remains intact        Home Medications    Prior to Admission medications   Medication Sig Start Date End Date Taking? Authorizing Provider  atorvastatin (LIPITOR) 20 MG tablet Take 1 tablet (20 mg total) by mouth daily. 01/12/18  Yes Guadalupe Dawn, MD  denosumab (XGEVA) 120 MG/1.7ML SOLN Inject 120 mg into the skin every  30 (thirty) days.   Yes [provider]  DULoxetine (CYMBALTA) 60 MG capsule Take 1 capsule (60 mg total) by mouth daily. 05/10/18  Yes Guadalupe Dawn, MD  hydrochlorothiazide (HYDRODIURIL) 25 MG tablet Take 1 tablet (25 mg total) by mouth daily. 09/08/18  Yes Lockamy, Timothy, DO  predniSONE (DELTASONE) 5 MG tablet TAKE 1 TABLET (5 MG TOTAL) BY MOUTH 2 (TWO) TIMES DAILY. Patient taking differently: Take 5 mg by mouth 2 (two) times daily with a meal.  02/12/18  Yes [name], [name], MD  prochlorperazine (COMPAZINE) 10 MG tablet Take 1 tablet (10 mg total) by mouth every 6 (six) hours as needed for nausea or vomiting. 01/05/19  Yes [name], MD  rOPINIRole (REQUIP) 2 MG tablet Take 1 tablet (2 mg total) by mouth at bedtime. 05/10/18  Yes Guadalupe Dawn, MD  albuterol (PROVENTIL HFA;VENTOLIN HFA) 108 (90 Base) MCG/ACT inhaler Inhale 1-2 puffs into the lungs every 6 (six) hours as needed for wheezing or shortness of breath. 09/12/18   Loura Halt A, NP  azithromycin (ZITHROMAX) 250 MG tablet Take 2 tabs day 1, then 1 tab daily Patient not taking: Reported on 04/02/2019 09/21/18   Leeanne Rio, MD  benzonatate (TESSALON) 100 MG capsule Take 1 capsule (100 mg total) by mouth every 8 (eight) hours. Patient not taking: Reported on 04/02/2019 09/12/18   Orvan July, NP  cetirizine (ZYRTEC) 5  MG tablet Take 1 tablet (5 mg total) by mouth daily. Patient not taking: Reported on 04/02/2019 09/12/18   Loura Halt A, NP  EPIPEN 2-PAK 0.3 MG/0.3ML SOAJ injection Inject 0.3 mg into the muscle as needed for anaphylaxis.  10/27/13   [provider]  gabapentin (NEURONTIN) 100 MG capsule Take 1 capsule (100 mg total) by mouth 3 (three) times daily. Patient not taking: Reported on 04/02/2019 09/16/18   Guadalupe Dawn, MD  HYDROcodone-acetaminophen (NORCO/VICODIN) 5-325 MG tablet Take 2 tablets by mouth every 6 (six) hours as needed for severe pain. Patient not taking: Reported on 04/02/2019  08/15/18   Julianne Rice, MD  leuprolide (LUPRON) 30 MG injection Inject 30 mg into the muscle every 4 (four) months.    [provider]  lidocaine-prilocaine (EMLA) cream Apply 1 application topically as needed. Patient taking differently: Apply 1 application topically as needed (port pain).  01/05/19   [name], MD  predniSONE (DELTASONE) 10 MG tablet Take 1 tablet (10 mg total) by mouth daily with breakfast. Patient not taking: Reported on 04/02/2019 09/16/18   Guadalupe Dawn, MD  ZYTIGA 250 MG tablet TAKE 4 TABLETS BY MOUTH ONCE DAILY AS DIRECTED.  TAKE 1 HOUR BEFORE OR 2 HOURS AFTER A MEAL Patient not taking: Reported on 04/02/2019 11/24/18   [name], MD    Family History Family History  Problem Relation Age of Onset  . Heart attack Mother 83       died of heart attack  . Cervical cancer Sister 30  . Stroke Brother 60    Social History Social History   Tobacco Use  . Smoking status: Former Smoker    Packs/day: 0.25    Years: 6.00    Pack years: 1.50    Start date: 1969    Quit date: 1975    Years since quitting: 45.8  . Smokeless tobacco: Never Used  Substance Use Topics  . Alcohol use: No  . Drug use: No     Allergies   Bee venom, Wasp venom, and Percocet [oxycodone-acetaminophen]   Review of Systems Review of Systems  All other systems reviewed and are negative.    Physical Exam Updated Vital Signs BP (!) 141/84 (BP Location: Left Arm)   Pulse 77   Temp 99.1 F (37.3 C) (Oral)   Resp 15   Ht 5\' 5"  (1.651 m)   Wt 72.1 kg   SpO2 100%   BMI 26.46 kg/m   Physical Exam Vitals signs and nursing note reviewed.    73 year old male, resting comfortably and in no acute distress. Vital signs are significant for borderline elevated blood pressure. Oxygen saturation is 100%, which is normal. Head is normocephalic and atraumatic. PERRLA, EOMI. Oropharynx is clear. Neck is nontender and supple without adenopathy or JVD. Back is  nontender and there is no CVA tenderness. Lungs are clear without rales, wheezes, or rhonchi. Chest is nontender. Heart has regular rate and rhythm without murmur. Abdomen is soft, flat, nontender without masses or hepatosplenomegaly and peristalsis is normoactive. Extremities have no cyanosis or edema, full range of motion is present.  Feet and legs are warm to the touch.  Dorsalis pedis pulses are strong.  Capillary refill is prompt. Skin is warm and dry without rash. Neurologic: Mental status is normal, cranial nerves are intact, there are no motor or sensory deficits.  ED Treatments / Results  Labs (all labs ordered are listed, but only abnormal results are displayed) Labs Reviewed  BASIC METABOLIC PANEL - Abnormal; Notable for the following components:      Result Value   Potassium 3.2 (*)    Glucose, Bld 116 (*)    Creatinine, Ser 1.28 (*)    Calcium 8.3 (*)    GFR calc non Af Amer 55 (*)    All other components within normal limits  CBC WITH DIFFERENTIAL/PLATELET - Abnormal; Notable for the following components:   WBC 26.3 (*)    RBC 3.51 (*)    Hemoglobin 9.3 (*)    HCT 30.3 (*)    RDW 20.7 (*)    nRBC 0.5 (*)    Neutro Abs 19.1 (*)    Monocytes Absolute 1.8 (*)    Abs Immature Granulocytes 3.13 (*)    All other components within normal limits  MAGNESIUM   Procedures Procedures   Medications Ordered in ED Medications  potassium chloride SA (KLOR-CON) CR tablet 40 mEq (has no administration in time range)  magnesium chloride (SLOW-MAG) 64 MG SR tablet 128 mg (has no administration in time range)     Initial Impression / Assessment and Plan / ED Course  I have reviewed the triage vital signs and the nursing notes.  Pertinent lab results that were available during my care of the patient were reviewed by me and considered in my medical decision making (see chart for details).  Subjective sense of his legs being cold and some tingling in his hands.  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  No objective findings on exam.  Old records were reviewed confirming Taxotere being initiated in July of this year to treat prostate cancer.  We will check electrolytes and magnesium to see if there is anything that is correctable.  Labs do show hypokalemia.  It is noted that he does take hydrochlorothiazide and that is likely the cause of hypokalemia.  Magnesium is borderline.  He is given a dose of potassium and magnesium in the ED and sent home with prescriptions for both.  He is noted to have significant leukocytosis.  Cause of this is uncertain as he does not have any obvious infection.  This will need to be followed up with his oncologist.  Also, recommended that he discuss with his oncologist whether Taxotere might be causing some of his symptoms.  Final Clinical Impressions(s) / ED Diagnoses   Final diagnoses:  Tingling in extremities  Hypokalemia    ED Discharge Orders         Ordered    potassium chloride SA (KLOR-CON) 20 MEQ tablet  2 times daily     04/02/19 0347    magnesium chloride (SLOW-MAG) 64 MG TBEC SR tablet  2 times daily     04/02/19 [id number]           Delora Fuel, [place] [id number] [phone]

## 2019-04-02 NOTE — Discharge Instructions (Signed)
Talk with your oncologist to see if your Taxol could be causing some of your symptoms.

## 2019-04-04 ENCOUNTER — Other Ambulatory Visit: Payer: Self-pay | Admitting: Oncology

## 2019-04-09 ENCOUNTER — Encounter (HOSPITAL_COMMUNITY): Payer: Self-pay

## 2019-04-09 ENCOUNTER — Other Ambulatory Visit: Payer: Self-pay

## 2019-04-09 ENCOUNTER — Emergency Department (HOSPITAL_COMMUNITY)
Admission: EM | Admit: 2019-04-09 | Discharge: 2019-04-10 | Disposition: A | Discharge status: Home / Self Care | Payer: Medicare Other | Attending: Emergency Medicine | Admitting: Emergency Medicine

## 2019-04-09 DIAGNOSIS — Y999 Unspecified external cause status: Secondary | ICD-10-CM | POA: Insufficient documentation

## 2019-04-09 DIAGNOSIS — C7951 Secondary malignant neoplasm of bone: Secondary | ICD-10-CM | POA: Diagnosis not present

## 2019-04-09 DIAGNOSIS — I10 Essential (primary) hypertension: Secondary | ICD-10-CM | POA: Insufficient documentation

## 2019-04-09 DIAGNOSIS — Z79899 Other long term (current) drug therapy: Secondary | ICD-10-CM | POA: Diagnosis not present

## 2019-04-09 DIAGNOSIS — Z8546 Personal history of malignant neoplasm of prostate: Secondary | ICD-10-CM | POA: Insufficient documentation

## 2019-04-09 DIAGNOSIS — R1032 Left lower quadrant pain: Secondary | ICD-10-CM | POA: Diagnosis not present

## 2019-04-09 DIAGNOSIS — Z87891 Personal history of nicotine dependence: Secondary | ICD-10-CM | POA: Insufficient documentation

## 2019-04-09 DIAGNOSIS — Y9389 Activity, other specified: Secondary | ICD-10-CM | POA: Insufficient documentation

## 2019-04-09 DIAGNOSIS — M546 Pain in thoracic spine: Secondary | ICD-10-CM | POA: Diagnosis not present

## 2019-04-09 DIAGNOSIS — Y92410 Unspecified street and highway as the place of occurrence of the external cause: Secondary | ICD-10-CM | POA: Diagnosis not present

## 2019-04-09 MED ORDER — SODIUM CHLORIDE 0.9 % IV BOLUS
1000.0000 mL | Freq: Once | INTRAVENOUS | Status: AC
  Administered 2019-04-10: 1000 mL via INTRAVENOUS
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
## 2019-04-09 NOTE — ED Notes (Addendum)
Pt is requesting to speak with MD. He is getting upset about the wait.
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
## 2019-04-09 NOTE — ED Notes (Signed)
Pt has called once again wanting to know where MD is

## 2019-04-10 ENCOUNTER — Encounter (HOSPITAL_COMMUNITY): Payer: Self-pay

## 2019-04-10 ENCOUNTER — Emergency Department (HOSPITAL_COMMUNITY): Payer: Medicare Other

## 2019-04-10 LAB — CBC WITH DIFFERENTIAL/PLATELET
Abs Immature Granulocytes: 0.06 10*3/uL (ref 0.00–0.07)
Basophils Absolute: 0 10*3/uL (ref 0.0–0.1)
Basophils Relative: 0 %
Eosinophils Absolute: 0 10*3/uL (ref 0.0–0.5)
Eosinophils Relative: 0 %
HCT: 30.7 % — ABNORMAL LOW (ref 39.0–52.0)
Hemoglobin: 9.5 g/dL — ABNORMAL LOW (ref 13.0–17.0)
Immature Granulocytes: 1 %
Lymphocytes Relative: 13 %
Lymphs Abs: 1.4 10*3/uL (ref 0.7–4.0)
MCH: 26.9 pg (ref 26.0–34.0)
MCHC: 30.9 g/dL (ref 30.0–36.0)
MCV: 87 fL (ref 80.0–100.0)
Monocytes Absolute: 0.7 10*3/uL (ref 0.1–1.0)
Monocytes Relative: 7 %
Neutro Abs: 8.6 10*3/uL — ABNORMAL HIGH (ref 1.7–7.7)
Neutrophils Relative %: 79 %
Platelets: 234 10*3/uL (ref 150–400)
RBC: 3.53 MIL/uL — ABNORMAL LOW (ref 4.22–5.81)
RDW: 20.7 % — ABNORMAL HIGH (ref 11.5–15.5)
WBC: 10.8 10*3/uL — ABNORMAL HIGH (ref 4.0–10.5)
nRBC: 0.2 % (ref 0.0–0.2)

## 2019-04-10 LAB — COMPREHENSIVE METABOLIC PANEL
ALT: 8 U/L (ref 0–44)
AST: 18 U/L (ref 15–41)
Albumin: 3.1 g/dL — ABNORMAL LOW (ref 3.5–5.0)
Alkaline Phosphatase: 75 U/L (ref 38–126)
Anion gap: 8 (ref 5–15)
BUN: 24 mg/dL — ABNORMAL HIGH (ref 8–23)
CO2: 25 mmol/L (ref 22–32)
Calcium: 8.7 mg/dL — ABNORMAL LOW (ref 8.9–10.3)
Chloride: 105 mmol/L (ref 98–111)
Creatinine, Ser: 0.87 mg/dL (ref 0.61–1.24)
GFR calc Af Amer: >60 mL/min (ref >60)
GFR calc non Af Amer: >60 mL/min (ref >60)
Glucose, Bld: 91 mg/dL (ref 70–99)
Potassium: 4.1 mmol/L (ref 3.5–5.1)
Sodium: 138 mmol/L (ref 135–145)
Total Bilirubin: 0.6 mg/dL (ref 0.3–1.2)
Total Protein: 6.3 g/dL — ABNORMAL LOW (ref 6.5–8.1)

## 2019-04-10 MED ORDER — HEPARIN SOD (PORK) LOCK FLUSH 100 UNIT/ML IV SOLN
500.0000 [IU] | Freq: Once | INTRAVENOUS | Status: AC
  Administered 2019-04-10: 500 [IU]
  Filled 2019-04-10: qty 5

## 2019-04-10 MED ORDER — IOHEXOL 300 MG/ML  SOLN
100.0000 mL | Freq: Once | INTRAMUSCULAR | Status: AC | PRN
  Administered 2019-04-10: 100 mL via INTRAVENOUS

## 2019-04-10 MED ORDER — SODIUM CHLORIDE (PF) 0.9 % IJ SOLN
INTRAMUSCULAR | Status: AC
  Administered 2019-04-10: 03:00:00
  Filled 2019-04-10: qty 50

## 2019-04-13 ENCOUNTER — Inpatient Hospital Stay: Payer: Medicare Other

## 2019-04-13 ENCOUNTER — Other Ambulatory Visit: Payer: Self-pay

## 2019-04-13 ENCOUNTER — Inpatient Hospital Stay (HOSPITAL_BASED_OUTPATIENT_CLINIC_OR_DEPARTMENT_OTHER): Payer: Medicare Other | Admitting: Oncology

## 2019-04-13 ENCOUNTER — Telehealth: Payer: Self-pay | Admitting: Oncology

## 2019-04-13 VITALS — BP 123/65 | HR 73 | Temp 98.3°F | Resp 17 | Ht 65.0 in | Wt 160.7 lb

## 2019-04-13 DIAGNOSIS — Z95828 Presence of other vascular implants and grafts: Secondary | ICD-10-CM | POA: Diagnosis not present

## 2019-04-13 DIAGNOSIS — C61 Malignant neoplasm of prostate: Secondary | ICD-10-CM | POA: Diagnosis not present

## 2019-04-13 DIAGNOSIS — Z5111 Encounter for antineoplastic chemotherapy: Secondary | ICD-10-CM | POA: Diagnosis not present

## 2019-04-13 LAB — CMP (CANCER CENTER ONLY)
ALT: <6 U/L (ref 0–44)
AST: 15 U/L (ref 15–41)
Albumin: 3 g/dL — ABNORMAL LOW (ref 3.5–5.0)
Alkaline Phosphatase: 75 U/L (ref 38–126)
Anion gap: 9 (ref 5–15)
BUN: 21 mg/dL (ref 8–23)
CO2: 26 mmol/L (ref 22–32)
Calcium: 8.4 mg/dL — ABNORMAL LOW (ref 8.9–10.3)
Chloride: 105 mmol/L (ref 98–111)
Creatinine: 0.93 mg/dL (ref 0.61–1.24)
GFR, Est AFR Am: >60 mL/min (ref >60)
GFR, Estimated: >60 mL/min (ref >60)
Glucose, Bld: 107 mg/dL — ABNORMAL HIGH (ref 70–99)
Potassium: 3.8 mmol/L (ref 3.5–5.1)
Sodium: 140 mmol/L (ref 135–145)
Total Bilirubin: 0.3 mg/dL (ref 0.3–1.2)
Total Protein: 6.2 g/dL — ABNORMAL LOW (ref 6.5–8.1)

## 2019-04-13 LAB — CBC WITH DIFFERENTIAL (CANCER CENTER ONLY)
Abs Immature Granulocytes: 0.02 10*3/uL (ref 0.00–0.07)
Basophils Absolute: 0 10*3/uL (ref 0.0–0.1)
Basophils Relative: 0 %
Eosinophils Absolute: 0 10*3/uL (ref 0.0–0.5)
Eosinophils Relative: 0 %
HCT: 30.2 % — ABNORMAL LOW (ref 39.0–52.0)
Hemoglobin: 9.5 g/dL — ABNORMAL LOW (ref 13.0–17.0)
Immature Granulocytes: 0 %
Lymphocytes Relative: 15 %
Lymphs Abs: 0.9 10*3/uL (ref 0.7–4.0)
MCH: 26.5 pg (ref 26.0–34.0)
MCHC: 31.5 g/dL (ref 30.0–36.0)
MCV: 84.1 fL (ref 80.0–100.0)
Monocytes Absolute: 0.6 10*3/uL (ref 0.1–1.0)
Monocytes Relative: 10 %
Neutro Abs: 4.6 10*3/uL (ref 1.7–7.7)
Neutrophils Relative %: 75 %
Platelet Count: 317 10*3/uL (ref 150–400)
RBC: 3.59 MIL/uL — ABNORMAL LOW (ref 4.22–5.81)
RDW: 20 % — ABNORMAL HIGH (ref 11.5–15.5)
WBC Count: 6.2 10*3/uL (ref 4.0–10.5)
nRBC: 0 % (ref 0.0–0.2)

## 2019-04-13 MED ORDER — SODIUM CHLORIDE 0.9 % IV SOLN
75.0000 mg/m2 | Freq: Once | INTRAVENOUS | Status: AC
  Administered 2019-04-13: 140 mg via INTRAVENOUS
  Filled 2019-04-13: qty 14

## 2019-04-13 MED ORDER — DEXAMETHASONE SODIUM PHOSPHATE 10 MG/ML IJ SOLN
INTRAMUSCULAR | Status: AC
  Filled 2019-04-13: qty 1

## 2019-04-13 MED ORDER — SODIUM CHLORIDE 0.9% FLUSH
10.0000 mL | Freq: Once | INTRAVENOUS | Status: AC
  Administered 2019-04-13: 10 mL
  Filled 2019-04-13: qty 10

## 2019-04-13 MED ORDER — SODIUM CHLORIDE 0.9 % IV SOLN
Freq: Once | INTRAVENOUS | Status: AC
  Administered 2019-04-13: 14:00:00 via INTRAVENOUS
  Filled 2019-04-13: qty 250

## 2019-04-13 MED ORDER — HEPARIN SOD (PORK) LOCK FLUSH 100 UNIT/ML IV SOLN
500.0000 [IU] | Freq: Once | INTRAVENOUS | Status: AC | PRN
  Administered 2019-04-13: 500 [IU]
  Filled 2019-04-13: qty 5

## 2019-04-13 MED ORDER — DEXAMETHASONE SODIUM PHOSPHATE 10 MG/ML IJ SOLN
10.0000 mg | Freq: Once | INTRAMUSCULAR | Status: AC
  Administered 2019-04-13: 10 mg via INTRAVENOUS

## 2019-04-13 MED ORDER — SODIUM CHLORIDE 0.9% FLUSH
10.0000 mL | INTRAVENOUS | Status: DC | PRN
  Administered 2019-04-13: 10 mL
  Filled 2019-04-13: qty 10

## 2019-04-13 NOTE — Patient Instructions (Signed)
[facility] Discharge Instructions for Patients Receiving Chemotherapy  Today you received the following chemotherapy agents: Taxotere  To help prevent nausea and vomiting after your treatment, we encourage you to take your nausea medication as directed.    If you develop nausea and vomiting that is not controlled by your nausea medication, call the clinic.   BELOW ARE SYMPTOMS THAT SHOULD BE REPORTED IMMEDIATELY:  *FEVER GREATER THAN 100.5 F  *CHILLS WITH OR WITHOUT FEVER  NAUSEA AND VOMITING THAT IS NOT CONTROLLED WITH YOUR NAUSEA MEDICATION  *UNUSUAL SHORTNESS OF BREATH  *UNUSUAL BRUISING OR BLEEDING  TENDERNESS IN MOUTH AND THROAT WITH OR WITHOUT PRESENCE OF ULCERS  *URINARY PROBLEMS  *BOWEL PROBLEMS  UNUSUAL RASH Items with * indicate a potential emergency and should be followed up as soon as possible.  Feel free to call the clinic should you have any questions or concerns. The clinic phone number is [phone].  Please show the CHEMO ALERT CARD at check-in to the Emergency Department and triage nurse.   

## 2019-04-13 NOTE — Telephone Encounter (Signed)
Scheduled appt per 10/21 sch message - added additional appts to what was already scheduled. Pt to get an updated schedule next visit.

## 2019-04-13 NOTE — Progress Notes (Signed)
Hematology and Oncology Follow Up Visit  [name] Mathews [id number] [birth year] 73 y.o. 04/13/2019 1:30 PM    Principle Diagnosis: 73 year old man with castration-resistant prostate cancer with disease to the bone diagnosed in 2013.  He was diagnosed in 2001 with Gleason score of 4+5 = 9 and was found to have advanced disease.   Prior Therapy: 1. He was started on Lupron after attempted prostatectomy in 2001.    2. 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  3. He is status post radiation therapy to the prostate fossa completed in December 2018. He received 52.5 gr 20 fractions.  4. Zytiga 1000 mg po daily with prednisone 5 mg started in 09/2011.   Current therapy:   Xgeva 120 mg started on 10/31/2011.  This will be given every 6 weeks with chemotherapy.  Lupron 30 mg every 4 months.  Next Eligard will be given in January 2021.   Taxotere chemotherapy 75 mg per metered square with cycle 1 started on 01/19/2019.  He is here for cycle 5 of therapy.   Interim History: [name] Mathews returns today for a repeat evaluation.  Since the last visit, he reports no major changes in his health.  He continues to tolerate chemotherapy without complaints.  He does report some occasional nausea and have vomited previously.  But overall still able to eat and function reasonably well.  Denies any recent hospitalization or illnesses.  He denies any decline in his appetite.  He denies any worsening neuropathy or bone pain.  His performance status and quality of life remains unchanged.  He denied headaches, blurry vision, syncope or seizures.  Denies any fevers, chills or sweats.  Denied chest pain, palpitation, orthopnea or leg edema.  Denied cough, wheezing or hemoptysis.  Denied nausea, vomiting or abdominal pain.  Denies any constipation or diarrhea.  Denies any frequency urgency or hesitancy.  Denies any arthralgias or myalgias.  Denies any skin rashes  or lesions.  Denies any bleeding or clotting tendency.  Denies any easy bruising.  Denies any hair or nail changes.  Denies any anxiety or depression.  Remaining review of system is negative.            Medications: Updated without any changes. Current Outpatient Medications  Medication Sig Dispense Refill  . albuterol (PROVENTIL HFA;VENTOLIN HFA) 108 (90 Base) MCG/ACT inhaler Inhale 1-2 puffs into the lungs every 6 (six) hours as needed for wheezing or shortness of breath. 1 Inhaler 0  . atorvastatin (LIPITOR) 20 MG tablet Take 1 tablet (20 mg total) by mouth daily. 90 tablet 3  . denosumab (XGEVA) 120 MG/1.7ML SOLN Inject 120 mg into the skin every 30 (thirty) days.    . DULoxetine (CYMBALTA) 60 MG capsule Take 1 capsule (60 mg total) by mouth daily. 90 capsule 2  . EPIPEN 2-PAK 0.3 MG/0.3ML SOAJ injection Inject 0.3 mg into the muscle as needed for anaphylaxis.     . hydrochlorothiazide (HYDRODIURIL) 25 MG tablet Take 1 tablet (25 mg total) by mouth daily. 90 tablet 3  . leuprolide (LUPRON) 30 MG injection Inject 30 mg into the muscle every 4 (four) months.    . lidocaine-prilocaine (EMLA) cream Apply 1 application topically as needed. (Patient taking differently: Apply 1 application topically as needed (port pain). ) 30 g 0  . magnesium chloride (SLOW-MAG) 64 MG TBEC SR tablet Take 1 tablet (64 mg total) by mouth 2 (two) times daily. 20 tablet 0  .  potassium chloride SA (KLOR-CON) 20 MEQ tablet Take 1 tablet (20 mEq total) by mouth 2 (two) times daily. 30 tablet 0  . predniSONE (DELTASONE) 5 MG tablet TAKE 1 TABLET (5 MG TOTAL) BY MOUTH 2 (TWO) TIMES DAILY. (Patient taking differently: Take 5 mg by mouth 2 (two) times daily with a meal. ) 90 tablet 3  . prochlorperazine (COMPAZINE) 10 MG tablet TAKE 1 TABLET BY MOUTH EVERY 6 HOURS AS NEEDED FOR NAUSEA FOR VOMITING (Patient taking differently: Take 10 mg by mouth every 6 (six) hours as needed for nausea. ) 30 tablet 0  . rOPINIRole  (REQUIP) 2 MG tablet Take 1 tablet (2 mg total) by mouth at bedtime. 90 tablet 2   No current facility-administered medications for this visit.      Allergies:  Allergies  Allergen Reactions  . Bee Venom Anaphylaxis, Shortness Of Breath and Swelling    Tongue swelling.   . Wasp Venom Anaphylaxis, Shortness Of Breath and Swelling    Tongue swelling   . Percocet [Oxycodone-Acetaminophen] Palpitations    His past medical history, social history and family history unchanged on review.    Physical Exam:   Blood pressure 123/65, pulse 73, temperature 98.3 F (36.8 C), temperature source Oral, resp. rate 17, height 5\' 5"  (1.651 m), weight 160 lb 11.2 oz (72.9 kg), SpO2 98 %.     ECOG: 1   General appearance: Alert, awake without any distress. Head: Atraumatic without abnormalities Oropharynx: Without any thrush or ulcers. Eyes: No scleral icterus. Lymph nodes: No lymphadenopathy noted in the cervical, supraclavicular, or axillary nodes Heart:regular rate and rhythm, without any murmurs or gallops.   Lung: Clear to auscultation without any rhonchi, wheezes or dullness to percussion. Abdomin: Soft, nontender without any shifting dullness or ascites. Musculoskeletal: No clubbing or cyanosis. Neurological: No motor or sensory deficits. Skin: No rashes or lesions.           Lab Results: Lab Results  Component Value Date   WBC 6.2 04/13/2019   HGB 9.5 (L) 04/13/2019   HCT 30.2 (L) 04/13/2019   MCV 84.1 04/13/2019   PLT 317 04/13/2019     Chemistry      Component Value Date/Time   NA 138 04/09/2019 2352   NA 141 05/20/2017 1425   K 4.1 04/09/2019 2352   K 4.3 05/20/2017 1425   CL 105 04/09/2019 2352   CL 105 12/08/2012 1504   CO2 25 04/09/2019 2352   CO2 31 (H) 05/20/2017 1425   BUN 24 (H) 04/09/2019 2352   BUN 17.8 05/20/2017 1425   CREATININE 0.87 04/09/2019 2352   CREATININE 0.92 03/24/2019 0818   CREATININE 0.9 05/20/2017 1425      Component Value  Date/Time   CALCIUM 8.7 (L) 04/09/2019 2352   CALCIUM 9.4 05/20/2017 1425   ALKPHOS 75 04/09/2019 2352   ALKPHOS 72 05/20/2017 1425   AST 18 04/09/2019 2352   AST 16 03/24/2019 0818   AST 15 05/20/2017 1425   ALT 8 04/09/2019 2352   ALT 8 03/24/2019 0818   ALT <6 05/20/2017 1425   BILITOT 0.6 04/09/2019 2352   BILITOT 0.3 03/24/2019 0818   BILITOT 0.25 05/20/2017 1425      Results for [name] Mathews, [name] Mathews (MRN [id number]) as of 04/13/2019 13:22  Ref. Range 03/03/2019 08:48 03/24/2019 08:18  Prostate Specific Ag, Serum Latest Ref Range: 0.0 - 4.0 ng/mL 172.0 (H) 110.0 (H)           Impression and Plan:  73 year old man with:    1.  Castration-resistant prostate cancer with disease to the bone and lymphadenopathy diagnosed in 2013.    He has tolerated Taxotere chemotherapy without any major complications at this time.  His PSA continues to show reasonable response to therapy and currently at 50% decrease.  Risks and benefits of continuing this therapy was discussed today includes agreeable to continue.  Long-term complications including neuropathy, myelosuppression and edema were reviewed.  The plan is to tentatively complete 10 cycles.   2. Bone directed therapy: He continues to tolerate Xgeva without complications.  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  His next injection will be in November 2020.   3. Androgen deprivation: Hs next Eligard today and will be in January 2021.  Long-term complications of androgen deprivation was discussed today.  These would include weight gain, hot flashes among others.   5.  IV access: Port-A-Cath continues to be in use without any issues.  6.  Antiemetics: He is experiencing nausea and vomiting that is manageable with Compazine.  We will continue to monitor and adjust premedication and Taxotere dosing if needed.  7.  Growth factor support: He is at risk of neutropenic sepsis and will receive  growth factor support after each treatment..  8.  Followup: In 3 weeks for the next cycle of therapy.   25  minutes was spent with the patient face-to-face today.  More than 50% of time was spent on reviewing laboratory data, disease status update, treatment options and answering questions regarding future plan of care.   Zola Button, MD 10/21/20201:30 PM

## 2019-04-14 ENCOUNTER — Telehealth: Payer: Self-pay

## 2019-04-14 LAB — PROSTATE-SPECIFIC AG, SERUM (LABCORP): Prostate Specific Ag, Serum: 99.4 ng/mL — ABNORMAL HIGH (ref 0.0–4.0)

## 2019-04-14 NOTE — Telephone Encounter (Signed)
Contacted patient and made aware of PSA results. 

## 2019-04-14 NOTE — Telephone Encounter (Signed)
-----   Message from [name], MD sent at 04/14/2019  8:21 AM EDT ----- Please let him know his PSA is down.

## 2019-04-15 ENCOUNTER — Other Ambulatory Visit: Payer: Self-pay

## 2019-04-15 ENCOUNTER — Inpatient Hospital Stay: Payer: Medicare Other

## 2019-04-15 VITALS — BP 101/73 | HR 83 | Temp 98.0°F | Resp 16

## 2019-04-15 DIAGNOSIS — Z5111 Encounter for antineoplastic chemotherapy: Secondary | ICD-10-CM | POA: Diagnosis not present

## 2019-04-15 DIAGNOSIS — C61 Malignant neoplasm of prostate: Secondary | ICD-10-CM

## 2019-04-15 MED ORDER — PEGFILGRASTIM-JMDB 6 MG/0.6ML ~~LOC~~ SOSY
6.0000 mg | PREFILLED_SYRINGE | Freq: Once | SUBCUTANEOUS | Status: AC
  Administered 2019-04-15: 6 mg via SUBCUTANEOUS

## 2019-04-15 MED ORDER — PEGFILGRASTIM-JMDB 6 MG/0.6ML ~~LOC~~ SOSY
PREFILLED_SYRINGE | SUBCUTANEOUS | Status: AC
  Filled 2019-04-15: qty 0.6

## 2019-04-15 NOTE — Patient Instructions (Signed)

## 2019-05-05 ENCOUNTER — Inpatient Hospital Stay: Payer: Medicare Other

## 2019-05-05 ENCOUNTER — Inpatient Hospital Stay: Payer: Medicare Other | Attending: Oncology | Admitting: Oncology

## 2019-05-05 ENCOUNTER — Other Ambulatory Visit: Payer: Self-pay

## 2019-05-05 ENCOUNTER — Telehealth: Payer: Self-pay | Admitting: Oncology

## 2019-05-05 VITALS — BP 116/83 | HR 92 | Temp 98.3°F | Resp 17 | Ht 65.0 in | Wt 158.0 lb

## 2019-05-05 DIAGNOSIS — C61 Malignant neoplasm of prostate: Secondary | ICD-10-CM

## 2019-05-05 DIAGNOSIS — Z5189 Encounter for other specified aftercare: Secondary | ICD-10-CM | POA: Diagnosis not present

## 2019-05-05 DIAGNOSIS — Z5111 Encounter for antineoplastic chemotherapy: Secondary | ICD-10-CM | POA: Insufficient documentation

## 2019-05-05 DIAGNOSIS — Z95828 Presence of other vascular implants and grafts: Secondary | ICD-10-CM

## 2019-05-05 DIAGNOSIS — C7951 Secondary malignant neoplasm of bone: Secondary | ICD-10-CM | POA: Insufficient documentation

## 2019-05-05 LAB — CBC WITH DIFFERENTIAL (CANCER CENTER ONLY)
Abs Immature Granulocytes: 0.02 10*3/uL (ref 0.00–0.07)
Basophils Absolute: 0 10*3/uL (ref 0.0–0.1)
Basophils Relative: 1 %
Eosinophils Absolute: 0 10*3/uL (ref 0.0–0.5)
Eosinophils Relative: 0 %
HCT: 31.7 % — ABNORMAL LOW (ref 39.0–52.0)
Hemoglobin: 9.8 g/dL — ABNORMAL LOW (ref 13.0–17.0)
Immature Granulocytes: 0 %
Lymphocytes Relative: 11 %
Lymphs Abs: 0.7 10*3/uL (ref 0.7–4.0)
MCH: 27 pg (ref 26.0–34.0)
MCHC: 30.9 g/dL (ref 30.0–36.0)
MCV: 87.3 fL (ref 80.0–100.0)
Monocytes Absolute: 0.7 10*3/uL (ref 0.1–1.0)
Monocytes Relative: 11 %
Neutro Abs: 4.8 10*3/uL (ref 1.7–7.7)
Neutrophils Relative %: 77 %
Platelet Count: 297 10*3/uL (ref 150–400)
RBC: 3.63 MIL/uL — ABNORMAL LOW (ref 4.22–5.81)
RDW: 18 % — ABNORMAL HIGH (ref 11.5–15.5)
WBC Count: 6.2 10*3/uL (ref 4.0–10.5)
nRBC: 0 % (ref 0.0–0.2)

## 2019-05-05 LAB — CMP (CANCER CENTER ONLY)
ALT: <6 U/L (ref 0–44)
AST: 18 U/L (ref 15–41)
Albumin: 3 g/dL — ABNORMAL LOW (ref 3.5–5.0)
Alkaline Phosphatase: 79 U/L (ref 38–126)
Anion gap: 11 (ref 5–15)
BUN: 18 mg/dL (ref 8–23)
CO2: 27 mmol/L (ref 22–32)
Calcium: 8.6 mg/dL — ABNORMAL LOW (ref 8.9–10.3)
Chloride: 103 mmol/L (ref 98–111)
Creatinine: 0.9 mg/dL (ref 0.61–1.24)
GFR, Est AFR Am: >60 mL/min (ref >60)
GFR, Estimated: >60 mL/min (ref >60)
Glucose, Bld: 109 mg/dL — ABNORMAL HIGH (ref 70–99)
Potassium: 3.4 mmol/L — ABNORMAL LOW (ref 3.5–5.1)
Sodium: 141 mmol/L (ref 135–145)
Total Bilirubin: 0.3 mg/dL (ref 0.3–1.2)
Total Protein: 6.3 g/dL — ABNORMAL LOW (ref 6.5–8.1)

## 2019-05-05 MED ORDER — SODIUM CHLORIDE 0.9 % IV SOLN
Freq: Once | INTRAVENOUS | Status: AC
  Administered 2019-05-05: 10:00:00 via INTRAVENOUS
  Filled 2019-05-05: qty 250

## 2019-05-05 MED ORDER — DEXAMETHASONE SODIUM PHOSPHATE 10 MG/ML IJ SOLN
INTRAMUSCULAR | Status: AC
  Filled 2019-05-05: qty 1

## 2019-05-05 MED ORDER — DEXAMETHASONE SODIUM PHOSPHATE 10 MG/ML IJ SOLN
10.0000 mg | Freq: Once | INTRAMUSCULAR | Status: AC
  Administered 2019-05-05: 10 mg via INTRAVENOUS

## 2019-05-05 MED ORDER — SODIUM CHLORIDE 0.9% FLUSH
10.0000 mL | Freq: Once | INTRAVENOUS | Status: AC
  Administered 2019-05-05: 10 mL
  Filled 2019-05-05: qty 10

## 2019-05-05 MED ORDER — DENOSUMAB 120 MG/1.7ML ~~LOC~~ SOLN
120.0000 mg | Freq: Once | SUBCUTANEOUS | Status: AC
  Administered 2019-05-05: 11:00:00 120 mg via SUBCUTANEOUS

## 2019-05-05 MED ORDER — SODIUM CHLORIDE 0.9 % IV SOLN
75.0000 mg/m2 | Freq: Once | INTRAVENOUS | Status: AC
  Administered 2019-05-05: 140 mg via INTRAVENOUS
  Filled 2019-05-05: qty 14

## 2019-05-05 MED ORDER — SODIUM CHLORIDE 0.9% FLUSH
10.0000 mL | INTRAVENOUS | Status: DC | PRN
  Administered 2019-05-05: 10 mL
  Filled 2019-05-05: qty 10

## 2019-05-05 MED ORDER — DENOSUMAB 120 MG/1.7ML ~~LOC~~ SOLN
SUBCUTANEOUS | Status: AC
  Filled 2019-05-05: qty 1.7

## 2019-05-05 MED ORDER — HEPARIN SOD (PORK) LOCK FLUSH 100 UNIT/ML IV SOLN
500.0000 [IU] | Freq: Once | INTRAVENOUS | Status: AC | PRN
  Administered 2019-05-05: 500 [IU]
  Filled 2019-05-05: qty 5

## 2019-05-05 NOTE — Telephone Encounter (Signed)
Scheduled appt per 11/12 los.  Pt will get a print out at his next scheduled appt.

## 2019-05-05 NOTE — Patient Instructions (Signed)
[facility] Discharge Instructions for Patients Receiving Chemotherapy  Today you received the following chemotherapy agents: Docetaxel (Taxotere)  To help prevent nausea and vomiting after your treatment, we encourage you to take your nausea medication as directed.   If you develop nausea and vomiting that is not controlled by your nausea medication, call the clinic.   BELOW ARE SYMPTOMS THAT SHOULD BE REPORTED IMMEDIATELY:  *FEVER GREATER THAN 100.5 F  *CHILLS WITH OR WITHOUT FEVER  NAUSEA AND VOMITING THAT IS NOT CONTROLLED WITH YOUR NAUSEA MEDICATION  *UNUSUAL SHORTNESS OF BREATH  *UNUSUAL BRUISING OR BLEEDING  TENDERNESS IN MOUTH AND THROAT WITH OR WITHOUT PRESENCE OF ULCERS  *URINARY PROBLEMS  *BOWEL PROBLEMS  UNUSUAL RASH Items with * indicate a potential emergency and should be followed up as soon as possible.  Feel free to call the clinic should you have any questions or concerns. The clinic phone number is [phone].  Please show the [facility] at check-in to the Emergency Department and triage nurse.  Denosumab injection What is this medicine? DENOSUMAB (den oh sue mab) slows bone breakdown. Prolia is used to treat osteoporosis in women after menopause and in men, and in people who are taking corticosteroids for 6 months or more. Delton See is used to treat a high calcium level due to cancer and to prevent bone fractures and other bone problems caused by multiple myeloma or cancer bone metastases. Delton See is also used to treat giant cell tumor of the bone. This medicine may be used for other purposes; ask your health care provider or pharmacist if you have questions. COMMON BRAND NAME(S): Prolia, XGEVA What should I tell my health care provider before I take this medicine? They need to know if you have any of these conditions:  dental disease  having surgery or tooth extraction  infection  kidney disease  low levels of calcium or Vitamin  D in the blood  malnutrition  on hemodialysis  skin conditions or sensitivity  thyroid or parathyroid disease  an unusual reaction to denosumab, other medicines, foods, dyes, or preservatives  pregnant or trying to get pregnant  breast-feeding How should I use this medicine? This medicine is for injection under the skin. It is given by a health care professional in a hospital or clinic setting. A special MedGuide will be given to you before each treatment. Be sure to read this information carefully each time. For Prolia, talk to your pediatrician regarding the use of this medicine in children. Special care may be needed. For Delton See, talk to your pediatrician regarding the use of this medicine in children. While this drug may be prescribed for children as young as 13 years for selected conditions, precautions do apply. Overdosage: If you think you have taken too much of this medicine contact a poison control center or emergency room at once. NOTE: This medicine is only for you. Do not share this medicine with others. What if I miss a dose? It is important not to miss your dose. Call your doctor or health care professional if you are unable to keep an appointment. What may interact with this medicine? Do not take this medicine with any of the following medications:  other medicines containing denosumab This medicine may also interact with the following medications:  medicines that lower your chance of fighting infection  steroid medicines like prednisone or cortisone This list may not describe all possible interactions. Give your health care provider a list of all the medicines, herbs,  non-prescription drugs, or dietary supplements you use. Also tell them if you smoke, drink alcohol, or use illegal drugs. Some items may interact with your medicine. What should I watch for while using this medicine? Visit your doctor or health care professional for regular checks on your progress. Your  doctor or health care professional may order blood tests and other tests to see how you are doing. Call your doctor or health care professional for advice if you get a fever, chills or sore throat, or other symptoms of a cold or flu. Do not treat yourself. This drug may decrease your body's ability to fight infection. Try to avoid being around people who are sick. You should make sure you get enough calcium and vitamin D while you are taking this medicine, unless your doctor tells you not to. Discuss the foods you eat and the vitamins you take with your health care professional. See your dentist regularly. Brush and floss your teeth as directed. Before you have any dental work done, tell your dentist you are receiving this medicine. Do not become pregnant while taking this medicine or for 5 months after stopping it. Talk with your doctor or health care professional about your birth control options while taking this medicine. Women should inform their doctor if they wish to become pregnant or think they might be pregnant. There is a potential for serious side effects to an unborn child. Talk to your health care professional or pharmacist for more information. What side effects may I notice from receiving this medicine? Side effects that you should report to your doctor or health care professional as soon as possible:  allergic reactions like skin rash, itching or hives, swelling of the face, lips, or tongue  bone pain  breathing problems  dizziness  jaw pain, especially after dental work  redness, blistering, peeling of the skin  signs and symptoms of infection like fever or chills; cough; sore throat; pain or trouble passing urine  signs of low calcium like fast heartbeat, muscle cramps or muscle pain; pain, tingling, numbness in the hands or feet; seizures  unusual bleeding or bruising  unusually weak or tired Side effects that usually do not require medical attention (report to your  doctor or health care professional if they continue or are bothersome):  constipation  diarrhea  headache  joint pain  loss of appetite  muscle pain  runny nose  tiredness  upset stomach This list may not describe all possible side effects. Call your doctor for medical advice about side effects. You may report side effects to FDA at 1-800-FDA-1088. Where should I keep my medicine? This medicine is only given in a clinic, doctor's office, or other health care setting and will not be stored at home. NOTE: This sheet is a summary. It may not cover all possible information. If you have questions about this medicine, talk to your doctor, pharmacist, or health care provider.  2020 Elsevier/Gold Standard (2017-10-16 16:10:44)  Coronavirus (COVID-19) Are you at risk?  Are you at risk for the Coronavirus (COVID-19)?  To be considered HIGH RISK for Coronavirus (COVID-19), you have to meet the following criteria:  . Traveled to [place], [place], [place], [place] or [place]; or in the [place] to [place], [place], [place], or [place]; and have fever, cough, and shortness of breath within the last 2 weeks of travel OR . Been in close contact with a person diagnosed with COVID-19 within the last 2 weeks and have fever, cough,  and shortness of breath . IF YOU DO NOT MEET THESE CRITERIA, YOU ARE CONSIDERED LOW RISK FOR COVID-19.  What to do if you are HIGH RISK for COVID-19?  [name] If you are having a medical emergency, call 911. . Seek medical care right away. Before you go to a doctor's office, urgent care or emergency department, call ahead and tell them about your recent travel, contact with someone diagnosed with COVID-19, and your symptoms. You should receive instructions from your physician's office regarding next steps of care.  . When you arrive at healthcare provider, tell the healthcare staff immediately you have returned from visiting [place], [place], [place], [place] or [place]; or traveled in the [place] to [place], [place], [place], or [place]; in the last two weeks or you have been in close contact with a person diagnosed with COVID-19 in the last 2 weeks.   . Tell the health care staff about your symptoms: fever, cough and shortness of breath. . After you have been seen by a medical provider, you will be either: o Tested for (COVID-19) and discharged home on quarantine except to seek medical care if symptoms worsen, and asked to  - Stay home and avoid contact with others until you get your results (4-5 days)  - Avoid travel on public transportation if possible (such as bus, train, or airplane) or o Sent to the Emergency Department by EMS for evaluation, COVID-19 testing, and possible admission depending on your condition and test results.  What to do if you are LOW RISK for COVID-19?  Reduce your risk of any infection by using the same precautions used for avoiding the common cold or flu:  [name] Wash your hands often with soap and warm water for at least 20 seconds.  If soap and water are not readily available, use an alcohol-based hand sanitizer with at least 60% alcohol.  . If coughing or sneezing, cover your mouth and nose by coughing or sneezing into the elbow areas of your shirt or coat, into a tissue or into your sleeve (not your hands). . Avoid shaking hands with others and consider head nods or verbal greetings only. . Avoid touching your eyes, nose, or mouth with unwashed hands.  . Avoid close contact with people who are sick. . Avoid places or events with large numbers of people in one location, like concerts or sporting events. . Carefully consider travel plans you have or are making. . If you are planning any travel outside or inside the [place], visit the CDC's Travelers' Health webpage for the latest health notices. . If you have some symptoms but not all symptoms, continue to monitor at home and seek medical attention if your symptoms  worsen. . If you are having a medical emergency, call 911.   [place] / e-Visit: [email or web address]         [facility]: [facility]: [phone]                   [facility]: [phone]

## 2019-05-05 NOTE — Progress Notes (Signed)
Hematology and Oncology Follow Up Visit  [name] Mathews [id number] May 28, 1946 73 y.o. 05/05/2019 8:40 AM    Principle Diagnosis: 73 year old man with advanced prostate cancer with disease to the bone diagnosed in 2013.  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   Prior Therapy: 1. He was started on Lupron after attempted prostatectomy in 2001.    2. 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  3. He is status post radiation therapy to the prostate fossa completed in December 2018. He received 52.5 gr 20 fractions.  4. Zytiga 1000 mg po daily with prednisone 5 mg started in 09/2011.   Current therapy:   Xgeva 120 mg started on 10/31/2011.  This will be given every 6 weeks with chemotherapy.  Lupron 30 mg every 4 months.  Next Eligard will be given in January 2021.   Taxotere chemotherapy 75 mg per metered square with cycle 1 started on 01/19/2019.  He is status post 5 cycles of therapy.   Interim History: [name] Mathews is here for a follow-up.  Since the last visit, he reports no major changes in his health.  He continues to tolerate chemotherapy without any complaints.  He does report some mild nausea but no vomiting.  Denies any abdominal pain or diarrhea.  He does report decrease in his taste for certain foods but still able to eat and maintain reasonable weight.  His performance status and quality of life is unchanged.  He denies any worsening neuropathy.   Patient denied any alteration mental status, neuropathy, confusion or dizziness.  Denies any headaches or lethargy.  Denies any night sweats, weight loss or changes in appetite.  Denied orthopnea, dyspnea on exertion or chest discomfort.  Denies shortness of breath, difficulty breathing hemoptysis or cough.  Denies any abdominal distention, nausea, early satiety or dyspepsia.  Denies any hematuria, frequency, dysuria or nocturia.   Denies any skin irritation, dryness or rash.  Denies any ecchymosis or petechiae.  Denies any lymphadenopathy or clotting.  Denies any heat or cold intolerance.  Denies any anxiety or depression.  Remaining review of system is negative.               Medications: Without any change in review. Current Outpatient Medications  Medication Sig Dispense Refill  . albuterol (PROVENTIL HFA;VENTOLIN HFA) 108 (90 Base) MCG/ACT inhaler Inhale 1-2 puffs into the lungs every 6 (six) hours as needed for wheezing or shortness of breath. 1 Inhaler 0  . atorvastatin (LIPITOR) 20 MG tablet Take 1 tablet (20 mg total) by mouth daily. 90 tablet 3  . denosumab (XGEVA) 120 MG/1.7ML SOLN Inject 120 mg into the skin every 30 (thirty) days.    . DULoxetine (CYMBALTA) 60 MG capsule Take 1 capsule (60 mg total) by mouth daily. 90 capsule 2  . EPIPEN 2-PAK 0.3 MG/0.3ML SOAJ injection Inject 0.3 mg into the muscle as needed for anaphylaxis.     . hydrochlorothiazide (HYDRODIURIL) 25 MG tablet Take 1 tablet (25 mg total) by mouth daily. 90 tablet 3  . leuprolide (LUPRON) 30 MG injection Inject 30 mg into the muscle every 4 (four) months.    . lidocaine-prilocaine (EMLA) cream Apply 1 application topically as needed. (Patient taking differently: Apply 1 application topically as needed (port pain). ) 30 g 0  . magnesium chloride (SLOW-MAG) 64 MG TBEC SR tablet Take 1 tablet (64 mg total) by mouth 2 (two)  times daily. 20 tablet 0  . potassium chloride SA (KLOR-CON) 20 MEQ tablet Take 1 tablet (20 mEq total) by mouth 2 (two) times daily. 30 tablet 0  . predniSONE (DELTASONE) 5 MG tablet TAKE 1 TABLET (5 MG TOTAL) BY MOUTH 2 (TWO) TIMES DAILY. (Patient taking differently: Take 5 mg by mouth 2 (two) times daily with a meal. ) 90 tablet 3  . prochlorperazine (COMPAZINE) 10 MG tablet TAKE 1 TABLET BY MOUTH EVERY 6 HOURS AS NEEDED FOR NAUSEA FOR VOMITING (Patient taking differently: Take 10 mg by mouth every 6 (six) hours as  needed for nausea. ) 30 tablet 0  . rOPINIRole (REQUIP) 2 MG tablet Take 1 tablet (2 mg total) by mouth at bedtime. 90 tablet 2   No current facility-administered medications for this visit.      Allergies:  Allergies  Allergen Reactions  . Bee Venom Anaphylaxis, Shortness Of Breath and Swelling    Tongue swelling.   . Wasp Venom Anaphylaxis, Shortness Of Breath and Swelling    Tongue swelling   . Percocet [Oxycodone-Acetaminophen] Palpitations    His past medical history, social history and family history remains without changes.    Physical Exam:   Blood pressure 116/83, pulse 92, temperature 98.3 F (36.8 C), temperature source Temporal, resp. rate 17, height 5\' 5"  (1.651 m), weight 158 lb (71.7 kg), SpO2 100 %.     ECOG: 1     General appearance: Comfortable appearing without any discomfort Head: Normocephalic without any trauma Oropharynx: Mucous membranes are moist and pink without any thrush or ulcers. Eyes: Pupils are equal and round reactive to light. Lymph nodes: No cervical, supraclavicular, inguinal or axillary lymphadenopathy.   Heart:regular rate and rhythm.  S1 and S2 without leg edema. Lung: Clear without any rhonchi or wheezes.  No dullness to percussion. Abdomin: Soft, nontender, nondistended with good bowel sounds.  No hepatosplenomegaly. Musculoskeletal: No joint deformity or effusion.  Full range of motion noted. Neurological: No deficits noted on motor, sensory and deep tendon reflex exam. Skin: No petechial rash or dryness.  Appeared moist.             Lab Results: Lab Results  Component Value Date   WBC 6.2 04/13/2019   HGB 9.5 (L) 04/13/2019   HCT 30.2 (L) 04/13/2019   MCV 84.1 04/13/2019   PLT 317 04/13/2019     Chemistry      Component Value Date/Time   NA 140 04/13/2019 1255   NA 141 05/20/2017 1425   K 3.8 04/13/2019 1255   K 4.3 05/20/2017 1425   CL 105 04/13/2019 1255   CL 105 12/08/2012 1504   CO2 26 04/13/2019  1255   CO2 31 (H) 05/20/2017 1425   BUN 21 04/13/2019 1255   BUN 17.8 05/20/2017 1425   CREATININE 0.93 04/13/2019 1255   CREATININE 0.9 05/20/2017 1425      Component Value Date/Time   CALCIUM 8.4 (L) 04/13/2019 1255   CALCIUM 9.4 05/20/2017 1425   ALKPHOS 75 04/13/2019 1255   ALKPHOS 72 05/20/2017 1425   AST 15 04/13/2019 1255   AST 15 05/20/2017 1425   ALT <6 04/13/2019 1255   ALT <6 05/20/2017 1425   BILITOT 0.3 04/13/2019 1255   BILITOT 0.25 05/20/2017 1425       Results for [name] Mathews, [name] Mathews (MRN [id number]) as of 05/05/2019 08:42  Ref. Range 08/31/2018 13:08 11/02/2018 09:38 12/30/2018 11:35 01/19/2019 08:57 02/10/2019 08:55 03/03/2019 08:48 03/24/2019 08:18 04/13/2019 12:55  Prostate Specific  Ag, Serum Latest Ref Range: 0.0 - 4.0 ng/mL 52.5 (H) 74.9 (H) 100.0 (H) 102.0 (H) 225.0 (H) 172.0 (H) 110.0 (H) 99.4 (H)           Impression and Plan:   73 year old man with:    1.  Advanced prostate cancer with lymphadenopathy and bone disease that is currently castration-resistant since 2013.[name]    He continues to receive Taxotere chemotherapy with reasonable PSA response.  He had more than 50% decline in his PSA currently at 99.4 after decline from 225.  Risks and benefits of continuing 10 cycles of therapy at least was discussed.  Potential complication include nausea, vomiting, myelosuppression and worsening his appetite.  He is agreeable to proceed at this time for tentative 10 cycle scheduled.   2. Bone directed therapy: I recommended continuing Xgeva every 6 weeks.  Potential complications including osteonecrosis of the jaw and hypocalcemia were reiterated.  He is agreeable to continue pending the results of his calcium.   3. Androgen deprivation: Risks and benefits of continuing long-term androgen deprivation was reviewed.  Complications including weight gain, hot flashes among others were reviewed.  He is agreeable to proceeding next scheduled injection will be in January  2021.   5.  IV access: Port-A-Cath remains in use without any complications.  6.  Antiemetics: Compazine is available to him and he has been using it regularly.  7.  Growth factor support: We will continue to use growth factor support after each cycle given his risk of neutropenia and sepsis.  8.  Followup: He will follow up in 3 weeks for the next cycle of therapy.   25  minutes was spent with the patient face-to-face today.  More than 50% of time was dedicated to discussing the natural course of his disease, complications letter to therapy and addressing treatment for these complications.   Zola Button, MD 11/12/20208:40 AM

## 2019-05-06 LAB — PROSTATE-SPECIFIC AG, SERUM (LABCORP): Prostate Specific Ag, Serum: 108 ng/mL — ABNORMAL HIGH (ref 0.0–4.0)

## 2019-05-07 ENCOUNTER — Inpatient Hospital Stay: Payer: Medicare Other

## 2019-05-07 VITALS — BP 144/82 | HR 84 | Temp 99.2°F | Resp 18

## 2019-05-07 DIAGNOSIS — C61 Malignant neoplasm of prostate: Secondary | ICD-10-CM

## 2019-05-07 DIAGNOSIS — Z5111 Encounter for antineoplastic chemotherapy: Secondary | ICD-10-CM | POA: Diagnosis not present

## 2019-05-07 MED ORDER — PEGFILGRASTIM-JMDB 6 MG/0.6ML ~~LOC~~ SOSY
6.0000 mg | PREFILLED_SYRINGE | Freq: Once | SUBCUTANEOUS | Status: AC
  Administered 2019-05-07: 6 mg via SUBCUTANEOUS

## 2019-05-07 NOTE — Patient Instructions (Signed)

## 2019-05-25 ENCOUNTER — Inpatient Hospital Stay: Payer: Medicare Other

## 2019-05-25 ENCOUNTER — Other Ambulatory Visit: Payer: Self-pay

## 2019-05-25 ENCOUNTER — Inpatient Hospital Stay (HOSPITAL_BASED_OUTPATIENT_CLINIC_OR_DEPARTMENT_OTHER): Payer: Medicare Other | Admitting: Oncology

## 2019-05-25 ENCOUNTER — Inpatient Hospital Stay: Payer: Medicare Other | Attending: Oncology

## 2019-05-25 VITALS — HR 76

## 2019-05-25 VITALS — BP 144/81 | HR 106 | Temp 98.7°F | Resp 18 | Ht 65.0 in | Wt 159.2 lb

## 2019-05-25 DIAGNOSIS — C7951 Secondary malignant neoplasm of bone: Secondary | ICD-10-CM | POA: Diagnosis present

## 2019-05-25 DIAGNOSIS — Z5111 Encounter for antineoplastic chemotherapy: Secondary | ICD-10-CM | POA: Diagnosis not present

## 2019-05-25 DIAGNOSIS — C61 Malignant neoplasm of prostate: Secondary | ICD-10-CM | POA: Diagnosis not present

## 2019-05-25 DIAGNOSIS — Z5189 Encounter for other specified aftercare: Secondary | ICD-10-CM | POA: Insufficient documentation

## 2019-05-25 DIAGNOSIS — Z95828 Presence of other vascular implants and grafts: Secondary | ICD-10-CM

## 2019-05-25 LAB — CMP (CANCER CENTER ONLY)
ALT: 7 U/L (ref 0–44)
AST: 18 U/L (ref 15–41)
Albumin: 3 g/dL — ABNORMAL LOW (ref 3.5–5.0)
Alkaline Phosphatase: 79 U/L (ref 38–126)
Anion gap: 9 (ref 5–15)
BUN: 14 mg/dL (ref 8–23)
CO2: 27 mmol/L (ref 22–32)
Calcium: 8.5 mg/dL — ABNORMAL LOW (ref 8.9–10.3)
Chloride: 107 mmol/L (ref 98–111)
Creatinine: 0.94 mg/dL (ref 0.61–1.24)
GFR, Est AFR Am: >60 mL/min (ref >60)
GFR, Estimated: >60 mL/min (ref >60)
Glucose, Bld: 113 mg/dL — ABNORMAL HIGH (ref 70–99)
Potassium: 4 mmol/L (ref 3.5–5.1)
Sodium: 143 mmol/L (ref 135–145)
Total Bilirubin: 0.3 mg/dL (ref 0.3–1.2)
Total Protein: 6.1 g/dL — ABNORMAL LOW (ref 6.5–8.1)

## 2019-05-25 LAB — CBC WITH DIFFERENTIAL (CANCER CENTER ONLY)
Abs Immature Granulocytes: 0.02 10*3/uL (ref 0.00–0.07)
Basophils Absolute: 0 10*3/uL (ref 0.0–0.1)
Basophils Relative: 0 %
Eosinophils Absolute: 0 10*3/uL (ref 0.0–0.5)
Eosinophils Relative: 0 %
HCT: 34 % — ABNORMAL LOW (ref 39.0–52.0)
Hemoglobin: 10.6 g/dL — ABNORMAL LOW (ref 13.0–17.0)
Immature Granulocytes: 0 %
Lymphocytes Relative: 12 %
Lymphs Abs: 0.9 10*3/uL (ref 0.7–4.0)
MCH: 28 pg (ref 26.0–34.0)
MCHC: 31.2 g/dL (ref 30.0–36.0)
MCV: 89.7 fL (ref 80.0–100.0)
Monocytes Absolute: 0.7 10*3/uL (ref 0.1–1.0)
Monocytes Relative: 9 %
Neutro Abs: 5.4 10*3/uL (ref 1.7–7.7)
Neutrophils Relative %: 79 %
Platelet Count: 337 10*3/uL (ref 150–400)
RBC: 3.79 MIL/uL — ABNORMAL LOW (ref 4.22–5.81)
RDW: 17.2 % — ABNORMAL HIGH (ref 11.5–15.5)
WBC Count: 7 10*3/uL (ref 4.0–10.5)
nRBC: 0 % (ref 0.0–0.2)

## 2019-05-25 MED ORDER — SODIUM CHLORIDE 0.9 % IV SOLN
Freq: Once | INTRAVENOUS | Status: AC
  Administered 2019-05-25: 14:00:00 via INTRAVENOUS
  Filled 2019-05-25: qty 250

## 2019-05-25 MED ORDER — SODIUM CHLORIDE 0.9% FLUSH
10.0000 mL | INTRAVENOUS | Status: DC | PRN
  Administered 2019-05-25: 10 mL
  Filled 2019-05-25: qty 10

## 2019-05-25 MED ORDER — DEXAMETHASONE SODIUM PHOSPHATE 10 MG/ML IJ SOLN
INTRAMUSCULAR | Status: AC
  Filled 2019-05-25: qty 1

## 2019-05-25 MED ORDER — HEPARIN SOD (PORK) LOCK FLUSH 100 UNIT/ML IV SOLN
500.0000 [IU] | Freq: Once | INTRAVENOUS | Status: AC | PRN
  Administered 2019-05-25: 500 [IU]
  Filled 2019-05-25: qty 5

## 2019-05-25 MED ORDER — SODIUM CHLORIDE 0.9 % IV SOLN
75.0000 mg/m2 | Freq: Once | INTRAVENOUS | Status: AC
  Administered 2019-05-25: 15:00:00 140 mg via INTRAVENOUS
  Filled 2019-05-25: qty 14

## 2019-05-25 MED ORDER — SODIUM CHLORIDE 0.9% FLUSH
10.0000 mL | Freq: Once | INTRAVENOUS | Status: AC
  Administered 2019-05-25: 10 mL
  Filled 2019-05-25: qty 10

## 2019-05-25 MED ORDER — DEXAMETHASONE SODIUM PHOSPHATE 10 MG/ML IJ SOLN
10.0000 mg | Freq: Once | INTRAMUSCULAR | Status: AC
  Administered 2019-05-25: 10 mg via INTRAVENOUS

## 2019-05-25 NOTE — Progress Notes (Signed)
Hematology and Oncology Follow Up Visit  [name] Mathews [id number] [birth year] 73 y.o. 05/25/2019 12:50 PM    Principle Diagnosis: 73 year old man with castration-resistant prostate cancer with disease to the bone diagnosed in 2013.  He was initially diagnosed in 2001 with advanced disease and Gleason score of 4+5 = 9.    Prior Therapy: 1. He was started on Lupron after attempted prostatectomy in 2001.    2. 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  3. He is status post radiation therapy to the prostate fossa completed in December 2018. He received 52.5 gr 20 fractions.  4. Zytiga 1000 mg po daily with prednisone 5 mg started in 09/2011.   Current therapy:   Xgeva 120 mg started on 10/31/2011.  This will be given every 6 weeks with chemotherapy.  Lupron 30 mg every 4 months.  Next Eligard will be given in January 2021.   Taxotere chemotherapy 75 mg per metered square with cycle 1 started on 01/19/2019. He is here for cycle 7 of therapy.   Interim History: [name] Mathews returns today for a repeat evaluation.  Since the last visit, he reports no major changes in his health.  He has had tolerated the last cycle of chemotherapy without any major complaints.  He does report some mild nausea but manageable at this time with the current antiemetics.  He denies any vomiting or abdominal pain.  He is reporting some mild fatigue tiredness and occasional dyspnea on exertion.  He still able to function and performs all activities of daily living.   He denied headaches, blurry vision, syncope or seizures.  Denies any fevers, chills or sweats.  Denied chest pain, palpitation, orthopnea or leg edema.  Denied cough, wheezing or hemoptysis.  Denied nausea, vomiting or abdominal pain.  Denies any constipation or diarrhea.  Denies any frequency urgency or hesitancy.  Denies any arthralgias or myalgias.  Denies any skin rashes or lesions.  Denies any  bleeding or clotting tendency.  Denies any easy bruising.  Denies any hair or nail changes.  Denies any anxiety or depression.  Remaining review of system is negative.      Medications: Without any changes on review. Current Outpatient Medications  Medication Sig Dispense Refill  . albuterol (PROVENTIL HFA;VENTOLIN HFA) 108 (90 Base) MCG/ACT inhaler Inhale 1-2 puffs into the lungs every 6 (six) hours as needed for wheezing or shortness of breath. 1 Inhaler 0  . atorvastatin (LIPITOR) 20 MG tablet Take 1 tablet (20 mg total) by mouth daily. 90 tablet 3  . denosumab (XGEVA) 120 MG/1.7ML SOLN Inject 120 mg into the skin every 30 (thirty) days.    . DULoxetine (CYMBALTA) 60 MG capsule Take 1 capsule (60 mg total) by mouth daily. 90 capsule 2  . EPIPEN 2-PAK 0.3 MG/0.3ML SOAJ injection Inject 0.3 mg into the muscle as needed for anaphylaxis.     . hydrochlorothiazide (HYDRODIURIL) 25 MG tablet Take 1 tablet (25 mg total) by mouth daily. 90 tablet 3  . leuprolide (LUPRON) 30 MG injection Inject 30 mg into the muscle every 4 (four) months.    . lidocaine-prilocaine (EMLA) cream Apply 1 application topically as needed. (Patient taking differently: Apply 1 application topically as needed (port pain). ) 30 g 0  . magnesium chloride (SLOW-MAG) 64 MG TBEC SR tablet Take 1 tablet (64 mg total) by mouth 2 (two) times daily. 20 tablet 0  . potassium chloride SA (KLOR-CON) 20 MEQ  tablet Take 1 tablet (20 mEq total) by mouth 2 (two) times daily. 30 tablet 0  . predniSONE (DELTASONE) 5 MG tablet TAKE 1 TABLET (5 MG TOTAL) BY MOUTH 2 (TWO) TIMES DAILY. (Patient taking differently: Take 5 mg by mouth 2 (two) times daily with a meal. ) 90 tablet 3  . prochlorperazine (COMPAZINE) 10 MG tablet TAKE 1 TABLET BY MOUTH EVERY 6 HOURS AS NEEDED FOR NAUSEA FOR VOMITING (Patient taking differently: Take 10 mg by mouth every 6 (six) hours as needed for nausea. ) 30 tablet 0  . rOPINIRole (REQUIP) 2 MG tablet Take 1 tablet (2  mg total) by mouth at bedtime. 90 tablet 2   No current facility-administered medications for this visit.      Allergies:  Allergies  Allergen Reactions  . Bee Venom Anaphylaxis, Shortness Of Breath and Swelling    Tongue swelling.   . Wasp Venom Anaphylaxis, Shortness Of Breath and Swelling    Tongue swelling   . Percocet [Oxycodone-Acetaminophen] Palpitations    His past medical history, social history and family history updated without any changes.    Physical Exam:  Blood pressure (!) 144/81, pulse (!) 106, temperature 98.7 F (37.1 C), temperature source Temporal, resp. rate 18, height 5\' 5"  (1.651 m), weight 159 lb 3.2 oz (72.2 kg), SpO2 97 %.       ECOG: 1      General appearance: Alert, awake without any distress. Head: Atraumatic without abnormalities Oropharynx: Without any thrush or ulcers. Eyes: No scleral icterus. Lymph nodes: No lymphadenopathy noted in the cervical, supraclavicular, or axillary nodes Heart:regular rate and rhythm, without any murmurs or gallops.   Lung: Clear to auscultation without any rhonchi, wheezes or dullness to percussion. Abdomin: Soft, nontender without any shifting dullness or ascites. Musculoskeletal: No clubbing or cyanosis. Neurological: No motor or sensory deficits. Skin: No rashes or lesions. Psychiatric: Mood and affect appeared normal.             Lab Results: Lab Results  Component Value Date   WBC 6.2 05/05/2019   HGB 9.8 (L) 05/05/2019   HCT 31.7 (L) 05/05/2019   MCV 87.3 05/05/2019   PLT 297 05/05/2019     Chemistry      Component Value Date/Time   NA 141 05/05/2019 0818   NA 141 05/20/2017 1425   K 3.4 (L) 05/05/2019 0818   K 4.3 05/20/2017 1425   CL 103 05/05/2019 0818   CL 105 12/08/2012 1504   CO2 27 05/05/2019 0818   CO2 31 (H) 05/20/2017 1425   BUN 18 05/05/2019 0818   BUN 17.8 05/20/2017 1425   CREATININE 0.90 05/05/2019 0818   CREATININE 0.9 05/20/2017 1425      Component  Value Date/Time   CALCIUM 8.6 (L) 05/05/2019 0818   CALCIUM 9.4 05/20/2017 1425   ALKPHOS 79 05/05/2019 0818   ALKPHOS 72 05/20/2017 1425   AST 18 05/05/2019 0818   AST 15 05/20/2017 1425   ALT <6 05/05/2019 0818   ALT <6 05/20/2017 1425   BILITOT 0.3 05/05/2019 0818   BILITOT 0.25 05/20/2017 1425         Results for BRYLAND, RULLAN (MRN [id number]) as of 05/25/2019 12:52  Ref. Range 04/13/2019 12:55 05/05/2019 08:18  Prostate Specific Ag, Serum Latest Ref Range: 0.0 - 4.0 ng/mL 99.4 (H) 108.0 (H)          Impression and Plan:   73 year old man with:    1.  Castration-resistant prostate cancer diagnosed  in 2013.     He has tolerated Taxotere chemotherapy without any major complications.  Risks and benefits of continuing this therapy was discussed.  Potential long-term complications related to neuropathy, weight loss as well as fluid retention was reviewed.  Alternative options were also reviewed including PARP inhibitor if he harbors the appropriate mutation.  For the time being he is agreeable to continue with chemotherapy to complete at least 10 cycles of therapy.   2. Bone directed therapy: He will continue to receive Xgeva every 6 weeks.  Long-term complication clinic hypocalcemia and osteonecrosis of the jaw were reviewed.  He is agreeable to continue.  His next injection will be given in 3 weeks from now.   3. Androgen deprivation: Next Eligard injection will be in January 2021.  Last injection given in September without any major complications.  Long-term issues associated with androgen deprivation occluding weight gain, hot flashes among others.   5.  IV access: Port-A-Cath currently in use without any issues.  6.  Antiemetics: Manageable with the current antiemetics at this time.  7.  Growth factor support: He is at risk of developing neutropenic sepsis and recommended continuing growth factor support after each cycle.  8.  Followup: In 3 weeks for evaluation  for the next cycle of therapy.   25  minutes was spent with the patient face-to-face today.  More than 50% of time was spent on updating his disease status, treatment options as well as dealing with complications related to his current therapy.   Zola Button, MD 12/2/202012:50 PM

## 2019-05-25 NOTE — Patient Instructions (Signed)
[facility] Discharge Instructions for Patients Receiving Chemotherapy  Today you received the following chemotherapy agents: Docetaxel (Taxotere)  To help prevent nausea and vomiting after your treatment, we encourage you to take your nausea medication as directed.   If you develop nausea and vomiting that is not controlled by your nausea medication, call the clinic.   BELOW ARE SYMPTOMS THAT SHOULD BE REPORTED IMMEDIATELY:  *FEVER GREATER THAN 100.5 F  *CHILLS WITH OR WITHOUT FEVER  NAUSEA AND VOMITING THAT IS NOT CONTROLLED WITH YOUR NAUSEA MEDICATION  *UNUSUAL SHORTNESS OF BREATH  *UNUSUAL BRUISING OR BLEEDING  TENDERNESS IN MOUTH AND THROAT WITH OR WITHOUT PRESENCE OF ULCERS  *URINARY PROBLEMS  *BOWEL PROBLEMS  UNUSUAL RASH Items with * indicate a potential emergency and should be followed up as soon as possible.  Feel free to call the clinic should you have any questions or concerns. The clinic phone number is [phone].  Please show the CHEMO ALERT CARD at check-in to the Emergency Department and triage nurse.  Denosumab injection What is this medicine? DENOSUMAB (den oh sue mab) slows bone breakdown. Prolia is used to treat osteoporosis in women after menopause and in men, and in people who are taking corticosteroids for 6 months or more. Xgeva is used to treat a high calcium level due to cancer and to prevent bone fractures and other bone problems caused by multiple myeloma or cancer bone metastases. Xgeva is also used to treat giant cell tumor of the bone. This medicine may be used for other purposes; ask your health care provider or pharmacist if you have questions. COMMON BRAND NAME(S): Prolia, XGEVA What should I tell my health care provider before I take this medicine? They need to know if you have any of these conditions:  dental disease  having surgery or tooth extraction  infection  kidney disease  low levels of calcium or Vitamin  D in the blood  malnutrition  on hemodialysis  skin conditions or sensitivity  thyroid or parathyroid disease  an unusual reaction to denosumab, other medicines, foods, dyes, or preservatives  pregnant or trying to get pregnant  breast-feeding How should I use this medicine? This medicine is for injection under the skin. It is given by a health care professional in a hospital or clinic setting. A special MedGuide will be given to you before each treatment. Be sure to read this information carefully each time. For Prolia, talk to your pediatrician regarding the use of this medicine in children. Special care may be needed. For Xgeva, talk to your pediatrician regarding the use of this medicine in children. While this drug may be prescribed for children as young as 13 years for selected conditions, precautions do apply. Overdosage: If you think you have taken too much of this medicine contact a poison control center or emergency room at once. NOTE: This medicine is only for you. Do not share this medicine with others. What if I miss a dose? It is important not to miss your dose. Call your doctor or health care professional if you are unable to keep an appointment. What may interact with this medicine? Do not take this medicine with any of the following medications:  other medicines containing denosumab This medicine may also interact with the following medications:  medicines that lower your chance of fighting infection  steroid medicines like prednisone or cortisone This list may not describe all possible interactions. Give your health care provider a list of all the medicines, herbs,   non-prescription drugs, or dietary supplements you use. Also tell them if you smoke, drink alcohol, or use illegal drugs. Some items may interact with your medicine. What should I watch for while using this medicine? Visit your doctor or health care professional for regular checks on your progress. Your  doctor or health care professional may order blood tests and other tests to see how you are doing. Call your doctor or health care professional for advice if you get a fever, chills or sore throat, or other symptoms of a cold or flu. Do not treat yourself. This drug may decrease your body's ability to fight infection. Try to avoid being around people who are sick. You should make sure you get enough calcium and vitamin D while you are taking this medicine, unless your doctor tells you not to. Discuss the foods you eat and the vitamins you take with your health care professional. See your dentist regularly. Brush and floss your teeth as directed. Before you have any dental work done, tell your dentist you are receiving this medicine. Do not become pregnant while taking this medicine or for 5 months after stopping it. Talk with your doctor or health care professional about your birth control options while taking this medicine. Women should inform their doctor if they wish to become pregnant or think they might be pregnant. There is a potential for serious side effects to an unborn child. Talk to your health care professional or pharmacist for more information. What side effects may I notice from receiving this medicine? Side effects that you should report to your doctor or health care professional as soon as possible:  allergic reactions like skin rash, itching or hives, swelling of the face, lips, or tongue  bone pain  breathing problems  dizziness  jaw pain, especially after dental work  redness, blistering, peeling of the skin  signs and symptoms of infection like fever or chills; cough; sore throat; pain or trouble passing urine  signs of low calcium like fast heartbeat, muscle cramps or muscle pain; pain, tingling, numbness in the hands or feet; seizures  unusual bleeding or bruising  unusually weak or tired Side effects that usually do not require medical attention (report to your  doctor or health care professional if they continue or are bothersome):  constipation  diarrhea  headache  joint pain  loss of appetite  muscle pain  runny nose  tiredness  upset stomach This list may not describe all possible side effects. Call your doctor for medical advice about side effects. You may report side effects to FDA at 1-800-FDA-1088. Where should I keep my medicine? This medicine is only given in a clinic, doctor's office, or other health care setting and will not be stored at home. NOTE: This sheet is a summary. It may not cover all possible information. If you have questions about this medicine, talk to your doctor, pharmacist, or health care provider.  2020 Elsevier/Gold Standard (2017-10-16 16:10:44)  Coronavirus (COVID-19) Are you at risk?  Are you at risk for the Coronavirus (COVID-19)?  To be considered HIGH RISK for Coronavirus (COVID-19), you have to meet the following criteria:  . Traveled to [place], [place], [place], [place] or [place]; or in the [place] to [place], [place], [place], or [place]; and have fever, cough, and shortness of breath within the last 2 weeks of travel OR . Been in close contact with a person diagnosed with COVID-19 within the last 2 weeks and have fever, cough,   and shortness of breath . IF YOU DO NOT MEET THESE CRITERIA, YOU ARE CONSIDERED LOW RISK FOR COVID-19.  What to do if you are HIGH RISK for COVID-19?  . If you are having a medical emergency, call 911. . Seek medical care right away. Before you go to a doctor's office, urgent care or emergency department, call ahead and tell them about your recent travel, contact with someone diagnosed with COVID-19, and your symptoms. You should receive instructions from your physician's office regarding next steps of care.  . When you arrive at healthcare provider, tell the healthcare staff immediately you have returned from visiting [place], [place], [place], [place] or [place]; or traveled in the [place] to [place], [place], [place], or [place]; in the last two weeks or you have been in close contact with a person diagnosed with COVID-19 in the last 2 weeks.   . Tell the health care staff about your symptoms: fever, cough and shortness of breath. . After you have been seen by a medical provider, you will be either: o Tested for (COVID-19) and discharged home on quarantine except to seek medical care if symptoms worsen, and asked to  - Stay home and avoid contact with others until you get your results (4-5 days)  - Avoid travel on public transportation if possible (such as bus, train, or airplane) or o Sent to the Emergency Department by EMS for evaluation, COVID-19 testing, and possible admission depending on your condition and test results.  What to do if you are LOW RISK for COVID-19?  Reduce your risk of any infection by using the same precautions used for avoiding the common cold or flu:  . Wash your hands often with soap and warm water for at least 20 seconds.  If soap and water are not readily available, use an alcohol-based hand sanitizer with at least 60% alcohol.  . If coughing or sneezing, cover your mouth and nose by coughing or sneezing into the elbow areas of your shirt or coat, into a tissue or into your sleeve (not your hands). . Avoid shaking hands with others and consider head nods or verbal greetings only. . Avoid touching your eyes, nose, or mouth with unwashed hands.  . Avoid close contact with people who are sick. . Avoid places or events with large numbers of people in one location, like concerts or sporting events. . Carefully consider travel plans you have or are making. . If you are planning any travel outside or inside the [place], visit the CDC's Travelers' Health webpage for the latest health notices. . If you have some symptoms but not all symptoms, continue to monitor at home and seek medical attention if your symptoms  worsen. . If you are having a medical emergency, call 911.   ADDITIONAL HEALTHCARE OPTIONS FOR PATIENTS  [facility] / e-Visit: [email or web address]         [facility]: [phone]  [facility]: [phone]                   [facility]: [phone]   

## 2019-05-26 ENCOUNTER — Telehealth: Payer: Self-pay | Admitting: Oncology

## 2019-05-26 ENCOUNTER — Telehealth: Payer: Self-pay

## 2019-05-26 LAB — PROSTATE-SPECIFIC AG, SERUM (LABCORP): Prostate Specific Ag, Serum: 63.5 ng/mL — ABNORMAL HIGH (ref 0.0–4.0)

## 2019-05-26 NOTE — Telephone Encounter (Signed)
-----   Message from [name] N [name], MD sent at 05/26/2019  8:14 AM EST ----- Please let him know his PSA is down 

## 2019-05-26 NOTE — Telephone Encounter (Signed)
Scheduled appt per 12/2 los. ° ° °

## 2019-05-26 NOTE — Telephone Encounter (Signed)
Called patient and let him know PSA results. Patient verbalized understanding.

## 2019-05-27 ENCOUNTER — Other Ambulatory Visit: Payer: Self-pay

## 2019-05-27 ENCOUNTER — Inpatient Hospital Stay: Payer: Medicare Other

## 2019-05-27 ENCOUNTER — Inpatient Hospital Stay: Payer: Medicare Other | Admitting: Medical

## 2019-05-27 VITALS — BP 91/70 | HR 84 | Temp 97.8°F | Resp 20

## 2019-05-27 DIAGNOSIS — C61 Malignant neoplasm of prostate: Secondary | ICD-10-CM

## 2019-05-27 DIAGNOSIS — Z5111 Encounter for antineoplastic chemotherapy: Secondary | ICD-10-CM | POA: Diagnosis not present

## 2019-05-27 MED ORDER — PEGFILGRASTIM-JMDB 6 MG/0.6ML ~~LOC~~ SOSY
PREFILLED_SYRINGE | SUBCUTANEOUS | Status: AC
  Filled 2019-05-27: qty 0.6

## 2019-05-27 MED ORDER — PEGFILGRASTIM-JMDB 6 MG/0.6ML ~~LOC~~ SOSY
6.0000 mg | PREFILLED_SYRINGE | Freq: Once | SUBCUTANEOUS | Status: AC
  Administered 2019-05-27: 12:00:00 6 mg via SUBCUTANEOUS

## 2019-05-27 NOTE — Progress Notes (Signed)
As patient cam back for injection he began to feel dizzy, BP was 89/68. Let him sit for a few minutes and retook vitals. 100/71 had Sandi Mealy, PA come in and check on patient he advised to proceed with injection but for the patient to eat and he would recheck him in symptom management. Patient didn't want me to call family at this time.

## 2019-05-27 NOTE — Patient Instructions (Signed)

## 2019-05-27 NOTE — Progress Notes (Signed)
[name] Mathews is a 73 year old male with a history of recurrent prostate adenocarcinoma who is managed by Dr. Alen Blew.  He is status post cycle 7, day 1 of Taxotere which was dosed on 05/25/2019.  He presented to the flush room today for a  Fulphila shot.  He was noted to be hypotensive and dizzy.  He reports that he has not eaten anything thus far today he had 1 small bottle of water to drink but otherwise has not had any p.o. intake.  He was given orange juice and brought over to the Symptom Management Clinic where he was given a sandwich, chips, and chicken noodle soup along with a ginger ale to drink.  He ate this entire meal and was beginning to feel better when he suddenly had a hot flash and vomited.  After he vomited he reported that he felt significantly better.  His vital signs were retaken with his blood pressure returned at 97/64 with a pulse of 86.  His son was contacted to come and pick him up.  He was offered antiemetics but stated that he would take one of his pills when he got home.  He was escorted to the front of the clinic where he awaited his ride.  Sandi Mealy, MHS, PA-C Physician Assistant

## 2019-06-10 ENCOUNTER — Other Ambulatory Visit: Payer: Self-pay | Admitting: Oncology

## 2019-06-14 ENCOUNTER — Inpatient Hospital Stay: Payer: Medicare Other

## 2019-06-14 ENCOUNTER — Inpatient Hospital Stay (HOSPITAL_BASED_OUTPATIENT_CLINIC_OR_DEPARTMENT_OTHER): Payer: Medicare Other | Admitting: Oncology

## 2019-06-14 ENCOUNTER — Other Ambulatory Visit: Payer: Self-pay

## 2019-06-14 VITALS — BP 139/87 | HR 82 | Temp 98.0°F | Resp 18 | Ht 65.0 in | Wt 163.4 lb

## 2019-06-14 DIAGNOSIS — C61 Malignant neoplasm of prostate: Secondary | ICD-10-CM

## 2019-06-14 DIAGNOSIS — Z5111 Encounter for antineoplastic chemotherapy: Secondary | ICD-10-CM | POA: Diagnosis not present

## 2019-06-14 DIAGNOSIS — Z95828 Presence of other vascular implants and grafts: Secondary | ICD-10-CM

## 2019-06-14 LAB — CMP (CANCER CENTER ONLY)
ALT: 7 U/L (ref 0–44)
AST: 19 U/L (ref 15–41)
Albumin: 3 g/dL — ABNORMAL LOW (ref 3.5–5.0)
Alkaline Phosphatase: 75 U/L (ref 38–126)
Anion gap: 12 (ref 5–15)
BUN: 22 mg/dL (ref 8–23)
CO2: 23 mmol/L (ref 22–32)
Calcium: 9 mg/dL (ref 8.9–10.3)
Chloride: 108 mmol/L (ref 98–111)
Creatinine: 0.84 mg/dL (ref 0.61–1.24)
GFR, Est AFR Am: >60 mL/min (ref >60)
GFR, Estimated: >60 mL/min (ref >60)
Glucose, Bld: 111 mg/dL — ABNORMAL HIGH (ref 70–99)
Potassium: 4.3 mmol/L (ref 3.5–5.1)
Sodium: 143 mmol/L (ref 135–145)
Total Bilirubin: <0.2 mg/dL — ABNORMAL LOW (ref 0.3–1.2)
Total Protein: 6.1 g/dL — ABNORMAL LOW (ref 6.5–8.1)

## 2019-06-14 LAB — CBC WITH DIFFERENTIAL (CANCER CENTER ONLY)
Abs Immature Granulocytes: 0.02 10*3/uL (ref 0.00–0.07)
Basophils Absolute: 0 10*3/uL (ref 0.0–0.1)
Basophils Relative: 0 %
Eosinophils Absolute: 0 10*3/uL (ref 0.0–0.5)
Eosinophils Relative: 0 %
HCT: 33.6 % — ABNORMAL LOW (ref 39.0–52.0)
Hemoglobin: 10.4 g/dL — ABNORMAL LOW (ref 13.0–17.0)
Immature Granulocytes: 0 %
Lymphocytes Relative: 16 %
Lymphs Abs: 1 10*3/uL (ref 0.7–4.0)
MCH: 27.4 pg (ref 26.0–34.0)
MCHC: 31 g/dL (ref 30.0–36.0)
MCV: 88.4 fL (ref 80.0–100.0)
Monocytes Absolute: 0.6 10*3/uL (ref 0.1–1.0)
Monocytes Relative: 10 %
Neutro Abs: 4.4 10*3/uL (ref 1.7–7.7)
Neutrophils Relative %: 74 %
Platelet Count: 347 10*3/uL (ref 150–400)
RBC: 3.8 MIL/uL — ABNORMAL LOW (ref 4.22–5.81)
RDW: 17 % — ABNORMAL HIGH (ref 11.5–15.5)
WBC Count: 6 10*3/uL (ref 4.0–10.5)
nRBC: 0 % (ref 0.0–0.2)

## 2019-06-14 MED ORDER — SODIUM CHLORIDE 0.9 % IV SOLN
60.0000 mg/m2 | Freq: Once | INTRAVENOUS | Status: AC
  Administered 2019-06-14: 110 mg via INTRAVENOUS
  Filled 2019-06-14: qty 11

## 2019-06-14 MED ORDER — DENOSUMAB 120 MG/1.7ML ~~LOC~~ SOLN
120.0000 mg | Freq: Once | SUBCUTANEOUS | Status: AC
  Administered 2019-06-14: 120 mg via SUBCUTANEOUS

## 2019-06-14 MED ORDER — DEXAMETHASONE SODIUM PHOSPHATE 10 MG/ML IJ SOLN
INTRAMUSCULAR | Status: AC
  Filled 2019-06-14: qty 1

## 2019-06-14 MED ORDER — SODIUM CHLORIDE 0.9% FLUSH
10.0000 mL | INTRAVENOUS | Status: DC | PRN
  Administered 2019-06-14: 10 mL
  Filled 2019-06-14: qty 10

## 2019-06-14 MED ORDER — DEXAMETHASONE SODIUM PHOSPHATE 10 MG/ML IJ SOLN
10.0000 mg | Freq: Once | INTRAMUSCULAR | Status: AC
  Administered 2019-06-14: 10 mg via INTRAVENOUS

## 2019-06-14 MED ORDER — PROCHLORPERAZINE MALEATE 10 MG PO TABS: ORAL_TABLET | ORAL | 3 refills | Status: DC

## 2019-06-14 MED ORDER — DENOSUMAB 120 MG/1.7ML ~~LOC~~ SOLN
SUBCUTANEOUS | Status: AC
  Filled 2019-06-14: qty 1.7

## 2019-06-14 MED ORDER — SODIUM CHLORIDE 0.9 % IV SOLN
Freq: Once | INTRAVENOUS | Status: AC
  Filled 2019-06-14: qty 250

## 2019-06-14 MED ORDER — HEPARIN SOD (PORK) LOCK FLUSH 100 UNIT/ML IV SOLN
500.0000 [IU] | Freq: Once | INTRAVENOUS | Status: AC | PRN
  Administered 2019-06-14: 500 [IU]
  Filled 2019-06-14: qty 5

## 2019-06-14 NOTE — Patient Instructions (Addendum)
[facility] Discharge Instructions for Patients Receiving Chemotherapy  Today you received the following chemotherapy agent:  Taxotere  To help prevent nausea and vomiting after your treatment, we encourage you to take your nausea medication as directed by your MD.   If you develop nausea and vomiting that is not controlled by your nausea medication, call the clinic.   BELOW ARE SYMPTOMS THAT SHOULD BE REPORTED IMMEDIATELY:  *FEVER GREATER THAN 100.5 F  *CHILLS WITH OR WITHOUT FEVER  NAUSEA AND VOMITING THAT IS NOT CONTROLLED WITH YOUR NAUSEA MEDICATION  *UNUSUAL SHORTNESS OF BREATH  *UNUSUAL BRUISING OR BLEEDING  TENDERNESS IN MOUTH AND THROAT WITH OR WITHOUT PRESENCE OF ULCERS  *URINARY PROBLEMS  *BOWEL PROBLEMS  UNUSUAL RASH Items with * indicate a potential emergency and should be followed up as soon as possible.  Feel free to call the clinic should you have any questions or concerns. The clinic phone number is [phone].  Please show the [place] at check-in to the Emergency Department and triage nurse.  Denosumab injection What is this medicine? DENOSUMAB (den oh sue mab) slows bone breakdown. Prolia is used to treat osteoporosis in women after menopause and in men, and in people who are taking corticosteroids for 6 months or more. Delton See is used to treat a high calcium level due to cancer and to prevent bone fractures and other bone problems caused by multiple myeloma or cancer bone metastases. Delton See is also used to treat giant cell tumor of the bone. This medicine may be used for other purposes; ask your health care provider or pharmacist if you have questions. COMMON BRAND NAME(S): Prolia, XGEVA What should I tell my health care provider before I take this medicine? They need to know if you have any of these conditions:  dental disease  having surgery or tooth extraction  infection  kidney disease  low levels of calcium or Vitamin D  in the blood  malnutrition  on hemodialysis  skin conditions or sensitivity  thyroid or parathyroid disease  an unusual reaction to denosumab, other medicines, foods, dyes, or preservatives  pregnant or trying to get pregnant  breast-feeding How should I use this medicine? This medicine is for injection under the skin. It is given by a health care professional in a hospital or clinic setting. A special MedGuide will be given to you before each treatment. Be sure to read this information carefully each time. For Prolia, talk to your pediatrician regarding the use of this medicine in children. Special care may be needed. For Delton See, talk to your pediatrician regarding the use of this medicine in children. While this drug may be prescribed for children as young as 13 years for selected conditions, precautions do apply. Overdosage: If you think you have taken too much of this medicine contact a poison control center or emergency room at once. NOTE: This medicine is only for you. Do not share this medicine with others. What if I miss a dose? It is important not to miss your dose. Call your doctor or health care professional if you are unable to keep an appointment. What may interact with this medicine? Do not take this medicine with any of the following medications:  other medicines containing denosumab This medicine may also interact with the following medications:  medicines that lower your chance of fighting infection  steroid medicines like prednisone or cortisone This list may not describe all possible interactions. Give your health care provider a list of all  the medicines, herbs, non-prescription drugs, or dietary supplements you use. Also tell them if you smoke, drink alcohol, or use illegal drugs. Some items may interact with your medicine. What should I watch for while using this medicine? Visit your doctor or health care professional for regular checks on your progress. Your  doctor or health care professional may order blood tests and other tests to see how you are doing. Call your doctor or health care professional for advice if you get a fever, chills or sore throat, or other symptoms of a cold or flu. Do not treat yourself. This drug may decrease your body's ability to fight infection. Try to avoid being around people who are sick. You should make sure you get enough calcium and vitamin D while you are taking this medicine, unless your doctor tells you not to. Discuss the foods you eat and the vitamins you take with your health care professional. See your dentist regularly. Brush and floss your teeth as directed. Before you have any dental work done, tell your dentist you are receiving this medicine. Do not become pregnant while taking this medicine or for 5 months after stopping it. Talk with your doctor or health care professional about your birth control options while taking this medicine. Women should inform their doctor if they wish to become pregnant or think they might be pregnant. There is a potential for serious side effects to an unborn child. Talk to your health care professional or pharmacist for more information. What side effects may I notice from receiving this medicine? Side effects that you should report to your doctor or health care professional as soon as possible:  allergic reactions like skin rash, itching or hives, swelling of the face, lips, or tongue  bone pain  breathing problems  dizziness  jaw pain, especially after dental work  redness, blistering, peeling of the skin  signs and symptoms of infection like fever or chills; cough; sore throat; pain or trouble passing urine  signs of low calcium like fast heartbeat, muscle cramps or muscle pain; pain, tingling, numbness in the hands or feet; seizures  unusual bleeding or bruising  unusually weak or tired Side effects that usually do not require medical attention (report to your  doctor or health care professional if they continue or are bothersome):  constipation  diarrhea  headache  joint pain  loss of appetite  muscle pain  runny nose  tiredness  upset stomach This list may not describe all possible side effects. Call your doctor for medical advice about side effects. You may report side effects to FDA at 1-800-FDA-1088. Where should I keep my medicine? This medicine is only given in a clinic, doctor's office, or other health care setting and will not be stored at home. NOTE: This sheet is a summary. It may not cover all possible information. If you have questions about this medicine, talk to your doctor, pharmacist, or health care provider.  2020 Elsevier/Gold Standard (2017-10-16 16:10:44)   Coronavirus (COVID-19) Are you at risk?  Are you at risk for the Coronavirus (COVID-19)?  To be considered HIGH RISK for Coronavirus (COVID-19), you have to meet the following criteria:  . Traveled to [place], [place], [place], [place] or [place]; or in the [place] to [place], [place], [place], or [place]; and have fever, cough, and shortness of breath within the last 2 weeks of travel OR . Been in close contact with a person diagnosed with COVID-19 within the last 2 weeks  and have fever, cough, and shortness of breath . IF YOU DO NOT MEET THESE CRITERIA, YOU ARE CONSIDERED LOW RISK FOR COVID-19.  What to do if you are HIGH RISK for COVID-19?  [name] If you are having a medical emergency, call 911. . Seek medical care right away. Before you go to a doctor's office, urgent care or emergency department, call ahead and tell them about your recent travel, contact with someone diagnosed with COVID-19, and your symptoms. You should receive instructions from your physician's office regarding next steps of care.  . When you arrive at healthcare provider, tell the healthcare staff immediately you have returned from visiting [place], [place], [place], [place] or [place]; or traveled in the [place] to [place], [place], [place], or [place]; in the last two weeks or you have been in close contact with a person diagnosed with COVID-19 in the last 2 weeks.   . Tell the health care staff about your symptoms: fever, cough and shortness of breath. . After you have been seen by a medical provider, you will be either: o Tested for (COVID-19) and discharged home on quarantine except to seek medical care if symptoms worsen, and asked to  - Stay home and avoid contact with others until you get your results (4-5 days)  - Avoid travel on public transportation if possible (such as bus, train, or airplane) or o Sent to the Emergency Department by EMS for evaluation, COVID-19 testing, and possible admission depending on your condition and test results.  What to do if you are LOW RISK for COVID-19?  Reduce your risk of any infection by using the same precautions used for avoiding the common cold or flu:  [name] Wash your hands often with soap and warm water for at least 20 seconds.  If soap and water are not readily available, use an alcohol-based hand sanitizer with at least 60% alcohol.  . If coughing or sneezing, cover your mouth and nose by coughing or sneezing into the elbow areas of your shirt or coat, into a tissue or into your sleeve (not your hands). . Avoid shaking hands with others and consider head nods or verbal greetings only. . Avoid touching your eyes, nose, or mouth with unwashed hands.  . Avoid close contact with people who are sick. . Avoid places or events with large numbers of people in one location, like concerts or sporting events. . Carefully consider travel plans you have or are making. . If you are planning any travel outside or inside the [place], visit the CDC's Travelers' Health webpage for the latest health notices. . If you have some symptoms but not all symptoms, continue to monitor at home and seek medical attention if your symptoms  worsen. . If you are having a medical emergency, call 911.   [facility] / e-Visit: [email or web address]         [facility]: [facility]: [phone]                   [facility]: [phone]

## 2019-06-14 NOTE — Progress Notes (Signed)
Hematology and Oncology Follow Up Visit  [name] [id number] May 18, 1946 73 y.o. 06/14/2019 8:38 AM    Principle Diagnosis: 73 year old man with advanced prostate cancer with disease to the bone diagnosed in 2013.  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    Prior Therapy: 1. He was started on Lupron after attempted prostatectomy in 2001.    2. 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  3. He is status post radiation therapy to the prostate fossa completed in December 2018. He received 52.5 gr 20 fractions.  4. Zytiga 1000 mg po daily with prednisone 5 mg started in 09/2011.   Current therapy:   Xgeva 120 mg started on 10/31/2011.  This will be given every 6 weeks with chemotherapy.  Lupron 30 mg every 4 months.  Next Eligard will be given in January 2021.   Taxotere chemotherapy 75 mg per metered square with cycle 1 started on 01/19/2019. He is here for cycle 8.   Interim History: [name] presents today for a follow-up visit.  Since the last visit, he tolerated the last cycle of chemotherapy without any major complaints.  He denies any vomiting or abdominal pain.  He does report nausea that is manageable with Compazine.  He denies any abdominal pain or weight loss.  He is eating well and has gained weight.  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  Performance status and quality of life remain excellent.  He denied having abdominal pain.   Patient denied any alteration mental status, neuropathy, confusion or dizziness.  Denies any headaches or lethargy.  Denies any night sweats, weight loss or changes in appetite.  Denied orthopnea, dyspnea on exertion or chest discomfort.  Denies shortness of breath, difficulty breathing hemoptysis or cough.  Denies any abdominal distention, nausea, early satiety or dyspepsia.  Denies any  hematuria, frequency, dysuria or nocturia.  Denies any skin irritation, dryness or rash.  Denies any ecchymosis or petechiae.  Denies any lymphadenopathy or clotting.  Denies any heat or cold intolerance.  Denies any anxiety or depression.  Remaining review of system is negative.          Medications: Updated without any changes. Current Outpatient Medications  Medication Sig Dispense Refill  . albuterol (PROVENTIL HFA;VENTOLIN HFA) 108 (90 Base) MCG/ACT inhaler Inhale 1-2 puffs into the lungs every 6 (six) hours as needed for wheezing or shortness of breath. 1 Inhaler 0  . atorvastatin (LIPITOR) 20 MG tablet Take 1 tablet (20 mg total) by mouth daily. 90 tablet 3  . denosumab (XGEVA) 120 MG/1.7ML SOLN Inject 120 mg into the skin every 30 (thirty) days.    . DULoxetine (CYMBALTA) 60 MG capsule Take 1 capsule (60 mg total) by mouth daily. 90 capsule 2  . EPIPEN 2-PAK 0.3 MG/0.3ML SOAJ injection Inject 0.3 mg into the muscle as needed for anaphylaxis.     . hydrochlorothiazide (HYDRODIURIL) 25 MG tablet Take 1 tablet (25 mg total) by mouth daily. 90 tablet 3  . leuprolide (LUPRON) 30 MG injection Inject 30 mg into the muscle every 4 (four) months.    . lidocaine-prilocaine (EMLA) cream Apply 1 application topically as needed. (Patient taking differently: Apply 1 application topically as needed (port pain). ) 30 g 0  . magnesium chloride (SLOW-MAG) 64 MG TBEC SR tablet Take 1 tablet (64 mg total) by mouth 2 (  two) times daily. 20 tablet 0  . potassium chloride SA (KLOR-CON) 20 MEQ tablet Take 1 tablet (20 mEq total) by mouth 2 (two) times daily. 30 tablet 0  . predniSONE (DELTASONE) 5 MG tablet TAKE 1 TABLET (5 MG TOTAL) BY MOUTH 2 (TWO) TIMES DAILY. (Patient taking differently: Take 5 mg by mouth 2 (two) times daily with a meal. ) 90 tablet 3  . prochlorperazine (COMPAZINE) 10 MG tablet TAKE 1 TABLET BY MOUTH EVERY 6 HOURS AS NEEDED FOR NAUSEA FOR VOMITING 30 tablet 3  . rOPINIRole (REQUIP) 2  MG tablet Take 1 tablet (2 mg total) by mouth at bedtime. 90 tablet 2   No current facility-administered medications for this visit.     Allergies:  Allergies  Allergen Reactions  . Bee Venom Anaphylaxis, Shortness Of Breath and Swelling    Tongue swelling.   . Wasp Venom Anaphylaxis, Shortness Of Breath and Swelling    Tongue swelling   . Percocet [Oxycodone-Acetaminophen] Palpitations    His past medical history, social history and family history unchanged on review.    Physical Exam:  Blood pressure 139/87, pulse 82, temperature 98 F (36.7 C), temperature source Temporal, resp. rate 18, height 5\' 5"  (1.651 m), weight 163 lb 6.4 oz (74.1 kg), SpO2 99 %.       ECOG: 1     General appearance: Comfortable appearing without any discomfort Head: Normocephalic without any trauma Oropharynx: Mucous membranes are moist and pink without any thrush or ulcers. Eyes: Pupils are equal and round reactive to light. Lymph nodes: No cervical, supraclavicular, inguinal or axillary lymphadenopathy.   Heart:regular rate and rhythm.  S1 and S2 without leg edema. Lung: Clear without any rhonchi or wheezes.  No dullness to percussion. Abdomin: Soft, nontender, nondistended with good bowel sounds.  No hepatosplenomegaly. Musculoskeletal: No joint deformity or effusion.  Full range of motion noted. Neurological: No deficits noted on motor, sensory and deep tendon reflex exam. Skin: No petechial rash or dryness.  Appeared moist.              Lab Results: Lab Results  Component Value Date   WBC 6.0 06/14/2019   HGB 10.4 (L) 06/14/2019   HCT 33.6 (L) 06/14/2019   MCV 88.4 06/14/2019   PLT 347 06/14/2019     Chemistry      Component Value Date/Time   NA 143 05/25/2019 1247   NA 141 05/20/2017 1425   K 4.0 05/25/2019 1247   K 4.3 05/20/2017 1425   CL 107 05/25/2019 1247   CL 105 12/08/2012 1504   CO2 27 05/25/2019 1247   CO2 31 (H) 05/20/2017 1425   BUN 14 05/25/2019  1247   BUN 17.8 05/20/2017 1425   CREATININE 0.94 05/25/2019 1247   CREATININE 0.9 05/20/2017 1425      Component Value Date/Time   CALCIUM 8.5 (L) 05/25/2019 1247   CALCIUM 9.4 05/20/2017 1425   ALKPHOS 79 05/25/2019 1247   ALKPHOS 72 05/20/2017 1425   AST 18 05/25/2019 1247   AST 15 05/20/2017 1425   ALT 7 05/25/2019 1247   ALT <6 05/20/2017 1425   BILITOT 0.3 05/25/2019 1247   BILITOT 0.25 05/20/2017 1425                Impression and Plan:   73 year old man with:    1.  Advanced prostate cancer with disease to the bone diagnosed in 2013.  He has castration-resistant disease with therapy outlined above.    He  is currently on Taxotere chemotherapy without any major complications.  He is experiencing worsening neuropathy and nail changes.  Risks and benefits of continuing this therapy to complete 10 cycles were reviewed.  He is experiencing excellent clinical benefit as well as PSA response.  His PSA has more than 50% decrease since the start of therapy.  The plan is to complete 10 cycles of therapy with a dose reduction of Taxotere because of neuropathy.  He will receive 60 mg per metered square for cycle 8, 9 and 10.   2. Bone directed therapy: He remains on Xgeva without complications.  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   3. Androgen deprivation: I recommended long-term androgen deprivation therapy.  Next Eligard will be in January 2021.  Weight gain, hot flashes and osteoporosis are long-term issues associated with this treatment.   5.  IV access: Port-A-Cath remains in place and will continue to be flushed periodically.  6.  Antiemetics: Compazine is available to him and was refilled today.  7.  Growth factor support: He will continue to receive growth factor support after each cycle of therapy.  He is at risk of developing neutropenia.  8.  Followup: He will return in 3 weeks for repeat evaluation next cycle  of therapy.   25  minutes was spent with the patient face-to-face today.  More than 50% of time was dedicated to reviewing his disease status, treatment options and answering questions regarding future plan of care.   Zola Button, MD 12/22/20208:38 AM

## 2019-06-15 ENCOUNTER — Telehealth: Payer: Self-pay

## 2019-06-15 ENCOUNTER — Telehealth: Payer: Self-pay | Admitting: Oncology

## 2019-06-15 LAB — PROSTATE-SPECIFIC AG, SERUM (LABCORP): Prostate Specific Ag, Serum: 56.1 ng/mL — ABNORMAL HIGH (ref 0.0–4.0)

## 2019-06-15 NOTE — Telephone Encounter (Signed)
Called patient and made him aware of PSA results. He verbalized understanding and was appreciative for the call.

## 2019-06-15 NOTE — Telephone Encounter (Signed)
-----   Message from [name], MD sent at 06/15/2019  8:42 AM EST ----- Please let him know his PSA is down.

## 2019-06-15 NOTE — Telephone Encounter (Signed)
Scheduled appt per 12/22 los.  Patient will get a print out at their next scheduled appt.

## 2019-06-16 ENCOUNTER — Inpatient Hospital Stay: Payer: Medicare Other

## 2019-06-16 ENCOUNTER — Other Ambulatory Visit: Payer: Self-pay

## 2019-06-16 VITALS — BP 132/78 | HR 78 | Temp 98.2°F | Resp 18

## 2019-06-16 DIAGNOSIS — Z5111 Encounter for antineoplastic chemotherapy: Secondary | ICD-10-CM | POA: Diagnosis not present

## 2019-06-16 DIAGNOSIS — C61 Malignant neoplasm of prostate: Secondary | ICD-10-CM

## 2019-06-16 MED ORDER — PEGFILGRASTIM-JMDB 6 MG/0.6ML ~~LOC~~ SOSY
PREFILLED_SYRINGE | SUBCUTANEOUS | Status: AC
  Filled 2019-06-16: qty 0.6

## 2019-06-16 MED ORDER — PEGFILGRASTIM-JMDB 6 MG/0.6ML ~~LOC~~ SOSY
6.0000 mg | PREFILLED_SYRINGE | Freq: Once | SUBCUTANEOUS | Status: AC
  Administered 2019-06-16: 6 mg via SUBCUTANEOUS

## 2019-06-16 NOTE — Patient Instructions (Signed)

## 2019-07-06 ENCOUNTER — Other Ambulatory Visit: Payer: Self-pay

## 2019-07-06 ENCOUNTER — Inpatient Hospital Stay: Payer: Medicare Other

## 2019-07-06 ENCOUNTER — Inpatient Hospital Stay (HOSPITAL_BASED_OUTPATIENT_CLINIC_OR_DEPARTMENT_OTHER): Payer: Medicare Other | Admitting: Oncology

## 2019-07-06 ENCOUNTER — Inpatient Hospital Stay: Payer: Medicare Other | Attending: Oncology

## 2019-07-06 ENCOUNTER — Telehealth: Payer: Self-pay | Admitting: Oncology

## 2019-07-06 VITALS — BP 130/73 | HR 94 | Temp 97.8°F | Resp 20 | Wt 165.4 lb

## 2019-07-06 DIAGNOSIS — Z5189 Encounter for other specified aftercare: Secondary | ICD-10-CM | POA: Insufficient documentation

## 2019-07-06 DIAGNOSIS — Z5111 Encounter for antineoplastic chemotherapy: Secondary | ICD-10-CM | POA: Insufficient documentation

## 2019-07-06 DIAGNOSIS — C61 Malignant neoplasm of prostate: Secondary | ICD-10-CM

## 2019-07-06 DIAGNOSIS — C7951 Secondary malignant neoplasm of bone: Secondary | ICD-10-CM | POA: Insufficient documentation

## 2019-07-06 DIAGNOSIS — Z95828 Presence of other vascular implants and grafts: Secondary | ICD-10-CM

## 2019-07-06 LAB — CBC WITH DIFFERENTIAL (CANCER CENTER ONLY)
Abs Immature Granulocytes: 0.01 10*3/uL (ref 0.00–0.07)
Basophils Absolute: 0 10*3/uL (ref 0.0–0.1)
Basophils Relative: 0 %
Eosinophils Absolute: 0 10*3/uL (ref 0.0–0.5)
Eosinophils Relative: 0 %
HCT: 33.9 % — ABNORMAL LOW (ref 39.0–52.0)
Hemoglobin: 10.6 g/dL — ABNORMAL LOW (ref 13.0–17.0)
Immature Granulocytes: 0 %
Lymphocytes Relative: 14 %
Lymphs Abs: 0.7 10*3/uL (ref 0.7–4.0)
MCH: 27 pg (ref 26.0–34.0)
MCHC: 31.3 g/dL (ref 30.0–36.0)
MCV: 86.5 fL (ref 80.0–100.0)
Monocytes Absolute: 0.6 10*3/uL (ref 0.1–1.0)
Monocytes Relative: 12 %
Neutro Abs: 3.7 10*3/uL (ref 1.7–7.7)
Neutrophils Relative %: 74 %
Platelet Count: 279 10*3/uL (ref 150–400)
RBC: 3.92 MIL/uL — ABNORMAL LOW (ref 4.22–5.81)
RDW: 17.2 % — ABNORMAL HIGH (ref 11.5–15.5)
WBC Count: 5.1 10*3/uL (ref 4.0–10.5)
nRBC: 0 % (ref 0.0–0.2)

## 2019-07-06 LAB — CMP (CANCER CENTER ONLY)
ALT: <6 U/L (ref 0–44)
AST: 15 U/L (ref 15–41)
Albumin: 2.9 g/dL — ABNORMAL LOW (ref 3.5–5.0)
Alkaline Phosphatase: 65 U/L (ref 38–126)
Anion gap: 10 (ref 5–15)
BUN: 14 mg/dL (ref 8–23)
CO2: 24 mmol/L (ref 22–32)
Calcium: 8 mg/dL — ABNORMAL LOW (ref 8.9–10.3)
Chloride: 107 mmol/L (ref 98–111)
Creatinine: 0.82 mg/dL (ref 0.61–1.24)
GFR, Est AFR Am: >60 mL/min (ref >60)
GFR, Estimated: >60 mL/min (ref >60)
Glucose, Bld: 122 mg/dL — ABNORMAL HIGH (ref 70–99)
Potassium: 3.5 mmol/L (ref 3.5–5.1)
Sodium: 141 mmol/L (ref 135–145)
Total Bilirubin: 0.2 mg/dL — ABNORMAL LOW (ref 0.3–1.2)
Total Protein: 5.9 g/dL — ABNORMAL LOW (ref 6.5–8.1)

## 2019-07-06 MED ORDER — SODIUM CHLORIDE 0.9 % IV SOLN
60.0000 mg/m2 | Freq: Once | INTRAVENOUS | Status: AC
  Administered 2019-07-06: 110 mg via INTRAVENOUS
  Filled 2019-07-06: qty 11

## 2019-07-06 MED ORDER — HEPARIN SOD (PORK) LOCK FLUSH 100 UNIT/ML IV SOLN
500.0000 [IU] | Freq: Once | INTRAVENOUS | Status: AC | PRN
  Administered 2019-07-06: 500 [IU]
  Filled 2019-07-06: qty 5

## 2019-07-06 MED ORDER — DEXAMETHASONE SODIUM PHOSPHATE 10 MG/ML IJ SOLN
INTRAMUSCULAR | Status: AC
  Filled 2019-07-06: qty 1

## 2019-07-06 MED ORDER — SODIUM CHLORIDE 0.9% FLUSH
10.0000 mL | INTRAVENOUS | Status: DC | PRN
  Administered 2019-07-06: 10 mL
  Filled 2019-07-06: qty 10

## 2019-07-06 MED ORDER — LEUPROLIDE ACETATE (4 MONTH) 30 MG ~~LOC~~ KIT
30.0000 mg | PACK | Freq: Once | SUBCUTANEOUS | Status: AC
  Administered 2019-07-06: 30 mg via SUBCUTANEOUS
  Filled 2019-07-06: qty 30

## 2019-07-06 MED ORDER — SODIUM CHLORIDE 0.9 % IV SOLN
Freq: Once | INTRAVENOUS | Status: AC
  Filled 2019-07-06: qty 250

## 2019-07-06 MED ORDER — DEXAMETHASONE SODIUM PHOSPHATE 10 MG/ML IJ SOLN
10.0000 mg | Freq: Once | INTRAMUSCULAR | Status: AC
  Administered 2019-07-06: 10 mg via INTRAVENOUS

## 2019-07-06 NOTE — Patient Instructions (Addendum)
[facility] Discharge Instructions for Patients Receiving Chemotherapy  Today you received the following chemotherapy agents: Taxotere  To help prevent nausea and vomiting after your treatment, we encourage you to take your nausea medication as directed.   If you develop nausea and vomiting that is not controlled by your nausea medication, call the clinic.   BELOW ARE SYMPTOMS THAT SHOULD BE REPORTED IMMEDIATELY:  *FEVER GREATER THAN 100.5 F  *CHILLS WITH OR WITHOUT FEVER  NAUSEA AND VOMITING THAT IS NOT CONTROLLED WITH YOUR NAUSEA MEDICATION  *UNUSUAL SHORTNESS OF BREATH  *UNUSUAL BRUISING OR BLEEDING  TENDERNESS IN MOUTH AND THROAT WITH OR WITHOUT PRESENCE OF ULCERS  *URINARY PROBLEMS  *BOWEL PROBLEMS  UNUSUAL RASH Items with * indicate a potential emergency and should be followed up as soon as possible.  Feel free to call the clinic should you have any questions or concerns. The clinic phone number is [phone].  Please show the [facility] at check-in to the Emergency Department and triage nurse.  Leuprolide depot injection What is this medicine? LEUPROLIDE (loo PROE lide) is a man-made protein that acts like a natural hormone in the body. It decreases testosterone in men and decreases estrogen in women. In men, this medicine is used to treat advanced prostate cancer. In women, some forms of this medicine may be used to treat endometriosis, uterine fibroids, or other male hormone-related problems. This medicine may be used for other purposes; ask your health care provider or pharmacist if you have questions. COMMON BRAND NAME(S): Eligard, Fensolv, Lupron Depot, Lupron Depot-Ped, Viadur What should I tell my health care provider before I take this medicine? They need to know if you have any of these conditions:  diabetes  heart disease or previous heart attack  high blood pressure  high cholesterol  mental illness  osteoporosis  pain  or difficulty passing urine  seizures  spinal cord metastasis  stroke  suicidal thoughts, plans, or attempt; a previous suicide attempt by you or a family member  tobacco smoker  unusual vaginal bleeding (women)  an unusual or allergic reaction to leuprolide, benzyl alcohol, other medicines, foods, dyes, or preservatives  pregnant or trying to get pregnant  breast-feeding How should I use this medicine? This medicine is for injection into a muscle or for injection under the skin. It is given by a health care professional in a hospital or clinic setting. The specific product will determine how it will be given to you. Make sure you understand which product you receive and how often you will receive it. Talk to your pediatrician regarding the use of this medicine in children. Special care may be needed. Overdosage: If you think you have taken too much of this medicine contact a poison control center or emergency room at once. NOTE: This medicine is only for you. Do not share this medicine with others. What if I miss a dose? It is important not to miss a dose. Call your doctor or health care professional if you are unable to keep an appointment. Depot injections: Depot injections are given either once-monthly, every 12 weeks, every 16 weeks, or every 24 weeks depending on the product you are prescribed. The product you are prescribed will be based on if you are male or male, and your condition. Make sure you understand your product and dosing. What may interact with this medicine? Do not take this medicine with any of the following medications:  chasteberry This medicine may also interact with the  following medications:  herbal or dietary supplements, like black cohosh or DHEA  male hormones, like estrogens or progestins and birth control pills, patches, rings, or injections  male hormones, like testosterone This list may not describe all possible interactions. Give your health  care provider a list of all the medicines, herbs, non-prescription drugs, or dietary supplements you use. Also tell them if you smoke, drink alcohol, or use illegal drugs. Some items may interact with your medicine. What should I watch for while using this medicine? Visit your doctor or health care professional for regular checks on your progress. During the first weeks of treatment, your symptoms may get worse, but then will improve as you continue your treatment. You may get hot flashes, increased bone pain, increased difficulty passing urine, or an aggravation of nerve symptoms. Discuss these effects with your doctor or health care professional, some of them may improve with continued use of this medicine. Male patients may experience a menstrual cycle or spotting during the first months of therapy with this medicine. If this continues, contact your doctor or health care professional. This medicine may increase blood sugar. Ask your healthcare provider if changes in diet or medicines are needed if you have diabetes. What side effects may I notice from receiving this medicine? Side effects that you should report to your doctor or health care professional as soon as possible:  allergic reactions like skin rash, itching or hives, swelling of the face, lips, or tongue  breathing problems  chest pain  depression or memory disorders  pain in your legs or groin  pain at site where injected or implanted  seizures  severe headache  signs and symptoms of high blood sugar such as being more thirsty or hungry or having to urinate more than normal. You may also feel very tired or have blurry vision  swelling of the feet and legs  suicidal thoughts or other mood changes  visual changes  vomiting Side effects that usually do not require medical attention (report to your doctor or health care professional if they continue or are bothersome):  breast swelling or tenderness  decrease in sex  drive or performance  diarrhea  hot flashes  loss of appetite  muscle, joint, or bone pains  nausea  redness or irritation at site where injected or implanted  skin problems or acne This list may not describe all possible side effects. Call your doctor for medical advice about side effects. You may report side effects to FDA at 1-800-FDA-1088. Where should I keep my medicine? This drug is given in a hospital or clinic and will not be stored at home. NOTE: This sheet is a summary. It may not cover all possible information. If you have questions about this medicine, talk to your doctor, pharmacist, or health care provider.  2020 Elsevier/Gold Standard (2018-04-08 09:27:03)

## 2019-07-06 NOTE — Telephone Encounter (Signed)
Scheduled appt per 1/13 los.

## 2019-07-06 NOTE — Progress Notes (Signed)
LATE ENTRY- At 9:45 am, Diane from lab called with a critical value of Creatinine of 5.3.  Walked over to Dr. Hazeline Junker office and told him the value to his face.  He responded that it was noted.  Gardiner Rhyme, RN

## 2019-07-06 NOTE — Progress Notes (Signed)
Hematology and Oncology Follow Up Visit  JATHNIEL POLINSKY [id number] 11-12-1945 74 y.o. 07/06/2019 9:37 AM    Principle Diagnosis: 74 year old man with castration-resistant prostate cancer diagnosed in 2013.  He has advanced disease at time of presentation with Gleason score of 4+5 = 9 in 2001.   Prior Therapy: 1. He was started on Lupron after attempted prostatectomy in 2001.    2. 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  3. He is status post radiation therapy to the prostate fossa completed in December 2018. He received 52.5 gr 20 fractions.  4. Zytiga 1000 mg po daily with prednisone 5 mg started in 09/2011.   Current therapy:   Xgeva 120 mg started on 10/31/2011.  This will be given every 6 weeks with chemotherapy.  Lupron 30 mg every 4 months.  Next Eligard will be given in January 2021.   Taxotere chemotherapy 75 mg per metered square with cycle 1 started on 01/19/2019. He is here for cycle 9.   Interim History: Mr. Kanhai returns today for repeat evaluation.  Since the last visit, he continues to tolerate systemic chemotherapy without any major complaints.  He does have grade 1 sensory neuropathy but is not interfering with any function at this time.  Continues to be active and attends to activities of daily living.  His appetite and performance status remain excellent he does not report any nausea or vomiting.          Medications: Unchanged on review. Current Outpatient Medications  Medication Sig Dispense Refill  . albuterol (PROVENTIL HFA;VENTOLIN HFA) 108 (90 Base) MCG/ACT inhaler Inhale 1-2 puffs into the lungs every 6 (six) hours as needed for wheezing or shortness of breath. 1 Inhaler 0  . atorvastatin (LIPITOR) 20 MG tablet Take 1 tablet (20 mg total) by mouth daily. 90 tablet 3  . denosumab (XGEVA) 120 MG/1.7ML SOLN Inject 120 mg into the skin every 30 (thirty) days.    . DULoxetine (CYMBALTA) 60 MG  capsule Take 1 capsule (60 mg total) by mouth daily. 90 capsule 2  . EPIPEN 2-PAK 0.3 MG/0.3ML SOAJ injection Inject 0.3 mg into the muscle as needed for anaphylaxis.     . hydrochlorothiazide (HYDRODIURIL) 25 MG tablet Take 1 tablet (25 mg total) by mouth daily. 90 tablet 3  . leuprolide (LUPRON) 30 MG injection Inject 30 mg into the muscle every 4 (four) months.    . lidocaine-prilocaine (EMLA) cream Apply 1 application topically as needed. (Patient taking differently: Apply 1 application topically as needed (port pain). ) 30 g 0  . magnesium chloride (SLOW-MAG) 64 MG TBEC SR tablet Take 1 tablet (64 mg total) by mouth 2 (two) times daily. 20 tablet 0  . potassium chloride SA (KLOR-CON) 20 MEQ tablet Take 1 tablet (20 mEq total) by mouth 2 (two) times daily. 30 tablet 0  . predniSONE (DELTASONE) 5 MG tablet TAKE 1 TABLET (5 MG TOTAL) BY MOUTH 2 (TWO) TIMES DAILY. (Patient taking differently: Take 5 mg by mouth 2 (two) times daily with a meal. ) 90 tablet 3  . prochlorperazine (COMPAZINE) 10 MG tablet TAKE 1 TABLET BY MOUTH EVERY 6 HOURS AS NEEDED FOR NAUSEA FOR VOMITING 30 tablet 3  . rOPINIRole (REQUIP) 2 MG tablet Take 1 tablet (2 mg total) by mouth at bedtime. 90 tablet 2   No current facility-administered medications for this visit.     Allergies:  Allergies  Allergen Reactions  .  Bee Venom Anaphylaxis, Shortness Of Breath and Swelling    Tongue swelling.   . Wasp Venom Anaphylaxis, Shortness Of Breath and Swelling    Tongue swelling   . Percocet [Oxycodone-Acetaminophen] Palpitations    His past medical history, social history and family history reviewed without any changes.    Physical Exam:   Blood pressure 130/73, pulse 94, temperature 97.8 F (36.6 C), temperature source Temporal, resp. rate 20, weight 165 lb 6.4 oz (75 kg), SpO2 98 %.       ECOG: 1      General appearance: Alert, awake without any distress. Head: Atraumatic without abnormalities Oropharynx:  Without any thrush or ulcers. Eyes: No scleral icterus. Lymph nodes: No lymphadenopathy noted in the cervical, supraclavicular, or axillary nodes Heart:regular rate and rhythm, without any murmurs or gallops.   Lung: Clear to auscultation without any rhonchi, wheezes or dullness to percussion. Abdomin: Soft, nontender without any shifting dullness or ascites. Musculoskeletal: No clubbing or cyanosis. Neurological: No motor or sensory deficits. Skin: No rashes or lesions. Psychiatric: Mood and affect appeared normal.             Lab Results: Lab Results  Component Value Date   WBC 6.0 06/14/2019   HGB 10.4 (L) 06/14/2019   HCT 33.6 (L) 06/14/2019   MCV 88.4 06/14/2019   PLT 347 06/14/2019     Chemistry      Component Value Date/Time   NA 143 06/14/2019 0800   NA 141 05/20/2017 1425   K 4.3 06/14/2019 0800   K 4.3 05/20/2017 1425   CL 108 06/14/2019 0800   CL 105 12/08/2012 1504   CO2 23 06/14/2019 0800   CO2 31 (H) 05/20/2017 1425   BUN 22 06/14/2019 0800   BUN 17.8 05/20/2017 1425   CREATININE 0.84 06/14/2019 0800   CREATININE 0.9 05/20/2017 1425      Component Value Date/Time   CALCIUM 9.0 06/14/2019 0800   CALCIUM 9.4 05/20/2017 1425   ALKPHOS 75 06/14/2019 0800   ALKPHOS 72 05/20/2017 1425   AST 19 06/14/2019 0800   AST 15 05/20/2017 1425   ALT 7 06/14/2019 0800   ALT <6 05/20/2017 1425   BILITOT <0.2 (L) 06/14/2019 0800   BILITOT 0.25 05/20/2017 1425                Impression and Plan:   74 year old man with:    1. 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     He is currently receiving Taxotere chemotherapy without any major complications.  His dose was reduced because of neuropathy and overall better tolerance.  Risks and benefits of proceeding with the treatment today was reviewed.  Long-term complication including worsening neuropathy, myelosuppression in addition to hair and nail changes were  reviewed.  He is agreeable to continue.  The duration of chemotherapy was discussed today and will be released for 10 cycles depending on his future tolerance.   2. Bone directed therapy: He is at risk of pathological fractures and currently on Xgeva.  Long-term complications including osteonecrosis of the jaw and hypocalcemia were reiterated.  His next injection will be in February 2021.   3. Androgen deprivation: He will receive Eligard today and repeated in 4 months.  Long-term complication occluding weight gain osteoporosis were reviewed.  He is agreeable to continue.   5.  IV access: Port-A-Cath currently in use without any issues.  6.  Antiemetics: Manageable with current Compazine at this time.  No  nausea or vomiting.  7.  Growth factor support: He is at risk of developing neutropenia and sepsis.  He will receive growth of support after each cycle of therapy.  8.  Followup: In 3 weeks for the next cycle of therapy.   30  minutes was spent on this encounter today.  The time was dedicated to reviewing laboratory data, reviewing disease status update, complications related therapy as well as future plan of care.   Zola Button, MD 1/13/20219:37 AM

## 2019-07-07 ENCOUNTER — Telehealth: Payer: Self-pay

## 2019-07-07 LAB — PROSTATE-SPECIFIC AG, SERUM (LABCORP): Prostate Specific Ag, Serum: 50.9 ng/mL — ABNORMAL HIGH (ref 0.0–4.0)

## 2019-07-07 NOTE — Telephone Encounter (Signed)
Called patient and made him aware of PSA result. Patient verbalized understanding.  

## 2019-07-07 NOTE — Telephone Encounter (Signed)
-----   Message from [name], MD sent at 07/07/2019  8:18 AM EST ----- Please let him know his PSA is down

## 2019-07-08 ENCOUNTER — Inpatient Hospital Stay: Payer: Medicare Other

## 2019-07-08 ENCOUNTER — Other Ambulatory Visit: Payer: Self-pay

## 2019-07-08 VITALS — BP 120/72 | HR 88 | Temp 98.2°F | Resp 18

## 2019-07-08 DIAGNOSIS — C61 Malignant neoplasm of prostate: Secondary | ICD-10-CM

## 2019-07-08 DIAGNOSIS — Z5111 Encounter for antineoplastic chemotherapy: Secondary | ICD-10-CM | POA: Diagnosis not present

## 2019-07-08 MED ORDER — PEGFILGRASTIM-JMDB 6 MG/0.6ML ~~LOC~~ SOSY
6.0000 mg | PREFILLED_SYRINGE | Freq: Once | SUBCUTANEOUS | Status: AC
  Administered 2019-07-08: 6 mg via SUBCUTANEOUS

## 2019-07-08 MED ORDER — PEGFILGRASTIM-JMDB 6 MG/0.6ML ~~LOC~~ SOSY
PREFILLED_SYRINGE | SUBCUTANEOUS | Status: AC
  Filled 2019-07-08: qty 0.6

## 2019-07-08 NOTE — Patient Instructions (Signed)

## 2019-07-27 ENCOUNTER — Inpatient Hospital Stay: Payer: Medicare Other | Admitting: Oncology

## 2019-07-27 ENCOUNTER — Inpatient Hospital Stay: Payer: Medicare Other

## 2019-07-27 ENCOUNTER — Other Ambulatory Visit: Payer: Self-pay

## 2019-07-27 ENCOUNTER — Telehealth: Payer: Self-pay | Admitting: Oncology

## 2019-07-27 ENCOUNTER — Inpatient Hospital Stay: Payer: Medicare Other | Attending: Oncology

## 2019-07-27 VITALS — BP 152/85 | HR 95 | Temp 99.1°F | Resp 17 | Ht 65.0 in | Wt 182.9 lb

## 2019-07-27 DIAGNOSIS — C61 Malignant neoplasm of prostate: Secondary | ICD-10-CM

## 2019-07-27 DIAGNOSIS — C7951 Secondary malignant neoplasm of bone: Secondary | ICD-10-CM | POA: Diagnosis present

## 2019-07-27 DIAGNOSIS — Z5111 Encounter for antineoplastic chemotherapy: Secondary | ICD-10-CM | POA: Insufficient documentation

## 2019-07-27 DIAGNOSIS — Z5189 Encounter for other specified aftercare: Secondary | ICD-10-CM | POA: Insufficient documentation

## 2019-07-27 DIAGNOSIS — Z95828 Presence of other vascular implants and grafts: Secondary | ICD-10-CM

## 2019-07-27 LAB — CMP (CANCER CENTER ONLY)
ALT: 7 U/L (ref 0–44)
AST: 17 U/L (ref 15–41)
Albumin: 2.8 g/dL — ABNORMAL LOW (ref 3.5–5.0)
Alkaline Phosphatase: 69 U/L (ref 38–126)
Anion gap: 8 (ref 5–15)
BUN: 15 mg/dL (ref 8–23)
CO2: 28 mmol/L (ref 22–32)
Calcium: 8.5 mg/dL — ABNORMAL LOW (ref 8.9–10.3)
Chloride: 106 mmol/L (ref 98–111)
Creatinine: 0.78 mg/dL (ref 0.61–1.24)
GFR, Est AFR Am: >60 mL/min (ref >60)
GFR, Estimated: >60 mL/min (ref >60)
Glucose, Bld: 102 mg/dL — ABNORMAL HIGH (ref 70–99)
Potassium: 4 mmol/L (ref 3.5–5.1)
Sodium: 142 mmol/L (ref 135–145)
Total Bilirubin: 0.3 mg/dL (ref 0.3–1.2)
Total Protein: 5.9 g/dL — ABNORMAL LOW (ref 6.5–8.1)

## 2019-07-27 LAB — CBC WITH DIFFERENTIAL (CANCER CENTER ONLY)
Abs Immature Granulocytes: 0.01 10*3/uL (ref 0.00–0.07)
Basophils Absolute: 0 10*3/uL (ref 0.0–0.1)
Basophils Relative: 1 %
Eosinophils Absolute: 0 10*3/uL (ref 0.0–0.5)
Eosinophils Relative: 1 %
HCT: 34 % — ABNORMAL LOW (ref 39.0–52.0)
Hemoglobin: 10.5 g/dL — ABNORMAL LOW (ref 13.0–17.0)
Immature Granulocytes: 0 %
Lymphocytes Relative: 16 %
Lymphs Abs: 0.8 10*3/uL (ref 0.7–4.0)
MCH: 26.1 pg (ref 26.0–34.0)
MCHC: 30.9 g/dL (ref 30.0–36.0)
MCV: 84.4 fL (ref 80.0–100.0)
Monocytes Absolute: 0.7 10*3/uL (ref 0.1–1.0)
Monocytes Relative: 14 %
Neutro Abs: 3.6 10*3/uL (ref 1.7–7.7)
Neutrophils Relative %: 68 %
Platelet Count: 324 10*3/uL (ref 150–400)
RBC: 4.03 MIL/uL — ABNORMAL LOW (ref 4.22–5.81)
RDW: 17.6 % — ABNORMAL HIGH (ref 11.5–15.5)
WBC Count: 5.3 10*3/uL (ref 4.0–10.5)
nRBC: 0 % (ref 0.0–0.2)

## 2019-07-27 MED ORDER — SODIUM CHLORIDE 0.9 % IV SOLN
60.0000 mg/m2 | Freq: Once | INTRAVENOUS | Status: AC
  Administered 2019-07-27: 110 mg via INTRAVENOUS
  Filled 2019-07-27: qty 11

## 2019-07-27 MED ORDER — DENOSUMAB 120 MG/1.7ML ~~LOC~~ SOLN
120.0000 mg | Freq: Once | SUBCUTANEOUS | Status: AC
  Administered 2019-07-27: 120 mg via SUBCUTANEOUS

## 2019-07-27 MED ORDER — DEXAMETHASONE SODIUM PHOSPHATE 10 MG/ML IJ SOLN
10.0000 mg | Freq: Once | INTRAMUSCULAR | Status: AC
  Administered 2019-07-27: 10 mg via INTRAVENOUS

## 2019-07-27 MED ORDER — DENOSUMAB 120 MG/1.7ML ~~LOC~~ SOLN
SUBCUTANEOUS | Status: AC
  Filled 2019-07-27: qty 1.7

## 2019-07-27 MED ORDER — SODIUM CHLORIDE 0.9% FLUSH
10.0000 mL | INTRAVENOUS | Status: DC | PRN
  Administered 2019-07-27: 10 mL
  Filled 2019-07-27: qty 10

## 2019-07-27 MED ORDER — SODIUM CHLORIDE 0.9 % IV SOLN
Freq: Once | INTRAVENOUS | Status: AC
  Filled 2019-07-27: qty 250

## 2019-07-27 MED ORDER — DEXAMETHASONE SODIUM PHOSPHATE 10 MG/ML IJ SOLN
INTRAMUSCULAR | Status: AC
  Filled 2019-07-27: qty 1

## 2019-07-27 MED ORDER — HEPARIN SOD (PORK) LOCK FLUSH 100 UNIT/ML IV SOLN
500.0000 [IU] | Freq: Once | INTRAVENOUS | Status: AC | PRN
  Administered 2019-07-27: 500 [IU]
  Filled 2019-07-27: qty 5

## 2019-07-27 NOTE — Patient Instructions (Addendum)
[facility] Discharge Instructions for Patients Receiving Chemotherapy  Today you received the following chemotherapy agents: Taxotere  To help prevent nausea and vomiting after your treatment, we encourage you to take your nausea medication as directed.    If you develop nausea and vomiting that is not controlled by your nausea medication, call the clinic.   BELOW ARE SYMPTOMS THAT SHOULD BE REPORTED IMMEDIATELY:  *FEVER GREATER THAN 100.5 F  *CHILLS WITH OR WITHOUT FEVER  NAUSEA AND VOMITING THAT IS NOT CONTROLLED WITH YOUR NAUSEA MEDICATION  *UNUSUAL SHORTNESS OF BREATH  *UNUSUAL BRUISING OR BLEEDING  TENDERNESS IN MOUTH AND THROAT WITH OR WITHOUT PRESENCE OF ULCERS  *URINARY PROBLEMS  *BOWEL PROBLEMS  UNUSUAL RASH Items with * indicate a potential emergency and should be followed up as soon as possible.  Feel free to call the clinic should you have any questions or concerns. The clinic phone number is [phone].  Please show the [place] at check-in to the Emergency Department and triage nurse.  Denosumab injection What is this medicine? DENOSUMAB (den oh sue mab) slows bone breakdown. Prolia is used to treat osteoporosis in women after menopause and in men, and in people who are taking corticosteroids for 6 months or more. Delton See is used to treat a high calcium level due to cancer and to prevent bone fractures and other bone problems caused by multiple myeloma or cancer bone metastases. Delton See is also used to treat giant cell tumor of the bone. This medicine may be used for other purposes; ask your health care provider or pharmacist if you have questions. COMMON BRAND NAME(S): Prolia, XGEVA What should I tell my health care provider before I take this medicine? They need to know if you have any of these conditions:  dental disease  having surgery or tooth extraction  infection  kidney disease  low levels of calcium or Vitamin D in the  blood  malnutrition  on hemodialysis  skin conditions or sensitivity  thyroid or parathyroid disease  an unusual reaction to denosumab, other medicines, foods, dyes, or preservatives  pregnant or trying to get pregnant  breast-feeding How should I use this medicine? This medicine is for injection under the skin. It is given by a health care professional in a hospital or clinic setting. A special MedGuide will be given to you before each treatment. Be sure to read this information carefully each time. For Prolia, talk to your pediatrician regarding the use of this medicine in children. Special care may be needed. For Delton See, talk to your pediatrician regarding the use of this medicine in children. While this drug may be prescribed for children as young as 13 years for selected conditions, precautions do apply. Overdosage: If you think you have taken too much of this medicine contact a poison control center or emergency room at once. NOTE: This medicine is only for you. Do not share this medicine with others. What if I miss a dose? It is important not to miss your dose. Call your doctor or health care professional if you are unable to keep an appointment. What may interact with this medicine? Do not take this medicine with any of the following medications:  other medicines containing denosumab This medicine may also interact with the following medications:  medicines that lower your chance of fighting infection  steroid medicines like prednisone or cortisone This list may not describe all possible interactions. Give your health care provider a list of all the medicines, herbs,  non-prescription drugs, or dietary supplements you use. Also tell them if you smoke, drink alcohol, or use illegal drugs. Some items may interact with your medicine. What should I watch for while using this medicine? Visit your doctor or health care professional for regular checks on your progress. Your doctor or  health care professional may order blood tests and other tests to see how you are doing. Call your doctor or health care professional for advice if you get a fever, chills or sore throat, or other symptoms of a cold or flu. Do not treat yourself. This drug may decrease your body's ability to fight infection. Try to avoid being around people who are sick. You should make sure you get enough calcium and vitamin D while you are taking this medicine, unless your doctor tells you not to. Discuss the foods you eat and the vitamins you take with your health care professional. See your dentist regularly. Brush and floss your teeth as directed. Before you have any dental work done, tell your dentist you are receiving this medicine. Do not become pregnant while taking this medicine or for 5 months after stopping it. Talk with your doctor or health care professional about your birth control options while taking this medicine. Women should inform their doctor if they wish to become pregnant or think they might be pregnant. There is a potential for serious side effects to an unborn child. Talk to your health care professional or pharmacist for more information. What side effects may I notice from receiving this medicine? Side effects that you should report to your doctor or health care professional as soon as possible:  allergic reactions like skin rash, itching or hives, swelling of the face, lips, or tongue  bone pain  breathing problems  dizziness  jaw pain, especially after dental work  redness, blistering, peeling of the skin  signs and symptoms of infection like fever or chills; cough; sore throat; pain or trouble passing urine  signs of low calcium like fast heartbeat, muscle cramps or muscle pain; pain, tingling, numbness in the hands or feet; seizures  unusual bleeding or bruising  unusually weak or tired Side effects that usually do not require medical attention (report to your doctor or  health care professional if they continue or are bothersome):  constipation  diarrhea  headache  joint pain  loss of appetite  muscle pain  runny nose  tiredness  upset stomach This list may not describe all possible side effects. Call your doctor for medical advice about side effects. You may report side effects to FDA at 1-800-FDA-1088. Where should I keep my medicine? This medicine is only given in a clinic, doctor's office, or other health care setting and will not be stored at home. NOTE: This sheet is a summary. It may not cover all possible information. If you have questions about this medicine, talk to your doctor, pharmacist, or health care provider.  2020 Elsevier/Gold Standard (2017-10-16 16:10:44)

## 2019-07-27 NOTE — Progress Notes (Signed)
Hematology and Oncology Follow Up Visit  AGNEW ZINCK [id number] [birth year] 74 y.o. 07/27/2019 8:36 AM    Principle Diagnosis: 74 year old man with advanced prostate cancer with disease to the bone since 2013.  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  Initial Gleason score of 4+5 = 9.    Prior Therapy: 1. He was started on Lupron after attempted prostatectomy in 2001.    2. 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  3. He is status post radiation therapy to the prostate fossa completed in December 2018. He received 52.5 gr 20 fractions.  4. Zytiga 1000 mg po daily with prednisone 5 mg started in 09/2011.   Current therapy:   Xgeva 120 mg started on 10/31/2011.  This will be given every 6 weeks with chemotherapy.  Lupron 30 mg every 4 months.  Next Eligard will be given in January 2021.   Taxotere chemotherapy 75 mg per metered square with cycle 1 started on 01/19/2019. He is here for cycle 10.    Interim History: Mr. Cacchione presents today for a follow-up visit.  Since her last visit, he reports no major changes in his health.  He is reporting more fatigue and tiredness and worsening sensory neuropathy.  He denies any motor neuropathy at this time.  Continues to ambulate without any difficulties.  She will able to attend to activities of daily living.          Medications: Updated on review Current Outpatient Medications  Medication Sig Dispense Refill  . albuterol (PROVENTIL HFA;VENTOLIN HFA) 108 (90 Base) MCG/ACT inhaler Inhale 1-2 puffs into the lungs every 6 (six) hours as needed for wheezing or shortness of breath. 1 Inhaler 0  . atorvastatin (LIPITOR) 20 MG tablet Take 1 tablet (20 mg total) by mouth daily. 90 tablet 3  . denosumab (XGEVA) 120 MG/1.7ML SOLN Inject 120 mg into the skin every 30 (thirty) days.    . DULoxetine (CYMBALTA) 60 MG capsule Take 1 capsule (60  mg total) by mouth daily. 90 capsule 2  . EPIPEN 2-PAK 0.3 MG/0.3ML SOAJ injection Inject 0.3 mg into the muscle as needed for anaphylaxis.     . hydrochlorothiazide (HYDRODIURIL) 25 MG tablet Take 1 tablet (25 mg total) by mouth daily. 90 tablet 3  . leuprolide (LUPRON) 30 MG injection Inject 30 mg into the muscle every 4 (four) months.    . lidocaine-prilocaine (EMLA) cream Apply 1 application topically as needed. (Patient taking differently: Apply 1 application topically as needed (port pain). ) 30 g 0  . magnesium chloride (SLOW-MAG) 64 MG TBEC SR tablet Take 1 tablet (64 mg total) by mouth 2 (two) times daily. 20 tablet 0  . potassium chloride SA (KLOR-CON) 20 MEQ tablet Take 1 tablet (20 mEq total) by mouth 2 (two) times daily. 30 tablet 0  . predniSONE (DELTASONE) 5 MG tablet TAKE 1 TABLET (5 MG TOTAL) BY MOUTH 2 (TWO) TIMES DAILY. (Patient taking differently: Take 5 mg by mouth 2 (two) times daily with a meal. ) 90 tablet 3  . prochlorperazine (COMPAZINE) 10 MG tablet TAKE 1 TABLET BY MOUTH EVERY 6 HOURS AS NEEDED FOR NAUSEA FOR VOMITING 30 tablet 3  . rOPINIRole (REQUIP) 2 MG tablet Take 1 tablet (2 mg total) by mouth at bedtime. 90 tablet 2   No current facility-administered medications for this visit.   Facility-Administered Medications Ordered in Other Visits  Medication Dose Route  Frequency Provider Last Rate Last Admin  . sodium chloride flush (NS) 0.9 % injection 10 mL  10 mL Intracatheter PRN [name], MD   10 mL at 07/27/19 [id number]     Allergies:  Allergies  Allergen Reactions  . Bee Venom Anaphylaxis, Shortness Of Breath and Swelling    Tongue swelling.   . Wasp Venom Anaphylaxis, Shortness Of Breath and Swelling    Tongue swelling   . Percocet [Oxycodone-Acetaminophen] Palpitations       Physical Exam:    Blood pressure 139/79, pulse 85, temperature 98.7 F (37.1 C), temperature source Temporal, resp. rate 20, height 5\' 5"  (1.651 m), weight 167 lb (75.8  kg), SpO2 95 %.       ECOG: 1    General appearance: Comfortable appearing without any discomfort Head: Normocephalic without any trauma Oropharynx: Mucous membranes are moist and pink without any thrush or ulcers. Eyes: Pupils are equal and round reactive to light. Lymph nodes: No cervical, supraclavicular, inguinal or axillary lymphadenopathy.   Heart:regular rate and rhythm.  S1 and S2 without leg edema. Lung: Clear without any rhonchi or wheezes.  No dullness to percussion. Abdomin: Soft, nontender, nondistended with good bowel sounds.  No hepatosplenomegaly. Musculoskeletal: No joint deformity or effusion.  Full range of motion noted. Neurological: No deficits noted on motor, sensory and deep tendon reflex exam. Skin: No petechial rash or dryness.  Appeared moist.               Lab Results: Lab Results  Component Value Date   WBC 5.1 07/06/2019   HGB 10.6 (L) 07/06/2019   HCT 33.9 (L) 07/06/2019   MCV 86.5 07/06/2019   PLT 279 07/06/2019     Chemistry      Component Value Date/Time   NA 141 07/06/2019 0930   NA 141 05/20/2017 1425   K 3.5 07/06/2019 0930   K 4.3 05/20/2017 1425   CL 107 07/06/2019 0930   CL 105 12/08/2012 1504   CO2 24 07/06/2019 0930   CO2 31 (H) 05/20/2017 1425   BUN 14 07/06/2019 0930   BUN 17.8 05/20/2017 1425   CREATININE 0.82 07/06/2019 0930   CREATININE 0.9 05/20/2017 1425      Component Value Date/Time   CALCIUM 8.0 (L) 07/06/2019 0930   CALCIUM 9.4 05/20/2017 1425   ALKPHOS 65 07/06/2019 0930   ALKPHOS 72 05/20/2017 1425   AST 15 07/06/2019 0930   AST 15 05/20/2017 1425   ALT <6 07/06/2019 0930   ALT <6 05/20/2017 1425   BILITOT 0.2 (L) 07/06/2019 0930   BILITOT 0.25 05/20/2017 1425       Results for AKO, BRUNICK (MRN [id number]) as of 07/27/2019 08:39  Ref. Range 05/25/2019 12:47 06/14/2019 08:00 07/06/2019 09:30  Prostate Specific Ag, Serum Latest Ref Range: 0.0 - 4.0 ng/mL 63.5 (H) 56.1 (H) 50.9 (H)            Impression and Plan:   74 year old man with:    1.  Advanced prostate cancer with disease to the bone diagnosed in 2013.  He has castration-resistant disease.   He continues to tolerate Taxotere chemotherapy with reasonable PSA response currently at 50.9 with a more than 50% response by PSA criteria.  Risks and benefits of continuing chemotherapy was discussed.  The duration of therapy was also debated which include a total of 10 cycles versus treatment until disease progression.  I have recommended continuing the same dose and schedule and proceeding with a treatment  break if his PSA plateaus.  Given the accumulation of toxicities we will consider holding chemotherapy in 3 weeks pending his evaluation.  2. Bone directed therapy: I recommended continuing Xgeva every 6 weeks.  Complications associated with this medication including osteonecrosis of the jaw and hypocalcemia.  Last injection was given on December 22 and will be repeated today.  3. Androgen deprivation: Next Eligard will be given scan May 2021.  Long-term complications including weight gain and hot flashes were reiterated.   5.  IV access: Port-A-Cath remains in place and will be used periodically for chemotherapy.  6.  Antiemetics: No recent exacerbation with his nausea vomiting Compazine is available to him.  7.  Growth factor support: He will require growth factor support after each cycle given his risk of neutropenia and sepsis.  8.  Followup: He will return in 3 weeks for the next cycle of therapy.   30  minutes was dedicated to the visit today.  The time was spent on reviewing laboratory data, disease status update treatment options and addressing complex lytic therapy.   Zola Button, MD 2/3/20218:36 AM

## 2019-07-27 NOTE — Telephone Encounter (Signed)
Scheduled appt per 2/3 los.

## 2019-07-28 ENCOUNTER — Telehealth: Payer: Self-pay

## 2019-07-28 LAB — PROSTATE-SPECIFIC AG, SERUM (LABCORP): Prostate Specific Ag, Serum: 47.3 ng/mL — ABNORMAL HIGH (ref 0.0–4.0)

## 2019-07-28 NOTE — Telephone Encounter (Signed)
Called and informed patient of lab results listed below. Patient verbalized understanding.  

## 2019-07-28 NOTE — Telephone Encounter (Signed)
-----   Message from [name], MD sent at 07/28/2019  8:18 AM EST ----- Please let him know his PSA is down.

## 2019-07-29 ENCOUNTER — Other Ambulatory Visit: Payer: Self-pay

## 2019-07-29 ENCOUNTER — Inpatient Hospital Stay: Payer: Medicare Other

## 2019-07-29 VITALS — BP 98/64 | HR 103 | Temp 98.9°F | Resp 18

## 2019-07-29 DIAGNOSIS — Z5111 Encounter for antineoplastic chemotherapy: Secondary | ICD-10-CM | POA: Diagnosis not present

## 2019-07-29 DIAGNOSIS — C61 Malignant neoplasm of prostate: Secondary | ICD-10-CM

## 2019-07-29 MED ORDER — PEGFILGRASTIM-JMDB 6 MG/0.6ML ~~LOC~~ SOSY
6.0000 mg | PREFILLED_SYRINGE | Freq: Once | SUBCUTANEOUS | Status: AC
  Administered 2019-07-29: 6 mg via SUBCUTANEOUS

## 2019-07-29 MED ORDER — PEGFILGRASTIM-JMDB 6 MG/0.6ML ~~LOC~~ SOSY
PREFILLED_SYRINGE | SUBCUTANEOUS | Status: AC
  Filled 2019-07-29: qty 0.6

## 2019-07-29 NOTE — Patient Instructions (Signed)

## 2019-08-17 ENCOUNTER — Inpatient Hospital Stay: Payer: Medicare Other

## 2019-08-17 ENCOUNTER — Other Ambulatory Visit: Payer: Self-pay

## 2019-08-17 ENCOUNTER — Inpatient Hospital Stay: Payer: Medicare Other | Admitting: Oncology

## 2019-08-17 VITALS — BP 127/80 | HR 86 | Temp 98.9°F | Resp 17 | Ht 65.0 in | Wt 162.9 lb

## 2019-08-17 DIAGNOSIS — C61 Malignant neoplasm of prostate: Secondary | ICD-10-CM

## 2019-08-17 DIAGNOSIS — Z95828 Presence of other vascular implants and grafts: Secondary | ICD-10-CM | POA: Diagnosis not present

## 2019-08-17 DIAGNOSIS — Z5111 Encounter for antineoplastic chemotherapy: Secondary | ICD-10-CM | POA: Diagnosis not present

## 2019-08-17 LAB — CBC WITH DIFFERENTIAL (CANCER CENTER ONLY)
Abs Immature Granulocytes: 0.02 10*3/uL (ref 0.00–0.07)
Basophils Absolute: 0 10*3/uL (ref 0.0–0.1)
Basophils Relative: 0 %
Eosinophils Absolute: 0 10*3/uL (ref 0.0–0.5)
Eosinophils Relative: 1 %
HCT: 35.8 % — ABNORMAL LOW (ref 39.0–52.0)
Hemoglobin: 11.1 g/dL — ABNORMAL LOW (ref 13.0–17.0)
Immature Granulocytes: 0 %
Lymphocytes Relative: 15 %
Lymphs Abs: 0.9 10*3/uL (ref 0.7–4.0)
MCH: 26.2 pg (ref 26.0–34.0)
MCHC: 31 g/dL (ref 30.0–36.0)
MCV: 84.6 fL (ref 80.0–100.0)
Monocytes Absolute: 0.9 10*3/uL (ref 0.1–1.0)
Monocytes Relative: 14 %
Neutro Abs: 4.4 10*3/uL (ref 1.7–7.7)
Neutrophils Relative %: 70 %
Platelet Count: 366 10*3/uL (ref 150–400)
RBC: 4.23 MIL/uL (ref 4.22–5.81)
RDW: 18.7 % — ABNORMAL HIGH (ref 11.5–15.5)
WBC Count: 6.2 10*3/uL (ref 4.0–10.5)
nRBC: 0 % (ref 0.0–0.2)

## 2019-08-17 LAB — CMP (CANCER CENTER ONLY)
ALT: 8 U/L (ref 0–44)
AST: 18 U/L (ref 15–41)
Albumin: 2.9 g/dL — ABNORMAL LOW (ref 3.5–5.0)
Alkaline Phosphatase: 69 U/L (ref 38–126)
Anion gap: 8 (ref 5–15)
BUN: 20 mg/dL (ref 8–23)
CO2: 29 mmol/L (ref 22–32)
Calcium: 8.8 mg/dL — ABNORMAL LOW (ref 8.9–10.3)
Chloride: 107 mmol/L (ref 98–111)
Creatinine: 0.79 mg/dL (ref 0.61–1.24)
GFR, Est AFR Am: >60 mL/min (ref >60)
GFR, Estimated: >60 mL/min (ref >60)
Glucose, Bld: 96 mg/dL (ref 70–99)
Potassium: 4.3 mmol/L (ref 3.5–5.1)
Sodium: 144 mmol/L (ref 135–145)
Total Bilirubin: 0.4 mg/dL (ref 0.3–1.2)
Total Protein: 6.2 g/dL — ABNORMAL LOW (ref 6.5–8.1)

## 2019-08-17 MED ORDER — GABAPENTIN 300 MG PO CAPS: 300.0000 mg | ORAL_CAPSULE | Freq: Every day | ORAL | 3 refills | Status: AC

## 2019-08-17 MED ORDER — HEPARIN SOD (PORK) LOCK FLUSH 100 UNIT/ML IV SOLN
500.0000 [IU] | Freq: Once | INTRAVENOUS | Status: AC | PRN
  Administered 2019-08-17: 500 [IU]
  Filled 2019-08-17: qty 5

## 2019-08-17 MED ORDER — SODIUM CHLORIDE 0.9% FLUSH
10.0000 mL | INTRAVENOUS | Status: DC | PRN
  Administered 2019-08-17: 10 mL
  Filled 2019-08-17: qty 10

## 2019-08-17 MED ORDER — SODIUM CHLORIDE 0.9 % IV SOLN
Freq: Once | INTRAVENOUS | Status: AC
  Filled 2019-08-17: qty 250

## 2019-08-17 MED ORDER — SODIUM CHLORIDE 0.9 % IV SOLN
60.0000 mg/m2 | Freq: Once | INTRAVENOUS | Status: AC
  Administered 2019-08-17: 14:00:00 110 mg via INTRAVENOUS
  Filled 2019-08-17: qty 11

## 2019-08-17 MED ORDER — DEXAMETHASONE SODIUM PHOSPHATE 10 MG/ML IJ SOLN
10.0000 mg | Freq: Once | INTRAMUSCULAR | Status: AC
  Administered 2019-08-17: 10 mg via INTRAVENOUS

## 2019-08-17 MED ORDER — DEXAMETHASONE SODIUM PHOSPHATE 10 MG/ML IJ SOLN
INTRAMUSCULAR | Status: AC
  Filled 2019-08-17: qty 1

## 2019-08-17 NOTE — Progress Notes (Signed)
Hematology and Oncology Follow Up Visit  ANASTASIO BONSELL [id number] [birth year] 74 y.o. 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  He presented with Gleason score of 4+5 = 9 and advanced disease in 2001.   Prior Therapy: 1. He was started on Lupron after attempted prostatectomy in 2001.    2. 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  3. He is status post radiation therapy to the prostate fossa completed in December 2018. He received 52.5 gr 20 fractions.  4. Zytiga 1000 mg po daily with prednisone 5 mg started in 09/2011.   Current therapy:   Xgeva 120 mg started on 10/31/2011.  This will be given every 6 weeks with chemotherapy.  Lupron 30 mg every 4 months.  Next Eligard will be given in January 2021.   Taxotere chemotherapy 75 mg per metered square with cycle 1 started on 01/19/2019.  His dose was reduced to 60 mg per metered square starting with cycle 8 of therapy.  He is here for cycle 11.   Interim History: Mr. Dembek returns today for a repeat evaluation.  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  Is reporting pain at nighttime that keeps him up at night despite taking Cymbalta.  He does report some occasional numbness in his fingers but no interference with function.  Continues to be active and attends to activities of daily living.  Is able to ambulate and perform fine motor function in his upper and lower extremities.         Medications: Unchanged on review. Current Outpatient Medications  Medication Sig Dispense Refill  . albuterol (PROVENTIL HFA;VENTOLIN HFA) 108 (90 Base) MCG/ACT inhaler Inhale 1-2 puffs into the lungs every 6 (six) hours as needed for wheezing or shortness of breath. 1 Inhaler 0  . atorvastatin (LIPITOR) 20 MG tablet Take 1 tablet  (20 mg total) by mouth daily. 90 tablet 3  . denosumab (XGEVA) 120 MG/1.7ML SOLN Inject 120 mg into the skin every 30 (thirty) days.    . DULoxetine (CYMBALTA) 60 MG capsule Take 1 capsule (60 mg total) by mouth daily. 90 capsule 2  . EPIPEN 2-PAK 0.3 MG/0.3ML SOAJ injection Inject 0.3 mg into the muscle as needed for anaphylaxis.     . hydrochlorothiazide (HYDRODIURIL) 25 MG tablet Take 1 tablet (25 mg total) by mouth daily. 90 tablet 3  . leuprolide (LUPRON) 30 MG injection Inject 30 mg into the muscle every 4 (four) months.    . lidocaine-prilocaine (EMLA) cream Apply 1 application topically as needed. (Patient taking differently: Apply 1 application topically as needed (port pain). ) 30 g 0  . magnesium chloride (SLOW-MAG) 64 MG TBEC SR tablet Take 1 tablet (64 mg total) by mouth 2 (two) times daily. 20 tablet 0  . potassium chloride SA (KLOR-CON) 20 MEQ tablet Take 1 tablet (20 mEq total) by mouth 2 (two) times daily. 30 tablet 0  . predniSONE (DELTASONE) 5 MG tablet TAKE 1 TABLET (5 MG TOTAL) BY MOUTH 2 (TWO) TIMES DAILY. (Patient taking differently: Take 5 mg by mouth 2 (two) times daily with a meal. ) 90 tablet 3  . prochlorperazine (COMPAZINE) 10 MG tablet TAKE 1 TABLET BY MOUTH EVERY 6 HOURS AS NEEDED FOR NAUSEA FOR VOMITING 30 tablet 3  . rOPINIRole (REQUIP) 2 MG tablet Take 1  tablet (2 mg total) by mouth at bedtime. 90 tablet 2   No current facility-administered medications for this visit.     Allergies:  Allergies  Allergen Reactions  . Bee Venom Anaphylaxis, Shortness Of Breath and Swelling    Tongue swelling.   . Wasp Venom Anaphylaxis, Shortness Of Breath and Swelling    Tongue swelling   . Percocet [Oxycodone-Acetaminophen] Palpitations       Physical Exam:    Blood pressure 127/80, pulse 86, temperature 98.9 F (37.2 C), temperature source Temporal, resp. rate 17, height 5\' 5"  (1.651 m), weight 162 lb 14.4 oz (73.9 kg), SpO2 96 %.       ECOG: 1      General appearance: Alert, awake without any distress. Head: Atraumatic without abnormalities Oropharynx: Without any thrush or ulcers. Eyes: No scleral icterus. Lymph nodes: No lymphadenopathy noted in the cervical, supraclavicular, or axillary nodes Heart:regular rate and rhythm, without any murmurs or gallops.   Lung: Clear to auscultation without any rhonchi, wheezes or dullness to percussion. Abdomin: Soft, nontender without any shifting dullness or ascites. Musculoskeletal: No clubbing or cyanosis. Neurological: No motor or sensory deficits. Skin: No rashes or lesions.               Lab Results: Lab Results  Component Value Date   WBC 6.2 08/17/2019   HGB 11.1 (L) 08/17/2019   HCT 35.8 (L) 08/17/2019   MCV 84.6 08/17/2019   PLT 366 08/17/2019     Chemistry      Component Value Date/Time   NA 142 07/27/2019 0838   NA 141 05/20/2017 1425   K 4.0 07/27/2019 0838   K 4.3 05/20/2017 1425   CL 106 07/27/2019 0838   CL 105 12/08/2012 1504   CO2 28 07/27/2019 0838   CO2 31 (H) 05/20/2017 1425   BUN 15 07/27/2019 0838   BUN 17.8 05/20/2017 1425   CREATININE 0.78 07/27/2019 0838   CREATININE 0.9 05/20/2017 1425      Component Value Date/Time   CALCIUM 8.5 (L) 07/27/2019 0838   CALCIUM 9.4 05/20/2017 1425   ALKPHOS 69 07/27/2019 0838   ALKPHOS 72 05/20/2017 1425   AST 17 07/27/2019 0838   AST 15 05/20/2017 1425   ALT 7 07/27/2019 0838   ALT <6 05/20/2017 1425   BILITOT 0.3 07/27/2019 0838   BILITOT 0.25 05/20/2017 1425      Results for PERSHING, BONNICI (MRN [id number]) as of 08/17/2019 12:16  Ref. Range 07/06/2019 09:30 07/27/2019 08:38  Prostate Specific Ag, Serum Latest Ref Range: 0.0 - 4.0 ng/mL 50.9 (H) 47.3 (H)             Impression and Plan:   74 year old man with:    1.  Castration-resistant prostate cancer with disease to the bone diagnosed in 2013.   He has tolerated Taxotere chemotherapy without any major complications although  he is experiencing more sensory neuropathy in his lower extremities.  He is describing pain that is interfering with his sleep.  Risks and benefits of continuing chemotherapy were discussed today.  His PSA continues to show reasonable decline although approaching a plateau.  Despite his neuropathy he prefers to continue with chemotherapy given the fact that he is overall managing toxicities.  The plan is to continue with the same dose and schedule with low threshold of discontinuation of any monitor i neuropathy is detected.    2. Bone directed therapy: He continues to receive Xgeva every 6 weeks.  Last injection was  given on February 3 and will be repeated in March.  3. Androgen deprivation: Risks and benefits of continuing this treatment were reviewed which includes osteoporosis and hot flashes among others.  Next injection will be tentatively in May 2021.   5.  IV access: Port-A-Cath will continue to be in use without any issues.  6.  Antiemetics: Compazine is available to him.  No nausea or vomiting reported at this time.  7.  Growth factor support: He is at risk of neutropenia and sepsis and will receive growth factor support after each cycle.  8.  Sensory neuropathy: Risks and benefits of starting gabapentin was reviewed.  He is already on Cymbalta which seems to has not helped his symptoms.  He was prescribed this for restless leg syndrome.  I asked him to discontinue Cymbalta for the time being and start Neurontin 300 mg at nighttime.  9.  Followup: In 3 weeks for repeat evaluation.   30  minutes were spent on this encounter.  The time was dedicated to updating his disease status, treatment options and managing complications lytic therapy.   Zola Button, MD 2/24/202112:27 PM

## 2019-08-17 NOTE — Patient Instructions (Signed)
[facility] Discharge Instructions for Patients Receiving Chemotherapy  Today you received the following chemotherapy agents: Taxotere  To help prevent nausea and vomiting after your treatment, we encourage you to take your nausea medication as directed.    If you develop nausea and vomiting that is not controlled by your nausea medication, call the clinic.   BELOW ARE SYMPTOMS THAT SHOULD BE REPORTED IMMEDIATELY:  *FEVER GREATER THAN 100.5 F  *CHILLS WITH OR WITHOUT FEVER  NAUSEA AND VOMITING THAT IS NOT CONTROLLED WITH YOUR NAUSEA MEDICATION  *UNUSUAL SHORTNESS OF BREATH  *UNUSUAL BRUISING OR BLEEDING  TENDERNESS IN MOUTH AND THROAT WITH OR WITHOUT PRESENCE OF ULCERS  *URINARY PROBLEMS  *BOWEL PROBLEMS  UNUSUAL RASH Items with * indicate a potential emergency and should be followed up as soon as possible.  Feel free to call the clinic should you have any questions or concerns. The clinic phone number is [phone].  Please show the CHEMO ALERT CARD at check-in to the Emergency Department and triage nurse.   

## 2019-08-18 ENCOUNTER — Ambulatory Visit: Payer: Medicare Other

## 2019-08-18 ENCOUNTER — Telehealth: Payer: Self-pay | Admitting: Oncology

## 2019-08-18 ENCOUNTER — Telehealth: Payer: Self-pay

## 2019-08-18 LAB — PROSTATE-SPECIFIC AG, SERUM (LABCORP): Prostate Specific Ag, Serum: 54 ng/mL — ABNORMAL HIGH (ref 0.0–4.0)

## 2019-08-18 NOTE — Telephone Encounter (Signed)
-----   Message from [name], MD sent at 08/18/2019  8:04 AM EST ----- Please let him know his PSA is slightly up. No changes for now.

## 2019-08-18 NOTE — Telephone Encounter (Signed)
Called patient and let him know PSA result. Patient verbalized understanding.  

## 2019-08-18 NOTE — Telephone Encounter (Signed)
Scheduled appt per 2/24 los.

## 2019-08-19 ENCOUNTER — Other Ambulatory Visit: Payer: Self-pay

## 2019-08-19 ENCOUNTER — Inpatient Hospital Stay: Payer: Medicare Other

## 2019-08-19 VITALS — BP 140/84 | HR 88 | Temp 98.2°F | Resp 18

## 2019-08-19 DIAGNOSIS — C61 Malignant neoplasm of prostate: Secondary | ICD-10-CM

## 2019-08-19 DIAGNOSIS — Z5111 Encounter for antineoplastic chemotherapy: Secondary | ICD-10-CM | POA: Diagnosis not present

## 2019-08-19 MED ORDER — PEGFILGRASTIM-JMDB 6 MG/0.6ML ~~LOC~~ SOSY
PREFILLED_SYRINGE | SUBCUTANEOUS | Status: AC
  Filled 2019-08-19: qty 0.6

## 2019-08-19 MED ORDER — PEGFILGRASTIM-JMDB 6 MG/0.6ML ~~LOC~~ SOSY
6.0000 mg | PREFILLED_SYRINGE | Freq: Once | SUBCUTANEOUS | Status: AC
  Administered 2019-08-19: 10:00:00 6 mg via SUBCUTANEOUS

## 2019-08-19 NOTE — Patient Instructions (Signed)

## 2019-08-21 ENCOUNTER — Ambulatory Visit: Payer: Medicare Other | Attending: Internal Medicine

## 2019-08-21 DIAGNOSIS — Z23 Encounter for immunization: Secondary | ICD-10-CM | POA: Insufficient documentation

## 2019-08-21 NOTE — Progress Notes (Signed)
   Covid-19 Vaccination Clinic  Name:  [name] Mathews    MRN: [id number] DOB: Nov 16, 1945  08/21/2019  [name] was observed post Covid-19 immunization for 15 minutes without incidence. He was provided with Vaccine Information Sheet and instruction to access the V-Safe system.   [name] was instructed to call 911 with any severe reactions post vaccine: [name] Difficulty breathing  . Swelling of your face and throat  . A fast heartbeat  . A bad rash all over your body  . Dizziness and weakness    Immunizations Administered    Name Date Dose VIS Date Route   Pfizer COVID-19 Vaccine 08/21/2019 11:37 AM 0.3 mL 06/03/2019 Intramuscular   Manufacturer: [place]   Lot: [id number]   [place]: [id number]

## 2019-08-22 ENCOUNTER — Other Ambulatory Visit: Payer: Self-pay | Admitting: *Deleted

## 2019-08-22 DIAGNOSIS — I10 Essential (primary) hypertension: Secondary | ICD-10-CM

## 2019-08-22 MED ORDER — HYDROCHLOROTHIAZIDE 25 MG PO TABS: 25.0000 mg | ORAL_TABLET | Freq: Every day | ORAL | 3 refills | Status: DC

## 2019-09-07 ENCOUNTER — Other Ambulatory Visit: Payer: Self-pay

## 2019-09-07 ENCOUNTER — Inpatient Hospital Stay: Payer: Medicare Other

## 2019-09-07 ENCOUNTER — Encounter: Payer: Self-pay | Admitting: Oncology

## 2019-09-07 ENCOUNTER — Inpatient Hospital Stay: Payer: Medicare Other | Attending: Oncology | Admitting: Oncology

## 2019-09-07 VITALS — BP 136/83 | HR 86 | Temp 98.3°F | Resp 18 | Ht 65.0 in

## 2019-09-07 DIAGNOSIS — C61 Malignant neoplasm of prostate: Secondary | ICD-10-CM

## 2019-09-07 DIAGNOSIS — Z95828 Presence of other vascular implants and grafts: Secondary | ICD-10-CM

## 2019-09-07 DIAGNOSIS — G629 Polyneuropathy, unspecified: Secondary | ICD-10-CM | POA: Insufficient documentation

## 2019-09-07 DIAGNOSIS — C7951 Secondary malignant neoplasm of bone: Secondary | ICD-10-CM | POA: Diagnosis not present

## 2019-09-07 LAB — CMP (CANCER CENTER ONLY)
ALT: 7 U/L (ref 0–44)
AST: 22 U/L (ref 15–41)
Albumin: 2.8 g/dL — ABNORMAL LOW (ref 3.5–5.0)
Alkaline Phosphatase: 63 U/L (ref 38–126)
Anion gap: 10 (ref 5–15)
BUN: 18 mg/dL (ref 8–23)
CO2: 26 mmol/L (ref 22–32)
Calcium: 8.9 mg/dL (ref 8.9–10.3)
Chloride: 107 mmol/L (ref 98–111)
Creatinine: 0.76 mg/dL (ref 0.61–1.24)
GFR, Est AFR Am: >60 mL/min (ref >60)
GFR, Estimated: >60 mL/min (ref >60)
Glucose, Bld: 98 mg/dL (ref 70–99)
Potassium: 4.1 mmol/L (ref 3.5–5.1)
Sodium: 143 mmol/L (ref 135–145)
Total Bilirubin: 0.4 mg/dL (ref 0.3–1.2)
Total Protein: 6.2 g/dL — ABNORMAL LOW (ref 6.5–8.1)

## 2019-09-07 LAB — CBC WITH DIFFERENTIAL (CANCER CENTER ONLY)
Abs Immature Granulocytes: 0.01 10*3/uL (ref 0.00–0.07)
Basophils Absolute: 0 10*3/uL (ref 0.0–0.1)
Basophils Relative: 0 %
Eosinophils Absolute: 0 10*3/uL (ref 0.0–0.5)
Eosinophils Relative: 0 %
HCT: 36 % — ABNORMAL LOW (ref 39.0–52.0)
Hemoglobin: 11 g/dL — ABNORMAL LOW (ref 13.0–17.0)
Immature Granulocytes: 0 %
Lymphocytes Relative: 12 %
Lymphs Abs: 1 10*3/uL (ref 0.7–4.0)
MCH: 25.7 pg — ABNORMAL LOW (ref 26.0–34.0)
MCHC: 30.6 g/dL (ref 30.0–36.0)
MCV: 84.1 fL (ref 80.0–100.0)
Monocytes Absolute: 0.8 10*3/uL (ref 0.1–1.0)
Monocytes Relative: 10 %
Neutro Abs: 6.3 10*3/uL (ref 1.7–7.7)
Neutrophils Relative %: 78 %
Platelet Count: 347 10*3/uL (ref 150–400)
RBC: 4.28 MIL/uL (ref 4.22–5.81)
RDW: 18.9 % — ABNORMAL HIGH (ref 11.5–15.5)
WBC Count: 8.2 10*3/uL (ref 4.0–10.5)
nRBC: 0 % (ref 0.0–0.2)

## 2019-09-07 MED ORDER — SODIUM CHLORIDE 0.9% FLUSH
10.0000 mL | INTRAVENOUS | Status: AC | PRN
  Administered 2019-09-07: 10 mL
  Filled 2019-09-07: qty 10

## 2019-09-07 MED ORDER — HEPARIN SOD (PORK) LOCK FLUSH 100 UNIT/ML IV SOLN
500.0000 [IU] | Freq: Once | INTRAVENOUS | Status: AC | PRN
  Administered 2019-09-07: 500 [IU]
  Filled 2019-09-07: qty 5

## 2019-09-07 MED ORDER — SODIUM CHLORIDE 0.9% FLUSH
10.0000 mL | INTRAVENOUS | Status: DC | PRN
  Administered 2019-09-07: 10 mL
  Filled 2019-09-07: qty 10

## 2019-09-07 NOTE — Addendum Note (Signed)
Addended by: [name] on: 09/07/2019 09:49 AM   Modules accepted: Orders

## 2019-09-07 NOTE — Addendum Note (Signed)
Addended by: Teodoro Spray on: 09/07/2019 09:55 AM   Modules accepted: Orders

## 2019-09-07 NOTE — Progress Notes (Signed)
Hematology and Oncology Follow Up Visit  [name] Mathews [id number] December 15, 1945 75 y.o. 09/07/2019 8:53 AM    Principle Diagnosis: 74 year old man with advanced prostate cancer with disease to the bone since 2013.  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    Prior Therapy: 1. He was started on Lupron after attempted prostatectomy in 2001.    2. 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  3. He is status post radiation therapy to the prostate fossa completed in December 2018. He received 52.5 gr 20 fractions.  4. Zytiga 1000 mg po daily with prednisone 5 mg started in 09/2011.   Current therapy:   Xgeva 120 mg started on 10/31/2011.  This will be given every 6 weeks with chemotherapy.  Lupron 30 mg every 4 months.  Next Eligard will be given in January 2021.   Taxotere chemotherapy 75 mg per metered square with cycle 1 started on 01/19/2019.  His dose was reduced to 60 mg per metered square starting with cycle 8 of therapy.  He is here for cycle 12.   Interim History: [name] presents today for a follow-up.  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 He is ambulating without any issues unable to attend to activities of daily living. He denies any nausea vomiting or weight loss. He denies any recent hospitalization or illnesses.         Medications: Without any changes on review. Current Outpatient Medications  Medication Sig Dispense Refill  . albuterol (PROVENTIL HFA;VENTOLIN HFA) 108 (90 Base) MCG/ACT inhaler Inhale 1-2 puffs into the lungs every 6 (six) hours as needed for wheezing or shortness of breath. 1 Inhaler 0  . atorvastatin (LIPITOR) 20 MG tablet Take 1 tablet (20 mg total) by mouth daily. 90 tablet 3  . denosumab (XGEVA) 120 MG/1.7ML SOLN Inject 120 mg into the skin every  30 (thirty) days.    . DULoxetine (CYMBALTA) 60 MG capsule Take 1 capsule (60 mg total) by mouth daily. 90 capsule 2  . EPIPEN 2-PAK 0.3 MG/0.3ML SOAJ injection Inject 0.3 mg into the muscle as needed for anaphylaxis.     [name] gabapentin (NEURONTIN) 300 MG capsule Take 1 capsule (300 mg total) by mouth at bedtime. 90 capsule 3  . hydrochlorothiazide (HYDRODIURIL) 25 MG tablet Take 1 tablet (25 mg total) by mouth daily. 90 tablet 3  . leuprolide (LUPRON) 30 MG injection Inject 30 mg into the muscle every 4 (four) months.    . lidocaine-prilocaine (EMLA) cream Apply 1 application topically as needed. (Patient taking differently: Apply 1 application topically as needed (port pain). ) 30 g 0  . magnesium chloride (SLOW-MAG) 64 MG TBEC SR tablet Take 1 tablet (64 mg total) by mouth 2 (two) times daily. 20 tablet 0  . potassium chloride SA (KLOR-CON) 20 MEQ tablet Take 1 tablet (20 mEq total) by mouth 2 (two) times daily. 30 tablet 0  . predniSONE (DELTASONE) 5 MG tablet TAKE 1 TABLET (5 MG TOTAL) BY MOUTH 2 (TWO) TIMES DAILY. (Patient taking differently: Take 5 mg by mouth 2 (two) times daily with a meal. ) 90 tablet 3  . prochlorperazine (COMPAZINE) 10 MG tablet TAKE 1 TABLET BY MOUTH EVERY 6 HOURS AS NEEDED FOR NAUSEA FOR VOMITING 30 tablet 3  . rOPINIRole (REQUIP) 2 MG tablet Take  1 tablet (2 mg total) by mouth at bedtime. 90 tablet 2   No current facility-administered medications for this visit.   Facility-Administered Medications Ordered in Other Visits  Medication Dose Route Frequency Provider Last Rate Last Admin  . sodium chloride flush (NS) 0.9 % injection 10 mL  10 mL Intracatheter PRN [name], MD   10 mL at 09/07/19 0843     Allergies:  Allergies  Allergen Reactions  . Bee Venom Anaphylaxis, Shortness Of Breath and Swelling    Tongue swelling.   . Wasp Venom Anaphylaxis, Shortness Of Breath and Swelling    Tongue swelling   . Percocet [Oxycodone-Acetaminophen] Palpitations        Physical Exam:       Blood pressure 136/83, pulse 86, temperature 98.3 F (36.8 C), resp. rate 18, height 5\' 5"  (1.651 m), SpO2 97 %.     ECOG: 1     General appearance: Comfortable appearing without any discomfort Head: Normocephalic without any trauma Oropharynx: Mucous membranes are moist and pink without any thrush or ulcers. Eyes: Pupils are equal and round reactive to light. Lymph nodes: No cervical, supraclavicular, inguinal or axillary lymphadenopathy.   Heart:regular rate and rhythm.  S1 and S2 without leg edema. Lung: Clear without any rhonchi or wheezes.  No dullness to percussion. Abdomin: Soft, nontender, nondistended with good bowel sounds.  No hepatosplenomegaly. Musculoskeletal: No joint deformity or effusion.  Full range of motion noted. Neurological: No deficits noted on motor, sensory and deep tendon reflex exam. Skin: No petechial rash or dryness.  Appeared moist.  .               Lab Results: Lab Results  Component Value Date   WBC 6.2 08/17/2019   HGB 11.1 (L) 08/17/2019   HCT 35.8 (L) 08/17/2019   MCV 84.6 08/17/2019   PLT 366 08/17/2019     Chemistry      Component Value Date/Time   NA 144 08/17/2019 1205   NA 141 05/20/2017 1425   K 4.3 08/17/2019 1205   K 4.3 05/20/2017 1425   CL 107 08/17/2019 1205   CL 105 12/08/2012 1504   CO2 29 08/17/2019 1205   CO2 31 (H) 05/20/2017 1425   BUN 20 08/17/2019 1205   BUN 17.8 05/20/2017 1425   CREATININE 0.79 08/17/2019 1205   CREATININE 0.9 05/20/2017 1425      Component Value Date/Time   CALCIUM 8.8 (L) 08/17/2019 1205   CALCIUM 9.4 05/20/2017 1425   ALKPHOS 69 08/17/2019 1205   ALKPHOS 72 05/20/2017 1425   AST 18 08/17/2019 1205   AST 15 05/20/2017 1425   ALT 8 08/17/2019 1205   ALT <6 05/20/2017 1425   BILITOT 0.4 08/17/2019 1205   BILITOT 0.25 05/20/2017 1425      Results for CORINTHIANS, REGEN (MRN [id number]) as of 09/07/2019 08:54  Ref. Range 07/06/2019 09:30  07/27/2019 08:38 08/17/2019 12:05  Prostate Specific Ag, Serum Latest Ref Range: 0.0 - 4.0 ng/mL 50.9 (H) 47.3 (H) 54.0 (H)              Impression and Plan:   74 year old man with:    1.  Advanced prostate cancer with disease to the bone diagnosed in 2013.  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  He remains on Taxotere chemotherapy with reasonable tolerance and PSA response.  Last PSA is showing slight increase which could indicate a plateau response to Taxotere.  Risks and benefits of continuing this therapy  versus discontinuation were reviewed today.  Alternative treatment options were discussed.  This will include Xofigo or a PARP inhibitor.  After discussion today, I have recommended discontinuation of Taxotere chemotherapy and proceed with a treatment break. We will sent tumor analysis for guardant 360 and possible PARP inhibitor after chemotherapy.  2. Bone directed therapy: He continues to be on Xgeva which I recommended continuing every 6 weeks.  He will receive Delton See today he is agreeable.  Long-term complications like osteonecrosis of jaw and hypocalcemia were reiterated.  3. Androgen deprivation: He will receive Eligard after Nov 03, 2019.  Long-term complications with hot flashes and weight gain were reiterated.   5.  IV access: Port-A-Cath remains in use without any issues.  6.  Antiemetics: No nausea or vomiting reported at this time.  7.  Growth factor support: He will receive growth factor support after each cycle given his risk of neutropenia.  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  9.  Followup: We will be in 3 weeks for repeat evaluation.   30  minutes were dedicated to the visit.  The time was spent on reviewing his laboratory data, disease status update, treatment options and addressing future plan care.   Zola Button, MD 3/17/20218:53 AM

## 2019-09-08 ENCOUNTER — Telehealth: Payer: Self-pay

## 2019-09-08 LAB — PROSTATE-SPECIFIC AG, SERUM (LABCORP): Prostate Specific Ag, Serum: 48.9 ng/mL — ABNORMAL HIGH (ref 0.0–4.0)

## 2019-09-08 NOTE — Telephone Encounter (Signed)
Called and informed patient his PSA level is down. Patient verbalized understanding.

## 2019-09-08 NOTE — Telephone Encounter (Signed)
-----   Message from [name], MD sent at 09/08/2019  8:23 AM EDT ----- Please let him know his PSA is down.

## 2019-09-09 ENCOUNTER — Ambulatory Visit: Payer: Medicare Other

## 2019-09-16 ENCOUNTER — Telehealth (INDEPENDENT_AMBULATORY_CARE_PROVIDER_SITE_OTHER): Payer: Medicare Other | Admitting: Family Medicine

## 2019-09-16 ENCOUNTER — Other Ambulatory Visit: Payer: Self-pay

## 2019-09-16 DIAGNOSIS — J302 Other seasonal allergic rhinitis: Secondary | ICD-10-CM

## 2019-09-16 DIAGNOSIS — G4733 Obstructive sleep apnea (adult) (pediatric): Secondary | ICD-10-CM

## 2019-09-16 NOTE — Progress Notes (Signed)
Trainer Telemedicine Visit  Patient consented to have virtual visit. Method of visit: Telephone  Encounter participants: Patient: [name] Mathews - located at home Provider: Guadalupe Dawn - located at [facility]  Chief Complaint: Broken piece on his CPAP machine  HPI: 74 year old gentleman who presents as a telemedicine visit for a replacement part for his CPAP machine.  He states that the connector where the tubing exits the machine has a loose connection and he has been noticing were suctioned with his machine recently.  Of note the patient has had the same CPAP machine for around 15 years at this point.  He is requesting a new machine and an updated sleep study.  The patient has had some mild allergic type symptoms.  He states that he has had rhinorrhea, mild eye watering, and a very mild cough at night.  He has been using Flonase 1 puff each nostril once per day.  He has not tried anything else.  ROS: per HPI  Pertinent PMHx: Metastatic prostate cancer  Exam:  General: Very pleasant male, no acute distress, able speak in clear coherent sentences Respiratory: No respiratory distress, able to converse with no shortness of breath  Assessment/Plan:  Obstructive sleep apnea Patient with long-term obstructive sleep apnea presenting for replacement part for his CPAP machine which is broken.  I did place a DME order for replacement part, he is let me know if he does not receive this in a timely fashion.  Given how long it has been since his sleep study and his desire to have another sleep study I will place a referral to sleep medicine, which will allow him to get another replacement CPAP machine.  Seasonal allergies Patient with seasonal allergies.  Has been taking Flonase 1 puff each nostril daily.  I advised him to increase this to 2 puffs each nostril daily.  He is going try this and if his symptoms do not improve we can think about adding on a second generation  histamine blocker given his age.  He will let me know if his symptoms do not improve with the increase Flonase.    Time spent during visit with patient: 11 minutes

## 2019-09-18 DIAGNOSIS — J302 Other seasonal allergic rhinitis: Secondary | ICD-10-CM | POA: Insufficient documentation

## 2019-09-18 DIAGNOSIS — G4733 Obstructive sleep apnea (adult) (pediatric): Secondary | ICD-10-CM | POA: Insufficient documentation

## 2019-09-18 NOTE — Assessment & Plan Note (Signed)
Patient with seasonal allergies.  Has been taking Flonase 1 puff each nostril daily.  I advised him to increase this to 2 puffs each nostril daily.  He is going try this and if his symptoms do not improve we can think about adding on a second generation histamine blocker given his age.  He will let me know if his symptoms do not improve with the increase Flonase.

## 2019-09-18 NOTE — Assessment & Plan Note (Signed)
Patient with long-term obstructive sleep apnea presenting for replacement part for his CPAP machine which is broken.  I did place a DME order for replacement part, he is let me know if he does not receive this in a timely fashion.  Given how long it has been since his sleep study and his desire to have another sleep study I will place a referral to sleep medicine, which will allow him to get another replacement CPAP machine.

## 2019-09-20 ENCOUNTER — Ambulatory Visit: Payer: Medicare Other | Attending: Internal Medicine

## 2019-09-20 DIAGNOSIS — Z23 Encounter for immunization: Secondary | ICD-10-CM

## 2019-09-20 NOTE — Progress Notes (Signed)
   Covid-19 Vaccination Clinic  Name:  [name] Mathews    MRN: [id number] DOB: May 06, 1946  09/20/2019  [name] Mathews was observed post Covid-19 immunization for 30 minutes based on pre-vaccination screening without incident. He was provided with Vaccine Information Sheet and instruction to access the V-Safe system.   [name] Mathews was instructed to call 911 with any severe reactions post vaccine: [name] Difficulty breathing  . Swelling of face and throat  . A fast heartbeat  . A bad rash all over body  . Dizziness and weakness   Immunizations Administered    Name Date Dose VIS Date Route   Pfizer COVID-19 Vaccine 09/20/2019 10:11 AM 0.3 mL 06/03/2019 Intramuscular   Manufacturer: [place]   Lot: [id number]   [place]: [id number]

## 2019-09-22 NOTE — Progress Notes (Signed)
.  Pharmacist Chemotherapy Monitoring - Follow Up Assessment    I verify that I have reviewed each item in the below checklist:  . Regimen for the patient is scheduled for the appropriate day and plan matches scheduled date. [name] Appropriate non-routine labs are ordered dependent on drug ordered. . If applicable, additional medications reviewed and ordered per protocol based on lifetime cumulative doses and/or treatment regimen.   Plan for follow-up and/or issues identified: Yes . I-vent associated with next due treatment: Yes . MD and/or nursing notified: No  Wynona Neat 09/22/2019 10:19 AM

## 2019-09-26 NOTE — Progress Notes (Signed)
Pharmacist Chemotherapy Monitoring - Follow Up Assessment    I verify that I have reviewed each item in the below checklist:  . Regimen for the patient is scheduled for the appropriate day and plan matches scheduled date. [name] Appropriate non-routine labs are ordered dependent on drug ordered. . If applicable, additional medications reviewed and ordered per protocol based on lifetime cumulative doses and/or treatment regimen.   Plan for follow-up and/or issues identified: No . I-vent associated with next due treatment: No . MD and/or nursing notified: No  [name] Mathews [facility] 09/26/2019 1:18 PM

## 2019-09-28 ENCOUNTER — Inpatient Hospital Stay: Payer: Medicare Other

## 2019-09-28 ENCOUNTER — Telehealth: Payer: Self-pay | Admitting: Oncology

## 2019-09-28 ENCOUNTER — Inpatient Hospital Stay: Payer: Medicare Other | Admitting: Oncology

## 2019-09-28 ENCOUNTER — Other Ambulatory Visit: Payer: Self-pay

## 2019-09-28 ENCOUNTER — Inpatient Hospital Stay: Payer: Medicare Other | Attending: Oncology

## 2019-09-28 VITALS — BP 162/95 | HR 88 | Temp 98.2°F | Resp 20 | Ht 65.0 in | Wt 158.4 lb

## 2019-09-28 DIAGNOSIS — R634 Abnormal weight loss: Secondary | ICD-10-CM | POA: Insufficient documentation

## 2019-09-28 DIAGNOSIS — R630 Anorexia: Secondary | ICD-10-CM | POA: Diagnosis not present

## 2019-09-28 DIAGNOSIS — R0609 Other forms of dyspnea: Secondary | ICD-10-CM | POA: Diagnosis not present

## 2019-09-28 DIAGNOSIS — C61 Malignant neoplasm of prostate: Secondary | ICD-10-CM

## 2019-09-28 DIAGNOSIS — C7800 Secondary malignant neoplasm of unspecified lung: Secondary | ICD-10-CM | POA: Diagnosis not present

## 2019-09-28 DIAGNOSIS — C7951 Secondary malignant neoplasm of bone: Secondary | ICD-10-CM | POA: Insufficient documentation

## 2019-09-28 DIAGNOSIS — Z95828 Presence of other vascular implants and grafts: Secondary | ICD-10-CM

## 2019-09-28 DIAGNOSIS — M545 Low back pain: Secondary | ICD-10-CM | POA: Insufficient documentation

## 2019-09-28 DIAGNOSIS — R109 Unspecified abdominal pain: Secondary | ICD-10-CM | POA: Diagnosis not present

## 2019-09-28 LAB — CMP (CANCER CENTER ONLY)
ALT: 9 U/L (ref 0–44)
AST: 22 U/L (ref 15–41)
Albumin: 2.8 g/dL — ABNORMAL LOW (ref 3.5–5.0)
Alkaline Phosphatase: 79 U/L (ref 38–126)
Anion gap: 12 (ref 5–15)
BUN: 14 mg/dL (ref 8–23)
CO2: 25 mmol/L (ref 22–32)
Calcium: 8.9 mg/dL (ref 8.9–10.3)
Chloride: 107 mmol/L (ref 98–111)
Creatinine: 0.78 mg/dL (ref 0.61–1.24)
GFR, Est AFR Am: >60 mL/min (ref >60)
GFR, Estimated: >60 mL/min (ref >60)
Glucose, Bld: 98 mg/dL (ref 70–99)
Potassium: 3.8 mmol/L (ref 3.5–5.1)
Sodium: 144 mmol/L (ref 135–145)
Total Bilirubin: <0.2 mg/dL — ABNORMAL LOW (ref 0.3–1.2)
Total Protein: 6.7 g/dL (ref 6.5–8.1)

## 2019-09-28 LAB — CBC WITH DIFFERENTIAL (CANCER CENTER ONLY)
Abs Immature Granulocytes: 0.01 10*3/uL (ref 0.00–0.07)
Basophils Absolute: 0 10*3/uL (ref 0.0–0.1)
Basophils Relative: 0 %
Eosinophils Absolute: 0.2 10*3/uL (ref 0.0–0.5)
Eosinophils Relative: 3 %
HCT: 35.7 % — ABNORMAL LOW (ref 39.0–52.0)
Hemoglobin: 10.8 g/dL — ABNORMAL LOW (ref 13.0–17.0)
Immature Granulocytes: 0 %
Lymphocytes Relative: 16 %
Lymphs Abs: 0.9 10*3/uL (ref 0.7–4.0)
MCH: 24.9 pg — ABNORMAL LOW (ref 26.0–34.0)
MCHC: 30.3 g/dL (ref 30.0–36.0)
MCV: 82.3 fL (ref 80.0–100.0)
Monocytes Absolute: 0.4 10*3/uL (ref 0.1–1.0)
Monocytes Relative: 7 %
Neutro Abs: 4.1 10*3/uL (ref 1.7–7.7)
Neutrophils Relative %: 74 %
Platelet Count: 272 10*3/uL (ref 150–400)
RBC: 4.34 MIL/uL (ref 4.22–5.81)
RDW: 17.8 % — ABNORMAL HIGH (ref 11.5–15.5)
WBC Count: 5.6 10*3/uL (ref 4.0–10.5)
nRBC: 0 % (ref 0.0–0.2)

## 2019-09-28 MED ORDER — DENOSUMAB 120 MG/1.7ML ~~LOC~~ SOLN
120.0000 mg | Freq: Once | SUBCUTANEOUS | Status: AC
  Administered 2019-09-28: 120 mg via SUBCUTANEOUS

## 2019-09-28 MED ORDER — MORPHINE SULFATE (PF) 4 MG/ML IV SOLN
INTRAVENOUS | Status: AC
  Filled 2019-09-28: qty 1

## 2019-09-28 MED ORDER — SODIUM CHLORIDE 0.9 % IV SOLN
Freq: Once | INTRAVENOUS | Status: AC
  Filled 2019-09-28: qty 250

## 2019-09-28 MED ORDER — MORPHINE SULFATE 4 MG/ML IJ SOLN
1.0000 mg | Freq: Once | INTRAMUSCULAR | Status: AC
  Administered 2019-09-28: 1 mg via INTRAVENOUS
  Filled 2019-09-28: qty 1

## 2019-09-28 MED ORDER — SODIUM CHLORIDE 0.9% FLUSH
10.0000 mL | INTRAVENOUS | Status: DC | PRN
  Administered 2019-09-28: 10 mL
  Filled 2019-09-28: qty 10

## 2019-09-28 MED ORDER — MEGESTROL ACETATE 400 MG/10ML PO SUSP: 400.0000 mg | Freq: Every day | ORAL | 0 refills | Status: DC

## 2019-09-28 MED ORDER — HEPARIN SOD (PORK) LOCK FLUSH 100 UNIT/ML IV SOLN
500.0000 [IU] | Freq: Once | INTRAVENOUS | Status: AC | PRN
  Administered 2019-09-28: 500 [IU]
  Filled 2019-09-28: qty 5

## 2019-09-28 MED ORDER — DENOSUMAB 120 MG/1.7ML ~~LOC~~ SOLN
SUBCUTANEOUS | Status: AC
  Filled 2019-09-28: qty 1.7

## 2019-09-28 MED ORDER — HYDROCODONE-ACETAMINOPHEN 5-325 MG PO TABS: 1.0000 | ORAL_TABLET | Freq: Four times a day (QID) | ORAL | 0 refills | Status: DC | PRN

## 2019-09-28 MED FILL — Dexamethasone Sodium Phosphate Inj 100 MG/10ML: INTRAMUSCULAR | Qty: 1 | Status: AC

## 2019-09-28 NOTE — Telephone Encounter (Signed)
Scheduled appt per 4/7 los.  Left a VM of the appt date and time.

## 2019-09-28 NOTE — Patient Instructions (Signed)

## 2019-09-28 NOTE — Progress Notes (Signed)
Hematology and Oncology Follow Up Visit  DEAUNTA TERRASI [id number] 1945-07-03 74 y.o. 09/28/2019 9:28 AM    Principle Diagnosis: 74 year old man with castration-resistant prostate cancer with disease to the bone documented in 2013.  He was diagnosed with Gleason score of 4+5 = 9 prostate cancer 2001.    Prior Therapy: 1. He was started on Lupron after attempted prostatectomy in 2001.    2. 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  3. He is status post radiation therapy to the prostate fossa completed in December 2018. He received 52.5 gr 20 fractions.  4. Zytiga 1000 mg po daily with prednisone 5 mg started in 09/2011.   5. Taxotere chemotherapy 75 mg per metered square with cycle 1 started on 01/19/2019.  His dose was reduced to 60 mg per metered square starting with cycle 8 of therapy.  He is status post 11 cycles of therapy.  Current therapy:   Xgeva 120 mg started on 10/31/2011.  This will be given every 6 weeks with chemotherapy.  Lupron 30 mg every 4 months.  Next Eligard will be given in May 2021.     Interim History: Mr. Son returns today for a follow-up visit.  Since the last visit, he is reporting more health decline and residual complications related to chemotherapy.  He is reporting some fatigue tiredness as well as increased lower and flank pain.  He is having difficulty sleeping and has reported decrease in his appetite.  He has lost some weight at this time with decrease in his p.o. intake.  Despite that he was still able to drive and attends to some activities of daily living.  He denies any cough or shortness of breath but does report dyspnea on exertion.         Medications: Reviewed without changes. Current Outpatient Medications  Medication Sig Dispense Refill  . albuterol (PROVENTIL HFA;VENTOLIN HFA) 108 (90 Base) MCG/ACT inhaler Inhale 1-2 puffs into the lungs every 6 (six) hours as needed for  wheezing or shortness of breath. 1 Inhaler 0  . atorvastatin (LIPITOR) 20 MG tablet Take 1 tablet (20 mg total) by mouth daily. 90 tablet 3  . denosumab (XGEVA) 120 MG/1.7ML SOLN Inject 120 mg into the skin every 30 (thirty) days.    . DULoxetine (CYMBALTA) 60 MG capsule Take 1 capsule (60 mg total) by mouth daily. 90 capsule 2  . EPIPEN 2-PAK 0.3 MG/0.3ML SOAJ injection Inject 0.3 mg into the muscle as needed for anaphylaxis.     [name] gabapentin (NEURONTIN) 300 MG capsule Take 1 capsule (300 mg total) by mouth at bedtime. 90 capsule 3  . hydrochlorothiazide (HYDRODIURIL) 25 MG tablet Take 1 tablet (25 mg total) by mouth daily. 90 tablet 3  . leuprolide (LUPRON) 30 MG injection Inject 30 mg into the muscle every 4 (four) months.    . lidocaine-prilocaine (EMLA) cream Apply 1 application topically as needed. (Patient taking differently: Apply 1 application topically as needed (port pain). ) 30 g 0  . magnesium chloride (SLOW-MAG) 64 MG TBEC SR tablet Take 1 tablet (64 mg total) by mouth 2 (two) times daily. 20 tablet 0  . potassium chloride SA (KLOR-CON) 20 MEQ tablet Take 1 tablet (20 mEq total) by mouth 2 (two) times daily. 30 tablet 0  . predniSONE (DELTASONE) 5 MG tablet TAKE 1 TABLET (5 MG TOTAL) BY MOUTH 2 (TWO) TIMES DAILY. (Patient taking differently: Take 5 mg by mouth 2 (  two) times daily with a meal. ) 90 tablet 3  . prochlorperazine (COMPAZINE) 10 MG tablet TAKE 1 TABLET BY MOUTH EVERY 6 HOURS AS NEEDED FOR NAUSEA FOR VOMITING 30 tablet 3  . rOPINIRole (REQUIP) 2 MG tablet Take 1 tablet (2 mg total) by mouth at bedtime. 90 tablet 2   No current facility-administered medications for this visit.   Facility-Administered Medications Ordered in Other Visits  Medication Dose Route Frequency Provider Last Rate Last Admin  . sodium chloride flush (NS) 0.9 % injection 10 mL  10 mL Intracatheter PRN [name], MD   10 mL at 09/07/19 0954     Allergies:  Allergies  Allergen Reactions  .  Bee Venom Anaphylaxis, Shortness Of Breath and Swelling    Tongue swelling.   . Wasp Venom Anaphylaxis, Shortness Of Breath and Swelling    Tongue swelling   . Percocet [Oxycodone-Acetaminophen] Palpitations       Physical Exam:       Blood pressure (!) 162/95, pulse 88, temperature 98.2 F (36.8 C), temperature source Temporal, resp. rate 20, height 5\' 5"  (1.651 m), weight 158 lb 6.4 oz (71.8 kg), SpO2 96 %.     ECOG: 1    General appearance: Alert, awake without any distress. Head: Atraumatic without abnormalities Oropharynx: Without any thrush or ulcers. Eyes: No scleral icterus. Lymph nodes: No lymphadenopathy noted in the cervical, supraclavicular, or axillary nodes Heart:regular rate and rhythm, without any murmurs or gallops.   Lung: Clear to auscultation without any rhonchi, wheezes or dullness to percussion. Abdomin: Soft, nontender without any shifting dullness or ascites. Musculoskeletal: No clubbing or cyanosis. Neurological: No motor or sensory deficits. Skin: No rashes or lesions.                Lab Results: Lab Results  Component Value Date   WBC 5.6 09/28/2019   HGB 10.8 (L) 09/28/2019   HCT 35.7 (L) 09/28/2019   MCV 82.3 09/28/2019   PLT 272 09/28/2019     Chemistry      Component Value Date/Time   NA 144 09/28/2019 0845   NA 141 05/20/2017 1425   K 3.8 09/28/2019 0845   K 4.3 05/20/2017 1425   CL 107 09/28/2019 0845   CL 105 12/08/2012 1504   CO2 25 09/28/2019 0845   CO2 31 (H) 05/20/2017 1425   BUN 14 09/28/2019 0845   BUN 17.8 05/20/2017 1425   CREATININE 0.78 09/28/2019 0845   CREATININE 0.9 05/20/2017 1425      Component Value Date/Time   CALCIUM 8.9 09/28/2019 0845   CALCIUM 9.4 05/20/2017 1425   ALKPHOS 79 09/28/2019 0845   ALKPHOS 72 05/20/2017 1425   AST 22 09/28/2019 0845   AST 15 05/20/2017 1425   ALT 9 09/28/2019 0845   ALT <6 05/20/2017 1425   BILITOT <0.2 (L) 09/28/2019 0845   BILITOT 0.25 05/20/2017  1425        Results for DAIJON, [name] Mathews (MRN [id number]) as of 09/28/2019 09:04  Ref. Range 08/17/2019 12:05 09/07/2019 08:50  Prostate Specific Ag, Serum Latest Ref Range: 0.0 - 4.0 ng/mL 54.0 (H) 48.9 (H)             Impression and Plan:   74 year old man with:    1.  Castration-resistant prostate cancer with disease to the bone since 2013.    He is status post Taxotere chemotherapy with reasonable PSA response that has declined to 48.9 compared to 225 before the start of Taxotere  chemotherapy.  Clinically he is reporting more complaints which could be related to side effects to Taxotere but could also be related to cancer progression.  The drop in his PSA will go against disease progression but the plan is to restage him in any case with CT scan chest abdomen and pelvis to rule out other etiologies of his symptoms.  No additional treatment for his prostate cancer for the time being and consideration for PARP inhibitor will be made pending his guardant 360 analysis.   2. Bone directed therapy: He will receive Xgeva today and repeated in 6 weeks.  Complications including hypocalcemia and osteonecrosis of the jaw were reviewed.  3. Androgen deprivation: Last Eligard was given in January and will be repeated in May 2021.  No complications related to that.   5.  IV access: Port-A-Cath will be in use moving forward without any complications.  6.  Poor p.o. intake and weight loss: Immediate well hydrated him with 500 cc of normal saline and IV antiemetics.  Prescription for Megace will be available to him to boost his appetite with instructions how to use it.  7.  Growth factor support: This will be discontinued with Taxotere chemotherapy.  8.  Sensory neuropathy: Manageable with Neurontin at this time.  9.  Flank pain and back pain: Likely related to malignancy: Prescription for hydrocodone will be made available to him.  10.  Followup: In the next few weeks to follow his  progress.   30  minutes were spent on this encounter.  The time was dedicated to reviewing laboratory data, disease status update addressing complications related to his cancer and cancer therapy.   Zola Button, MD 4/7/20219:28 AM

## 2019-09-28 NOTE — Patient Instructions (Signed)

## 2019-09-29 LAB — PROSTATE-SPECIFIC AG, SERUM (LABCORP): Prostate Specific Ag, Serum: 61.9 ng/mL — ABNORMAL HIGH (ref 0.0–4.0)

## 2019-09-30 ENCOUNTER — Ambulatory Visit: Payer: Medicare Other

## 2019-10-03 ENCOUNTER — Other Ambulatory Visit: Payer: Self-pay

## 2019-10-03 ENCOUNTER — Encounter: Payer: Self-pay | Admitting: Neurology

## 2019-10-03 ENCOUNTER — Ambulatory Visit (INDEPENDENT_AMBULATORY_CARE_PROVIDER_SITE_OTHER): Payer: Medicare Other | Admitting: Neurology

## 2019-10-03 VITALS — BP 152/82 | HR 84 | Temp 98.4°F | Ht 65.0 in | Wt 158.0 lb

## 2019-10-03 DIAGNOSIS — G479 Sleep disorder, unspecified: Secondary | ICD-10-CM

## 2019-10-03 DIAGNOSIS — G4733 Obstructive sleep apnea (adult) (pediatric): Secondary | ICD-10-CM

## 2019-10-03 DIAGNOSIS — G4700 Insomnia, unspecified: Secondary | ICD-10-CM

## 2019-10-03 DIAGNOSIS — G4719 Other hypersomnia: Secondary | ICD-10-CM

## 2019-10-03 DIAGNOSIS — R351 Nocturia: Secondary | ICD-10-CM

## 2019-10-03 DIAGNOSIS — E663 Overweight: Secondary | ICD-10-CM

## 2019-10-03 NOTE — Progress Notes (Signed)
Subjective:    Patient ID: [name] is a 74 y.o. male.  HPI     [name] Age, MD, PhD [facility] [address] [place], [place] [address]  Dear Drs. [name] Mathews and [name] Mathews,   I saw your patient, [name] Mathews, upon your kind request in my sleep clinic today for initial consultation of his sleep disorder, in particular, evaluation of his prior diagnosis of OSA.  The patient is unaccompanied today.  As you know, [name] Mathews is a 74 year old right-handed gentleman with an underlying medical history of hypertension, hyperlipidemia, prostate cancer, restless leg syndrome, and mildly overweight state, who was previously diagnosed with obstructive sleep apnea and placed on CPAP therapy.  Sleep study testing was several years ago, in 2006 or 2007 in [place], [place]; prior sleep study results are not available for my review today.  A CPAP download is not available for my review today, he had an older machine which recently also broke, some 2 months ago.  I reviewed your office note from 09/16/2019.  His Epworth sleepiness score is 4 out of 24, fatigue severity score is 60 out of 63.  He has trouble maintaining sleep, he has woken up with a sense of panic and with palpitations.  He is widowed since 1994, lost his wife to breast cancer.  His daughter stays with him most of the time, son also stays with him from time to time.  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  He is a non-smoker and does not drink alcohol and no daily caffeine.  He used a full facemask in the past, felt, that CPAP was helpful.  He has a variable sleep schedule, generally in bed between 10 and midnight and rise time somewhere between 4 and 7 AM.  He has significant nocturia about 4-5 times per average night and has had occasional morning headaches.  He has lost about 10 pounds since last year.  He has no family history of sleep apnea.  He had  chemotherapy for his prostate Cancer starting in July last year.  He has tried melatonin in the past which did not help.  His Past Medical History Is Significant For: Past Medical History:  Diagnosis Date  . Hyperlipidemia   . Hypertension   . 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 Restless leg syndrome     His Past Surgical History Is Significant For: Past Surgical History:  Procedure Laterality Date  . IR CV LINE INJECTION  02/15/2019  . IR IMAGING GUIDED PORT INSERTION  01/11/2019  . PELVIC LYMPH NODE DISSECTION     prostate remains intact    His Family History Is Significant For: Family History  Problem Relation Mathews of Onset  . Heart attack Mother 36       died of heart attack  . Cervical cancer Sister 4  . Stroke Brother 35    His Social History Is Significant For: Social History   Socioeconomic History  . Marital status: Widowed    Spouse name: Not on file  . Number of children: Not on file  . Years of education: Not on file  . Highest education level: Not on file  Occupational History  . Occupation: retired Chemical engineer: [employer]    Comment: retired  Tobacco Use  . Smoking status: Former Smoker    Packs/day: 0.25    Years: 6.00  Pack years: 1.50    Start date: 28    Quit date: 1975    Years since quitting: 58.3  . Smokeless tobacco: Never Used  Substance and Sexual Activity  . Alcohol use: No  . Drug use: No  . Sexual activity: Never  Other Topics Concern  . Not on file  Social History Narrative   Walks about 0.5 a day. Play a lot of golf. Widowed. Surrounded by his children and grandchildren. Has a Careers adviser, [name] Mathews.   Social Determinants of Health   Financial Resource Strain:   . Difficulty of Paying Living Expenses:   Food Insecurity:   . Worried About Charity fundraiser in the Last Year:   . Arboriculturist in the Last Year:   Transportation Needs:   . Film/video editor (Medical):   [name] Lack of  Transportation (Non-Medical):   Physical Activity:   . Days of Exercise per Week:   . Minutes of Exercise per Session:   Stress:   . Feeling of Stress :   Social Connections:   . Frequency of Communication with Friends and Family:   . Frequency of Social Gatherings with Friends and Family:   . Attends Religious Services:   . Active Member of Clubs or Organizations:   . Attends Archivist Meetings:   [name] Marital Status:     His Allergies Are:  Allergies  Allergen Reactions  . Bee Venom Anaphylaxis, Shortness Of Breath and Swelling    Tongue swelling.   . Wasp Venom Anaphylaxis, Shortness Of Breath and Swelling    Tongue swelling   . Percocet [Oxycodone-Acetaminophen] Palpitations  :   His Current Medications Are:  Outpatient Encounter Medications as of 10/03/2019  Medication Sig  . albuterol (PROVENTIL HFA;VENTOLIN HFA) 108 (90 Base) MCG/ACT inhaler Inhale 1-2 puffs into the lungs every 6 (six) hours as needed for wheezing or shortness of breath.  [name] atorvastatin (LIPITOR) 20 MG tablet Take 1 tablet (20 mg total) by mouth daily.  [name] denosumab (XGEVA) 120 MG/1.7ML SOLN Inject 120 mg into the skin every 30 (thirty) days.  [name] EPIPEN 2-PAK 0.3 MG/0.3ML SOAJ injection Inject 0.3 mg into the muscle as needed for anaphylaxis.   [name] gabapentin (NEURONTIN) 300 MG capsule Take 1 capsule (300 mg total) by mouth at bedtime.  . hydrochlorothiazide (HYDRODIURIL) 25 MG tablet Take 1 tablet (25 mg total) by mouth daily.  [name] HYDROcodone-acetaminophen (NORCO) 5-325 MG tablet Take 1 tablet by mouth every 6 (six) hours as needed for moderate pain.  [name] leuprolide (LUPRON) 30 MG injection Inject 30 mg into the muscle every 4 (four) months.  . lidocaine-prilocaine (EMLA) cream Apply 1 application topically as needed. (Patient taking differently: Apply 1 application topically as needed (port pain). )  . megestrol (MEGACE) 400 MG/10ML suspension Take 10 mLs (400 mg total) by mouth daily.  .  prochlorperazine (COMPAZINE) 10 MG tablet TAKE 1 TABLET BY MOUTH EVERY 6 HOURS AS NEEDED FOR NAUSEA FOR VOMITING  . rOPINIRole (REQUIP) 2 MG tablet Take 1 tablet (2 mg total) by mouth at bedtime.  . [DISCONTINUED] cetirizine (ZYRTEC) 5 MG tablet Take 1 tablet (5 mg total) by mouth daily. (Patient not taking: Reported on 04/02/2019)  . [DISCONTINUED] DULoxetine (CYMBALTA) 60 MG capsule Take 1 capsule (60 mg total) by mouth daily.  . [DISCONTINUED] magnesium chloride (SLOW-MAG) 64 MG TBEC SR tablet Take 1 tablet (64 mg total) by mouth 2 (two) times daily.  . [DISCONTINUED] potassium chloride SA (  KLOR-CON) 20 MEQ tablet Take 1 tablet (20 mEq total) by mouth 2 (two) times daily.  . [DISCONTINUED] predniSONE (DELTASONE) 5 MG tablet TAKE 1 TABLET (5 MG TOTAL) BY MOUTH 2 (TWO) TIMES DAILY. (Patient taking differently: Take 5 mg by mouth 2 (two) times daily with a meal. )   Facility-Administered Encounter Medications as of 10/03/2019  Medication  . sodium chloride flush (NS) 0.9 % injection 10 mL  :  Review of Systems:  Out of a complete 14 point review of systems, all are reviewed and negative with the exception of these symptoms as listed below:  Review of Systems  Neurological:       Pt presents today to discuss his sleep. Pt has had a sleep study about 15 years ago and was started on cpap but it broke. Pt does not endorse snoring.  Epworth Sleepiness Scale 0= would never doze 1= slight chance of dozing 2= moderate chance of dozing 3= high chance of dozing  Sitting and reading: 1 Watching TV: 0 Sitting inactive in a public place (ex. Theater or meeting): 0 As a passenger in a car for an hour without a break: 2 Lying down to rest in the afternoon: 1 Sitting and talking to someone: 0 Sitting quietly after lunch (no alcohol): 0 In a car, while stopped in traffic: 0 Total: 4     Objective:  Neurological Exam  Physical Exam Physical Examination:   Vitals:   10/03/19 1326  BP: (!)  152/82  Pulse: 84  Temp: 98.4 F (36.9 C)    General Examination: The patient is a very pleasant 74 y.o. male in no acute distress. He appears well-developed and well-nourished and well groomed.   HEENT: Normocephalic, atraumatic, pupils are equal, round and reactive to light, extraocular tracking is good without limitation to gaze excursion or nystagmus noted. Hearing is grossly intact. Face is symmetric with normal facial animation. Speech is clear with no dysarthria noted. There is no hypophonia. There is no lip, neck/head, jaw or voice tremor. Neck is supple with full range of passive and active motion. There are no carotid bruits on auscultation. Oropharynx exam reveals: mild mouth dryness, very few teeth, and mild airway crowding, due to wider and longer uvula. Mallampati is class II. Tongue protrudes centrally and palate elevates symmetrically. Tonsils are small. Neck size is 14 7/8inches.  Chest: Clear to auscultation without wheezing, rhonchi or crackles noted.  Heart: S1+S2+0, regular and normal without murmurs, rubs or gallops noted.   Abdomen: Soft, non-tender and non-distended with normal bowel sounds appreciated on auscultation.  Extremities: There is trace pitting edema in the left more than right distal lower extremities bilaterally.   Skin: Warm and dry without trophic changes noted.   Musculoskeletal: exam reveals no obvious joint deformities, tenderness or joint swelling or erythema.   Neurologically:  Mental status: The patient is awake, alert and oriented in all 4 spheres. His immediate and remote memory, attention, language skills and fund of knowledge are appropriate. There is no evidence of aphasia, agnosia, apraxia or anomia. Speech is clear with normal prosody and enunciation. Thought process is linear. Mood is normal and affect is normal.  Cranial nerves II - XII are as described above under HEENT exam.  Motor exam: Normal bulk, strength and tone is noted. There is  no tremor, Reflexes are 2+ throughout. Fine motor skills and coordination: grossly intact.  Cerebellar testing: No dysmetria or intention tremor. There is no truncal or gait ataxia.  Sensory exam: intact  to light touch in the upper and lower extremities.  Gait, station and balance: He stands easily. No veering to one side is noted. No leaning to one side is noted. Posture is Mathews-appropriate and stance is narrow based. Gait shows normal stride length and normal pace. No problems turning are noted.   Assessment and Plan:  In summary, [name] Mathews is a very pleasant 74 y.o.-year old male  with an underlying medical history of hypertension, hyperlipidemia, prostate cancer, restless leg syndrome, and mildly overweight state, who presents for evaluation of his sleep disorder, in particular, evaluation of his prior diagnosis of OSA.  He endorses difficulty with sleep maintenance and not sleeping well.  He previously benefited from CPAP therapy.  He has lost some weight in the past year.   I had a long chat with the patient about my findings and the diagnosis of OSA, its prognosis and treatment options. We talked about medical treatments, surgical interventions and non-pharmacological approaches. I explained in particular the risks and ramifications of untreated moderate to severe OSA, especially with respect to developing cardiovascular disease down the Road, including congestive heart failure, difficult to treat hypertension, cardiac arrhythmias, or stroke. Even type 2 diabetes has, in part, been linked to untreated OSA. Symptoms of untreated OSA include daytime sleepiness, memory problems, mood irritability and mood disorder such as depression and anxiety, lack of energy, as well as recurrent headaches, especially morning headaches. We talked about trying to maintain a healthy lifestyle in general, as well as the importance of weight control. We also talked about the importance of good sleep hygiene. I  recommended the following at this time: sleep study.  I explained the sleep test procedure to the patient and also outlined possible surgical and non-surgical treatment options of OSA. He indicated that he would be willing to continue with PAP therapy. I answered all his questions today and the patient  was in agreement. I plan to see him back after the sleep study is completed and encouraged him to call with any interim questions, concerns, problems or updates.   Thank you very much for allowing me to participate in the care of this nice patient. If I can be of any further assistance to you please do not hesitate to call me at [phone].  Sincerely,   [name] Age, MD, PhD

## 2019-10-03 NOTE — Patient Instructions (Signed)
Thank you for choosing [facility] for your sleep related care! It was nice to meet you today! I appreciate that you entrust me with your sleep related healthcare concerns. I hope, I was able to address at least some of your concerns today, and that I can help you feel reassured and also get better.    Here is what we discussed today and what we came up with as our plan for you:    Based on your symptoms and your exam I believe you are still at risk for obstructive sleep apnea and would benefit from reevaluation as it has been many years and you need new supplies and an updated machine. Therefore, I think we should proceed with a sleep study to determine how severe your sleep apnea is. If you have more than mild OSA, I want you to consider ongoing treatment with CPAP. Please remember, the risks and ramifications of moderate to severe obstructive sleep apnea or OSA are: Cardiovascular disease, including congestive heart failure, stroke, difficult to control hypertension, arrhythmias, and even type 2 diabetes has been linked to untreated OSA. Sleep apnea causes disruption of sleep and sleep deprivation in most cases, which, in turn, can cause recurrent headaches, problems with memory, mood, concentration, focus, and vigilance. Most people with untreated sleep apnea report excessive daytime sleepiness, which can affect their ability to drive. Please do not drive if you feel sleepy.   I will likely see you back after your sleep study to go over the test results and where to go from there. We will call you after your sleep study to advise about the results (most likely, you will hear from Kristen, my nurse) and to set up an appointment at the time, as necessary.    Our sleep lab administrative assistant will call you to schedule your sleep study. If you don't hear back from her by about 2 weeks from now, please feel free to call her at [phone]. You can leave a message with your phone  number and concerns, if you get the voicemail box. She will call back as soon as possible.     

## 2019-10-05 ENCOUNTER — Ambulatory Visit (HOSPITAL_COMMUNITY)
Admission: RE | Admit: 2019-10-05 | Discharge: 2019-10-05 | Disposition: A | Discharge status: Home / Self Care | Payer: Medicare Other | Source: Ambulatory Visit | Attending: Oncology | Admitting: Oncology

## 2019-10-05 ENCOUNTER — Emergency Department (HOSPITAL_COMMUNITY)
Admission: EM | Admit: 2019-10-05 | Discharge: 2019-10-06 | Disposition: A | Discharge status: Home / Self Care | Payer: Medicare Other | Attending: Emergency Medicine | Admitting: Emergency Medicine

## 2019-10-05 ENCOUNTER — Encounter (HOSPITAL_COMMUNITY): Payer: Self-pay

## 2019-10-05 ENCOUNTER — Emergency Department (HOSPITAL_COMMUNITY): Payer: Medicare Other

## 2019-10-05 ENCOUNTER — Other Ambulatory Visit: Payer: Self-pay

## 2019-10-05 DIAGNOSIS — J320 Chronic maxillary sinusitis: Secondary | ICD-10-CM

## 2019-10-05 DIAGNOSIS — I10 Essential (primary) hypertension: Secondary | ICD-10-CM | POA: Insufficient documentation

## 2019-10-05 DIAGNOSIS — C61 Malignant neoplasm of prostate: Secondary | ICD-10-CM | POA: Insufficient documentation

## 2019-10-05 DIAGNOSIS — Z87891 Personal history of nicotine dependence: Secondary | ICD-10-CM | POA: Insufficient documentation

## 2019-10-05 DIAGNOSIS — Z8546 Personal history of malignant neoplasm of prostate: Secondary | ICD-10-CM | POA: Insufficient documentation

## 2019-10-05 DIAGNOSIS — L03213 Periorbital cellulitis: Secondary | ICD-10-CM

## 2019-10-05 DIAGNOSIS — R791 Abnormal coagulation profile: Secondary | ICD-10-CM | POA: Insufficient documentation

## 2019-10-05 DIAGNOSIS — R519 Headache, unspecified: Secondary | ICD-10-CM | POA: Diagnosis present

## 2019-10-05 LAB — CBC
HCT: 36.5 % — ABNORMAL LOW (ref 39.0–52.0)
Hemoglobin: 11.1 g/dL — ABNORMAL LOW (ref 13.0–17.0)
MCH: 24.6 pg — ABNORMAL LOW (ref 26.0–34.0)
MCHC: 30.4 g/dL (ref 30.0–36.0)
MCV: 80.9 fL (ref 80.0–100.0)
Platelets: 285 10*3/uL (ref 150–400)
RBC: 4.51 MIL/uL (ref 4.22–5.81)
RDW: 17.6 % — ABNORMAL HIGH (ref 11.5–15.5)
WBC: 8 10*3/uL (ref 4.0–10.5)
nRBC: 0 % (ref 0.0–0.2)

## 2019-10-05 LAB — COMPREHENSIVE METABOLIC PANEL
ALT: 9 U/L (ref 0–44)
AST: 25 U/L (ref 15–41)
Albumin: 3.1 g/dL — ABNORMAL LOW (ref 3.5–5.0)
Alkaline Phosphatase: 68 U/L (ref 38–126)
Anion gap: 8 (ref 5–15)
BUN: 14 mg/dL (ref 8–23)
CO2: 27 mmol/L (ref 22–32)
Calcium: 8.4 mg/dL — ABNORMAL LOW (ref 8.9–10.3)
Chloride: 104 mmol/L (ref 98–111)
Creatinine, Ser: 0.76 mg/dL (ref 0.61–1.24)
GFR calc Af Amer: >60 mL/min (ref >60)
GFR calc non Af Amer: >60 mL/min (ref >60)
Glucose, Bld: 96 mg/dL (ref 70–99)
Potassium: 4.4 mmol/L (ref 3.5–5.1)
Sodium: 139 mmol/L (ref 135–145)
Total Bilirubin: 0.6 mg/dL (ref 0.3–1.2)
Total Protein: 7.1 g/dL (ref 6.5–8.1)

## 2019-10-05 LAB — C-REACTIVE PROTEIN: CRP: 13.4 mg/dL — ABNORMAL HIGH (ref <1.0)

## 2019-10-05 LAB — TYPE AND SCREEN
ABO/RH(D): B POS
Antibody Screen: NEGATIVE

## 2019-10-05 LAB — PROTIME-INR
INR: 1.1 (ref 0.8–1.2)
Prothrombin Time: 14 seconds (ref 11.4–15.2)

## 2019-10-05 LAB — APTT: aPTT: 38 seconds — ABNORMAL HIGH (ref 24–36)

## 2019-10-05 LAB — SEDIMENTATION RATE: Sed Rate: 97 mm/hr — ABNORMAL HIGH (ref 0–16)

## 2019-10-05 MED ORDER — PROCHLORPERAZINE EDISYLATE 10 MG/2ML IJ SOLN
10.0000 mg | Freq: Once | INTRAMUSCULAR | Status: AC
  Administered 2019-10-05: 10 mg via INTRAVENOUS
  Filled 2019-10-05: qty 2

## 2019-10-05 MED ORDER — SODIUM CHLORIDE 0.9 % IV SOLN
2.0000 g | Freq: Once | INTRAVENOUS | Status: AC
  Administered 2019-10-05: 2 g via INTRAVENOUS
  Filled 2019-10-05: qty 20

## 2019-10-05 MED ORDER — IOHEXOL 300 MG/ML  SOLN
75.0000 mL | Freq: Once | INTRAMUSCULAR | Status: AC | PRN
  Administered 2019-10-05: 75 mL via INTRAVENOUS

## 2019-10-05 MED ORDER — VANCOMYCIN HCL 1500 MG/300ML IV SOLN
1500.0000 mg | Freq: Once | INTRAVENOUS | Status: AC
  Administered 2019-10-05: 1500 mg via INTRAVENOUS
  Filled 2019-10-05: qty 300

## 2019-10-05 MED ORDER — IOHEXOL 9 MG/ML PO SOLN
1000.0000 mL | ORAL | Status: AC
  Administered 2019-10-05: 07:00:00 1000 mL via ORAL
  Administered 2019-10-05: 08:00:00 500 mL via ORAL

## 2019-10-05 MED ORDER — SODIUM CHLORIDE (PF) 0.9 % IJ SOLN
INTRAMUSCULAR | Status: AC
  Filled 2019-10-05: qty 50

## 2019-10-05 MED ORDER — AMOXICILLIN-POT CLAVULANATE 875-125 MG PO TABS: 1.0000 | ORAL_TABLET | Freq: Two times a day (BID) | ORAL | 0 refills | Status: AC

## 2019-10-05 MED ORDER — IOHEXOL 9 MG/ML PO SOLN
ORAL | Status: AC
  Filled 2019-10-05: qty 1000

## 2019-10-05 MED ORDER — IOHEXOL 9 MG/ML PO SOLN
ORAL | Status: AC
  Filled 2019-10-05: qty 500

## 2019-10-05 MED ORDER — HEPARIN SOD (PORK) LOCK FLUSH 100 UNIT/ML IV SOLN
INTRAVENOUS | Status: AC
  Filled 2019-10-05: qty 5

## 2019-10-05 MED ORDER — SULFAMETHOXAZOLE-TRIMETHOPRIM 800-160 MG PO TABS: 1.0000 | ORAL_TABLET | Freq: Two times a day (BID) | ORAL | 0 refills | Status: AC

## 2019-10-05 MED ORDER — DIPHENHYDRAMINE HCL 50 MG/ML IJ SOLN
25.0000 mg | Freq: Once | INTRAMUSCULAR | Status: AC
  Administered 2019-10-05: 25 mg via INTRAVENOUS
  Filled 2019-10-05: qty 1

## 2019-10-05 MED ORDER — IOHEXOL 300 MG/ML  SOLN
100.0000 mL | Freq: Once | INTRAMUSCULAR | Status: AC | PRN
  Administered 2019-10-05: 09:00:00 100 mL via INTRAVENOUS

## 2019-10-05 MED ORDER — SODIUM CHLORIDE 0.9 % IV BOLUS
1000.0000 mL | Freq: Once | INTRAVENOUS | Status: AC
  Administered 2019-10-05: 1000 mL via INTRAVENOUS

## 2019-10-05 NOTE — ED Provider Notes (Signed)
[facility] Emergency Department Provider Note MRN:  [id number]  Arrival date & time: 10/05/19     Chief Complaint   Headache, Eye Drainage, and Allergies   History of Present Illness   [name] Mathews is a 74 y.o. year-old male with a history of hypertension, hyperlipidemia, prostate cancer presenting to the ED with chief complaint of headache.  Patient explains that he has a history of cluster headaches but has not had a cluster headache in 5 years.  Woke up this morning with very similar symptoms behind his right eye associated with swelling and tearing of the right eye.  Mild blurred vision to the right eye.  Denies numbness or weakness to the arms or legs.  Is currently finishing up chemotherapy for prostate cancer.  Review of Systems  A complete 10 system review of systems was obtained and all systems are negative except as noted in the HPI and PMH.   Patient's Health History    Past Medical History:  Diagnosis Date  . Hyperlipidemia   . Hypertension   . Prostate cancer ([place])    metastasis bones  . Restless leg syndrome     Past Surgical History:  Procedure Laterality Date  . IR CV LINE INJECTION  02/15/2019  . IR IMAGING GUIDED PORT INSERTION  01/11/2019  . PELVIC LYMPH NODE DISSECTION     prostate remains intact    Family History  Problem Relation Age of Onset  . Heart attack Mother 31       died of heart attack  . Cervical cancer Sister 58  . Stroke Brother 62    Social History   Socioeconomic History  . Marital status: Widowed    Spouse name: Not on file  . Number of children: Not on file  . Years of education: Not on file  . Highest education level: Not on file  Occupational History  . Occupation: retired Chemical engineer: [employer]    Comment: retired  Tobacco Use  . Smoking status: Former Smoker    Packs/day: 0.25    Years: 6.00    Pack years: 1.50    Start date: 1969    Quit date: 1975    Years since  quitting: 46.3  . Smokeless tobacco: Never Used  Substance and Sexual Activity  . Alcohol use: No  . Drug use: No  . Sexual activity: Never  Other Topics Concern  . Not on file  Social History Narrative   Walks about 0.5 a day. Play a lot of golf. Widowed. Surrounded by his children and grandchildren. Has a Careers adviser, Felix Ahmadi.   Social Determinants of Health   Financial Resource Strain:   . Difficulty of Paying Living Expenses:   Food Insecurity:   . Worried About Charity fundraiser in the Last Year:   . Arboriculturist in the Last Year:   Transportation Needs:   . Film/video editor (Medical):   [name] Lack of Transportation (Non-Medical):   Physical Activity:   . Days of Exercise per Week:   . Minutes of Exercise per Session:   Stress:   . Feeling of Stress :   Social Connections:   . Frequency of Communication with Friends and Family:   . Frequency of Social Gatherings with Friends and Family:   . Attends Religious Services:   . Active Member of Clubs or Organizations:   . Attends Archivist Meetings:   [name] Marital Status:  Intimate Partner Violence:   . Fear of Current or Ex-Partner:   . Emotionally Abused:   [name] Physically Abused:   . Sexually Abused:      Physical Exam   Vitals:   10/05/19 2030 10/05/19 2115  BP: (!) 172/92 (!) 161/97  Pulse:  88  Resp: 12 20  Temp:    SpO2:  100%    CONSTITUTIONAL: Well-appearing, NAD NEURO:  Alert and oriented x 3, no focal deficits EYES:  eyes equal and reactive, extraocular movements have normal range but elicit pain; there is swelling and erythema to the right periorbital region ENT/NECK:  no LAD, no JVD CARDIO: Regular rate, well-perfused, normal S1 and S2 PULM:  CTAB no wheezing or rhonchi GI/GU:  normal bowel sounds, non-distended, non-tender MSK/SPINE:  No gross deformities, no edema SKIN:  no rash, atraumatic PSYCH:  Appropriate speech and behavior  *Additional and/or pertinent findings included  in MDM below  Diagnostic and Interventional Summary    EKG Interpretation  Date/Time:    Ventricular Rate:    PR Interval:    QRS Duration:   QT Interval:    QTC Calculation:   R Axis:     Text Interpretation:        Labs Reviewed  CBC - Abnormal; Notable for the following components:      Result Value   Hemoglobin 11.1 (*)    HCT 36.5 (*)    MCH 24.6 (*)    RDW 17.6 (*)    All other components within normal limits  COMPREHENSIVE METABOLIC PANEL - Abnormal; Notable for the following components:   Calcium 8.4 (*)    Albumin 3.1 (*)    All other components within normal limits  APTT - Abnormal; Notable for the following components:   aPTT 38 (*)    All other components within normal limits  SEDIMENTATION RATE - Abnormal; Notable for the following components:   Sed Rate 97 (*)    All other components within normal limits  C-REACTIVE PROTEIN - Abnormal; Notable for the following components:   CRP 13.4 (*)    All other components within normal limits  PROTIME-INR  TYPE AND SCREEN  ABO/RH    CT Maxillofacial W Contrast  Final Result    CT Head Wo Contrast  Final Result      Medications  vancomycin (VANCOREADY) IVPB 1500 mg/300 mL (1,500 mg Intravenous New Bag/Given 10/05/19 2225)  diphenhydrAMINE (BENADRYL) injection 25 mg (25 mg Intravenous Given 10/05/19 2120)  prochlorperazine (COMPAZINE) injection 10 mg (10 mg Intravenous Given 10/05/19 2120)  sodium chloride 0.9 % bolus 1,000 mL (1,000 mLs Intravenous New Bag/Given 10/05/19 2117)  cefTRIAXone (ROCEPHIN) 2 g in sodium chloride 0.9 % 100 mL IVPB (0 g Intravenous Stopped 10/05/19 2149)  iohexol (OMNIPAQUE) 300 MG/ML solution 75 mL (75 mLs Intravenous Contrast Given 10/05/19 2205)     Procedures  /  Critical Care Procedures  ED Course and Medical Decision Making  I have reviewed the triage vital signs, the nursing notes, and pertinent available records from the EMR.  Listed above are laboratory and imaging tests  that I personally ordered, reviewed, and interpreted and then considered in my medical decision making (see below for details).      Fever, periorbital swelling, pain with extraocular movements, concern for orbital cellulitis.  Tenderness to the periorbital region in the right temporal region, also considering temporal arteritis, could also be related to his history of cluster headaches though this would be a diagnosis of exclusion.  Will obtain CT imaging of the orbits, patient is febrile, will provide antibiotic coverage.  11 PM update: CT imaging does not show any evidence of orbital cellulitis.  Overall I interpreted the CT findings as likely a chronic or subacute sinus infection that has spread contiguously to cause periorbital cellulitis.  This coincides with the patient who upon further questioning explains that he has been having sinus congestion for several weeks.  Patient continues to look and feel well, no tachycardia, no leukocytosis, does have inflammatory marker elevation in the setting of acute infection.  Given the CT findings I doubt temporal arteritis, all symptoms explained by periorbital cellulitis.  Patient feels well and would like to go home, has a daughter that we will check up on him.  Very strict return precautions provided.  Barth Kirks. [place]onia Small, [place] [email or web address]  Final Clinical Impressions(s) / ED Diagnoses     ICD-10-CM   1. Periorbital cellulitis of right eye  L03.213   2. Maxillary sinusitis, unspecified chronicity  J32.0     ED Discharge Orders         Ordered    amoxicillin-clavulanate (AUGMENTIN) 875-125 MG tablet  Every 12 hours     10/05/19 2309    sulfamethoxazole-trimethoprim (BACTRIM DS) 800-160 MG tablet  2 times daily     10/05/19 2309           Discharge Instructions Discussed with and Provided to Patient:     Discharge Instructions     You were evaluated in the Emergency  Department and after careful evaluation, we did not find any emergent condition requiring admission or further testing in the hospital.  Your exam/testing today is overall reassuring.  Your symptoms seem to be due to an infection of the skin and soft tissues around the right eye, likely coming from a sinus infection.  Please take both antibiotics as directed.  Please return to the Emergency Department if you experience any worsening of your condition.  We encourage you to follow up with a primary care provider.  Thank you for allowing [place] to be a part of your care.       Maudie Flakes, MD 10/05/19 2312

## 2019-10-05 NOTE — ED Notes (Signed)
Walked pt from triage to lobby Pt became dyspneic Pt stated he gets SOB when walking long distances O2 at 85% when in room Pt placed on 3 LPM via [place]

## 2019-10-05 NOTE — ED Triage Notes (Addendum)
Pt states that his right eye will not stay open. Pt states that he woke up this way. Pt states he has pain in his head and jaw as well. Pt has FROM in all extremities. Pt states 10/10 headache. Pt states that he had mucous in his eye prior to arrival. Pt's vision, when he opens his eye is at his baseline- 20/100 (pt did not have glasses with him). Pt states he has not taken any allergy meds PTA.

## 2019-10-05 NOTE — Discharge Instructions (Addendum)
You were evaluated in the Emergency Department and after careful evaluation, we did not find any emergent condition requiring admission or further testing in the hospital.  Your exam/testing today is overall reassuring.  Your symptoms seem to be due to an infection of the skin and soft tissues around the right eye, likely coming from a sinus infection.  Please take both antibiotics as directed.  Please return to the Emergency Department if you experience any worsening of your condition.  We encourage you to follow up with a primary care provider.  Thank you for allowing [place] to be a part of your care.

## 2019-10-06 LAB — ABO/RH: ABO/RH(D): B POS

## 2019-10-10 ENCOUNTER — Inpatient Hospital Stay: Payer: Medicare Other | Admitting: Nutrition

## 2019-10-10 ENCOUNTER — Other Ambulatory Visit: Payer: Self-pay

## 2019-10-10 ENCOUNTER — Inpatient Hospital Stay: Payer: Medicare Other | Admitting: Oncology

## 2019-10-10 ENCOUNTER — Inpatient Hospital Stay: Payer: Medicare Other

## 2019-10-10 VITALS — BP 139/80 | HR 94 | Temp 98.9°F | Resp 18 | Ht 65.0 in | Wt 154.5 lb

## 2019-10-10 DIAGNOSIS — Z7189 Other specified counseling: Secondary | ICD-10-CM

## 2019-10-10 DIAGNOSIS — Z95828 Presence of other vascular implants and grafts: Secondary | ICD-10-CM

## 2019-10-10 DIAGNOSIS — R918 Other nonspecific abnormal finding of lung field: Secondary | ICD-10-CM

## 2019-10-10 DIAGNOSIS — C61 Malignant neoplasm of prostate: Secondary | ICD-10-CM

## 2019-10-10 LAB — CMP (CANCER CENTER ONLY)
ALT: 19 U/L (ref 0–44)
AST: 35 U/L (ref 15–41)
Albumin: 2.4 g/dL — ABNORMAL LOW (ref 3.5–5.0)
Alkaline Phosphatase: 86 U/L (ref 38–126)
Anion gap: 11 (ref 5–15)
BUN: 19 mg/dL (ref 8–23)
CO2: 20 mmol/L — ABNORMAL LOW (ref 22–32)
Calcium: 9.5 mg/dL (ref 8.9–10.3)
Chloride: 108 mmol/L (ref 98–111)
Creatinine: 0.85 mg/dL (ref 0.61–1.24)
GFR, Est AFR Am: >60 mL/min (ref >60)
GFR, Estimated: >60 mL/min (ref >60)
Glucose, Bld: 118 mg/dL — ABNORMAL HIGH (ref 70–99)
Potassium: 4.2 mmol/L (ref 3.5–5.1)
Sodium: 139 mmol/L (ref 135–145)
Total Bilirubin: <0.2 mg/dL — ABNORMAL LOW (ref 0.3–1.2)
Total Protein: 7.2 g/dL (ref 6.5–8.1)

## 2019-10-10 LAB — CBC WITH DIFFERENTIAL (CANCER CENTER ONLY)
Abs Immature Granulocytes: 0.02 10*3/uL (ref 0.00–0.07)
Basophils Absolute: 0 10*3/uL (ref 0.0–0.1)
Basophils Relative: 0 %
Eosinophils Absolute: 0.1 10*3/uL (ref 0.0–0.5)
Eosinophils Relative: 2 %
HCT: 33.8 % — ABNORMAL LOW (ref 39.0–52.0)
Hemoglobin: 10.4 g/dL — ABNORMAL LOW (ref 13.0–17.0)
Immature Granulocytes: 0 %
Lymphocytes Relative: 12 %
Lymphs Abs: 0.9 10*3/uL (ref 0.7–4.0)
MCH: 24.1 pg — ABNORMAL LOW (ref 26.0–34.0)
MCHC: 30.8 g/dL (ref 30.0–36.0)
MCV: 78.2 fL — ABNORMAL LOW (ref 80.0–100.0)
Monocytes Absolute: 0.5 10*3/uL (ref 0.1–1.0)
Monocytes Relative: 6 %
Neutro Abs: 5.7 10*3/uL (ref 1.7–7.7)
Neutrophils Relative %: 80 %
Platelet Count: 334 10*3/uL (ref 150–400)
RBC: 4.32 MIL/uL (ref 4.22–5.81)
RDW: 18.1 % — ABNORMAL HIGH (ref 11.5–15.5)
WBC Count: 7.1 10*3/uL (ref 4.0–10.5)
nRBC: 0 % (ref 0.0–0.2)

## 2019-10-10 MED ORDER — HEPARIN SOD (PORK) LOCK FLUSH 100 UNIT/ML IV SOLN
500.0000 [IU] | Freq: Once | INTRAVENOUS | Status: AC | PRN
  Administered 2019-10-10: 500 [IU]
  Filled 2019-10-10: qty 5

## 2019-10-10 MED ORDER — SODIUM CHLORIDE 0.9% FLUSH
10.0000 mL | INTRAVENOUS | Status: DC | PRN
  Administered 2019-10-10: 10 mL
  Filled 2019-10-10: qty 10

## 2019-10-10 NOTE — Progress Notes (Signed)
Patient was identified to be at risk for malnutrition on the MST secondary to weight loss and poor appetite.  74 year old male diagnosed with prostate cancer receiving Lupron and Xgeva.  He is a patient of Dr. Alen Blew.  Past medical history includes restless leg syndrome, hypertension, and hyperlipidemia.  Medications include Compazine.  Labs include albumin 3.1.  Height: 65 inches. Weight: 154 pounds on April 19. Usual body weight: 182 pounds February 2021. BMI: 25.63.  Patient reports he has had a decreased appetite and early satiety. Reports occasional nausea associated with hot food smells. Reports a metallic taste. He is drinking 3-4 bottles of boost high-protein which provides 240 cal and 20 g protein per bottle.  Nutrition diagnosis:  Unintended weight loss related to prostate cancer as evidenced by 15% weight loss over 2 months which is significant.  Intervention: Educated patient on the importance of consuming smaller more frequent meals and snacks with high-calorie, high-protein foods. Encourage patient to take nausea medicine as needed. Educated on strategies for improving taste alterations.  Encourage patient to rinse his mouth out with baking soda and salt water. Recommended patient change oral nutrition supplements to Ensure Enlive to provide 350 cal and 20 g protein per bottle.  Provided 1 complementary case. Provided fact sheets and contact information.  Questions were answered.  Teach back method used.  Monitoring, evaluation, goals: Patient will tolerate increased calories and protein to minimize further weight loss.  Next visit: Patient will call me with any questions or concerns.  **Disclaimer: This note was dictated with voice recognition software. Similar sounding words can inadvertently be transcribed and this note may contain transcription errors which may not have been corrected upon publication of note.**

## 2019-10-10 NOTE — Progress Notes (Signed)
Hematology and Oncology Follow Up Visit  [name] Mathews [id number] April 03, 1946 74 y.o. 10/10/2019 9:22 AM    Principle Diagnosis: 74 year old man with advanced prostate cancer with disease to the bone diagnosed in 2013.  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   Prior Therapy: 1. He was started on Lupron after attempted prostatectomy in 2001.    2. 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  3. He is status post radiation therapy to the prostate fossa completed in December 2018. He received 52.5 gr 20 fractions.  4. Zytiga 1000 mg po daily with prednisone 5 mg started in 09/2011.   5. Taxotere chemotherapy 75 mg per metered square with cycle 1 started on 01/19/2019.  His dose was reduced to 60 mg per metered square starting with cycle 8 of therapy.  He is status post 11 cycles of therapy.  Current therapy:   Xgeva 120 mg started on 10/31/2011.  He has been receiving it every 6 weeks.  Lupron 30 mg every 4 months.  Next Eligard will be given in May 2021.     Interim History: Mr. Bargar presents today for a return evaluation.  Since the last visit, he reports further decline in his overall health.  He has lost weight and noted a decrease in his appetite.  He was seen in the emergency department on April 14 for sinus infection which appears to be improving at this time.  Continues to have respiratory complaints predominantly dyspnea on exertion but no cough or hemoptysis.  He still able to drive and attends to activities of daily living.         Medications: Updated on review. Current Outpatient Medications  Medication Sig Dispense Refill  . albuterol (PROVENTIL HFA;VENTOLIN HFA) 108 (90 Base) MCG/ACT inhaler Inhale 1-2 puffs into the lungs every 6 (six) hours as needed for wheezing or shortness of breath. 1 Inhaler 0  . amoxicillin-clavulanate (AUGMENTIN) 875-125 MG  tablet Take 1 tablet by mouth every 12 (twelve) hours for 7 days. 14 tablet 0  . atorvastatin (LIPITOR) 20 MG tablet Take 1 tablet (20 mg total) by mouth daily. 90 tablet 3  . denosumab (XGEVA) 120 MG/1.7ML SOLN Inject 120 mg into the skin every 30 (thirty) days.    [name] EPIPEN 2-PAK 0.3 MG/0.3ML SOAJ injection Inject 0.3 mg into the muscle as needed for anaphylaxis.     [name] gabapentin (NEURONTIN) 300 MG capsule Take 1 capsule (300 mg total) by mouth at bedtime. 90 capsule 3  . hydrochlorothiazide (HYDRODIURIL) 25 MG tablet Take 1 tablet (25 mg total) by mouth daily. 90 tablet 3  . HYDROcodone-acetaminophen (NORCO) 5-325 MG tablet Take 1 tablet by mouth every 6 (six) hours as needed for moderate pain. 30 tablet 0  . leuprolide (LUPRON) 30 MG injection Inject 30 mg into the muscle every 4 (four) months.    . lidocaine-prilocaine (EMLA) cream Apply 1 application topically as needed. (Patient not taking: Reported on 10/05/2019) 30 g 0  . megestrol (MEGACE) 400 MG/10ML suspension Take 10 mLs (400 mg total) by mouth daily. 240 mL 0  . prochlorperazine (COMPAZINE) 10 MG tablet TAKE 1 TABLET BY MOUTH EVERY 6 HOURS AS NEEDED FOR NAUSEA FOR VOMITING (Patient taking differently: Take 10 mg by mouth every 6 (six) hours as needed for nausea or vomiting. ) 30 tablet 3  . rOPINIRole (REQUIP) 2 MG tablet Take 1 tablet (2  mg total) by mouth at bedtime. 90 tablet 2  . sulfamethoxazole-trimethoprim (BACTRIM DS) 800-160 MG tablet Take 1 tablet by mouth 2 (two) times daily for 7 days. 14 tablet 0   No current facility-administered medications for this visit.   Facility-Administered Medications Ordered in Other Visits  Medication Dose Route Frequency Provider Last Rate Last Admin  . sodium chloride flush (NS) 0.9 % injection 10 mL  10 mL Intracatheter PRN [name], MD   10 mL at 09/07/19 0954  . sodium chloride flush (NS) 0.9 % injection 10 mL  10 mL Intracatheter PRN [name], MD   10 mL at 10/10/19 0912      Allergies:  Allergies  Allergen Reactions  . Bee Venom Anaphylaxis, Shortness Of Breath and Swelling    Tongue swelling.   . Wasp Venom Anaphylaxis, Shortness Of Breath and Swelling    Tongue swelling   . Percocet [Oxycodone-Acetaminophen] Palpitations       Physical Exam:    Blood pressure 139/80, pulse 94, temperature 98.9 F (37.2 C), temperature source Temporal, resp. rate 18, height 5\' 5"  (1.651 m), weight 154 lb 8 oz (70.1 kg), SpO2 98 %.         ECOG: 1     General appearance: Comfortable appearing without any discomfort Head: Normocephalic without any trauma Oropharynx: Mucous membranes are moist and pink without any thrush or ulcers. Eyes: Pupils are equal and round reactive to light. Lymph nodes: No cervical, supraclavicular, inguinal or axillary lymphadenopathy.   Heart:regular rate and rhythm.  S1 and S2 without leg edema. Lung: Clear to auscultation.  Expiratory wheezes noted and scattered. Abdomin: Soft, nontender, nondistended with good bowel sounds.  No hepatosplenomegaly. Musculoskeletal: No joint deformity or effusion.  Full range of motion noted. Neurological: No deficits noted on motor, sensory and deep tendon reflex exam. Skin: No petechial rash or dryness.  Appeared moist.                  Lab Results: Lab Results  Component Value Date   WBC 8.0 10/05/2019   HGB 11.1 (L) 10/05/2019   HCT 36.5 (L) 10/05/2019   MCV 80.9 10/05/2019   PLT 285 10/05/2019     Chemistry      Component Value Date/Time   NA 139 10/05/2019 2047   NA 141 05/20/2017 1425   K 4.4 10/05/2019 2047   K 4.3 05/20/2017 1425   CL 104 10/05/2019 2047   CL 105 12/08/2012 1504   CO2 27 10/05/2019 2047   CO2 31 (H) 05/20/2017 1425   BUN 14 10/05/2019 2047   BUN 17.8 05/20/2017 1425   CREATININE 0.76 10/05/2019 2047   CREATININE 0.78 09/28/2019 0845   CREATININE 0.9 05/20/2017 1425      Component Value Date/Time   CALCIUM 8.4 (L) 10/05/2019  2047   CALCIUM 9.4 05/20/2017 1425   ALKPHOS 68 10/05/2019 2047   ALKPHOS 72 05/20/2017 1425   AST 25 10/05/2019 2047   AST 22 09/28/2019 0845   AST 15 05/20/2017 1425   ALT 9 10/05/2019 2047   ALT 9 09/28/2019 0845   ALT <6 05/20/2017 1425   BILITOT 0.6 10/05/2019 2047   BILITOT <0.2 (L) 09/28/2019 0845   BILITOT 0.25 05/20/2017 1425       Results for BERT, GETZ (MRN [id number]) as of 10/10/2019 09:23  Ref. Range 09/07/2019 08:50 09/28/2019 08:45  Prostate Specific Ag, Serum Latest Ref Range: 0.0 - 4.0 ng/mL 48.9 (H) 61.9 (H)  IMPRESSION: 1. Worsening of metastatic disease in the chest both lymphangitic and parenchymal involvement with new pleural effusions as described. 2. New thickening of the left juxta diaphragmatic crus, suspicious for metastatic disease. 3. Diffuse skeletal metastatic disease as before. 4. Increasing caliber pulmonary artery may be indicative of developing or worsening of pulmonary arterial hypertension.        Impression and Plan:   74 year old man with:    1.  Advanced prostate cancer diagnosed 2013.  He has castration-resistant at this time..    The natural course of this disease was discussed at this time.  CT scan of the chest abdomen and pelvis obtained on 10/05/2019 was reviewed and showed worsening metastatic disease in the lung with parenchymal and lymphangitic  The pattern of the spread is worrisome for either transformation of a poorly differentiated tumor or a different primary.  Although his PSA did show slight increase up to 61 it is certainly decreased compared to his PSA of 225 in August 2020.  The radiographic progression is worrisome for either different malignancy or transformation.  I have recommended pulmonary evaluation including a possible bronchoscopy and a biopsy to evaluate the etiology of his pulmonary process prior to switching his treatment.  Salvage chemotherapy would be reasonable given his reasonable  performance status at this time and will be tailored pending the results of his pulmonary evaluation.   2. Bone directed therapy: He had received Xgeva in April 2021 and will continue on it in the future.  Complication clinic osteonecrosis of the jaw and hypocalcemia were reiterated.  3. Androgen deprivation: Next Eligard will be given in May 2021.  Long-term complication including weight gain, hot flashes among others were reviewed.  She is agreeable to continue   5.  IV access: Port-A-Cath currently flushed periodically without any issues.  6.  Prognosis and goals of care: The disease progression in the lung is worrisome and carries a poor prognosis overall regardless of the etiology of the process.  His disease is incurable but aggressive therapy is warranted at least for the time being given his performance status is adequate.  7.  Flank pain and back pain: Currently on hydrocodone.  Pain is manageable at this time.  8.  Poor p.o. intake weight loss: We have discussed strategies to boost his appetite and nutritional intake.  He has a dietary consultation today.  9.  Followup: We will be in the next few weeks to follow his progress.   30  minutes were dedicated to this visit.  Time was spent on reviewing imaging studies, discussing the differential diagnosis and management strategies for the future.   Zola Button, MD 4/19/20219:22 AM

## 2019-10-10 NOTE — Patient Instructions (Signed)

## 2019-10-11 ENCOUNTER — Telehealth: Payer: Self-pay | Admitting: Oncology

## 2019-10-11 LAB — PROSTATE-SPECIFIC AG, SERUM (LABCORP): Prostate Specific Ag, Serum: 137 ng/mL — ABNORMAL HIGH (ref 0.0–4.0)

## 2019-10-11 NOTE — Telephone Encounter (Signed)
Scheduled appt per 4/19 sch message - pt is aware of appts

## 2019-10-23 ENCOUNTER — Ambulatory Visit (INDEPENDENT_AMBULATORY_CARE_PROVIDER_SITE_OTHER): Payer: Medicare Other | Admitting: Neurology

## 2019-10-23 DIAGNOSIS — G479 Sleep disorder, unspecified: Secondary | ICD-10-CM

## 2019-10-23 DIAGNOSIS — G4700 Insomnia, unspecified: Secondary | ICD-10-CM

## 2019-10-23 DIAGNOSIS — G4719 Other hypersomnia: Secondary | ICD-10-CM

## 2019-10-23 DIAGNOSIS — E663 Overweight: Secondary | ICD-10-CM

## 2019-10-23 DIAGNOSIS — R351 Nocturia: Secondary | ICD-10-CM

## 2019-10-23 DIAGNOSIS — G4733 Obstructive sleep apnea (adult) (pediatric): Secondary | ICD-10-CM

## 2019-10-23 DIAGNOSIS — G4720 Circadian rhythm sleep disorder, unspecified type: Secondary | ICD-10-CM

## 2019-10-31 ENCOUNTER — Inpatient Hospital Stay: Payer: Medicare Other | Admitting: Oncology

## 2019-10-31 ENCOUNTER — Other Ambulatory Visit: Payer: Self-pay

## 2019-10-31 ENCOUNTER — Inpatient Hospital Stay: Payer: Medicare Other

## 2019-10-31 ENCOUNTER — Inpatient Hospital Stay: Payer: Medicare Other | Attending: Oncology

## 2019-10-31 VITALS — BP 154/89 | HR 79 | Temp 99.1°F | Resp 18 | Ht 65.0 in | Wt 156.4 lb

## 2019-10-31 DIAGNOSIS — C7951 Secondary malignant neoplasm of bone: Secondary | ICD-10-CM | POA: Insufficient documentation

## 2019-10-31 DIAGNOSIS — C61 Malignant neoplasm of prostate: Secondary | ICD-10-CM | POA: Diagnosis not present

## 2019-10-31 DIAGNOSIS — Z5111 Encounter for antineoplastic chemotherapy: Secondary | ICD-10-CM | POA: Insufficient documentation

## 2019-10-31 DIAGNOSIS — Z5189 Encounter for other specified aftercare: Secondary | ICD-10-CM | POA: Diagnosis not present

## 2019-10-31 DIAGNOSIS — Z95828 Presence of other vascular implants and grafts: Secondary | ICD-10-CM

## 2019-10-31 DIAGNOSIS — M545 Low back pain: Secondary | ICD-10-CM | POA: Insufficient documentation

## 2019-10-31 LAB — CBC WITH DIFFERENTIAL (CANCER CENTER ONLY)
Abs Immature Granulocytes: 0.02 10*3/uL (ref 0.00–0.07)
Basophils Absolute: 0 10*3/uL (ref 0.0–0.1)
Basophils Relative: 0 %
Eosinophils Absolute: 0.1 10*3/uL (ref 0.0–0.5)
Eosinophils Relative: 1 %
HCT: 34.7 % — ABNORMAL LOW (ref 39.0–52.0)
Hemoglobin: 10.5 g/dL — ABNORMAL LOW (ref 13.0–17.0)
Immature Granulocytes: 0 %
Lymphocytes Relative: 13 %
Lymphs Abs: 0.9 10*3/uL (ref 0.7–4.0)
MCH: 23.4 pg — ABNORMAL LOW (ref 26.0–34.0)
MCHC: 30.3 g/dL (ref 30.0–36.0)
MCV: 77.5 fL — ABNORMAL LOW (ref 80.0–100.0)
Monocytes Absolute: 0.5 10*3/uL (ref 0.1–1.0)
Monocytes Relative: 7 %
Neutro Abs: 5.3 10*3/uL (ref 1.7–7.7)
Neutrophils Relative %: 79 %
Platelet Count: 272 10*3/uL (ref 150–400)
RBC: 4.48 MIL/uL (ref 4.22–5.81)
RDW: 18.9 % — ABNORMAL HIGH (ref 11.5–15.5)
WBC Count: 6.8 10*3/uL (ref 4.0–10.5)
nRBC: 0 % (ref 0.0–0.2)

## 2019-10-31 LAB — CMP (CANCER CENTER ONLY)
ALT: 8 U/L (ref 0–44)
AST: 18 U/L (ref 15–41)
Albumin: 2.6 g/dL — ABNORMAL LOW (ref 3.5–5.0)
Alkaline Phosphatase: 91 U/L (ref 38–126)
Anion gap: 12 (ref 5–15)
BUN: 17 mg/dL (ref 8–23)
CO2: 27 mmol/L (ref 22–32)
Calcium: 8.9 mg/dL (ref 8.9–10.3)
Chloride: 105 mmol/L (ref 98–111)
Creatinine: 0.73 mg/dL (ref 0.61–1.24)
GFR, Est AFR Am: >60 mL/min (ref >60)
GFR, Estimated: >60 mL/min (ref >60)
Glucose, Bld: 91 mg/dL (ref 70–99)
Potassium: 4.1 mmol/L (ref 3.5–5.1)
Sodium: 144 mmol/L (ref 135–145)
Total Bilirubin: 0.3 mg/dL (ref 0.3–1.2)
Total Protein: 7 g/dL (ref 6.5–8.1)

## 2019-10-31 MED ORDER — HEPARIN SOD (PORK) LOCK FLUSH 100 UNIT/ML IV SOLN
500.0000 [IU] | Freq: Once | INTRAVENOUS | Status: AC
  Administered 2019-10-31: 500 [IU] via INTRAVENOUS
  Filled 2019-10-31: qty 5

## 2019-10-31 MED ORDER — LEUPROLIDE ACETATE (4 MONTH) 30 MG ~~LOC~~ KIT
30.0000 mg | PACK | Freq: Once | SUBCUTANEOUS | Status: AC
  Administered 2019-10-31: 30 mg via SUBCUTANEOUS
  Filled 2019-10-31: qty 30

## 2019-10-31 MED ORDER — DENOSUMAB 120 MG/1.7ML ~~LOC~~ SOLN
120.0000 mg | Freq: Once | SUBCUTANEOUS | Status: AC
  Administered 2019-10-31: 120 mg via SUBCUTANEOUS

## 2019-10-31 MED ORDER — SODIUM CHLORIDE 0.9% FLUSH
10.0000 mL | INTRAVENOUS | Status: DC | PRN
  Administered 2019-10-31: 10 mL
  Filled 2019-10-31: qty 10

## 2019-10-31 MED ORDER — SODIUM CHLORIDE 0.9% FLUSH
10.0000 mL | Freq: Once | INTRAVENOUS | Status: AC
  Administered 2019-10-31: 10 mL via INTRAVENOUS
  Filled 2019-10-31: qty 10

## 2019-10-31 MED ORDER — DENOSUMAB 120 MG/1.7ML ~~LOC~~ SOLN
SUBCUTANEOUS | Status: AC
  Filled 2019-10-31: qty 1.7

## 2019-10-31 NOTE — Progress Notes (Signed)
Hematology and Oncology Follow Up Visit  Mathews Mathews [id number] 1946-04-14 74 y.o. 10/31/2019 9:43 AM    Principle Diagnosis: 74 year old man with castration-resistant prostate cancer with disease to the bone since 2013.  He was diagnosed with advanced disease, Gleason score of 4+5 = 9 in 2001.   Prior Therapy: 1. He was started on Lupron after attempted prostatectomy in 2001.    2. 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  3. He is status post radiation therapy to the prostate fossa completed in December 2018. He received 52.5 gr 20 fractions.  4. Zytiga 1000 mg po daily with prednisone 5 mg started in 09/2011.   5. Taxotere chemotherapy 75 mg per metered square with cycle 1 started on 01/19/2019.  His dose was reduced to 60 mg per metered square starting with cycle 8 of therapy.  He is status post 11 cycles of therapy.  Current therapy:   Xgeva 120 mg started on 10/31/2011.  We will continue to receive that every 4 to 6 weeks.  Lupron 30 mg every 4 months.  He is currently on Eligard which she will receive today and repeated every 4 months.     Interim History: Mathews Mathews returns today for a follow-up visit.  Since the last visit, he has noticed a mild improvement in his overall health although he still quite debilitated.  He is able to ambulate without any major difficulties but did report left lower extremity swelling at times.  He does report some dyspnea on exertion occasional cough that is nonproductive.  Denies any chest pain, palpitation.  He denies any hemoptysis or hematemesis.  His performance status quality of life remained overall stable.  Still able to drive but has not been able to play golf.          Medications: Reviewed without changes. Current Outpatient Medications  Medication Sig Dispense Refill  . albuterol (PROVENTIL HFA;VENTOLIN HFA) 108 (90 Base) MCG/ACT inhaler Inhale 1-2 puffs into the  lungs every 6 (six) hours as needed for wheezing or shortness of breath. 1 Inhaler 0  . atorvastatin (LIPITOR) 20 MG tablet Take 1 tablet (20 mg total) by mouth daily. 90 tablet 3  . denosumab (XGEVA) 120 MG/1.7ML SOLN Inject 120 mg into the skin every 30 (thirty) days.    [name] EPIPEN 2-PAK 0.3 MG/0.3ML SOAJ injection Inject 0.3 mg into the muscle as needed for anaphylaxis.     [name] gabapentin (NEURONTIN) 300 MG capsule Take 1 capsule (300 mg total) by mouth at bedtime. 90 capsule 3  . hydrochlorothiazide (HYDRODIURIL) 25 MG tablet Take 1 tablet (25 mg total) by mouth daily. 90 tablet 3  . HYDROcodone-acetaminophen (NORCO) 5-325 MG tablet Take 1 tablet by mouth every 6 (six) hours as needed for moderate pain. 30 tablet 0  . leuprolide (LUPRON) 30 MG injection Inject 30 mg into the muscle every 4 (four) months.    . megestrol (MEGACE) 400 MG/10ML suspension Take 10 mLs (400 mg total) by mouth daily. 240 mL 0  . prochlorperazine (COMPAZINE) 10 MG tablet TAKE 1 TABLET BY MOUTH EVERY 6 HOURS AS NEEDED FOR NAUSEA FOR VOMITING (Patient taking differently: Take 10 mg by mouth every 6 (six) hours as needed for nausea or vomiting. ) 30 tablet 3  . rOPINIRole (REQUIP) 2 MG tablet Take 1 tablet (2 mg total) by mouth at bedtime. 90 tablet 2   No current facility-administered medications for this visit.  Facility-Administered Medications Ordered in Other Visits  Medication Dose Route Frequency Provider Last Rate Last Admin  . sodium chloride flush (NS) 0.9 % injection 10 mL  10 mL Intracatheter PRN [name], MD   10 mL at 09/07/19 0954     Allergies:  Allergies  Allergen Reactions  . Bee Venom Anaphylaxis, Shortness Of Breath and Swelling    Tongue swelling.   . Wasp Venom Anaphylaxis, Shortness Of Breath and Swelling    Tongue swelling   . Percocet [Oxycodone-Acetaminophen] Palpitations       Physical Exam:    Blood pressure (!) 154/89, pulse 79, temperature 99.1 F (37.3 C), temperature  source Temporal, resp. rate 18, height 5\' 5"  (1.651 m), weight 156 lb 6.4 oz (70.9 kg), SpO2 100 %.         ECOG: 1   . General appearance: Alert, awake without any distress. Head: Atraumatic without abnormalities Oropharynx: Without any thrush or ulcers. Eyes: No scleral icterus. Lymph nodes: No lymphadenopathy noted in the cervical, supraclavicular, or axillary nodes Heart:regular rate and rhythm, without any murmurs or gallops.   Lung: Clear to auscultation without any rhonchi, wheezes or dullness to percussion. Abdomin: Soft, nontender without any shifting dullness or ascites. Musculoskeletal: No clubbing or cyanosis. Neurological: No motor or sensory deficits. Skin: No rashes or lesions.                  Lab Results: Lab Results  Component Value Date   WBC 6.8 10/31/2019   HGB 10.5 (L) 10/31/2019   HCT 34.7 (L) 10/31/2019   MCV 77.5 (L) 10/31/2019   PLT 272 10/31/2019     Chemistry      Component Value Date/Time   NA 139 10/10/2019 0913   NA 141 05/20/2017 1425   K 4.2 10/10/2019 0913   K 4.3 05/20/2017 1425   CL 108 10/10/2019 0913   CL 105 12/08/2012 1504   CO2 20 (L) 10/10/2019 0913   CO2 31 (H) 05/20/2017 1425   BUN 19 10/10/2019 0913   BUN 17.8 05/20/2017 1425   CREATININE 0.85 10/10/2019 0913   CREATININE 0.9 05/20/2017 1425      Component Value Date/Time   CALCIUM 9.5 10/10/2019 0913   CALCIUM 9.4 05/20/2017 1425   ALKPHOS 86 10/10/2019 0913   ALKPHOS 72 05/20/2017 1425   AST 35 10/10/2019 0913   AST 15 05/20/2017 1425   ALT 19 10/10/2019 0913   ALT <6 05/20/2017 1425   BILITOT <0.2 (L) 10/10/2019 0913   BILITOT 0.25 05/20/2017 1425       Results for Mathews Mathews (MRN [id number]) as of 10/31/2019 09:46  Ref. Range 09/28/2019 08:45 10/10/2019 09:13  Prostate Specific Ag, Serum Latest Ref Range: 0.0 - 4.0 ng/mL 61.9 (H) 137.0 (H)         Impression and Plan:   74 year old man with:    1.  Castration-resistant prostate  cancer with disease to the bone since 2013.    He is status post therapy as outlined above and has progressed on Taxotere chemotherapy as evidenced by a rise in his PSA as well as imaging studies obtained on April 14.  Treatment options were reviewed at this time.  His guardant 360 analysis did not show any actionable mutation at this time and will not be a candidate for PARP inhibitor.  Given the rapid rise in his PSA rather quickly and his parenchymal lung involvement potentially I recommended proceeding with chemotherapy as a alternative option.  Jevtana chemotherapy risks and benefits were reviewed.  Complications include nausea, vomiting, myelosuppression, diarrhea, as well as potential sepsis associated with neutropenia were reviewed.  Plan to start therapy in the immediate future he is agreeable.   2. Bone directed therapy: He will receive Xgeva today and repeated in 6 weeks.  I continue to educate him about potential complications including osteonecrosis of the jaw and hypocalcemia  3. Androgen deprivation: He will receive Eligard today and repeated in 4 months.  Complications include weight gain and hot flashes were reiterated.   5.  IV access: Port-A-Cath remains in use without any issues.  6.  Prognosis and goals of care: His disease is incurable although aggressive measures are warranted at this time given his reasonable performance status.  Any therapy is palliative however.  7.  Flank pain and back pain: Manageable with the current pain regimen.  8.  Poor p.o. intake weight loss: Improved and has gained more weight since last visit.  9.  Left lower extremity swelling could be related to fluid retention from Taxotere chemotherapy.  Will obtain ultrasound Dopplers to rule DVT.  10.  Pulmonary nodules and pleural effusion: This is an unusual presentation for prostate cancer.  He has been referred to pulmonary medicine for evaluation and potential biopsy.  11.  Followup: In the  immediate future to start salvage chemotherapy   30  minutes were spent on this encounter.  The time was dedicated to addressing his disease status, complications related to his cancer and cancer treatment option discussion.   Zola Button, MD 5/10/20219:43 AM

## 2019-10-31 NOTE — Progress Notes (Signed)
DISCONTINUE ON PATHWAY REGIMEN - Prostate     A cycle is every 21 days:     Docetaxel      Prednisone   **Always confirm dose/schedule in your pharmacy ordering system**  REASON: Disease Progression PRIOR TREATMENT: POS37: Docetaxel 75 mg/m2 q21 Days + Prednisone 5 mg BID Until Progression or Toxicity TREATMENT RESPONSE: Partial Response (PR)  START ON PATHWAY REGIMEN - Prostate     A cycle is every 21 days.:     Cabazitaxel      Prednisone   **Always confirm dose/schedule in your pharmacy ordering system**  Patient Characteristics: Adenocarcinoma, Recurrent/New Systemic Disease, Castration Resistant, M1, Prior Novel Hormonal Agent, No Molecular Alteration or Targeted Therapy Exhausted, Prior Docetaxel/Docetaxel Not Indicated Histology: Adenocarcinoma Therapeutic Status: Recurrent/New Systemic Disease  Intent of Therapy: Non-Curative / Palliative Intent, Discussed with Patient 

## 2019-10-31 NOTE — Patient Instructions (Signed)
Leuprolide depot injection What is this medicine? LEUPROLIDE (loo PROE lide) is a man-made protein that acts like a natural hormone in the body. It decreases testosterone in men and decreases estrogen in women. In men, this medicine is used to treat advanced prostate cancer. In women, some forms of this medicine may be used to treat endometriosis, uterine fibroids, or other male hormone-related problems. This medicine may be used for other purposes; ask your health care provider or pharmacist if you have questions. COMMON BRAND NAME(S): Eligard, Fensolv, Lupron Depot, Lupron Depot-Ped, Viadur What should I tell my health care provider before I take this medicine? They need to know if you have any of these conditions:  diabetes  heart disease or previous heart attack  high blood pressure  high cholesterol  mental illness  osteoporosis  pain or difficulty passing urine  seizures  spinal cord metastasis  stroke  suicidal thoughts, plans, or attempt; a previous suicide attempt by you or a family member  tobacco smoker  unusual vaginal bleeding (women)  an unusual or allergic reaction to leuprolide, benzyl alcohol, other medicines, foods, dyes, or preservatives  pregnant or trying to get pregnant  breast-feeding How should I use this medicine? This medicine is for injection into a muscle or for injection under the skin. It is given by a health care professional in a hospital or clinic setting. The specific product will determine how it will be given to you. Make sure you understand which product you receive and how often you will receive it. Talk to your pediatrician regarding the use of this medicine in children. Special care may be needed. Overdosage: If you think you have taken too much of this medicine contact a poison control center or emergency room at once. NOTE: This medicine is only for you. Do not share this medicine with others. What if I miss a dose? It is  important not to miss a dose. Call your doctor or health care professional if you are unable to keep an appointment. Depot injections: Depot injections are given either once-monthly, every 12 weeks, every 16 weeks, or every 24 weeks depending on the product you are prescribed. The product you are prescribed will be based on if you are male or male, and your condition. Make sure you understand your product and dosing. What may interact with this medicine? Do not take this medicine with any of the following medications:  chasteberry This medicine may also interact with the following medications:  herbal or dietary supplements, like black cohosh or DHEA  male hormones, like estrogens or progestins and birth control pills, patches, rings, or injections  male hormones, like testosterone This list may not describe all possible interactions. Give your health care provider a list of all the medicines, herbs, non-prescription drugs, or dietary supplements you use. Also tell them if you smoke, drink alcohol, or use illegal drugs. Some items may interact with your medicine. What should I watch for while using this medicine? Visit your doctor or health care professional for regular checks on your progress. During the first weeks of treatment, your symptoms may get worse, but then will improve as you continue your treatment. You may get hot flashes, increased bone pain, increased difficulty passing urine, or an aggravation of nerve symptoms. Discuss these effects with your doctor or health care professional, some of them may improve with continued use of this medicine. Male patients may experience a menstrual cycle or spotting during the first months of therapy with this medicine.   If this continues, contact your doctor or health care professional. This medicine may increase blood sugar. Ask your healthcare provider if changes in diet or medicines are needed if you have diabetes. What side effects may I  notice from receiving this medicine? Side effects that you should report to your doctor or health care professional as soon as possible:  allergic reactions like skin rash, itching or hives, swelling of the face, lips, or tongue  breathing problems  chest pain  depression or memory disorders  pain in your legs or groin  pain at site where injected or implanted  seizures  severe headache  signs and symptoms of high blood sugar such as being more thirsty or hungry or having to urinate more than normal. You may also feel very tired or have blurry vision  swelling of the feet and legs  suicidal thoughts or other mood changes  visual changes  vomiting Side effects that usually do not require medical attention (report to your doctor or health care professional if they continue or are bothersome):  breast swelling or tenderness  decrease in sex drive or performance  diarrhea  hot flashes  loss of appetite  muscle, joint, or bone pains  nausea  redness or irritation at site where injected or implanted  skin problems or acne This list may not describe all possible side effects. Call your doctor for medical advice about side effects. You may report side effects to FDA at 1-800-FDA-1088. Where should I keep my medicine? This drug is given in a hospital or clinic and will not be stored at home. NOTE: This sheet is a summary. It may not cover all possible information. If you have questions about this medicine, talk to your doctor, pharmacist, or health care provider.  2020 Elsevier/Gold Standard (2018-04-08 09:27:03) Denosumab injection What is this medicine? DENOSUMAB (den oh sue mab) slows bone breakdown. Prolia is used to treat osteoporosis in women after menopause and in men, and in people who are taking corticosteroids for 6 months or more. Xgeva is used to treat a high calcium level due to cancer and to prevent bone fractures and other bone problems caused by multiple  myeloma or cancer bone metastases. Xgeva is also used to treat giant cell tumor of the bone. This medicine may be used for other purposes; ask your health care provider or pharmacist if you have questions. COMMON BRAND NAME(S): Prolia, XGEVA What should I tell my health care provider before I take this medicine? They need to know if you have any of these conditions:  dental disease  having surgery or tooth extraction  infection  kidney disease  low levels of calcium or Vitamin D in the blood  malnutrition  on hemodialysis  skin conditions or sensitivity  thyroid or parathyroid disease  an unusual reaction to denosumab, other medicines, foods, dyes, or preservatives  pregnant or trying to get pregnant  breast-feeding How should I use this medicine? This medicine is for injection under the skin. It is given by a health care professional in a hospital or clinic setting. A special MedGuide will be given to you before each treatment. Be sure to read this information carefully each time. For Prolia, talk to your pediatrician regarding the use of this medicine in children. Special care may be needed. For Xgeva, talk to your pediatrician regarding the use of this medicine in children. While this drug may be prescribed for children as young as 13 years for selected conditions, precautions do apply. Overdosage:   If you think you have taken too much of this medicine contact a poison control center or emergency room at once. NOTE: This medicine is only for you. Do not share this medicine with others. What if I miss a dose? It is important not to miss your dose. Call your doctor or health care professional if you are unable to keep an appointment. What may interact with this medicine? Do not take this medicine with any of the following medications:  other medicines containing denosumab This medicine may also interact with the following medications:  medicines that lower your chance of  fighting infection  steroid medicines like prednisone or cortisone This list may not describe all possible interactions. Give your health care provider a list of all the medicines, herbs, non-prescription drugs, or dietary supplements you use. Also tell them if you smoke, drink alcohol, or use illegal drugs. Some items may interact with your medicine. What should I watch for while using this medicine? Visit your doctor or health care professional for regular checks on your progress. Your doctor or health care professional may order blood tests and other tests to see how you are doing. Call your doctor or health care professional for advice if you get a fever, chills or sore throat, or other symptoms of a cold or flu. Do not treat yourself. This drug may decrease your body's ability to fight infection. Try to avoid being around people who are sick. You should make sure you get enough calcium and vitamin D while you are taking this medicine, unless your doctor tells you not to. Discuss the foods you eat and the vitamins you take with your health care professional. See your dentist regularly. Brush and floss your teeth as directed. Before you have any dental work done, tell your dentist you are receiving this medicine. Do not become pregnant while taking this medicine or for 5 months after stopping it. Talk with your doctor or health care professional about your birth control options while taking this medicine. Women should inform their doctor if they wish to become pregnant or think they might be pregnant. There is a potential for serious side effects to an unborn child. Talk to your health care professional or pharmacist for more information. What side effects may I notice from receiving this medicine? Side effects that you should report to your doctor or health care professional as soon as possible:  allergic reactions like skin rash, itching or hives, swelling of the face, lips, or tongue  bone  pain  breathing problems  dizziness  jaw pain, especially after dental work  redness, blistering, peeling of the skin  signs and symptoms of infection like fever or chills; cough; sore throat; pain or trouble passing urine  signs of low calcium like fast heartbeat, muscle cramps or muscle pain; pain, tingling, numbness in the hands or feet; seizures  unusual bleeding or bruising  unusually weak or tired Side effects that usually do not require medical attention (report to your doctor or health care professional if they continue or are bothersome):  constipation  diarrhea  headache  joint pain  loss of appetite  muscle pain  runny nose  tiredness  upset stomach This list may not describe all possible side effects. Call your doctor for medical advice about side effects. You may report side effects to FDA at 1-800-FDA-1088. Where should I keep my medicine? This medicine is only given in a clinic, doctor's office, or other health care setting and will not be   stored at home. NOTE: This sheet is a summary. It may not cover all possible information. If you have questions about this medicine, talk to your doctor, pharmacist, or health care provider.  2020 Elsevier/Gold Standard (2017-10-16 16:10:44)  

## 2019-11-01 ENCOUNTER — Telehealth: Payer: Self-pay | Admitting: Oncology

## 2019-11-01 ENCOUNTER — Ambulatory Visit (HOSPITAL_COMMUNITY)
Admission: RE | Admit: 2019-11-01 | Discharge: 2019-11-01 | Disposition: A | Discharge status: Home / Self Care | Payer: Medicare Other | Source: Ambulatory Visit | Attending: Oncology | Admitting: Oncology

## 2019-11-01 DIAGNOSIS — C61 Malignant neoplasm of prostate: Secondary | ICD-10-CM | POA: Insufficient documentation

## 2019-11-01 LAB — PROSTATE-SPECIFIC AG, SERUM (LABCORP): Prostate Specific Ag, Serum: 88 ng/mL — ABNORMAL HIGH (ref 0.0–4.0)

## 2019-11-01 NOTE — Telephone Encounter (Signed)
Scheduled appt per 5/10 los.  The treatment that was suppose to be scheduled for 6/8, per md schedule for 6/7.  Was not able to add a lab and port before the md visit on 6/8 per the los.  Left a vm of the appt date and time,

## 2019-11-01 NOTE — Progress Notes (Signed)
Lower extremity venous has been completed.   Preliminary results in CV Proc.   Abram Sander 11/01/2019 10:20 AM

## 2019-11-02 NOTE — Progress Notes (Signed)
Pharmacist Chemotherapy Monitoring - Initial Assessment    Anticipated start date: 11/08/2019   Regimen:  . Are orders appropriate based on the patient's diagnosis, regimen, and cycle? Yes . Does the plan date match the patient's scheduled date? Yes . Is the sequencing of drugs appropriate? Yes . Are the premedications appropriate for the patient's regimen? Yes . Prior Authorization for treatment is: Not Started o If applicable, is the correct biosimilar selected based on the patient's insurance? not applicable  Organ Function and Labs: [name] Are dose adjustments needed based on the patient's renal function, hepatic function, or hematologic function? Yes . Are appropriate labs ordered prior to the start of patient's treatment?yes . Other organ system assessment, if indicated: N/A . The following baseline labs, if indicated, have been ordered: n/a  Dose Assessment: . Are the drug doses appropriate? Yes . Are the following correct: o Drug concentrations Yes o IV fluid compatible with drug Yes o Administration routes Yes o Timing of therapy Yes . If applicable, does the patient have documented access for treatment and/or plans for port-a-cath placement? yes . If applicable, have lifetime cumulative doses been properly documented and assessed? not applicable Lifetime Dose Tracking  No doses have been documented on this patient for the following tracked chemicals: Doxorubicin, Epirubicin, Idarubicin, Daunorubicin, Mitoxantrone, Bleomycin, Oxaliplatin, Carboplatin, Liposomal Doxorubicin  o   Toxicity Monitoring/Prevention: . The patient has the following take home antiemetics prescribed: Prochlorperazine . The patient has the following take home medications prescribed: N/A . Medication allergies and previous infusion related reactions, if applicable, have been reviewed and addressed. No . The patient's current medication list has been assessed for drug-drug interactions with their  chemotherapy regimen. no significant drug-drug interactions were identified on review.  Order Review: . Are the treatment plan orders signed? Yes . Is the patient scheduled to see a provider prior to their treatment? No  I verify that I have reviewed each item in the above checklist and answered each question accordingly.  Hobart,Amenda Duclos D 11/02/2019 1:28 PM

## 2019-11-03 ENCOUNTER — Other Ambulatory Visit: Payer: Self-pay

## 2019-11-03 ENCOUNTER — Encounter: Payer: Self-pay | Admitting: Pulmonary Disease

## 2019-11-03 ENCOUNTER — Ambulatory Visit: Payer: Medicare Other | Admitting: Pulmonary Disease

## 2019-11-03 ENCOUNTER — Other Ambulatory Visit: Payer: Self-pay | Admitting: Pulmonary Disease

## 2019-11-03 VITALS — BP 134/76 | HR 97 | Temp 97.5°F | Ht 65.0 in | Wt 156.0 lb

## 2019-11-03 DIAGNOSIS — J90 Pleural effusion, not elsewhere classified: Secondary | ICD-10-CM

## 2019-11-03 DIAGNOSIS — R0602 Shortness of breath: Secondary | ICD-10-CM | POA: Diagnosis not present

## 2019-11-03 DIAGNOSIS — C7800 Secondary malignant neoplasm of unspecified lung: Secondary | ICD-10-CM | POA: Diagnosis not present

## 2019-11-03 DIAGNOSIS — C61 Malignant neoplasm of prostate: Secondary | ICD-10-CM | POA: Diagnosis not present

## 2019-11-03 NOTE — Progress Notes (Signed)
[name] Mathews    LF:[id number]    24-Nov-1945  Primary Care Physician:Fletcher, Edison Nasuti, MD  Referring Physician: [name], MD [address] [place],  [place] [address]  Chief complaint:   Patient being seen for shortness of breath  HPI:  History of metastatic prostate cancer for which he started a new medication in the next few days Has been short of breath for many months-since September  He was recently followed up by oncology and noted to have what appears to be metastatic disease with large bilateral pleural effusions worse on the right  Remote smoking history quit in 1972 less than half a pack a day for less than 5 to 7 years  With any kind of activity Productive cough, clear phlegm, no fevers or chills  He had some weight loss, decreased appetite  Therapy since July 2020   Outpatient Encounter Medications as of 11/03/2019  Medication Sig  . albuterol (PROVENTIL HFA;VENTOLIN HFA) 108 (90 Base) MCG/ACT inhaler Inhale 1-2 puffs into the lungs every 6 (six) hours as needed for wheezing or shortness of breath.  [name] atorvastatin (LIPITOR) 20 MG tablet Take 1 tablet (20 mg total) by mouth daily.  [name] denosumab (XGEVA) 120 MG/1.7ML SOLN Inject 120 mg into the skin every 30 (thirty) days.  [name] EPIPEN 2-PAK 0.3 MG/0.3ML SOAJ injection Inject 0.3 mg into the muscle as needed for anaphylaxis.   [name] gabapentin (NEURONTIN) 300 MG capsule Take 1 capsule (300 mg total) by mouth at bedtime.  . hydrochlorothiazide (HYDRODIURIL) 25 MG tablet Take 1 tablet (25 mg total) by mouth daily.  [name] HYDROcodone-acetaminophen (NORCO) 5-325 MG tablet Take 1 tablet by mouth every 6 (six) hours as needed for moderate pain.  [name] leuprolide (LUPRON) 30 MG injection Inject 30 mg into the muscle every 4 (four) months.  . megestrol (MEGACE) 400 MG/10ML suspension Take 10 mLs (400 mg total) by mouth daily.  . prochlorperazine (COMPAZINE) 10 MG tablet TAKE 1 TABLET BY MOUTH EVERY 6 HOURS AS NEEDED FOR  NAUSEA FOR VOMITING (Patient taking differently: Take 10 mg by mouth every 6 (six) hours as needed for nausea or vomiting. )  . rOPINIRole (REQUIP) 2 MG tablet Take 1 tablet (2 mg total) by mouth at bedtime.   Facility-Administered Encounter Medications as of 11/03/2019  Medication  . sodium chloride flush (NS) 0.9 % injection 10 mL    Allergies as of 11/03/2019 - Review Complete 11/03/2019  Allergen Reaction Noted  . Bee venom Anaphylaxis, Shortness Of Breath, and Swelling 01/04/2016  . Wasp venom Anaphylaxis, Shortness Of Breath, and Swelling 01/04/2016  . Percocet [oxycodone-acetaminophen] Palpitations 11/04/2013    Past Medical History:  Diagnosis Date  . Hyperlipidemia   . Hypertension   . Prostate cancer ([facility])    metastasis bones  . Restless leg syndrome     Past Surgical History:  Procedure Laterality Date  . IR CV LINE INJECTION  02/15/2019  . IR IMAGING GUIDED PORT INSERTION  01/11/2019  . PELVIC LYMPH NODE DISSECTION     prostate remains intact    Family History  Problem Relation Age of Onset  . Heart attack Mother 27       died of heart attack  . Cervical cancer Sister 72  . Stroke Brother 79    Social History   Socioeconomic History  . Marital status: Widowed    Spouse name: Not on file  . Number of children: Not on file  . Years  of education: Not on file  . Highest education level: Not on file  Occupational History  . Occupation: retired Chemical engineer: [employer]    Comment: retired  Tobacco Use  . Smoking status: Former Smoker    Packs/day: 0.25    Years: 6.00    Pack years: 1.50    Start date: 1969    Quit date: 1975    Years since quitting: 46.3  . Smokeless tobacco: Never Used  Substance and Sexual Activity  . Alcohol use: No  . Drug use: No  . Sexual activity: Never  Other Topics Concern  . Not on file  Social History Narrative   Walks about 0.5 a day. Play a lot of golf. Widowed. Surrounded by his children and  grandchildren. Has a Careers adviser, Felix Ahmadi.   Social Determinants of Health   Financial Resource Strain:   . Difficulty of Paying Living Expenses:   Food Insecurity:   . Worried About Charity fundraiser in the Last Year:   . Arboriculturist in the Last Year:   Transportation Needs:   . Film/video editor (Medical):   [name] Lack of Transportation (Non-Medical):   Physical Activity:   . Days of Exercise per Week:   . Minutes of Exercise per Session:   Stress:   . Feeling of Stress :   Social Connections:   . Frequency of Communication with Friends and Family:   . Frequency of Social Gatherings with Friends and Family:   . Attends Religious Services:   . Active Member of Clubs or Organizations:   . Attends Archivist Meetings:   [name] Marital Status:   Intimate Partner Violence:   . Fear of Current or Ex-Partner:   . Emotionally Abused:   [name] Physically Abused:   . Sexually Abused:     Review of Systems  Constitutional: Positive for fatigue and fever.  Respiratory: Positive for cough and shortness of breath.   Cardiovascular: Positive for leg swelling. Negative for chest pain.  Gastrointestinal: Negative.     Vitals:   11/03/19 1455  BP: 134/76  Pulse: 97  Temp: (!) 97.5 F (36.4 C)  SpO2: 96%     Physical Exam  Constitutional: He appears well-developed and well-nourished.  Eyes: Pupils are equal, round, and reactive to light.  Neck: No tracheal deviation present. No thyromegaly present.  Cardiovascular: Normal rate and regular rhythm.  Pulmonary/Chest: Effort normal. No respiratory distress. He has no wheezes. He has no rales. He exhibits no tenderness.  Decreased air movement bilaterally  Musculoskeletal:        General: Edema present.     Cervical back: Normal range of motion and neck supple.  Neurological: He is alert.  Skin: Skin is warm.  Psychiatric: He has a normal mood and affect.     Data Reviewed: CT scan of the chest reviewed with the  patient showing bilateral pleural effusion, findings on the CT scan consistent with lymphangitic spread, bilateral pleural effusions  Assessment:  Worsening shortness of breath Metastatic prostate cancer Bilateral pleural effusions Shortness of breath  Ongoing shortness of breath likely related to progressive metastatic disease  Large bilateral pleural effusions may be contributing   Plan/Recommendations: Plan for ultrasound-guided thoracentesis to see if this helps with his symptoms  Will send fluid for analysis  May benefit from pleural drainage catheter placement if he notices improvement in his shortness of breath with drainage  Will schedule thoracentesis   Tahtiana Rozier  Ander Slade MD [facility] 11/03/2019, 3:31 PM  CC: [name], MD

## 2019-11-03 NOTE — Patient Instructions (Signed)
We will make an appointment for drainage of the fluid for 11/04/2019  3:15 PM  The process takes about 15 to 30 minutes-actual drainage of fluid takes 10 to 15 minutes  Processes as we discussed today   Other arrangements will be made once we figure out analysis of the fluid and if the fluid helps your shortness of breath  Tentative follow-up appointment will be 2 to 3 weeks

## 2019-11-04 ENCOUNTER — Encounter: Payer: Self-pay | Admitting: Pulmonary Disease

## 2019-11-04 ENCOUNTER — Inpatient Hospital Stay: Admission: RE | Admit: 2019-11-04 | Payer: Self-pay | Source: Ambulatory Visit

## 2019-11-04 ENCOUNTER — Ambulatory Visit: Payer: Medicare Other | Admitting: Pulmonary Disease

## 2019-11-04 ENCOUNTER — Ambulatory Visit (INDEPENDENT_AMBULATORY_CARE_PROVIDER_SITE_OTHER): Payer: Medicare Other

## 2019-11-04 ENCOUNTER — Other Ambulatory Visit: Payer: Self-pay | Admitting: *Deleted

## 2019-11-04 ENCOUNTER — Other Ambulatory Visit: Payer: Self-pay

## 2019-11-04 VITALS — BP 140/100 | HR 101 | Temp 97.2°F | Ht 65.0 in | Wt 156.8 lb

## 2019-11-04 DIAGNOSIS — J90 Pleural effusion, not elsewhere classified: Secondary | ICD-10-CM

## 2019-11-04 DIAGNOSIS — R0602 Shortness of breath: Secondary | ICD-10-CM

## 2019-11-04 NOTE — Progress Notes (Signed)
[name] Mathews    [id number]    Nov 26, 1945  Primary Care Physician:Fletcher, Edison Nasuti, MD  Referring Physician: Guadalupe Dawn, MD [address],  [place] [address]  Chief complaint:   Patient being seen for shortness of breath, patient was brought in today for possible thoracentesis   HPI: Ultrasound of the right hemithorax revealed a small sleeve of fluid-did not appear large enough to be tapped Ultrasound of the left hemithorax also revealed very minimal fluid This is as compared with recent CT from 4/11 that revealed a large amount of fluid collection  He has not been on diuretics Remains significantly short of breath  11/03/2019 History of metastatic prostate cancer for which he started a new medication in the next few days Has been short of breath for many months-since September  He was recently followed up by oncology and noted to have what appears to be metastatic disease with large bilateral pleural effusions worse on the right  Remote smoking history quit in 1972 less than half a pack a day for less than 5 to 7 years  With any kind of activity Productive cough, clear phlegm, no fevers or chills  He had some weight loss, decreased appetite  Therapy since July 2020   Outpatient Encounter Medications as of 11/04/2019  Medication Sig  . albuterol (PROVENTIL HFA;VENTOLIN HFA) 108 (90 Base) MCG/ACT inhaler Inhale 1-2 puffs into the lungs every 6 (six) hours as needed for wheezing or shortness of breath.  [name] atorvastatin (LIPITOR) 20 MG tablet Take 1 tablet (20 mg total) by mouth daily.  [name] denosumab (XGEVA) 120 MG/1.7ML SOLN Inject 120 mg into the skin every 30 (thirty) days.  [name] EPIPEN 2-PAK 0.3 MG/0.3ML SOAJ injection Inject 0.3 mg into the muscle as needed for anaphylaxis.   [name] gabapentin (NEURONTIN) 300 MG capsule Take 1 capsule (300 mg total) by mouth at bedtime.  . hydrochlorothiazide (HYDRODIURIL) 25 MG tablet Take 1 tablet (25 mg total) by mouth  daily.  [name] HYDROcodone-acetaminophen (NORCO) 5-325 MG tablet Take 1 tablet by mouth every 6 (six) hours as needed for moderate pain.  [name] leuprolide (LUPRON) 30 MG injection Inject 30 mg into the muscle every 4 (four) months.  . megestrol (MEGACE) 400 MG/10ML suspension Take 10 mLs (400 mg total) by mouth daily.  . prochlorperazine (COMPAZINE) 10 MG tablet TAKE 1 TABLET BY MOUTH EVERY 6 HOURS AS NEEDED FOR NAUSEA FOR VOMITING (Patient taking differently: Take 10 mg by mouth every 6 (six) hours as needed for nausea or vomiting. )  . rOPINIRole (REQUIP) 2 MG tablet Take 1 tablet (2 mg total) by mouth at bedtime.   Facility-Administered Encounter Medications as of 11/04/2019  Medication  . sodium chloride flush (NS) 0.9 % injection 10 mL    Allergies as of 11/04/2019 - Review Complete 11/04/2019  Allergen Reaction Noted  . Bee venom Anaphylaxis, Shortness Of Breath, and Swelling 01/04/2016  . Wasp venom Anaphylaxis, Shortness Of Breath, and Swelling 01/04/2016  . Percocet [oxycodone-acetaminophen] Palpitations 11/04/2013    Past Medical History:  Diagnosis Date  . Hyperlipidemia   . Hypertension   . Prostate cancer ([place])    metastasis bones  . Restless leg syndrome     Past Surgical History:  Procedure Laterality Date  . IR CV LINE INJECTION  02/15/2019  . IR IMAGING GUIDED PORT INSERTION  01/11/2019  . PELVIC LYMPH NODE DISSECTION     prostate remains intact    Family History  Problem Relation Age of Onset  . Heart attack Mother 60       died of heart attack  . Cervical cancer Sister 53  . Stroke Brother 58    Social History   Socioeconomic History  . Marital status: Widowed    Spouse name: Not on file  . Number of children: Not on file  . Years of education: Not on file  . Highest education level: Not on file  Occupational History  . Occupation: retired Chemical engineer: [employer]    Comment: retired  Tobacco Use  . Smoking status: Former Smoker     Packs/day: 0.25    Years: 6.00    Pack years: 1.50    Start date: 1969    Quit date: 1975    Years since quitting: 46.3  . Smokeless tobacco: Never Used  Substance and Sexual Activity  . Alcohol use: No  . Drug use: No  . Sexual activity: Never  Other Topics Concern  . Not on file  Social History Narrative   Walks about 0.5 a day. Play a lot of golf. Widowed. Surrounded by his children and grandchildren. Has a Careers adviser, Felix Ahmadi.   Social Determinants of Health   Financial Resource Strain:   . Difficulty of Paying Living Expenses:   Food Insecurity:   . Worried About Charity fundraiser in the Last Year:   . Arboriculturist in the Last Year:   Transportation Needs:   . Film/video editor (Medical):   [name] Lack of Transportation (Non-Medical):   Physical Activity:   . Days of Exercise per Week:   . Minutes of Exercise per Session:   Stress:   . Feeling of Stress :   Social Connections:   . Frequency of Communication with Friends and Family:   . Frequency of Social Gatherings with Friends and Family:   . Attends Religious Services:   . Active Member of Clubs or Organizations:   . Attends Archivist Meetings:   [name] Marital Status:   Intimate Partner Violence:   . Fear of Current or Ex-Partner:   . Emotionally Abused:   [name] Physically Abused:   . Sexually Abused:     Review of Systems  Constitutional: Negative for fatigue and fever.  Respiratory: Positive for cough and shortness of breath.   Cardiovascular: Negative for chest pain and leg swelling.  Gastrointestinal: Negative.     Vitals:   11/04/19 1513  BP: (!) 140/100  Pulse: (!) 101  Temp: (!) 97.2 F (36.2 C)  SpO2: 95%     Physical Exam  Constitutional: He appears well-developed and well-nourished.  Very frail  Eyes: Pupils are equal, round, and reactive to light.  Neck: No tracheal deviation present. No thyromegaly present.  Cardiovascular: Normal rate and regular rhythm.    Pulmonary/Chest: Effort normal. No respiratory distress. He has no wheezes. He has no rales. He exhibits no tenderness.  Decreased air movement bilaterally  Musculoskeletal:        General: Edema present.     Cervical back: Normal range of motion and neck supple.  Neurological: He is alert.  Skin: Skin is warm.  Psychiatric: He has a normal mood and affect.     Data Reviewed: CT scan of the chest reviewed with the patient showing bilateral pleural effusion, findings on the CT scan consistent with lymphangitic spread, bilateral pleural effusions  Ultrasound of the chest right chest-small amount of fluid Left chest-very  minimal fluid  Assessment:  Worsening shortness of breath Metastatic prostate cancer Bilateral pleural effusions -Was not appreciated significantly on ultrasound today Shortness of breath  Ongoing shortness of breath likely related to progressive metastatic disease    Plan/Recommendations:  Chest x-ray today shows significant resolution of a pleural fluid More extensive infiltration  We will order CT scan of the chest  Encouraged to use albuterol for shortness of breath  Progressive lymphangitic spread of prostate cancer  We will update him once CT reviewed  Sherrilyn Rist MD [facility] 11/04/2019, 4:05 PM  CC: Guadalupe Dawn, MD

## 2019-11-04 NOTE — Addendum Note (Signed)
Addended by: Vanessa Barbara on: 11/04/2019 05:07 PM   Modules accepted: Orders

## 2019-11-04 NOTE — Patient Instructions (Signed)
Ultrasound chest did not reveal adequate fluid to be drained  Chest x-ray shows resolution of the significant fluid collection  We will request for CT scan of the chest without contrast Will update you with CT results  Continue bronchodilators shows si we will give 2 weeks gn we need a CT scan of the chest without contrast he can be done anytime sooner than later because still quite short of breath and and ificant improvement  We will give you an appointment for 2 weeks from now

## 2019-11-08 ENCOUNTER — Inpatient Hospital Stay: Payer: Medicare Other

## 2019-11-08 ENCOUNTER — Other Ambulatory Visit: Payer: Self-pay

## 2019-11-08 VITALS — BP 158/88 | HR 80 | Temp 98.6°F | Resp 18 | Ht 65.0 in | Wt 158.0 lb

## 2019-11-08 DIAGNOSIS — Z5111 Encounter for antineoplastic chemotherapy: Secondary | ICD-10-CM | POA: Diagnosis not present

## 2019-11-08 DIAGNOSIS — C9000 Multiple myeloma not having achieved remission: Secondary | ICD-10-CM

## 2019-11-08 DIAGNOSIS — C61 Malignant neoplasm of prostate: Secondary | ICD-10-CM

## 2019-11-08 LAB — CBC WITH DIFFERENTIAL (CANCER CENTER ONLY)
Abs Immature Granulocytes: 0.02 10*3/uL (ref 0.00–0.07)
Basophils Absolute: 0 10*3/uL (ref 0.0–0.1)
Basophils Relative: 0 %
Eosinophils Absolute: 0.1 10*3/uL (ref 0.0–0.5)
Eosinophils Relative: 1 %
HCT: 34.6 % — ABNORMAL LOW (ref 39.0–52.0)
Hemoglobin: 10.5 g/dL — ABNORMAL LOW (ref 13.0–17.0)
Immature Granulocytes: 0 %
Lymphocytes Relative: 8 %
Lymphs Abs: 0.7 10*3/uL (ref 0.7–4.0)
MCH: 23.6 pg — ABNORMAL LOW (ref 26.0–34.0)
MCHC: 30.3 g/dL (ref 30.0–36.0)
MCV: 77.9 fL — ABNORMAL LOW (ref 80.0–100.0)
Monocytes Absolute: 0.6 10*3/uL (ref 0.1–1.0)
Monocytes Relative: 6 %
Neutro Abs: 7.5 10*3/uL (ref 1.7–7.7)
Neutrophils Relative %: 85 %
Platelet Count: 322 10*3/uL (ref 150–400)
RBC: 4.44 MIL/uL (ref 4.22–5.81)
RDW: 18.8 % — ABNORMAL HIGH (ref 11.5–15.5)
WBC Count: 8.8 10*3/uL (ref 4.0–10.5)
nRBC: 0 % (ref 0.0–0.2)

## 2019-11-08 LAB — CMP (CANCER CENTER ONLY)
ALT: 9 U/L (ref 0–44)
AST: 20 U/L (ref 15–41)
Albumin: 2.5 g/dL — ABNORMAL LOW (ref 3.5–5.0)
Alkaline Phosphatase: 113 U/L (ref 38–126)
Anion gap: 13 (ref 5–15)
BUN: 18 mg/dL (ref 8–23)
CO2: 24 mmol/L (ref 22–32)
Calcium: 9 mg/dL (ref 8.9–10.3)
Chloride: 106 mmol/L (ref 98–111)
Creatinine: 0.73 mg/dL (ref 0.61–1.24)
GFR, Est AFR Am: >60 mL/min (ref >60)
GFR, Estimated: >60 mL/min (ref >60)
Glucose, Bld: 106 mg/dL — ABNORMAL HIGH (ref 70–99)
Potassium: 3.8 mmol/L (ref 3.5–5.1)
Sodium: 143 mmol/L (ref 135–145)
Total Bilirubin: <0.2 mg/dL — ABNORMAL LOW (ref 0.3–1.2)
Total Protein: 7.1 g/dL (ref 6.5–8.1)

## 2019-11-08 MED ORDER — SODIUM CHLORIDE 0.9 % IV SOLN
20.0000 mg/m2 | Freq: Once | INTRAVENOUS | Status: AC
  Administered 2019-11-08: 36 mg via INTRAVENOUS
  Filled 2019-11-08: qty 3.6

## 2019-11-08 MED ORDER — DIPHENHYDRAMINE HCL 50 MG/ML IJ SOLN
25.0000 mg | Freq: Once | INTRAMUSCULAR | Status: AC
  Administered 2019-11-08: 25 mg via INTRAVENOUS

## 2019-11-08 MED ORDER — MORPHINE SULFATE 4 MG/ML IJ SOLN
1.0000 mg | Freq: Once | INTRAMUSCULAR | Status: AC
  Administered 2019-11-08: 1 mg via INTRAVENOUS
  Filled 2019-11-08: qty 1

## 2019-11-08 MED ORDER — MORPHINE SULFATE (PF) 4 MG/ML IV SOLN
INTRAVENOUS | Status: AC
  Filled 2019-11-08: qty 1

## 2019-11-08 MED ORDER — SODIUM CHLORIDE 0.9 % IV SOLN
Freq: Once | INTRAVENOUS | Status: AC
  Filled 2019-11-08: qty 250

## 2019-11-08 MED ORDER — SODIUM CHLORIDE 0.9% FLUSH
10.0000 mL | INTRAVENOUS | Status: DC | PRN
  Administered 2019-11-08: 10 mL
  Filled 2019-11-08: qty 10

## 2019-11-08 MED ORDER — FAMOTIDINE IN NACL 20-0.9 MG/50ML-% IV SOLN
INTRAVENOUS | Status: AC
  Filled 2019-11-08: qty 50

## 2019-11-08 MED ORDER — FAMOTIDINE IN NACL 20-0.9 MG/50ML-% IV SOLN
20.0000 mg | Freq: Once | INTRAVENOUS | Status: AC
  Administered 2019-11-08: 20 mg via INTRAVENOUS

## 2019-11-08 MED ORDER — SODIUM CHLORIDE 0.9 % IV SOLN
10.0000 mg | Freq: Once | INTRAVENOUS | Status: AC
  Administered 2019-11-08: 10 mg via INTRAVENOUS
  Filled 2019-11-08: qty 10

## 2019-11-08 MED ORDER — DIPHENHYDRAMINE HCL 50 MG/ML IJ SOLN
INTRAMUSCULAR | Status: AC
  Filled 2019-11-08: qty 1

## 2019-11-08 MED ORDER — FAMOTIDINE IN NACL 20-0.9 MG/50ML-% IV SOLN: 20.0000 mg | Freq: Once | INTRAVENOUS | Status: DC

## 2019-11-08 MED ORDER — HEPARIN SOD (PORK) LOCK FLUSH 100 UNIT/ML IV SOLN
500.0000 [IU] | Freq: Once | INTRAVENOUS | Status: AC | PRN
  Administered 2019-11-08: 500 [IU]
  Filled 2019-11-08: qty 5

## 2019-11-08 NOTE — Procedures (Signed)
PATIENT'S NAME:  [name] Mathews, [name] Mathews DOB:      1946/01/05      MR#:    [id number]     DATE OF RECORDING: 10/23/2019 REFERRING M.D.:  Guadalupe Dawn MD Study Performed:   Baseline Polysomnogram HISTORY: 74 year old man with a history of hypertension, hyperlipidemia, prostate cancer, restless leg syndrome, and mildly overweight state, who was previously diagnosed with obstructive sleep apnea and placed on PAP therapy. His machine recently broke. The patient endorsed the Epworth Sleepiness Scale at 4/24 points. The patient's weight 158 pounds with a height of 65 (inches), resulting in a BMI of 26.4 kg/m2. The patient's neck circumference measured 14.8 inches.  CURRENT MEDICATIONS: Proventil, Lipitor, Xgeva, Neurontin, Hydrodiuril, Norco, Lupron, Emla, Megace, Compazine, Requip.   PROCEDURE:  This is a multichannel digital polysomnogram utilizing the Somnostar 11.2 system.  Electrodes and sensors were applied and monitored per AASM Specifications.   EEG, EOG, Chin and Limb EMG, were sampled at 200 Hz.  ECG, Snore and Nasal Pressure, Thermal Airflow, Respiratory Effort, CPAP Flow and Pressure, Oximetry was sampled at 50 Hz. Digital video and audio were recorded.      BASELINE STUDY  Lights Out was at 21:47 and Lights On at 04:59.  Total recording time (TRT) was 432.5 minutes, with a total sleep time (TST) of 365.5 minutes.   The patient's sleep latency was 21 minutes.  REM latency was 132 minutes, which is mildly delayed. The sleep efficiency was 84.5 %.     SLEEP ARCHITECTURE: WASO ([place] after sleep onset) was 45.5 minutes with mild sleep fragmentation noted. There were 11.5 minutes in Stage N1, 257.5 minutes Stage N2, 61.5 minutes Stage N3 and 35 minutes in Stage REM.  The percentage of Stage N1 was 3.1%, Stage N2 was 70.5%, which is markedly increased, Stage N3 was 16.8% and Stage R (REM sleep) was 9.6%, which is reduced. The arousals were noted as: 60 were spontaneous, 5 were associated with PLMs, 26 were  associated with respiratory events.  RESPIRATORY ANALYSIS:  There were a total of 51 respiratory events:  21 obstructive apneas, 2 central apneas and 0 mixed apneas with a total of 23 apneas and an apnea index (AI) of 3.8 /hour. There were 28 hypopneas with a hypopnea index of 4.6 /hour. The patient also had 0 respiratory event related arousals (RERAs).      The total APNEA/HYPOPNEA INDEX (AHI) was 8.4/hour and the total RESPIRATORY DISTURBANCE INDEX was  8.4 /hour.  14 events occurred in REM sleep and 30 events in NREM. The REM AHI was  24 /hour, versus a non-REM AHI of 6.7. The patient spent 164.5 minutes of total sleep time in the supine position and 201 minutes in non-supine.. The supine AHI was 10.2 versus a non-supine AHI of 6.9.  OXYGEN SATURATION & C02:  The [facility] baseline 02 saturation was 94%, with the lowest being 75%. Time spent below 89% saturation equaled 34 minutes.  PERIODIC LIMB MOVEMENTS: The patient had a total of 6 Periodic Limb Movements.  The Periodic Limb Movement (PLM) index was 1. and the PLM Arousal index was .8/hour.  Audio and video analysis did not show any abnormal or unusual movements, behaviors, phonations or vocalizations. The patient took no bathroom breaks. Mild snoring was noted. The EKG was in keeping with normal sinus rhythm (NSR).  Post-study, the patient indicated that sleep was the same as usual.   IMPRESSION:  1. Obstructive Sleep Apnea (OSA) 2. Dysfunctions associated with sleep stages or arousal from sleep  RECOMMENDATIONS:  1. This study demonstrates overall mild obstructive sleep apnea, moderate during REM sleep with significant REM related desaturations and a total AHI of 8.4/hour, REM AHI of 24/hour, and O2 nadir of 75%. The patient has been on PAP therapy for years and qualifies for new equipment; he will be advised to start autoPAP therapy at home. Alternatively, a full-night CPAP titration study would allow optimization of therapy if needed.  Other treatment options may include avoidance of supine sleep position along with weight loss, upper airway or jaw surgery in selected patients or the use of an oral appliance in certain patients. ENT evaluation and/or consultation with a maxillofacial surgeon or dentist may be feasible in some instances.    2. Please note that untreated obstructive sleep apnea may carry additional perioperative morbidity. Patients with significant obstructive sleep apnea should receive perioperative PAP therapy and the surgeons and particularly the anesthesiologist should be informed of the diagnosis and the severity of the sleep disordered breathing. 3. This study shows sleep fragmentation and abnormal sleep stage percentages; these are nonspecific findings and per se do not signify an intrinsic sleep disorder or a cause for the patient's sleep-related symptoms. Causes include (but are not limited to) the first night effect of the sleep study, circadian rhythm disturbances, medication effect or an underlying mood disorder or medical problem.  4. The patient should be cautioned not to drive, work at heights, or operate dangerous or heavy equipment when tired or sleepy. Review and reiteration of good sleep hygiene measures should be pursued with any patient. 5. The patient will be seen in follow-up by Dr. Rexene Alberts at [facility] for discussion of the test results and further management strategies. The referring provider will be notified of the test results.  I certify that I have reviewed the entire raw data recording prior to the issuance of this report in accordance with the Standards of Accreditation of the American Academy of Sleep Medicine (AASM)  Star Age, MD, PhD Diplomat, American Board of Neurology and Sleep Medicine (Neurology and Sleep Medicine)

## 2019-11-08 NOTE — Addendum Note (Signed)
Addended by: Star Age on: 11/08/2019 07:21 AM   Modules accepted: Orders

## 2019-11-08 NOTE — Patient Instructions (Addendum)
[facility] Discharge Instructions for Patients Receiving Chemotherapy  Today you received the following chemotherapy agents: cabazitaxel.  To help prevent nausea and vomiting after your treatment, we encourage you to take your nausea medication as directed.   If you develop nausea and vomiting that is not controlled by your nausea medication, call the clinic.   BELOW ARE SYMPTOMS THAT SHOULD BE REPORTED IMMEDIATELY:  *FEVER GREATER THAN 100.5 F  *CHILLS WITH OR WITHOUT FEVER  NAUSEA AND VOMITING THAT IS NOT CONTROLLED WITH YOUR NAUSEA MEDICATION  *UNUSUAL SHORTNESS OF BREATH  *UNUSUAL BRUISING OR BLEEDING  TENDERNESS IN MOUTH AND THROAT WITH OR WITHOUT PRESENCE OF ULCERS  *URINARY PROBLEMS  *BOWEL PROBLEMS  UNUSUAL RASH Items with * indicate a potential emergency and should be followed up as soon as possible.  Feel free to call the clinic should you have any questions or concerns. The clinic phone number is [phone].  Please show the CHEMO ALERT CARD at check-in to the Emergency Department and triage nurse.  Cabazitaxel injection What is this medicine? CABAZITAXEL (ka baz i TAX el) is a chemotherapy drug. It is used to treat prostate cancer. It targets fast dividing cells, like cancer cells, and causes these cells to die. This medicine may be used for other purposes; ask your health care provider or pharmacist if you have questions. COMMON BRAND NAME(S): Jevtana What should I tell my health care provider before I take this medicine? They need to know if you have any of these conditions:  history of stomach bleeding  kidney disease  liver disease  low blood counts, like low white cell, platelet, or red cell counts  lung or breathing disease, like asthma  recent or ongoing radiation therapy  take medicines that treat or prevent blood clots  an unusual or allergic reaction to cabazitaxel, polysorbate 80, other medicines, foods, dyes, or  preservatives  pregnant or trying to get pregnant  breast-feeding How should I use this medicine? This medicine is for infusion into a vein. It is given by a health care professional in a hospital or clinic setting. Talk to your pediatrician regarding the use of this medicine in children. Special care may be needed. Overdosage: If you think you have taken too much of this medicine contact a poison control center or emergency room at once. NOTE: This medicine is only for you. Do not share this medicine with others. What if I miss a dose? It is important not to miss your dose. Call your doctor or health care professional if you are unable to keep an appointment. What may interact with this medicine?  antiviral medicines for HIV or AIDS  clarithromycin  medicines for fungal infections like ketoconazole, fluconazole, itraconazole, and voriconazole  nefazodone  telithromycin This list may not describe all possible interactions. Give your health care provider a list of all the medicines, herbs, non-prescription drugs, or dietary supplements you use. Also tell them if you smoke, drink alcohol, or use illegal drugs. Some items may interact with your medicine. What should I watch for while using this medicine? Your condition will be monitored carefully while you are receiving this medicine. This drug may make you feel generally unwell. This is not uncommon, as chemotherapy can affect healthy cells as well as cancer cells. Report any side effects. Continue your course of treatment even though you feel ill unless your doctor tells you to stop. Call your doctor or health care professional for advice if you get a fever, chills or   sore throat, or other symptoms of a cold or flu. Do not treat yourself. This drug decreases your body's ability to fight infections. Try to avoid being around people who are sick. This medicine may increase your risk to bruise or bleed. Call your doctor or health care  professional if you notice any unusual bleeding. Be careful brushing and flossing your teeth or using a toothpick because you may get an infection or bleed more easily. If you have any dental work done, tell your dentist you are receiving this medicine. Avoid taking products that contain aspirin, acetaminophen, ibuprofen, naproxen, or ketoprofen unless instructed by your doctor. These medicines may hide a fever. Do not become pregnant while taking this medicine. Women should inform their doctor if they wish to become pregnant or think they might be pregnant. Men should not father a child while taking this medicine and for 3 months after stopping it. There is a potential for serious side effects to an unborn child. Talk to your health care professional or pharmacist for more information. Do not breast-feed an infant while taking this medicine. What side effects may I notice from receiving this medicine? Side effects that you should report to your doctor or health care professional as soon as possible:  allergic reactions like skin rash, itching or hives, swelling of the face, lips, or tongue  blood in the urine  breathing problems  constipation  dark urine  diarrhea  pain in the lower back or side  pain, tingling, numbness in the hands or feet  pain when urinating  severe abdominal pain  signs of infection - fever or chills, cough, sore throat, pain or difficulty passing urine  signs and symptoms of kidney injury like trouble passing urine or change in the amount of urine  signs of decreased platelets or bleeding - bruising, pinpoint red spots on the skin, black, tarry stools, blood in the urine  signs of decreased red blood cells - unusually weak or tired, fainting spells, lightheadedness  vomiting Side effects that usually do not require medical attention (report to your doctor or health care professional if they continue or are bothersome):  back pain  change in  taste  hair loss  headache  loss of appetite  muscle or joint pain  nausea  upset stomach This list may not describe all possible side effects. Call your doctor for medical advice about side effects. You may report side effects to FDA at 1-800-FDA-1088. Where should I keep my medicine? This drug is given in a hospital or clinic and will not be stored at home. NOTE: This sheet is a summary. It may not cover all possible information. If you have questions about this medicine, talk to your doctor, pharmacist, or health care provider.  2020 Elsevier/Gold Standard (2016-07-04 15:38:47)    

## 2019-11-08 NOTE — Progress Notes (Signed)
Patient referred by Dr. Kris Mouton, seen by me on 10/03/19, diagnostic PSG on 10/23/19.    Please call and notify the patient that the recent sleep study confirmed his diagnosis of obstructive sleep apnea. OSA is mild to moderate. He had a PAP, but it broke recently and I recommend treatment in the form of autoPAP, which means, that we don't have to bring him back for a second sleep study with CPAP, but will let him start using an autoPAP machine at home, through a DME company (of his choice, or as per insurance requirement). The DME representative will educate him on how to use the machine, how to put the mask on, etc. I have placed an order in the chart. Please send referral, talk to patient, send report to referring MD. We will need a FU in sleep clinic for 10 weeks post-PAP set up, please arrange that with me or one of our NPs. Thanks,   Star Age, MD, PhD [facility])

## 2019-11-09 ENCOUNTER — Telehealth: Payer: Self-pay

## 2019-11-09 LAB — PROSTATE-SPECIFIC AG, SERUM (LABCORP): Prostate Specific Ag, Serum: 96.5 ng/mL — ABNORMAL HIGH (ref 0.0–4.0)

## 2019-11-09 NOTE — Telephone Encounter (Signed)
-----   Message from Star Age, MD sent at 11/08/2019  7:21 AM EDT ----- Patient referred by Dr. Kris Mouton, seen by me on 10/03/19, diagnostic PSG on 10/23/19.    Please call and notify the patient that the recent sleep study confirmed his diagnosis of obstructive sleep apnea. OSA is mild to moderate. He had a PAP, but it broke recently and I recommend treatment in the form of autoPAP, which means, that we don't have to bring him back for a second sleep study with CPAP, but will let him start using an autoPAP machine at home, through a DME company (of his choice, or as per insurance requirement). The DME representative will educate him on how to use the machine, how to put the mask on, etc. I have placed an order in the chart. Please send referral, talk to patient, send report to referring MD. We will need a FU in sleep clinic for 10 weeks post-PAP set up, please arrange that with me or one of our NPs. Thanks,   Star Age, MD, PhD [facility])

## 2019-11-10 ENCOUNTER — Inpatient Hospital Stay: Payer: Medicare Other

## 2019-11-10 ENCOUNTER — Other Ambulatory Visit: Payer: Self-pay

## 2019-11-10 VITALS — BP 148/80 | HR 88 | Temp 98.7°F | Resp 18

## 2019-11-10 DIAGNOSIS — C61 Malignant neoplasm of prostate: Secondary | ICD-10-CM

## 2019-11-10 DIAGNOSIS — Z5111 Encounter for antineoplastic chemotherapy: Secondary | ICD-10-CM | POA: Diagnosis not present

## 2019-11-10 MED ORDER — PEGFILGRASTIM-JMDB 6 MG/0.6ML ~~LOC~~ SOSY
6.0000 mg | PREFILLED_SYRINGE | Freq: Once | SUBCUTANEOUS | Status: AC
  Administered 2019-11-10: 6 mg via SUBCUTANEOUS

## 2019-11-10 MED ORDER — PEGFILGRASTIM-JMDB 6 MG/0.6ML ~~LOC~~ SOSY
PREFILLED_SYRINGE | SUBCUTANEOUS | Status: AC
  Filled 2019-11-10: qty 0.6

## 2019-11-10 NOTE — Patient Instructions (Signed)

## 2019-11-10 NOTE — Telephone Encounter (Signed)
Late entry 11/09/2019  I called pt. I advised pt that Dr. Rexene Alberts reviewed their sleep study results and found that pt does have mild to moderate OSA. Dr. Rexene Alberts recommends that pt start auto pap therapy at home. I reviewed PAP compliance expectations with the pt. Pt is agreeable to starting an auto-PAP. I advised pt that an order will be sent to a DME, [facility], and [facility] will call the pt within about one week after they file with the pt's insurance. [facility] will show the pt how to use the machine, fit for masks, and troubleshoot the auto-PAP if needed. A follow up appt was made for insurance purposes on 01/17/2020 at 930 am @[facility] . Pt verbalized understanding to arrive 15 minutes early and bring their auto-PAP. A letter with all of this information in it will be mailed to the pt as a reminder. I verified with the pt that the address we have on file is correct. Pt verbalized understanding of results. Pt had no questions at this time but was encouraged to call back if questions arise. I have sent the order to [facility] and have received confirmation that they have received the order.

## 2019-11-11 ENCOUNTER — Ambulatory Visit (HOSPITAL_COMMUNITY)
Admission: RE | Admit: 2019-11-11 | Discharge: 2019-11-11 | Disposition: A | Discharge status: Home / Self Care | Payer: Medicare Other | Source: Ambulatory Visit | Attending: Pulmonary Disease | Admitting: Pulmonary Disease

## 2019-11-11 DIAGNOSIS — R0602 Shortness of breath: Secondary | ICD-10-CM | POA: Insufficient documentation

## 2019-11-14 ENCOUNTER — Telehealth: Payer: Self-pay | Admitting: Pulmonary Disease

## 2019-11-14 MED ORDER — ALBUTEROL SULFATE HFA 108 (90 BASE) MCG/ACT IN AERS: 1.0000 | INHALATION_SPRAY | Freq: Four times a day (QID) | RESPIRATORY_TRACT | 0 refills | Status: DC | PRN

## 2019-11-14 NOTE — Telephone Encounter (Signed)
Returned call to patent verified med/pharmacy. Pt has scheduled f/u in June to see Dr. Jenetta Downer Per AVS pt to continue bronchodilators Refill sent Nothing further needed.

## 2019-11-17 ENCOUNTER — Telehealth: Payer: Self-pay | Admitting: Pulmonary Disease

## 2019-11-17 DIAGNOSIS — J90 Pleural effusion, not elsewhere classified: Secondary | ICD-10-CM

## 2019-11-17 NOTE — Telephone Encounter (Signed)
I had just spoken with him and said we were going to try and arrange this  Can we schedule [name] for interventional radiology to do thoracentesis for right pleural effusion  I had tried to do the thoracentesis in the office and could not get a good window with the x-ray on that day showing less pleural effusion  He is still quite short of breath and I think a thoracentesis may help him

## 2019-11-22 NOTE — Telephone Encounter (Signed)
Pleural fluid for cytology, protein, LDH, glucose, cell count, Gram stain and cultures

## 2019-11-22 NOTE — Telephone Encounter (Signed)
Dr. Ander Slade Do you want any labs with your thora? I have placed the order if you could please add any labs within the order you would like. We can get this sent off to have IR schedule

## 2019-11-22 NOTE — Telephone Encounter (Signed)
Orders placed. Will route to [facility] to keep an eye out for it

## 2019-11-23 ENCOUNTER — Other Ambulatory Visit (HOSPITAL_COMMUNITY)
Admission: RE | Admit: 2019-11-23 | Discharge: 2019-11-23 | Disposition: A | Discharge status: Home / Self Care | Payer: Medicare Other | Source: Ambulatory Visit | Attending: Pulmonary Disease | Admitting: Pulmonary Disease

## 2019-11-23 DIAGNOSIS — Z20822 Contact with and (suspected) exposure to covid-19: Secondary | ICD-10-CM | POA: Diagnosis not present

## 2019-11-23 DIAGNOSIS — J90 Pleural effusion, not elsewhere classified: Secondary | ICD-10-CM | POA: Diagnosis present

## 2019-11-23 DIAGNOSIS — C7951 Secondary malignant neoplasm of bone: Secondary | ICD-10-CM | POA: Diagnosis not present

## 2019-11-23 LAB — SARS CORONAVIRUS 2 (TAT 6-24 HRS): SARS Coronavirus 2: NEGATIVE

## 2019-11-24 ENCOUNTER — Other Ambulatory Visit: Payer: Self-pay | Admitting: Pulmonary Disease

## 2019-11-24 ENCOUNTER — Telehealth: Payer: Self-pay | Admitting: Pulmonary Disease

## 2019-11-24 MED ORDER — HYDROCODONE-HOMATROPINE 5-1.5 MG/5ML PO SYRP: 5.0000 mL | ORAL_SOLUTION | Freq: Four times a day (QID) | ORAL | 0 refills | Status: DC | PRN

## 2019-11-24 NOTE — Telephone Encounter (Signed)
Hycodan prescription sent in

## 2019-11-24 NOTE — Progress Notes (Unsigned)
Hycodan sent in. 

## 2019-11-24 NOTE — Telephone Encounter (Signed)
Patient called and notified of prescription called in for cough, Hycodan. Patient verbalized understanding of med usage. Also, patient advised to seek emergent care if he develops worsening shortness of breath, fever, or cough that is not relieved by Hycodan.

## 2019-11-24 NOTE — Telephone Encounter (Signed)
Patient requesting medication for cough. Please advise.

## 2019-11-25 ENCOUNTER — Other Ambulatory Visit: Payer: Self-pay

## 2019-11-25 ENCOUNTER — Other Ambulatory Visit (HOSPITAL_COMMUNITY): Payer: Self-pay | Admitting: Physician Assistant

## 2019-11-25 ENCOUNTER — Ambulatory Visit (HOSPITAL_COMMUNITY)
Admission: RE | Admit: 2019-11-25 | Discharge: 2019-11-25 | Disposition: A | Discharge status: Home / Self Care | Payer: Medicare Other | Source: Ambulatory Visit | Attending: Pulmonary Disease | Admitting: Pulmonary Disease

## 2019-11-25 ENCOUNTER — Ambulatory Visit (HOSPITAL_COMMUNITY)
Admission: RE | Admit: 2019-11-25 | Discharge: 2019-11-25 | Disposition: A | Discharge status: Home / Self Care | Payer: Medicare Other | Source: Ambulatory Visit | Attending: Physician Assistant | Admitting: Physician Assistant

## 2019-11-25 DIAGNOSIS — J90 Pleural effusion, not elsewhere classified: Secondary | ICD-10-CM

## 2019-11-25 DIAGNOSIS — Z20822 Contact with and (suspected) exposure to covid-19: Secondary | ICD-10-CM | POA: Insufficient documentation

## 2019-11-25 DIAGNOSIS — C7951 Secondary malignant neoplasm of bone: Secondary | ICD-10-CM | POA: Insufficient documentation

## 2019-11-25 HISTORY — PX: IR THORACENTESIS ASP PLEURAL SPACE W/IMG GUIDE: IMG5380

## 2019-11-25 LAB — GRAM STAIN

## 2019-11-25 LAB — BODY FLUID CELL COUNT WITH DIFFERENTIAL
Eos, Fluid: 0 %
Lymphs, Fluid: 85 %
Monocyte-Macrophage-Serous Fluid: 10 % — ABNORMAL LOW (ref 50–90)
Neutrophil Count, Fluid: 5 % (ref 0–25)
Total Nucleated Cell Count, Fluid: 770 cu mm (ref 0–1000)

## 2019-11-25 LAB — LACTATE DEHYDROGENASE, PLEURAL OR PERITONEAL FLUID: LD, Fluid: 217 U/L — ABNORMAL HIGH (ref 3–23)

## 2019-11-25 LAB — GLUCOSE, PLEURAL OR PERITONEAL FLUID: Glucose, Fluid: 112 mg/dL

## 2019-11-25 LAB — PROTEIN, PLEURAL OR PERITONEAL FLUID: Total protein, fluid: 3.8 g/dL

## 2019-11-25 MED ORDER — LIDOCAINE HCL 1 % IJ SOLN
INTRAMUSCULAR | Status: DC | PRN
  Administered 2019-11-25: 10 mL

## 2019-11-25 MED ORDER — LIDOCAINE HCL 1 % IJ SOLN
INTRAMUSCULAR | Status: AC
  Filled 2019-11-25: qty 20

## 2019-11-25 NOTE — Procedures (Signed)
PROCEDURE SUMMARY:  Successful image-guided right thoracentesis. Yielded 900 milliliters of serosanguineous fluid. Patient tolerated procedure well. EBL: Zero No immediate complications.  Specimen was sent for labs. Post procedure CXR shows no pneumothorax.  Please see imaging section of Epic for full dictation.  Joaquim Nam PA-C 11/25/2019 11:08 AM

## 2019-11-28 ENCOUNTER — Inpatient Hospital Stay: Payer: Medicare Other | Admitting: Oncology

## 2019-11-28 ENCOUNTER — Inpatient Hospital Stay: Payer: Medicare Other

## 2019-11-28 ENCOUNTER — Other Ambulatory Visit: Payer: Self-pay

## 2019-11-28 ENCOUNTER — Inpatient Hospital Stay: Payer: Medicare Other | Attending: Oncology

## 2019-11-28 ENCOUNTER — Telehealth: Payer: Self-pay | Admitting: Oncology

## 2019-11-28 VITALS — BP 121/75 | HR 85 | Temp 98.1°F | Resp 18 | Ht 65.0 in | Wt 151.6 lb

## 2019-11-28 DIAGNOSIS — Z5111 Encounter for antineoplastic chemotherapy: Secondary | ICD-10-CM | POA: Insufficient documentation

## 2019-11-28 DIAGNOSIS — C61 Malignant neoplasm of prostate: Secondary | ICD-10-CM

## 2019-11-28 DIAGNOSIS — Z5189 Encounter for other specified aftercare: Secondary | ICD-10-CM | POA: Insufficient documentation

## 2019-11-28 DIAGNOSIS — C7951 Secondary malignant neoplasm of bone: Secondary | ICD-10-CM | POA: Diagnosis present

## 2019-11-28 LAB — GUARDANT 360

## 2019-11-28 LAB — CBC WITH DIFFERENTIAL (CANCER CENTER ONLY)
Abs Immature Granulocytes: 0.05 10*3/uL (ref 0.00–0.07)
Basophils Absolute: 0 10*3/uL (ref 0.0–0.1)
Basophils Relative: 0 %
Eosinophils Absolute: 0 10*3/uL (ref 0.0–0.5)
Eosinophils Relative: 0 %
HCT: 34.9 % — ABNORMAL LOW (ref 39.0–52.0)
Hemoglobin: 10.6 g/dL — ABNORMAL LOW (ref 13.0–17.0)
Immature Granulocytes: 0 %
Lymphocytes Relative: 7 %
Lymphs Abs: 0.8 10*3/uL (ref 0.7–4.0)
MCH: 23.2 pg — ABNORMAL LOW (ref 26.0–34.0)
MCHC: 30.4 g/dL (ref 30.0–36.0)
MCV: 76.4 fL — ABNORMAL LOW (ref 80.0–100.0)
Monocytes Absolute: 1 10*3/uL (ref 0.1–1.0)
Monocytes Relative: 8 %
Neutro Abs: 10.4 10*3/uL — ABNORMAL HIGH (ref 1.7–7.7)
Neutrophils Relative %: 85 %
Platelet Count: 282 10*3/uL (ref 150–400)
RBC: 4.57 MIL/uL (ref 4.22–5.81)
RDW: 20.4 % — ABNORMAL HIGH (ref 11.5–15.5)
WBC Count: 12.2 10*3/uL — ABNORMAL HIGH (ref 4.0–10.5)
nRBC: 0 % (ref 0.0–0.2)

## 2019-11-28 LAB — CMP (CANCER CENTER ONLY)
ALT: 9 U/L (ref 0–44)
AST: 52 U/L — ABNORMAL HIGH (ref 15–41)
Albumin: 2.4 g/dL — ABNORMAL LOW (ref 3.5–5.0)
Alkaline Phosphatase: 113 U/L (ref 38–126)
Anion gap: 11 (ref 5–15)
BUN: 14 mg/dL (ref 8–23)
CO2: 25 mmol/L (ref 22–32)
Calcium: 8.2 mg/dL — ABNORMAL LOW (ref 8.9–10.3)
Chloride: 105 mmol/L (ref 98–111)
Creatinine: 0.72 mg/dL (ref 0.61–1.24)
GFR, Est AFR Am: >60 mL/min (ref >60)
GFR, Estimated: >60 mL/min (ref >60)
Glucose, Bld: 90 mg/dL (ref 70–99)
Potassium: 4.1 mmol/L (ref 3.5–5.1)
Sodium: 141 mmol/L (ref 135–145)
Total Bilirubin: 0.5 mg/dL (ref 0.3–1.2)
Total Protein: 6.3 g/dL — ABNORMAL LOW (ref 6.5–8.1)

## 2019-11-28 LAB — CYTOLOGY - NON PAP

## 2019-11-28 MED ORDER — SODIUM CHLORIDE 0.9 % IV SOLN
10.0000 mg | Freq: Once | INTRAVENOUS | Status: AC
  Administered 2019-11-28: 10 mg via INTRAVENOUS
  Filled 2019-11-28: qty 10

## 2019-11-28 MED ORDER — FAMOTIDINE IN NACL 20-0.9 MG/50ML-% IV SOLN
20.0000 mg | Freq: Once | INTRAVENOUS | Status: AC
  Administered 2019-11-28: 20 mg via INTRAVENOUS

## 2019-11-28 MED ORDER — SODIUM CHLORIDE 0.9 % IV SOLN
20.0000 mg/m2 | Freq: Once | INTRAVENOUS | Status: AC
  Administered 2019-11-28: 36 mg via INTRAVENOUS
  Filled 2019-11-28: qty 3.6

## 2019-11-28 MED ORDER — SODIUM CHLORIDE 0.9 % IV SOLN
Freq: Once | INTRAVENOUS | Status: AC
  Filled 2019-11-28: qty 250

## 2019-11-28 MED ORDER — FAMOTIDINE IN NACL 20-0.9 MG/50ML-% IV SOLN
INTRAVENOUS | Status: AC
  Filled 2019-11-28: qty 50

## 2019-11-28 MED ORDER — SODIUM CHLORIDE 0.9% FLUSH
10.0000 mL | INTRAVENOUS | Status: DC | PRN
  Administered 2019-11-28: 10 mL
  Filled 2019-11-28: qty 10

## 2019-11-28 MED ORDER — HEPARIN SOD (PORK) LOCK FLUSH 100 UNIT/ML IV SOLN
500.0000 [IU] | Freq: Once | INTRAVENOUS | Status: AC | PRN
  Administered 2019-11-28: 500 [IU]
  Filled 2019-11-28: qty 5

## 2019-11-28 MED ORDER — DIPHENHYDRAMINE HCL 50 MG/ML IJ SOLN
INTRAMUSCULAR | Status: AC
  Filled 2019-11-28: qty 1

## 2019-11-28 MED ORDER — DIPHENHYDRAMINE HCL 50 MG/ML IJ SOLN
25.0000 mg | Freq: Once | INTRAMUSCULAR | Status: AC
  Administered 2019-11-28: 25 mg via INTRAVENOUS

## 2019-11-28 NOTE — Telephone Encounter (Signed)
Scheduled appt per 6/7 los

## 2019-11-28 NOTE — Patient Instructions (Signed)
[facility] Discharge Instructions for Patients Receiving Chemotherapy  Today you received the following chemotherapy agents: cabazitaxel.  To help prevent nausea and vomiting after your treatment, we encourage you to take your nausea medication as directed.   If you develop nausea and vomiting that is not controlled by your nausea medication, call the clinic.   BELOW ARE SYMPTOMS THAT SHOULD BE REPORTED IMMEDIATELY:  *FEVER GREATER THAN 100.5 F  *CHILLS WITH OR WITHOUT FEVER  NAUSEA AND VOMITING THAT IS NOT CONTROLLED WITH YOUR NAUSEA MEDICATION  *UNUSUAL SHORTNESS OF BREATH  *UNUSUAL BRUISING OR BLEEDING  TENDERNESS IN MOUTH AND THROAT WITH OR WITHOUT PRESENCE OF ULCERS  *URINARY PROBLEMS  *BOWEL PROBLEMS  UNUSUAL RASH Items with * indicate a potential emergency and should be followed up as soon as possible.  Feel free to call the clinic should you have any questions or concerns. The clinic phone number is [phone].  Please show the CHEMO ALERT CARD at check-in to the Emergency Department and triage nurse.   

## 2019-11-28 NOTE — Progress Notes (Signed)
Hematology and Oncology Follow Up Visit  [name] Mathews [id number] Jan 14, 1946 74 y.o. 11/28/2019 8:59 AM    Principle Diagnosis: 74 year old man with advanced prostate cancer with disease to the bone noted in 2013.  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    Prior Therapy: 1. He was started on Lupron after attempted prostatectomy in 2001.    2. 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  3. He is status post radiation therapy to the prostate fossa completed in December 2018. He received 52.5 gr 20 fractions.  4. Zytiga 1000 mg po daily with prednisone 5 mg started in 09/2011.   5. Taxotere chemotherapy 75 mg per metered square with cycle 1 started on 01/19/2019.  His dose was reduced to 60 mg per metered square starting with cycle 8 of therapy.  He is status post 11 cycles of therapy.  Current therapy:   Xgeva 120 mg started on 10/31/2011.  He will receive every 6 weeks.   Eligard every 4 months.  Last dose given in May and will be repeated in September 2021.  Jevtana chemotherapy 20 mg per metered square with cycle 1 given on Nov 08, 2019.    Interim History: [name] Mathews presents today for a repeat evaluation.  Since the last visit, he received the first cycle of salvage Jevtana chemotherapy without any major complications.  He did report few loose bowel movements especially after he eats which has bothered him months since the last cycle.  He was evaluated by pulmonary medicine as well and had a thoracentesis completed.  He denies any nausea, vomiting or worsening pain.  He still has some dyspnea on exertion.  His performance status has been limited because of his exertional dyspnea.  He denies any chest pain or palpitation.  He still able to drive at this time.         Medications: Updated on review. Current Outpatient Medications  Medication Sig Dispense Refill  . albuterol  (VENTOLIN HFA) 108 (90 Base) MCG/ACT inhaler Inhale 1-2 puffs into the lungs every 6 (six) hours as needed for wheezing or shortness of breath. 18 g 0  . atorvastatin (LIPITOR) 20 MG tablet Take 1 tablet (20 mg total) by mouth daily. 90 tablet 3  . denosumab (XGEVA) 120 MG/1.7ML SOLN Inject 120 mg into the skin every 30 (thirty) days.    [name] EPIPEN 2-PAK 0.3 MG/0.3ML SOAJ injection Inject 0.3 mg into the muscle as needed for anaphylaxis.     [name] gabapentin (NEURONTIN) 300 MG capsule Take 1 capsule (300 mg total) by mouth at bedtime. 90 capsule 3  . hydrochlorothiazide (HYDRODIURIL) 25 MG tablet Take 1 tablet (25 mg total) by mouth daily. 90 tablet 3  . HYDROcodone-acetaminophen (NORCO) 5-325 MG tablet Take 1 tablet by mouth every 6 (six) hours as needed for moderate pain. 30 tablet 0  . HYDROcodone-homatropine (HYCODAN) 5-1.5 MG/5ML syrup Take 5 mLs by mouth every 6 (six) hours as needed for cough. 120 mL 0  . leuprolide (LUPRON) 30 MG injection Inject 30 mg into the muscle every 4 (four) months.    . megestrol (MEGACE) 400 MG/10ML suspension Take 10 mLs (400 mg total) by mouth daily. 240 mL 0  . prochlorperazine (COMPAZINE) 10 MG tablet TAKE 1 TABLET BY MOUTH EVERY 6 HOURS AS NEEDED FOR NAUSEA FOR VOMITING (Patient taking differently: Take 10 mg by mouth every 6 (six) hours as  needed for nausea or vomiting. ) 30 tablet 3  . rOPINIRole (REQUIP) 2 MG tablet Take 1 tablet (2 mg total) by mouth at bedtime. 90 tablet 2   No current facility-administered medications for this visit.   Facility-Administered Medications Ordered in Other Visits  Medication Dose Route Frequency Provider Last Rate Last Admin  . sodium chloride flush (NS) 0.9 % injection 10 mL  10 mL Intracatheter PRN [name], MD   10 mL at 09/07/19 0954     Allergies:  Allergies  Allergen Reactions  . Bee Venom Anaphylaxis, Shortness Of Breath and Swelling    Tongue swelling.   . Wasp Venom Anaphylaxis, Shortness Of Breath and  Swelling    Tongue swelling   . Percocet [Oxycodone-Acetaminophen] Palpitations       Physical Exam:   Blood pressure 121/75, pulse 85, temperature 98.1 F (36.7 C), temperature source Temporal, resp. rate 18, height 5\' 5"  (1.651 m), weight 151 lb 9.6 oz (68.8 kg), SpO2 94 %.     ECOG: 2    General appearance: Comfortable appearing without any discomfort Head: Normocephalic without any trauma Oropharynx: Mucous membranes are moist and pink without any thrush or ulcers. Eyes: Pupils are equal and round reactive to light. Lymph nodes: No cervical, supraclavicular, inguinal or axillary lymphadenopathy.   Heart:regular rate and rhythm.  S1 and S2 without leg edema. Lung:  Coarse breath sounds bilaterally.  No wheezing. Abdomin: Soft, nontender, nondistended with good bowel sounds.  No hepatosplenomegaly. Musculoskeletal: No joint deformity or effusion.  Full range of motion noted. Neurological: No deficits noted on motor, sensory and deep tendon reflex exam. Skin: No petechial rash or dryness.  Appeared moist.                    Lab Results: Lab Results  Component Value Date   WBC 8.8 11/08/2019   HGB 10.5 (L) 11/08/2019   HCT 34.6 (L) 11/08/2019   MCV 77.9 (L) 11/08/2019   PLT 322 11/08/2019     Chemistry      Component Value Date/Time   NA 143 11/08/2019 0820   NA 141 05/20/2017 1425   K 3.8 11/08/2019 0820   K 4.3 05/20/2017 1425   CL 106 11/08/2019 0820   CL 105 12/08/2012 1504   CO2 24 11/08/2019 0820   CO2 31 (H) 05/20/2017 1425   BUN 18 11/08/2019 0820   BUN 17.8 05/20/2017 1425   CREATININE 0.73 11/08/2019 0820   CREATININE 0.9 05/20/2017 1425      Component Value Date/Time   CALCIUM 9.0 11/08/2019 0820   CALCIUM 9.4 05/20/2017 1425   ALKPHOS 113 11/08/2019 0820   ALKPHOS 72 05/20/2017 1425   AST 20 11/08/2019 0820   AST 15 05/20/2017 1425   ALT 9 11/08/2019 0820   ALT <6 05/20/2017 1425   BILITOT <0.2 (L) 11/08/2019 0820    BILITOT 0.25 05/20/2017 1425               Impression and Plan:   74 year old man with:    1.  Advanced prostate cancer with disease to the bone diagnosed in 2013.  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   He received the first cycle of chemotherapy without any major complications.  Risks and benefits of continuing this treatment long-term were reviewed.  Potential complications include nausea, vomiting, myelosuppression, neutropenia and sepsis.  He is agreeable to continue at this time.  He also understands without this treatments that might not be  successful and different treatments might be required.  He also understands that progression of the disease in his lung has been rather substantial and a serious complication and manifestation of this cancer.  He also understands it possibility of this treatment being unsuccessful completely and transitioning to hospice in the future.  For the time being he would like to continue with aggressive measures which is reasonable at this time.  2. Bone directed therapy: I recommended continuing Xgeva every 6 weeks.  Potential complications include osteonecrosis of the jaw and hypocalcemia were reiterated.  He is agreeable to continue.  3. Androgen deprivation: He is currently on Eligard and received it in May 2021.  This will be repeated in September.  Complication clinic we can plan such as others were reviewed.   5.  IV access: Port-A-Cath currently in use for his chemotherapy treatment without any complications.  6.  Prognosis and goals of care: Therapy is palliative at this time his cancer is incurable.  Formal status is adequate and aggressive measures are warranted.  7.  Flank pain and back pain: Related to malignancy.  Pain is manageable currently.  8.  Weight loss: His appetite is improved but his weight has been down.  Could be related to diarrhea as well as fluid loss.  We have discussed strategies to boost nutritional  status.  9.  Left lower extremity edema: Resolved currently.  10.  Pulmonary nodules and pleural effusion: Status post thoracentesis with cytology is pending.  This is likely malignant in etiology and will require anticancer treatment which she is currently receiving.  Surgical management of the pleural effusion may be required for current accumulation is noted.  11.  Diarrhea: Related to Jevtana chemotherapy.  Discussed strategies using Imodium and potentially Lomotil to combat these symptoms.  12.  Followup: In 3 weeks for the next cycle of therapy.   30  minutes were dedicated to this visit.  The time was spent on updating his disease status, addressing complications related to his cancer and cancer therapy.   Zola Button, MD 6/7/20218:59 AM

## 2019-11-29 LAB — PROSTATE-SPECIFIC AG, SERUM (LABCORP)

## 2019-11-30 ENCOUNTER — Inpatient Hospital Stay: Payer: Medicare Other

## 2019-11-30 ENCOUNTER — Other Ambulatory Visit: Payer: Self-pay

## 2019-11-30 VITALS — BP 128/70 | HR 78 | Temp 98.2°F | Resp 19

## 2019-11-30 DIAGNOSIS — Z5111 Encounter for antineoplastic chemotherapy: Secondary | ICD-10-CM | POA: Diagnosis not present

## 2019-11-30 DIAGNOSIS — C61 Malignant neoplasm of prostate: Secondary | ICD-10-CM

## 2019-11-30 LAB — CULTURE, BODY FLUID W GRAM STAIN -BOTTLE: Culture: NO GROWTH

## 2019-11-30 MED ORDER — PEGFILGRASTIM-JMDB 6 MG/0.6ML ~~LOC~~ SOSY
6.0000 mg | PREFILLED_SYRINGE | Freq: Once | SUBCUTANEOUS | Status: AC
  Administered 2019-11-30: 6 mg via SUBCUTANEOUS

## 2019-11-30 MED ORDER — PEGFILGRASTIM-JMDB 6 MG/0.6ML ~~LOC~~ SOSY
PREFILLED_SYRINGE | SUBCUTANEOUS | Status: AC
  Filled 2019-11-30: qty 0.6

## 2019-11-30 NOTE — Patient Instructions (Signed)

## 2019-12-01 ENCOUNTER — Other Ambulatory Visit: Payer: Self-pay | Admitting: Oncology

## 2019-12-01 ENCOUNTER — Telehealth: Payer: Self-pay

## 2019-12-01 MED ORDER — DIPHENOXYLATE-ATROPINE 2.5-0.025 MG PO TABS
1.0000 | ORAL_TABLET | Freq: Four times a day (QID) | ORAL | 0 refills | Status: DC | PRN
Start: 2019-12-01 — End: 2020-05-25

## 2019-12-01 NOTE — Telephone Encounter (Signed)
Patient made aware that Dr. Alen Blew sent a prescription for Lomotil to the pharmacy. Patient instructed to call the office with any further questions or concerns. Patient verbalized understanding.

## 2019-12-01 NOTE — Telephone Encounter (Signed)
-----   Message from [name], MD sent at 12/01/2019 11:09 AM EDT ----- I will send Rx to lomotil to use. Thanks ----- Message ----- From: Tami Lin, RN Sent: 12/01/2019  11:03 AM EDT To: [name], MD  Patient called and wants to make you aware that he continues to have diarrhea as discussed at [facility] visit and Imodium does not seem to help. I asked him what he has been eating and he said he ate oatmeal this morning and Brendolyn Patty last night. He has been pushing fluids. Last treatment was on Monday 6/7 D1 C2 Jevtana.  Lanelle Bal

## 2019-12-05 ENCOUNTER — Other Ambulatory Visit: Payer: Self-pay | Admitting: Oncology

## 2019-12-05 ENCOUNTER — Telehealth: Payer: Self-pay

## 2019-12-05 ENCOUNTER — Other Ambulatory Visit: Payer: Self-pay

## 2019-12-05 ENCOUNTER — Ambulatory Visit: Payer: Medicare Other | Admitting: Pulmonary Disease

## 2019-12-05 ENCOUNTER — Encounter: Payer: Self-pay | Admitting: Pulmonary Disease

## 2019-12-05 VITALS — BP 126/82 | HR 84 | Temp 97.9°F | Ht 65.0 in | Wt 150.0 lb

## 2019-12-05 DIAGNOSIS — C7800 Secondary malignant neoplasm of unspecified lung: Secondary | ICD-10-CM

## 2019-12-05 DIAGNOSIS — C61 Malignant neoplasm of prostate: Secondary | ICD-10-CM

## 2019-12-05 DIAGNOSIS — R0602 Shortness of breath: Secondary | ICD-10-CM | POA: Diagnosis not present

## 2019-12-05 DIAGNOSIS — J90 Pleural effusion, not elsewhere classified: Secondary | ICD-10-CM | POA: Diagnosis not present

## 2019-12-05 MED ORDER — OPIUM 10 MG/ML (1%) PO TINC: 6.0000 mg | ORAL | 0 refills | Status: DC | PRN

## 2019-12-05 NOTE — Telephone Encounter (Signed)
-----   Message from [name], MD sent at 12/05/2019 11:53 AM EDT ----- Regarding: RE: Patient question New Rx for Opium tincture was sent to his pharmacy.  If he cannot get this medication, then he is to continue Imodium alone and continue with aggressive hydration. Thanks ----- Message ----- From: Teodoro Spray, RN Sent: 12/05/2019  10:06 AM EDT To: [name], MD Subject: RE: Patient question                           Patient states he has been taking the lomotil every 4 hours as prescribed.  Patient has been drinking gatorade and water, and has tried to eat, but states he has to use the bathroom soon after.  Patient wants to know if he should continue taking the lomotil or try something else.  Please advise.  ----- Message ----- From: [name], MD Sent: 12/05/2019   9:59 AM EDT To: Teodoro Spray, RN Subject: RE: Patient question                           How many trips to the bathroom? Is he taking imodium? If so how many? Is he eating or drinking?  He needs to take imodium after each lose bowel movent. He need to continue with oral hydration.  Thanks ----- Message ----- From: Teodoro Spray, RN Sent: 12/05/2019   9:19 AM EDT To: [name], MD Subject: Patient question                               Patient called office stating he still has been having diarrhea.  Patient had called the office on 12/01/19 with same complaint and was prescribed lomotil.  Patient states the pharmacy only gave him 18 tabs due to the medication not being covered by insurance. Patient states the lomotil did not help. Please advise.

## 2019-12-05 NOTE — Progress Notes (Signed)
[name] Mathews    [id number]    July 20, 1945  Primary Care Physician:Fletcher, Edison Nasuti, MD  Referring Physician: Guadalupe Dawn, MD [address],  [place] [address]  Chief complaint:   Shortness of breath Just recently started treatment for his metastatic prostate cancer has had 2 courses of treatment  HPI:  Recently had thoracentesis for about 900 cc of fluid, showed just reactive cells  History of metastatic prostate cancer with lymphangitic spread, bilateral pleural effusions  Shortness of breath with activity  Remote smoking history quit in 1972 less than half a pack a day for less than 5 to 7 years  With any kind of activity Productive cough, clear phlegm, no fevers or chills  He had some weight loss, decreased appetite  Therapy since July 2020  Denies significant leg swellings at present  Still limited with activity related to shortness of breath  Outpatient Encounter Medications as of 12/05/2019  Medication Sig  . albuterol (VENTOLIN HFA) 108 (90 Base) MCG/ACT inhaler Inhale 1-2 puffs into the lungs every 6 (six) hours as needed for wheezing or shortness of breath.  [name] atorvastatin (LIPITOR) 20 MG tablet Take 1 tablet (20 mg total) by mouth daily.  [name] denosumab (XGEVA) 120 MG/1.7ML SOLN Inject 120 mg into the skin every 30 (thirty) days.  . diphenoxylate-atropine (LOMOTIL) 2.5-0.025 MG tablet Take 1 tablet by mouth 4 (four) times daily as needed for diarrhea or loose stools.  [name] EPIPEN 2-PAK 0.3 MG/0.3ML SOAJ injection Inject 0.3 mg into the muscle as needed for anaphylaxis.   [name] gabapentin (NEURONTIN) 300 MG capsule Take 1 capsule (300 mg total) by mouth at bedtime.  . hydrochlorothiazide (HYDRODIURIL) 25 MG tablet Take 1 tablet (25 mg total) by mouth daily.  [name] HYDROcodone-homatropine (HYCODAN) 5-1.5 MG/5ML syrup Take 5 mLs by mouth every 6 (six) hours as needed for cough.  [name] leuprolide (LUPRON) 30 MG injection Inject 30 mg into the muscle every 4  (four) months.  . Opium 10 MG/ML (1%) TINC Take 0.6 mLs (6 mg total) by mouth every 4 (four) hours as needed for diarrhea or loose stools.  . prochlorperazine (COMPAZINE) 10 MG tablet TAKE 1 TABLET BY MOUTH EVERY 6 HOURS AS NEEDED FOR NAUSEA FOR VOMITING (Patient taking differently: Take 10 mg by mouth every 6 (six) hours as needed for nausea or vomiting. )  . rOPINIRole (REQUIP) 2 MG tablet Take 1 tablet (2 mg total) by mouth at bedtime.  . [DISCONTINUED] HYDROcodone-acetaminophen (NORCO) 5-325 MG tablet Take 1 tablet by mouth every 6 (six) hours as needed for moderate pain.  . [DISCONTINUED] megestrol (MEGACE) 400 MG/10ML suspension Take 10 mLs (400 mg total) by mouth daily.   Facility-Administered Encounter Medications as of 12/05/2019  Medication  . sodium chloride flush (NS) 0.9 % injection 10 mL    Allergies as of 12/05/2019 - Review Complete 12/05/2019  Allergen Reaction Noted  . Bee venom Anaphylaxis, Shortness Of Breath, and Swelling 01/04/2016  . Wasp venom Anaphylaxis, Shortness Of Breath, and Swelling 01/04/2016  . Percocet [oxycodone-acetaminophen] Palpitations 11/04/2013    Past Medical History:  Diagnosis Date  . Hyperlipidemia   . Hypertension   . Prostate cancer ([place])    metastasis bones  . Restless leg syndrome     Past Surgical History:  Procedure Laterality Date  . IR CV LINE INJECTION  02/15/2019  . IR IMAGING GUIDED PORT INSERTION  01/11/2019  . IR THORACENTESIS ASP PLEURAL SPACE W/IMG GUIDE  11/25/2019  . PELVIC LYMPH NODE DISSECTION     prostate remains intact    Family History  Problem Relation Age of Onset  . Heart attack Mother 79       died of heart attack  . Cervical cancer Sister 67  . Stroke Brother 51    Social History   Socioeconomic History  . Marital status: Widowed    Spouse name: Not on file  . Number of children: Not on file  . Years of education: Not on file  . Highest education level: Not on file  Occupational History  .  Occupation: retired Chemical engineer: [employer]    Comment: retired  Tobacco Use  . Smoking status: Former Smoker    Packs/day: 0.25    Years: 6.00    Pack years: 1.50    Start date: 1969    Quit date: 1975    Years since quitting: 46.4  . Smokeless tobacco: Never Used  Vaping Use  . Vaping Use: Never used  Substance and Sexual Activity  . Alcohol use: No  . Drug use: No  . Sexual activity: Never  Other Topics Concern  . Not on file  Social History Narrative   Walks about 0.5 a day. Play a lot of golf. Widowed. Surrounded by his children and grandchildren. Has a Careers adviser, Felix Ahmadi.   Social Determinants of Health   Financial Resource Strain:   . Difficulty of Paying Living Expenses:   Food Insecurity:   . Worried About Charity fundraiser in the Last Year:   . Arboriculturist in the Last Year:   Transportation Needs:   . Film/video editor (Medical):   [name] Lack of Transportation (Non-Medical):   Physical Activity:   . Days of Exercise per Week:   . Minutes of Exercise per Session:   Stress:   . Feeling of Stress :   Social Connections:   . Frequency of Communication with Friends and Family:   . Frequency of Social Gatherings with Friends and Family:   . Attends Religious Services:   . Active Member of Clubs or Organizations:   . Attends Archivist Meetings:   [name] Marital Status:   Intimate Partner Violence:   . Fear of Current or Ex-Partner:   . Emotionally Abused:   [name] Physically Abused:   . Sexually Abused:     Review of Systems  Constitutional: Negative for fatigue and fever.  Respiratory: Positive for cough and shortness of breath.   Cardiovascular: Negative for chest pain and leg swelling.  Gastrointestinal: Negative.     Vitals:   12/05/19 1219  BP: 126/82  Pulse: 84  Temp: 97.9 F (36.6 C)  SpO2: 93%     Physical Exam Constitutional:      Appearance: He is well-developed.  Eyes:     Pupils: Pupils are equal,  round, and reactive to light.  Neck:     Thyroid: No thyromegaly.     Trachea: No tracheal deviation.  Cardiovascular:     Rate and Rhythm: Normal rate and regular rhythm.  Pulmonary:     Effort: Pulmonary effort is normal. No respiratory distress.     Breath sounds: No wheezing or rales.     Comments: Decreased air entry right base Chest:     Chest wall: No tenderness.  Musculoskeletal:     Cervical back: Normal range of motion and neck supple.  Neurological:     Mental Status:  He is alert.      Data Reviewed: CT scan of the chest reviewed with the patient showing bilateral pleural effusion, findings on the CT scan consistent with lymphangitic spread, bilateral pleural effusions  Chest x-ray post thoracentesis still shows some residual effusion  Assessment:  Worsening shortness of breath Metastatic prostate cancer Bilateral pleural effusions Shortness of breath  Ongoing metastasis likely contributing to his shortness of breath  May have reaccumulation of pleural fluid as well over time Has just recently started new therapy, hopefully this helps his lymphangitic spread and also reduces reaccumulation of pleural fluid  Plan/Recommendations: Continue lines of care  Graded exercises as tolerated  I will follow-up with him in about 3 months  Encouraged to call with any significant concerns  Sherrilyn Rist MD [facility] 12/05/2019, 12:24 PM  CC: Guadalupe Dawn, MD

## 2019-12-05 NOTE — Telephone Encounter (Signed)
See message below.  Called and informed patient of prescription sent to his pharmacy. Informed patient to notify office if he had any additional questions or concerns, or if diarrhea did not subside.  All questions were answered during this phone call.

## 2019-12-05 NOTE — Patient Instructions (Signed)
Continue with graded exercise as tolerated  Call us if you feel you are getting more short of breath We may need to repeat a chest x-ray and if there is significant fluid may need drained  We have to give the medication that you just started some time to see what the effect is   Call at any time if you are not feeling well  I will see you in about 3 months

## 2019-12-07 ENCOUNTER — Telehealth: Payer: Self-pay | Admitting: *Deleted

## 2019-12-07 NOTE — Telephone Encounter (Signed)
FYI Received notification of denial of Opium 10 mg/ml 1% Tincture per [employer].

## 2019-12-20 ENCOUNTER — Telehealth: Payer: Self-pay

## 2019-12-20 ENCOUNTER — Inpatient Hospital Stay: Payer: Medicare Other | Admitting: Oncology

## 2019-12-20 ENCOUNTER — Inpatient Hospital Stay: Payer: Medicare Other

## 2019-12-20 ENCOUNTER — Other Ambulatory Visit: Payer: Self-pay

## 2019-12-20 VITALS — BP 143/76 | HR 96 | Temp 97.8°F | Resp 17 | Ht 65.0 in | Wt 146.6 lb

## 2019-12-20 DIAGNOSIS — Z95828 Presence of other vascular implants and grafts: Secondary | ICD-10-CM

## 2019-12-20 DIAGNOSIS — C61 Malignant neoplasm of prostate: Secondary | ICD-10-CM

## 2019-12-20 DIAGNOSIS — Z5111 Encounter for antineoplastic chemotherapy: Secondary | ICD-10-CM | POA: Diagnosis not present

## 2019-12-20 LAB — CMP (CANCER CENTER ONLY)
ALT: 11 U/L (ref 0–44)
AST: 18 U/L (ref 15–41)
Albumin: 2.6 g/dL — ABNORMAL LOW (ref 3.5–5.0)
Alkaline Phosphatase: 82 U/L (ref 38–126)
Anion gap: 10 (ref 5–15)
BUN: 10 mg/dL (ref 8–23)
CO2: 23 mmol/L (ref 22–32)
Calcium: 8.2 mg/dL — ABNORMAL LOW (ref 8.9–10.3)
Chloride: 109 mmol/L (ref 98–111)
Creatinine: 0.74 mg/dL (ref 0.61–1.24)
GFR, Est AFR Am: >60 mL/min (ref >60)
GFR, Estimated: >60 mL/min (ref >60)
Glucose, Bld: 134 mg/dL — ABNORMAL HIGH (ref 70–99)
Potassium: 3.5 mmol/L (ref 3.5–5.1)
Sodium: 142 mmol/L (ref 135–145)
Total Bilirubin: 0.3 mg/dL (ref 0.3–1.2)
Total Protein: 6.4 g/dL — ABNORMAL LOW (ref 6.5–8.1)

## 2019-12-20 LAB — CBC WITH DIFFERENTIAL (CANCER CENTER ONLY)
Abs Immature Granulocytes: 0.03 10*3/uL (ref 0.00–0.07)
Basophils Absolute: 0 10*3/uL (ref 0.0–0.1)
Basophils Relative: 0 %
Eosinophils Absolute: 0 10*3/uL (ref 0.0–0.5)
Eosinophils Relative: 1 %
HCT: 36.6 % — ABNORMAL LOW (ref 39.0–52.0)
Hemoglobin: 11 g/dL — ABNORMAL LOW (ref 13.0–17.0)
Immature Granulocytes: 0 %
Lymphocytes Relative: 9 %
Lymphs Abs: 0.7 10*3/uL (ref 0.7–4.0)
MCH: 23.4 pg — ABNORMAL LOW (ref 26.0–34.0)
MCHC: 30.1 g/dL (ref 30.0–36.0)
MCV: 77.9 fL — ABNORMAL LOW (ref 80.0–100.0)
Monocytes Absolute: 0.5 10*3/uL (ref 0.1–1.0)
Monocytes Relative: 7 %
Neutro Abs: 6.1 10*3/uL (ref 1.7–7.7)
Neutrophils Relative %: 83 %
Platelet Count: 336 10*3/uL (ref 150–400)
RBC: 4.7 MIL/uL (ref 4.22–5.81)
RDW: 22 % — ABNORMAL HIGH (ref 11.5–15.5)
WBC Count: 7.4 10*3/uL (ref 4.0–10.5)
nRBC: 0 % (ref 0.0–0.2)

## 2019-12-20 MED ORDER — FAMOTIDINE IN NACL 20-0.9 MG/50ML-% IV SOLN
20.0000 mg | Freq: Once | INTRAVENOUS | Status: AC
  Administered 2019-12-20: 20 mg via INTRAVENOUS

## 2019-12-20 MED ORDER — DENOSUMAB 120 MG/1.7ML ~~LOC~~ SOLN
120.0000 mg | Freq: Once | SUBCUTANEOUS | Status: AC
  Administered 2019-12-20: 120 mg via SUBCUTANEOUS

## 2019-12-20 MED ORDER — DIPHENHYDRAMINE HCL 50 MG/ML IJ SOLN
25.0000 mg | Freq: Once | INTRAMUSCULAR | Status: AC
  Administered 2019-12-20: 25 mg via INTRAVENOUS

## 2019-12-20 MED ORDER — SODIUM CHLORIDE 0.9% FLUSH
10.0000 mL | INTRAVENOUS | Status: DC | PRN
  Administered 2019-12-20: 10 mL
  Filled 2019-12-20: qty 10

## 2019-12-20 MED ORDER — FAMOTIDINE IN NACL 20-0.9 MG/50ML-% IV SOLN
INTRAVENOUS | Status: AC
  Filled 2019-12-20: qty 50

## 2019-12-20 MED ORDER — SODIUM CHLORIDE 0.9 % IV SOLN
20.0000 mg/m2 | Freq: Once | INTRAVENOUS | Status: AC
  Administered 2019-12-20: 36 mg via INTRAVENOUS
  Filled 2019-12-20: qty 3.6

## 2019-12-20 MED ORDER — DENOSUMAB 120 MG/1.7ML ~~LOC~~ SOLN
SUBCUTANEOUS | Status: AC
  Filled 2019-12-20: qty 1.7

## 2019-12-20 MED ORDER — SODIUM CHLORIDE 0.9 % IV SOLN
Freq: Once | INTRAVENOUS | Status: AC
  Filled 2019-12-20: qty 250

## 2019-12-20 MED ORDER — HEPARIN SOD (PORK) LOCK FLUSH 100 UNIT/ML IV SOLN
500.0000 [IU] | Freq: Once | INTRAVENOUS | Status: AC | PRN
  Administered 2019-12-20: 500 [IU]
  Filled 2019-12-20: qty 5

## 2019-12-20 MED ORDER — DIPHENHYDRAMINE HCL 50 MG/ML IJ SOLN
INTRAMUSCULAR | Status: AC
  Filled 2019-12-20: qty 1

## 2019-12-20 MED ORDER — SODIUM CHLORIDE 0.9 % IV SOLN
10.0000 mg | Freq: Once | INTRAVENOUS | Status: AC
  Administered 2019-12-20: 10 mg via INTRAVENOUS
  Filled 2019-12-20: qty 10

## 2019-12-20 NOTE — Progress Notes (Signed)
Hematology and Oncology Follow Up Visit  [name] Mathews [id number] 1946-03-05 74 y.o. 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  He was initially diagnosed in 2001 with Gleason score of 4+5 = 9 and advanced disease at that time.   Prior Therapy: 1. He was started on Lupron after attempted prostatectomy in 2001.    2. 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  3. He is status post radiation therapy to the prostate fossa completed in December 2018. He received 52.5 gr 20 fractions.  4. Zytiga 1000 mg po daily with prednisone 5 mg started in 09/2011.   5. Taxotere chemotherapy 75 mg per metered square with cycle 1 started on 01/19/2019.  His dose was reduced to 60 mg per metered square starting with cycle 8 of therapy.  He is status post 11 cycles of therapy.  Current therapy:   Xgeva 120 mg started on 10/31/2011.  He will receive every 6 weeks.   Eligard every 4 months.  Last dose given in May and will be repeated in September 2021.  Jevtana chemotherapy 20 mg per metered square with cycle 1 given on Nov 08, 2019.  He is here for cycle 3 of therapy.    Interim History: [name] Mathews is here for return evaluation.  Since the last visit, he received the last cycle of chemotherapy with few complaints.  He had issues with loose bowel movements and diarrhea that was not manageable with Imodium.  He was not able to control with Lomotil.  He subsequently reported improvement in his diarrhea at this time.  He is eating better although he still has some dyspnea on exertion.  He denies any excessive fatigue tiredness but is limited in his mobility because of respiratory status.         Medications: Reviewed without changes. Current Outpatient Medications  Medication Sig Dispense Refill  . albuterol (VENTOLIN HFA) 108 (90 Base)  MCG/ACT inhaler Inhale 1-2 puffs into the lungs every 6 (six) hours as needed for wheezing or shortness of breath. 18 g 0  . atorvastatin (LIPITOR) 20 MG tablet Take 1 tablet (20 mg total) by mouth daily. 90 tablet 3  . denosumab (XGEVA) 120 MG/1.7ML SOLN Inject 120 mg into the skin every 30 (thirty) days.    . diphenoxylate-atropine (LOMOTIL) 2.5-0.025 MG tablet Take 1 tablet by mouth 4 (four) times daily as needed for diarrhea or loose stools. 30 tablet 0  . EPIPEN 2-PAK 0.3 MG/0.3ML SOAJ injection Inject 0.3 mg into the muscle as needed for anaphylaxis.     [name] gabapentin (NEURONTIN) 300 MG capsule Take 1 capsule (300 mg total) by mouth at bedtime. 90 capsule 3  . hydrochlorothiazide (HYDRODIURIL) 25 MG tablet Take 1 tablet (25 mg total) by mouth daily. 90 tablet 3  . HYDROcodone-homatropine (HYCODAN) 5-1.5 MG/5ML syrup Take 5 mLs by mouth every 6 (six) hours as needed for cough. 120 mL 0  . leuprolide (LUPRON) 30 MG injection Inject 30 mg into the muscle every 4 (four) months.    . Opium 10 MG/ML (1%) TINC Take 0.6 mLs (6 mg total) by mouth every 4 (four) hours as needed for diarrhea or loose stools. (Patient not taking: Reported on 12/07/2019) 118 mL 0  . prochlorperazine (COMPAZINE) 10 MG tablet TAKE 1 TABLET BY MOUTH EVERY 6 HOURS AS NEEDED FOR NAUSEA FOR VOMITING (Patient  taking differently: Take 10 mg by mouth every 6 (six) hours as needed for nausea or vomiting. ) 30 tablet 3  . rOPINIRole (REQUIP) 2 MG tablet Take 1 tablet (2 mg total) by mouth at bedtime. 90 tablet 2   No current facility-administered medications for this visit.   Facility-Administered Medications Ordered in Other Visits  Medication Dose Route Frequency Provider Last Rate Last Admin  . sodium chloride flush (NS) 0.9 % injection 10 mL  10 mL Intracatheter PRN [name], MD   10 mL at 09/07/19 0954  . sodium chloride flush (NS) 0.9 % injection 10 mL  10 mL Intracatheter PRN [name], MD   10 mL at 12/20/19 1607      Allergies:  Allergies  Allergen Reactions  . Bee Venom Anaphylaxis, Shortness Of Breath and Swelling    Tongue swelling.   . Wasp Venom Anaphylaxis, Shortness Of Breath and Swelling    Tongue swelling   . Percocet [Oxycodone-Acetaminophen] Palpitations       Physical Exam:       ECOG: 2    General appearance: Comfortable appearing without any discomfort Head: Normocephalic without any trauma Oropharynx: Mucous membranes are moist and pink without any thrush or ulcers. Eyes: Pupils are equal and round reactive to light. Lymph nodes: No cervical, supraclavicular, inguinal or axillary lymphadenopathy.   Heart:regular rate and rhythm.  S1 and S2 without leg edema. Lung: Clear without any rhonchi or wheezes.  Decreased breath sounds at bases. Abdomin: Soft, nontender, nondistended with good bowel sounds.  No hepatosplenomegaly. Musculoskeletal: No joint deformity or effusion.  Full range of motion noted. Neurological: No deficits noted on motor, sensory and deep tendon reflex exam. Skin: No petechial rash or dryness.  Appeared moist.                     Lab Results: Lab Results  Component Value Date   WBC 12.2 (H) 11/28/2019   HGB 10.6 (L) 11/28/2019   HCT 34.9 (L) 11/28/2019   MCV 76.4 (L) 11/28/2019   PLT 282 11/28/2019     Chemistry      Component Value Date/Time   NA 141 11/28/2019 0850   NA 141 05/20/2017 1425   K 4.1 11/28/2019 0850   K 4.3 05/20/2017 1425   CL 105 11/28/2019 0850   CL 105 12/08/2012 1504   CO2 25 11/28/2019 0850   CO2 31 (H) 05/20/2017 1425   BUN 14 11/28/2019 0850   BUN 17.8 05/20/2017 1425   CREATININE 0.72 11/28/2019 0850   CREATININE 0.9 05/20/2017 1425      Component Value Date/Time   CALCIUM 8.2 (L) 11/28/2019 0850   CALCIUM 9.4 05/20/2017 1425   ALKPHOS 113 11/28/2019 0850   ALKPHOS 72 05/20/2017 1425   AST 52 (H) 11/28/2019 0850   AST 15 05/20/2017 1425   ALT 9 11/28/2019 0850   ALT <6 05/20/2017  1425   BILITOT 0.5 11/28/2019 0850   BILITOT 0.25 05/20/2017 1425               Impression and Plan:   74 year old man with:    1.  Castration-resistant prostate cancer with disease to the bone diagnosed in 2013.    He is currently on Jevtana chemotherapy without any major complications.  He did have loose bowel habits but have stopped at this time.  Risks and benefits of proceeding with this treatment were reviewed.  Complications include nausea, vomiting and worsening diarrhea were reiterated.  He understands that without salvage therapy his cancer would likely continues to progress and might be more debilitated.  He is agreeable to continue at this time.  2. Bone directed therapy: He is currently on Xgeva which will be continued for the time being.  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  3. Androgen deprivation: He will receive Eligard in September and every 4 months after that.   5.  IV access: Port-A-Cath remains in use without any issues.  6.  Prognosis and goals of care: His disease is incurable although aggressive measures are warranted given his reasonable performance status.  7.  Flank pain and back pain: Manageable at this time.  8.  Weight loss: He is eating better at this time although his weight is down slightly.  He continues to monitor this at this time.  9.  Diarrhea: Could be related to chemotherapy.  We will continue to monitor closely and we have discussed strategies to improve the symptoms.  He is to continue Imodium and use Lomotil as needed.  Dose reduction of Jevtana could be considered in the future.  10.  Pulmonary nodules and pleural effusion: Unclear etiology it could be malignant in nature.  Cytology did not show any evidence of malignancy.  He does have decrease in his pulmonary capacity and will assess for need for oxygen at home.  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   11.  Followup: He will return in 3 weeks for subsequent cycles of therapy.   30  minutes were spent on this encounter.  Time was dedicated to reviewing his disease status, reviewing laboratory data and assessing future plan of care review.   Zola Button, MD 6/29/20218:30 AM

## 2019-12-20 NOTE — Patient Instructions (Signed)
[facility] Discharge Instructions for Patients Receiving Chemotherapy  Today you received the following chemotherapy agents: cabazitaxel.  To help prevent nausea and vomiting after your treatment, we encourage you to take your nausea medication as directed.   If you develop nausea and vomiting that is not controlled by your nausea medication, call the clinic.   BELOW ARE SYMPTOMS THAT SHOULD BE REPORTED IMMEDIATELY:  *FEVER GREATER THAN 100.5 F  *CHILLS WITH OR WITHOUT FEVER  NAUSEA AND VOMITING THAT IS NOT CONTROLLED WITH YOUR NAUSEA MEDICATION  *UNUSUAL SHORTNESS OF BREATH  *UNUSUAL BRUISING OR BLEEDING  TENDERNESS IN MOUTH AND THROAT WITH OR WITHOUT PRESENCE OF ULCERS  *URINARY PROBLEMS  *BOWEL PROBLEMS  UNUSUAL RASH Items with * indicate a potential emergency and should be followed up as soon as possible.  Feel free to call the clinic should you have any questions or concerns. The clinic phone number is [phone].  Please show the CHEMO ALERT CARD at check-in to the Emergency Department and triage nurse.   

## 2019-12-20 NOTE — Telephone Encounter (Signed)
Order for home O2 faxed to [place]. Confirmation fax received.

## 2019-12-20 NOTE — Progress Notes (Signed)
SATURATION QUALIFICATIONS: (This note is used to comply with regulatory documentation for home oxygen)  Patient Saturations on Room Air at Rest = 92%  Patient Saturations on Room Air while Ambulating = 84%  Patient Saturations on 2 Liters of oxygen while Ambulating = 90%  Please briefly explain why patient needs home oxygen: Per physician note on 12/20/19: Patient has decrease in his pulmonary capacity and will assess for need for oxygen at home.

## 2019-12-21 ENCOUNTER — Other Ambulatory Visit: Payer: Self-pay | Admitting: Medical Oncology

## 2019-12-21 LAB — PROSTATE-SPECIFIC AG, SERUM (LABCORP): Prostate Specific Ag, Serum: 228 ng/mL — ABNORMAL HIGH (ref 0.0–4.0)

## 2019-12-21 NOTE — Progress Notes (Signed)
SATURATION QUALIFICATIONS: (This note is used to comply with regulatory documentation for home oxygen)  Please briefly explain why patient need home oxygen: Other methods have been tried (ie: pursed lip breathing and relaxation) but have not been successful.

## 2019-12-21 NOTE — Telephone Encounter (Signed)
Received fax from Gasper Sells with [facility] requesting clarification of prescribed liter flow and also documentation that states other treatments have been tried but have not been successful. Information faxed back to [place] at [phone]. Receipt of fax confirmed.

## 2019-12-22 ENCOUNTER — Inpatient Hospital Stay: Payer: Medicare Other | Attending: Oncology

## 2019-12-22 ENCOUNTER — Other Ambulatory Visit: Payer: Self-pay

## 2019-12-22 ENCOUNTER — Telehealth: Payer: Self-pay | Admitting: Oncology

## 2019-12-22 VITALS — BP 132/86 | Resp 18

## 2019-12-22 DIAGNOSIS — Z5111 Encounter for antineoplastic chemotherapy: Secondary | ICD-10-CM | POA: Diagnosis present

## 2019-12-22 DIAGNOSIS — C61 Malignant neoplasm of prostate: Secondary | ICD-10-CM | POA: Diagnosis present

## 2019-12-22 DIAGNOSIS — C7951 Secondary malignant neoplasm of bone: Secondary | ICD-10-CM | POA: Diagnosis present

## 2019-12-22 DIAGNOSIS — Z5189 Encounter for other specified aftercare: Secondary | ICD-10-CM | POA: Diagnosis not present

## 2019-12-22 MED ORDER — PEGFILGRASTIM-JMDB 6 MG/0.6ML ~~LOC~~ SOSY
PREFILLED_SYRINGE | SUBCUTANEOUS | Status: AC
  Filled 2019-12-22: qty 0.6

## 2019-12-22 MED ORDER — PEGFILGRASTIM-JMDB 6 MG/0.6ML ~~LOC~~ SOSY
6.0000 mg | PREFILLED_SYRINGE | Freq: Once | SUBCUTANEOUS | Status: AC
  Administered 2019-12-22: 6 mg via SUBCUTANEOUS

## 2019-12-22 NOTE — Telephone Encounter (Signed)
Scheduled per los, patient has been called and notified. 

## 2019-12-22 NOTE — Patient Instructions (Signed)

## 2020-01-10 ENCOUNTER — Inpatient Hospital Stay: Payer: Medicare Other | Admitting: Oncology

## 2020-01-10 ENCOUNTER — Inpatient Hospital Stay: Payer: Medicare Other

## 2020-01-10 ENCOUNTER — Other Ambulatory Visit: Payer: Self-pay

## 2020-01-10 VITALS — BP 131/78 | HR 81 | Temp 97.7°F | Resp 18 | Ht 65.0 in | Wt 144.7 lb

## 2020-01-10 DIAGNOSIS — C61 Malignant neoplasm of prostate: Secondary | ICD-10-CM

## 2020-01-10 DIAGNOSIS — Z95828 Presence of other vascular implants and grafts: Secondary | ICD-10-CM

## 2020-01-10 DIAGNOSIS — Z5111 Encounter for antineoplastic chemotherapy: Secondary | ICD-10-CM | POA: Diagnosis not present

## 2020-01-10 LAB — CMP (CANCER CENTER ONLY)
ALT: 6 U/L (ref 0–44)
AST: 19 U/L (ref 15–41)
Albumin: 2.6 g/dL — ABNORMAL LOW (ref 3.5–5.0)
Alkaline Phosphatase: 75 U/L (ref 38–126)
Anion gap: 7 (ref 5–15)
BUN: 14 mg/dL (ref 8–23)
CO2: 30 mmol/L (ref 22–32)
Calcium: 8.8 mg/dL — ABNORMAL LOW (ref 8.9–10.3)
Chloride: 107 mmol/L (ref 98–111)
Creatinine: 0.73 mg/dL (ref 0.61–1.24)
GFR, Est AFR Am: >60 mL/min (ref >60)
GFR, Estimated: >60 mL/min (ref >60)
Glucose, Bld: 107 mg/dL — ABNORMAL HIGH (ref 70–99)
Potassium: 3.9 mmol/L (ref 3.5–5.1)
Sodium: 144 mmol/L (ref 135–145)
Total Bilirubin: 0.2 mg/dL — ABNORMAL LOW (ref 0.3–1.2)
Total Protein: 6.3 g/dL — ABNORMAL LOW (ref 6.5–8.1)

## 2020-01-10 LAB — CBC WITH DIFFERENTIAL (CANCER CENTER ONLY)
Abs Immature Granulocytes: 0.03 10*3/uL (ref 0.00–0.07)
Basophils Absolute: 0 10*3/uL (ref 0.0–0.1)
Basophils Relative: 0 %
Eosinophils Absolute: 0 10*3/uL (ref 0.0–0.5)
Eosinophils Relative: 0 %
HCT: 35.1 % — ABNORMAL LOW (ref 39.0–52.0)
Hemoglobin: 10.4 g/dL — ABNORMAL LOW (ref 13.0–17.0)
Immature Granulocytes: 0 %
Lymphocytes Relative: 12 %
Lymphs Abs: 1 10*3/uL (ref 0.7–4.0)
MCH: 23.9 pg — ABNORMAL LOW (ref 26.0–34.0)
MCHC: 29.6 g/dL — ABNORMAL LOW (ref 30.0–36.0)
MCV: 80.5 fL (ref 80.0–100.0)
Monocytes Absolute: 0.7 10*3/uL (ref 0.1–1.0)
Monocytes Relative: 8 %
Neutro Abs: 6.8 10*3/uL (ref 1.7–7.7)
Neutrophils Relative %: 80 %
Platelet Count: 298 10*3/uL (ref 150–400)
RBC: 4.36 MIL/uL (ref 4.22–5.81)
RDW: 23.9 % — ABNORMAL HIGH (ref 11.5–15.5)
WBC Count: 8.6 10*3/uL (ref 4.0–10.5)
nRBC: 0 % (ref 0.0–0.2)

## 2020-01-10 MED ORDER — SODIUM CHLORIDE 0.9 % IV SOLN
20.0000 mg/m2 | Freq: Once | INTRAVENOUS | Status: AC
  Administered 2020-01-10: 36 mg via INTRAVENOUS
  Filled 2020-01-10: qty 3.6

## 2020-01-10 MED ORDER — DIPHENHYDRAMINE HCL 50 MG/ML IJ SOLN
INTRAMUSCULAR | Status: AC
  Filled 2020-01-10: qty 1

## 2020-01-10 MED ORDER — DIPHENHYDRAMINE HCL 50 MG/ML IJ SOLN
25.0000 mg | Freq: Once | INTRAMUSCULAR | Status: AC
  Administered 2020-01-10: 25 mg via INTRAVENOUS

## 2020-01-10 MED ORDER — SODIUM CHLORIDE 0.9% FLUSH
10.0000 mL | INTRAVENOUS | Status: DC | PRN
  Administered 2020-01-10: 10 mL
  Filled 2020-01-10: qty 10

## 2020-01-10 MED ORDER — FAMOTIDINE IN NACL 20-0.9 MG/50ML-% IV SOLN
20.0000 mg | Freq: Once | INTRAVENOUS | Status: AC
  Administered 2020-01-10: 20 mg via INTRAVENOUS

## 2020-01-10 MED ORDER — HEPARIN SOD (PORK) LOCK FLUSH 100 UNIT/ML IV SOLN
500.0000 [IU] | Freq: Once | INTRAVENOUS | Status: AC | PRN
  Administered 2020-01-10: 500 [IU]
  Filled 2020-01-10: qty 5

## 2020-01-10 MED ORDER — FAMOTIDINE IN NACL 20-0.9 MG/50ML-% IV SOLN
INTRAVENOUS | Status: AC
  Filled 2020-01-10: qty 50

## 2020-01-10 MED ORDER — SODIUM CHLORIDE 0.9 % IV SOLN
10.0000 mg | Freq: Once | INTRAVENOUS | Status: AC
  Administered 2020-01-10: 10 mg via INTRAVENOUS
  Filled 2020-01-10: qty 10

## 2020-01-10 MED ORDER — SODIUM CHLORIDE 0.9 % IV SOLN
Freq: Once | INTRAVENOUS | Status: AC
  Filled 2020-01-10: qty 250

## 2020-01-10 NOTE — Progress Notes (Signed)
Hematology and Oncology Follow Up Visit  [name] Mathews [id number] Oct 21, 1945 74 y.o. 01/10/2020 8:09 AM    Principle Diagnosis: 74 year old man with advanced prostate cancer with disease to the bone diagnosed in 2013.  He has castration-resistant  after presenting with Gleason score of 4+5 = 9 in 2001.    Prior Therapy: 1. He was started on Lupron after attempted prostatectomy in 2001.    2. 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  3. He is status post radiation therapy to the prostate fossa completed in December 2018. He received 52.5 gr 20 fractions.  4. Zytiga 1000 mg po daily with prednisone 5 mg started in 09/2011.   5. Taxotere chemotherapy 75 mg per metered square with cycle 1 started on 01/19/2019.  His dose was reduced to 60 mg per metered square starting with cycle 8 of therapy.  He is status post 11 cycles of therapy.  Current therapy:   Xgeva 120 mg started on 10/31/2011.  He will receive every 6 weeks.   Eligard every 4 months.  Next injection will be in September 2021.  Jevtana chemotherapy 20 mg per metered square with cycle 1 given on Nov 08, 2019.  He is here for cycle 4 of therapy.    Interim History: [name] Mathews returns today for a follow-up visit.  Since the last visit, he reports no major changes in his health.  He has tolerated the last cycle of chemotherapy without any major complications.  He still has issues with maintaining weight although he is eating certain foods better at this time.  He does report regular bowel habits at times with occasional diarrhea.  Denies any abdominal pain fevers or chills.  He denies any bone pain pathological fractures.  His mobility is limited because of dyspnea and uses oxygen at times.  He is comfortable at rest and still lives independently and attends to certain activities of daily living.         Medications: Updated on review. Current Outpatient  Medications  Medication Sig Dispense Refill  . albuterol (VENTOLIN HFA) 108 (90 Base) MCG/ACT inhaler Inhale 1-2 puffs into the lungs every 6 (six) hours as needed for wheezing or shortness of breath. 18 g 0  . atorvastatin (LIPITOR) 20 MG tablet Take 1 tablet (20 mg total) by mouth daily. 90 tablet 3  . denosumab (XGEVA) 120 MG/1.7ML SOLN Inject 120 mg into the skin every 30 (thirty) days.    . diphenoxylate-atropine (LOMOTIL) 2.5-0.025 MG tablet Take 1 tablet by mouth 4 (four) times daily as needed for diarrhea or loose stools. 30 tablet 0  . EPIPEN 2-PAK 0.3 MG/0.3ML SOAJ injection Inject 0.3 mg into the muscle as needed for anaphylaxis.     [name] gabapentin (NEURONTIN) 300 MG capsule Take 1 capsule (300 mg total) by mouth at bedtime. 90 capsule 3  . hydrochlorothiazide (HYDRODIURIL) 25 MG tablet Take 1 tablet (25 mg total) by mouth daily. 90 tablet 3  . HYDROcodone-homatropine (HYCODAN) 5-1.5 MG/5ML syrup Take 5 mLs by mouth every 6 (six) hours as needed for cough. 120 mL 0  . leuprolide (LUPRON) 30 MG injection Inject 30 mg into the muscle every 4 (four) months.    . Opium 10 MG/ML (1%) TINC Take 0.6 mLs (6 mg total) by mouth every 4 (four) hours as needed for diarrhea or loose stools. (Patient not taking: Reported on 12/07/2019) 118 mL 0  . prochlorperazine (COMPAZINE) 10 MG tablet  TAKE 1 TABLET BY MOUTH EVERY 6 HOURS AS NEEDED FOR NAUSEA FOR VOMITING (Patient taking differently: Take 10 mg by mouth every 6 (six) hours as needed for nausea or vomiting. ) 30 tablet 3  . rOPINIRole (REQUIP) 2 MG tablet Take 1 tablet (2 mg total) by mouth at bedtime. 90 tablet 2   No current facility-administered medications for this visit.   Facility-Administered Medications Ordered in Other Visits  Medication Dose Route Frequency Provider Last Rate Last Admin  . sodium chloride flush (NS) 0.9 % injection 10 mL  10 mL Intracatheter PRN [name], MD   10 mL at 09/07/19 0954  . sodium chloride flush (NS) 0.9  % injection 10 mL  10 mL Intracatheter PRN [name], MD   10 mL at 01/10/20 4259     Allergies:  Allergies  Allergen Reactions  . Bee Venom Anaphylaxis, Shortness Of Breath and Swelling    Tongue swelling.   . Wasp Venom Anaphylaxis, Shortness Of Breath and Swelling    Tongue swelling   . Percocet [Oxycodone-Acetaminophen] Palpitations       Physical Exam:     Blood pressure 131/78, pulse 81, temperature 97.7 F (36.5 C), temperature source Temporal, resp. rate 18, height 5\' 5"  (1.651 m), weight 144 lb 11.2 oz (65.6 kg), SpO2 100 %.    ECOG: 2     General appearance: Alert, awake without any distress. Head: Atraumatic without abnormalities Oropharynx: Without any thrush or ulcers. Eyes: No scleral icterus. Lymph nodes: No lymphadenopathy noted in the cervical, supraclavicular, or axillary nodes Heart:regular rate and rhythm, without any murmurs or gallops.   Lung: Clear to auscultation scattered rhonchi noted bilaterally. Abdomin: Soft, nontender without any shifting dullness or ascites. Musculoskeletal: No clubbing or cyanosis. Neurological: No motor or sensory deficits. Skin: No rashes or lesions.                     Lab Results: Lab Results  Component Value Date   WBC 7.4 12/20/2019   HGB 11.0 (L) 12/20/2019   HCT 36.6 (L) 12/20/2019   MCV 77.9 (L) 12/20/2019   PLT 336 12/20/2019     Chemistry      Component Value Date/Time   NA 142 12/20/2019 0820   NA 141 05/20/2017 1425   K 3.5 12/20/2019 0820   K 4.3 05/20/2017 1425   CL 109 12/20/2019 0820   CL 105 12/08/2012 1504   CO2 23 12/20/2019 0820   CO2 31 (H) 05/20/2017 1425   BUN 10 12/20/2019 0820   BUN 17.8 05/20/2017 1425   CREATININE 0.74 12/20/2019 0820   CREATININE 0.9 05/20/2017 1425      Component Value Date/Time   CALCIUM 8.2 (L) 12/20/2019 0820   CALCIUM 9.4 05/20/2017 1425   ALKPHOS 82 12/20/2019 0820   ALKPHOS 72 05/20/2017 1425   AST 18 12/20/2019 0820    AST 15 05/20/2017 1425   ALT 11 12/20/2019 0820   ALT <6 05/20/2017 1425   BILITOT 0.3 12/20/2019 0820   BILITOT 0.25 05/20/2017 1425            Results for [name] Mathews, [name] Mathews (MRN [id number]) as of 01/10/2020 08:06  Ref. Range 12/20/2019 08:20  Prostate Specific Ag, Serum Latest Ref Range: 0.0 - 4.0 ng/mL 228.0 (H)     Impression and Plan:   74 year old man with:    1.  Advanced prostate cancer with disease to the bone after presenting initially with locally advanced disease in  2001.  He developed castration-resistant disease in 2013.  The natural course of this disease was outlined today and future treatment options were reiterated.  He is currently on Jevtana chemotherapy which could be his best option moving forward.  His PSA is currently pending from today but has been on the rise.  We will evaluate his response based on his current PSA future imaging studies.  He is agreeable to continue at this time.  He is experiencing some clinical benefit at this time although not dramatic.  Continue to monitor closely at this time.  2. Bone directed therapy: Last Xgeva given on June 29 and will be repeated in 3 weeks.  Complication clinic osteonecrosis of the jaw and hypocalcemia were reiterated he is agreeable to proceed.   3. Androgen deprivation: I recommended continuing this indefinitely.  His next Eligard will be in September 2021.   5.  IV access: Port-A-Cath currently in use without any issues.  6.  Prognosis and goals of care: Therapy remains palliative and his disease is incurable.  If no significant improvement with chemotherapy is noted, he could be approaching end-stage at this point proceeding with hospice would be a possibility.  7.  Flank pain and back pain: Resolved and he is no longer taking any pain medication.  8.  Weight loss: His weight is down a few pounds at this time.  We have discussed strategies to improve his nutritional intake including nutritional supplements  and certain foods.  9.  Diarrhea: Manageable at this time with antidiarrheal medication.  10.  Pulmonary considerations: Imaging studies in May 2021 showed multiple consolidations and pleural effusion.  This could represent lymphangitic spread of his malignancy.   11.  Followup: In 3 weeks for the next cycle of therapy.   30  minutes were dedicated to this visit.  The time was spent on reviewing his disease status, discussing treatment options and addressing complications related to his therapy and future plan of care.   Zola Button, MD 7/20/20218:09 AM

## 2020-01-10 NOTE — Patient Instructions (Signed)
[facility] Discharge Instructions for Patients Receiving Chemotherapy  Today you received the following chemotherapy agents Jevtana.  To help prevent nausea and vomiting after your treatment, we encourage you to take your nausea medication as directed.  If you develop nausea and vomiting that is not controlled by your nausea medication, call the clinic.   BELOW ARE SYMPTOMS THAT SHOULD BE REPORTED IMMEDIATELY:  *FEVER GREATER THAN 100.5 F  *CHILLS WITH OR WITHOUT FEVER  NAUSEA AND VOMITING THAT IS NOT CONTROLLED WITH YOUR NAUSEA MEDICATION  *UNUSUAL SHORTNESS OF BREATH  *UNUSUAL BRUISING OR BLEEDING  TENDERNESS IN MOUTH AND THROAT WITH OR WITHOUT PRESENCE OF ULCERS  *URINARY PROBLEMS  *BOWEL PROBLEMS  UNUSUAL RASH Items with * indicate a potential emergency and should be followed up as soon as possible.  Feel free to call the clinic you have any questions or concerns. The clinic phone number is [phone].  Please show the CHEMO ALERT CARD at check-in to the Emergency Department and triage nurse.    

## 2020-01-11 ENCOUNTER — Telehealth: Payer: Self-pay

## 2020-01-11 LAB — PROSTATE-SPECIFIC AG, SERUM (LABCORP): Prostate Specific Ag, Serum: 207 ng/mL — ABNORMAL HIGH (ref 0.0–4.0)

## 2020-01-11 NOTE — Telephone Encounter (Signed)
-----   Message from [name], MD sent at 01/11/2020  8:33 AM EDT ----- Please let him know his PSA is down

## 2020-01-11 NOTE — Telephone Encounter (Signed)
Called patient to make him aware of PSA result. No answer and voicemail is full. Will attempt to reach patient later today.

## 2020-01-12 ENCOUNTER — Other Ambulatory Visit: Payer: Self-pay

## 2020-01-12 ENCOUNTER — Inpatient Hospital Stay: Payer: Medicare Other

## 2020-01-12 VITALS — BP 132/80 | HR 90 | Resp 18

## 2020-01-12 DIAGNOSIS — Z5111 Encounter for antineoplastic chemotherapy: Secondary | ICD-10-CM | POA: Diagnosis not present

## 2020-01-12 DIAGNOSIS — C61 Malignant neoplasm of prostate: Secondary | ICD-10-CM

## 2020-01-12 MED ORDER — PEGFILGRASTIM-JMDB 6 MG/0.6ML ~~LOC~~ SOSY
PREFILLED_SYRINGE | SUBCUTANEOUS | Status: AC
  Filled 2020-01-12: qty 0.6

## 2020-01-12 MED ORDER — PEGFILGRASTIM-JMDB 6 MG/0.6ML ~~LOC~~ SOSY
6.0000 mg | PREFILLED_SYRINGE | Freq: Once | SUBCUTANEOUS | Status: AC
  Administered 2020-01-12: 6 mg via SUBCUTANEOUS

## 2020-01-12 NOTE — Patient Instructions (Signed)

## 2020-01-16 ENCOUNTER — Telehealth: Payer: Self-pay | Admitting: Family Medicine

## 2020-01-16 NOTE — Telephone Encounter (Signed)
Patient is currently scheduled for an initial CPAP follow up tomorrow with Amy at 930am. Patient has only had his machine for 14 days per [facility]. I called and left him a voicemail to call us back to get rescheduled. Will await his return call.

## 2020-01-17 ENCOUNTER — Ambulatory Visit: Payer: Self-pay | Admitting: Family Medicine

## 2020-01-18 ENCOUNTER — Ambulatory Visit: Payer: Medicare Other | Admitting: Family Medicine

## 2020-01-19 ENCOUNTER — Ambulatory Visit: Payer: Medicare Other | Admitting: Adult Health

## 2020-01-23 ENCOUNTER — Telehealth: Payer: Self-pay | Admitting: Pulmonary Disease

## 2020-01-23 MED ORDER — ALBUTEROL SULFATE HFA 108 (90 BASE) MCG/ACT IN AERS: 1.0000 | INHALATION_SPRAY | Freq: Four times a day (QID) | RESPIRATORY_TRACT | 2 refills | Status: AC | PRN

## 2020-01-23 NOTE — Telephone Encounter (Signed)
Called and spoke with Patient.  Patient requested a refill of Albuterol to be sent to [facility].  Requested prescription sent to requested pharmacy.  Nothing further at this time.

## 2020-01-31 ENCOUNTER — Ambulatory Visit: Payer: Self-pay | Admitting: Family Medicine

## 2020-01-31 ENCOUNTER — Inpatient Hospital Stay: Payer: Medicare Other | Attending: Oncology

## 2020-01-31 ENCOUNTER — Other Ambulatory Visit: Payer: Self-pay

## 2020-01-31 ENCOUNTER — Inpatient Hospital Stay: Payer: Medicare Other

## 2020-01-31 ENCOUNTER — Inpatient Hospital Stay: Payer: Medicare Other | Admitting: Oncology

## 2020-01-31 VITALS — BP 130/74 | HR 81 | Temp 98.0°F | Resp 18 | Wt 141.4 lb

## 2020-01-31 DIAGNOSIS — Z95828 Presence of other vascular implants and grafts: Secondary | ICD-10-CM

## 2020-01-31 DIAGNOSIS — D709 Neutropenia, unspecified: Secondary | ICD-10-CM | POA: Insufficient documentation

## 2020-01-31 DIAGNOSIS — Z5111 Encounter for antineoplastic chemotherapy: Secondary | ICD-10-CM | POA: Diagnosis present

## 2020-01-31 DIAGNOSIS — Z5189 Encounter for other specified aftercare: Secondary | ICD-10-CM | POA: Insufficient documentation

## 2020-01-31 DIAGNOSIS — C7951 Secondary malignant neoplasm of bone: Secondary | ICD-10-CM | POA: Diagnosis not present

## 2020-01-31 DIAGNOSIS — C61 Malignant neoplasm of prostate: Secondary | ICD-10-CM

## 2020-01-31 LAB — CMP (CANCER CENTER ONLY)
ALT: <6 U/L (ref 0–44)
AST: 23 U/L (ref 15–41)
Albumin: 2.6 g/dL — ABNORMAL LOW (ref 3.5–5.0)
Alkaline Phosphatase: 70 U/L (ref 38–126)
Anion gap: 8 (ref 5–15)
BUN: 14 mg/dL (ref 8–23)
CO2: 31 mmol/L (ref 22–32)
Calcium: 9.2 mg/dL (ref 8.9–10.3)
Chloride: 104 mmol/L (ref 98–111)
Creatinine: 0.71 mg/dL (ref 0.61–1.24)
GFR, Est AFR Am: >60 mL/min (ref >60)
GFR, Estimated: >60 mL/min (ref >60)
Glucose, Bld: 99 mg/dL (ref 70–99)
Potassium: 4.3 mmol/L (ref 3.5–5.1)
Sodium: 143 mmol/L (ref 135–145)
Total Bilirubin: 0.2 mg/dL — ABNORMAL LOW (ref 0.3–1.2)
Total Protein: 6.2 g/dL — ABNORMAL LOW (ref 6.5–8.1)

## 2020-01-31 LAB — CBC WITH DIFFERENTIAL (CANCER CENTER ONLY)
Abs Immature Granulocytes: 0.03 10*3/uL (ref 0.00–0.07)
Basophils Absolute: 0 10*3/uL (ref 0.0–0.1)
Basophils Relative: 0 %
Eosinophils Absolute: 0 10*3/uL (ref 0.0–0.5)
Eosinophils Relative: 0 %
HCT: 33.7 % — ABNORMAL LOW (ref 39.0–52.0)
Hemoglobin: 9.9 g/dL — ABNORMAL LOW (ref 13.0–17.0)
Immature Granulocytes: 0 %
Lymphocytes Relative: 9 %
Lymphs Abs: 0.9 10*3/uL (ref 0.7–4.0)
MCH: 23.9 pg — ABNORMAL LOW (ref 26.0–34.0)
MCHC: 29.4 g/dL — ABNORMAL LOW (ref 30.0–36.0)
MCV: 81.2 fL (ref 80.0–100.0)
Monocytes Absolute: 0.8 10*3/uL (ref 0.1–1.0)
Monocytes Relative: 8 %
Neutro Abs: 8.3 10*3/uL — ABNORMAL HIGH (ref 1.7–7.7)
Neutrophils Relative %: 83 %
Platelet Count: 270 10*3/uL (ref 150–400)
RBC: 4.15 MIL/uL — ABNORMAL LOW (ref 4.22–5.81)
RDW: 23.2 % — ABNORMAL HIGH (ref 11.5–15.5)
WBC Count: 10.1 10*3/uL (ref 4.0–10.5)
nRBC: 0 % (ref 0.0–0.2)

## 2020-01-31 MED ORDER — SODIUM CHLORIDE 0.9 % IV SOLN
20.0000 mg/m2 | Freq: Once | INTRAVENOUS | Status: AC
  Administered 2020-01-31: 36 mg via INTRAVENOUS
  Filled 2020-01-31: qty 3.6

## 2020-01-31 MED ORDER — SODIUM CHLORIDE 0.9 % IV SOLN
Freq: Once | INTRAVENOUS | Status: AC
  Filled 2020-01-31: qty 250

## 2020-01-31 MED ORDER — FAMOTIDINE IN NACL 20-0.9 MG/50ML-% IV SOLN
20.0000 mg | Freq: Once | INTRAVENOUS | Status: AC
  Administered 2020-01-31: 20 mg via INTRAVENOUS

## 2020-01-31 MED ORDER — FAMOTIDINE IN NACL 20-0.9 MG/50ML-% IV SOLN
INTRAVENOUS | Status: AC
  Filled 2020-01-31: qty 50

## 2020-01-31 MED ORDER — ENSURE ACTIVE HIGH PROTEIN PO LIQD: ORAL | 3 refills | Status: AC

## 2020-01-31 MED ORDER — DIPHENHYDRAMINE HCL 50 MG/ML IJ SOLN
INTRAMUSCULAR | Status: AC
  Filled 2020-01-31: qty 1

## 2020-01-31 MED ORDER — DIPHENHYDRAMINE HCL 50 MG/ML IJ SOLN
25.0000 mg | Freq: Once | INTRAMUSCULAR | Status: AC
  Administered 2020-01-31: 25 mg via INTRAVENOUS

## 2020-01-31 MED ORDER — SODIUM CHLORIDE 0.9% FLUSH
10.0000 mL | INTRAVENOUS | Status: DC | PRN
  Administered 2020-01-31: 10 mL
  Filled 2020-01-31: qty 10

## 2020-01-31 MED ORDER — SODIUM CHLORIDE 0.9 % IV SOLN
10.0000 mg | Freq: Once | INTRAVENOUS | Status: AC
  Administered 2020-01-31: 10 mg via INTRAVENOUS
  Filled 2020-01-31: qty 10

## 2020-01-31 MED ORDER — HEPARIN SOD (PORK) LOCK FLUSH 100 UNIT/ML IV SOLN
500.0000 [IU] | Freq: Once | INTRAVENOUS | Status: AC | PRN
  Administered 2020-01-31: 500 [IU]
  Filled 2020-01-31: qty 5

## 2020-01-31 MED ORDER — DENOSUMAB 120 MG/1.7ML ~~LOC~~ SOLN
120.0000 mg | Freq: Once | SUBCUTANEOUS | Status: AC
  Administered 2020-01-31: 120 mg via SUBCUTANEOUS

## 2020-01-31 MED ORDER — DENOSUMAB 120 MG/1.7ML ~~LOC~~ SOLN
SUBCUTANEOUS | Status: AC
  Filled 2020-01-31: qty 1.7

## 2020-01-31 NOTE — Progress Notes (Signed)
Hematology and Oncology Follow Up Visit  JORI THRALL [id number] [birth year] 74 y.o. 01/31/2020 8:18 AM    Principle Diagnosis: 74 year old man with castration-resistant prostate cancer with disease to the bone diagnosed in 2013.  Initially presented in 2001 with  Gleason score of 4+5 = 9 and advanced disease.   Prior Therapy: 1. He was started on Lupron after attempted prostatectomy in 2001.    2. 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  3. He is status post radiation therapy to the prostate fossa completed in December 2018. He received 52.5 gr 20 fractions.  4. Zytiga 1000 mg po daily with prednisone 5 mg started in 09/2011.   5. Taxotere chemotherapy 75 mg per metered square with cycle 1 started on 01/19/2019.  His dose was reduced to 60 mg per metered square starting with cycle 8 of therapy.  He is status post 11 cycles of therapy.  Current therapy:   Xgeva 120 mg started on 10/31/2011.  This will be given every 6 weeks.   Eligard every 4 months.  Next injection will be in September 2021.  Jevtana chemotherapy 20 mg per metered square with cycle 1 given on Nov 08, 2019.  He is here for cycle 5 of therapy.    Interim History: Mr. Tsosie is here for return evaluation.  Since the last visit, he reports no major changes in his health.  He continues to have gradual improvement in his overall health and performance status.  He still requiring oxygen although his respiratory status is stable.  His cough is improved and does not report any increased wheezing or dyspnea.  He does ambulate without any major difficulties able to drive.  Although he is eating better still has not gained more weight.  He does use Ensure 2 times a day.         Medications: Reviewed without changes. Current Outpatient Medications  Medication Sig Dispense Refill  . albuterol (VENTOLIN HFA) 108 (90 Base) MCG/ACT inhaler Inhale 1-2 puffs into  the lungs every 6 (six) hours as needed for wheezing or shortness of breath. 18 g 2  . atorvastatin (LIPITOR) 20 MG tablet Take 1 tablet (20 mg total) by mouth daily. 90 tablet 3  . denosumab (XGEVA) 120 MG/1.7ML SOLN Inject 120 mg into the skin every 30 (thirty) days.    . diphenoxylate-atropine (LOMOTIL) 2.5-0.025 MG tablet Take 1 tablet by mouth 4 (four) times daily as needed for diarrhea or loose stools. 30 tablet 0  . EPIPEN 2-PAK 0.3 MG/0.3ML SOAJ injection Inject 0.3 mg into the muscle as needed for anaphylaxis.     [name] gabapentin (NEURONTIN) 300 MG capsule Take 1 capsule (300 mg total) by mouth at bedtime. 90 capsule 3  . hydrochlorothiazide (HYDRODIURIL) 25 MG tablet Take 1 tablet (25 mg total) by mouth daily. 90 tablet 3  . HYDROcodone-homatropine (HYCODAN) 5-1.5 MG/5ML syrup Take 5 mLs by mouth every 6 (six) hours as needed for cough. 120 mL 0  . leuprolide (LUPRON) 30 MG injection Inject 30 mg into the muscle every 4 (four) months.    . Opium 10 MG/ML (1%) TINC Take 0.6 mLs (6 mg total) by mouth every 4 (four) hours as needed for diarrhea or loose stools. (Patient not taking: Reported on 12/07/2019) 118 mL 0  . prochlorperazine (COMPAZINE) 10 MG tablet TAKE 1 TABLET BY MOUTH EVERY 6 HOURS AS NEEDED FOR NAUSEA FOR VOMITING (Patient taking differently: Take 10 mg  by mouth every 6 (six) hours as needed for nausea or vomiting. ) 30 tablet 3  . rOPINIRole (REQUIP) 2 MG tablet Take 1 tablet (2 mg total) by mouth at bedtime. 90 tablet 2   No current facility-administered medications for this visit.   Facility-Administered Medications Ordered in Other Visits  Medication Dose Route Frequency Provider Last Rate Last Admin  . sodium chloride flush (NS) 0.9 % injection 10 mL  10 mL Intracatheter PRN [name], MD   10 mL at 09/07/19 0954  . sodium chloride flush (NS) 0.9 % injection 10 mL  10 mL Intracatheter PRN [name], MD   10 mL at 01/31/20 1761     Allergies:  Allergies   Allergen Reactions  . Bee Venom Anaphylaxis, Shortness Of Breath and Swelling    Tongue swelling.   . Wasp Venom Anaphylaxis, Shortness Of Breath and Swelling    Tongue swelling   . Percocet [Oxycodone-Acetaminophen] Palpitations       Physical Exam:    Blood pressure 130/74, pulse 81, temperature 98 F (36.7 C), temperature source Oral, resp. rate 18, weight 141 lb 6.4 oz (64.1 kg), SpO2 100 %.      ECOG: 2     General appearance: Comfortable appearing without any discomfort Head: Normocephalic without any trauma Oropharynx: Mucous membranes are moist and pink without any thrush or ulcers. Eyes: Pupils are equal and round reactive to light. Lymph nodes: No cervical, supraclavicular, inguinal or axillary lymphadenopathy.   Heart:regular rate and rhythm.  S1 and S2 without leg edema. Lung:  Expiratory wheezes noted bilaterally. Abdomin: Soft, nontender, nondistended with good bowel sounds.  No hepatosplenomegaly. Musculoskeletal: No joint deformity or effusion.  Full range of motion noted. Neurological: No deficits noted on motor, sensory and deep tendon reflex exam. Skin: No petechial rash or dryness.  Appeared moist.                        Lab Results: Lab Results  Component Value Date   WBC 10.1 01/31/2020   HGB 9.9 (L) 01/31/2020   HCT 33.7 (L) 01/31/2020   MCV 81.2 01/31/2020   PLT 270 01/31/2020     Chemistry      Component Value Date/Time   NA 144 01/10/2020 0807   NA 141 05/20/2017 1425   K 3.9 01/10/2020 0807   K 4.3 05/20/2017 1425   CL 107 01/10/2020 0807   CL 105 12/08/2012 1504   CO2 30 01/10/2020 0807   CO2 31 (H) 05/20/2017 1425   BUN 14 01/10/2020 0807   BUN 17.8 05/20/2017 1425   CREATININE 0.73 01/10/2020 0807   CREATININE 0.9 05/20/2017 1425      Component Value Date/Time   CALCIUM 8.8 (L) 01/10/2020 0807   CALCIUM 9.4 05/20/2017 1425   ALKPHOS 75 01/10/2020 0807   ALKPHOS 72 05/20/2017 1425   AST 19  01/10/2020 0807   AST 15 05/20/2017 1425   ALT 6 01/10/2020 0807   ALT <6 05/20/2017 1425   BILITOT 0.2 (L) 01/10/2020 0807   BILITOT 0.25 05/20/2017 1425         Results for EMARION, TORAL (MRN [id number]) as of 01/31/2020 08:19  Ref. Range 12/20/2019 08:20 01/10/2020 08:07  Prostate Specific Ag, Serum Latest Ref Range: 0.0 - 4.0 ng/mL 228.0 (H) 207.0 (H)        Impression and Plan:   74 year old man with:    1.  Castration-resistant prostate cancer with disease  to the bone documented in 2013.    He is currently on Jevtana chemotherapy without any major complications.  His PSA is declining slightly associated with some clinical benefit.  Risks and benefits of continuing chemotherapy were reviewed today.  Potential complications include nausea, vomiting, myelosuppression, neuropathy and edema were reiterated.  He is agreeable to proceed at this time.  He has benefited from this chemotherapy and the plan is to continue for the time being.  2. Bone directed therapy: He is currently on Xgeva with last treatment given in June 2021.  This will be repeated today and in 6 weeks.  Complications such as osteonecrosis of the jaw and hypocalcemia were reviewed.   3. Androgen deprivation: He is currently on Eligard which will be repeated in September 2021.   5.  IV access: Port-A-Cath remains in use without any issues.  6.  Prognosis and goals of care: His disease remains incurable only palliated at this time.  Performance status is reasonable and aggressive measures are warranted.  7.  Flank pain and back pain: He is no longer requiring any pain medication.   8.  Weight loss: He has not lost more weight during the last 3 weeks.  We have discussed strategies to improve it including using warm Ensure.   9.  Diarrhea: Manageable with antidiarrheal medication.  10.  Pulmonary considerations: No major respiratory status at this time with overall improvement since the start of chemotherapy  which could indicate possible response.  He is still requiring oxygen as well as bronchodilators which improved his symptoms.   11.  Followup: He will return in 3 weeks for the next cycle of treatment.   30  minutes were spent on this encounter.  The time was dedicated to reviewing his laboratory data, disease status update and outlining future plan of care.   Zola Button, MD 8/10/20218:18 AM

## 2020-01-31 NOTE — Patient Instructions (Signed)
[facility] Discharge Instructions for Patients Receiving Chemotherapy  Today you received the following chemotherapy agents: cabazitaxel.  To help prevent nausea and vomiting after your treatment, we encourage you to take your nausea medication as directed.   If you develop nausea and vomiting that is not controlled by your nausea medication, call the clinic.   BELOW ARE SYMPTOMS THAT SHOULD BE REPORTED IMMEDIATELY:  *FEVER GREATER THAN 100.5 F  *CHILLS WITH OR WITHOUT FEVER  NAUSEA AND VOMITING THAT IS NOT CONTROLLED WITH YOUR NAUSEA MEDICATION  *UNUSUAL SHORTNESS OF BREATH  *UNUSUAL BRUISING OR BLEEDING  TENDERNESS IN MOUTH AND THROAT WITH OR WITHOUT PRESENCE OF ULCERS  *URINARY PROBLEMS  *BOWEL PROBLEMS  UNUSUAL RASH Items with * indicate a potential emergency and should be followed up as soon as possible.  Feel free to call the clinic should you have any questions or concerns. The clinic phone number is [phone].  Please show the CHEMO ALERT CARD at check-in to the Emergency Department and triage nurse.   

## 2020-01-31 NOTE — Addendum Note (Signed)
Addended by: [name] on: 01/31/2020 08:41 AM   Modules accepted: Orders

## 2020-02-01 ENCOUNTER — Telehealth: Payer: Self-pay | Admitting: *Deleted

## 2020-02-01 LAB — PROSTATE-SPECIFIC AG, SERUM (LABCORP): Prostate Specific Ag, Serum: 203 ng/mL — ABNORMAL HIGH (ref 0.0–4.0)

## 2020-02-01 NOTE — Telephone Encounter (Signed)
-----   Message from [name], MD sent at 02/01/2020  8:47 AM EDT ----- Please let him know his PSA is down.

## 2020-02-01 NOTE — Telephone Encounter (Signed)
Notified of message below

## 2020-02-02 ENCOUNTER — Other Ambulatory Visit: Payer: Self-pay

## 2020-02-02 ENCOUNTER — Inpatient Hospital Stay: Payer: Medicare Other

## 2020-02-02 VITALS — BP 120/68 | HR 101 | Temp 99.1°F | Resp 18

## 2020-02-02 DIAGNOSIS — C61 Malignant neoplasm of prostate: Secondary | ICD-10-CM

## 2020-02-02 DIAGNOSIS — Z5111 Encounter for antineoplastic chemotherapy: Secondary | ICD-10-CM | POA: Diagnosis not present

## 2020-02-02 MED ORDER — PEGFILGRASTIM-JMDB 6 MG/0.6ML ~~LOC~~ SOSY
6.0000 mg | PREFILLED_SYRINGE | Freq: Once | SUBCUTANEOUS | Status: AC
  Administered 2020-02-02: 6 mg via SUBCUTANEOUS

## 2020-02-02 MED ORDER — PEGFILGRASTIM-JMDB 6 MG/0.6ML ~~LOC~~ SOSY
PREFILLED_SYRINGE | SUBCUTANEOUS | Status: AC
  Filled 2020-02-02: qty 0.6

## 2020-02-02 NOTE — Patient Instructions (Signed)

## 2020-02-07 ENCOUNTER — Telehealth: Payer: Self-pay

## 2020-02-07 NOTE — Telephone Encounter (Signed)
Received TC from patient in regard to wanting a different oxygen tank that was more portable when out and he was unsure who to call about that. Directed pt to call his PCP. Pt verbalized understanding.

## 2020-02-10 ENCOUNTER — Other Ambulatory Visit: Payer: Self-pay

## 2020-02-10 ENCOUNTER — Ambulatory Visit: Payer: Medicare Other | Admitting: Family Medicine

## 2020-02-10 ENCOUNTER — Encounter: Payer: Self-pay | Admitting: Family Medicine

## 2020-02-10 ENCOUNTER — Telehealth: Payer: Self-pay | Admitting: *Deleted

## 2020-02-10 VITALS — BP 122/80 | HR 102 | Wt 137.4 lb

## 2020-02-10 DIAGNOSIS — C61 Malignant neoplasm of prostate: Secondary | ICD-10-CM | POA: Diagnosis not present

## 2020-02-10 DIAGNOSIS — I10 Essential (primary) hypertension: Secondary | ICD-10-CM

## 2020-02-10 DIAGNOSIS — R630 Anorexia: Secondary | ICD-10-CM

## 2020-02-10 DIAGNOSIS — Z0000 Encounter for general adult medical examination without abnormal findings: Secondary | ICD-10-CM

## 2020-02-10 DIAGNOSIS — C7800 Secondary malignant neoplasm of unspecified lung: Secondary | ICD-10-CM

## 2020-02-10 MED ORDER — MIRTAZAPINE 15 MG PO TABS: 15.0000 mg | ORAL_TABLET | Freq: Every day | ORAL | 2 refills | Status: DC

## 2020-02-10 NOTE — Progress Notes (Signed)
    SUBJECTIVE:   CHIEF COMPLAINT / HPI:   [name] Mathews is a 74 yo M who presents for the below issues.   Mobile oxygen need  Hx of Metastatic prostate cancer with lung invovlement Currently carrying oxygen tank. Pulmonogist appt. 1 month ago and asked to come to PCP office for mobile oxygen order. Recent paracentesis and got 1 L off. Currently requiring 2L.   Loss of appetite Endorsing continued loss of appetite/weight loss despite attempting bland foods and supplementation such as ensure. Would like a medication to help with this.   PERTINENT  PMH / PSH: HTN (HCTZ 25 mg daily), Recurrent prostate adenocarcinoma (Stage IV; 2015)  OBJECTIVE:   BP 122/80   Pulse (!) 102   Wt 137 lb 6.4 oz (62.3 kg)   SpO2 98%   BMI 22.86 kg/m   General: Appears well, no acute distress. Age appropriate. Cardiac: RRR, normal heart sounds, no murmurs Respiratory: CTAB, normal effort  ULTRASOUND GUIDED RIGHT THORACENTESIS IMPRESSION: Successful ultrasound guided right thoracentesis yielding 900 mL of pleural fluid. Read by [name] Norse, PA-C No pneumothorax on follow-up radiograph.  Electronically Signed   By: [name] Mathews M.D.   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 Continue HCTZ.    [name] Mathews, [place]

## 2020-02-10 NOTE — Patient Instructions (Signed)
It was very nice to meet you today. Please enjoy the rest of your week. Today you were seen for portable oxygen. The order has been placed and you should receive this equipment shortly.  Follow up for annual physical exam and bring ALL of your mediations.   Please call the clinic at [phone] if you have any concerns. It was our pleasure to serve you.

## 2020-02-10 NOTE — Telephone Encounter (Signed)
Community message sent to [name] Mathews, [name] Mathews and [name] Mathews @ [facility] to process DME order for Portable O2.   Christen Bame, CMA

## 2020-02-10 NOTE — Telephone Encounter (Signed)
Received and will process now.  [name] Mathews, CMA

## 2020-02-12 DIAGNOSIS — C61 Malignant neoplasm of prostate: Secondary | ICD-10-CM | POA: Insufficient documentation

## 2020-02-12 DIAGNOSIS — R630 Anorexia: Secondary | ICD-10-CM | POA: Insufficient documentation

## 2020-02-12 NOTE — Assessment & Plan Note (Signed)
98% on 2L [place] at rest 92 % on RA at rest 97 % ambulating with 2L [place] Patient would benefit from [place] d/t metastasis to the lung and recently requiring paracentesis with fluid removing to improve breathing -DME for portable oxygen placed during this encounter

## 2020-02-12 NOTE — Assessment & Plan Note (Signed)
-  Encouraged to schedule annual physical exam and bring ALL medications to this visit; we do not have full medication list

## 2020-02-12 NOTE — Assessment & Plan Note (Signed)
-  Continue bland foods if desired -Continue supplementation -Start mirtazipine 7.5 mg daily

## 2020-02-12 NOTE — Assessment & Plan Note (Signed)
BP at goal. Continue HCTZ.

## 2020-02-21 ENCOUNTER — Inpatient Hospital Stay: Payer: Medicare Other | Admitting: Oncology

## 2020-02-21 ENCOUNTER — Inpatient Hospital Stay: Payer: Medicare Other

## 2020-02-21 ENCOUNTER — Other Ambulatory Visit: Payer: Self-pay

## 2020-02-21 VITALS — BP 157/91 | HR 93 | Temp 97.9°F | Resp 20 | Ht 65.0 in | Wt 139.4 lb

## 2020-02-21 DIAGNOSIS — C61 Malignant neoplasm of prostate: Secondary | ICD-10-CM | POA: Diagnosis not present

## 2020-02-21 DIAGNOSIS — Z95828 Presence of other vascular implants and grafts: Secondary | ICD-10-CM

## 2020-02-21 DIAGNOSIS — Z5111 Encounter for antineoplastic chemotherapy: Secondary | ICD-10-CM | POA: Diagnosis not present

## 2020-02-21 LAB — CBC WITH DIFFERENTIAL (CANCER CENTER ONLY)
Abs Immature Granulocytes: 0.04 10*3/uL (ref 0.00–0.07)
Basophils Absolute: 0 10*3/uL (ref 0.0–0.1)
Basophils Relative: 0 %
Eosinophils Absolute: 0 10*3/uL (ref 0.0–0.5)
Eosinophils Relative: 0 %
HCT: 33.5 % — ABNORMAL LOW (ref 39.0–52.0)
Hemoglobin: 9.9 g/dL — ABNORMAL LOW (ref 13.0–17.0)
Immature Granulocytes: 0 %
Lymphocytes Relative: 7 %
Lymphs Abs: 0.8 10*3/uL (ref 0.7–4.0)
MCH: 24.6 pg — ABNORMAL LOW (ref 26.0–34.0)
MCHC: 29.6 g/dL — ABNORMAL LOW (ref 30.0–36.0)
MCV: 83.1 fL (ref 80.0–100.0)
Monocytes Absolute: 0.8 10*3/uL (ref 0.1–1.0)
Monocytes Relative: 7 %
Neutro Abs: 9 10*3/uL — ABNORMAL HIGH (ref 1.7–7.7)
Neutrophils Relative %: 86 %
Platelet Count: 290 10*3/uL (ref 150–400)
RBC: 4.03 MIL/uL — ABNORMAL LOW (ref 4.22–5.81)
RDW: 21.5 % — ABNORMAL HIGH (ref 11.5–15.5)
WBC Count: 10.6 10*3/uL — ABNORMAL HIGH (ref 4.0–10.5)
nRBC: 0 % (ref 0.0–0.2)

## 2020-02-21 LAB — CMP (CANCER CENTER ONLY)
ALT: 14 U/L (ref 0–44)
AST: 30 U/L (ref 15–41)
Albumin: 2.7 g/dL — ABNORMAL LOW (ref 3.5–5.0)
Alkaline Phosphatase: 92 U/L (ref 38–126)
Anion gap: 2 — ABNORMAL LOW (ref 5–15)
BUN: 17 mg/dL (ref 8–23)
CO2: 33 mmol/L — ABNORMAL HIGH (ref 22–32)
Calcium: 9.8 mg/dL (ref 8.9–10.3)
Chloride: 105 mmol/L (ref 98–111)
Creatinine: 0.66 mg/dL (ref 0.61–1.24)
GFR, Est AFR Am: >60 mL/min (ref >60)
GFR, Estimated: >60 mL/min (ref >60)
Glucose, Bld: 96 mg/dL (ref 70–99)
Potassium: 4.2 mmol/L (ref 3.5–5.1)
Sodium: 140 mmol/L (ref 135–145)
Total Bilirubin: 0.2 mg/dL — ABNORMAL LOW (ref 0.3–1.2)
Total Protein: 6.6 g/dL (ref 6.5–8.1)

## 2020-02-21 MED ORDER — DIPHENHYDRAMINE HCL 50 MG/ML IJ SOLN
25.0000 mg | Freq: Once | INTRAMUSCULAR | Status: AC
  Administered 2020-02-21: 25 mg via INTRAVENOUS

## 2020-02-21 MED ORDER — SODIUM CHLORIDE 0.9 % IV SOLN
20.0000 mg/m2 | Freq: Once | INTRAVENOUS | Status: AC
  Administered 2020-02-21: 36 mg via INTRAVENOUS
  Filled 2020-02-21: qty 3.6

## 2020-02-21 MED ORDER — FAMOTIDINE IN NACL 20-0.9 MG/50ML-% IV SOLN
20.0000 mg | Freq: Once | INTRAVENOUS | Status: AC
  Administered 2020-02-21: 20 mg via INTRAVENOUS

## 2020-02-21 MED ORDER — SODIUM CHLORIDE 0.9 % IV SOLN
10.0000 mg | Freq: Once | INTRAVENOUS | Status: AC
  Administered 2020-02-21: 10 mg via INTRAVENOUS
  Filled 2020-02-21: qty 10

## 2020-02-21 MED ORDER — DIPHENHYDRAMINE HCL 50 MG/ML IJ SOLN
INTRAMUSCULAR | Status: AC
  Filled 2020-02-21: qty 1

## 2020-02-21 MED ORDER — SODIUM CHLORIDE 0.9 % IV SOLN
Freq: Once | INTRAVENOUS | Status: AC
  Filled 2020-02-21: qty 250

## 2020-02-21 MED ORDER — FAMOTIDINE IN NACL 20-0.9 MG/50ML-% IV SOLN
INTRAVENOUS | Status: AC
  Filled 2020-02-21: qty 50

## 2020-02-21 MED ORDER — SODIUM CHLORIDE 0.9% FLUSH
10.0000 mL | INTRAVENOUS | Status: DC | PRN
  Administered 2020-02-21: 10 mL
  Filled 2020-02-21: qty 10

## 2020-02-21 MED ORDER — HEPARIN SOD (PORK) LOCK FLUSH 100 UNIT/ML IV SOLN
500.0000 [IU] | Freq: Once | INTRAVENOUS | Status: AC | PRN
  Administered 2020-02-21: 500 [IU]
  Filled 2020-02-21: qty 5

## 2020-02-21 NOTE — Patient Instructions (Signed)
[facility] Discharge Instructions for Patients Receiving Chemotherapy  Today you received the following chemotherapy agents: cabazitaxel.  To help prevent nausea and vomiting after your treatment, we encourage you to take your nausea medication as directed.   If you develop nausea and vomiting that is not controlled by your nausea medication, call the clinic.   BELOW ARE SYMPTOMS THAT SHOULD BE REPORTED IMMEDIATELY:  *FEVER GREATER THAN 100.5 F  *CHILLS WITH OR WITHOUT FEVER  NAUSEA AND VOMITING THAT IS NOT CONTROLLED WITH YOUR NAUSEA MEDICATION  *UNUSUAL SHORTNESS OF BREATH  *UNUSUAL BRUISING OR BLEEDING  TENDERNESS IN MOUTH AND THROAT WITH OR WITHOUT PRESENCE OF ULCERS  *URINARY PROBLEMS  *BOWEL PROBLEMS  UNUSUAL RASH Items with * indicate a potential emergency and should be followed up as soon as possible.  Feel free to call the clinic should you have any questions or concerns. The clinic phone number is [phone].  Please show the CHEMO ALERT CARD at check-in to the Emergency Department and triage nurse.   

## 2020-02-21 NOTE — Progress Notes (Signed)
Hematology and Oncology Follow Up Visit  [name] Mathews [id number] 1945-08-06 74 y.o. 02/21/2020 9:18 AM    Principle Diagnosis: 74 year old man with prostate cancer diagnosed in 2001 who presented with a Gleason score of 4+5 = 9.  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  Guardant 360 analysis on September 07, 2019 showed no actionable mutation.   Prior Therapy: 1. He was started on Lupron after attempted prostatectomy in 2001.    2. 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  3. He is status post radiation therapy to the prostate fossa completed in December 2018. He received 52.5 gr 20 fractions.  4. Zytiga 1000 mg po daily with prednisone 5 mg started in 09/2011.   5. Taxotere chemotherapy 75 mg per metered square with cycle 1 started on 01/19/2019.  His dose was reduced to 60 mg per metered square starting with cycle 8 of therapy.  He is status post 11 cycles of therapy.  Current therapy:   Xgeva 120 mg started on 10/31/2011.  This will be given every 6 weeks.   Eligard every 4 months.  Next injection will be in September 2021.  Jevtana chemotherapy 20 mg per metered square with cycle 1 given on Nov 08, 2019.  He is here for cycle 6 of therapy.    Interim History: [name] Mathews presents today for a repeat evaluation.  Since the last visit, he reports no major changes in his health. He continues to live independently and attends to activities of daily living. Tolerated last cycle of chemotherapy without any major complaints. He does report some mild nausea and diarrhea that has been manageable with antiemetics and diarrhea medication. He does report some mild fatigue and occasional exertional dyspnea. She continues to be oxygen dependent which helps with his overall exercise tolerance. Still able to drive and walk short distances.         Medications: Updated on review. Current  Outpatient Medications  Medication Sig Dispense Refill  . albuterol (VENTOLIN HFA) 108 (90 Base) MCG/ACT inhaler Inhale 1-2 puffs into the lungs every 6 (six) hours as needed for wheezing or shortness of breath. 18 g 2  . denosumab (XGEVA) 120 MG/1.7ML SOLN Inject 120 mg into the skin every 30 (thirty) days.    . diphenoxylate-atropine (LOMOTIL) 2.5-0.025 MG tablet Take 1 tablet by mouth 4 (four) times daily as needed for diarrhea or loose stools. 30 tablet 0  . gabapentin (NEURONTIN) 300 MG capsule Take 1 capsule (300 mg total) by mouth at bedtime. 90 capsule 3  . hydrochlorothiazide (HYDRODIURIL) 25 MG tablet Take 1 tablet (25 mg total) by mouth daily. 90 tablet 3  . leuprolide (LUPRON) 30 MG injection Inject 30 mg into the muscle every 4 (four) months.    . mirtazapine (REMERON) 15 MG tablet Take 1 tablet (15 mg total) by mouth at bedtime. 30 tablet 2  . Nutritional Supplements (ENSURE ACTIVE HIGH PROTEIN) LIQD Use 3 cans daily. 2844 mL 3  . Opium 10 MG/ML (1%) TINC Take 0.6 mLs (6 mg total) by mouth every 4 (four) hours as needed for diarrhea or loose stools. (Patient not taking: Reported on 12/07/2019) 118 mL 0  . prochlorperazine (COMPAZINE) 10 MG tablet TAKE 1 TABLET BY MOUTH EVERY 6 HOURS AS NEEDED FOR NAUSEA FOR VOMITING (Patient taking differently: Take 10 mg by mouth every 6 (six) hours as needed for nausea or vomiting. ) 30 tablet  3   No current facility-administered medications for this visit.   Facility-Administered Medications Ordered in Other Visits  Medication Dose Route Frequency Provider Last Rate Last Admin  . sodium chloride flush (NS) 0.9 % injection 10 mL  10 mL Intracatheter PRN [name], MD   10 mL at 09/07/19 0954  . sodium chloride flush (NS) 0.9 % injection 10 mL  10 mL Intracatheter PRN [name], MD   10 mL at 02/21/20 0912     Allergies:  Allergies  Allergen Reactions  . Bee Venom Anaphylaxis, Shortness Of Breath and Swelling    Tongue swelling.    . Wasp Venom Anaphylaxis, Shortness Of Breath and Swelling    Tongue swelling   . Percocet [Oxycodone-Acetaminophen] Palpitations       Physical Exam:     Blood pressure (!) 157/91, pulse 93, temperature 97.9 F (36.6 C), temperature source Tympanic, resp. rate 20, height 5\' 5"  (1.651 m), weight 139 lb 6.4 oz (63.2 kg), SpO2 100 %.      ECOG: 2      General appearance: Alert, awake without any distress. Head: Atraumatic without abnormalities Oropharynx: Without any thrush or ulcers. Eyes: No scleral icterus. Lymph nodes: No lymphadenopathy noted in the cervical, supraclavicular, or axillary nodes Heart:regular rate and rhythm, without any murmurs or gallops.   Lung:  Scattered rhonchi and wheezes bilaterally. Abdomin: Soft, nontender without any shifting dullness or ascites. Musculoskeletal: No clubbing or cyanosis. Neurological: No motor or sensory deficits. Skin: No rashes or lesions.       Lab Results: Lab Results  Component Value Date   WBC 10.1 01/31/2020   HGB 9.9 (L) 01/31/2020   HCT 33.7 (L) 01/31/2020   MCV 81.2 01/31/2020   PLT 270 01/31/2020     Chemistry      Component Value Date/Time   NA 143 01/31/2020 0808   NA 141 05/20/2017 1425   K 4.3 01/31/2020 0808   K 4.3 05/20/2017 1425   CL 104 01/31/2020 0808   CL 105 12/08/2012 1504   CO2 31 01/31/2020 0808   CO2 31 (H) 05/20/2017 1425   BUN 14 01/31/2020 0808   BUN 17.8 05/20/2017 1425   CREATININE 0.71 01/31/2020 0808   CREATININE 0.9 05/20/2017 1425      Component Value Date/Time   CALCIUM 9.2 01/31/2020 0808   CALCIUM 9.4 05/20/2017 1425   ALKPHOS 70 01/31/2020 0808   ALKPHOS 72 05/20/2017 1425   AST 23 01/31/2020 0808   AST 15 05/20/2017 1425   ALT <6 01/31/2020 0808   ALT <6 05/20/2017 1425   BILITOT 0.2 (L) 01/31/2020 0808   BILITOT 0.25 05/20/2017 1425          Results for [name] Mathews, [name] Mathews (MRN [id number]) as of 02/21/2020 09:19  Ref. Range 01/10/2020 08:07 01/31/2020  08:08  Prostate Specific Ag, Serum Latest Ref Range: 0.0 - 4.0 ng/mL 207.0 (H) 203.0 (H)        Impression and Plan:   74 year old man with:    1.  Advanced prostate cancer with disease to the bone diagnosed in 2013.  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  The natural course of his disease was updated today and treatment options were discussed.  He is currently on salvage systemic chemotherapy without any major complications.  His PSA is showing a slight decline in overall stabilization of his health status.  Risks and benefits of continuing this treatment and alternative treatment options were reviewed.  After discussion  today the plan is to continue with chemotherapy given his overall clinical response and PSA decline. He has experienced overall improvement in his quality of life since the start of chemotherapy and willing to continue. He will receive today's cycle and will be repeated in 3 weeks and in 6 weeks.  2. Bone directed therapy: He received Xgeva on August 10 and will be repeated with the next visit on March 13, 2020.  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  Preventing skeletal related events including bone fractures and cord compression is essential.   3. Androgen deprivation: Last injection was given on Oct 31, 2019.  He will receive his next Eligard on March 13, 2020.  Complications including weight gain, hot flashes among others were reviewed.   5.  IV access: Port-A-Cath currently in use without any issues.  6.  Prognosis and goals of care: Therapy remains palliative although aggressive measures are warranted given his reasonable performance status.  7. Covid vaccinations consideration: He would be a candidate for a booster given his malignancy and ongoing chemotherapy. We will schedule that in the next week.   8.  Weight loss: His appetite is better and has gained more weight.   9.  Diarrhea:  Related to chemotherapy and appears to be under control at this time.  10.  Pulmonary considerations: Etiology is multifactorial related to malignancy and possible chemotherapy.  Pulmonary status remains stable at this time. Still requiring oxygen and his lung exam continues to be abnormal. Upon completing chemotherapy will update his staging imaging.  11.  Growth factor support: He will require Fulphila after each cycle of chemotherapy.  He has high risk of developing neutropenia and sepsis given his treatment history.   12.  Followup: He will return in 3 weeks for the next cycle of treatment. His next appointment after that will be on April 04, 2020.   30  minutes were dedicated to this visit.  The time was spent on reviewing his disease status, discussing treatment options and addressing complication related to therapy and future plan of care.   Zola Button, MD 8/31/20219:18 AM

## 2020-02-22 ENCOUNTER — Ambulatory Visit (INDEPENDENT_AMBULATORY_CARE_PROVIDER_SITE_OTHER): Payer: Medicare Other | Admitting: Family Medicine

## 2020-02-22 ENCOUNTER — Encounter: Payer: Self-pay | Admitting: Family Medicine

## 2020-02-22 VITALS — BP 144/80 | HR 107 | Ht 65.0 in | Wt 140.8 lb

## 2020-02-22 DIAGNOSIS — Z23 Encounter for immunization: Secondary | ICD-10-CM

## 2020-02-22 DIAGNOSIS — I10 Essential (primary) hypertension: Secondary | ICD-10-CM

## 2020-02-22 DIAGNOSIS — Z0000 Encounter for general adult medical examination without abnormal findings: Secondary | ICD-10-CM

## 2020-02-22 LAB — PROSTATE-SPECIFIC AG, SERUM (LABCORP): Prostate Specific Ag, Serum: 244 ng/mL — ABNORMAL HIGH (ref 0.0–4.0)

## 2020-02-22 NOTE — Patient Instructions (Signed)
It was great meeting you today at the clinic.  Today we performed your physical, and you received your flu vaccine.  I also gave you the number to call to get your colonoscopy done.  Feel free to let me know if you have any questions or concerns.  Thank you for allowing me to take part in your care!  Shary Key, D.O. [facility], PGY-1

## 2020-02-22 NOTE — Progress Notes (Signed)
    SUBJECTIVE:   CHIEF COMPLAINT / HPI:   [name] Mathews is a 73 yo here for his annual physical and to meet me, his new PCP. He endorsed feeling short of breath after walking from his car to the clinic, and states that it is typical for him. Denies any mood complaints as he states he is doing well mentally with his cancer and has known for 20 years. He endorses being grateful to be here. Patient denies any concerns or complaints today. We took care of health maintenance items today including getting him set up for his colonoscopy and getting his flu vaccine. He already has completed his COVID vaccines and will bring his card with him next time.   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     Pulses: Normal pulses.     Heart sounds: Normal heart sounds. No murmur heard.   Pulmonary:     Effort: Pulmonary effort is normal.     Breath sounds: Normal breath sounds.  Abdominal:     General: There is no distension.     Palpations: Abdomen is soft.     Tenderness: There is no abdominal tenderness.  Skin:    General: Skin is warm and dry.     ASSESSMENT/PLAN:   No problem-specific Assessment & Plan notes found for this encounter.   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 O2 sat when after he walked from his car to his exam room was 83%. On repeat pulse ox he was satting 97% on his 2L - continue to follow with oncologist  Essential hypertension BP currently 144/80 which is slightly elevated most likely due to being measured after his walk. His previous measurements have been at goal. - continue HCTZ 25mg  daily   Routine Health Maintenance  - Reviewed patient's current medications  - Gave patient the  information to schedule his colonoscopy - received flu vaccine in clinic   Shary Key, [place]

## 2020-02-23 ENCOUNTER — Inpatient Hospital Stay: Payer: Medicare Other | Attending: Oncology

## 2020-02-23 ENCOUNTER — Other Ambulatory Visit: Payer: Self-pay

## 2020-02-23 VITALS — BP 128/70 | HR 96 | Resp 18

## 2020-02-23 DIAGNOSIS — J910 Malignant pleural effusion: Secondary | ICD-10-CM | POA: Insufficient documentation

## 2020-02-23 DIAGNOSIS — C7951 Secondary malignant neoplasm of bone: Secondary | ICD-10-CM | POA: Diagnosis present

## 2020-02-23 DIAGNOSIS — C61 Malignant neoplasm of prostate: Secondary | ICD-10-CM | POA: Diagnosis present

## 2020-02-23 DIAGNOSIS — J9690 Respiratory failure, unspecified, unspecified whether with hypoxia or hypercapnia: Secondary | ICD-10-CM | POA: Insufficient documentation

## 2020-02-23 DIAGNOSIS — Z9981 Dependence on supplemental oxygen: Secondary | ICD-10-CM | POA: Diagnosis not present

## 2020-02-23 DIAGNOSIS — Z5189 Encounter for other specified aftercare: Secondary | ICD-10-CM | POA: Diagnosis not present

## 2020-02-23 MED ORDER — PEGFILGRASTIM-JMDB 6 MG/0.6ML ~~LOC~~ SOSY
PREFILLED_SYRINGE | SUBCUTANEOUS | Status: AC
  Filled 2020-02-23: qty 0.6

## 2020-02-23 MED ORDER — PEGFILGRASTIM-JMDB 6 MG/0.6ML ~~LOC~~ SOSY
6.0000 mg | PREFILLED_SYRINGE | Freq: Once | SUBCUTANEOUS | Status: AC
  Administered 2020-02-23: 6 mg via SUBCUTANEOUS

## 2020-02-23 NOTE — Patient Instructions (Signed)

## 2020-02-28 ENCOUNTER — Ambulatory Visit (INDEPENDENT_AMBULATORY_CARE_PROVIDER_SITE_OTHER): Payer: Medicare Other

## 2020-02-28 ENCOUNTER — Ambulatory Visit: Payer: Medicare Other | Admitting: Adult Health

## 2020-02-28 ENCOUNTER — Telehealth: Payer: Self-pay | Admitting: Pulmonary Disease

## 2020-02-28 ENCOUNTER — Ambulatory Visit: Payer: Medicare Other

## 2020-02-28 ENCOUNTER — Encounter: Payer: Self-pay | Admitting: Adult Health

## 2020-02-28 ENCOUNTER — Other Ambulatory Visit: Payer: Self-pay

## 2020-02-28 VITALS — BP 118/60 | HR 102 | Temp 97.3°F | Ht 65.0 in | Wt 138.4 lb

## 2020-02-28 DIAGNOSIS — R52 Pain, unspecified: Secondary | ICD-10-CM | POA: Diagnosis not present

## 2020-02-28 DIAGNOSIS — M79662 Pain in left lower leg: Secondary | ICD-10-CM | POA: Diagnosis not present

## 2020-02-28 DIAGNOSIS — R0602 Shortness of breath: Secondary | ICD-10-CM | POA: Diagnosis not present

## 2020-02-28 DIAGNOSIS — J90 Pleural effusion, not elsewhere classified: Secondary | ICD-10-CM

## 2020-02-28 DIAGNOSIS — J189 Pneumonia, unspecified organism: Secondary | ICD-10-CM | POA: Diagnosis not present

## 2020-02-28 DIAGNOSIS — J188 Other pneumonia, unspecified organism: Secondary | ICD-10-CM | POA: Insufficient documentation

## 2020-02-28 DIAGNOSIS — J9611 Chronic respiratory failure with hypoxia: Secondary | ICD-10-CM | POA: Insufficient documentation

## 2020-02-28 MED ORDER — AMOXICILLIN-POT CLAVULANATE 875-125 MG PO TABS: 1.0000 | ORAL_TABLET | Freq: Two times a day (BID) | ORAL | 0 refills | Status: AC

## 2020-02-28 NOTE — Patient Instructions (Signed)
Begin Augmentin 875mg  Twice daily  For 7 days ,take with food.  Mucinex DM Twice daily  As needed  Congestion.  Continue on Oxygen 2l/m .  Venous Doppler of left leg.  Follow up in 1 week with Dr. Hermina Staggers  and As needed   Please contact office for sooner follow up if symptoms do not improve or worsen or seek emergency care

## 2020-02-28 NOTE — Assessment & Plan Note (Signed)
Mild left calf pain x 3-4 months ? Etiology  Known Cancer  Check venous dopplers.

## 2020-02-28 NOTE — Assessment & Plan Note (Signed)
Right sided PNA - immunosuppressed patient . Appears stable with no increased oxygen demands Begin Augmentin  Close .follow up in  1 week   Plan  Patient Instructions  Begin Augmentin 875mg  Twice daily  For 7 days ,take with food.  Mucinex DM Twice daily  As needed  Congestion.  Continue on Oxygen 2l/m .  Venous Doppler of left leg.  Follow up in 1 week with Dr. Hermina Staggers  and As needed   Please contact office for sooner follow up if symptoms do not improve or worsen or seek emergency care

## 2020-02-28 NOTE — Progress Notes (Signed)
@Patient  ID: [name] Mathews, male    DOB: [birth year], 74 y.o.   MRN: [id number]  Chief Complaint  Patient presents with  . Follow-up    COPD    Referring provider: Shary Key, DO  HPI: 74 year old male former smoker followed for metastatic prostate cancer with lymphangitic spread (on active treatment) , bilateral pleural effusions, chronic respiratory failure on oxygen   TEST/EVENTS :  CT chest 11/11/2019 grossly stable metastatic disease with bilateral parenchymal lung consolidation, likely lymphangitic spread of disease at the lung bases and bilateral pleural effusions.  Diffuse skeletal metastasis  02/28/2020 Acute OV : Cough  Patient presents for an acute office visit , complains of 2 weeks of increased dyspnea with activity , slight increase dry cough . Minimally productive cough . Pain with coughing at times.  Appetite is fair.  No nausea vomiting. Chest x-ray in the office shows small right pleural effusion and increased atelectasis in the right mid and lower lungs possibly underlying early pneumonia.  Stable bilateral pulmonary nodularity consistent with known metastatic disease.  Covid vaccines utd. No fever.   Started on Oxygen 2l/m in June 2021.   Patient also complains of some left calf tenderness and pain intermittently has been going on for 3 to 4 months.  Mild swelling at times.     Allergies  Allergen Reactions  . Bee Venom Anaphylaxis, Shortness Of Breath and Swelling    Tongue swelling.   . Wasp Venom Anaphylaxis, Shortness Of Breath and Swelling    Tongue swelling   . Percocet [Oxycodone-Acetaminophen] Palpitations    Immunization History  Administered Date(s) Administered  . Fluad Quad(high Dose 65+) 03/24/2019  . Influenza,inj,Quad PF,6+ Mos 03/09/2014, 06/06/2016, 05/20/2017, 02/22/2020  . PFIZER SARS-COV-2 Vaccination 08/21/2019, 09/20/2019  . Td 11/21/2016    Past Medical History:  Diagnosis Date  . Hyperlipidemia   . Hypertension   .  Prostate cancer ([place])    metastasis bones  . Restless leg syndrome     Tobacco History: Social History   Tobacco Use  Smoking Status Former Smoker  . Packs/day: 0.25  . Years: 6.00  . Pack years: 1.50  . Start date: 75  . Quit date: 70  . Years since quitting: 46.7  Smokeless Tobacco Never Used   Counseling given: Not Answered   Outpatient Medications Prior to Visit  Medication Sig Dispense Refill  . albuterol (VENTOLIN HFA) 108 (90 Base) MCG/ACT inhaler Inhale 1-2 puffs into the lungs every 6 (six) hours as needed for wheezing or shortness of breath. 18 g 2  . denosumab (XGEVA) 120 MG/1.7ML SOLN Inject 120 mg into the skin every 30 (thirty) days.    . diphenoxylate-atropine (LOMOTIL) 2.5-0.025 MG tablet Take 1 tablet by mouth 4 (four) times daily as needed for diarrhea or loose stools. 30 tablet 0  . gabapentin (NEURONTIN) 300 MG capsule Take 1 capsule (300 mg total) by mouth at bedtime. 90 capsule 3  . hydrochlorothiazide (HYDRODIURIL) 25 MG tablet Take 1 tablet (25 mg total) by mouth daily. 90 tablet 3  . leuprolide (LUPRON) 30 MG injection Inject 30 mg into the muscle every 4 (four) months.    . mirtazapine (REMERON) 15 MG tablet Take 1 tablet (15 mg total) by mouth at bedtime. 30 tablet 2  . Nutritional Supplements (ENSURE ACTIVE HIGH PROTEIN) LIQD Use 3 cans daily. 2844 mL 3  . Opium 10 MG/ML (1%) TINC Take 0.6 mLs (6 mg total) by mouth every 4 (four) hours as needed for diarrhea  or loose stools. 118 mL 0  . prochlorperazine (COMPAZINE) 10 MG tablet TAKE 1 TABLET BY MOUTH EVERY 6 HOURS AS NEEDED FOR NAUSEA FOR VOMITING (Patient taking differently: Take 10 mg by mouth every 6 (six) hours as needed for nausea or vomiting. ) 30 tablet 3   Facility-Administered Medications Prior to Visit  Medication Dose Route Frequency Provider Last Rate Last Admin  . sodium chloride flush (NS) 0.9 % injection 10 mL  10 mL Intracatheter PRN [name], MD   10 mL at 09/07/19 0954      Review of Systems:   Constitutional:   No  weight loss, night sweats,  Fevers, chills,  +fatigue, or  lassitude.  HEENT:   No headaches,  Difficulty swallowing,  Tooth/dental problems, or  Sore throat,                No sneezing, itching, ear ache, nasal congestion, post nasal drip,   CV:  No chest pain,  Orthopnea, PND, swelling in lower extremities, anasarca, dizziness, palpitations, syncope.  Positive left calf pain  GI  No heartburn, indigestion, abdominal pain, nausea, vomiting, diarrhea, change in bowel habits, loss of appetite, bloody stools.   Resp:  .  No chest wall deformity  Skin: no rash or lesions.  GU: no dysuria, change in color of urine, no urgency or frequency.  No flank pain, no hematuria   MS:  No joint pain or swelling.  No decreased range of motion.  No back pain.    Physical Exam  BP 118/60 (BP Location: Left Arm, Cuff Size: Normal)   Pulse (!) 102   Temp (!) 97.3 F (36.3 C) (Temporal)   Ht 5\' 5"  (1.651 m)   Wt 138 lb 6.4 oz (62.8 kg)   SpO2 97% Comment: 2 liters continuous  BMI 23.03 kg/m   GEN: A/Ox3; pleasant , NAD, well nourished    HEENT:  [place]/AT,  EACs-clear, TMs-wnl, NOSE-clear, THROAT-clear, no lesions, no postnasal drip or exudate noted.   NECK:  Supple w/ fair ROM; no JVD; normal carotid impulses w/o bruits; no thyromegaly or nodules palpated; no lymphadenopathy.    RESP  Clear  P & A; w/o, wheezes/ rales/ or rhonchi. no accessory muscle use, no dullness to percussion  CARD:  RRR, no m/r/g, tr peripheral edema, pulses intact, no cyanosis or clubbing. Negative Homans' sign  GI:   Soft & nt; nml bowel sounds; no organomegaly or masses detected.   Musco: Warm bil, no deformities or joint swelling noted.   Neuro: alert, no focal deficits noted.    Skin: Warm, no lesions or rashes    Lab Results:  CBC  BMET  BNP No results found for: BNP  ProBNP No results found for: PROBNP  Imaging: DG Chest 2 View  Result Date:  02/28/2020 CLINICAL DATA:  Shortness of breath.  Metastatic prostate carcinoma. EXAM: CHEST - 2 VIEW COMPARISON:  11/25/2019 FINDINGS: Right-sided power port remains in place. Heart size is normal. Decreased lung volumes are seen bilaterally. New small right pleural effusion is seen, with increased atelectasis or infiltrate in the right mid and lower lung zones. Numerous tiny nodular opacities are again seen throughout both lungs, consistent with metastatic disease. Diffuse sclerotic bone metastases also noted. IMPRESSION: New small right pleural effusion, and increased atelectasis or infiltrate in right mid and lower lung zones. Diffuse bilateral pulmonary nodularity, consistent with metastatic disease. Diffuse sclerotic bone metastases. Electronically Signed   By: Marlaine Hind M.D.   On:  02/28/2020 15:56    No flowsheet data found.  No results found for: NITRICOXIDE      Assessment & Plan:   Pneumonia Right sided PNA - immunosuppressed patient . Appears stable with no increased oxygen demands Begin Augmentin  Close .follow up in  1 week   Plan  Patient Instructions  Begin Augmentin 875mg  Twice daily  For 7 days ,take with food.  Mucinex DM Twice daily  As needed  Congestion.  Continue on Oxygen 2l/m .  Venous Doppler of left leg.  Follow up in 1 week with Dr. Hermina Staggers  and As needed   Please contact office for sooner follow up if symptoms do not improve or worsen or seek emergency care       Pain of left calf Mild left calf pain x 3-4 months ? Etiology  Known Cancer  Check venous dopplers.   Chronic respiratory failure with hypoxia (HCC) No increased oxygen demands  Cont on O2 2l/m .      Rexene Edison, NP 02/28/2020

## 2020-02-28 NOTE — Telephone Encounter (Signed)
Called and spoke with pt who states that he has had increased SOB x1.5 weeks, has an occ wheeze, and also has a productive cough with clear phlegm. Pt said that his wheeze and cough is the same as he has had before. Due to pt having lung cancer, he believes that this could be part of why his breathing has become worse but he also wonders if he might have fluid on lungs.  Pt denies any complaints of fever. Pt states that he has had to use rescue inhaler twice daily to help with his symptoms.  Stated to pt since his breathing has been worse x1.5 weeks that we need to get him scheduled for an OV and he verbalized understanding. Pt has been scheduled an appt today at 3:30 with TP. Nothing further needed.

## 2020-02-28 NOTE — Assessment & Plan Note (Signed)
No increased oxygen demands  Cont on O2 2l/m .

## 2020-03-02 ENCOUNTER — Ambulatory Visit (HOSPITAL_COMMUNITY)
Admission: RE | Admit: 2020-03-02 | Discharge: 2020-03-02 | Disposition: A | Discharge status: Home / Self Care | Payer: Medicare Other | Source: Ambulatory Visit | Attending: Adult Health | Admitting: Adult Health

## 2020-03-02 ENCOUNTER — Other Ambulatory Visit: Payer: Self-pay

## 2020-03-02 ENCOUNTER — Telehealth: Payer: Self-pay | Admitting: Adult Health

## 2020-03-02 DIAGNOSIS — R52 Pain, unspecified: Secondary | ICD-10-CM | POA: Diagnosis present

## 2020-03-02 NOTE — Progress Notes (Signed)
Left lower extremity venous duplex has been completed. Preliminary results can be found in CV Proc through chart review.  Results were faxed to [place] NP.  03/02/20 10:35 AM [name] Mathews RVT

## 2020-03-02 NOTE — Telephone Encounter (Signed)
Vascular report from [facility]:  Summary:  RIGHT:  - No evidence of common femoral vein obstruction.    LEFT:  - There is no evidence of deep vein thrombosis in the lower extremity.    - No cystic structure found in the popliteal fossa.    *See table(s) above for measurements and observations.   Tammy, Just making you aware, report called from [facility].  Thank you.

## 2020-03-06 ENCOUNTER — Other Ambulatory Visit: Payer: Self-pay

## 2020-03-06 ENCOUNTER — Ambulatory Visit: Payer: Medicare Other | Admitting: Adult Health

## 2020-03-06 ENCOUNTER — Inpatient Hospital Stay (HOSPITAL_BASED_OUTPATIENT_CLINIC_OR_DEPARTMENT_OTHER)
Admission: RE | Admit: 2020-03-06 | Discharge: 2020-03-06 | Disposition: A | Discharge status: Home / Self Care | Payer: Medicare Other | Source: Ambulatory Visit | Attending: Adult Health | Admitting: Adult Health

## 2020-03-06 ENCOUNTER — Encounter: Payer: Self-pay | Admitting: Adult Health

## 2020-03-06 VITALS — BP 134/76 | HR 89 | Temp 97.6°F | Ht 65.0 in | Wt 139.8 lb

## 2020-03-06 DIAGNOSIS — R079 Chest pain, unspecified: Secondary | ICD-10-CM

## 2020-03-06 DIAGNOSIS — R0600 Dyspnea, unspecified: Secondary | ICD-10-CM | POA: Diagnosis not present

## 2020-03-06 DIAGNOSIS — J90 Pleural effusion, not elsewhere classified: Secondary | ICD-10-CM | POA: Diagnosis not present

## 2020-03-06 DIAGNOSIS — J9611 Chronic respiratory failure with hypoxia: Secondary | ICD-10-CM | POA: Diagnosis not present

## 2020-03-06 DIAGNOSIS — J189 Pneumonia, unspecified organism: Secondary | ICD-10-CM | POA: Diagnosis not present

## 2020-03-06 DIAGNOSIS — J9621 Acute and chronic respiratory failure with hypoxia: Secondary | ICD-10-CM | POA: Diagnosis not present

## 2020-03-06 LAB — BASIC METABOLIC PANEL
BUN: 16 mg/dL (ref 6–23)
CO2: 37 mEq/L — ABNORMAL HIGH (ref 19–32)
Calcium: 8.9 mg/dL (ref 8.4–10.5)
Chloride: 99 mEq/L (ref 96–112)
Creatinine, Ser: 0.67 mg/dL (ref 0.40–1.50)
GFR: 140.11 mL/min (ref >60.00)
Glucose, Bld: 82 mg/dL (ref 70–99)
Potassium: 4.8 mEq/L (ref 3.5–5.1)
Sodium: 139 mEq/L (ref 135–145)

## 2020-03-06 LAB — BRAIN NATRIURETIC PEPTIDE: Pro B Natriuretic peptide (BNP): 78 pg/mL (ref 0.0–100.0)

## 2020-03-06 MED ORDER — IOHEXOL 350 MG/ML SOLN
100.0000 mL | Freq: Once | INTRAVENOUS | Status: AC | PRN
  Administered 2020-03-06: 100 mL via INTRAVENOUS

## 2020-03-06 NOTE — Assessment & Plan Note (Signed)
Continue on oxygen.  No increased oxygen demands.  Plan  Patient Instructions  Set up for CT chest.  Labs today .  Finish Augmentin 875mg   Sputum culture.  Mucinex DM Twice daily  As needed  Congestion.  Continue on Oxygen 2l/m .  Albuterol inhaler As needed  Wheezing  Follow up in 2 weeks with Dr. Ander Slade and As needed   Please contact office for sooner follow up if symptoms do not improve or worsen or seek emergency care

## 2020-03-06 NOTE — Patient Instructions (Addendum)
Set up for CT chest.  Labs today .  Finish Augmentin 875mg   Sputum culture.  Mucinex DM Twice daily  As needed  Congestion.  Continue on Oxygen 2l/m .  Albuterol inhaler As needed  Wheezing  Follow up in 2 weeks with Dr. Ander Slade and As needed   Please contact office for sooner follow up if symptoms do not improve or worsen or seek emergency care

## 2020-03-06 NOTE — Progress Notes (Signed)
@Patient  ID: [name] Mathews, male    DOB: [birth year], 74 y.o.   MRN: [id number]  Chief Complaint  Patient presents with  . Follow-up    PNA     Referring provider: Shary Key, DO  HPI: 74 year old male former smoker followed for metastatic prostate cancer with lymphangitic spread (on active treatment), bilateral pleural effusions and chronic respiratory failure on oxygen  TEST/EVENTS :  CT chest 11/11/2019 grossly stable metastatic disease with bilateral parenchymal lung consolidation, likely lymphangitic spread of disease at the lung bases and bilateral pleural effusions.  Diffuse skeletal metastasis  03/06/2020 Follow up : PNA  Patient presents for a 1 week follow-up.  Patient was seen last week with a 2-week history of increased shortness of breath cough and decreased activity Tolerance.  Chest x-ray in the office showed a small right pleural effusion and increased atelectasis in the right mid and lower lungs, stable bilateral pulmonary nodularity consistent with known metastatic disease and diffuse sclerotic bone metastasis Patient was started on Augmentin .  Patient returns today without much improvement.  Says he continues to have minimum cough but significant shortness of breath and decreased activity tolerance. Patient had some left lower leg swelling.  Venous Doppler was done and was negative for DVT. Patient denies any hemoptysis, fever, loss of taste or smell.  Covid vaccines are up-to-date He remains on oxygen 2 L with no increased oxygen demands   Allergies  Allergen Reactions  . Bee Venom Anaphylaxis, Shortness Of Breath and Swelling    Tongue swelling.   . Wasp Venom Anaphylaxis, Shortness Of Breath and Swelling    Tongue swelling   . Percocet [Oxycodone-Acetaminophen] Palpitations    Immunization History  Administered Date(s) Administered  . Fluad Quad(high Dose 65+) 03/24/2019  . Influenza,inj,Quad PF,6+ Mos 03/09/2014, 06/06/2016, 05/20/2017, 02/22/2020    . PFIZER SARS-COV-2 Vaccination 08/21/2019, 09/20/2019  . Td 11/21/2016    Past Medical History:  Diagnosis Date  . Hyperlipidemia   . Hypertension   . Prostate cancer ([place])    metastasis bones  . Restless leg syndrome     Tobacco History: Social History   Tobacco Use  Smoking Status Former Smoker  . Packs/day: 0.25  . Years: 6.00  . Pack years: 1.50  . Start date: 65  . Quit date: 24  . Years since quitting: 46.7  Smokeless Tobacco Never Used   Counseling given: Not Answered   Outpatient Medications Prior to Visit  Medication Sig Dispense Refill  . albuterol (VENTOLIN HFA) 108 (90 Base) MCG/ACT inhaler Inhale 1-2 puffs into the lungs every 6 (six) hours as needed for wheezing or shortness of breath. 18 g 2  . amoxicillin-clavulanate (AUGMENTIN) 875-125 MG tablet Take 1 tablet by mouth 2 (two) times daily for 7 days. 14 tablet 0  . denosumab (XGEVA) 120 MG/1.7ML SOLN Inject 120 mg into the skin every 30 (thirty) days.    . diphenoxylate-atropine (LOMOTIL) 2.5-0.025 MG tablet Take 1 tablet by mouth 4 (four) times daily as needed for diarrhea or loose stools. 30 tablet 0  . gabapentin (NEURONTIN) 300 MG capsule Take 1 capsule (300 mg total) by mouth at bedtime. 90 capsule 3  . hydrochlorothiazide (HYDRODIURIL) 25 MG tablet Take 1 tablet (25 mg total) by mouth daily. 90 tablet 3  . leuprolide (LUPRON) 30 MG injection Inject 30 mg into the muscle every 4 (four) months.    . mirtazapine (REMERON) 15 MG tablet Take 1 tablet (15 mg total) by mouth at bedtime. [place]  tablet 2  . Nutritional Supplements (ENSURE ACTIVE HIGH PROTEIN) LIQD Use 3 cans daily. 2844 mL 3  . Opium 10 MG/ML (1%) TINC Take 0.6 mLs (6 mg total) by mouth every 4 (four) hours as needed for diarrhea or loose stools. 118 mL 0  . prochlorperazine (COMPAZINE) 10 MG tablet TAKE 1 TABLET BY MOUTH EVERY 6 HOURS AS NEEDED FOR NAUSEA FOR VOMITING (Patient taking differently: Take 10 mg by mouth every 6 (six) hours as  needed for nausea or vomiting. ) 30 tablet 3   Facility-Administered Medications Prior to Visit  Medication Dose Route Frequency Provider Last Rate Last Admin  . sodium chloride flush (NS) 0.9 % injection 10 mL  10 mL Intracatheter PRN [name], MD   10 mL at 09/07/19 0954     Review of Systems:   Constitutional:   No  weight loss, night sweats,  Fevers, chills,  +fatigue, or  lassitude.  HEENT:   No headaches,  Difficulty swallowing,  Tooth/dental problems, or  Sore throat,                No sneezing, itching, ear ache, nasal congestion, post nasal drip,   CV:  No chest pain,  Orthopnea, PND, swelling in lower extremities, anasarca, dizziness, palpitations, syncope.   GI  No heartburn, indigestion, abdominal pain, nausea, vomiting, diarrhea, change in bowel habits, loss of appetite, bloody stools.   Resp:   No chest wall deformity  Skin: no rash or lesions.  GU: no dysuria, change in color of urine, no urgency or frequency.  No flank pain, no hematuria   MS:  No joint pain or swelling.  No decreased range of motion.  No back pain.    Physical Exam  BP 134/76 (BP Location: Left Arm, Cuff Size: Normal)   Pulse 89   Temp 97.6 F (36.4 C) (Temporal)   Ht 5\' 5"  (1.651 m)   Wt 139 lb 12.8 oz (63.4 kg)   SpO2 97% Comment: 2 liters  BMI 23.26 kg/m   GEN: A/Ox3; pleasant , NAD, elderly chronically ill-appearing   HEENT:  [place]/AT,  NOSE-clear, THROAT-clear, no lesions, no postnasal drip or exudate noted.   NECK:  Supple w/ fair ROM; no JVD; normal carotid impulses w/o bruits; no thyromegaly or nodules palpated; no lymphadenopathy.    RESP  Clear  P & A; w/o, wheezes/ rales/ or rhonchi. no accessory muscle use, no dullness to percussion  CARD:  RRR, no m/r/g, no peripheral edema, pulses intact, no cyanosis or clubbing.  GI:   Soft & nt; nml bowel sounds; no organomegaly or masses detected.   Musco: Warm bil, no deformities or joint swelling noted.   Neuro: alert, no  focal deficits noted.    Skin: Warm, no lesions or rashes    Lab Results:  CBC    Component Value Date/Time   WBC 10.6 (H) 02/21/2020 0912   WBC 8.0 10/05/2019 2047   RBC 4.03 (L) 02/21/2020 0912   HGB 9.9 (L) 02/21/2020 0912   HGB 10.8 (L) 05/20/2017 1425   HCT 33.5 (L) 02/21/2020 0912   HCT 33.7 (L) 05/20/2017 1425   PLT 290 02/21/2020 0912   PLT 200 05/20/2017 1425   MCV 83.1 02/21/2020 0912   MCV 81.6 05/20/2017 1425   MCH 24.6 (L) 02/21/2020 0912   MCHC 29.6 (L) 02/21/2020 0912   RDW 21.5 (H) 02/21/2020 0912   RDW 14.5 05/20/2017 1425   LYMPHSABS 0.8 02/21/2020 0912   LYMPHSABS 1.0  05/20/2017 1425   MONOABS 0.8 02/21/2020 0912   MONOABS 0.3 05/20/2017 1425   EOSABS 0.0 02/21/2020 0912   EOSABS 0.1 05/20/2017 1425   BASOSABS 0.0 02/21/2020 0912   BASOSABS 0.0 05/20/2017 1425    BMET    Component Value Date/Time   NA 140 02/21/2020 0912   NA 141 05/20/2017 1425   K 4.2 02/21/2020 0912   K 4.3 05/20/2017 1425   CL 105 02/21/2020 0912   CL 105 12/08/2012 1504   CO2 33 (H) 02/21/2020 0912   CO2 31 (H) 05/20/2017 1425   GLUCOSE 96 02/21/2020 0912   GLUCOSE 120 05/20/2017 1425   GLUCOSE 114 (H) 12/08/2012 1504   BUN 17 02/21/2020 0912   BUN 17.8 05/20/2017 1425   CREATININE 0.66 02/21/2020 0912   CREATININE 0.9 05/20/2017 1425   CALCIUM 9.8 02/21/2020 0912   CALCIUM 9.4 05/20/2017 1425   GFRNONAA >60 02/21/2020 0912   GFRAA >60 02/21/2020 0912    BNP No results found for: BNP  ProBNP No results found for: PROBNP  Imaging: DG Chest 2 View  Result Date: 02/28/2020 CLINICAL DATA:  Shortness of breath.  Metastatic prostate carcinoma. EXAM: CHEST - 2 VIEW COMPARISON:  11/25/2019 FINDINGS: Right-sided power port remains in place. Heart size is normal. Decreased lung volumes are seen bilaterally. New small right pleural effusion is seen, with increased atelectasis or infiltrate in the right mid and lower lung zones. Numerous tiny nodular opacities are again  seen throughout both lungs, consistent with metastatic disease. Diffuse sclerotic bone metastases also noted. IMPRESSION: New small right pleural effusion, and increased atelectasis or infiltrate in right mid and lower lung zones. Diffuse bilateral pulmonary nodularity, consistent with metastatic disease. Diffuse sclerotic bone metastases. Electronically Signed   By: Marlaine Hind M.D.   On: 02/28/2020 15:56   VAS [place] LOWER EXTREMITY VENOUS (DVT)  Result Date: 03/02/2020  Lower Venous DVTStudy Indications: Swelling.  Risk Factors: Cancer. Comparison Study: No prior studies. Performing Technologist: Oliver Hum RVT  Examination Guidelines: A complete evaluation includes B-mode imaging, spectral Doppler, color Doppler, and power Doppler as needed of all accessible portions of each vessel. Bilateral testing is considered an integral part of a complete examination. Limited examinations for reoccurring indications may be performed as noted. The reflux portion of the exam is performed with the patient in reverse Trendelenburg.  +-----+---------------+---------+-----------+----------+--------------+ RIGHTCompressibilityPhasicitySpontaneityPropertiesThrombus Aging +-----+---------------+---------+-----------+----------+--------------+ CFV  Full           Yes      Yes                                 +-----+---------------+---------+-----------+----------+--------------+   +---------+---------------+---------+-----------+----------+--------------+ LEFT     CompressibilityPhasicitySpontaneityPropertiesThrombus Aging +---------+---------------+---------+-----------+----------+--------------+ CFV      Full           Yes      Yes                                 +---------+---------------+---------+-----------+----------+--------------+ SFJ      Full                                                        +---------+---------------+---------+-----------+----------+--------------+ FV Prox   Full                                                        +---------+---------------+---------+-----------+----------+--------------+  FV Mid   Full                                                        +---------+---------------+---------+-----------+----------+--------------+ FV DistalFull                                                        +---------+---------------+---------+-----------+----------+--------------+ PFV      Full                                                        +---------+---------------+---------+-----------+----------+--------------+ POP      Full           Yes      Yes                                 +---------+---------------+---------+-----------+----------+--------------+ PTV      Full                                                        +---------+---------------+---------+-----------+----------+--------------+ PERO     Full                                                        +---------+---------------+---------+-----------+----------+--------------+     Summary: RIGHT: - No evidence of common femoral vein obstruction.  LEFT: - There is no evidence of deep vein thrombosis in the lower extremity.  - No cystic structure found in the popliteal fossa.  *See table(s) above for measurements and observations. Electronically signed by Monica Martinez MD on 03/02/2020 at 4:41:32 PM.    Final     cabazitaxel (JEVTANA) 36 mg in sodium chloride 0.9 % 250 mL chemo infusion    Date Action Dose Route User   01/10/2020 1139 Rate/Dose Change  (none) Herschell Dimes, RN   01/10/2020 1139 Rate/Dose Change  (none) Herschell Dimes, RN   01/10/2020 1039 Rate/Dose Change  (none) Herschell Dimes, RN   01/10/2020 1038 New Bag/Given  Intravenous [place], Amy F, RN    cabazitaxel (JEVTANA) 36 mg in sodium chloride 0.9 % 250 mL chemo infusion    Date Action Dose Route User   01/31/2020 1118 Rate/Dose Change  (none) Lillia Corporal, RN    01/31/2020 1118 Rate/Dose Change  (none) Lillia Corporal, RN   01/31/2020 1018 Rate/Dose Change  (none) Lillia Corporal, RN   01/31/2020 1018 New Bag/Given  Intravenous Lillia Corporal, RN    cabazitaxel (JEVTANA) 36 mg in sodium chloride 0.9 % 250 mL chemo infusion    Date Action Dose Route User   02/21/2020 1311  Rate/Dose Change  (none) Manuella Ghazi, RN   02/21/2020 1310 Rate/Dose Change  (none) Manuella Ghazi, RN   02/21/2020 1210 Rate/Dose Change  (none) Manuella Ghazi, RN   02/21/2020 1209 New Bag/Given  Intravenous Manuella Ghazi, RN    denosumab (XGEVA) injection 120 mg    Date Action Dose Route User   01/31/2020 0957 Given  Subcutaneous (Left Lower Abdomen) Lillia Corporal, RN    dexamethasone (DECADRON) 10 mg in sodium chloride 0.9 % 50 mL IVPB    Date Action Dose Route User   01/10/2020 0936 Rate/Dose Change  (none) Herschell Dimes, RN   01/10/2020 1610 New Bag/Given  Intravenous Eudelia Bunch, Amy F, RN    dexamethasone (DECADRON) 10 mg in sodium chloride 0.9 % 50 mL IVPB    Date Action Dose Route User   01/31/2020 0909 Rate/Dose Change  (none) Lillia Corporal, RN   01/31/2020 0908 New Bag/Given  Intravenous Sinda Du, RN    dexamethasone (DECADRON) 10 mg in sodium chloride 0.9 % 50 mL IVPB    Date Action Dose Route User   02/21/2020 1107 Rate/Dose Change  (none) Manuella Ghazi, RN   02/21/2020 1107 New Bag/Given  Intravenous Manuella Ghazi, RN    diphenhydrAMINE (BENADRYL) injection 25 mg    Date Action Dose Route User   01/10/2020 0931 Given  Intravenous Smiley Houseman F, RN    diphenhydrAMINE (BENADRYL) injection 25 mg    Date Action Dose Route User   01/31/2020 0905 Given  Intravenous Sinda Du, RN    diphenhydrAMINE (BENADRYL) injection 25 mg    Date Action Dose Route User   02/21/2020 1048 Given  Intravenous Manuella Ghazi, RN    famotidine (PEPCID) IVPB 20 mg premix    Date Action Dose Route User   01/10/2020 1004 Rate/Dose Verify  (none) Herschell Dimes, RN   01/10/2020 9604 Rate/Dose Change  (none) Herschell Dimes, RN   01/10/2020 5409 New Bag/Given  Intravenous Smiley Houseman F, RN    famotidine (PEPCID) IVPB 20 mg premix    Date Action Dose Route User   01/31/2020 0926 Rate/Dose Change  (none) Lillia Corporal, RN   01/31/2020 8119 New Bag/Given  Intravenous Sinda Du, RN    famotidine (PEPCID) IVPB 20 mg premix    Date Action Dose Route User   02/21/2020 1105 Rate/Dose Verify  (none) Manuella Ghazi, RN   02/21/2020 1051 Rate/Dose Change  (none) Manuella Ghazi, RN   02/21/2020 1051 New Bag/Given  Intravenous Manuella Ghazi, RN    heparin lock flush 100 unit/mL    Date Action Dose Route User   01/10/2020 1148 Given  [place], [place] F, RN    heparin lock flush 100 unit/mL    Date Action Dose Route User   01/31/2020 1125 Given  Intracatheter Jonnie Finner D, RN    heparin lock flush 100 unit/mL    Date Action Dose Route User   02/21/2020 1320 Given  Intracatheter Manuella Ghazi, RN    pegfilgrastim-jmdb (FULPHILA) injection 6 mg    Date Action Dose Route User   01/12/2020 1022 Given  Subcutaneous (Left Arm) [place] A, LPN    pegfilgrastim-jmdb (FULPHILA) injection 6 mg    Date Action Dose Route User   02/02/2020 1137 Given  Subcutaneous (Right Arm) Kelli Hope, LPN    pegfilgrastim-jmdb (FULPHILA) injection 6 mg    Date Action Dose Route  User   02/23/2020 1341 Given  Subcutaneous (Right Arm) Murlean Hark H, LPN    0.9 %  sodium chloride infusion    Date Action Dose Route User   01/10/2020 1152 Rate/Dose Change  (none) Herschell Dimes, RN   01/10/2020 1142 Rate/Dose Change  (none) Herschell Dimes, RN   01/10/2020 1039 Rate/Dose Verify  (none) Herschell Dimes, RN   01/10/2020 1033 Rate/Dose Verify  (none) Herschell Dimes, RN   01/10/2020 1004 Rate/Dose Verify  (none) Eudelia Bunch, Amy F, RN    0.9 %  sodium chloride infusion    Date Action Dose Route User   01/31/2020 1125  Rate/Dose Change  (none) Lillia Corporal, RN   01/31/2020 1120 Rate/Dose Change  (none) Lillia Corporal, RN   01/31/2020 1014 Rate/Dose Verify  (none) Lillia Corporal, RN   01/31/2020 1013 Rate/Dose Verify  (none) Lillia Corporal, RN   01/31/2020 [id number] Rate/Dose Verify  (none) Lillia Corporal, RN    0.9 %  sodium chloride infusion    Date Action Dose Route User   02/21/2020 1318 Rate/Dose Verify  (none) Manuella Ghazi, RN   02/21/2020 1317 Rate/Dose Change  (none) Manuella Ghazi, RN   02/21/2020 1313 Rate/Dose Change  (none) Manuella Ghazi, RN   02/21/2020 1209 Rate/Dose Verify  (none) Manuella Ghazi, RN   02/21/2020 1204 Rate/Dose Verify  (none) Manuella Ghazi, RN    sodium chloride flush (NS) 0.9 % injection 10 mL    Date Action Dose Route User   01/10/2020 0807 Given  Intracatheter Alvino Blood E, LPN    sodium chloride flush (NS) 0.9 % injection 10 mL    Date Action Dose Route User   01/10/2020 1148 Given  Intracatheter [place], Amy F, RN    sodium chloride flush (NS) 0.9 % injection 10 mL    Date Action Dose Route User   01/31/2020 0808 Given  Intracatheter Cates, Porsche L, LPN    sodium chloride flush (NS) 0.9 % injection 10 mL    Date Action Dose Route User   01/31/2020 1125 Given  Intracatheter Jonnie Finner D, RN    sodium chloride flush (NS) 0.9 % injection 10 mL    Date Action Dose Route User   02/21/2020 0912 Given  Intracatheter Britt Boozer N, LPN    sodium chloride flush (NS) 0.9 % injection 10 mL    Date Action Dose Route User   02/21/2020 1319 Given  Intracatheter Manuella Ghazi, RN      No flowsheet data found.  No results found for: NITRICOXIDE      Assessment & Plan:   Pneumonia Possible right-sided pneumonia.  No significant improvement after antibiotics.  Patient does not appear toxic on exam.  Will check labs including a BNP as this could be representative underlying fluid overload. Patient is high risk for worsening metastatic disease,  pleural effusion, pneumonitis  and or PE.  We will set patient up for a repeat CT chest PE protocol Finished antibiotic.  Check sputum culture as patient is immunosuppressed.    Plan  Patient Instructions  Set up for CT chest.  Labs today .  Finish Augmentin 875mg   Sputum culture.  Mucinex DM Twice daily  As needed  Congestion.  Continue on Oxygen 2l/m .  Albuterol inhaler As needed  Wheezing  Follow up in 2 weeks with Dr. Ander Slade and As needed   Please contact office for sooner follow up if symptoms do  not improve or worsen or seek emergency care       Chronic respiratory failure with hypoxia ([place]) Continue on oxygen.  No increased oxygen demands.  Plan  Patient Instructions  Set up for CT chest.  Labs today .  Finish Augmentin 875mg   Sputum culture.  Mucinex DM Twice daily  As needed  Congestion.  Continue on Oxygen 2l/m .  Albuterol inhaler As needed  Wheezing  Follow up in 2 weeks with Dr. Ander Slade and As needed   Please contact office for sooner follow up if symptoms do not improve or worsen or seek emergency care          Rexene Edison, NP 03/06/2020

## 2020-03-06 NOTE — Assessment & Plan Note (Addendum)
Possible right-sided pneumonia.  No significant improvement after antibiotics.  Patient does not appear toxic on exam.  Will check labs including a BNP as this could be representative underlying fluid overload. Patient is high risk for worsening metastatic disease, pleural effusion, pneumonitis  and or PE.  We will set patient up for a repeat CT chest PE protocol Finished antibiotic.  Check sputum culture as patient is immunosuppressed.    Plan  Patient Instructions  Set up for CT chest.  Labs today .  Finish Augmentin 875mg   Sputum culture.  Mucinex DM Twice daily  As needed  Congestion.  Continue on Oxygen 2l/m .  Albuterol inhaler As needed  Wheezing  Follow up in 2 weeks with Dr. Ander Slade and As needed   Please contact office for sooner follow up if symptoms do not improve or worsen or seek emergency care

## 2020-03-07 ENCOUNTER — Other Ambulatory Visit: Payer: Self-pay

## 2020-03-07 ENCOUNTER — Emergency Department (HOSPITAL_COMMUNITY): Payer: Medicare Other

## 2020-03-07 ENCOUNTER — Inpatient Hospital Stay (HOSPITAL_COMMUNITY)
Admission: EM | Admit: 2020-03-07 | Discharge: 2020-03-09 | DRG: 186 | Disposition: A | Discharge status: Home / Self Care | Payer: Medicare Other | Attending: Family Medicine | Admitting: Family Medicine

## 2020-03-07 ENCOUNTER — Encounter (HOSPITAL_COMMUNITY): Payer: Self-pay

## 2020-03-07 ENCOUNTER — Telehealth: Payer: Self-pay | Admitting: Adult Health

## 2020-03-07 DIAGNOSIS — Z9889 Other specified postprocedural states: Secondary | ICD-10-CM

## 2020-03-07 DIAGNOSIS — J90 Pleural effusion, not elsewhere classified: Principal | ICD-10-CM | POA: Diagnosis present

## 2020-03-07 DIAGNOSIS — Z20822 Contact with and (suspected) exposure to covid-19: Secondary | ICD-10-CM | POA: Diagnosis present

## 2020-03-07 DIAGNOSIS — Z87891 Personal history of nicotine dependence: Secondary | ICD-10-CM | POA: Diagnosis not present

## 2020-03-07 DIAGNOSIS — C61 Malignant neoplasm of prostate: Secondary | ICD-10-CM | POA: Diagnosis present

## 2020-03-07 DIAGNOSIS — M79605 Pain in left leg: Secondary | ICD-10-CM | POA: Diagnosis present

## 2020-03-07 DIAGNOSIS — Z9221 Personal history of antineoplastic chemotherapy: Secondary | ICD-10-CM

## 2020-03-07 DIAGNOSIS — I10 Essential (primary) hypertension: Secondary | ICD-10-CM | POA: Diagnosis present

## 2020-03-07 DIAGNOSIS — G4733 Obstructive sleep apnea (adult) (pediatric): Secondary | ICD-10-CM | POA: Diagnosis present

## 2020-03-07 DIAGNOSIS — Z8249 Family history of ischemic heart disease and other diseases of the circulatory system: Secondary | ICD-10-CM | POA: Diagnosis not present

## 2020-03-07 DIAGNOSIS — C7951 Secondary malignant neoplasm of bone: Secondary | ICD-10-CM | POA: Diagnosis present

## 2020-03-07 DIAGNOSIS — G2581 Restless legs syndrome: Secondary | ICD-10-CM | POA: Diagnosis present

## 2020-03-07 DIAGNOSIS — J189 Pneumonia, unspecified organism: Secondary | ICD-10-CM | POA: Diagnosis present

## 2020-03-07 DIAGNOSIS — Z8546 Personal history of malignant neoplasm of prostate: Secondary | ICD-10-CM | POA: Diagnosis not present

## 2020-03-07 DIAGNOSIS — R600 Localized edema: Secondary | ICD-10-CM | POA: Diagnosis present

## 2020-03-07 DIAGNOSIS — Z66 Do not resuscitate: Secondary | ICD-10-CM | POA: Diagnosis present

## 2020-03-07 DIAGNOSIS — Z91030 Bee allergy status: Secondary | ICD-10-CM

## 2020-03-07 DIAGNOSIS — J9621 Acute and chronic respiratory failure with hypoxia: Secondary | ICD-10-CM | POA: Diagnosis present

## 2020-03-07 DIAGNOSIS — Z885 Allergy status to narcotic agent status: Secondary | ICD-10-CM | POA: Diagnosis not present

## 2020-03-07 DIAGNOSIS — K529 Noninfective gastroenteritis and colitis, unspecified: Secondary | ICD-10-CM | POA: Diagnosis present

## 2020-03-07 DIAGNOSIS — Z79899 Other long term (current) drug therapy: Secondary | ICD-10-CM

## 2020-03-07 DIAGNOSIS — E785 Hyperlipidemia, unspecified: Secondary | ICD-10-CM | POA: Diagnosis present

## 2020-03-07 DIAGNOSIS — I2721 Secondary pulmonary arterial hypertension: Secondary | ICD-10-CM | POA: Diagnosis present

## 2020-03-07 DIAGNOSIS — C7800 Secondary malignant neoplasm of unspecified lung: Secondary | ICD-10-CM | POA: Diagnosis present

## 2020-03-07 DIAGNOSIS — J910 Malignant pleural effusion: Secondary | ICD-10-CM

## 2020-03-07 DIAGNOSIS — J188 Other pneumonia, unspecified organism: Secondary | ICD-10-CM

## 2020-03-07 LAB — CBC WITH DIFFERENTIAL/PLATELET
Abs Immature Granulocytes: 0.17 10*3/uL — ABNORMAL HIGH (ref 0.00–0.07)
Basophils Absolute: 0 10*3/uL (ref 0.0–0.1)
Basophils Relative: 0 %
Eosinophils Absolute: 0.1 10*3/uL (ref 0.0–0.5)
Eosinophils Relative: 0 %
HCT: 37.7 % — ABNORMAL LOW (ref 39.0–52.0)
Hemoglobin: 11 g/dL — ABNORMAL LOW (ref 13.0–17.0)
Immature Granulocytes: 2 %
Lymphocytes Relative: 8 %
Lymphs Abs: 0.9 10*3/uL (ref 0.7–4.0)
MCH: 25.8 pg — ABNORMAL LOW (ref 26.0–34.0)
MCHC: 29.2 g/dL — ABNORMAL LOW (ref 30.0–36.0)
MCV: 88.3 fL (ref 80.0–100.0)
Monocytes Absolute: 0.6 10*3/uL (ref 0.1–1.0)
Monocytes Relative: 6 %
Neutro Abs: 9.4 10*3/uL — ABNORMAL HIGH (ref 1.7–7.7)
Neutrophils Relative %: 84 %
Platelets: 302 10*3/uL (ref 150–400)
RBC: 4.27 MIL/uL (ref 4.22–5.81)
RDW: 20.6 % — ABNORMAL HIGH (ref 11.5–15.5)
WBC: 11.2 10*3/uL — ABNORMAL HIGH (ref 4.0–10.5)
nRBC: 0.2 % (ref 0.0–0.2)

## 2020-03-07 LAB — COMPREHENSIVE METABOLIC PANEL
ALT: 42 U/L (ref 0–44)
AST: 44 U/L — ABNORMAL HIGH (ref 15–41)
Albumin: 3.4 g/dL — ABNORMAL LOW (ref 3.5–5.0)
Alkaline Phosphatase: 106 U/L (ref 38–126)
Anion gap: 9 (ref 5–15)
BUN: 15 mg/dL (ref 8–23)
CO2: 36 mmol/L — ABNORMAL HIGH (ref 22–32)
Calcium: 8.9 mg/dL (ref 8.9–10.3)
Chloride: 98 mmol/L (ref 98–111)
Creatinine, Ser: 0.68 mg/dL (ref 0.61–1.24)
GFR calc Af Amer: >60 mL/min (ref >60)
GFR calc non Af Amer: >60 mL/min (ref >60)
Glucose, Bld: 97 mg/dL (ref 70–99)
Potassium: 4.6 mmol/L (ref 3.5–5.1)
Sodium: 143 mmol/L (ref 135–145)
Total Bilirubin: 0.3 mg/dL (ref 0.3–1.2)
Total Protein: 7.2 g/dL (ref 6.5–8.1)

## 2020-03-07 LAB — LACTIC ACID, PLASMA: Lactic Acid, Venous: 1 mmol/L (ref 0.5–1.9)

## 2020-03-07 LAB — URINALYSIS, ROUTINE W REFLEX MICROSCOPIC
Bacteria, UA: NONE SEEN
Bilirubin Urine: NEGATIVE
Glucose, UA: NEGATIVE mg/dL
Hgb urine dipstick: NEGATIVE
Ketones, ur: NEGATIVE mg/dL
Leukocytes,Ua: NEGATIVE
Nitrite: NEGATIVE
Protein, ur: 30 mg/dL — AB
Specific Gravity, Urine: 1.034 — ABNORMAL HIGH (ref 1.005–1.030)
pH: 6 (ref 5.0–8.0)

## 2020-03-07 LAB — SARS CORONAVIRUS 2 BY RT PCR (HOSPITAL ORDER, PERFORMED IN ~~LOC~~ HOSPITAL LAB): SARS Coronavirus 2: NEGATIVE

## 2020-03-07 LAB — PROTIME-INR
INR: 1 (ref 0.8–1.2)
Prothrombin Time: 12.6 seconds (ref 11.4–15.2)

## 2020-03-07 LAB — APTT: aPTT: 31 seconds (ref 24–36)

## 2020-03-07 LAB — BRAIN NATRIURETIC PEPTIDE: B Natriuretic Peptide: 87.6 pg/mL (ref 0.0–100.0)

## 2020-03-07 MED ORDER — GABAPENTIN 300 MG PO CAPS
300.0000 mg | ORAL_CAPSULE | Freq: Every day | ORAL | Status: DC
  Administered 2020-03-07 – 2020-03-08 (×2): 300 mg via ORAL
  Filled 2020-03-07 (×2): qty 1

## 2020-03-07 MED ORDER — DIPHENOXYLATE-ATROPINE 2.5-0.025 MG PO TABS: 1.0000 | ORAL_TABLET | Freq: Four times a day (QID) | ORAL | Status: DC | PRN

## 2020-03-07 MED ORDER — ENOXAPARIN SODIUM 40 MG/0.4ML ~~LOC~~ SOLN
40.0000 mg | SUBCUTANEOUS | Status: DC
  Administered 2020-03-07 – 2020-03-08 (×2): 40 mg via SUBCUTANEOUS
  Filled 2020-03-07 (×2): qty 0.4

## 2020-03-07 MED ORDER — IBUPROFEN 200 MG PO TABS: 400.0000 mg | ORAL_TABLET | Freq: Four times a day (QID) | ORAL | Status: DC | PRN

## 2020-03-07 MED ORDER — VANCOMYCIN HCL 1250 MG/250ML IV SOLN
1250.0000 mg | Freq: Once | INTRAVENOUS | Status: AC
  Administered 2020-03-07: 1250 mg via INTRAVENOUS
  Filled 2020-03-07: qty 250

## 2020-03-07 MED ORDER — ALBUTEROL SULFATE (2.5 MG/3ML) 0.083% IN NEBU: 2.5000 mg | INHALATION_SOLUTION | Freq: Four times a day (QID) | RESPIRATORY_TRACT | Status: DC | PRN

## 2020-03-07 MED ORDER — DM-GUAIFENESIN ER 30-600 MG PO TB12: 1.0000 | ORAL_TABLET | Freq: Two times a day (BID) | ORAL | Status: DC | PRN

## 2020-03-07 MED ORDER — MIRTAZAPINE 15 MG PO TABS
15.0000 mg | ORAL_TABLET | Freq: Every day | ORAL | Status: DC
  Administered 2020-03-07 – 2020-03-08 (×2): 15 mg via ORAL
  Filled 2020-03-07: qty 1
  Filled 2020-03-07: qty 2

## 2020-03-07 MED ORDER — METOPROLOL TARTRATE 5 MG/5ML IV SOLN: 5.0000 mg | Freq: Four times a day (QID) | INTRAVENOUS | Status: DC | PRN

## 2020-03-07 MED ORDER — HYDROCHLOROTHIAZIDE 25 MG PO TABS
25.0000 mg | ORAL_TABLET | Freq: Every day | ORAL | Status: DC
  Administered 2020-03-07 – 2020-03-09 (×3): 25 mg via ORAL
  Filled 2020-03-07 (×3): qty 1

## 2020-03-07 MED ORDER — SODIUM CHLORIDE 0.9 % IV SOLN
2.0000 g | Freq: Once | INTRAVENOUS | Status: AC
  Administered 2020-03-07: 2 g via INTRAVENOUS
  Filled 2020-03-07: qty 2

## 2020-03-07 MED ORDER — PROMETHAZINE HCL 25 MG PO TABS: 12.5000 mg | ORAL_TABLET | Freq: Four times a day (QID) | ORAL | Status: DC | PRN

## 2020-03-07 MED ORDER — ALBUTEROL SULFATE HFA 108 (90 BASE) MCG/ACT IN AERS: 1.0000 | INHALATION_SPRAY | Freq: Four times a day (QID) | RESPIRATORY_TRACT | Status: DC | PRN

## 2020-03-07 NOTE — Progress Notes (Signed)
A consult was received from an ED physician for vancomycin per pharmacy dosing.  The patient's profile has been reviewed for ht/wt/allergies/indication/available labs.    A one time order has been placed for 1250 mg IV x1.  Further antibiotics/pharmacy consults should be ordered by admitting physician if indicated.                       Thank you, Lynelle Doctor 03/07/2020  12:48 PM

## 2020-03-07 NOTE — ED Provider Notes (Signed)
[name] DEPT Provider Note   CSN: [id number] Arrival date & time: 03/07/20  1156     History Chief Complaint  Patient presents with  . Shortness of Breath    [name] Mathews is a 74 y.o. male.  The history is provided by the patient, the EMS personnel and medical records. No language interpreter was used.  Shortness of Breath  [name] Mathews is a 74 y.o. male who presents to the Emergency Department complaining of shortness of breath. He presents the emergency department by EMS for evaluation of progressive shortness of breath over the last three weeks. He has a history of metastatic prostate cancer. He reports cough productive of clear sputum with dyspnea on exertion. He does have some chest pain associated with this cough. No fevers, nausea, vomiting. He does have some left leg pain. He was recently treated with Augmentin with no significant improvement in his symptoms. He had an outpatient CT PE study performed yesterday that demonstrated multifocal pneumonia and he was told to come in for further evaluation due to his worsening dyspnea. He has been fully vaccinated for COVID-19. No known sick contacts. EMS reports hypoxia with exertion to the low 80s.    Past Medical History:  Diagnosis Date  . Hyperlipidemia   . Hypertension   . Prostate cancer ([place])    metastasis bones  . Restless leg syndrome     Patient Active Problem List   Diagnosis Date Noted  . Acute on chronic respiratory failure with hypoxia ([place]) 03/07/2020  . Pneumonia 02/28/2020  . Pain of left calf 02/28/2020  . Chronic respiratory failure with hypoxia ([place]) 02/28/2020  . 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 Loss of appetite 02/12/2020  . Obstructive sleep apnea 09/18/2019  . Seasonal allergies 09/18/2019  . Port-A-Cath in place 03/24/2019  . Goals of care, counseling/discussion 01/05/2019  . History of cluster headache 08/12/2017  . Neuropathic pain  02/09/2017  . Routine adult health maintenance 01/08/2017  . Essential hypertension 01/08/2017  . Hyperlipidemia 01/08/2017  . Cervical lymphadenopathy 01/08/2017  . Back pain 09/08/2014  . 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 IR CV LINE INJECTION  02/15/2019  . IR IMAGING GUIDED PORT INSERTION  01/11/2019  . IR THORACENTESIS ASP PLEURAL SPACE W/IMG GUIDE  11/25/2019  . PELVIC LYMPH NODE DISSECTION     prostate remains intact       Family History  Problem Relation Age of Onset  . Heart attack Mother 78       died of heart attack  . Cervical cancer Sister 39  . Stroke Brother 93    Social History   Tobacco Use  . Smoking status: Former Smoker    Packs/day: 0.25    Years: 6.00    Pack years: 1.50    Start date: 1969    Quit date: 1975    Years since quitting: 46.7  . Smokeless tobacco: Never Used  Vaping Use  . Vaping Use: Never used  Substance Use Topics  . Alcohol use: No  . Drug use: No    Home Medications Prior to Admission medications   Medication Sig Start Date End Date Taking? Authorizing Provider  albuterol (VENTOLIN HFA) 108 (90 Base) MCG/ACT inhaler Inhale 1-2 puffs into the lungs every 6 (six) hours as needed for wheezing or shortness of breath. 01/23/20  Yes Olalere, Adewale A, MD  denosumab (XGEVA) 120 MG/1.7ML SOLN Inject  120 mg into the skin every 30 (thirty) days.   Yes [provider]  dextromethorphan-guaiFENesin (MUCINEX DM) 30-600 MG 12hr tablet Take 1 tablet by mouth 2 (two) times daily as needed for cough.   Yes [provider]  diphenoxylate-atropine (LOMOTIL) 2.5-0.025 MG tablet Take 1 tablet by mouth 4 (four) times daily as needed for diarrhea or loose stools. 12/01/19  Yes [name], MD  gabapentin (NEURONTIN) 300 MG capsule Take 1 capsule (300 mg total) by mouth at bedtime. 08/17/19  Yes [name], MD  hydrochlorothiazide (HYDRODIURIL) 25 MG tablet Take  1 tablet (25 mg total) by mouth daily. 08/22/19  Yes Guadalupe Dawn, MD  leuprolide (LUPRON) 30 MG injection Inject 30 mg into the muscle every 4 (four) months.   Yes [provider]  mirtazapine (REMERON) 15 MG tablet Take 1 tablet (15 mg total) by mouth at bedtime. 02/10/20  Yes Autry-Lott, Naaman Plummer, DO  Nutritional Supplements (ENSURE ACTIVE HIGH PROTEIN) LIQD Use 3 cans daily. 01/31/20  Yes [name], [name], MD  OVER THE COUNTER MEDICATION Take 5 mLs by mouth daily as needed (cough). Cheritussin cough syrup   Yes [provider]  prochlorperazine (COMPAZINE) 10 MG tablet TAKE 1 TABLET BY MOUTH EVERY 6 HOURS AS NEEDED FOR NAUSEA FOR VOMITING Patient taking differently: Take 10 mg by mouth every 6 (six) hours as needed for nausea or vomiting.  06/14/19  Yes [name], [name], MD  Opium 10 MG/ML (1%) TINC Take 0.6 mLs (6 mg total) by mouth every 4 (four) hours as needed for diarrhea or loose stools. Patient not taking: Reported on 03/07/2020 12/05/19   [name], MD    Allergies    Bee venom, Wasp venom, and Percocet [oxycodone-acetaminophen]  Review of Systems   Review of Systems  Respiratory: Positive for shortness of breath.   All other systems reviewed and are negative.   Physical Exam Updated Vital Signs BP 131/82   Pulse 78   Temp 98.7 F (37.1 C) (Oral)   Resp (!) 21   Ht 5\' 5"  (1.651 m)   Wt 63 kg   SpO2 100%   BMI 23.13 kg/m   Physical Exam Vitals and nursing note reviewed.  Constitutional:      Appearance: He is well-developed.  HENT:     Head: Normocephalic and atraumatic.  Cardiovascular:     Rate and Rhythm: Normal rate and regular rhythm.     Heart sounds: No murmur heard.   Pulmonary:     Effort: Pulmonary effort is normal. No respiratory distress.     Comments: bibasilar crackles Abdominal:     Palpations: Abdomen is soft.     Tenderness: There is no abdominal tenderness. There is no guarding or rebound.  Musculoskeletal:         General: No tenderness.     Comments: Trace nonpitting edema to BLE  Skin:    General: Skin is warm and dry.  Neurological:     Mental Status: He is alert and oriented to person, place, and time.  Psychiatric:        Behavior: Behavior normal.     ED Results / Procedures / Treatments   Labs (all labs ordered are listed, but only abnormal results are displayed) Labs Reviewed  COMPREHENSIVE METABOLIC PANEL - Abnormal; Notable for the following components:      Result Value   CO2 36 (*)    Albumin 3.4 (*)    AST 44 (*)  All other components within normal limits  CBC WITH DIFFERENTIAL/PLATELET - Abnormal; Notable for the following components:   WBC 11.2 (*)    Hemoglobin 11.0 (*)    HCT 37.7 (*)    MCH 25.8 (*)    MCHC 29.2 (*)    RDW 20.6 (*)    Neutro Abs 9.4 (*)    Abs Immature Granulocytes 0.17 (*)    All other components within normal limits  SARS CORONAVIRUS 2 BY RT PCR (HOSPITAL ORDER, [place] [facility])  CULTURE, BLOOD (SINGLE)  URINE CULTURE  LACTIC ACID, PLASMA  PROTIME-INR  APTT  BRAIN NATRIURETIC PEPTIDE  URINALYSIS, ROUTINE W REFLEX MICROSCOPIC  CBC  CREATININE, SERUM    EKG None  Radiology CT Angio Chest W/Cm &/Or Wo Cm  Result Date: 03/06/2020 CLINICAL DATA:  Shortness of breath.  Prostate carcinoma EXAM: CT ANGIOGRAPHY CHEST WITH CONTRAST TECHNIQUE: Multidetector CT imaging of the chest was performed using the standard protocol during bolus administration of intravenous contrast. Multiplanar CT image reconstructions and MIPs were obtained to evaluate the vascular anatomy. CONTRAST:  146mL OMNIPAQUE IOHEXOL 350 MG/ML SOLN COMPARISON:  Chest radiograph February 28, 2020; chest CT Nov 11, 2019 FINDINGS: Cardiovascular: There is no demonstrable pulmonary embolus. There is no thoracic aortic aneurysm or dissection. Visualized great vessels appear unremarkable. Note that the right innominate and left common carotid arteries arise as a  common trunk, an anatomic variant. There is aortic atherosclerosis. No pericardial effusion or pericardial thickening. There are occasional foci of coronary artery calcification. There is prominence of the main pulmonary outflow tract measuring 3.4 cm. Port-A-Cath tip in superior vena cava. Mediastinum/Nodes: Thyroid appears unremarkable. No appreciable thoracic adenopathy. No esophageal lesions. Lungs/Pleura: There are pleural effusions bilaterally, larger on the right than on the left. There is airspace consolidation in each lower lobe consistent with a combination of compressive atelectasis and apparent pneumonia. Areas of scattered airspace consolidation also noted in portions of the right middle and right upper lobes. There is a degree of underlying interstitial thickening. Upper Abdomen: Visualized upper abdominal structures appear unremarkable. Musculoskeletal: Widespread blastic bony metastases are noted throughout the thoracic region. There is extensive gynecomastia bilaterally. There is a port along the anterior right hemithorax. Review of the MIP images confirms the above findings. IMPRESSION: 1. No appreciable pulmonary embolus. No thoracic aortic aneurysm or dissection. There is aortic atherosclerosis. There are occasional foci of coronary artery calcification. 2. Sizable pleural effusions bilaterally with multifocal pneumonia, more on the right than on the left. 3.  Apparent lymphangitic spread of tumor, also present previously. 4.  Extensive bony metastatic disease diffusely. 5. Pulmonary arterial hypertension, characterized by enlargement of the main pulmonary outflow tract. 6.  No adenopathy appreciable. 7.  Gynecomastia again noted. Aortic Atherosclerosis (ICD10-I70.0). Electronically Signed   By: Lowella Grip III M.D.   On: 03/06/2020 17:11   DG Chest Port 1 View  Result Date: 03/07/2020 CLINICAL DATA:  Possible sepsis. EXAM: PORTABLE CHEST 1 VIEW COMPARISON:  02/28/2020 FINDINGS: Power  port on the right has its tip in the SVC just above the right atrium. Widespread bilateral pulmonary infiltrates persist. Bilateral pleural effusions, larger on the right than the left. Allowing for technical factors, the findings appear similar to the study of 1 week ago. Sclerotic skeletal changes consistent with known metastatic disease. IMPRESSION: Similar appearance to the study of 8 days ago. Widespread bilateral pulmonary infiltrates and bilateral pleural effusions, larger on the right than the left. Electronically Signed   By:  Nelson Chimes M.D.   On: 03/07/2020 12:36    Procedures Procedures (including critical care time)  Medications Ordered in ED Medications  hydrochlorothiazide (HYDRODIURIL) tablet 25 mg (25 mg Oral Given 03/07/20 1552)  mirtazapine (REMERON) tablet 15 mg (has no administration in time range)  diphenoxylate-atropine (LOMOTIL) 2.5-0.025 MG per tablet 1 tablet (has no administration in time range)  gabapentin (NEURONTIN) capsule 300 mg (has no administration in time range)  albuterol (VENTOLIN HFA) 108 (90 Base) MCG/ACT inhaler 1-2 puff (has no administration in time range)  dextromethorphan-guaiFENesin (MUCINEX DM) 30-600 MG per 12 hr tablet 1 tablet (has no administration in time range)  enoxaparin (LOVENOX) injection 40 mg (has no administration in time range)  ibuprofen (ADVIL) tablet 400 mg (has no administration in time range)  promethazine (PHENERGAN) tablet 12.5 mg (has no administration in time range)  metoprolol tartrate (LOPRESSOR) injection 5 mg (has no administration in time range)  ceFEPIme (MAXIPIME) 2 g in sodium chloride 0.9 % 100 mL IVPB (0 g Intravenous Stopped 03/07/20 1402)  vancomycin (VANCOREADY) IVPB 1250 mg/250 mL (1,250 mg Intravenous New Bag/Given (Non-Interop) 03/07/20 1425)    ED Course  I have reviewed the triage vital signs and the nursing notes.  Pertinent labs & imaging results that were available during my care of the patient were  reviewed by me and considered in my medical decision making (see chart for details).    MDM Rules/Calculators/A&P                         Patient with history of prostate cancer here for evaluation of progressive dyspnea on exertion. Imaging is concerning for multifocal pneumonia and he was started on broad-spectrum antibiotics. Plan to admit for further treatment. Patient updated findings of studies and recommendation for admission he is seeing agreement with treatment plan.  Final Clinical Impression(s) / ED Diagnoses Final diagnoses:  Multifocal pneumonia    Rx / DC Orders ED Discharge Orders    None       Quintella Reichert, MD 03/07/20 1559

## 2020-03-07 NOTE — Telephone Encounter (Signed)
FYI Patient called  this morning to say he was going to call 911 to be transported to [facility] .Patient is SOB and was advised is symptoms get worse to seek emergency care.  Tammy this is a Pharmacist, hospital.

## 2020-03-07 NOTE — H&P (Signed)
History and Physical        Hospital Admission Note Date: 03/07/2020  Patient name: [name] Mathews Medical record number: [id number] Date of birth: August 09, 1945 Age: 74 y.o. Gender: male  PCP: Shary Key, DO  Patient coming from: Home Lives with: Family At baseline, ambulates: Independent  Chief Complaint    Chief Complaint  Patient presents with  . 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    He was recently seen on 9/7 by outpatient pulmonary for an acute office visit for cough and DOE found to have right pleural effusion and increased atelectasis on right mid and lower lungs on CXR at that time.  He was started on Augmentin for right-sided pneumonia and had an outpatient follow-up 1 week later.  Additionally, he had left lower leg swelling and had a venous Doppler which was negative for DVT.  He was seen on 9/14 for follow-up with pulmonology without much improvement.  A CTA chest was ordered which was negative for PE but showed sizable pleural effusions bilaterally with multifocal pneumonia R> L along with evidence of pulmonary arterial hypertension and extensive bony metastatic disease.  Today, he became more short of breath and called 911 for transport to [facility].  He denies any fever, chills, nausea or vomiting.  Has chronic diarrhea.  No orthopnea, no chest pain.  Admits to intermittent left lower extremity edema.  ED Course: Tolerating baseline 2 L nasal cannula at rest but per ED physician desats on  exertion.  COVID-19 pending.  Notable labs: BNP 87, WBC 11.2 and otherwise unremarkable.  CXR: Bilateral pulmonary infiltrates and bilateral pleural effusions R>L. Started on Cefepime and Vancomycin.  Vitals:   03/07/20 1400 03/07/20 1430  BP: 134/85 131/82  Pulse: 83 78  Resp: (!) 21   Temp:    SpO2: 98% 100%     Review of Systems:  Review of Systems  Constitutional: Negative for chills and fever.  HENT: Negative.   Eyes: Negative.   Respiratory: Positive for shortness of breath and wheezing.   Cardiovascular: Negative for chest pain and palpitations.  Gastrointestinal: Negative for nausea and vomiting.  Genitourinary: Negative.   Musculoskeletal: Negative for myalgias.  Skin: Negative.   Neurological: Negative for weakness.  All other systems reviewed and are negative.   Medical/Social/Family History   Past Medical History: Past Medical History:  Diagnosis Date  . Hyperlipidemia   . Hypertension   . Prostate cancer ([name])    metastasis bones  . Restless leg syndrome     Past Surgical History:  Procedure Laterality Date  . IR CV LINE INJECTION  02/15/2019  . IR IMAGING GUIDED PORT INSERTION  01/11/2019  . IR THORACENTESIS ASP PLEURAL SPACE W/IMG GUIDE  11/25/2019  . PELVIC LYMPH NODE DISSECTION     prostate remains intact    Medications: Prior to Admission  medications   Medication Sig Start Date End Date Taking? Authorizing Provider  albuterol (VENTOLIN HFA) 108 (90 Base) MCG/ACT inhaler Inhale 1-2 puffs into the lungs every 6 (six) hours as needed for wheezing or shortness of breath. 01/23/20  Yes Olalere, Adewale A, MD  denosumab (XGEVA) 120 MG/1.7ML SOLN Inject 120 mg into the skin every 30 (thirty) days.   Yes [provider]  dextromethorphan-guaiFENesin (MUCINEX DM) 30-600 MG 12hr tablet Take 1 tablet by mouth 2 (two) times daily as needed for cough.   Yes [provider]  diphenoxylate-atropine (LOMOTIL) 2.5-0.025 MG tablet Take 1 tablet by  mouth 4 (four) times daily as needed for diarrhea or loose stools. 12/01/19  Yes [name], MD  gabapentin (NEURONTIN) 300 MG capsule Take 1 capsule (300 mg total) by mouth at bedtime. 08/17/19  Yes [name], MD  hydrochlorothiazide (HYDRODIURIL) 25 MG tablet Take 1 tablet (25 mg total) by mouth daily. 08/22/19  Yes Guadalupe Dawn, MD  leuprolide (LUPRON) 30 MG injection Inject 30 mg into the muscle every 4 (four) months.   Yes [provider]  mirtazapine (REMERON) 15 MG tablet Take 1 tablet (15 mg total) by mouth at bedtime. 02/10/20  Yes Autry-Lott, Naaman Plummer, DO  Nutritional Supplements (ENSURE ACTIVE HIGH PROTEIN) LIQD Use 3 cans daily. 01/31/20  Yes [name], [name], MD  OVER THE COUNTER MEDICATION Take 5 mLs by mouth daily as needed (cough). Cheritussin cough syrup   Yes [provider]  prochlorperazine (COMPAZINE) 10 MG tablet TAKE 1 TABLET BY MOUTH EVERY 6 HOURS AS NEEDED FOR NAUSEA FOR VOMITING Patient taking differently: Take 10 mg by mouth every 6 (six) hours as needed for nausea or vomiting.  06/14/19  Yes [name], [name], MD  Opium 10 MG/ML (1%) TINC Take 0.6 mLs (6 mg total) by mouth every 4 (four) hours as needed for diarrhea or loose stools. Patient not taking: Reported on 03/07/2020 12/05/19   [name], MD    Allergies:   Allergies  Allergen Reactions  . Bee Venom Anaphylaxis, Shortness Of Breath and Swelling    Tongue swelling.   . Wasp Venom Anaphylaxis, Shortness Of Breath and Swelling    Tongue swelling   . Percocet [Oxycodone-Acetaminophen] Palpitations    Social History:  reports that he quit smoking about 46 years ago. He started smoking about 52 years ago. He has a 1.50 pack-year smoking history. He has never used smokeless tobacco. He reports that he does not drink alcohol and does not use drugs.  Family History: Family History  Problem Relation Age of Onset  . Heart attack Mother 58       died of heart attack  . Cervical cancer  Sister 15  . Stroke Brother 75     Objective   Physical Exam: Blood pressure 131/82, pulse 78, temperature 98.7 F (37.1 C), temperature source Oral, resp. rate (!) 21, height 5\' 5"  (1.651 m), weight 63 kg, SpO2 100 %.  Physical Exam Vitals and nursing note reviewed.  Constitutional:      Appearance: Normal appearance.  HENT:     Head: Normocephalic and atraumatic.  Eyes:     Conjunctiva/sclera: Conjunctivae normal.  Cardiovascular:     Rate and Rhythm: Normal rate and regular rhythm.  Pulmonary:     Comments: Mild conversational dyspnea Inspiratory wheezes diffusely Abdominal:     General: Abdomen is flat.     Palpations: Abdomen is soft.  Musculoskeletal:  General: No swelling or tenderness.     Right lower leg: No edema.     Left lower leg: No edema.  Skin:    Coloration: Skin is not jaundiced or pale.  Neurological:     Mental Status: He is alert. Mental status is at baseline.  Psychiatric:        Mood and Affect: Mood normal.        Behavior: Behavior normal.     LABS on Admission: I have personally reviewed all the labs and imaging below    Basic Metabolic Panel: Recent Labs  Lab 03/06/20 1504 03/07/20 1210  NA 139 143  K 4.8 4.6  CL 99 98  CO2 37* 36*  GLUCOSE 82 97  BUN 16 15  CREATININE 0.67 0.68  CALCIUM 8.9 8.9   Liver Function Tests: Recent Labs  Lab 03/07/20 1210  AST 44*  ALT 42  ALKPHOS 106  BILITOT 0.3  PROT 7.2  ALBUMIN 3.4*   No results for input(s): LIPASE, AMYLASE in the last 168 hours. No results for input(s): AMMONIA in the last 168 hours. CBC: Recent Labs  Lab 03/07/20 1210  WBC 11.2*  NEUTROABS 9.4*  HGB 11.0*  HCT 37.7*  MCV 88.3  PLT 302   Cardiac Enzymes: No results for input(s): CKTOTAL, CKMB, CKMBINDEX, TROPONINI in the last 168 hours. BNP: Invalid input(s): POCBNP CBG: No results for input(s): GLUCAP in the last 168 hours.  Radiological Exams on Admission:  CT Angio Chest W/Cm &/Or Wo  Cm  Result Date: 03/06/2020 CLINICAL DATA:  Shortness of breath.  Prostate carcinoma EXAM: CT ANGIOGRAPHY CHEST WITH CONTRAST TECHNIQUE: Multidetector CT imaging of the chest was performed using the standard protocol during bolus administration of intravenous contrast. Multiplanar CT image reconstructions and MIPs were obtained to evaluate the vascular anatomy. CONTRAST:  174mL OMNIPAQUE IOHEXOL 350 MG/ML SOLN COMPARISON:  Chest radiograph February 28, 2020; chest CT Nov 11, 2019 FINDINGS: Cardiovascular: There is no demonstrable pulmonary embolus. There is no thoracic aortic aneurysm or dissection. Visualized great vessels appear unremarkable. Note that the right innominate and left common carotid arteries arise as a common trunk, an anatomic variant. There is aortic atherosclerosis. No pericardial effusion or pericardial thickening. There are occasional foci of coronary artery calcification. There is prominence of the main pulmonary outflow tract measuring 3.4 cm. Port-A-Cath tip in superior vena cava. Mediastinum/Nodes: Thyroid appears unremarkable. No appreciable thoracic adenopathy. No esophageal lesions. Lungs/Pleura: There are pleural effusions bilaterally, larger on the right than on the left. There is airspace consolidation in each lower lobe consistent with a combination of compressive atelectasis and apparent pneumonia. Areas of scattered airspace consolidation also noted in portions of the right middle and right upper lobes. There is a degree of underlying interstitial thickening. Upper Abdomen: Visualized upper abdominal structures appear unremarkable. Musculoskeletal: Widespread blastic bony metastases are noted throughout the thoracic region. There is extensive gynecomastia bilaterally. There is a port along the anterior right hemithorax. Review of the MIP images confirms the above findings. IMPRESSION: 1. No appreciable pulmonary embolus. No thoracic aortic aneurysm or dissection. There is aortic  atherosclerosis. There are occasional foci of coronary artery calcification. 2. Sizable pleural effusions bilaterally with multifocal pneumonia, more on the right than on the left. 3.  Apparent lymphangitic spread of tumor, also present previously. 4.  Extensive bony metastatic disease diffusely. 5. Pulmonary arterial hypertension, characterized by enlargement of the main pulmonary outflow tract. 6.  No adenopathy appreciable. 7.  Gynecomastia again noted. Aortic Atherosclerosis (  ICD10-I70.0). Electronically Signed   By: Lowella Grip III M.D.   On: 03/06/2020 17:11   DG Chest Port 1 View  Result Date: 03/07/2020 CLINICAL DATA:  Possible sepsis. EXAM: PORTABLE CHEST 1 VIEW COMPARISON:  02/28/2020 FINDINGS: Power port on the right has its tip in the SVC just above the right atrium. Widespread bilateral pulmonary infiltrates persist. Bilateral pleural effusions, larger on the right than the left. Allowing for technical factors, the findings appear similar to the study of 1 week ago. Sclerotic skeletal changes consistent with known metastatic disease. IMPRESSION: Similar appearance to the study of 8 days ago. Widespread bilateral pulmonary infiltrates and bilateral pleural effusions, larger on the right than the left. Electronically Signed   By: Nelson Chimes M.D.   On: 03/07/2020 12:36      EKG: Independently reviewed.    A & P   Principal Problem:   Acute on chronic respiratory failure with hypoxia (HCC) Active Problems:   Essential hypertension   Prostate cancer metastatic to lung (HCC)   Pleural effusion   1. Acute on chronic hypoxic respiratory failure  DOE a. Likely secondary to pleural effusions which could be malignant in etiology from his metastatic cancer b. Chest imaging concerning for multifocal pneumonia and pleural effusions though he is afebrile with WBC 11.2, and completed Augmentin x7 days and received vancomycin and cefepime in the ED c. BNP unremarkable d. Hold on  further antibiotics for now pending PCCM recommendations for thoracentesis VS further antibiotics e. Continue antitussives and as needed albuterol f. PT eval  2. Metastatic prostate cancer to bone and lung a. Follows with Dr. Alen Blew  b. outpatient follow-up  3. Hypertension a. Continue home meds     DVT prophylaxis: Lovenox   Code Status: DNR   Family Communication: Admission, patients condition and plan of care including tests being ordered have been discussed with the patient who indicates understanding and agrees with the plan and Code Status. Patient's daughter was updated  Disposition Plan: The appropriate patient status for this patient is INPATIENT. Inpatient status is judged to be reasonable and necessary in order to provide the required intensity of service to ensure the patient's safety. The patient's presenting symptoms, physical exam findings, and initial radiographic and laboratory data in the context of their chronic comorbidities is felt to place them at high risk for further clinical deterioration. Furthermore, it is not anticipated that the patient will be medically stable for discharge from the hospital within 2 midnights of admission. The following factors support the patient status of inpatient.   " The patient's presenting symptoms include dyspnea on exertion. " The worrisome physical exam findings include conversational dyspnea. " The initial radiographic and laboratory data are worrisome because of multifocal pneumonia, pleural effusions. " The chronic co-morbidities include metastatic prostate cancer.   * I certify that at the point of admission it is my clinical judgment that the patient will require inpatient hospital care spanning beyond 2 midnights from the point of admission due to high intensity of service, high risk for further deterioration and high frequency of surveillance required.*    Consultants  . PCCM  Procedures  . None  Time Spent on  Admission: 65 minutes    Harold Hedge, DO [facility] Pager [phone] 03/07/2020, 4:54 PM

## 2020-03-07 NOTE — Telephone Encounter (Signed)
Rexene Edison NP made aware of patient's status. She had advised this plan. Nothing further needed.

## 2020-03-07 NOTE — ED Triage Notes (Signed)
Pt came in from home. Hx of prostate cancer and Lung. DX with double PNA at [place] x2 days ago. Productive Cough.

## 2020-03-08 ENCOUNTER — Other Ambulatory Visit: Payer: Self-pay

## 2020-03-08 ENCOUNTER — Telehealth: Payer: Self-pay | Admitting: Oncology

## 2020-03-08 ENCOUNTER — Inpatient Hospital Stay (HOSPITAL_COMMUNITY): Payer: Medicare Other

## 2020-03-08 DIAGNOSIS — J9621 Acute and chronic respiratory failure with hypoxia: Secondary | ICD-10-CM

## 2020-03-08 DIAGNOSIS — J90 Pleural effusion, not elsewhere classified: Principal | ICD-10-CM

## 2020-03-08 DIAGNOSIS — J189 Pneumonia, unspecified organism: Secondary | ICD-10-CM

## 2020-03-08 LAB — BODY FLUID CELL COUNT WITH DIFFERENTIAL
Lymphs, Fluid: 75 %
Monocyte-Macrophage-Serous Fluid: 9 % — ABNORMAL LOW (ref 50–90)
Neutrophil Count, Fluid: 16 % (ref 0–25)
Total Nucleated Cell Count, Fluid: 297 cu mm (ref 0–1000)

## 2020-03-08 LAB — EXPECTORATED SPUTUM ASSESSMENT W GRAM STAIN, RFLX TO RESP C

## 2020-03-08 LAB — BASIC METABOLIC PANEL
Anion gap: 10 (ref 5–15)
BUN: 14 mg/dL (ref 8–23)
CO2: 31 mmol/L (ref 22–32)
Calcium: 8.4 mg/dL — ABNORMAL LOW (ref 8.9–10.3)
Chloride: 97 mmol/L — ABNORMAL LOW (ref 98–111)
Creatinine, Ser: 0.73 mg/dL (ref 0.61–1.24)
GFR calc Af Amer: >60 mL/min (ref >60)
GFR calc non Af Amer: >60 mL/min (ref >60)
Glucose, Bld: 97 mg/dL (ref 70–99)
Potassium: 4.3 mmol/L (ref 3.5–5.1)
Sodium: 138 mmol/L (ref 135–145)

## 2020-03-08 LAB — PROTEIN, TOTAL: Total Protein: 6.7 g/dL (ref 6.5–8.1)

## 2020-03-08 LAB — URINE CULTURE: Culture: NO GROWTH

## 2020-03-08 LAB — PROTEIN, PLEURAL OR PERITONEAL FLUID: Total protein, fluid: 3.7 g/dL

## 2020-03-08 LAB — LACTATE DEHYDROGENASE, PLEURAL OR PERITONEAL FLUID: LD, Fluid: 241 U/L — ABNORMAL HIGH (ref 3–23)

## 2020-03-08 LAB — LACTATE DEHYDROGENASE: LDH: 212 U/L — ABNORMAL HIGH (ref 98–192)

## 2020-03-08 MED ORDER — ENSURE ENLIVE PO LIQD
237.0000 mL | Freq: Three times a day (TID) | ORAL | Status: DC
  Administered 2020-03-08 – 2020-03-09 (×3): 237 mL via ORAL

## 2020-03-08 MED ORDER — CHLORHEXIDINE GLUCONATE CLOTH 2 % EX PADS
6.0000 | MEDICATED_PAD | Freq: Every day | CUTANEOUS | Status: DC
  Administered 2020-03-08 – 2020-03-09 (×2): 6 via TOPICAL

## 2020-03-08 MED ORDER — ADULT MULTIVITAMIN W/MINERALS CH
1.0000 | ORAL_TABLET | Freq: Every day | ORAL | Status: DC
  Administered 2020-03-08 – 2020-03-09 (×2): 1 via ORAL
  Filled 2020-03-08 (×2): qty 1

## 2020-03-08 NOTE — Significant Event (Signed)
Rapid Response Event Note   Reason for Call :  Monitor Patient during throracentesis      Interventions:  Consent signed and timeout preformed to verify correct patient, correct procedure, and correct site. Pt monitored by ACLS RN during procedure including, HR rhythm, O2 sats, and BP. Vital signs remained stable during procedure and post procedure.   Plan of Care:  CXR ordered. Pt assisted back in bed. Bedside RN instructed for further monitoring. RN to call rapid response with any concerns. Pt states feeling improved prior to rapid response leaving bedside.     Wray Kearns, RN

## 2020-03-08 NOTE — Progress Notes (Addendum)
VAST consulted to access implanted port.  Pt currently has 2 PIV's in place; per pt report one placed by EMT and one placed by ER staff. Pt stated ER staff told him they could not access his port since he was receiving chemo through it. VAST RN advised patient his right of refusing IV sticks when his port can be accessed. Educated pt once physician orders are placed for daily labs, we will access his port and remove PIV's not needed. This will decrease his risk for infection d/t multiple needle sticks.  Pt stated he requested this initially and verbalized understanding, as well as thanked staff for looking out for his best interest. Unit RN notified.

## 2020-03-08 NOTE — Evaluation (Signed)
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  Pt admitted for Acute on chronic hypoxic respiratory failure likely secondary to pleural effusions which could be malignant in etiology from his metastatic cancer  Clinical Impression  Pt admitted with above diagnosis.  Pt currently with functional limitations due to the deficits listed below (see PT Problem List). Pt will benefit from skilled PT to increase their independence and safety with mobility to allow discharge to the venue listed below.  Pt limited by dyspnea with exertion however only min/guard for mobility.  Pt reports he lives with daughter who works from home and family is constantly stopping by to check on him.  Pt avoiding use of DME however would benefit from 4 wheeled walker with seat due to limited endurance if he is agreeable.     Follow Up Recommendations No PT follow up    Equipment Recommendations  Other (comment) (would benefit from 4 WW with seat if pt agreeable)    Recommendations for Other Services       Precautions / Restrictions Precautions Precautions: Fall Precaution Comments: 2L O2 baseline      Mobility  Bed Mobility Overal bed mobility: Modified Independent                Transfers Overall transfer level: Needs assistance Equipment used: None Transfers: Sit to/from Stand Sit to Stand: Min guard            Ambulation/Gait Ambulation/Gait assistance: Min guard Gait Distance (Feet): 120 Feet Assistive device:  None Gait Pattern/deviations: Step-through pattern;Decreased stride length     General Gait Details: required 4 standing rest breaks due to dyspnea, on 2L O2NC and SPO2 93-96%, pt with 3/4 dyspnea  Stairs            Wheelchair Mobility    Modified Rankin (Stroke Patients Only)       Balance Overall balance assessment: No apparent balance deficits (not formally assessed)                                           Pertinent Vitals/Pain Pain Assessment: No/denies pain    Home Living Family/patient expects to be discharged to:: Private residence Living Arrangements: Children (daughter)     Home Access: Stairs to enter   Technical brewer of Steps: 3-4 Home Layout: One level Home Equipment: None      Prior Function Level of Independence: Independent               Hand Dominance        Extremity/Trunk Assessment        Lower Extremity Assessment Lower Extremity Assessment: Generalized weakness    Cervical / Trunk Assessment Cervical / Trunk Assessment: Normal  Communication   Communication: No difficulties  Cognition Arousal/Alertness: Awake/alert Behavior During Therapy: WFL for tasks assessed/performed Overall Cognitive Status: Within Functional Limits for tasks assessed  General Comments      Exercises     Assessment/Plan    PT Assessment Patient needs continued PT services  PT Problem List Decreased activity tolerance;Decreased mobility;Cardiopulmonary status limiting activity       PT Treatment Interventions DME instruction;Therapeutic exercise;Gait training;Functional mobility training;Therapeutic activities;Patient/family education    PT Goals (Current goals can be found in the Care Plan section)  Acute Rehab PT Goals PT Goal Formulation: With patient Time For Goal Achievement: 03/22/20 Potential to Achieve Goals: Good    Frequency Min 3X/week    Barriers to discharge        Co-evaluation               AM-PAC PT "6 Clicks" Mobility  Outcome Measure Help needed turning from your back to your side while in a flat bed without using bedrails?: None Help needed moving from lying on your back to sitting on the side of a flat bed without using bedrails?: None Help needed moving to and from a bed to a chair (including a wheelchair)?: A Little Help needed standing up from a chair using your arms (e.g., wheelchair or bedside chair)?: A Little Help needed to walk in hospital room?: A Little Help needed climbing 3-5 steps with a railing? : A Little 6 Click Score: 20    End of Session Equipment Utilized During Treatment: Oxygen Activity Tolerance: Patient tolerated treatment well Patient left: in chair;with call bell/phone within reach Nurse Communication: Mobility status PT Visit Diagnosis: Difficulty in walking, not elsewhere classified (R26.2)    Time: 2130-8657 PT Time Calculation (min) (ACUTE ONLY): 14 min   Charges:   PT Evaluation $PT Eval Low Complexity: 1 Low        Kati PT, DPT Acute Rehabilitation Services Pager: [phone] Office: [phone]  [name] 03/08/2020, 12:11 PM

## 2020-03-08 NOTE — Procedures (Signed)
Thoracentesis  Procedure Note  DONAVAN KERLIN  [id number]  01/31/1946  Date:03/08/20  Time:4:18 PM   Provider Performing:Lasharon Dunivan V. Tienna Bienkowski   Procedure: Thoracentesis with imaging guidance (90228)  Indication(s) Pleural Effusion  Consent Risks of the procedure as well as the alternatives and risks of each were explained to the patient and/or caregiver.  Consent for the procedure was obtained and is signed in the bedside chart  Anesthesia Topical only with 1% lidocaine    Time Out Verified patient identification, verified procedure, site/side was marked, verified correct patient position, special equipment/implants available, medications/allergies/relevant history reviewed, required imaging and test results available.   Sterile Technique Maximal sterile technique including full sterile barrier drape, hand hygiene, sterile gown, sterile gloves, mask, hair covering, sterile ultrasound probe cover (if used).  Procedure Description Ultrasound was used to identify appropriate pleural anatomy for placement and overlying skin marked.  Area of drainage cleaned and draped in sterile fashion. Lidocaine was used to anesthetize the skin and subcutaneous tissue.  1100 cc's of clear yellow appearing fluid was drained from the right pleural space. Catheter then removed and bandaid applied to site. [place] picture saved on machine  Complications/Tolerance None; patient tolerated the procedure well. Chest X-ray is ordered to confirm no post-procedural complication.   EBL Minimal   Specimen(s) Pleural fluid  Ryah Cribb V. Elsworth Soho MD

## 2020-03-08 NOTE — Consult Note (Signed)
NAME:  [name] Mathews, MRN:  [id number], DOB:  [birth year], LOS: 1 ADMISSION DATE:  03/07/2020, CONSULTATION DATE:  03/08/2020 REFERRING MD:  Dr. 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  History of present illness   74 y/o M presented to the [facility] ED with complaints of increased SOB at home over the last week. He was seen outpatient for complaints of productive cough and SOB, with CXR suspicious for PNA and was started on a 7 day course of Augmentin- which he completed.  He is independent at baseline on 2 L O2 at home around the clock.  Over the last week he reports being unable to walk 6 feet without needing to sit and rest.  He denies needing to increase his home 02, but admits noticing his SpO2 sats dropping into 80's with activity.  He admits to coughing up white thick sputum, but states overall he feels better except when trying to move around/walk.  Pt denies fevers/nausea/vomiting over the last week.  He normally experiences some loose stools due to chemotherapy treatment, and denies anything unusual.  Pt received a previous thoracentesis, draining 900 mL of an exudative pleural effusion on 11/25/2019, ([id number]).     Past Medical History  HTN HLD Prostate cancer (2001) w/ mets to the bone (2013) and lymphangitic spread Recurring pleural effusions  Significant Hospital Events     Consults:    Procedures:  9/16 Thoracentesis >>  Significant Diagnostic Tests:  CTa 9/14 >> no PE, large pleural effusions R > L & multifocal PNA, previously seen apparent lymphangitic spread of tumor, pulmonary arterial hypertension, extensive bony metastatic disease CXR 9/15 >> Bilateral pleural effusions R > L and bilateral infiltrates  Micro Data:  COVID 9/15 >> negative UA 9/15 >> negative UC 9/15 >>  BC  9/15 >> RC 9/16 >> Antimicrobials:  Augmentin 9/7 >> 7 day outpatient course completed 9/14 Vancomycin 9/15 >> ED x 1 Cefepime 9/15 >> ED x 1  Interim history/subjective:  Pt sitting up in chair, NAD on home O2 of 2 L [name].  Pt denies pain/discomfort, wants to breath more easily so he can be independent again.  Discussed Thoracentesis.  Objective   Blood pressure 131/85, pulse 89, temperature 98.5 F (36.9 C), temperature source Oral, resp. rate 19, height 5' 5.5" (1.664 m), weight 61.4 kg, SpO2 98 %.        Intake/Output Summary (Last 24 hours) at 03/08/2020 1056 Last data filed at 03/08/2020 1003 Gross per 24 hour  Intake --  Output 500 ml  Net -500 ml   Filed Weights   03/07/20 1209 03/07/20 2204  Weight: 63 kg 61.4 kg    Examination: General: Alert, pleasant male, NAD HEENT: MM pink/ moist, anicteric, PERRL Neuro: Alert & Oriented CV: s1s2, RRR, NSR on telemetry, no murmurs/rubs/gallops, +2 pulses  Pulm: unlabored on 2 L [place], breath sounds- BUL: diminished & RML/BLL: crackles  GI: soft, non tender, bowel sounds active Skin: warm/dry/ intact, no rashes/lesions Extremities: trace BLL edema, warm/dry  Resolved Hospital Problem list     Assessment & Plan:   Acute on Chronic Hypoxic Respiratory Failure in the setting of bilateral pleural effusions (R>L) & lymphangitic spread - wean O2 for sats 88-94% - intermittent CXR - plan for thoracentesis 9/16 for symptomatic relief of dyspnea & repeat cytology  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 Alen Mathews - Next chemotherapy dose on 9/21  Obstructive Sleep Apnea - pt prescribed CPAP outpatient s/p sleep study 5/21  - he has not been wearing it due to a reported calibration issue - will need to coordinate with care management to see if someone can adjust machine for him upon  discharge - continuous cardiac & SpO2 monitoring  Hypertension - home meds continued per primary team  Best practice:  Diet: Regular Pain/Anxiety/Delirium protocol (if indicated): N/A VAP protocol (if indicated): N/A DVT prophylaxis: enoxaparin GI prophylaxis: N/A Glucose control: per primary Mobility: OOB with assistance as tolerated Code Status: DNR Family Communication: pt updated on plan of care Disposition: per primary  Labs   CBC: Recent Labs  Lab 03/07/20 1210  WBC 11.2*  NEUTROABS 9.4*  HGB 11.0*  HCT 37.7*  MCV 88.3  PLT 509    Basic Metabolic Panel: Recent Labs  Lab 03/06/20 1504 03/07/20 1210 03/08/20 0521  NA 139 143 138  K 4.8 4.6 4.3  CL 99 98 97*  CO2 37* 36* 31  GLUCOSE 82 97 97  BUN 16 15 14   CREATININE 0.67 0.68 0.73  CALCIUM 8.9 8.9 8.4*   GFR: Estimated Creatinine Clearance: 70.4 mL/min (by C-G formula based on SCr of 0.73 mg/dL). Recent Labs  Lab 03/07/20 1210  WBC 11.2*  LATICACIDVEN 1.0    Liver Function Tests: Recent Labs  Lab 03/07/20 1210  AST 44*  ALT 42  ALKPHOS 106  BILITOT 0.3  PROT 7.2  ALBUMIN 3.4*   No results for input(s): LIPASE, AMYLASE in the last 168 hours. No results for input(s): AMMONIA in the last 168 hours.  ABG    Component Value Date/Time   TCO2 31 11/27/2016 1302     Coagulation Profile: Recent Labs  Lab 03/07/20 1210  INR 1.0    Cardiac Enzymes: No results for input(s): CKTOTAL, CKMB, CKMBINDEX, TROPONINI in the last 168 hours.  HbA1C: No results found for: HGBA1C  CBG: No results for input(s): GLUCAP in the last 168 hours.  Review of Systems: positives in bold  Gen: Denies fever, chills, weight change, fatigue, night sweats HEENT: Denies blurred vision, double vision, hearing loss, tinnitus, sinus congestion, rhinorrhea, sore throat, neck stiffness, dysphagia PULM: Denies shortness of breath, cough, sputum production, hemoptysis, intermittent wheezing CV: Denies chest pain,  edema, orthopnea, paroxysmal nocturnal dyspnea, palpitations GI: Denies abdominal pain, nausea, vomiting, diarrhea, hematochezia, melena, constipation, change in bowel habits GU: Denies dysuria, hematuria, polyuria, oliguria, urethral discharge Endocrine: Denies hot or cold intolerance, polyuria, polyphagia or appetite change Derm: Denies rash, dry skin, scaling or peeling skin change Heme: Denies easy bruising, bleeding, bleeding gums Neuro: Denies headache, numbness, weakness, slurred speech, loss of memory or consciousness  Past Medical History  He,  has a past medical history of Hyperlipidemia, Hypertension, Prostate cancer ([employer]), and Restless leg syndrome.   Surgical History    Past Surgical History:  Procedure Laterality Date  . IR CV LINE INJECTION  02/15/2019  . IR IMAGING GUIDED PORT INSERTION  01/11/2019  . IR THORACENTESIS ASP PLEURAL SPACE W/IMG GUIDE  11/25/2019  . PELVIC LYMPH NODE DISSECTION     prostate remains intact     Social History   reports that he quit smoking about 46 years ago. He started smoking about 52 years ago. He has a 1.50 pack-year smoking  history. He has never used smokeless tobacco. He reports that he does not drink alcohol and does not use drugs.   Family History   His family history includes Cervical cancer (age of onset: 36) in his sister; Heart attack (age of onset: 66) in his mother; Stroke (age of onset: 70) in his brother.   Allergies Allergies  Allergen Reactions  . Bee Venom Anaphylaxis, Shortness Of Breath and Swelling    Tongue swelling.   . Wasp Venom Anaphylaxis, Shortness Of Breath and Swelling    Tongue swelling   . Percocet [Oxycodone-Acetaminophen] Palpitations     Home Medications  Prior to Admission medications   Medication Sig Start Date End Date Taking? Authorizing Provider  albuterol (VENTOLIN HFA) 108 (90 Base) MCG/ACT inhaler Inhale 1-2 puffs into the lungs every 6 (six) hours as needed for wheezing or shortness of  breath. 01/23/20  Yes Olalere, Adewale A, MD  denosumab (XGEVA) 120 MG/1.7ML SOLN Inject 120 mg into the skin every 30 (thirty) days.   Yes [provider]  dextromethorphan-guaiFENesin (MUCINEX DM) 30-600 MG 12hr tablet Take 1 tablet by mouth 2 (two) times daily as needed for cough.   Yes [provider]  diphenoxylate-atropine (LOMOTIL) 2.5-0.025 MG tablet Take 1 tablet by mouth 4 (four) times daily as needed for diarrhea or loose stools. 12/01/19  Yes [name], MD  gabapentin (NEURONTIN) 300 MG capsule Take 1 capsule (300 mg total) by mouth at bedtime. 08/17/19  Yes [name], MD  hydrochlorothiazide (HYDRODIURIL) 25 MG tablet Take 1 tablet (25 mg total) by mouth daily. 08/22/19  Yes Guadalupe Dawn, MD  leuprolide (LUPRON) 30 MG injection Inject 30 mg into the muscle every 4 (four) months.   Yes [provider]  mirtazapine (REMERON) 15 MG tablet Take 1 tablet (15 mg total) by mouth at bedtime. 02/10/20  Yes Autry-Lott, Naaman Plummer, DO  Nutritional Supplements (ENSURE ACTIVE HIGH PROTEIN) LIQD Use 3 cans daily. 01/31/20  Yes [name], [name], MD  OVER THE COUNTER MEDICATION Take 5 mLs by mouth daily as needed (cough). Cheritussin cough syrup   Yes [provider]  prochlorperazine (COMPAZINE) 10 MG tablet TAKE 1 TABLET BY MOUTH EVERY 6 HOURS AS NEEDED FOR NAUSEA FOR VOMITING Patient taking differently: Take 10 mg by mouth every 6 (six) hours as needed for nausea or vomiting.  06/14/19  Yes [name], [name], MD  Opium 10 MG/ML (1%) TINC Take 0.6 mLs (6 mg total) by mouth every 4 (four) hours as needed for diarrhea or loose stools. Patient not taking: Reported on 03/07/2020 12/05/19   [name], MD     Critical care time: N/A      Domingo Pulse Rust-[place]ster, AGACNP-BC [facility]    Please see Amion for pager details.

## 2020-03-08 NOTE — Telephone Encounter (Signed)
Scheduled per 08/31 los, patient has been called and notified.  

## 2020-03-08 NOTE — Progress Notes (Signed)
PROGRESS NOTE  [name] Mathews  DXA:[id number] DOB: 09/21/1945 DOA: 03/07/2020 PCP: Shary Key, DO  Outpatient Specialists: Dr. Alen Blew, oncology Brief Narrative: [name] Mathews is a 74 y.o. male with a history of metastatic prostate CA to bone and lymphangitic spread, chronic hypoxic respiratory failure on 2L O2, HTN, HLD, and pleural effusion s/p thoracentesis June 2021, exudative who presented to the ED 9/15 with worsening shortness of breath. He follows closely with [facility] and had completed a week of augmentin for possible pneumonia, had CTA that showed no PE but did confirm sizeable bilateral (R > L) pleural effusions, negative LE venous U/S for DVT. In the ED he appeared short of breath, desaturating on exertion with redemonstration of pleural effusions on CXR. WBC 11.2k, BNP 87. Antibiotics started, pulmonary consulted and performed thoracentesis 9/16, cytology pending.   Assessment & Plan: Principal Problem:   Acute on chronic respiratory failure with hypoxia (HCC) Active Problems:   Essential hypertension   Prostate cancer metastatic to lung [facility])   Multifocal pneumonia   Pleural effusion  Acute on chronic hypoxic respiratory failure: Primary driver is likely pleural effusion reaccumulation +/- lymphangitic spread of malignancy. BNP unremarkable, did not respond to augmentin course, so abx not continued here. Covid negative.  - Sputum culture requested - s/p right thoracentesis by PCCM, Dr. Elsworth Soho. Studies pending. If +malignancy, would have lower threshold to place pleurx, though accumulation took 3 months.   Metastatic prostate CA to bone, lung: Dx 2001 with mets noted 2013, undergoing salvage chemotherapy. - Follow up with Dr. Alen Blew per routine, due for next chemotherapy 9/21. Also due to denosumab.   HTN:  - Continue home medications.   OSA:  - Not on CPAP.   DVT prophylaxis: Lovenox Code Status: DNR Family Communication: None at bedside Disposition Plan:    Status is: Inpatient  Remains inpatient appropriate because:Ongoing diagnostic testing needed not appropriate for outpatient work up  Dispo: The patient is from: Home              Anticipated d/c is to: Home              Anticipated d/c date is: 1 day              Patient currently is not medically stable to d/c.  Consultants:   PCM  Procedures:   Thoracentesis (R) 03/08/2020 Dr. Elsworth Soho.   Antimicrobials:  Vancomycin, cefepime 9/15   Subjective: Shortness of breath on interview this morning was largely unchanged, worse with speaking or moving. No chest pain or other complaints.   Objective: Vitals:   03/08/20 0537 03/08/20 0800 03/08/20 1000 03/08/20 1403  BP: 131/86  131/85 138/84  Pulse: 91  89 88  Resp: 16  19 (!) 21  Temp: 98.2 F (36.8 C)  98.5 F (36.9 C) 98.2 F (36.8 C)  TempSrc:   Oral Oral  SpO2: 95% 94% 98% 96%  Weight:      Height:        Intake/Output Summary (Last 24 hours) at 03/08/2020 1714 Last data filed at 03/08/2020 1003 Gross per 24 hour  Intake --  Output 500 ml  Net -500 ml   Filed Weights   03/07/20 1209 03/07/20 2204  Weight: 63 kg 61.4 kg    Gen: 74 y.o. male in no distress Pulm: Pursed lips at times, tachypneic, no accessory muscle use, diminished globally R > L without wheezes.  CV: Regular rate and rhythm. No murmur, rub, or gallop. No JVD,  no pitting pedal edema. GI: Abdomen soft, non-tender, non-distended, with normoactive bowel sounds. No organomegaly or masses felt. Ext: Warm, no deformities Skin: No rashes, lesions or ulcers Neuro: Alert and oriented. No focal neurological deficits. Psych: Judgement and insight appear normal. Mood & affect appropriate.   Data Reviewed: I have personally reviewed following labs and imaging studies  CBC: Recent Labs  Lab 03/07/20 1210  WBC 11.2*  NEUTROABS 9.4*  HGB 11.0*  HCT 37.7*  MCV 88.3  PLT 237   Basic Metabolic Panel: Recent Labs  Lab 03/06/20 1504 03/07/20 1210  03/08/20 0521  NA 139 143 138  K 4.8 4.6 4.3  CL 99 98 97*  CO2 37* 36* 31  GLUCOSE 82 97 97  BUN 16 15 14   CREATININE 0.67 0.68 0.73  CALCIUM 8.9 8.9 8.4*   GFR: Estimated Creatinine Clearance: 70.4 mL/min (by C-G formula based on SCr of 0.73 mg/dL). Liver Function Tests: Recent Labs  Lab 03/07/20 1210  AST 44*  ALT 42  ALKPHOS 106  BILITOT 0.3  PROT 7.2  ALBUMIN 3.4*   No results for input(s): LIPASE, AMYLASE in the last 168 hours. No results for input(s): AMMONIA in the last 168 hours. Coagulation Profile: Recent Labs  Lab 03/07/20 1210  INR 1.0   Cardiac Enzymes: No results for input(s): CKTOTAL, CKMB, CKMBINDEX, TROPONINI in the last 168 hours. BNP (last 3 results) Recent Labs    03/06/20 1504  PROBNP 78.0   HbA1C: No results for input(s): HGBA1C in the last 72 hours. CBG: No results for input(s): GLUCAP in the last 168 hours. Lipid Profile: No results for input(s): CHOL, HDL, LDLCALC, TRIG, CHOLHDL, LDLDIRECT in the last 72 hours. Thyroid Function Tests: No results for input(s): TSH, T4TOTAL, FREET4, T3FREE, THYROIDAB in the last 72 hours. Anemia Panel: No results for input(s): VITAMINB12, FOLATE, FERRITIN, TIBC, IRON, RETICCTPCT in the last 72 hours. Urine analysis:    Component Value Date/Time   COLORURINE YELLOW 03/07/2020 1210   APPEARANCEUR CLEAR 03/07/2020 1210   LABSPEC 1.034 (H) 03/07/2020 1210   PHURINE 6.0 03/07/2020 1210   GLUCOSEU NEGATIVE 03/07/2020 1210   HGBUR NEGATIVE 03/07/2020 1210   BILIRUBINUR NEGATIVE 03/07/2020 1210   KETONESUR NEGATIVE 03/07/2020 1210   PROTEINUR 30 (A) 03/07/2020 1210   NITRITE NEGATIVE 03/07/2020 1210   LEUKOCYTESUR NEGATIVE 03/07/2020 1210   Recent Results (from the past 240 hour(s))  Blood culture (routine single)     Status: None (Preliminary result)   Collection Time: 03/07/20 12:10 PM   Specimen: BLOOD  Result Value Ref Range Status   Specimen Description   Final    BLOOD SITE NOT  SPECIFIED Performed at [facility], [place] [address], [place], [place] [address]    Special Requests   Final    BOTTLES DRAWN AEROBIC AND ANAEROBIC Blood Culture results may not be optimal due to an inadequate volume of blood received in culture bottles Performed at [place] [address] [place], [place] [address]    Culture   Final    NO GROWTH 1 DAY Performed at [facility], [place] [address], [place], [place] [address]    Report Status PENDING  Incomplete  SARS Coronavirus 2 by RT PCR (hospital order, performed in [facility] hospital lab) Nasopharyngeal Nasopharyngeal Swab     Status: None   Collection Time: 03/07/20 12:28 PM   Specimen: Nasopharyngeal Swab  Result Value Ref Range Status   SARS Coronavirus 2 NEGATIVE NEGATIVE Final  Comment: (NOTE) SARS-CoV-2 target nucleic acids are NOT DETECTED.  The SARS-CoV-2 RNA is generally detectable in upper and lower respiratory specimens during the acute phase of infection. The lowest concentration of SARS-CoV-2 viral copies this assay can detect is 250 copies / mL. A negative result does not preclude SARS-CoV-2 infection and should not be used as the sole basis for treatment or other patient management decisions.  A negative result may occur with improper specimen collection / handling, submission of specimen other than nasopharyngeal swab, presence of viral mutation(s) within the areas targeted by this assay, and inadequate number of viral copies (<250 copies / mL). A negative result must be combined with clinical observations, patient history, and epidemiological information.  Fact Sheet for Patients:   StrictlyIdeas.no  Fact Sheet for Healthcare Providers: BankingDealers.co.za  This test is not yet approved or  cleared by the [place] FDA and has been authorized for detection and/or diagnosis of SARS-CoV-2 by FDA under an Emergency Use  Authorization (EUA).  This EUA will remain in effect (meaning this test can be used) for the duration of the COVID-19 declaration under Section 564(b)(1) of the Act, 21 U.S.C. section 360bbb-3(b)(1), unless the authorization is terminated or revoked sooner.  Performed at [facility], [place] [address] [place], [place] [address]   Expectorated sputum assessment w rflx to resp cult     Status: None   Collection Time: 03/08/20  1:13 PM   Specimen: Sputum  Result Value Ref Range Status   Specimen Description SPU  Final   Special Requests NONE  Final   Sputum evaluation   Final    THIS SPECIMEN IS ACCEPTABLE FOR SPUTUM CULTURE Performed at [facility], [place] [address], [place], [place] [address]    Report Status 03/08/2020 FINAL  Final      Radiology Studies: DG Chest Port 1 View  Result Date: 03/07/2020 CLINICAL DATA:  Possible sepsis. EXAM: PORTABLE CHEST 1 VIEW COMPARISON:  02/28/2020 FINDINGS: Power port on the right has its tip in the SVC just above the right atrium. Widespread bilateral pulmonary infiltrates persist. Bilateral pleural effusions, larger on the right than the left. Allowing for technical factors, the findings appear similar to the study of 1 week ago. Sclerotic skeletal changes consistent with known metastatic disease. IMPRESSION: Similar appearance to the study of 8 days ago. Widespread bilateral pulmonary infiltrates and bilateral pleural effusions, larger on the right than the left. Electronically Signed   By: Nelson Chimes M.D.   On: 03/07/2020 12:36    Scheduled Meds: . enoxaparin (LOVENOX) injection  40 mg Subcutaneous Q24H  . feeding supplement (ENSURE ENLIVE)  237 mL Oral TID BM  . gabapentin  300 mg Oral QHS  . hydrochlorothiazide  25 mg Oral Daily  . mirtazapine  15 mg Oral QHS  . multivitamin with minerals  1 tablet Oral Daily   Continuous Infusions:   LOS: 1 day   Time spent: 25 minutes.  Patrecia Pour, MD [facility] [email or web address] 03/08/2020, 5:14 PM

## 2020-03-08 NOTE — Progress Notes (Signed)
Initial Nutrition Assessment  INTERVENTION:   -Ensure Enlive po TID, each supplement provides 350 kcal and 20 grams of protein -Multivitamin with minerals daily  NUTRITION DIAGNOSIS:   Increased nutrient needs related to cancer and cancer related treatments as evidenced by estimated needs.  GOAL:   Patient will meet greater than or equal to 90% of their needs  MONITOR:   PO intake, Supplement acceptance, Labs, Weight trends, I & O's  REASON FOR ASSESSMENT:   Malnutrition Screening Tool    ASSESSMENT:   74 year old male who has been vaccinated against COVID-19 with a past medical history of metastatic prostate cancer to bone and with lymphangitic spread, former smoker, bilateral pleural effusions and chronic respiratory failure on 2 L nasal cannula, hypertension, hyperlipidemia, restless leg syndrome who presented to the ED with worsening shortness of breath on exertion x3 weeks.  Patient unavailable at time of visit, provider in room. Per chart review pt currently undergoing treatment for metastatic prostate cancer (last 9/2). Pt reporting decreased appetite.  No PO documented. Will order Ensure supplements given increased needs from metastatic disease.  Per weight records, pt has lost 27 lbs since 2/24 (16% wt loss x 7 months, significant for time frame).  Labs reviewed. Medications: Remeron  NUTRITION - FOCUSED PHYSICAL EXAM:  Unable to complete  Diet Order:   Diet Order            Diet regular Room service appropriate? Yes; Fluid consistency: Thin  Diet effective now                 EDUCATION NEEDS:   Not appropriate for education at this time  Skin:  Skin Assessment: Reviewed RN Assessment  Last BM:  9/16  Height:   Ht Readings from Last 1 Encounters:  03/07/20 5' 5.5" (1.664 m)    Weight:   Wt Readings from Last 1 Encounters:  03/07/20 61.4 kg    BMI:  Body mass index is 22.19 kg/m.  Estimated Nutritional Needs:   Kcal:   1850-2050  Protein:  90-100g  Fluid:  2L/day  Clayton Bibles, MS, RD, LDN Inpatient Clinical Dietitian Contact information available via Amion

## 2020-03-09 MED ORDER — HEPARIN SOD (PORK) LOCK FLUSH 100 UNIT/ML IV SOLN
500.0000 [IU] | INTRAVENOUS | Status: AC | PRN
  Administered 2020-03-09: 500 [IU]
  Filled 2020-03-09: qty 5

## 2020-03-09 NOTE — TOC Transition Note (Signed)
Transition of Care [facility]) - CM/SW Discharge Note   Patient Details  Name: STOY FENN MRN: [id number] Date of Birth: 28-May-1946  [facility] CM/SW Contact:  Ross Ludwig, LCSW Phone Number: 03/09/2020, 10:52 AM   Clinical Narrative:    Patient discharging home today no needs.  Patient Goals and CMS Choice        Discharge Placement   Discharge back home.                     Discharge Plan and Services                                     Social Determinants of Health (SDOH) Interventions     Readmission Risk Interventions No flowsheet data found.

## 2020-03-09 NOTE — Progress Notes (Signed)
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  Pt admitted for Acute on chronic hypoxic respiratory failure likely secondary to pleural effusions which could be malignant in etiology from his metastatic cancer    PT Comments    Pt able to ambulate longer distance today with only 1 standing rest break, no AD and no unsteadiness noted. Pt continues to fatigue with ambulation requiring cessation of conversation and pursed lip breathing to recover. Pt on 2L O2 with SpO2 94-96% while ambulating. Pt denies pain, dizziness, lightheadedness or complaints while ambulating, only reports some "pressure" at thoracocentesis site. Pt tolerates remaining up in chair at EOS. Patient will benefit from continued physical therapy in hospital and recommendations below to increase strength, balance, endurance for safe ADLs and gait.    Follow Up Recommendations  No PT follow up     Equipment Recommendations  Other (comment) (would benefit from 4 WW with seat if pt agreeable)    Recommendations for Other Services       Precautions / Restrictions Precautions Precautions: Fall Precaution Comments: 2L O2 baseline Restrictions Weight Bearing Restrictions: No    Mobility  Bed Mobility Overal bed mobility: Modified Independent  General bed mobility comments: supine to sit slightly increased time  Transfers Overall transfer level: Needs assistance Equipment used: None Transfers: Sit to/from Stand Sit to Stand: Supervision   General transfer comment: from EOB, BUE assisting to power up, good steadiness upon standing  Ambulation/Gait Ambulation/Gait assistance: Supervision Gait Distance (Feet): 180 Feet Assistive device: None Gait Pattern/deviations: Step-through pattern;Decreased stride length Gait velocity: decreased   General Gait Details: 1 standing rest break due to dyspnea, 2L O2 with SpO2 94-96%, good steadiness, no LOB, labored breathing limiting converastion while ambulating   Stairs             Wheelchair Mobility    Modified Rankin (Stroke Patients Only)       Balance Overall balance assessment: No apparent balance deficits (not formally assessed)         Cognition Arousal/Alertness: Awake/alert Behavior During Therapy: WFL for tasks assessed/performed Overall Cognitive Status: Within Functional Limits for tasks assessed     Exercises      General Comments        Pertinent Vitals/Pain Pain Assessment: Faces Pain Score:  ("not 0-10 pain, just pressure") Faces Pain Scale: Hurts a little bit Pain Location: thoracocentesis site Pain Descriptors / Indicators: Pressure Pain Intervention(s): Limited activity within patient's tolerance;Monitored during session    Home Living                      Prior Function            PT Goals (current goals can now be found in the care plan section) Acute Rehab PT Goals PT Goal Formulation: With patient Time For Goal Achievement: 03/22/20 Potential to Achieve Goals: Good Progress towards PT goals: Progressing toward goals    Frequency    Min 3X/week      PT Plan Current plan remains appropriate    Co-evaluation  AM-PAC PT "6 Clicks" Mobility   Outcome Measure  Help needed turning from your back to your side while in a flat bed without using bedrails?: None Help needed moving from lying on your back to sitting on the side of a flat bed without using bedrails?: None Help needed moving to and  from a bed to a chair (including a wheelchair)?: None Help needed standing up from a chair using your arms (e.g., wheelchair or bedside chair)?: None Help needed to walk in hospital room?: None Help needed climbing 3-5 steps with a railing? : A Little 6 Click Score: 23    End of Session Equipment Utilized During Treatment: Oxygen Activity Tolerance: Patient tolerated treatment well Patient left: in chair;with call bell/phone within reach Nurse Communication: Mobility status PT Visit Diagnosis: Difficulty in walking, not elsewhere classified (R26.2)     Time: 7628-3151 PT Time Calculation (min) (ACUTE ONLY): 22 min  Charges:  $Gait Training: 8-22 mins                      Tori Taneshia Lorence PT, DPT 03/09/20, 11:46 AM

## 2020-03-09 NOTE — Care Management Important Message (Signed)
Important Message  Patient Details IM Letter given to the Patient Name: Mathews Mathews MRN: [id number] Date of Birth: May 13, 1946   Medicare Important Message Given:  Yes     Kerin Salen 03/09/2020, 10:20 AM

## 2020-03-09 NOTE — Discharge Summary (Addendum)
Physician Discharge Summary  [name]:[id number] DOB: Nov 03, 1945 DOA: 03/07/2020  PCP: Shary Key, DO  Admit date: 03/07/2020 Discharge date: 03/09/2020  Admitted From: Home Disposition: Home   Recommendations for Outpatient Follow-up:  1. Follow up with PCP in 1-2 weeks 2. Follow up with oncology as scheduled 9/21.  3. Please follow up cytology from thoracentesis 9/16 and sputum culture (TYTR at discharge, blood cultures NGTD) 4. Repeat CXR, follow up with pulmonary as scheduled.  Home Health: None recommended by PT Equipment/Devices: None, continue 2L O2 which the patient has at home Discharge Condition: Stable CODE STATUS: DNR Diet recommendation: Heart healthy  Brief/Interim Summary: [name] Mathews is a 74 y.o. 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 He follows closely with [facility] and had completed a week of augmentin for possible pneumonia, had CTA that showed no PE but did confirm sizeable bilateral (R > L) pleural effusions, negative LE venous U/S for DVT. In the ED he appeared short of breath, afebrile, desaturating on exertion with redemonstration of pleural effusions on CXR. WBC 11.2k, BNP 87. Antibiotics started initially but later held pending sputum culture. Pulmonary consulted and performed thoracentesis 9/16 again exudative, lymphocytic, cytology pending. He remains stable on 2L O2 and PCCM has cleared for discharge with follow up already scheduled.   Discharge Diagnoses:  Principal Problem:   Acute on chronic respiratory failure with hypoxia (HCC) Active Problems:   Essential hypertension   Prostate cancer metastatic to lung [facility])   Multifocal pneumonia   Pleural effusion  Acute on chronic hypoxic respiratory failure: Primary  driver is likely pleural effusion reaccumulation +/- lymphangitic spread of malignancy. BNP unremarkable, did not respond to augmentin course, so abx not continued here. Covid negative, pt immunized.  - Sputum culture pending at time of discharge.  - s/p right thoracentesis by PCCM, Dr. Elsworth Soho 9/16 again exudative with lymphocytes. Cytology pending at discharge. Plan to monitor for reaccumulation and consider pleurx catheter if malignant effusion confirmed and effusion rapidly recurs. Has pulmonary follow up scheduled by PCCM prior to discharge. Recommend repeat CXR.   Metastatic prostate CA to bone, lung: Dx 2001 with mets noted 2013, undergoing salvage chemotherapy. - Follow up with Dr. Alen Blew per routine, due for next chemotherapy 9/21. Also due to denosumab.   HTN:  - Continue home medications.   OSA:  - Not on CPAP.   Discharge Instructions Discharge Instructions    Discharge instructions   Complete by: As directed    You were admitted for shortness of breath and had a thoracentesis performed. The final results of this are not back yet, though the pulmonary doctors have recommended discharge and that you follow up at the Pulmonary clinic. There are no new medications recommended. If your symptoms return/worsen, seek medical attention right away.     Allergies as of 03/09/2020      Reactions   Bee Venom Anaphylaxis, Shortness Of Breath, Swelling   Tongue swelling.    Wasp Venom Anaphylaxis, Shortness Of Breath, Swelling   Tongue swelling   Percocet [oxycodone-acetaminophen] Palpitations      Medication List    TAKE these medications   albuterol 108 (90 Base) MCG/ACT inhaler Commonly known as: VENTOLIN HFA Inhale 1-2 puffs into the lungs every 6 (six) hours as needed for wheezing or  shortness of breath.   dextromethorphan-guaiFENesin 30-600 MG 12hr tablet Commonly known as: MUCINEX DM Take 1 tablet by mouth 2 (two) times daily as needed for cough.   diphenoxylate-atropine  2.5-0.025 MG tablet Commonly known as: Lomotil Take 1 tablet by mouth 4 (four) times daily as needed for diarrhea or loose stools.   Ensure Active High Protein Liqd Use 3 cans daily.   gabapentin 300 MG capsule Commonly known as: NEURONTIN Take 1 capsule (300 mg total) by mouth at bedtime.   hydrochlorothiazide 25 MG tablet Commonly known as: HYDRODIURIL Take 1 tablet (25 mg total) by mouth daily. Notes to patient: Last Dose of this Medication was given on 03/09/2020 at 9:58 am   leuprolide 30 MG injection Commonly known as: LUPRON Inject 30 mg into the muscle every 4 (four) months. Notes to patient: Please continue this Medication every 4 months    mirtazapine 15 MG tablet Commonly known as: REMERON Take 1 tablet (15 mg total) by mouth at bedtime.   Opium 10 MG/ML (1%) Tinc Take 0.6 mLs (6 mg total) by mouth every 4 (four) hours as needed for diarrhea or loose stools.   OVER THE COUNTER MEDICATION Take 5 mLs by mouth daily as needed (cough). Cheritussin cough syrup   prochlorperazine 10 MG tablet Commonly known as: COMPAZINE TAKE 1 TABLET BY MOUTH EVERY 6 HOURS AS NEEDED FOR NAUSEA FOR VOMITING What changed:   how much to take  how to take this  when to take this  reasons to take this  additional instructions   Xgeva 120 MG/1.7ML Soln injection Generic drug: denosumab Inject 120 mg into the skin every 30 (thirty) days. Notes to patient: This Medication is to be taken every 30 days Please follow your home schedule        Follow-up Information    Laurin Coder, MD Follow up on 03/30/2020.   Specialty: Pulmonary Disease Why: Appt at 11:30 am.  Please arrive at 11:15 for check in.   Contact information: [place] [address] [place] [address] [phone]              Allergies  Allergen Reactions  . Bee Venom Anaphylaxis, Shortness Of Breath and Swelling    Tongue swelling.   . Wasp Venom Anaphylaxis, Shortness Of Breath and Swelling     Tongue swelling   . Percocet [Oxycodone-Acetaminophen] Palpitations    Consultations:  PCCM  Procedures/Studies: DG Chest 2 View  Result Date: 02/28/2020 CLINICAL DATA:  Shortness of breath.  Metastatic prostate carcinoma. EXAM: CHEST - 2 VIEW COMPARISON:  11/25/2019 FINDINGS: Right-sided power port remains in place. Heart size is normal. Decreased lung volumes are seen bilaterally. New small right pleural effusion is seen, with increased atelectasis or infiltrate in the right mid and lower lung zones. Numerous tiny nodular opacities are again seen throughout both lungs, consistent with metastatic disease. Diffuse sclerotic bone metastases also noted. IMPRESSION: New small right pleural effusion, and increased atelectasis or infiltrate in right mid and lower lung zones. Diffuse bilateral pulmonary nodularity, consistent with metastatic disease. Diffuse sclerotic bone metastases. Electronically Signed   By: Marlaine Hind M.D.   On: 02/28/2020 15:56   CT Angio Chest W/Cm &/Or Wo Cm  Result Date: 03/06/2020 CLINICAL DATA:  Shortness of breath.  Prostate carcinoma EXAM: CT ANGIOGRAPHY CHEST WITH CONTRAST TECHNIQUE: Multidetector CT imaging of the chest was performed using the standard protocol during bolus administration of intravenous contrast. Multiplanar CT image reconstructions and MIPs were obtained to  evaluate the vascular anatomy. CONTRAST:  121mL OMNIPAQUE IOHEXOL 350 MG/ML SOLN COMPARISON:  Chest radiograph February 28, 2020; chest CT Nov 11, 2019 FINDINGS: Cardiovascular: There is no demonstrable pulmonary embolus. There is no thoracic aortic aneurysm or dissection. Visualized great vessels appear unremarkable. Note that the right innominate and left common carotid arteries arise as a common trunk, an anatomic variant. There is aortic atherosclerosis. No pericardial effusion or pericardial thickening. There are occasional foci of coronary artery calcification. There is prominence of the main  pulmonary outflow tract measuring 3.4 cm. Port-A-Cath tip in superior vena cava. Mediastinum/Nodes: Thyroid appears unremarkable. No appreciable thoracic adenopathy. No esophageal lesions. Lungs/Pleura: There are pleural effusions bilaterally, larger on the right than on the left. There is airspace consolidation in each lower lobe consistent with a combination of compressive atelectasis and apparent pneumonia. Areas of scattered airspace consolidation also noted in portions of the right middle and right upper lobes. There is a degree of underlying interstitial thickening. Upper Abdomen: Visualized upper abdominal structures appear unremarkable. Musculoskeletal: Widespread blastic bony metastases are noted throughout the thoracic region. There is extensive gynecomastia bilaterally. There is a port along the anterior right hemithorax. Review of the MIP images confirms the above findings. IMPRESSION: 1. No appreciable pulmonary embolus. No thoracic aortic aneurysm or dissection. There is aortic atherosclerosis. There are occasional foci of coronary artery calcification. 2. Sizable pleural effusions bilaterally with multifocal pneumonia, more on the right than on the left. 3.  Apparent lymphangitic spread of tumor, also present previously. 4.  Extensive bony metastatic disease diffusely. 5. Pulmonary arterial hypertension, characterized by enlargement of the main pulmonary outflow tract. 6.  No adenopathy appreciable. 7.  Gynecomastia again noted. Aortic Atherosclerosis (ICD10-I70.0). Electronically Signed   By: Lowella Grip III M.D.   On: 03/06/2020 17:11   DG CHEST PORT 1 VIEW  Result Date: 03/08/2020 CLINICAL DATA:  Status post right thoracentesis. EXAM: PORTABLE CHEST 1 VIEW COMPARISON:  One day prior FINDINGS: Right Port-A-Cath tip low SVC. Diffuse sclerotic osseous metastasis. Normal heart size. Small right pleural effusion, minimally decreased. Trace left pleural fluid. No pneumothorax. Lower lung  predominant interstitial and airspace disease again identified. Minimal improvement in right-sided aeration. Slightly worsened left base aeration. IMPRESSION: Decrease in right pleural effusion, without pneumothorax. Shifting interstitial and airspace disease, infection and/or pulmonary edema. Electronically Signed   By: Abigail Miyamoto M.D.   On: 03/08/2020 17:38   DG Chest Port 1 View  Result Date: 03/07/2020 CLINICAL DATA:  Possible sepsis. EXAM: PORTABLE CHEST 1 VIEW COMPARISON:  02/28/2020 FINDINGS: Power port on the right has its tip in the SVC just above the right atrium. Widespread bilateral pulmonary infiltrates persist. Bilateral pleural effusions, larger on the right than the left. Allowing for technical factors, the findings appear similar to the study of 1 week ago. Sclerotic skeletal changes consistent with known metastatic disease. IMPRESSION: Similar appearance to the study of 8 days ago. Widespread bilateral pulmonary infiltrates and bilateral pleural effusions, larger on the right than the left. Electronically Signed   By: Nelson Chimes M.D.   On: 03/07/2020 12:36   VAS [place] LOWER EXTREMITY VENOUS (DVT)  Result Date: 03/02/2020  Lower Venous DVTStudy Indications: Swelling.  Risk Factors: Cancer. Comparison Study: No prior studies. Performing Technologist: Oliver Hum RVT  Examination Guidelines: A complete evaluation includes B-mode imaging, spectral Doppler, color Doppler, and power Doppler as needed of all accessible portions of each vessel. Bilateral testing is considered an integral part of a complete examination. Limited examinations  for reoccurring indications may be performed as noted. The reflux portion of the exam is performed with the patient in reverse Trendelenburg.  +-----+---------------+---------+-----------+----------+--------------+ RIGHTCompressibilityPhasicitySpontaneityPropertiesThrombus Aging +-----+---------------+---------+-----------+----------+--------------+  CFV  Full           Yes      Yes                                 +-----+---------------+---------+-----------+----------+--------------+   +---------+---------------+---------+-----------+----------+--------------+ LEFT     CompressibilityPhasicitySpontaneityPropertiesThrombus Aging +---------+---------------+---------+-----------+----------+--------------+ CFV      Full           Yes      Yes                                 +---------+---------------+---------+-----------+----------+--------------+ SFJ      Full                                                        +---------+---------------+---------+-----------+----------+--------------+ FV Prox  Full                                                        +---------+---------------+---------+-----------+----------+--------------+ FV Mid   Full                                                        +---------+---------------+---------+-----------+----------+--------------+ FV DistalFull                                                        +---------+---------------+---------+-----------+----------+--------------+ PFV      Full                                                        +---------+---------------+---------+-----------+----------+--------------+ POP      Full           Yes      Yes                                 +---------+---------------+---------+-----------+----------+--------------+ PTV      Full                                                        +---------+---------------+---------+-----------+----------+--------------+ PERO     Full                                                        +---------+---------------+---------+-----------+----------+--------------+  Summary: RIGHT: - No evidence of common femoral vein obstruction.  LEFT: - There is no evidence of deep vein thrombosis in the lower extremity.  - No cystic structure found in the popliteal fossa.  *See  table(s) above for measurements and observations. Electronically signed by Monica Martinez MD on 03/02/2020 at 4:41:32 PM.    Final    Subjective: Feels stable, not hypoxemic above baseline. Having some stable cough. No fevers. No leg swelling. Felt some right-sided chest tightness after exam which is slightly improved. Not exertional.   Discharge Exam: Vitals:   03/09/20 0427 03/09/20 1305  BP: 129/70 104/72  Pulse:  93  Resp: 18 16  Temp: 98.1 F (36.7 C) 97.8 F (36.6 C)  SpO2: 95% 93%   General: Pt is alert, awake, not in acute distress Cardiovascular: RRR, S1/S2 +, no rubs, no gallops Respiratory: Nonlabored, decreased at bases, but improved aeration on right from prior exam Abdominal: Soft, NT, ND, bowel sounds + Extremities: No edema, no cyanosis  Labs: BNP (last 3 results) Recent Labs    03/07/20 1210  BNP 10.2   Basic Metabolic Panel: Recent Labs  Lab 03/06/20 1504 03/07/20 1210 03/08/20 0521  NA 139 143 138  K 4.8 4.6 4.3  CL 99 98 97*  CO2 37* 36* 31  GLUCOSE 82 97 97  BUN 16 15 14   CREATININE 0.67 0.68 0.73  CALCIUM 8.9 8.9 8.4*   Liver Function Tests: Recent Labs  Lab 03/07/20 1210 03/08/20 1735  AST 44*  --   ALT 42  --   ALKPHOS 106  --   BILITOT 0.3  --   PROT 7.2 6.7  ALBUMIN 3.4*  --    No results for input(s): LIPASE, AMYLASE in the last 168 hours. No results for input(s): AMMONIA in the last 168 hours. CBC: Recent Labs  Lab 03/07/20 1210  WBC 11.2*  NEUTROABS 9.4*  HGB 11.0*  HCT 37.7*  MCV 88.3  PLT 302   Cardiac Enzymes: No results for input(s): CKTOTAL, CKMB, CKMBINDEX, TROPONINI in the last 168 hours. BNP: Invalid input(s): POCBNP CBG: No results for input(s): GLUCAP in the last 168 hours. D-Dimer No results for input(s): DDIMER in the last 72 hours. Hgb A1c No results for input(s): HGBA1C in the last 72 hours. Lipid Profile No results for input(s): CHOL, HDL, LDLCALC, TRIG, CHOLHDL, LDLDIRECT in the last 72  hours. Thyroid function studies No results for input(s): TSH, T4TOTAL, T3FREE, THYROIDAB in the last 72 hours.  Invalid input(s): FREET3 Anemia work up No results for input(s): VITAMINB12, FOLATE, FERRITIN, TIBC, IRON, RETICCTPCT in the last 72 hours. Urinalysis    Component Value Date/Time   COLORURINE YELLOW 03/07/2020 1210   APPEARANCEUR CLEAR 03/07/2020 1210   LABSPEC 1.034 (H) 03/07/2020 1210   PHURINE 6.0 03/07/2020 1210   GLUCOSEU NEGATIVE 03/07/2020 1210   HGBUR NEGATIVE 03/07/2020 1210   BILIRUBINUR NEGATIVE 03/07/2020 1210   KETONESUR NEGATIVE 03/07/2020 1210   PROTEINUR 30 (A) 03/07/2020 1210   NITRITE NEGATIVE 03/07/2020 1210   LEUKOCYTESUR NEGATIVE 03/07/2020 1210    Microbiology Recent Results (from the past 240 hour(s))  Blood culture (routine single)     Status: None (Preliminary result)   Collection Time: 03/07/20 12:10 PM   Specimen: BLOOD  Result Value Ref Range Status   Specimen Description   Final    BLOOD SITE NOT SPECIFIED Performed at [facility], [place] [address] [place], [place] [address]    Special Requests   Final  BOTTLES DRAWN AEROBIC AND ANAEROBIC Blood Culture results may not be optimal due to an inadequate volume of blood received in culture bottles Performed at [facility], [place] [address] [place], [place] [address]    Culture   Final    NO GROWTH 2 DAYS Performed at [facility] [address], [place], [place] [address]    Report Status PENDING  Incomplete  Urine culture     Status: None   Collection Time: 03/07/20 12:10 PM   Specimen: In/Out Cath Urine  Result Value Ref Range Status   Specimen Description   Final    IN/OUT CATH URINE Performed at [place] [address] [place], [place] [address]    Special Requests   Final    NONE Performed at [facility], [place] [address], [place], [place] [address]    Culture   Final    NO GROWTH Performed at  [facility], [place] [address], [place], [place] [address]    Report Status 03/08/2020 FINAL  Final  SARS Coronavirus 2 by RT PCR (hospital order, performed in [facility] hospital lab) Nasopharyngeal Nasopharyngeal Swab     Status: None   Collection Time: 03/07/20 12:28 PM   Specimen: Nasopharyngeal Swab  Result Value Ref Range Status   SARS Coronavirus 2 NEGATIVE NEGATIVE Final    Comment: (NOTE) SARS-CoV-2 target nucleic acids are NOT DETECTED.  The SARS-CoV-2 RNA is generally detectable in upper and lower respiratory specimens during the acute phase of infection. The lowest concentration of SARS-CoV-2 viral copies this assay can detect is 250 copies / mL. A negative result does not preclude SARS-CoV-2 infection and should not be used as the sole basis for treatment or other patient management decisions.  A negative result may occur with improper specimen collection / handling, submission of specimen other than nasopharyngeal swab, presence of viral mutation(s) within the areas targeted by this assay, and inadequate number of viral copies (<250 copies / mL). A negative result must be combined with clinical observations, patient history, and epidemiological information.  Fact Sheet for Patients:   StrictlyIdeas.no  Fact Sheet for Healthcare Providers: BankingDealers.co.za  This test is not yet approved or  cleared by the [place] FDA and has been authorized for detection and/or diagnosis of SARS-CoV-2 by FDA under an Emergency Use Authorization (EUA).  This EUA will remain in effect (meaning this test can be used) for the duration of the COVID-19 declaration under Section 564(b)(1) of the Act, 21 U.S.C. section 360bbb-3(b)(1), unless the authorization is terminated or revoked sooner.  Performed at [facility], [place] [address], [place], [place] [address]   Expectorated sputum assessment w rflx to resp  cult     Status: None   Collection Time: 03/08/20  1:13 PM   Specimen: Sputum  Result Value Ref Range Status   Specimen Description SPU  Final   Special Requests NONE  Final   Sputum evaluation   Final    THIS SPECIMEN IS ACCEPTABLE FOR SPUTUM CULTURE Performed at [facility], [place] [address] [place], [place] [address]    Report Status 03/08/2020 FINAL  Final  Culture, respiratory     Status: None (Preliminary result)   Collection Time: 03/08/20  1:13 PM   Specimen: Sputum  Result Value Ref Range Status   Specimen Description   Final    SPU Performed at [place] [address] [place], [place]  [address]    Special Requests   Final    NONE Reflexed from [id number] Performed at [facility], [place] [address], [place], [place] [address]    Gram Stain   Final    MODERATE WBC PRESENT,BOTH PMN AND MONONUCLEAR MODERATE GRAM POSITIVE COCCI MODERATE GRAM NEGATIVE RODS FEW YEAST    Culture   Final    TOO YOUNG TO READ Performed at [facility], [place] [address], [place], [place] [address]    Report Status PENDING  Incomplete  Body fluid culture     Status: None (Preliminary result)   Collection Time: 03/08/20  3:40 PM   Specimen: Pleura; Body Fluid  Result Value Ref Range Status   Specimen Description   Final    PLEURAL RIGHT Performed at [facility] [address] [place], [place] [address]    Special Requests NONE  Final   Gram Stain   Final    RARE WBC PRESENT, PREDOMINANTLY MONONUCLEAR NO ORGANISMS SEEN    Culture   Final    NO GROWTH < 24 HOURS Performed at [place] [address], [place], [place] [address]    Report Status PENDING  Incomplete    Time coordinating discharge: Approximately 40 minutes  Patrecia Pour, MD  [facility] 03/09/2020, 3:50 PM

## 2020-03-09 NOTE — Progress Notes (Signed)
Pleural fluid reviewed, exudative process by LDH with lymph predominance.  Will follow for rate of fluid recurrence and cytology to make decisions regarding pleural catheter placement.  Follow up appt arranged for patient.  See discharge section.     Noe Gens, MSN, NP-C [facility] 03/09/2020, 8:20 AM   Please see [email or web address] for pager details.

## 2020-03-09 NOTE — Progress Notes (Signed)
Pt to be discharged to home this evening. Discharge teaching including all Medications and schedules for these Medications reviewed with the Pt. Pt verbalized understanding of all discharge teaching. Discharge packet with the Pt a time of discharge

## 2020-03-11 LAB — CULTURE, RESPIRATORY W GRAM STAIN: Culture: NORMAL

## 2020-03-12 DIAGNOSIS — C61 Malignant neoplasm of prostate: Secondary | ICD-10-CM | POA: Diagnosis not present

## 2020-03-12 DIAGNOSIS — J9690 Respiratory failure, unspecified, unspecified whether with hypoxia or hypercapnia: Secondary | ICD-10-CM | POA: Diagnosis not present

## 2020-03-12 DIAGNOSIS — Z9981 Dependence on supplemental oxygen: Secondary | ICD-10-CM | POA: Diagnosis not present

## 2020-03-12 DIAGNOSIS — C7951 Secondary malignant neoplasm of bone: Secondary | ICD-10-CM | POA: Diagnosis present

## 2020-03-12 DIAGNOSIS — Z5189 Encounter for other specified aftercare: Secondary | ICD-10-CM | POA: Diagnosis not present

## 2020-03-12 DIAGNOSIS — J910 Malignant pleural effusion: Secondary | ICD-10-CM | POA: Diagnosis not present

## 2020-03-12 LAB — CULTURE, BLOOD (SINGLE): Culture: NO GROWTH

## 2020-03-12 LAB — BODY FLUID CULTURE: Culture: NO GROWTH

## 2020-03-13 ENCOUNTER — Inpatient Hospital Stay: Payer: Medicare Other

## 2020-03-13 ENCOUNTER — Other Ambulatory Visit: Payer: Self-pay

## 2020-03-13 ENCOUNTER — Encounter: Payer: Self-pay | Admitting: Nurse Practitioner

## 2020-03-13 ENCOUNTER — Inpatient Hospital Stay: Payer: Medicare Other | Admitting: Nurse Practitioner

## 2020-03-13 VITALS — BP 124/71 | HR 101 | Temp 99.8°F | Resp 15 | Ht 65.5 in | Wt 135.1 lb

## 2020-03-13 DIAGNOSIS — C61 Malignant neoplasm of prostate: Secondary | ICD-10-CM

## 2020-03-13 DIAGNOSIS — Z95828 Presence of other vascular implants and grafts: Secondary | ICD-10-CM

## 2020-03-13 LAB — CMP (CANCER CENTER ONLY)
ALT: 17 U/L (ref 0–44)
AST: 54 U/L — ABNORMAL HIGH (ref 15–41)
Albumin: 2.4 g/dL — ABNORMAL LOW (ref 3.5–5.0)
Alkaline Phosphatase: 101 U/L (ref 38–126)
Anion gap: 6 (ref 5–15)
BUN: 25 mg/dL — ABNORMAL HIGH (ref 8–23)
CO2: 36 mmol/L — ABNORMAL HIGH (ref 22–32)
Calcium: 8.8 mg/dL — ABNORMAL LOW (ref 8.9–10.3)
Chloride: 95 mmol/L — ABNORMAL LOW (ref 98–111)
Creatinine: 0.75 mg/dL (ref 0.61–1.24)
GFR, Est AFR Am: >60 mL/min (ref >60)
GFR, Estimated: >60 mL/min (ref >60)
Glucose, Bld: 95 mg/dL (ref 70–99)
Potassium: 4.2 mmol/L (ref 3.5–5.1)
Sodium: 137 mmol/L (ref 135–145)
Total Bilirubin: 0.6 mg/dL (ref 0.3–1.2)
Total Protein: 6.9 g/dL (ref 6.5–8.1)

## 2020-03-13 LAB — CBC WITH DIFFERENTIAL (CANCER CENTER ONLY)
Abs Immature Granulocytes: 0.05 10*3/uL (ref 0.00–0.07)
Basophils Absolute: 0 10*3/uL (ref 0.0–0.1)
Basophils Relative: 0 %
Eosinophils Absolute: 0 10*3/uL (ref 0.0–0.5)
Eosinophils Relative: 0 %
HCT: 31.1 % — ABNORMAL LOW (ref 39.0–52.0)
Hemoglobin: 9.4 g/dL — ABNORMAL LOW (ref 13.0–17.0)
Immature Granulocytes: 0 %
Lymphocytes Relative: 6 %
Lymphs Abs: 1 10*3/uL (ref 0.7–4.0)
MCH: 25.7 pg — ABNORMAL LOW (ref 26.0–34.0)
MCHC: 30.2 g/dL (ref 30.0–36.0)
MCV: 85 fL (ref 80.0–100.0)
Monocytes Absolute: 1.6 10*3/uL — ABNORMAL HIGH (ref 0.1–1.0)
Monocytes Relative: 11 %
Neutro Abs: 12.3 10*3/uL — ABNORMAL HIGH (ref 1.7–7.7)
Neutrophils Relative %: 83 %
Platelet Count: 294 10*3/uL (ref 150–400)
RBC: 3.66 MIL/uL — ABNORMAL LOW (ref 4.22–5.81)
RDW: 18.9 % — ABNORMAL HIGH (ref 11.5–15.5)
WBC Count: 14.9 10*3/uL — ABNORMAL HIGH (ref 4.0–10.5)
nRBC: 0 % (ref 0.0–0.2)

## 2020-03-13 LAB — CYTOLOGY - NON PAP

## 2020-03-13 MED ORDER — SODIUM CHLORIDE 0.9% FLUSH
10.0000 mL | INTRAVENOUS | Status: DC | PRN
  Administered 2020-03-13: 10 mL
  Filled 2020-03-13: qty 10

## 2020-03-13 MED ORDER — DEXAMETHASONE 4 MG PO TABS: ORAL_TABLET | ORAL | 0 refills | Status: DC

## 2020-03-13 NOTE — Progress Notes (Addendum)
[name] Mathews OFFICE PROGRESS NOTE   Diagnosis: 74 year old man with prostate cancer diagnosed in 2001 who presented with a Gleason score of 4+5 = 9.  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  Guardant 360 analysis on September 07, 2019 showed no actionable mutation.  INTERVAL HISTORY:   [name] Mathews returns as scheduled.  He completed cycle 6 Jevtana 02/21/2020.  He was hospitalized 03/07/2020 through 03/09/2020 resenting with worsening shortness of breath over 3 weeks.  Chest x-ray was concerning for multifocal pneumonia, pleural effusions.  He was started on antibiotics.  Right thoracentesis was done 03/08/2020.  Cytology showed malignant cells, immunophenotype compatible with history of prostatic adenocarcinoma.  He continues to have dyspnea on exertion.  He reports he is comfortable at rest.  He noted no significant improvement following the thoracentesis on 03/08/2020.  He coughs intermittently.  No fever.  No chills.  He notes back pain when he coughs.  He has an albuterol inhaler at home.  He is maintained on 2 L of oxygen continuously.  No change in baseline loose stools.  He denies significant nausea.  Objective:  Vital signs in last 24 hours:  Blood pressure 124/71, pulse (!) 101, temperature 99.8 F (37.7 C), temperature source Tympanic, resp. rate 15, height 5' 5.5" (1.664 m), weight 135 lb 1.6 oz (61.3 kg), SpO2 97 %.    HEENT: No thrush or ulcers. Resp: Breath sounds diminished at the lower lung fields bilaterally.  Cardio: Regular rate and rhythm. GI: No hepatomegaly. Vascular: No leg edema. Neuro: Alert and oriented. Port-A-Cath without erythema.  Lab Results:  Lab Results  Component Value Date   WBC 14.9 (H) 03/13/2020   HGB 9.4 (L) 03/13/2020   HCT 31.1 (L) 03/13/2020   MCV 85.0 03/13/2020   PLT 294 03/13/2020   NEUTROABS 12.3 (H) 03/13/2020    Imaging:  No results found.  Medications: I have reviewed the  patient's current medications.  Assessment/Plan: 1. Advanced prostate cancer with disease to bone diagnosed in 2013.  He has castration resistant disease at this time.  He is currently on active treatment with Jevtana, cycle 6 completed 02/21/2020. 2. Bone directed therapy.  He receives [place]. 3. Androgen deprivation with last injection given 10/31/2019. 4. CT chest 03/06/2020-no pulmonary embolus.  Sizable pleural effusions, multifocal pneumonia, apparent lymphangitic spread of tumor, extensive bony metastatic disease 5. Bilateral pleural effusions-right thoracentesis 03/08/2020, cytology positive for malignancy   Disposition: [name] Mathews is a 74 year old man with metastatic prostate cancer.  He has completed 6 cycles of Jevtana.  He was hospitalized last week with dyspnea.  He underwent a right thoracentesis on 03/08/2020 with no significant improvement.  The cytology has returned positive for malignancy.  This was reviewed with him at today's appointment.  [name] Mathews also reviewed the chest CT report/images from 03/06/2020 with [name] Mathews.  He understands we are concerned his symptoms are due to lymphatic tumor spread.  We are holding today's chemotherapy.  We are referring him for a chest x-ray.  He will begin dexamethasone 8 mg twice daily for 3 days, then 4 mg twice daily.  We reviewed potential side effects associated with steroids.  We are making arrangements for albuterol nebulizer treatments at home.  He will continue the albuterol inhaler until this can be arranged.  He will continue supplemental oxygen.  He will return for a follow-up appointment with [name] Mathews next week.  We are available to see him sooner if needed.  Patient  seen with [name] Mathews.    Ned Card ANP/GNP-BC   03/13/2020  1:46 PM  [name] Mathews was interviewed and examined. I reviewed x-rays from the recent hospital admission. He has metastatic prostate cancer with a malignant pleural effusion. He presents today with  progressive respiratory failure. He appears to have significant airspace disease on the recent chest CT. I am concerned he may have lymphatic tumor spread within the lungs.  He will be referred for a repeat chest x-ray today. He will begin a trial of Decadron for palliation of his symptoms. He will continue bronchodilator therapy. Christinia Gully will be placed on hold.  We will arrange for follow-up with [name] Mathews next week.  [name] Manson, MD

## 2020-03-14 LAB — PROSTATE-SPECIFIC AG, SERUM (LABCORP): Prostate Specific Ag, Serum: 505 ng/mL — ABNORMAL HIGH (ref 0.0–4.0)

## 2020-03-15 ENCOUNTER — Telehealth: Payer: Self-pay | Admitting: *Deleted

## 2020-03-15 ENCOUNTER — Ambulatory Visit: Payer: Medicare Other

## 2020-03-15 NOTE — Telephone Encounter (Signed)
Call patient to f/u on if he had his CXR on 9/21 after his visit and he said no, but he is feeling better now. He agrees to go to radiology tomorrow morning for the xray.

## 2020-03-16 ENCOUNTER — Other Ambulatory Visit: Payer: Self-pay

## 2020-03-16 ENCOUNTER — Ambulatory Visit (HOSPITAL_COMMUNITY)
Admission: RE | Admit: 2020-03-16 | Discharge: 2020-03-16 | Disposition: A | Discharge status: Home / Self Care | Payer: Medicare Other | Source: Ambulatory Visit | Attending: Nurse Practitioner | Admitting: Nurse Practitioner

## 2020-03-16 ENCOUNTER — Telehealth: Payer: Self-pay | Admitting: Adult Health

## 2020-03-16 DIAGNOSIS — C61 Malignant neoplasm of prostate: Secondary | ICD-10-CM

## 2020-03-16 MED ORDER — PROMETHAZINE-CODEINE 6.25-10 MG/5ML PO SYRP: 5.0000 mL | ORAL_SOLUTION | Freq: Two times a day (BID) | ORAL | 0 refills | Status: DC | PRN

## 2020-03-16 NOTE — Telephone Encounter (Signed)
Spoke with patient prior message. Patient stated he is not allergic to codeine.

## 2020-03-16 NOTE — Telephone Encounter (Signed)
Called and spoke with patient, he verbalized that he has no allergy to codeine.  Advised we are sending in Phenergan and codeine cough syrup, take 1 teaspoon twice daily as needed for cough.  Advised to use cautiously as it may make him sleepy.  He verbalized understanding.  Confirmed pharmacy and script sent.  Nothing further needed.

## 2020-03-16 NOTE — Telephone Encounter (Signed)
Primary Pulmonologist: Dr.Olalere Last office visit and with whom: 03/06/20 Tammy What do we see them for (pulmonary problems):Dyspena  Last OV assessment/plan:  Assessment & Plan:   Pneumonia Possible right-sided pneumonia.  No significant improvement after antibiotics.  Patient does not appear toxic on exam.  Will check labs including a BNP as this could be representative underlying fluid overload. Patient is high risk for worsening metastatic disease, pleural effusion, pneumonitis  and or PE.  We will set patient up for a repeat CT chest PE protocol Finished antibiotic.  Check sputum culture as patient is immunosuppressed.    Plan  Patient Instructions  Set up for CT chest.  Labs today .  Finish Augmentin 875mg   Sputum culture.  Mucinex DM Twice daily  As needed  Congestion.  Continue on Oxygen 2l/m .  Albuterol inhaler As needed  Wheezing  Follow up in 2 weeks with Dr. Ander Slade and As needed   Please contact office for sooner follow up if symptoms do not improve or worsen or seek emergency care       Chronic respiratory failure with hypoxia ([facility]) Continue on oxygen.  No increased oxygen demands.  Plan  Patient Instructions  Set up for CT chest.  Labs today .  Finish Augmentin 875mg   Sputum culture.  Mucinex DM Twice daily  As needed  Congestion.  Continue on Oxygen 2l/m .  Albuterol inhaler As needed  Wheezing  Follow up in 2 weeks with Dr. Ander Slade and As needed   Please contact office for sooner follow up if symptoms do not improve or worsen or seek emergency care          Rexene Edison, NP 03/06/2020     Assessment & Plan Note by Melvenia Needles, NP at 03/06/2020 3:20 PM Author: Melvenia Needles, NP Author Type: Nurse Practitioner Filed: 03/06/2020 3:24 PM  Note Status: Bernell List: Cosign Not Required Encounter Date: 03/06/2020  Problem: Pneumonia  Editor: Melvenia Needles, NP (Nurse Practitioner)      Prior Versions: 1. Parrett, Fonnie Mu,  NP (Nurse Practitioner) at 03/06/2020 3:21 PM - Written    Possible right-sided pneumonia.  No significant improvement after antibiotics.  Patient does not appear toxic on exam.  Will check labs including a BNP as this could be representative underlying fluid overload. Patient is high risk for worsening metastatic disease, pleural effusion, pneumonitis  and or PE.  We will set patient up for a repeat CT chest PE protocol Finished antibiotic.  Check sputum culture as patient is immunosuppressed.    Plan  Patient Instructions  Set up for CT chest.  Labs today .  Finish Augmentin 875mg   Sputum culture.  Mucinex DM Twice daily  As needed  Congestion.  Continue on Oxygen 2l/m .  Albuterol inhaler As needed  Wheezing  Follow up in 2 weeks with Dr. Ander Slade and As needed   Please contact office for sooner follow up if symptoms do not improve or worsen or seek emergency care         Assessment & Plan Note by Melvenia Needles, NP at 03/06/2020 3:22 PM Author: Melvenia Needles, NP Author Type: Nurse Practitioner Filed: 03/06/2020 3:22 PM  Note Status: Written Cosign: Cosign Not Required Encounter Date: 03/06/2020  Problem: Chronic respiratory failure with hypoxia [facility])  Editor: Melvenia Needles, NP (Nurse Practitioner)               Continue on oxygen.  No increased oxygen demands.  Plan  Patient Instructions  Set up for CT chest.  Labs today .  Finish Augmentin 875mg   Sputum culture.  Mucinex DM Twice daily  As needed  Congestion.  Continue on Oxygen 2l/m .  Albuterol inhaler As needed  Wheezing  Follow up in 2 weeks with Dr. Ander Slade and As needed   Please contact office for sooner follow up if symptoms do not improve or worsen or seek emergency care         Patient Instructions by Melvenia Needles, NP at 03/06/2020 2:30 PM Author: Melvenia Needles, NP Author Type: Nurse Practitioner Filed: 03/06/2020 2:52 PM  Note Status: Addendum Cosign: Cosign Not Required Encounter  Date: 03/06/2020  Editor: Melvenia Needles, NP (Nurse Practitioner)      Prior Versions: 1. Parrett, Fonnie Mu, NP (Nurse Practitioner) at 03/06/2020 2:50 PM - Addendum   2. Parrett, Fonnie Mu, NP (Nurse Practitioner) at 03/06/2020 2:49 PM - Signed    Set up for CT chest.  Labs today .  Finish Augmentin 875mg   Sputum culture.  Mucinex DM Twice daily  As needed  Congestion.  Continue on Oxygen 2l/m .  Albuterol inhaler As needed  Wheezing  Follow up in 2 weeks with Dr. Ander Slade and As needed   Please contact office for sooner follow up if symptoms do not improve or worsen or seek emergency care      Instructions    Return in about 2 weeks (around 03/20/2020) for Follow up Dr. Ander Slade. Set up for CT chest.  Labs today .  Finish Augmentin 875mg   Sputum culture.  Mucinex DM Twice daily  As needed  Congestion.  Continue on Oxygen 2l/m .  Albuterol inhaler As needed  Wheezing  Follow up in 2 weeks with Dr. Ander Slade and As needed   Please contact office for sooner follow up if symptoms do not improve or worsen or seek emergency care          After Visit Summary (Printed 03/06/2020) Communications    [facility] Provider CC Chart Rep sent to Shary Key, DO   Chart Routed to Laurin Coder, MD Media From this encounter Electronic signature on 03/06/2020 2:13 PM - 1 of 3 e-signatures recorded  Communication Routing History  Recipient Method Sent by Date Bryson, DO In Hilda, Tammy S, NP 03/06/2020     No questionnaires available.           Orders Placed      Was appointment offered to patient (explain)?     Reason for call: Patient was asking for a script of cough syrup sent in to his pharmacy.     Allergies  Allergen Reactions  . Bee Venom Anaphylaxis, Shortness Of Breath and Swelling    Tongue swelling.   . Wasp Venom Anaphylaxis, Shortness Of Breath and Swelling    Tongue swelling   . Percocet [Oxycodone-Acetaminophen] Palpitations     Immunization History  Administered Date(s) Administered  . Fluad Quad(high Dose 65+) 03/24/2019  . Influenza,inj,Quad PF,6+ Mos 03/09/2014, 06/06/2016, 05/20/2017, 02/22/2020  . PFIZER SARS-COV-2 Vaccination 08/21/2019, 09/20/2019  . Td 11/21/2016

## 2020-03-16 NOTE — Telephone Encounter (Signed)
Patient has metastatic prostate cancer with malignant pleural effusion Asked patient if he can take codeine cough syrups I can call and Phenergan with codeine cough syrup number 120 cc, 1 teaspoon twice daily as needed for cough.  Use cautiously as may make you sleepy  Only send this if he says he is not allergic to codeine as he has a Percocet allergy. If he is allergic to codeine only other option would be [employer]

## 2020-03-20 ENCOUNTER — Telehealth: Payer: Self-pay | Admitting: Oncology

## 2020-03-20 ENCOUNTER — Telehealth: Payer: Self-pay

## 2020-03-20 NOTE — Telephone Encounter (Signed)
Called pt per 9/28 sch msg - no answer. Left message with apt date and time

## 2020-03-20 NOTE — Telephone Encounter (Signed)
-----   Message from [name] Portela, MD sent at 03/20/2020  7:13 AM EDT ----- 10/13 follow up already scheduled. Nothing else for now. Thanks ----- Message ----- From: Kennedy Bucker, LPN Sent: 5/52/5894   1:32 PM EDT To: [name] Portela, MD, [facility]  Good Afternoon, Patient was admitted to the hospital on 03/07/20 for multifocal pneumonia. Was discharged on 03/09/20. Patient's chemotherapy treatment was canceled per Lattie Haw NP and Dr Learta Codding. Lattie Haw NP asked could you see the patient next week. Patient was started on Decadron today and a follow up chest x-ray was ordered by Lattie Haw NP. Please advise. Thank you.    Kim LPN

## 2020-03-21 ENCOUNTER — Ambulatory Visit: Payer: Medicare Other | Admitting: Oncology

## 2020-03-21 ENCOUNTER — Other Ambulatory Visit: Payer: Self-pay | Admitting: Oncology

## 2020-03-21 ENCOUNTER — Other Ambulatory Visit: Payer: Medicare Other

## 2020-03-21 NOTE — Progress Notes (Signed)
I reach out to [name] Mathews via phone today to check on his clinical status.  He was unaware of his follow-up appointment today and we have discussed this via phone only.  He reports feeling better from a respiratory status although he does report some mild dyspnea on exertion.  He is using oxygen denies any cough, fevers or worsening fatigue or tiredness.  Continues to be on dexamethasone twice a day which has improved his symptoms.  I recommended that continuing his current treatment including dexamethasone and he will have follow-up on October 13 and will readdress is issues at this time and determine best course of action regarding restarting chemotherapy.  All his questions were answered today.

## 2020-03-30 ENCOUNTER — Other Ambulatory Visit: Payer: Self-pay

## 2020-03-30 ENCOUNTER — Ambulatory Visit (INDEPENDENT_AMBULATORY_CARE_PROVIDER_SITE_OTHER): Payer: Medicare Other | Admitting: Pulmonary Disease

## 2020-03-30 ENCOUNTER — Encounter: Payer: Self-pay | Admitting: Pulmonary Disease

## 2020-03-30 VITALS — BP 118/78 | HR 82 | Temp 97.8°F | Ht 65.0 in | Wt 137.4 lb

## 2020-03-30 DIAGNOSIS — C7800 Secondary malignant neoplasm of unspecified lung: Secondary | ICD-10-CM | POA: Diagnosis not present

## 2020-03-30 DIAGNOSIS — R0602 Shortness of breath: Secondary | ICD-10-CM

## 2020-03-30 DIAGNOSIS — C61 Malignant neoplasm of prostate: Secondary | ICD-10-CM | POA: Diagnosis not present

## 2020-03-30 DIAGNOSIS — J90 Pleural effusion, not elsewhere classified: Secondary | ICD-10-CM | POA: Diagnosis not present

## 2020-03-30 NOTE — Patient Instructions (Addendum)
Recurrent pleural effusion secondary to metastatic prostate cancer  Shortness of breath with oxygen requirement  Continue using oxygen supplementation  Try and keep your oxygen levels above 90 You may stay off the oxygen if you able to maintain 90 without it  Inhalers as needed  Graded exercises as tolerated  Call with any significant concerns  Follow-up in 3 months

## 2020-03-30 NOTE — Progress Notes (Signed)
[name] Mathews    [id number]    08-Dec-1945  Primary Care Physician:Paige, Weldon Picking, DO  Referring Physician: Shary Key, DO [place],  [place] [id number]  Chief complaint:   Shortness of breath Recently hospitalized for pneumonia, pleural effusion Had thoracentesis performed  HPI:  Recently had thoracentesis performed in the hospital  Was discharged on home supplemental oxygen-on about 2 L of oxygen  Feeling notably better He does have an occasional cough  History of metastatic prostate cancer with lymphangitic spread, bilateral pleural effusions  Remains short of breath with activity  Remote smoking history quit in 1972 less than half a pack a day for less than 5 to 7 years  Uses inhalers, medications to help coughing  Outpatient Encounter Medications as of 03/30/2020  Medication Sig  . albuterol (VENTOLIN HFA) 108 (90 Base) MCG/ACT inhaler Inhale 1-2 puffs into the lungs every 6 (six) hours as needed for wheezing or shortness of breath.  . denosumab (XGEVA) 120 MG/1.7ML SOLN Inject 120 mg into the skin every 30 (thirty) days.  [name] dexamethasone (DECADRON) 4 MG tablet Take 8 mg twice a day for 3 days, then 4 mg twice daily  . dextromethorphan-guaiFENesin (MUCINEX DM) 30-600 MG 12hr tablet Take 1 tablet by mouth 2 (two) times daily as needed for cough.  . diphenoxylate-atropine (LOMOTIL) 2.5-0.025 MG tablet Take 1 tablet by mouth 4 (four) times daily as needed for diarrhea or loose stools.  . gabapentin (NEURONTIN) 300 MG capsule Take 1 capsule (300 mg total) by mouth at bedtime.  . hydrochlorothiazide (HYDRODIURIL) 25 MG tablet Take 1 tablet (25 mg total) by mouth daily.  [name] leuprolide (LUPRON) 30 MG injection Inject 30 mg into the muscle every 4 (four) months.  . mirtazapine (REMERON) 15 MG tablet Take 1 tablet (15 mg total) by mouth at bedtime.  . Nutritional Supplements (ENSURE ACTIVE HIGH PROTEIN) LIQD Use 3 cans daily.  [name] Opium 10 MG/ML  (1%) TINC Take 0.6 mLs (6 mg total) by mouth every 4 (four) hours as needed for diarrhea or loose stools.  [name] OVER THE COUNTER MEDICATION Take 5 mLs by mouth daily as needed (cough). Cheritussin cough syrup  . prochlorperazine (COMPAZINE) 10 MG tablet TAKE 1 TABLET BY MOUTH EVERY 6 HOURS AS NEEDED FOR NAUSEA FOR VOMITING (Patient taking differently: Take 10 mg by mouth every 6 (six) hours as needed for nausea or vomiting. )  . promethazine-codeine (PHENERGAN WITH CODEINE) 6.25-10 MG/5ML syrup Take 5 mLs by mouth 2 (two) times daily as needed for cough.   Facility-Administered Encounter Medications as of 03/30/2020  Medication  . sodium chloride flush (NS) 0.9 % injection 10 mL    Allergies as of 03/30/2020 - Review Complete 03/30/2020  Allergen Reaction Noted  . Bee venom Anaphylaxis, Shortness Of Breath, and Swelling 01/04/2016  . Wasp venom Anaphylaxis, Shortness Of Breath, and Swelling 01/04/2016  . Percocet [oxycodone-acetaminophen] Palpitations 11/04/2013    Past Medical History:  Diagnosis Date  . Hyperlipidemia   . Hypertension   . Prostate cancer ([place])    metastasis bones  . Restless leg syndrome     Past Surgical History:  Procedure Laterality Date  . IR CV LINE INJECTION  02/15/2019  . IR IMAGING GUIDED PORT INSERTION  01/11/2019  . IR THORACENTESIS ASP PLEURAL SPACE W/IMG GUIDE  11/25/2019  . PELVIC LYMPH NODE DISSECTION     prostate remains intact    Family History  Problem Relation  Age of Onset  . Heart attack Mother 61       died of heart attack  . Cervical cancer Sister 93  . Stroke Brother 72    Social History   Socioeconomic History  . Marital status: Widowed    Spouse name: Not on file  . Number of children: Not on file  . Years of education: Not on file  . Highest education level: Not on file  Occupational History  . Occupation: retired Chemical engineer: [employer]    Comment: retired  Tobacco Use  . Smoking status: Former Smoker     Packs/day: 0.25    Years: 6.00    Pack years: 1.50    Start date: 1969    Quit date: 1975    Years since quitting: 46.8  . Smokeless tobacco: Never Used  Vaping Use  . Vaping Use: Never used  Substance and Sexual Activity  . Alcohol use: No  . Drug use: No  . Sexual activity: Never  Other Topics Concern  . Not on file  Social History Narrative   Walks about 0.5 a day. Play a lot of golf. Widowed. Surrounded by his children and grandchildren. Has a Careers adviser, Felix Ahmadi.   Social Determinants of Health   Financial Resource Strain:   . Difficulty of Paying Living Expenses: Not on file  Food Insecurity:   . Worried About Charity fundraiser in the Last Year: Not on file  . Ran Out of Food in the Last Year: Not on file  Transportation Needs:   . Lack of Transportation (Medical): Not on file  . Lack of Transportation (Non-Medical): Not on file  Physical Activity:   . Days of Exercise per Week: Not on file  . Minutes of Exercise per Session: Not on file  Stress:   . Feeling of Stress : Not on file  Social Connections:   . Frequency of Communication with Friends and Family: Not on file  . Frequency of Social Gatherings with Friends and Family: Not on file  . Attends Religious Services: Not on file  . Active Member of Clubs or Organizations: Not on file  . Attends Archivist Meetings: Not on file  . Marital Status: Not on file  Intimate Partner Violence:   . Fear of Current or Ex-Partner: Not on file  . Emotionally Abused: Not on file  . Physically Abused: Not on file  . Sexually Abused: Not on file    Review of Systems  Constitutional: Negative for fatigue and fever.  Respiratory: Positive for cough and shortness of breath.   Cardiovascular: Negative for chest pain and leg swelling.  Gastrointestinal: Negative.     Vitals:   03/30/20 1137  BP: 118/78  Pulse: 82  Temp: 97.8 F (36.6 C)  SpO2: 98%     Physical Exam Constitutional:      Appearance: He  is well-developed.  Eyes:     General:        Right eye: No discharge.        Left eye: No discharge.     Pupils: Pupils are equal, round, and reactive to light.  Neck:     Thyroid: No thyromegaly.     Trachea: No tracheal deviation.  Cardiovascular:     Rate and Rhythm: Normal rate and regular rhythm.  Pulmonary:     Effort: Pulmonary effort is normal. No respiratory distress.     Breath sounds: No wheezing.  Comments: Decreased air entry right base Musculoskeletal:     Cervical back: No rigidity or tenderness.  Neurological:     Mental Status: He is alert.    Data Reviewed: Recent chest x-ray reviewed showing no pneumothorax  Recent pleural effusion did not show any infection  Assessment:  Metastatic prostate cancer Bilateral pleural effusions Shortness of breath  Continue oxygen supplementation  Bronchodilators as needed  May stay off oxygen as long as able to maintain saturations greater than 90  Plan/Recommendations: Continue lines of care  Graded exercises as tolerated  I will follow-up with him in about 3 months  Encouraged to call with any significant concerns  Prednisone seem to be helping his SOB  Sherrilyn Rist MD [facility] 03/30/2020, 11:53 AM  CC: Shary Key, DO

## 2020-04-04 ENCOUNTER — Inpatient Hospital Stay: Payer: Medicare Other

## 2020-04-04 ENCOUNTER — Ambulatory Visit: Payer: Medicare Other | Admitting: Nutrition

## 2020-04-04 ENCOUNTER — Inpatient Hospital Stay: Payer: Medicare Other | Attending: Oncology

## 2020-04-04 ENCOUNTER — Other Ambulatory Visit: Payer: Self-pay

## 2020-04-04 ENCOUNTER — Inpatient Hospital Stay: Payer: Medicare Other | Admitting: Oncology

## 2020-04-04 VITALS — BP 151/80 | HR 69 | Temp 98.1°F | Resp 18 | Wt 137.3 lb

## 2020-04-04 DIAGNOSIS — Z23 Encounter for immunization: Secondary | ICD-10-CM | POA: Diagnosis not present

## 2020-04-04 DIAGNOSIS — Z5189 Encounter for other specified aftercare: Secondary | ICD-10-CM | POA: Insufficient documentation

## 2020-04-04 DIAGNOSIS — C61 Malignant neoplasm of prostate: Secondary | ICD-10-CM | POA: Insufficient documentation

## 2020-04-04 DIAGNOSIS — C7951 Secondary malignant neoplasm of bone: Secondary | ICD-10-CM | POA: Diagnosis present

## 2020-04-04 DIAGNOSIS — Z5111 Encounter for antineoplastic chemotherapy: Secondary | ICD-10-CM | POA: Insufficient documentation

## 2020-04-04 DIAGNOSIS — Z95828 Presence of other vascular implants and grafts: Secondary | ICD-10-CM

## 2020-04-04 LAB — CBC WITH DIFFERENTIAL (CANCER CENTER ONLY)
Abs Immature Granulocytes: 0.02 10*3/uL (ref 0.00–0.07)
Basophils Absolute: 0 10*3/uL (ref 0.0–0.1)
Basophils Relative: 0 %
Eosinophils Absolute: 0.1 10*3/uL (ref 0.0–0.5)
Eosinophils Relative: 2 %
HCT: 33.6 % — ABNORMAL LOW (ref 39.0–52.0)
Hemoglobin: 10.1 g/dL — ABNORMAL LOW (ref 13.0–17.0)
Immature Granulocytes: 0 %
Lymphocytes Relative: 14 %
Lymphs Abs: 1 10*3/uL (ref 0.7–4.0)
MCH: 25.4 pg — ABNORMAL LOW (ref 26.0–34.0)
MCHC: 30.1 g/dL (ref 30.0–36.0)
MCV: 84.6 fL (ref 80.0–100.0)
Monocytes Absolute: 0.3 10*3/uL (ref 0.1–1.0)
Monocytes Relative: 5 %
Neutro Abs: 5.5 10*3/uL (ref 1.7–7.7)
Neutrophils Relative %: 79 %
Platelet Count: 214 10*3/uL (ref 150–400)
RBC: 3.97 MIL/uL — ABNORMAL LOW (ref 4.22–5.81)
RDW: 17.6 % — ABNORMAL HIGH (ref 11.5–15.5)
WBC Count: 7 10*3/uL (ref 4.0–10.5)
nRBC: 0 % (ref 0.0–0.2)

## 2020-04-04 LAB — CMP (CANCER CENTER ONLY)
ALT: <6 U/L (ref 0–44)
AST: 14 U/L — ABNORMAL LOW (ref 15–41)
Albumin: 2.3 g/dL — ABNORMAL LOW (ref 3.5–5.0)
Alkaline Phosphatase: 105 U/L (ref 38–126)
Anion gap: 7 (ref 5–15)
BUN: 13 mg/dL (ref 8–23)
CO2: 34 mmol/L — ABNORMAL HIGH (ref 22–32)
Calcium: 8.6 mg/dL — ABNORMAL LOW (ref 8.9–10.3)
Chloride: 102 mmol/L (ref 98–111)
Creatinine: 0.75 mg/dL (ref 0.61–1.24)
GFR, Estimated: >60 mL/min (ref >60)
Glucose, Bld: 84 mg/dL (ref 70–99)
Potassium: 4 mmol/L (ref 3.5–5.1)
Sodium: 143 mmol/L (ref 135–145)
Total Bilirubin: 0.3 mg/dL (ref 0.3–1.2)
Total Protein: 6 g/dL — ABNORMAL LOW (ref 6.5–8.1)

## 2020-04-04 MED ORDER — DIPHENHYDRAMINE HCL 50 MG/ML IJ SOLN
25.0000 mg | Freq: Once | INTRAMUSCULAR | Status: AC
  Administered 2020-04-04: 25 mg via INTRAVENOUS

## 2020-04-04 MED ORDER — FAMOTIDINE IN NACL 20-0.9 MG/50ML-% IV SOLN
INTRAVENOUS | Status: AC
  Filled 2020-04-04: qty 50

## 2020-04-04 MED ORDER — LEUPROLIDE ACETATE (4 MONTH) 30 MG ~~LOC~~ KIT
PACK | SUBCUTANEOUS | Status: AC
  Filled 2020-04-04: qty 30

## 2020-04-04 MED ORDER — SODIUM CHLORIDE 0.9 % IV SOLN
20.0000 mg/m2 | Freq: Once | INTRAVENOUS | Status: AC
  Administered 2020-04-04: 36 mg via INTRAVENOUS
  Filled 2020-04-04: qty 3.6

## 2020-04-04 MED ORDER — FAMOTIDINE IN NACL 20-0.9 MG/50ML-% IV SOLN
20.0000 mg | Freq: Once | INTRAVENOUS | Status: AC
  Administered 2020-04-04: 20 mg via INTRAVENOUS

## 2020-04-04 MED ORDER — SODIUM CHLORIDE 0.9% FLUSH
10.0000 mL | INTRAVENOUS | Status: DC | PRN
  Administered 2020-04-04: 10 mL
  Filled 2020-04-04: qty 10

## 2020-04-04 MED ORDER — HEPARIN SOD (PORK) LOCK FLUSH 100 UNIT/ML IV SOLN
500.0000 [IU] | Freq: Once | INTRAVENOUS | Status: AC | PRN
  Administered 2020-04-04: 500 [IU]
  Filled 2020-04-04: qty 5

## 2020-04-04 MED ORDER — DIPHENHYDRAMINE HCL 50 MG/ML IJ SOLN
INTRAMUSCULAR | Status: AC
  Filled 2020-04-04: qty 1

## 2020-04-04 MED ORDER — SODIUM CHLORIDE 0.9 % IV SOLN
Freq: Once | INTRAVENOUS | Status: AC
  Filled 2020-04-04: qty 250

## 2020-04-04 MED ORDER — SODIUM CHLORIDE 0.9 % IV SOLN
10.0000 mg | Freq: Once | INTRAVENOUS | Status: AC
  Administered 2020-04-04: 10 mg via INTRAVENOUS
  Filled 2020-04-04: qty 1
  Filled 2020-04-04: qty 10

## 2020-04-04 MED ORDER — LEUPROLIDE ACETATE (4 MONTH) 30 MG ~~LOC~~ KIT
30.0000 mg | PACK | Freq: Once | SUBCUTANEOUS | Status: AC
  Administered 2020-04-04: 30 mg via SUBCUTANEOUS

## 2020-04-04 MED ORDER — DENOSUMAB 120 MG/1.7ML ~~LOC~~ SOLN
120.0000 mg | Freq: Once | SUBCUTANEOUS | Status: AC
  Administered 2020-04-04: 120 mg via SUBCUTANEOUS

## 2020-04-04 MED ORDER — DENOSUMAB 120 MG/1.7ML ~~LOC~~ SOLN
SUBCUTANEOUS | Status: AC
  Filled 2020-04-04: qty 1.7

## 2020-04-04 NOTE — Progress Notes (Signed)
Nutrition follow-up completed with patient receiving chemotherapy for prostate cancer. Weight was documented as 137.3 pounds October 13.  This is stable since August 20. Reports his appetite is improving. Patient reports lactose intolerance. He is having occasional diarrhea of 3-4 stools for several weeks.  He takes Imodium when he is experiencing diarrhea. Declines samples of oral nutrition supplements.  He reports his children provide him with this.  He is continuing to drink these to supplement his food intake.  Nutrition diagnosis: Unintended weight loss is stabilized.  Intervention: Educated on strategies for improving oral intake in small frequent meals and snacks. Education provided on strategies for eating with diarrhea.  Provided fact sheet. Provided contact information for questions.  Monitoring, evaluation, goals: Patient will tolerate adequate calories and protein to promote weight maintenance/weight gain.  Next visit: To be scheduled as needed.  Patient agrees to contact me if he develops questions.  Please consult RD if nutrition needs are identified.  **Disclaimer: This note was dictated with voice recognition software. Similar sounding words can inadvertently be transcribed and this note may contain transcription errors which may not have been corrected upon publication of note.**

## 2020-04-04 NOTE — Progress Notes (Signed)
Hematology and Oncology Follow Up Visit  [name] Mathews [id number] 12/10/1945 74 y.o. 04/04/2020 11:43 AM    Principle Diagnosis: 73 year old man with castration-resistant prostate cancer with bone disease diagnosed in 2013.  He was initially diagnosed in 2001 with a Gleason score of 4+5 = 9.     Prior Therapy: 1. He was started on Lupron after attempted prostatectomy in 2001.    2. 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  3. He is status post radiation therapy to the prostate fossa completed in December 2018. He received 52.5 gr 20 fractions.  4. Zytiga 1000 mg po daily with prednisone 5 mg started in 09/2011.   5. Taxotere chemotherapy 75 mg per metered square with cycle 1 started on 01/19/2019.  His dose was reduced to 60 mg per metered square starting with cycle 8 of therapy.  He is status post 11 cycles of therapy.  Current therapy:   Xgeva 120 mg started on 10/31/2011.  This will be given every 6 weeks.   Eligard every 4 months.  Next injection will be in September 2021.  Jevtana chemotherapy 20 mg per metered square with cycle 1 given on Nov 08, 2019.  He is here for cycle 7 of therapy.    Interim History: [name] Mathews returns today for a follow-up visit.  Since the last visit, he reports significant improvement in his health after the start of dexamethasone.  He developed a respiratory complaints including shortness of breath or dyspnea on exertion and was hospitalized briefly in September 2021.  He was started on dexamethasone which helped his respiratory status at this time.  Still requiring oxygen but his dyspnea on exertion improved.  He is ambulating without any difficulties and able to drive without any decline in ability to do so.  He is noticing slight lower extremity edema.         Medications: Reviewed without changes. Current Outpatient Medications  Medication Sig Dispense Refill  . albuterol  (VENTOLIN HFA) 108 (90 Base) MCG/ACT inhaler Inhale 1-2 puffs into the lungs every 6 (six) hours as needed for wheezing or shortness of breath. 18 g 2  . denosumab (XGEVA) 120 MG/1.7ML SOLN Inject 120 mg into the skin every 30 (thirty) days.    [name] dexamethasone (DECADRON) 4 MG tablet Take 8 mg twice a day for 3 days, then 4 mg twice daily 40 tablet 0  . dextromethorphan-guaiFENesin (MUCINEX DM) 30-600 MG 12hr tablet Take 1 tablet by mouth 2 (two) times daily as needed for cough.    . diphenoxylate-atropine (LOMOTIL) 2.5-0.025 MG tablet Take 1 tablet by mouth 4 (four) times daily as needed for diarrhea or loose stools. 30 tablet 0  . gabapentin (NEURONTIN) 300 MG capsule Take 1 capsule (300 mg total) by mouth at bedtime. 90 capsule 3  . hydrochlorothiazide (HYDRODIURIL) 25 MG tablet Take 1 tablet (25 mg total) by mouth daily. 90 tablet 3  . leuprolide (LUPRON) 30 MG injection Inject 30 mg into the muscle every 4 (four) months.    . mirtazapine (REMERON) 15 MG tablet Take 1 tablet (15 mg total) by mouth at bedtime. 30 tablet 2  . Nutritional Supplements (ENSURE ACTIVE HIGH PROTEIN) LIQD Use 3 cans daily. 2844 mL 3  . Opium 10 MG/ML (1%) TINC Take 0.6 mLs (6 mg total) by mouth every 4 (four) hours as needed for diarrhea or loose stools. 118 mL 0  . OVER THE COUNTER MEDICATION Take  5 mLs by mouth daily as needed (cough). Cheritussin cough syrup    . prochlorperazine (COMPAZINE) 10 MG tablet TAKE 1 TABLET BY MOUTH EVERY 6 HOURS AS NEEDED FOR NAUSEA FOR VOMITING (Patient taking differently: Take 10 mg by mouth every 6 (six) hours as needed for nausea or vomiting. ) 30 tablet 3  . promethazine-codeine (PHENERGAN WITH CODEINE) 6.25-10 MG/5ML syrup Take 5 mLs by mouth 2 (two) times daily as needed for cough. 120 mL 0   No current facility-administered medications for this visit.   Facility-Administered Medications Ordered in Other Visits  Medication Dose Route Frequency Provider Last Rate Last Admin  .  sodium chloride flush (NS) 0.9 % injection 10 mL  10 mL Intracatheter PRN [name], MD   10 mL at 09/07/19 0954     Allergies:  Allergies  Allergen Reactions  . Bee Venom Anaphylaxis, Shortness Of Breath and Swelling    Tongue swelling.   . Wasp Venom Anaphylaxis, Shortness Of Breath and Swelling    Tongue swelling   . Percocet [Oxycodone-Acetaminophen] Palpitations       Physical Exam:          ECOG: 2      General appearance: Comfortable appearing without any discomfort Head: Normocephalic without any trauma Oropharynx: Mucous membranes are moist and pink without any thrush or ulcers. Eyes: Pupils are equal and round reactive to light. Lymph nodes: No cervical, supraclavicular, inguinal or axillary lymphadenopathy.   Heart:regular rate and rhythm.  S1 and S2 without leg edema. Lung:  Coarse rhonchi noted bilaterally. Abdomin: Soft, nontender, nondistended with good bowel sounds.  No hepatosplenomegaly. Musculoskeletal: No joint deformity or effusion.  Full range of motion noted. Neurological: No deficits noted on motor, sensory and deep tendon reflex exam. Skin: No petechial rash or dryness.  Appeared moist.         Lab Results: Lab Results  Component Value Date   WBC 14.9 (H) 03/13/2020   HGB 9.4 (L) 03/13/2020   HCT 31.1 (L) 03/13/2020   MCV 85.0 03/13/2020   PLT 294 03/13/2020     Chemistry      Component Value Date/Time   NA 137 03/13/2020 1230   NA 141 05/20/2017 1425   K 4.2 03/13/2020 1230   K 4.3 05/20/2017 1425   CL 95 (L) 03/13/2020 1230   CL 105 12/08/2012 1504   CO2 36 (H) 03/13/2020 1230   CO2 31 (H) 05/20/2017 1425   BUN 25 (H) 03/13/2020 1230   BUN 17.8 05/20/2017 1425   CREATININE 0.75 03/13/2020 1230   CREATININE 0.9 05/20/2017 1425      Component Value Date/Time   CALCIUM 8.8 (L) 03/13/2020 1230   CALCIUM 9.4 05/20/2017 1425   ALKPHOS 101 03/13/2020 1230   ALKPHOS 72 05/20/2017 1425   AST 54 (H) 03/13/2020 1230    AST 15 05/20/2017 1425   ALT 17 03/13/2020 1230   ALT <6 05/20/2017 1425   BILITOT 0.6 03/13/2020 1230   BILITOT 0.25 05/20/2017 1425           Results for [name] Mathews (MRN [id number]) as of 04/04/2020 11:45  Ref. Range 01/31/2020 08:08 02/21/2020 09:12 03/13/2020 12:30  Prostate Specific Ag, Serum Latest Ref Range: 0.0 - 4.0 ng/mL 203.0 (H) 244.0 (H) 505.0 (H)    IMPRESSION: 1. No appreciable pulmonary embolus. No thoracic aortic aneurysm or dissection. There is aortic atherosclerosis. There are occasional foci of coronary artery calcification.  2. Sizable pleural effusions bilaterally with multifocal pneumonia,  more on the right than on the left.  3.  Apparent lymphangitic spread of tumor, also present previously.  4.  Extensive bony metastatic disease diffusely.  5. Pulmonary arterial hypertension, characterized by enlargement of the main pulmonary outflow tract.  6.  No adenopathy appreciable.  7.  Gynecomastia again noted.  Aortic Atherosclerosis (ICD10-I70.0).    Impression and Plan:   74 year old man with:    1.  Castration-resistant prostate cancer with disease to the bone diagnosed in 2013.    He is currently receiving salvage therapy with Jevtana with reasonable response initially and clinical improvement.  At this time, his PSA has started to rise and experienced worsening pulmonary symptoms.  CT scan of the chest obtained on 03/06/2020 did not show any rapid progression of his disease in the lung but did have pleural effusion as well as potentially lymphangitic spread.  The natural course of this disease was reviewed and risks and benefits of resuming Jevtana were discussed.  Alternative treatment options are very limited at this time and potentially supportive care and hospice only would be his alternative.  He continues to desire aggressive therapy and will continue Jevtana for the time being and monitor his PSA.  2. Bone directed  therapy: He is currently receiving Xgeva every 6 weeks.  He will receive that today and repeated in 6 weeks.  Complications including hypocalcemia and osteonecrosis of the jaw were reviewed.  He is agreeable to receive it today.  3. Androgen deprivation: He is currently on Eligard which will be given every 4 months.  Last injection was given in May 2021 will be repeated in the immediate future.  Complications including radicular complexes were reviewed.  Will receive it today.   5.  IV access: Port-A-Cath currently in place without any issues.  6.  Prognosis and goals of care: His disease is incurable and any therapy is palliative at this point.  7. Covid vaccinations consideration: He will receive booster injection today.   8.  Weight loss: Appetite is improved and his weight is stable.   9.  Diarrhea: Related to chemotherapy and appears to be manageable.  10.  Dyspnea on exertion: Related to pulmonary pathology which is multifactorial in nature.  Could be related to pleural effusion, lymphangitic spread of the tumor as well as possible pneumonia.  11.  Growth factor support: He is at risk of developing neutropenia with sepsis and he will receive growth factor support after each chemotherapy.   12.  Followup: Will be in 3 weeks for the next cycle of therapy.   30  minutes were dedicated to this encounter.  Time was spent on reviewing imaging studies, clinical status, treatment options and addressing complications related to his cancer and cancer therapy.   Zola Button, MD 10/13/202111:43 AM

## 2020-04-04 NOTE — Patient Instructions (Signed)
[facility] Discharge Instructions for Patients Receiving Chemotherapy  Today you received the following chemotherapy agents: cabazitaxel.  To help prevent nausea and vomiting after your treatment, we encourage you to take your nausea medication as directed.   If you develop nausea and vomiting that is not controlled by your nausea medication, call the clinic.   BELOW ARE SYMPTOMS THAT SHOULD BE REPORTED IMMEDIATELY:  *FEVER GREATER THAN 100.5 F  *CHILLS WITH OR WITHOUT FEVER  NAUSEA AND VOMITING THAT IS NOT CONTROLLED WITH YOUR NAUSEA MEDICATION  *UNUSUAL SHORTNESS OF BREATH  *UNUSUAL BRUISING OR BLEEDING  TENDERNESS IN MOUTH AND THROAT WITH OR WITHOUT PRESENCE OF ULCERS  *URINARY PROBLEMS  *BOWEL PROBLEMS  UNUSUAL RASH Items with * indicate a potential emergency and should be followed up as soon as possible.  Feel free to call the clinic should you have any questions or concerns. The clinic phone number is [phone].  Please show the CHEMO ALERT CARD at check-in to the Emergency Department and triage nurse.   

## 2020-04-04 NOTE — Patient Instructions (Signed)
   Covid-19 Vaccination Clinic  Name:  [name] Mathews    MRN: [id number] DOB: [birth year]  04/04/2020  [name] was observed post Covid-19 immunization for 15 minutes without incident. He was provided with Vaccine Information Sheet and instruction to access the V-Safe system.   [name] was instructed to call 911 with any severe reactions post vaccine: [name] Difficulty breathing  . Swelling of face and throat  . A fast heartbeat  . A bad rash all over body  . Dizziness and weakness

## 2020-04-05 LAB — PROSTATE-SPECIFIC AG, SERUM (LABCORP): Prostate Specific Ag, Serum: 641 ng/mL — ABNORMAL HIGH (ref 0.0–4.0)

## 2020-04-06 ENCOUNTER — Other Ambulatory Visit: Payer: Self-pay

## 2020-04-06 ENCOUNTER — Inpatient Hospital Stay: Payer: Medicare Other

## 2020-04-06 VITALS — BP 134/79 | HR 108 | Temp 98.6°F | Resp 18

## 2020-04-06 DIAGNOSIS — C61 Malignant neoplasm of prostate: Secondary | ICD-10-CM

## 2020-04-06 DIAGNOSIS — Z5111 Encounter for antineoplastic chemotherapy: Secondary | ICD-10-CM | POA: Diagnosis not present

## 2020-04-06 DIAGNOSIS — Z95828 Presence of other vascular implants and grafts: Secondary | ICD-10-CM

## 2020-04-06 MED ORDER — PEGFILGRASTIM-JMDB 6 MG/0.6ML ~~LOC~~ SOSY
PREFILLED_SYRINGE | SUBCUTANEOUS | Status: AC
  Filled 2020-04-06: qty 0.6

## 2020-04-06 MED ORDER — PEGFILGRASTIM-JMDB 6 MG/0.6ML ~~LOC~~ SOSY
6.0000 mg | PREFILLED_SYRINGE | Freq: Once | SUBCUTANEOUS | Status: AC
  Administered 2020-04-06: 6 mg via SUBCUTANEOUS

## 2020-04-06 NOTE — Patient Instructions (Signed)

## 2020-04-20 ENCOUNTER — Telehealth: Payer: Self-pay | Admitting: Oncology

## 2020-04-20 NOTE — Telephone Encounter (Signed)
Scheduled per los, patient has been called and notified. 

## 2020-04-24 ENCOUNTER — Inpatient Hospital Stay: Payer: Medicare Other

## 2020-04-24 ENCOUNTER — Other Ambulatory Visit: Payer: Self-pay

## 2020-04-24 ENCOUNTER — Inpatient Hospital Stay (HOSPITAL_BASED_OUTPATIENT_CLINIC_OR_DEPARTMENT_OTHER): Payer: Medicare Other | Admitting: Oncology

## 2020-04-24 ENCOUNTER — Inpatient Hospital Stay: Payer: Medicare Other | Attending: Oncology

## 2020-04-24 DIAGNOSIS — Z5111 Encounter for antineoplastic chemotherapy: Secondary | ICD-10-CM | POA: Diagnosis not present

## 2020-04-24 DIAGNOSIS — Z95828 Presence of other vascular implants and grafts: Secondary | ICD-10-CM

## 2020-04-24 DIAGNOSIS — C61 Malignant neoplasm of prostate: Secondary | ICD-10-CM | POA: Insufficient documentation

## 2020-04-24 DIAGNOSIS — Z5189 Encounter for other specified aftercare: Secondary | ICD-10-CM | POA: Diagnosis not present

## 2020-04-24 DIAGNOSIS — C7951 Secondary malignant neoplasm of bone: Secondary | ICD-10-CM | POA: Insufficient documentation

## 2020-04-24 LAB — CBC WITH DIFFERENTIAL (CANCER CENTER ONLY)
Abs Immature Granulocytes: 0.03 10*3/uL (ref 0.00–0.07)
Basophils Absolute: 0 10*3/uL (ref 0.0–0.1)
Basophils Relative: 0 %
Eosinophils Absolute: 0 10*3/uL (ref 0.0–0.5)
Eosinophils Relative: 0 %
HCT: 35.4 % — ABNORMAL LOW (ref 39.0–52.0)
Hemoglobin: 10.3 g/dL — ABNORMAL LOW (ref 13.0–17.0)
Immature Granulocytes: 0 %
Lymphocytes Relative: 13 %
Lymphs Abs: 1.2 10*3/uL (ref 0.7–4.0)
MCH: 25.2 pg — ABNORMAL LOW (ref 26.0–34.0)
MCHC: 29.1 g/dL — ABNORMAL LOW (ref 30.0–36.0)
MCV: 86.6 fL (ref 80.0–100.0)
Monocytes Absolute: 0.7 10*3/uL (ref 0.1–1.0)
Monocytes Relative: 7 %
Neutro Abs: 7.7 10*3/uL (ref 1.7–7.7)
Neutrophils Relative %: 80 %
Platelet Count: 301 10*3/uL (ref 150–400)
RBC: 4.09 MIL/uL — ABNORMAL LOW (ref 4.22–5.81)
RDW: 18.2 % — ABNORMAL HIGH (ref 11.5–15.5)
WBC Count: 9.7 10*3/uL (ref 4.0–10.5)
nRBC: 0 % (ref 0.0–0.2)

## 2020-04-24 LAB — CMP (CANCER CENTER ONLY)
ALT: <6 U/L (ref 0–44)
AST: 15 U/L (ref 15–41)
Albumin: 2.6 g/dL — ABNORMAL LOW (ref 3.5–5.0)
Alkaline Phosphatase: 99 U/L (ref 38–126)
Anion gap: 5 (ref 5–15)
BUN: 10 mg/dL (ref 8–23)
CO2: 34 mmol/L — ABNORMAL HIGH (ref 22–32)
Calcium: 8.7 mg/dL — ABNORMAL LOW (ref 8.9–10.3)
Chloride: 104 mmol/L (ref 98–111)
Creatinine: 0.68 mg/dL (ref 0.61–1.24)
GFR, Estimated: >60 mL/min (ref >60)
Glucose, Bld: 92 mg/dL (ref 70–99)
Potassium: 4 mmol/L (ref 3.5–5.1)
Sodium: 143 mmol/L (ref 135–145)
Total Bilirubin: <0.2 mg/dL — ABNORMAL LOW (ref 0.3–1.2)
Total Protein: 6.3 g/dL — ABNORMAL LOW (ref 6.5–8.1)

## 2020-04-24 MED ORDER — DIPHENHYDRAMINE HCL 50 MG/ML IJ SOLN
INTRAMUSCULAR | Status: AC
  Filled 2020-04-24: qty 1

## 2020-04-24 MED ORDER — DEXAMETHASONE 4 MG PO TABS: ORAL_TABLET | ORAL | 3 refills | Status: DC

## 2020-04-24 MED ORDER — SODIUM CHLORIDE 0.9% FLUSH
10.0000 mL | INTRAVENOUS | Status: DC | PRN
  Administered 2020-04-24: 10 mL
  Filled 2020-04-24: qty 10

## 2020-04-24 MED ORDER — SODIUM CHLORIDE 0.9 % IV SOLN
10.0000 mg | Freq: Once | INTRAVENOUS | Status: AC
  Administered 2020-04-24: 10 mg via INTRAVENOUS
  Filled 2020-04-24: qty 10

## 2020-04-24 MED ORDER — FAMOTIDINE IN NACL 20-0.9 MG/50ML-% IV SOLN
20.0000 mg | Freq: Once | INTRAVENOUS | Status: AC
  Administered 2020-04-24: 20 mg via INTRAVENOUS

## 2020-04-24 MED ORDER — HEPARIN SOD (PORK) LOCK FLUSH 100 UNIT/ML IV SOLN
500.0000 [IU] | Freq: Once | INTRAVENOUS | Status: AC | PRN
  Administered 2020-04-24: 500 [IU]
  Filled 2020-04-24: qty 5

## 2020-04-24 MED ORDER — SODIUM CHLORIDE 0.9 % IV SOLN
20.0000 mg/m2 | Freq: Once | INTRAVENOUS | Status: AC
  Administered 2020-04-24: 36 mg via INTRAVENOUS
  Filled 2020-04-24: qty 3.6

## 2020-04-24 MED ORDER — DIPHENHYDRAMINE HCL 50 MG/ML IJ SOLN
25.0000 mg | Freq: Once | INTRAMUSCULAR | Status: AC
  Administered 2020-04-24: 25 mg via INTRAVENOUS

## 2020-04-24 MED ORDER — FAMOTIDINE IN NACL 20-0.9 MG/50ML-% IV SOLN
INTRAVENOUS | Status: AC
  Filled 2020-04-24: qty 50

## 2020-04-24 MED ORDER — SODIUM CHLORIDE 0.9 % IV SOLN
Freq: Once | INTRAVENOUS | Status: AC
  Filled 2020-04-24: qty 250

## 2020-04-24 NOTE — Patient Instructions (Signed)
[facility] Discharge Instructions for Patients Receiving Chemotherapy  Today you received the following chemotherapy agents: cabazitaxel.  To help prevent nausea and vomiting after your treatment, we encourage you to take your nausea medication as directed.   If you develop nausea and vomiting that is not controlled by your nausea medication, call the clinic.   BELOW ARE SYMPTOMS THAT SHOULD BE REPORTED IMMEDIATELY:  *FEVER GREATER THAN 100.5 F  *CHILLS WITH OR WITHOUT FEVER  NAUSEA AND VOMITING THAT IS NOT CONTROLLED WITH YOUR NAUSEA MEDICATION  *UNUSUAL SHORTNESS OF BREATH  *UNUSUAL BRUISING OR BLEEDING  TENDERNESS IN MOUTH AND THROAT WITH OR WITHOUT PRESENCE OF ULCERS  *URINARY PROBLEMS  *BOWEL PROBLEMS  UNUSUAL RASH Items with * indicate a potential emergency and should be followed up as soon as possible.  Feel free to call the clinic should you have any questions or concerns. The clinic phone number is [phone].  Please show the [facility] at check-in to the Emergency Department and triage nurse.

## 2020-04-24 NOTE — Progress Notes (Signed)
Hematology and Oncology Follow Up Visit  [name] Mathews [id number] 1946/01/13 74 y.o. 04/24/2020 11:47 AM    Principle Diagnosis: 74 year old man with advanced prostate cancer with disease to the bone documented in 2013.  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   Prior Therapy: 1. He was started on Lupron after attempted prostatectomy in 2001.    2. 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  3. He is status post radiation therapy to the prostate fossa completed in December 2018. He received 52.5 gr 20 fractions.  4. Zytiga 1000 mg po daily with prednisone 5 mg started in 09/2011.   5. Taxotere chemotherapy 75 mg per metered square with cycle 1 started on 01/19/2019.  His dose was reduced to 60 mg per metered square starting with cycle 8 of therapy.  He is status post 11 cycles of therapy.  Current therapy:   Xgeva 120 mg started on 10/31/2011.  This will be given every 6 weeks.   Eligard every 4 months.  Next injection will be in September 2021.  Jevtana chemotherapy 20 mg per metered square with cycle 1 given on Nov 08, 2019.  He is here for cycle 8 of therapy.    Interim History: [name] Mathews returns today for repeat evaluation.  Since the last visit, he reports worsening respiratory status since the last visit.  He was tapered off dexamethasone and noticed different in his symptoms.  He has reported marked dyspnea on exertion.  He denies any cough, fever or dyspnea at rest.  He denies any weight loss or appetite changes.  He is eating better and gained more weight.  Still able to live independently and able to drive.        Medications: Unchanged on review. Current Outpatient Medications  Medication Sig Dispense Refill   albuterol (VENTOLIN HFA) 108 (90 Base) MCG/ACT inhaler Inhale 1-2 puffs into the lungs every 6 (six) hours as needed for wheezing or shortness of  breath. 18 g 2   denosumab (XGEVA) 120 MG/1.7ML SOLN Inject 120 mg into the skin every 30 (thirty) days.     dexamethasone (DECADRON) 4 MG tablet Take 8 mg twice a day for 3 days, then 4 mg twice daily 40 tablet 0   dextromethorphan-guaiFENesin (MUCINEX DM) 30-600 MG 12hr tablet Take 1 tablet by mouth 2 (two) times daily as needed for cough.     diphenoxylate-atropine (LOMOTIL) 2.5-0.025 MG tablet Take 1 tablet by mouth 4 (four) times daily as needed for diarrhea or loose stools. 30 tablet 0   gabapentin (NEURONTIN) 300 MG capsule Take 1 capsule (300 mg total) by mouth at bedtime. 90 capsule 3   hydrochlorothiazide (HYDRODIURIL) 25 MG tablet Take 1 tablet (25 mg total) by mouth daily. 90 tablet 3   leuprolide (LUPRON) 30 MG injection Inject 30 mg into the muscle every 4 (four) months.     mirtazapine (REMERON) 15 MG tablet Take 1 tablet (15 mg total) by mouth at bedtime. 30 tablet 2   Nutritional Supplements (ENSURE ACTIVE HIGH PROTEIN) LIQD Use 3 cans daily. 2844 mL 3   Opium 10 MG/ML (1%) TINC Take 0.6 mLs (6 mg total) by mouth every 4 (four) hours as needed for diarrhea or loose stools. 118 mL 0   OVER THE COUNTER MEDICATION Take 5 mLs by mouth daily as needed (cough). Cheritussin cough syrup     prochlorperazine (COMPAZINE) 10 MG  tablet TAKE 1 TABLET BY MOUTH EVERY 6 HOURS AS NEEDED FOR NAUSEA FOR VOMITING (Patient taking differently: Take 10 mg by mouth every 6 (six) hours as needed for nausea or vomiting. ) 30 tablet 3   promethazine-codeine (PHENERGAN WITH CODEINE) 6.25-10 MG/5ML syrup Take 5 mLs by mouth 2 (two) times daily as needed for cough. 120 mL 0   No current facility-administered medications for this visit.   Facility-Administered Medications Ordered in Other Visits  Medication Dose Route Frequency Provider Last Rate Last Admin   sodium chloride flush (NS) 0.9 % injection 10 mL  10 mL Intracatheter PRN [name], MD   10 mL at 09/07/19 0954   sodium chloride  flush (NS) 0.9 % injection 10 mL  10 mL Intracatheter PRN [name], MD   10 mL at 04/24/20 1132     Allergies:  Allergies  Allergen Reactions   Bee Venom Anaphylaxis, Shortness Of Breath and Swelling    Tongue swelling.    Wasp Venom Anaphylaxis, Shortness Of Breath and Swelling    Tongue swelling    Percocet [Oxycodone-Acetaminophen] Palpitations       Physical Exam:      Blood pressure 136/80, pulse 88, temperature 98 F (36.7 C), temperature source Tympanic, resp. rate 18, height 5\' 5"  (1.651 m), weight 138 lb 6.4 oz (62.8 kg), SpO2 96 %.      ECOG: 2      General appearance: Alert, awake without any distress. Head: Atraumatic without abnormalities Oropharynx: Without any thrush or ulcers. Eyes: No scleral icterus. Lymph nodes: No lymphadenopathy noted in the cervical, supraclavicular, or axillary nodes Heart:regular rate and rhythm, without any murmurs or gallops.   Lung: Clear to auscultation without any rhonchi, wheezes or dullness to percussion. Abdomin: Soft, nontender without any shifting dullness or ascites. Musculoskeletal: No clubbing or cyanosis. Neurological: No motor or sensory deficits. Skin: No rashes or lesions. Psychiatric: Mood and affect appeared normal.        Lab Results: Lab Results  Component Value Date   WBC 9.7 04/24/2020   HGB 10.3 (L) 04/24/2020   HCT 35.4 (L) 04/24/2020   MCV 86.6 04/24/2020   PLT 301 04/24/2020     Chemistry      Component Value Date/Time   NA 143 04/04/2020 1145   NA 141 05/20/2017 1425   K 4.0 04/04/2020 1145   K 4.3 05/20/2017 1425   CL 102 04/04/2020 1145   CL 105 12/08/2012 1504   CO2 34 (H) 04/04/2020 1145   CO2 31 (H) 05/20/2017 1425   BUN 13 04/04/2020 1145   BUN 17.8 05/20/2017 1425   CREATININE 0.75 04/04/2020 1145   CREATININE 0.9 05/20/2017 1425      Component Value Date/Time   CALCIUM 8.6 (L) 04/04/2020 1145   CALCIUM 9.4 05/20/2017 1425   ALKPHOS 105 04/04/2020 1145    ALKPHOS 72 05/20/2017 1425   AST 14 (L) 04/04/2020 1145   AST 15 05/20/2017 1425   ALT <6 04/04/2020 1145   ALT <6 05/20/2017 1425   BILITOT 0.3 04/04/2020 1145   BILITOT 0.25 05/20/2017 1425          Results for [name] Mathews, [name] Mathews (MRN [id number]) as of 04/24/2020 11:50  Ref. Range 02/21/2020 09:12 03/13/2020 12:30 04/04/2020 11:45  Prostate Specific Ag, Serum Latest Ref Range: 0.0 - 4.0 ng/mL 244.0 (H) 505.0 (H) 641.0 (H)       Impression and Plan:   74 year old man with:    1.  Advanced prostate cancer with disease to the bone diagnosed in 2013.  He has castration-resistant at this time.  He has tolerated Jevtana chemotherapy without any major complaints.  Risks and benefits of continuing this treatment were discussed.  His PSA has been rising last 2 months although he did have clinical improvement.  He has very limited alternative options at this time to treat his cancer.  Supportive management with possible hospice enrollment would be the alternative solution.  The plan is to proceed with chemotherapy at this time and will update his staging before the next visit.  2.  Dyspnea on exertion: Multifactorial in nature related to possible lymphangitic spread of his malignancy, pleural effusion possible pneumonia.  I will restart his dexamethasone will assess his response with imaging studies before the next visit.    3. Androgen deprivation: Eligard was given on April 04, 2020 and will be repeated in 4 months.  Complications including weight gain, hot flashes were reiterated.   4.  IV access: Port-A-Cath remains in place and will be in use for chemotherapy.  5.  Prognosis and goals of care: Therapy remains palliative and overall prognosis is guarded given his advanced malignancy possible debilitation.   6.  Weight loss: Improved with increase in his weight since the last visit.   7.  Diarrhea: Manageable at this time with current antidiarrheal medication.  8. Bone  directed therapy: Last Xgeva given on April 04, 2020 and will be repeated in 6 weeks.  Complications from that including osteonecrosis of the jaw and hypocalcemia were reviewed.  9.  Growth factor support: He will receive growth factor support after each cycle of therapy utilizing Fulphila given his risk of neutropenia and sepsis.   10.  Followup: He will return in 4 weeks for repeat follow-up.   30  minutes were spent on this visit.  The time was dedicated to reviewing his disease status, discussing treatment options and future plan of care review.   Zola Button, MD 11/2/202111:47 AM

## 2020-04-25 ENCOUNTER — Telehealth: Payer: Self-pay

## 2020-04-25 LAB — PROSTATE-SPECIFIC AG, SERUM (LABCORP): Prostate Specific Ag, Serum: 225 ng/mL — ABNORMAL HIGH (ref 0.0–4.0)

## 2020-04-25 NOTE — Telephone Encounter (Signed)
-----   Message from [name], MD sent at 04/25/2020  8:48 AM EDT ----- Please let him know his PSA is down

## 2020-04-25 NOTE — Telephone Encounter (Signed)
Call patient per Dr Alen Blew with lab results. Patient verbalized understanding.

## 2020-04-26 ENCOUNTER — Inpatient Hospital Stay: Payer: Medicare Other

## 2020-04-26 ENCOUNTER — Other Ambulatory Visit: Payer: Self-pay

## 2020-04-26 VITALS — BP 155/93 | HR 88 | Temp 98.9°F | Resp 18

## 2020-04-26 DIAGNOSIS — C61 Malignant neoplasm of prostate: Secondary | ICD-10-CM

## 2020-04-26 DIAGNOSIS — Z5111 Encounter for antineoplastic chemotherapy: Secondary | ICD-10-CM | POA: Diagnosis not present

## 2020-04-26 MED ORDER — PEGFILGRASTIM-JMDB 6 MG/0.6ML ~~LOC~~ SOSY
6.0000 mg | PREFILLED_SYRINGE | Freq: Once | SUBCUTANEOUS | Status: AC
  Administered 2020-04-26: 6 mg via SUBCUTANEOUS

## 2020-04-26 MED ORDER — PEGFILGRASTIM-JMDB 6 MG/0.6ML ~~LOC~~ SOSY
PREFILLED_SYRINGE | SUBCUTANEOUS | Status: AC
  Filled 2020-04-26: qty 0.6

## 2020-05-15 ENCOUNTER — Other Ambulatory Visit: Payer: Medicare Other

## 2020-05-15 ENCOUNTER — Ambulatory Visit: Payer: Medicare Other | Admitting: Oncology

## 2020-05-15 ENCOUNTER — Ambulatory Visit: Payer: Medicare Other

## 2020-05-16 NOTE — Progress Notes (Signed)
.  The following biosimilar Udenyca ([id number]) has been selected for use in this patient per insurance.  Chanetta Moosman, PharmD  

## 2020-05-18 ENCOUNTER — Ambulatory Visit: Payer: Medicare Other

## 2020-05-23 ENCOUNTER — Inpatient Hospital Stay: Payer: Medicare Other

## 2020-05-23 ENCOUNTER — Inpatient Hospital Stay (HOSPITAL_BASED_OUTPATIENT_CLINIC_OR_DEPARTMENT_OTHER): Payer: Medicare Other | Admitting: Oncology

## 2020-05-23 ENCOUNTER — Inpatient Hospital Stay: Payer: Medicare Other | Attending: Oncology

## 2020-05-23 ENCOUNTER — Other Ambulatory Visit: Payer: Self-pay

## 2020-05-23 VITALS — BP 155/81 | HR 94 | Temp 96.9°F | Resp 18 | Ht 65.0 in | Wt 139.4 lb

## 2020-05-23 DIAGNOSIS — C7800 Secondary malignant neoplasm of unspecified lung: Secondary | ICD-10-CM | POA: Diagnosis not present

## 2020-05-23 DIAGNOSIS — C61 Malignant neoplasm of prostate: Secondary | ICD-10-CM

## 2020-05-23 DIAGNOSIS — C7951 Secondary malignant neoplasm of bone: Secondary | ICD-10-CM | POA: Diagnosis present

## 2020-05-23 DIAGNOSIS — Z5111 Encounter for antineoplastic chemotherapy: Secondary | ICD-10-CM | POA: Insufficient documentation

## 2020-05-23 DIAGNOSIS — Z5189 Encounter for other specified aftercare: Secondary | ICD-10-CM | POA: Diagnosis not present

## 2020-05-23 DIAGNOSIS — Z95828 Presence of other vascular implants and grafts: Secondary | ICD-10-CM

## 2020-05-23 LAB — CMP (CANCER CENTER ONLY)
ALT: 6 U/L (ref 0–44)
AST: 19 U/L (ref 15–41)
Albumin: 2.4 g/dL — ABNORMAL LOW (ref 3.5–5.0)
Alkaline Phosphatase: 95 U/L (ref 38–126)
Anion gap: 6 (ref 5–15)
BUN: 13 mg/dL (ref 8–23)
CO2: 35 mmol/L — ABNORMAL HIGH (ref 22–32)
Calcium: 9.2 mg/dL (ref 8.9–10.3)
Chloride: 101 mmol/L (ref 98–111)
Creatinine: 0.71 mg/dL (ref 0.61–1.24)
GFR, Estimated: >60 mL/min (ref >60)
Glucose, Bld: 102 mg/dL — ABNORMAL HIGH (ref 70–99)
Potassium: 4 mmol/L (ref 3.5–5.1)
Sodium: 142 mmol/L (ref 135–145)
Total Bilirubin: 0.2 mg/dL — ABNORMAL LOW (ref 0.3–1.2)
Total Protein: 6.6 g/dL (ref 6.5–8.1)

## 2020-05-23 LAB — CBC WITH DIFFERENTIAL (CANCER CENTER ONLY)
Abs Immature Granulocytes: 0.03 10*3/uL (ref 0.00–0.07)
Basophils Absolute: 0 10*3/uL (ref 0.0–0.1)
Basophils Relative: 0 %
Eosinophils Absolute: 0.1 10*3/uL (ref 0.0–0.5)
Eosinophils Relative: 1 %
HCT: 35.3 % — ABNORMAL LOW (ref 39.0–52.0)
Hemoglobin: 10.4 g/dL — ABNORMAL LOW (ref 13.0–17.0)
Immature Granulocytes: 0 %
Lymphocytes Relative: 10 %
Lymphs Abs: 0.8 10*3/uL (ref 0.7–4.0)
MCH: 25.4 pg — ABNORMAL LOW (ref 26.0–34.0)
MCHC: 29.5 g/dL — ABNORMAL LOW (ref 30.0–36.0)
MCV: 86.3 fL (ref 80.0–100.0)
Monocytes Absolute: 0.6 10*3/uL (ref 0.1–1.0)
Monocytes Relative: 7 %
Neutro Abs: 6.8 10*3/uL (ref 1.7–7.7)
Neutrophils Relative %: 82 %
Platelet Count: 302 10*3/uL (ref 150–400)
RBC: 4.09 MIL/uL — ABNORMAL LOW (ref 4.22–5.81)
RDW: 17.1 % — ABNORMAL HIGH (ref 11.5–15.5)
WBC Count: 8.4 10*3/uL (ref 4.0–10.5)
nRBC: 0 % (ref 0.0–0.2)

## 2020-05-23 MED ORDER — DIPHENHYDRAMINE HCL 50 MG/ML IJ SOLN
25.0000 mg | Freq: Once | INTRAMUSCULAR | Status: AC
  Administered 2020-05-23: 25 mg via INTRAVENOUS

## 2020-05-23 MED ORDER — SODIUM CHLORIDE 0.9% FLUSH
10.0000 mL | Freq: Once | INTRAVENOUS | Status: AC | PRN
  Administered 2020-05-23: 10 mL
  Filled 2020-05-23: qty 10

## 2020-05-23 MED ORDER — FAMOTIDINE IN NACL 20-0.9 MG/50ML-% IV SOLN
INTRAVENOUS | Status: AC
  Filled 2020-05-23: qty 50

## 2020-05-23 MED ORDER — DENOSUMAB 120 MG/1.7ML ~~LOC~~ SOLN
SUBCUTANEOUS | Status: AC
  Filled 2020-05-23: qty 1.7

## 2020-05-23 MED ORDER — SODIUM CHLORIDE 0.9 % IV SOLN
Freq: Once | INTRAVENOUS | Status: AC
  Filled 2020-05-23: qty 250

## 2020-05-23 MED ORDER — SODIUM CHLORIDE 0.9% FLUSH
10.0000 mL | INTRAVENOUS | Status: DC | PRN
  Administered 2020-05-23: 10 mL
  Filled 2020-05-23: qty 10

## 2020-05-23 MED ORDER — HEPARIN SOD (PORK) LOCK FLUSH 100 UNIT/ML IV SOLN
500.0000 [IU] | Freq: Once | INTRAVENOUS | Status: AC | PRN
  Administered 2020-05-23: 500 [IU]
  Filled 2020-05-23: qty 5

## 2020-05-23 MED ORDER — DIPHENHYDRAMINE HCL 50 MG/ML IJ SOLN
INTRAMUSCULAR | Status: AC
  Filled 2020-05-23: qty 1

## 2020-05-23 MED ORDER — DENOSUMAB 120 MG/1.7ML ~~LOC~~ SOLN
120.0000 mg | Freq: Once | SUBCUTANEOUS | Status: AC
  Administered 2020-05-23: 120 mg via SUBCUTANEOUS

## 2020-05-23 MED ORDER — SODIUM CHLORIDE 0.9 % IV SOLN
10.0000 mg | Freq: Once | INTRAVENOUS | Status: AC
  Administered 2020-05-23: 10 mg via INTRAVENOUS
  Filled 2020-05-23: qty 10

## 2020-05-23 MED ORDER — FAMOTIDINE IN NACL 20-0.9 MG/50ML-% IV SOLN
20.0000 mg | Freq: Once | INTRAVENOUS | Status: AC
  Administered 2020-05-23: 20 mg via INTRAVENOUS

## 2020-05-23 MED ORDER — SODIUM CHLORIDE 0.9 % IV SOLN
20.0000 mg/m2 | Freq: Once | INTRAVENOUS | Status: AC
  Administered 2020-05-23: 36 mg via INTRAVENOUS
  Filled 2020-05-23: qty 3.6

## 2020-05-23 NOTE — Progress Notes (Signed)
Hematology and Oncology Follow Up Visit  [name] Mathews [id number] 1945-06-26 74 y.o. 05/23/2020 8:11 AM    Principle Diagnosis: 74 year old man with castration-resistant advanced prostate cancer diagnosed in 2013.  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  He has also pulmonary involvement.  Guardant 360 analysis in March 2021 showed no actionable mutation.   Prior Therapy: 1. He was started on Lupron after attempted prostatectomy in 2001.    2. 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  3. He is status post radiation therapy to the prostate fossa completed in December 2018. He received 52.5 gr 20 fractions.  4. Zytiga 1000 mg po daily with prednisone 5 mg started in 09/2011.   5. Taxotere chemotherapy 75 mg per metered square with cycle 1 started on 01/19/2019.  His dose was reduced to 60 mg per metered square starting with cycle 8 of therapy.  He is status post 11 cycles of therapy.  Current therapy:   Xgeva 120 mg started on 10/31/2011.  This will be given every 6 weeks.   Eligard every 4 months.  Next injection will be in September 2021.  Jevtana chemotherapy 20 mg per metered square with cycle 1 given on Nov 08, 2019.  He is here for cycle 9 of therapy.    Interim History: [name] is here for return evaluation.  Since last visit, he reports no major changes in his health.  He continues to have issues with dyspnea on exertion and oxygen dependence but overall his respiratory status is stable.  He is ambulating short distances but limited with his dyspnea.  He still able to drive and attend to most activities of daily living.  He denies any nausea, vomiting or abdominal pain.  He denied any worsening bone pain or pathological fractures.        Medications: Updated on review. Current Outpatient Medications  Medication Sig Dispense Refill  . albuterol (VENTOLIN  HFA) 108 (90 Base) MCG/ACT inhaler Inhale 1-2 puffs into the lungs every 6 (six) hours as needed for wheezing or shortness of breath. 18 g 2  . denosumab (XGEVA) 120 MG/1.7ML SOLN Inject 120 mg into the skin every 30 (thirty) days.    [name] dexamethasone (DECADRON) 4 MG tablet Take 4 mg twice a day. 90 tablet 3  . dextromethorphan-guaiFENesin (MUCINEX DM) 30-600 MG 12hr tablet Take 1 tablet by mouth 2 (two) times daily as needed for cough.    . diphenoxylate-atropine (LOMOTIL) 2.5-0.025 MG tablet Take 1 tablet by mouth 4 (four) times daily as needed for diarrhea or loose stools. 30 tablet 0  . gabapentin (NEURONTIN) 300 MG capsule Take 1 capsule (300 mg total) by mouth at bedtime. 90 capsule 3  . hydrochlorothiazide (HYDRODIURIL) 25 MG tablet Take 1 tablet (25 mg total) by mouth daily. 90 tablet 3  . leuprolide (LUPRON) 30 MG injection Inject 30 mg into the muscle every 4 (four) months.    . mirtazapine (REMERON) 15 MG tablet Take 1 tablet (15 mg total) by mouth at bedtime. 30 tablet 2  . Nutritional Supplements (ENSURE ACTIVE HIGH PROTEIN) LIQD Use 3 cans daily. 2844 mL 3  . Opium 10 MG/ML (1%) TINC Take 0.6 mLs (6 mg total) by mouth every 4 (four) hours as needed for diarrhea or loose stools. 118 mL 0  . OVER THE COUNTER MEDICATION Take 5 mLs by mouth daily as  needed (cough). Cheritussin cough syrup    . prochlorperazine (COMPAZINE) 10 MG tablet TAKE 1 TABLET BY MOUTH EVERY 6 HOURS AS NEEDED FOR NAUSEA FOR VOMITING (Patient taking differently: Take 10 mg by mouth every 6 (six) hours as needed for nausea or vomiting. ) 30 tablet 3  . promethazine-codeine (PHENERGAN WITH CODEINE) 6.25-10 MG/5ML syrup Take 5 mLs by mouth 2 (two) times daily as needed for cough. 120 mL 0   No current facility-administered medications for this visit.   Facility-Administered Medications Ordered in Other Visits  Medication Dose Route Frequency Provider Last Rate Last Admin  . sodium chloride flush (NS) 0.9 % injection 10  mL  10 mL Intracatheter PRN [name], MD   10 mL at 09/07/19 0954  . sodium chloride flush (NS) 0.9 % injection 10 mL  10 mL Intracatheter Once PRN [name], MD         Allergies:  Allergies  Allergen Reactions  . Bee Venom Anaphylaxis, Shortness Of Breath and Swelling    Tongue swelling.   . Wasp Venom Anaphylaxis, Shortness Of Breath and Swelling    Tongue swelling   . Percocet [Oxycodone-Acetaminophen] Palpitations       Physical Exam:       Blood pressure (!) 155/81, pulse 94, temperature (!) 96.9 F (36.1 C), temperature source Tympanic, resp. rate 18, height 5\' 5"  (1.651 m), weight 139 lb 6.4 oz (63.2 kg), SpO2 94 %.      ECOG: 2     General appearance: Comfortable appearing without any discomfort Head: Normocephalic without any trauma Oropharynx: Mucous membranes are moist and pink without any thrush or ulcers. Eyes: Pupils are equal and round reactive to light. Lymph nodes: No cervical, supraclavicular, inguinal or axillary lymphadenopathy.   Heart:regular rate and rhythm.  S1 and S2 without leg edema. Lung: Clear without any rhonchi or wheezes.  No dullness to percussion. Abdomin: Soft, nontender, nondistended with good bowel sounds.  No hepatosplenomegaly. Musculoskeletal: No joint deformity or effusion.  Full range of motion noted. Neurological: No deficits noted on motor, sensory and deep tendon reflex exam. Skin: No petechial rash or dryness.  Appeared moist.          Lab Results: Lab Results  Component Value Date   WBC 9.7 04/24/2020   HGB 10.3 (L) 04/24/2020   HCT 35.4 (L) 04/24/2020   MCV 86.6 04/24/2020   PLT 301 04/24/2020     Chemistry      Component Value Date/Time   NA 143 04/24/2020 1140   NA 141 05/20/2017 1425   K 4.0 04/24/2020 1140   K 4.3 05/20/2017 1425   CL 104 04/24/2020 1140   CL 105 12/08/2012 1504   CO2 34 (H) 04/24/2020 1140   CO2 31 (H) 05/20/2017 1425   BUN 10 04/24/2020 1140   BUN 17.8  05/20/2017 1425   CREATININE 0.68 04/24/2020 1140   CREATININE 0.9 05/20/2017 1425      Component Value Date/Time   CALCIUM 8.7 (L) 04/24/2020 1140   CALCIUM 9.4 05/20/2017 1425   ALKPHOS 99 04/24/2020 1140   ALKPHOS 72 05/20/2017 1425   AST 15 04/24/2020 1140   AST 15 05/20/2017 1425   ALT <6 04/24/2020 1140   ALT <6 05/20/2017 1425   BILITOT <0.2 (L) 04/24/2020 1140   BILITOT 0.25 05/20/2017 1425           Results for JOSEANTONIO, DITTMAR (MRN [id number]) as of 05/23/2020 08:11  Ref. Range 04/04/2020 11:45 04/24/2020  11:40  Prostate Specific Ag, Serum Latest Ref Range: 0.0 - 4.0 ng/mL 641.0 (H) 225.0 (H)       Impression and Plan:   74 year old man with:    1.  Castration-resistant advanced prostate cancer with pulmonary involvement diagnosed in 2013.  His disease status was updated today and management options were reviewed.  He is currently on Jevtana which has stabilized his disease modestly at this time.  His PSA did decline between October and November which could possibly indicate overall stable disease.  He is scheduled to have repeat imaging studies on December 2 to update his staging.  Treatment options for the future would be to continue systemic chemotherapy versus supportive care only and hospice involvement.  He has rapid progression of disease that I would recommend supportive measures only.  After discussion today, we will continue with chemotherapy given his reasonable performance status and willingness to do so.  Will await the results of the CT scan to update his staging.  2.  Dyspnea on exertion: Related to previous pneumonia and likely lymphangitic spread of his malignancy.    3. Androgen deprivation: I recommended continuing Eligard at this time as long as he is receiving active treatment.  Complication occluding weight gain, hot flashes among others were reviewed.   4.  IV access: Port-A-Cath currently in use without any issues.  5.  Prognosis  and goals of care: His disease is incurable and any treatment is palliative.  He still desires aggressive measures for the time being..   6.  Weight loss: He is eating better and he has gained weight since the last visit.   7.  Diarrhea: Manageable at this time without any recent exacerbation.   8. Bone directed therapy: He is currently on Xgeva which will be repeated in 3 to 4 weeks.  Complications including osteonecrosis of the jaw and hypocalcemia were reiterated.  9.  Growth factor support: He is a risk of developing neutropenia and sepsis and orthotic support is recommended.   10.  Followup: In the next 3 to 4 weeks for repeat evaluation and next cycle of chemotherapy.   30  minutes were dedicated to this encounter.  The time was spent on reviewing his disease status, discussing treatment options and future plan of care review.  Zola Button, MD 12/1/20218:11 AM

## 2020-05-23 NOTE — Patient Instructions (Signed)
[facility] Discharge Instructions for Patients Receiving Chemotherapy  Today you received the following chemotherapy agents: Jevtana  To help prevent nausea and vomiting after your treatment, we encourage you to take your nausea medication as directed.   If you develop nausea and vomiting that is not controlled by your nausea medication, call the clinic.   BELOW ARE SYMPTOMS THAT SHOULD BE REPORTED IMMEDIATELY:  *FEVER GREATER THAN 100.5 F  *CHILLS WITH OR WITHOUT FEVER  NAUSEA AND VOMITING THAT IS NOT CONTROLLED WITH YOUR NAUSEA MEDICATION  *UNUSUAL SHORTNESS OF BREATH  *UNUSUAL BRUISING OR BLEEDING  TENDERNESS IN MOUTH AND THROAT WITH OR WITHOUT PRESENCE OF ULCERS  *URINARY PROBLEMS  *BOWEL PROBLEMS  UNUSUAL RASH Items with * indicate a potential emergency and should be followed up as soon as possible.  Feel free to call the clinic should you have any questions or concerns. The clinic phone number is [phone].  Please show the CHEMO ALERT CARD at check-in to the Emergency Department and triage nurse.   

## 2020-05-24 ENCOUNTER — Ambulatory Visit (HOSPITAL_COMMUNITY)
Admission: RE | Admit: 2020-05-24 | Discharge: 2020-05-24 | Disposition: A | Discharge status: Home / Self Care | Payer: Medicare Other | Source: Ambulatory Visit | Attending: Oncology | Admitting: Oncology

## 2020-05-24 ENCOUNTER — Encounter (HOSPITAL_COMMUNITY): Payer: Self-pay

## 2020-05-24 DIAGNOSIS — C61 Malignant neoplasm of prostate: Secondary | ICD-10-CM | POA: Insufficient documentation

## 2020-05-24 LAB — PROSTATE-SPECIFIC AG, SERUM (LABCORP): Prostate Specific Ag, Serum: 328 ng/mL — ABNORMAL HIGH (ref 0.0–4.0)

## 2020-05-24 MED ORDER — HEPARIN SOD (PORK) LOCK FLUSH 100 UNIT/ML IV SOLN
INTRAVENOUS | Status: AC
  Filled 2020-05-24: qty 5

## 2020-05-24 MED ORDER — IOHEXOL 300 MG/ML  SOLN
100.0000 mL | Freq: Once | INTRAMUSCULAR | Status: AC | PRN
  Administered 2020-05-24: 100 mL via INTRAVENOUS

## 2020-05-24 MED ORDER — HEPARIN SOD (PORK) LOCK FLUSH 100 UNIT/ML IV SOLN
500.0000 [IU] | Freq: Once | INTRAVENOUS | Status: AC
  Administered 2020-05-24: 500 [IU] via INTRAVENOUS

## 2020-05-25 ENCOUNTER — Emergency Department (HOSPITAL_COMMUNITY): Payer: Medicare Other

## 2020-05-25 ENCOUNTER — Other Ambulatory Visit: Payer: Self-pay

## 2020-05-25 ENCOUNTER — Observation Stay (HOSPITAL_COMMUNITY)
Admission: EM | Admit: 2020-05-25 | Discharge: 2020-05-26 | Disposition: A | Discharge status: Home / Self Care | Payer: Medicare Other | Attending: Internal Medicine | Admitting: Internal Medicine

## 2020-05-25 ENCOUNTER — Ambulatory Visit: Payer: Medicare Other

## 2020-05-25 ENCOUNTER — Encounter (HOSPITAL_COMMUNITY): Payer: Self-pay

## 2020-05-25 DIAGNOSIS — J910 Malignant pleural effusion: Secondary | ICD-10-CM | POA: Diagnosis not present

## 2020-05-25 DIAGNOSIS — Z8546 Personal history of malignant neoplasm of prostate: Secondary | ICD-10-CM | POA: Insufficient documentation

## 2020-05-25 DIAGNOSIS — C7800 Secondary malignant neoplasm of unspecified lung: Secondary | ICD-10-CM

## 2020-05-25 DIAGNOSIS — Z79899 Other long term (current) drug therapy: Secondary | ICD-10-CM | POA: Diagnosis not present

## 2020-05-25 DIAGNOSIS — I10 Essential (primary) hypertension: Secondary | ICD-10-CM | POA: Diagnosis present

## 2020-05-25 DIAGNOSIS — Z9981 Dependence on supplemental oxygen: Secondary | ICD-10-CM | POA: Insufficient documentation

## 2020-05-25 DIAGNOSIS — Z20822 Contact with and (suspected) exposure to covid-19: Secondary | ICD-10-CM | POA: Insufficient documentation

## 2020-05-25 DIAGNOSIS — C61 Malignant neoplasm of prostate: Secondary | ICD-10-CM | POA: Diagnosis present

## 2020-05-25 DIAGNOSIS — Z87891 Personal history of nicotine dependence: Secondary | ICD-10-CM | POA: Insufficient documentation

## 2020-05-25 DIAGNOSIS — R55 Syncope and collapse: Principal | ICD-10-CM | POA: Diagnosis present

## 2020-05-25 DIAGNOSIS — J9611 Chronic respiratory failure with hypoxia: Secondary | ICD-10-CM | POA: Diagnosis present

## 2020-05-25 DIAGNOSIS — J90 Pleural effusion, not elsewhere classified: Secondary | ICD-10-CM

## 2020-05-25 LAB — CBC
HCT: 34.4 % — ABNORMAL LOW (ref 39.0–52.0)
Hemoglobin: 10 g/dL — ABNORMAL LOW (ref 13.0–17.0)
MCH: 25.8 pg — ABNORMAL LOW (ref 26.0–34.0)
MCHC: 29.1 g/dL — ABNORMAL LOW (ref 30.0–36.0)
MCV: 88.9 fL (ref 80.0–100.0)
Platelets: 270 10*3/uL (ref 150–400)
RBC: 3.87 MIL/uL — ABNORMAL LOW (ref 4.22–5.81)
RDW: 17.1 % — ABNORMAL HIGH (ref 11.5–15.5)
WBC: 6 10*3/uL (ref 4.0–10.5)
nRBC: 0 % (ref 0.0–0.2)

## 2020-05-25 LAB — URINALYSIS, ROUTINE W REFLEX MICROSCOPIC
Bacteria, UA: NONE SEEN
Bilirubin Urine: NEGATIVE
Glucose, UA: NEGATIVE mg/dL
Hgb urine dipstick: NEGATIVE
Ketones, ur: NEGATIVE mg/dL
Leukocytes,Ua: NEGATIVE
Nitrite: NEGATIVE
Protein, ur: 30 mg/dL — AB
Specific Gravity, Urine: 1.024 (ref 1.005–1.030)
pH: 7 (ref 5.0–8.0)

## 2020-05-25 LAB — TROPONIN I (HIGH SENSITIVITY)
Troponin I (High Sensitivity): 4 ng/L (ref <18)
Troponin I (High Sensitivity): 6 ng/L (ref <18)

## 2020-05-25 LAB — BASIC METABOLIC PANEL
Anion gap: 7 (ref 5–15)
BUN: 12 mg/dL (ref 8–23)
CO2: 32 mmol/L (ref 22–32)
Calcium: 7.8 mg/dL — ABNORMAL LOW (ref 8.9–10.3)
Chloride: 101 mmol/L (ref 98–111)
Creatinine, Ser: 0.6 mg/dL — ABNORMAL LOW (ref 0.61–1.24)
GFR, Estimated: >60 mL/min (ref >60)
Glucose, Bld: 85 mg/dL (ref 70–99)
Potassium: 3.6 mmol/L (ref 3.5–5.1)
Sodium: 140 mmol/L (ref 135–145)

## 2020-05-25 LAB — CBG MONITORING, ED: Glucose-Capillary: 78 mg/dL (ref 70–99)

## 2020-05-25 MED ORDER — ACETAMINOPHEN 325 MG PO TABS: 650.0000 mg | ORAL_TABLET | Freq: Four times a day (QID) | ORAL | Status: DC | PRN

## 2020-05-25 MED ORDER — ONDANSETRON HCL 4 MG/2ML IJ SOLN: 4.0000 mg | Freq: Four times a day (QID) | INTRAMUSCULAR | Status: DC | PRN

## 2020-05-25 MED ORDER — ACETAMINOPHEN 650 MG RE SUPP: 650.0000 mg | Freq: Four times a day (QID) | RECTAL | Status: DC | PRN

## 2020-05-25 MED ORDER — ONDANSETRON HCL 4 MG PO TABS: 4.0000 mg | ORAL_TABLET | Freq: Four times a day (QID) | ORAL | Status: DC | PRN

## 2020-05-25 MED ORDER — IOHEXOL 350 MG/ML SOLN
100.0000 mL | Freq: Once | INTRAVENOUS | Status: AC | PRN
  Administered 2020-05-25: 52 mL via INTRAVENOUS

## 2020-05-25 MED ORDER — DEXAMETHASONE 4 MG PO TABS
4.0000 mg | ORAL_TABLET | Freq: Two times a day (BID) | ORAL | Status: DC
  Administered 2020-05-25 – 2020-05-26 (×2): 4 mg via ORAL
  Filled 2020-05-25 (×2): qty 1

## 2020-05-25 MED ORDER — MIRTAZAPINE 7.5 MG PO TABS
15.0000 mg | ORAL_TABLET | Freq: Every day | ORAL | Status: DC
  Administered 2020-05-25: 15 mg via ORAL
  Filled 2020-05-25: qty 2

## 2020-05-25 MED ORDER — SODIUM CHLORIDE 0.9% FLUSH
3.0000 mL | Freq: Two times a day (BID) | INTRAVENOUS | Status: DC
  Administered 2020-05-25 – 2020-05-26 (×2): 3 mL via INTRAVENOUS

## 2020-05-25 MED ORDER — DM-GUAIFENESIN ER 30-600 MG PO TB12: 1.0000 | ORAL_TABLET | Freq: Two times a day (BID) | ORAL | Status: DC | PRN

## 2020-05-25 MED ORDER — HYDROCHLOROTHIAZIDE 25 MG PO TABS
25.0000 mg | ORAL_TABLET | Freq: Every day | ORAL | Status: DC
  Administered 2020-05-26: 25 mg via ORAL
  Filled 2020-05-25: qty 1

## 2020-05-25 MED ORDER — ALBUTEROL SULFATE HFA 108 (90 BASE) MCG/ACT IN AERS: 1.0000 | INHALATION_SPRAY | Freq: Four times a day (QID) | RESPIRATORY_TRACT | Status: DC | PRN

## 2020-05-25 MED ORDER — LEUPROLIDE ACETATE (4 MONTH) 30 MG IM KIT: 30.0000 mg | PACK | INTRAMUSCULAR | Status: DC

## 2020-05-25 MED ORDER — GABAPENTIN 300 MG PO CAPS
300.0000 mg | ORAL_CAPSULE | Freq: Every day | ORAL | Status: DC
  Administered 2020-05-25: 300 mg via ORAL
  Filled 2020-05-25: qty 1

## 2020-05-25 NOTE — H&P (Signed)
History and Physical    Alfreddie Consalvo ZMO:[id number] DOB: January 01, 1946 DOA: 05/25/2020  PCP: Shary Key, DO  Patient coming from: Home  I have personally briefly reviewed patient's old medical records in [place]  Chief Complaint: Syncope SOB  HPI: Richar Dunklee is a 74 y.o. 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  Pt with progressively worsening SOB, DOE for past few weeks.  Dr. Alen Blew started pt on Decadron to see if this would help but hasnt really helped per pt.  Today pt was up, walking around, felt "woozy".  Sat down in chair, and had syncope.  Currently not SOB as long as he is 100% at rest, any activity causes SOB he says.  On 2L O2 at baseline.   ED Course: Satting okay on 2L at rest.  CTA chest: 1) no PE 2) Multifocal pulm mets are grossly unchanged from Sept 2021 but progressive from May 2021.  Lymphangitic carcinomatosis, moderate B pleural effusions R>L.   Review of Systems: As per HPI, otherwise all review of systems negative.  Past Medical History:  Diagnosis Date  . Hyperlipidemia   . Hypertension   . Prostate cancer ([place])    metastasis bones  . Restless leg syndrome     Past Surgical History:  Procedure Laterality Date  . IR CV LINE INJECTION  02/15/2019  . IR IMAGING GUIDED PORT INSERTION  01/11/2019  . IR THORACENTESIS ASP PLEURAL SPACE W/IMG GUIDE  11/25/2019  . PELVIC LYMPH NODE DISSECTION     prostate remains intact     reports that he quit smoking about 46 years ago. He started smoking about 52 years ago. He has a 1.50 pack-year smoking history. He has never used smokeless tobacco. He reports that he does not drink alcohol and does not use drugs.  Allergies  Allergen Reactions  . Bee Venom Anaphylaxis, Shortness Of Breath and  Swelling    Tongue swelling.   . Wasp Venom Anaphylaxis, Shortness Of Breath and Swelling    Tongue swelling   . Percocet [Oxycodone-Acetaminophen] Palpitations    Family History  Problem Relation Age of Onset  . Heart attack Mother 31       died of heart attack  . Cervical cancer Sister 62  . Stroke Brother 50     Prior to Admission medications   Medication Sig Start Date End Date Taking? Authorizing Provider  albuterol (VENTOLIN HFA) 108 (90 Base) MCG/ACT inhaler Inhale 1-2 puffs into the lungs every 6 (six) hours as needed for wheezing or shortness of breath. 01/23/20  Yes Olalere, Adewale A, MD  denosumab (XGEVA) 120 MG/1.7ML SOLN Inject 120 mg into the skin every 30 (thirty) days.   Yes [provider]  dexamethasone (DECADRON) 4 MG tablet Take 4 mg twice a day. Patient taking differently: Take 4 mg by mouth 2 (two) times daily.  04/24/20  Yes [name], MD  dextromethorphan-guaiFENesin [facility] DM) 30-600 MG 12hr tablet Take 1 tablet by mouth 2 (two) times daily as needed for cough.   Yes [provider]  gabapentin (NEURONTIN) 300 MG capsule Take 1 capsule (300 mg total) by mouth at bedtime. 08/17/19  Yes [name], MD  hydrochlorothiazide (HYDRODIURIL) 25 MG tablet Take 1 tablet (25 mg total) by mouth daily. 08/22/19  Yes  Guadalupe Dawn, MD  leuprolide (LUPRON) 30 MG injection Inject 30 mg into the muscle every 4 (four) months.   Yes [provider]  Loperamide HCl (IMODIUM PO) Take 2 capsules by mouth in the morning and at bedtime.   Yes [provider]  mirtazapine (REMERON) 15 MG tablet Take 1 tablet (15 mg total) by mouth at bedtime. 02/10/20  Yes Autry-Lott, Naaman Plummer, DO  Nutritional Supplements (ENSURE ACTIVE HIGH PROTEIN) LIQD Use 3 cans daily. Patient taking differently: Take 1 Can by mouth in the morning and at bedtime. . 01/31/20  Yes [name], MD  OVER THE COUNTER MEDICATION Take 5 mLs by mouth daily as needed (cough).  Cheritussin cough syrup   Yes [provider]  prochlorperazine (COMPAZINE) 10 MG tablet TAKE 1 TABLET BY MOUTH EVERY 6 HOURS AS NEEDED FOR NAUSEA FOR VOMITING Patient taking differently: Take 10 mg by mouth every 6 (six) hours as needed for nausea or vomiting.  06/14/19  Yes [name], MD    Physical Exam: Vitals:   05/25/20 1745 05/25/20 1800 05/25/20 1815 05/25/20 1830  BP:    125/71  Pulse: 87 89 96 99  Resp:    (!) 22  Temp:      TempSrc:      SpO2: 100% 99% 99% 98%  Weight:      Height:        Constitutional: NAD, calm, comfortable Eyes: PERRL, lids and conjunctivae normal ENMT: Mucous membranes are moist. Posterior pharynx clear of any exudate or lesions.Normal dentition.  Neck: normal, supple, no masses, no thyromegaly Respiratory: clear to auscultation bilaterally, no wheezing, no crackles. Normal respiratory effort. No accessory muscle use.  Cardiovascular: Regular rate and rhythm, no murmurs / rubs / gallops. No extremity edema. 2+ pedal pulses. No carotid bruits.  Abdomen: no tenderness, no masses palpated. No hepatosplenomegaly. Bowel sounds positive.  Musculoskeletal: no clubbing / cyanosis. No joint deformity upper and lower extremities. Good ROM, no contractures. Normal muscle tone.  Skin: no rashes, lesions, ulcers. No induration Neurologic: CN 2-12 grossly intact. Sensation intact, DTR normal. Strength 5/5 in all 4.  Psychiatric: Normal judgment and insight. Alert and oriented x 3. Normal mood.    Labs on Admission: I have personally reviewed following labs and imaging studies  CBC: Recent Labs  Lab 05/23/20 0817 05/25/20 1600  WBC 8.4 6.0  NEUTROABS 6.8  --   HGB 10.4* 10.0*  HCT 35.3* 34.4*  MCV 86.3 88.9  PLT 302 786   Basic Metabolic Panel: Recent Labs  Lab 05/23/20 0817 05/25/20 1600  NA 142 140  K 4.0 3.6  CL 101 101  CO2 35* 32  GLUCOSE 102* 85  BUN 13 12  CREATININE 0.71 0.60*  CALCIUM 9.2 7.8*   GFR: Estimated  Creatinine Clearance: 70.5 mL/min (A) (by C-G formula based on SCr of 0.6 mg/dL (L)). Liver Function Tests: Recent Labs  Lab 05/23/20 0817  AST 19  ALT 6  ALKPHOS 95  BILITOT 0.2*  PROT 6.6  ALBUMIN 2.4*   No results for input(s): LIPASE, AMYLASE in the last 168 hours. No results for input(s): AMMONIA in the last 168 hours. Coagulation Profile: No results for input(s): INR, PROTIME in the last 168 hours. Cardiac Enzymes: No results for input(s): CKTOTAL, CKMB, CKMBINDEX, TROPONINI in the last 168 hours. BNP (last 3 results) Recent Labs    03/06/20 1504  PROBNP 78.0   HbA1C: No results for input(s): HGBA1C in the last 72 hours. CBG: Recent  Labs  Lab 05/25/20 1506  GLUCAP 78   Lipid Profile: No results for input(s): CHOL, HDL, LDLCALC, TRIG, CHOLHDL, LDLDIRECT in the last 72 hours. Thyroid Function Tests: No results for input(s): TSH, T4TOTAL, FREET4, T3FREE, THYROIDAB in the last 72 hours. Anemia Panel: No results for input(s): VITAMINB12, FOLATE, FERRITIN, TIBC, IRON, RETICCTPCT in the last 72 hours. Urine analysis:    Component Value Date/Time   COLORURINE YELLOW 05/25/2020 1522   APPEARANCEUR CLEAR 05/25/2020 1522   LABSPEC 1.024 05/25/2020 1522   PHURINE 7.0 05/25/2020 1522   GLUCOSEU NEGATIVE 05/25/2020 1522   HGBUR NEGATIVE 05/25/2020 1522   BILIRUBINUR NEGATIVE 05/25/2020 1522   [place] 05/25/2020 1522   PROTEINUR 30 (A) 05/25/2020 1522   NITRITE NEGATIVE 05/25/2020 1522   LEUKOCYTESUR NEGATIVE 05/25/2020 1522    Radiological Exams on Admission: DG Chest 1 View  Result Date: 05/25/2020 CLINICAL DATA:  Syncope, metastatic prostate cancer EXAM: CHEST  1 VIEW COMPARISON:  05/24/2020, 03/16/2020 FINDINGS: Single frontal view of the chest demonstrates stable right chest wall port. Multifocal bilateral areas of lung consolidation or consistent with known pulmonary metastases. Small bilateral pleural effusions are unchanged. No pneumothorax.  Sclerotic lesions within the thoracic cage bilateral humeri are consistent with known metastases. IMPRESSION: 1. Stable metastatic prostate cancer, with bony and pulmonary metastases as above. 2. Stable bilateral pleural effusions. Electronically Signed   By: Randa Ngo M.D.   On: 05/25/2020 16:02   CT Head Wo Contrast  Result Date: 05/25/2020 CLINICAL DATA:  74 year old male with neck trauma. EXAM: CT HEAD WITHOUT CONTRAST CT CERVICAL SPINE WITHOUT CONTRAST TECHNIQUE: Multidetector CT imaging of the head and cervical spine was performed following the standard protocol without intravenous contrast. Multiplanar CT image reconstructions of the cervical spine were also generated. COMPARISON:  Head CT dated 10/05/2019. FINDINGS: CT HEAD FINDINGS Brain: The ventricles and sulci appropriate size for patient's age. The gray-white matter discrimination is preserved. There is no acute intracranial hemorrhage. No mass effect or midline shift no extra-axial fluid collection. Vascular: No hyperdense vessel or unexpected calcification. Skull: Normal. Negative for fracture or focal lesion. Sinuses/Orbits: Mild mucoperiosteal thickening of paranasal sinuses with opacification of the right sphenoid sinus. No air-fluid level. The mastoid air cells are clear. Other: None CT CERVICAL SPINE FINDINGS Alignment: No acute subluxation. Skull base and vertebrae: There is no acute fracture. Extensive sclerotic metastatic disease throughout the cervical spine. Soft tissues and spinal canal: No prevertebral fluid or swelling. No visible canal hematoma. Disc levels: Multilevel degenerative changes with endplate irregularity and disc space narrowing. Upper chest: Partially visualized small bilateral pleural effusions. Background of emphysema and diffuse interstitial coarsening. Other: Partially visualized right-sided Port-A-Cath. IMPRESSION: 1. No acute intracranial pathology. 2. No acute/traumatic cervical spine pathology. 3. Extensive  sclerotic metastatic disease throughout the cervical spine and keeping with known prostate cancer. 4. Partially visualized small bilateral pleural effusions. 5. Emphysema (ICD10-J43.9). Electronically Signed   By: Anner Crete M.D.   On: 05/25/2020 18:17   CT Angio Chest PE W/Cm &/Or Wo Cm  Result Date: 05/25/2020 CLINICAL DATA:  Loss of consciousness, metastatic prostate cancer, headache EXAM: CT ANGIOGRAPHY CHEST WITH CONTRAST TECHNIQUE: Multidetector CT imaging of the chest was performed using the standard protocol during bolus administration of intravenous contrast. Multiplanar CT image reconstructions and MIPs were obtained to evaluate the vascular anatomy. CONTRAST:  33mL OMNIPAQUE IOHEXOL 350 MG/ML SOLN COMPARISON:  05/24/2020 FINDINGS: Cardiovascular: This is a technically adequate evaluation of the pulmonary vasculature. No filling defects or pulmonary emboli.  The heart is unremarkable without pericardial effusion. Thoracic aorta is stable with mild atherosclerosis. Mediastinum/Nodes: No enlarged mediastinal, hilar, or axillary lymph nodes. Thyroid gland, trachea, and esophagus demonstrate no significant findings. Lungs/Pleura: Multifocal bilateral consolidation and multiple pulmonary nodules consistent with metastatic disease, unchanged since staging CT performed yesterday. Stable bilateral pleural effusions. No pneumothorax. Upper Abdomen: High attenuation material within the gallbladder likely reflects vicarious excretion of previously administered contrast. No acute upper abdominal finding. Musculoskeletal: Extensive multifocal bony metastases are unchanged since staging CT. No acute fractures. Reconstructed images demonstrate no additional findings. Review of the MIP images confirms the above findings. IMPRESSION: 1. No evidence of pulmonary embolus. 2. Stable metastatic prostate cancer with multifocal areas of lung consolidation, pulmonary nodules, and diffuse sclerotic bony metastases. 3.  Stable pleural effusions. Electronically Signed   By: Randa Ngo M.D.   On: 05/25/2020 18:12   CT Cervical Spine Wo Contrast  Result Date: 05/25/2020 CLINICAL DATA:  74 year old male with neck trauma. EXAM: CT HEAD WITHOUT CONTRAST CT CERVICAL SPINE WITHOUT CONTRAST TECHNIQUE: Multidetector CT imaging of the head and cervical spine was performed following the standard protocol without intravenous contrast. Multiplanar CT image reconstructions of the cervical spine were also generated. COMPARISON:  Head CT dated 10/05/2019. FINDINGS: CT HEAD FINDINGS Brain: The ventricles and sulci appropriate size for patient's age. The gray-white matter discrimination is preserved. There is no acute intracranial hemorrhage. No mass effect or midline shift no extra-axial fluid collection. Vascular: No hyperdense vessel or unexpected calcification. Skull: Normal. Negative for fracture or focal lesion. Sinuses/Orbits: Mild mucoperiosteal thickening of paranasal sinuses with opacification of the right sphenoid sinus. No air-fluid level. The mastoid air cells are clear. Other: None CT CERVICAL SPINE FINDINGS Alignment: No acute subluxation. Skull base and vertebrae: There is no acute fracture. Extensive sclerotic metastatic disease throughout the cervical spine. Soft tissues and spinal canal: No prevertebral fluid or swelling. No visible canal hematoma. Disc levels: Multilevel degenerative changes with endplate irregularity and disc space narrowing. Upper chest: Partially visualized small bilateral pleural effusions. Background of emphysema and diffuse interstitial coarsening. Other: Partially visualized right-sided Port-A-Cath. IMPRESSION: 1. No acute intracranial pathology. 2. No acute/traumatic cervical spine pathology. 3. Extensive sclerotic metastatic disease throughout the cervical spine and keeping with known prostate cancer. 4. Partially visualized small bilateral pleural effusions. 5. Emphysema (ICD10-J43.9).  Electronically Signed   By: Anner Crete M.D.   On: 05/25/2020 18:17   CT CHEST ABDOMEN PELVIS W CONTRAST  Result Date: 05/25/2020 CLINICAL DATA:  Metastatic prostate cancer, restaging EXAM: CT CHEST, ABDOMEN, AND PELVIS WITH CONTRAST TECHNIQUE: Multidetector CT imaging of the chest, abdomen and pelvis was performed following the standard protocol during bolus administration of intravenous contrast. CONTRAST:  133mL OMNIPAQUE IOHEXOL 300 MG/ML  SOLN COMPARISON:  CTA chest dated 03/06/2020. CT chest dated 11/11/2019. CT chest abdomen pelvis dated 10/05/2019. FINDINGS: CT CHEST FINDINGS Cardiovascular: The heart is normal in size. No pericardial effusion. No evidence of thoracic aortic aneurysm. Mild atherosclerotic calcifications of the aortic arch. Three vessel coronary atherosclerosis. Right chest port terminates at the cavoatrial junction. Enlargement of the main pulmonary artery, suggesting pulmonary arterial hypertension. Mediastinum/Nodes: No suspicious mediastinal lymphadenopathy. Visualized thyroid is unremarkable. Lungs/Pleura: Moderate bilateral pleural effusions, right greater than left. Patchy pulmonary opacities in the lungs bilaterally, predominantly subpleural/peripheral, with interstitial thickening in the lungs bilaterally. This appearance is unchanged from recent CTA chest but progressive from May 2021. Despite the consolidative appearance, this favors parenchymal metastases over infection/inflammation, with superimposed lymphangitic carcinomatosis in the right  middle lobe and bilateral lower lobes. A more typical rounded metastasis in the medial left upper lobe measures 10 x 10 mm (series 6/image 39), previously 6 x 9 mm in may. A more atypical patchy nodular opacity in the anterior left lower lobe measures 2.6 x 1.8 cm (series 6/image 87), previously 2.3 x 1.1 cm in May. No pneumothorax. Musculoskeletal: Multifocal sclerotic osseous metastases throughout the visualized axial and  appendicular skeleton, grossly unchanged from priors, without pathologic fracture. CT ABDOMEN PELVIS FINDINGS Hepatobiliary: Liver is grossly unremarkable. No suspicious/enhancing hepatic lesions. Gallbladder is unremarkable. No intrahepatic or extrahepatic ductal dilatation. Pancreas: Within normal limits. Spleen: Within normal limits. Adrenals/Urinary Tract: Adrenal glands are within normal limits. Kidneys are within normal limits.  No hydronephrosis. Thick-walled bladder, although underdistended. Stomach/Bowel: Stomach is within normal limits. No evidence of bowel obstruction. Normal appendix (series 2/image 78). Mild sigmoid diverticulosis, without diverticulitis. Vascular/Lymphatic: No evidence of abdominal aortic aneurysm. Atherosclerotic calcifications of the abdominal aorta and branch vessels. 9 mm short axis aortocaval node (series 2/image 86), grossly unchanged. Otherwise, no suspicious abdominopelvic lymphadenopathy. Status post pelvic lymphadenectomy. Reproductive: Enlargement of the central gland, indenting the base of the bladder, suggesting BPH. Heterogeneous enhancement of the left prostate apex (series 2/image 95) and right base extending into the right seminal vesicle (series 2/image 92) in this patient with known prostate cancer. Other: No abdominopelvic ascites. Musculoskeletal: Multifocal sclerotic osseous metastases throughout the visualized axial and appendicular skeleton, grossly unchanged from priors, without pathologic fracture. IMPRESSION: Heterogeneous enhancement of the prostate is patient with known prostate cancer. Suspected multifocal pulmonary metastases, grossly unchanged from the most recent CTA chest, progressive from May 2021. 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 Moderate bilateral pleural effusions, right greater than left. 9 mm short axis aortocaval node, grossly unchanged. Multifocal sclerotic osseous metastases throughout  the visualized axial and appendicular skeleton, grossly unchanged. Electronically Signed   By: Julian Hy M.D.   On: 05/25/2020 11:44    EKG: Independently reviewed.  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 Syncope - 1. Syncope pathway 2. 2d echo 3. Tele monitor 4. Suspect that syncope secondary to respiratory difficulties associated with CA and especially worsening due to the sizable recurrent pleural effusion. 2. Malignant pleural effusions - 1. R>>L 2. IR thoracentesis in AM to try and drain that big right pleural effusion. 3. Defer to IR wether or not its time yet for pleurex catheter, effusion last drained in Sept. 3. Chronic resp failure with hypoxia - 1. Cont O2 via [place] 2. If any worsening over night check ABG and consider NIPPV (suspect chronic hypercapnic component too with the elevated bicarb chronically). 4. HTN - 1. Cont home BP meds 5. Continuous steroid use - 1. Cont BID decadron for now 2. Sounds like Dr. Alen Blew may end up tapering him off of steroids (per pt), but will defer this to Dr. Alen Blew  DVT prophylaxis: Lovenox Code Status: Full Family Communication: No family in room Disposition Plan: Home after syncope work up and thoracentesis Consults called: None Admission status: Place in obs    Halim Surrette, [place] Hospital[facility]  How to contact the [facility] Attending or Consulting provider [place] or covering provider during after hours [place], for this patient?  1. Check the care  team in [facility] and look for a) attending/consulting [place] provider listed and b) the [facility] team listed 2. Log into [email or web address]  Amion Physician Scheduling and messaging for groups and whole hospitals  On call and physician scheduling software for group practices,  residents, hospitalists and other medical providers for call, clinic, rotation and shift schedules. On[employer] is a hospital-wide system for scheduling doctors and paging doctors on call. EasyPlot is for scientific plotting and data analysis.  [email or web address]  and use [place]'s universal password to access. If you do not have the password, please contact the hospital operator.  3. Locate the [facility] provider you are looking for under [facility] and page to a number that you can be directly reached. 4. If you still have difficulty reaching the provider, please page the [facility] (Director on Call) for the Hospitalists listed on amion for assistance.  05/25/2020, 8:10 PM

## 2020-05-25 NOTE — ED Provider Notes (Signed)
[name] Mathews Provider Note   CSN: [id number] Arrival date & time: 05/25/20  1139     History Chief Complaint  Patient presents with  . Loss of Consciousness    [name] Mathews is a 74 y.o. male who presents emergency department with a chief complaint of LOC.  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  Patient presents with complaint of syncope.  He states that he was up and walking around when he started to feel "woozy."  He sat down in his chair for about 30 minutes and then began to feel worse.  He had onset of diaphoresis, blurry vision and ringing in his ears.  He states the next thing that he knows he was on the floor and his daughter was yelling at him telling that he fell.  He has a very distant history of syncope.  He is complaining of headache, neck pain and chest pressure.  He has chronic upper extremity paresthesia from his chemotherapy but denies anything new.  He denies upper extremity weakness.  He denies chest pain.  He is chronically on 2 L of oxygen and has no change in his oxygen need.        Past Medical History:  Diagnosis Date  . Hyperlipidemia   . Hypertension   . Prostate cancer ([place])    metastasis bones  . Restless leg syndrome     Patient Active Problem List   Diagnosis Date Noted  . Acute on chronic respiratory failure with hypoxia ([place]) 03/07/2020  . Pleural effusion 03/07/2020  . Multifocal pneumonia 02/28/2020  . Pain of left calf 02/28/2020  . Chronic respiratory failure with hypoxia ([place]) 02/28/2020  . Prostate cancer metastatic to lung ([place]) 02/12/2020  . Loss of appetite 02/12/2020  . Obstructive sleep apnea 09/18/2019  . Seasonal allergies 09/18/2019  . Port-A-Cath in place 03/24/2019  . Goals of care, counseling/discussion 01/05/2019  . History of cluster headache 08/12/2017  . Neuropathic pain 02/09/2017  . Routine adult health maintenance 01/08/2017  .  Essential hypertension 01/08/2017  . Hyperlipidemia 01/08/2017  . Cervical lymphadenopathy 01/08/2017  . Back pain 09/08/2014  . 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 IR CV LINE INJECTION  02/15/2019  . IR IMAGING GUIDED PORT INSERTION  01/11/2019  . IR THORACENTESIS ASP PLEURAL SPACE W/IMG GUIDE  11/25/2019  . PELVIC LYMPH NODE DISSECTION     prostate remains intact       Family History  Problem Relation Age of Onset  . Heart attack Mother 76       died of heart attack  . Cervical cancer Sister 33  . Stroke Brother 30    Social History   Tobacco Use  . Smoking status: Former Smoker    Packs/day: 0.25    Years: 6.00    Pack years: 1.50    Start date: 1969    Quit date: 1975    Years since quitting: 46.9  . Smokeless tobacco: Never Used  Vaping Use  . Vaping Use: Never used  Substance Use Topics  . Alcohol use: No  . Drug use: No    Home Medications Prior to Admission medications   Medication Sig Start Date End Date Taking? Authorizing Provider  albuterol (VENTOLIN HFA) 108 (90 Base) MCG/ACT inhaler Inhale 1-2 puffs into the lungs every 6 (six) hours as needed for wheezing or shortness of breath. 01/23/20  Olalere, Adewale A, MD  denosumab (XGEVA) 120 MG/1.7ML SOLN Inject 120 mg into the skin every 30 (thirty) days.    [provider]  dexamethasone (DECADRON) 4 MG tablet Take 4 mg twice a day. 04/24/20   [name], MD  dextromethorphan-guaiFENesin [facility] DM) 30-600 MG 12hr tablet Take 1 tablet by mouth 2 (two) times daily as needed for cough.    [provider]  diphenoxylate-atropine (LOMOTIL) 2.5-0.025 MG tablet Take 1 tablet by mouth 4 (four) times daily as needed for diarrhea or loose stools. Patient not taking: Reported on 05/23/2020 12/01/19   [name], MD  gabapentin (NEURONTIN) 300 MG capsule Take 1 capsule (300 mg total) by mouth at bedtime. 08/17/19   [name],  MD  hydrochlorothiazide (HYDRODIURIL) 25 MG tablet Take 1 tablet (25 mg total) by mouth daily. 08/22/19   Guadalupe Dawn, MD  leuprolide (LUPRON) 30 MG injection Inject 30 mg into the muscle every 4 (four) months.    [provider]  mirtazapine (REMERON) 15 MG tablet Take 1 tablet (15 mg total) by mouth at bedtime. 02/10/20   Autry-Lott, Naaman Plummer, DO  Nutritional Supplements (ENSURE ACTIVE HIGH PROTEIN) LIQD Use 3 cans daily. 01/31/20   [name], MD  Opium 10 MG/ML (1%) TINC Take 0.6 mLs (6 mg total) by mouth every 4 (four) hours as needed for diarrhea or loose stools. 12/05/19   [name], MD  OVER THE COUNTER MEDICATION Take 5 mLs by mouth daily as needed (cough). Cheritussin cough syrup    [provider]  prochlorperazine (COMPAZINE) 10 MG tablet TAKE 1 TABLET BY MOUTH EVERY 6 HOURS AS NEEDED FOR NAUSEA FOR VOMITING Patient taking differently: Take 10 mg by mouth every 6 (six) hours as needed for nausea or vomiting.  06/14/19   [name], MD  promethazine-codeine (PHENERGAN WITH CODEINE) 6.25-10 MG/5ML syrup Take 5 mLs by mouth 2 (two) times daily as needed for cough. 03/16/20   Parrett, Fonnie Mu, NP    Allergies    Bee venom, Wasp venom, and Percocet [oxycodone-acetaminophen]  Review of Systems   Review of Systems Ten systems reviewed and are negative for acute change, except as noted in the HPI.  Physical Exam Updated Vital Signs BP 120/79   Pulse 87   Temp 98.3 F (36.8 C) (Oral)   Resp (!) 24   Ht 5\' 5"  (1.651 m)   Wt 63.5 kg   SpO2 98%   BMI 23.30 kg/m   Physical Exam Vitals and nursing note reviewed.  Constitutional:      General: He is not in acute distress.    Appearance: He is well-developed. He is not diaphoretic.  HENT:     Head: Normocephalic and atraumatic.  Eyes:     General: No scleral icterus.    Conjunctiva/sclera: Conjunctivae normal.  Cardiovascular:     Rate and Rhythm: Normal rate and regular rhythm.     Heart sounds:  Normal heart sounds.  Pulmonary:     Effort: Prolonged expiration present. No respiratory distress.     Breath sounds: Wheezing present.  Abdominal:     Palpations: Abdomen is soft.     Tenderness: There is no abdominal tenderness.  Musculoskeletal:     Cervical back: Normal range of motion and neck supple.  Skin:    General: Skin is warm and dry.  Neurological:     Mental Status: He is alert.  Psychiatric:        Behavior:  Behavior normal.     ED Results / Procedures / Treatments   Labs (all labs ordered are listed, but only abnormal results are displayed) Labs Reviewed  BASIC METABOLIC PANEL  CBC  URINALYSIS, ROUTINE W REFLEX MICROSCOPIC  CBG MONITORING, ED  TROPONIN I (HIGH SENSITIVITY)    EKG None ECG interpretation   Date: 05/25/2020  Rate: 94  Rhythm: normal sinus rhythm  QRS Axis: normal  Intervals: normal  ST/T Wave abnormalities: normal  Conduction Disutrbances: none  Narrative Interpretation:   Old EKG Reviewed: No significant changes noted    Radiology CT CHEST ABDOMEN PELVIS W CONTRAST  Result Date: 05/25/2020 CLINICAL DATA:  Metastatic prostate cancer, restaging EXAM: CT CHEST, ABDOMEN, AND PELVIS WITH CONTRAST TECHNIQUE: Multidetector CT imaging of the chest, abdomen and pelvis was performed following the standard protocol during bolus administration of intravenous contrast. CONTRAST:  122mL OMNIPAQUE IOHEXOL 300 MG/ML  SOLN COMPARISON:  CTA chest dated 03/06/2020. CT chest dated 11/11/2019. CT chest abdomen pelvis dated 10/05/2019. FINDINGS: CT CHEST FINDINGS Cardiovascular: The heart is normal in size. No pericardial effusion. No evidence of thoracic aortic aneurysm. Mild atherosclerotic calcifications of the aortic arch. Three vessel coronary atherosclerosis. Right chest port terminates at the cavoatrial junction. Enlargement of the main pulmonary artery, suggesting pulmonary arterial hypertension. Mediastinum/Nodes: No suspicious mediastinal  lymphadenopathy. Visualized thyroid is unremarkable. Lungs/Pleura: Moderate bilateral pleural effusions, right greater than left. Patchy pulmonary opacities in the lungs bilaterally, predominantly subpleural/peripheral, with interstitial thickening in the lungs bilaterally. This appearance is unchanged from recent CTA chest but progressive from May 2021. 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 A more typical rounded metastasis in the medial left upper lobe measures 10 x 10 mm (series 6/image 39), previously 6 x 9 mm in may. A more atypical patchy nodular opacity in the anterior left lower lobe measures 2.6 x 1.8 cm (series 6/image 87), previously 2.3 x 1.1 cm in May. No pneumothorax. Musculoskeletal: Multifocal sclerotic osseous metastases throughout the visualized axial and appendicular skeleton, grossly unchanged from priors, without pathologic fracture. CT ABDOMEN PELVIS FINDINGS Hepatobiliary: Liver is grossly unremarkable. No suspicious/enhancing hepatic lesions. Gallbladder is unremarkable. No intrahepatic or extrahepatic ductal dilatation. Pancreas: Within normal limits. Spleen: Within normal limits. Adrenals/Urinary Tract: Adrenal glands are within normal limits. Kidneys are within normal limits.  No hydronephrosis. Thick-walled bladder, although underdistended. Stomach/Bowel: Stomach is within normal limits. No evidence of bowel obstruction. Normal appendix (series 2/image 78). Mild sigmoid diverticulosis, without diverticulitis. Vascular/Lymphatic: No evidence of abdominal aortic aneurysm. Atherosclerotic calcifications of the abdominal aorta and branch vessels. 9 mm short axis aortocaval node (series 2/image 86), grossly unchanged. Otherwise, no suspicious abdominopelvic lymphadenopathy. Status post pelvic lymphadenectomy. Reproductive: Enlargement  of the central gland, indenting the base of the bladder, suggesting BPH. Heterogeneous enhancement of the left prostate apex (series 2/image 95) and right base extending into the right seminal vesicle (series 2/image 92) in this patient with known prostate cancer. Other: No abdominopelvic ascites. Musculoskeletal: Multifocal sclerotic osseous metastases throughout the visualized axial and appendicular skeleton, grossly unchanged from priors, without pathologic fracture. IMPRESSION: Heterogeneous enhancement of the prostate is patient with known prostate cancer. Suspected multifocal pulmonary metastases, grossly unchanged from the most recent CTA chest, progressive from May 2021. 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 Moderate bilateral pleural effusions, right greater than left. 9 mm short axis aortocaval node, grossly unchanged. Multifocal sclerotic osseous metastases  throughout the visualized axial and appendicular skeleton, grossly unchanged. Electronically Signed   By: Julian Hy M.D.   On: 05/25/2020 11:44    Procedures Procedures (including critical care time)  Medications Ordered in ED Medications - No data to display  ED Course  I have reviewed the triage vital signs and the nursing notes.  Pertinent labs & imaging results that were available during my care of the patient were reviewed by me and considered in my medical decision making (see chart for details).    MDM Rules/Calculators/A&P                          CH:ENIDPOE VS:  Vitals:   05/25/20 1930 05/25/20 1945 05/25/20 2000 05/25/20 2015  BP: 123/83 116/69 (!) 143/86 (!) 142/72  Pulse: 90 91 96 94  Resp: (!) 23 (!) 29 (!) 22 (!) 25  Temp:      TempSrc:      SpO2: 96% 96% 97% 97%  Weight:      Height:        UM:PNTIRWE is gathered by patient and EMR. Previous records obtained and reviewed. DDX:The patient's complaint of syncope involves an extensive number of  diagnostic and treatment options, and is a complaint that carries with it a high risk of complications, morbidity, and potential mortality. Given the large differential diagnosis, medical decision making is of high complexity. The differential for syncope is extensive and includes, but is not limited to: arrythmia (Vtach, SVT, SSS, sinus arrest, AV block, bradycardia) aortic stenosis, AMI, HOCM, PE, atrial myxoma, pulmonary hypertension, orthostatic hypotension, (hypovolemia, drug effect, GB syndrome, micturition, cough, swall) carotid sinus sensitivity, Seizure, TIA/CVA, hypoglycemia,  Vertigo.  Labs: I ordered reviewed and interpreted labs which include CBC which shows normocytic anemia at baseline..  Shows no signs of infection, BMP without significant abnormality, troponin negative. Imaging: I ordered and reviewed images which included CT head, CT C-spine, CT angio chest PE and a 1 view chest x-ray. I independently visualized and interpreted all imaging. Significant findings include large right-sided pleural effusion, no evidence of PE. There are no acute, significant findings on the remainder of today's images. EKG:nsR AT 65   Consults: MDM: Patient here with syncopal event.  No evidence of pulmonary embolus.  Large pleural effusion.  Patient will be admitted by Dr. Alcario Drought.  Stable throughout his ER visit. Patient disposition:The patient appears reasonably stabilized for admission considering the current resources, flow, and capabilities available in the ED at this time, and I doubt any other [facility] requiring further screening and/or treatment in the ED prior to admission.        Final Clinical Impression(s) / ED Diagnoses Final diagnoses:  None    Rx / DC Orders ED Discharge Orders    None       Margarita Mail, PA-C 05/25/20 2217    Isla Pence, MD 05/25/20 2258

## 2020-05-25 NOTE — ED Triage Notes (Addendum)
Per EMS- Patient was at home, getting ready for a chemo treatment today. Patient was sitting in a chair and had LOC. Patient fell forward. Hitting his head. Patient c/o a headache and ringing in his ears. EKG-NSR.  Patient wears home O2 2L/min via [place] and was placed on O2 2L/min upon arrival to triage #3 sats-98%

## 2020-05-25 NOTE — ED Notes (Signed)
Patient has a port and wants his labs drawn when his port is accessed

## 2020-05-25 NOTE — ED Notes (Signed)
Patient stated he does not have a pacemaker

## 2020-05-25 NOTE — ED Notes (Signed)
Fall Bundle: Yellow socks Fall Risk armband Bed alarm Fall Risk sign outside of door

## 2020-05-26 ENCOUNTER — Observation Stay (HOSPITAL_BASED_OUTPATIENT_CLINIC_OR_DEPARTMENT_OTHER): Payer: Medicare Other

## 2020-05-26 DIAGNOSIS — R55 Syncope and collapse: Secondary | ICD-10-CM

## 2020-05-26 LAB — ECHOCARDIOGRAM COMPLETE
Area-P 1/2: 3.48 cm2
Height: 65 in
S' Lateral: 2.5 cm
Weight: 2240 oz

## 2020-05-26 LAB — RESP PANEL BY RT-PCR (FLU A&B, COVID) ARPGX2
Influenza A by PCR: NEGATIVE
Influenza B by PCR: NEGATIVE
SARS Coronavirus 2 by RT PCR: NEGATIVE

## 2020-05-26 LAB — CBG MONITORING, ED: Glucose-Capillary: 107 mg/dL — ABNORMAL HIGH (ref 70–99)

## 2020-05-26 NOTE — Discharge Summary (Signed)
Physician Discharge Summary  [name] Mathews WSF:[id number] DOB: [birth year] DOA: 05/25/2020  PCP: Shary Key, DO  Admit date: 05/25/2020 Discharge date: 05/26/2020  Admitted From: Home Disposition: Home  Recommendations for Outpatient Follow-up:  1. Follow up with PCP in 1-2 weeks 2. Follow-up with oncology as scheduled.  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  According to the patient, he occasionally gets these episodes when he feels lightheaded dizzy.  Yesterday he passed out after those episodes.  Denies any palpitations before the episode. Patient has chronic pleural effusions, he is short of breath but not worse or not better.  He uses 2 L oxygen at baseline.  In the emergency room skeletal survey negative.  CT chest with no PE, multifocal pulmonary metastasis disease as before.  Bilateral pleural effusion as in previous scans. Twelve-lead EKG and telemetry monitor while in the hospital were without any evidence of arrhythmias.  Patient is currently stable, asymptomatic and no orthostatic changes.  A 2D echocardiogram without any acute abnormalities.  Dizziness and syncope: Probably orthostatic.  Patient on hydrochlorothiazide that will be discontinued.  Did not have any evidence of cardiac arrhythmias.  No evidence of abnormal cardiac valves.  Recommended discontinue diuretic.  All-time fall precautions and orthostatic precautions.  Blood pressures are adequate understanding, no indication to start midodrine.  He is on chronic steroid therapy that he will  continue.  Bilateral malignant pleural effusion: Patient and family reported thoracentesis in June, September.  Since last 2 months at least he is on 2 L of oxygen and his breathing is at baseline. Patient does have lymphangitic carcinomatosis and chronic recurrent pleural effusion, thoracentesis would be reserved only for symptomatic shortness of breath, worsening symptoms, suspicion of infection.  Today, he is at about his baseline respiratory status so there is no indication to do thoracentesis.  I have explained to this patient and family, for increasing symptoms he can be arranged for thoracentesis from his oncologist office.  Patient mobilized in the hallway.  He is on 2 L oxygen and able to get around.  Orthostatics negative.  Patient is able to go home and follow-up as needed or with cancer clinic.   Discharge Diagnoses:  Principal Problem:   Syncope Active Problems:   Recurrent prostate adenocarcinoma   Essential hypertension   Prostate cancer metastatic to lung [facility])   Chronic respiratory failure with hypoxia (HCC)   Malignant pleural effusion    Discharge Instructions  Discharge Instructions    Diet general   Complete by: As directed    Discharge instructions   Complete by: As directed    All-time fall precautions. Follow-up with your cancer doctor, if you feel more short of breath, will need fluid removed from your chest and can be arranged through the oncology office. We will stop your fluid pill at that may make you dehydrated.   Increase activity slowly   Complete by: As directed      Allergies as of 05/26/2020      Reactions   Bee Venom Anaphylaxis, Shortness Of Breath, Swelling   Tongue swelling.    Wasp Venom Anaphylaxis, Shortness Of Breath, Swelling   Tongue swelling   Percocet [oxycodone-acetaminophen]  Palpitations      Medication List    STOP taking these medications   hydrochlorothiazide 25 MG tablet Commonly known as: HYDRODIURIL     TAKE these  medications   albuterol 108 (90 Base) MCG/ACT inhaler Commonly known as: VENTOLIN HFA Inhale 1-2 puffs into the lungs every 6 (six) hours as needed for wheezing or shortness of breath.   dexamethasone 4 MG tablet Commonly known as: DECADRON Take 4 mg twice a day. What changed:   how much to take  how to take this  when to take this  additional instructions   dextromethorphan-guaiFENesin 30-600 MG 12hr tablet Commonly known as: MUCINEX DM Take 1 tablet by mouth 2 (two) times daily as needed for cough.   Ensure Active High Protein Liqd Use 3 cans daily. What changed:   how much to take  how to take this  when to take this  additional instructions   gabapentin 300 MG capsule Commonly known as: NEURONTIN Take 1 capsule (300 mg total) by mouth at bedtime.   IMODIUM PO Take 2 capsules by mouth in the morning and at bedtime.   leuprolide 30 MG injection Commonly known as: LUPRON Inject 30 mg into the muscle every 4 (four) months.   mirtazapine 15 MG tablet Commonly known as: REMERON Take 1 tablet (15 mg total) by mouth at bedtime.   OVER THE COUNTER MEDICATION Take 5 mLs by mouth daily as needed (cough). Cheritussin cough syrup   prochlorperazine 10 MG tablet Commonly known as: COMPAZINE TAKE 1 TABLET BY MOUTH EVERY 6 HOURS AS NEEDED FOR NAUSEA FOR VOMITING What changed:   how much to take  how to take this  when to take this  reasons to take this  additional instructions   Xgeva 120 MG/1.7ML Soln injection Generic drug: denosumab Inject 120 mg into the skin every 30 (thirty) days.       Follow-up Information    Shary Key, DO Follow up.   Specialty: Family Medicine Contact information: [address] [place] [address] [phone]              Allergies  Allergen Reactions  . Bee Venom Anaphylaxis, Shortness Of Breath and Swelling    Tongue swelling.   . Wasp Venom Anaphylaxis, Shortness Of Breath and Swelling    Tongue  swelling   . Percocet [Oxycodone-Acetaminophen] Palpitations    Consultations:  None   Procedures/Studies: DG Chest 1 View  Result Date: 05/25/2020 CLINICAL DATA:  Syncope, metastatic prostate cancer EXAM: CHEST  1 VIEW COMPARISON:  05/24/2020, 03/16/2020 FINDINGS: Single frontal view of the chest demonstrates stable right chest wall port. Multifocal bilateral areas of lung consolidation or consistent with known pulmonary metastases. Small bilateral pleural effusions are unchanged. No pneumothorax. Sclerotic lesions within the thoracic cage bilateral humeri are consistent with known metastases. IMPRESSION: 1. Stable metastatic prostate cancer, with bony and pulmonary metastases as above. 2. Stable bilateral pleural effusions. Electronically Signed   By: Randa Ngo M.D.   On: 05/25/2020 16:02   CT Head Wo Contrast  Result Date: 05/25/2020 CLINICAL DATA:  74 year old male with neck trauma. EXAM: CT HEAD WITHOUT CONTRAST CT CERVICAL SPINE WITHOUT CONTRAST TECHNIQUE: Multidetector CT imaging of the head and cervical spine was performed following the standard protocol without intravenous contrast. Multiplanar CT image reconstructions of the cervical spine were also generated. COMPARISON:  Head CT dated 10/05/2019. FINDINGS: CT HEAD FINDINGS Brain: The ventricles and sulci appropriate size for patient's age. The gray-white matter  discrimination is preserved. There is no acute intracranial hemorrhage. No mass effect or midline shift no extra-axial fluid collection. Vascular: No hyperdense vessel or unexpected calcification. Skull: Normal. Negative for fracture or focal lesion. Sinuses/Orbits: Mild mucoperiosteal thickening of paranasal sinuses with opacification of the right sphenoid sinus. No air-fluid level. The mastoid air cells are clear. Other: None CT CERVICAL SPINE FINDINGS Alignment: No acute subluxation. Skull base and vertebrae: There is no acute fracture. Extensive sclerotic metastatic  disease throughout the cervical spine. Soft tissues and spinal canal: No prevertebral fluid or swelling. No visible canal hematoma. Disc levels: Multilevel degenerative changes with endplate irregularity and disc space narrowing. Upper chest: Partially visualized small bilateral pleural effusions. Background of emphysema and diffuse interstitial coarsening. Other: Partially visualized right-sided Port-A-Cath. IMPRESSION: 1. No acute intracranial pathology. 2. No acute/traumatic cervical spine pathology. 3. Extensive sclerotic metastatic disease throughout the cervical spine and keeping with known prostate cancer. 4. Partially visualized small bilateral pleural effusions. 5. Emphysema (ICD10-J43.9). Electronically Signed   By: Anner Crete M.D.   On: 05/25/2020 18:17   CT Angio Chest PE W/Cm &/Or Wo Cm  Result Date: 05/25/2020 CLINICAL DATA:  Loss of consciousness, metastatic prostate cancer, headache EXAM: CT ANGIOGRAPHY CHEST WITH CONTRAST TECHNIQUE: Multidetector CT imaging of the chest was performed using the standard protocol during bolus administration of intravenous contrast. Multiplanar CT image reconstructions and MIPs were obtained to evaluate the vascular anatomy. CONTRAST:  28mL OMNIPAQUE IOHEXOL 350 MG/ML SOLN COMPARISON:  05/24/2020 FINDINGS: Cardiovascular: This is a technically adequate evaluation of the pulmonary vasculature. No filling defects or pulmonary emboli. The heart is unremarkable without pericardial effusion. Thoracic aorta is stable with mild atherosclerosis. Mediastinum/Nodes: No enlarged mediastinal, hilar, or axillary lymph nodes. Thyroid gland, trachea, and esophagus demonstrate no significant findings. Lungs/Pleura: Multifocal bilateral consolidation and multiple pulmonary nodules consistent with metastatic disease, unchanged since staging CT performed yesterday. Stable bilateral pleural effusions. No pneumothorax. Upper Abdomen: High attenuation material within the  gallbladder likely reflects vicarious excretion of previously administered contrast. No acute upper abdominal finding. Musculoskeletal: Extensive multifocal bony metastases are unchanged since staging CT. No acute fractures. Reconstructed images demonstrate no additional findings. Review of the MIP images confirms the above findings. IMPRESSION: 1. No evidence of pulmonary embolus. 2. Stable metastatic prostate cancer with multifocal areas of lung consolidation, pulmonary nodules, and diffuse sclerotic bony metastases. 3. Stable pleural effusions. Electronically Signed   By: Randa Ngo M.D.   On: 05/25/2020 18:12   CT Cervical Spine Wo Contrast  Result Date: 05/25/2020 CLINICAL DATA:  74 year old male with neck trauma. EXAM: CT HEAD WITHOUT CONTRAST CT CERVICAL SPINE WITHOUT CONTRAST TECHNIQUE: Multidetector CT imaging of the head and cervical spine was performed following the standard protocol without intravenous contrast. Multiplanar CT image reconstructions of the cervical spine were also generated. COMPARISON:  Head CT dated 10/05/2019. FINDINGS: CT HEAD FINDINGS Brain: The ventricles and sulci appropriate size for patient's age. The gray-white matter discrimination is preserved. There is no acute intracranial hemorrhage. No mass effect or midline shift no extra-axial fluid collection. Vascular: No hyperdense vessel or unexpected calcification. Skull: Normal. Negative for fracture or focal lesion. Sinuses/Orbits: Mild mucoperiosteal thickening of paranasal sinuses with opacification of the right sphenoid sinus. No air-fluid level. The mastoid air cells are clear. Other: None CT CERVICAL SPINE FINDINGS Alignment: No acute subluxation. Skull base and vertebrae: There is no acute fracture. Extensive sclerotic metastatic disease throughout the cervical spine. Soft tissues and spinal canal: No prevertebral fluid or swelling. No visible  canal hematoma. Disc levels: Multilevel degenerative changes with endplate  irregularity and disc space narrowing. Upper chest: Partially visualized small bilateral pleural effusions. Background of emphysema and diffuse interstitial coarsening. Other: Partially visualized right-sided Port-A-Cath. IMPRESSION: 1. No acute intracranial pathology. 2. No acute/traumatic cervical spine pathology. 3. Extensive sclerotic metastatic disease throughout the cervical spine and keeping with known prostate cancer. 4. Partially visualized small bilateral pleural effusions. 5. Emphysema (ICD10-J43.9). Electronically Signed   By: Anner Crete M.D.   On: 05/25/2020 18:17   CT CHEST ABDOMEN PELVIS W CONTRAST  Result Date: 05/25/2020 CLINICAL DATA:  Metastatic prostate cancer, restaging EXAM: CT CHEST, ABDOMEN, AND PELVIS WITH CONTRAST TECHNIQUE: Multidetector CT imaging of the chest, abdomen and pelvis was performed following the standard protocol during bolus administration of intravenous contrast. CONTRAST:  16mL OMNIPAQUE IOHEXOL 300 MG/ML  SOLN COMPARISON:  CTA chest dated 03/06/2020. CT chest dated 11/11/2019. CT chest abdomen pelvis dated 10/05/2019. FINDINGS: CT CHEST FINDINGS Cardiovascular: The heart is normal in size. No pericardial effusion. No evidence of thoracic aortic aneurysm. Mild atherosclerotic calcifications of the aortic arch. Three vessel coronary atherosclerosis. Right chest port terminates at the cavoatrial junction. Enlargement of the main pulmonary artery, suggesting pulmonary arterial hypertension. Mediastinum/Nodes: No suspicious mediastinal lymphadenopathy. Visualized thyroid is unremarkable. Lungs/Pleura: Moderate bilateral pleural effusions, right greater than left. Patchy pulmonary opacities in the lungs bilaterally, predominantly subpleural/peripheral, with interstitial thickening in the lungs bilaterally. This appearance is unchanged from recent CTA chest but progressive from May 2021. Despite the consolidative appearance, this favors parenchymal metastases over  infection/inflammation, with superimposed lymphangitic carcinomatosis in the right middle lobe and bilateral lower lobes. A more typical rounded metastasis in the medial left upper lobe measures 10 x 10 mm (series 6/image 39), previously 6 x 9 mm in may. A more atypical patchy nodular opacity in the anterior left lower lobe measures 2.6 x 1.8 cm (series 6/image 87), previously 2.3 x 1.1 cm in May. No pneumothorax. Musculoskeletal: Multifocal sclerotic osseous metastases throughout the visualized axial and appendicular skeleton, grossly unchanged from priors, without pathologic fracture. CT ABDOMEN PELVIS FINDINGS Hepatobiliary: Liver is grossly unremarkable. No suspicious/enhancing hepatic lesions. Gallbladder is unremarkable. No intrahepatic or extrahepatic ductal dilatation. Pancreas: Within normal limits. Spleen: Within normal limits. Adrenals/Urinary Tract: Adrenal glands are within normal limits. Kidneys are within normal limits.  No hydronephrosis. Thick-walled bladder, although underdistended. Stomach/Bowel: Stomach is within normal limits. No evidence of bowel obstruction. Normal appendix (series 2/image 78). Mild sigmoid diverticulosis, without diverticulitis. Vascular/Lymphatic: No evidence of abdominal aortic aneurysm. Atherosclerotic calcifications of the abdominal aorta and branch vessels. 9 mm short axis aortocaval node (series 2/image 86), grossly unchanged. Otherwise, no suspicious abdominopelvic lymphadenopathy. Status post pelvic lymphadenectomy. Reproductive: Enlargement of the central gland, indenting the base of the bladder, suggesting BPH. Heterogeneous enhancement of the left prostate apex (series 2/image 95) and right base extending into the right seminal vesicle (series 2/image 92) in this patient with known prostate cancer. Other: No abdominopelvic ascites. Musculoskeletal: Multifocal sclerotic osseous metastases throughout the visualized axial and appendicular skeleton, grossly unchanged  from priors, without pathologic fracture. IMPRESSION: Heterogeneous enhancement of the prostate is patient with known prostate cancer. Suspected multifocal pulmonary metastases, grossly unchanged from the most recent CTA chest, progressive from May 2021. 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 Moderate bilateral pleural effusions, right greater than left. 9 mm short axis aortocaval node, grossly unchanged. Multifocal sclerotic osseous metastases throughout the visualized axial and appendicular skeleton, grossly unchanged. Electronically Signed  By: Julian Hy M.D.   On: 05/25/2020 11:44   ECHOCARDIOGRAM COMPLETE  Result Date: 05/26/2020    ECHOCARDIOGRAM REPORT   Patient Name:   [name] Mathews [facility] Date of Exam: 05/26/2020 Medical Rec #:  [id number]          Height:       65.0 in Accession #:    [id number]         Weight:       140.0 lb Date of Birth:  [birth year]          BSA:          1.700 m Patient Age:    71 years           BP:           145/99 mmHg Patient Gender: M                  HR:           92 bpm. Exam Location:  Inpatient Procedure: 2D Echo Indications:    syncope  History:        Patient has no prior history of Echocardiogram examinations.                 Risk Factors:Dyslipidemia, Hypertension and Former Smoker.  Sonographer:    Jannett Celestine RDCS (AE) Referring Phys: [place]  1. Left ventricular ejection fraction, by estimation, is 60 to 65%. The left ventricle has normal function. The left ventricle has no regional wall motion abnormalities. Left ventricular diastolic parameters were normal.  2. Right ventricular systolic function is normal. The right ventricular size is normal. Tricuspid regurgitation signal is inadequate for assessing PA pressure.  3. The mitral valve is normal in structure. Trivial mitral valve regurgitation. No evidence of mitral stenosis.  4. The aortic valve is tricuspid. Aortic valve  regurgitation is trivial. No aortic stenosis is present.  5. Aortic ascending aorta appears mildly enlarged, but appears to be diagonal cross section, consider alternative imaging.  6. The inferior vena cava is normal in size with greater than 50% respiratory variability, suggesting right atrial pressure of 3 mmHg. Comparison(s): No prior Echocardiogram. Conclusion(s)/Recommendation(s): Normal biventricular function without evidence of hemodynamically significant valvular heart disease. FINDINGS  Left Ventricle: Left ventricular ejection fraction, by estimation, is 60 to 65%. The left ventricle has normal function. The left ventricle has no regional wall motion abnormalities. The left ventricular internal cavity size was normal in size. There is  no left ventricular hypertrophy. Left ventricular diastolic parameters were normal. Right Ventricle: The right ventricular size is normal. No increase in right ventricular wall thickness. Right ventricular systolic function is normal. Tricuspid regurgitation signal is inadequate for assessing PA pressure. Left Atrium: Left atrial size was normal in size. Right Atrium: Right atrial size was normal in size. Pericardium: There is no evidence of pericardial effusion. Mitral Valve: The mitral valve is normal in structure. Trivial mitral valve regurgitation. No evidence of mitral valve stenosis. Tricuspid Valve: The tricuspid valve is normal in structure. Tricuspid valve regurgitation is trivial. No evidence of tricuspid stenosis. Aortic Valve: The aortic valve is tricuspid. Aortic valve regurgitation is trivial. No aortic stenosis is present. Pulmonic Valve: The pulmonic valve was grossly normal. Pulmonic valve regurgitation is not visualized. No evidence of pulmonic stenosis. Aorta: The aortic root is normal in size and structure and ascending aorta appears mildly enlarged, but appears to be diagonal cross section, consider alternative imaging. Venous: The inferior vena cava  is  normal in size with greater than 50% respiratory variability, suggesting right atrial pressure of 3 mmHg. IAS/Shunts: The atrial septum is grossly normal.  LEFT VENTRICLE PLAX 2D LVIDd:         3.70 cm  Diastology LVIDs:         2.50 cm  LV e' medial:    3.59 cm/s LV PW:         0.70 cm  LV E/e' medial:  11.7 LV IVS:        0.60 cm  LV e' lateral:   5.55 cm/s LVOT diam:     2.00 cm  LV E/e' lateral: 7.6 LV SV:         59 LV SV Index:   35 LVOT Area:     3.14 cm  RIGHT VENTRICLE RV S prime:     15.10 cm/s TAPSE (M-mode): 1.0 cm LEFT ATRIUM             Index LA diam:        2.40 cm 1.41 cm/m LA Vol (A2C):   20.4 ml 12.00 ml/m LA Vol (A4C):   24.2 ml 14.24 ml/m LA Biplane Vol: 22.2 ml 13.06 ml/m  AORTIC VALVE LVOT Vmax:   112.00 cm/s LVOT Vmean:  73.000 cm/s LVOT VTI:    0.187 m  AORTA Ao Root diam: 2.80 cm MITRAL VALVE MV Area (PHT): 3.48 cm    SHUNTS MV Decel Time: 218 msec    Systemic VTI:  0.19 m MV E velocity: 42.00 cm/s  Systemic Diam: 2.00 cm MV A velocity: 99.80 cm/s MV E/A ratio:  0.42 Buford Dresser MD Electronically signed by Buford Dresser MD Signature Date/Time: 05/26/2020/2:34:28 PM    Final     (Echo, Carotid, EGD, Colonoscopy, ERCP)    Subjective: Patient seen and examined.  He was in the ER.  His daughter was on the phone.  Discussed about findings, we discussed about the orthostatic precautions. I also called and discussed with her daughter about why thoracentesis is not urgent and may not benefit without worsening symptoms.   Discharge Exam: Vitals:   05/26/20 1422 05/26/20 1508  BP: 135/82 133/88  Pulse: 88 95  Resp: (!) 21 (!) 21  Temp:    SpO2: 93% 91%   Vitals:   05/26/20 1202 05/26/20 1347 05/26/20 1422 05/26/20 1508  BP: 116/79 130/84 135/82 133/88  Pulse: 87 84 88 95  Resp: (!) 22 (!) 22 (!) 21 (!) 21  Temp:      TempSrc:      SpO2: 92% 96% 93% 91%  Weight:      Height:        General: Pt is alert, awake, not in acute distress Chronically sick  looking on 2 L oxygen. Cardiovascular: RRR, S1/S2 +, no rubs, no gallops Respiratory: Bilateral poor air entry.  No added sounds. Has a port on his right chest. Abdominal: Soft, NT, ND, bowel sounds + Extremities: no edema, no cyanosis    The results of significant diagnostics from this hospitalization (including imaging, microbiology, ancillary and laboratory) are listed below for reference.     Microbiology: Recent Results (from the past 240 hour(s))  Resp Panel by RT-PCR (Flu A&B, Covid) Nasopharyngeal Swab     Status: None   Collection Time: 05/26/20  9:10 AM   Specimen: Nasopharyngeal Swab; Nasopharyngeal(NP) swabs in vial transport medium  Result Value Ref Range Status   SARS Coronavirus 2 by RT PCR NEGATIVE NEGATIVE Final  Comment: (NOTE) SARS-CoV-2 target nucleic acids are NOT DETECTED.  The SARS-CoV-2 RNA is generally detectable in upper respiratory specimens during the acute phase of infection. The lowest concentration of SARS-CoV-2 viral copies this assay can detect is 138 copies/mL. A negative result does not preclude SARS-Cov-2 infection and should not be used as the sole basis for treatment or other patient management decisions. A negative result may occur with  improper specimen collection/handling, submission of specimen other than nasopharyngeal swab, presence of viral mutation(s) within the areas targeted by this assay, and inadequate number of viral copies(<138 copies/mL). A negative result must be combined with clinical observations, patient history, and epidemiological information. The expected result is Negative.  Fact Sheet for Patients:  EntrepreneurPulse.com.au  Fact Sheet for Healthcare Providers:  IncredibleEmployment.be  This test is no t yet approved or cleared by the [place] FDA and  has been authorized for detection and/or diagnosis of SARS-CoV-2 by FDA under an Emergency Use Authorization (EUA). This  EUA will remain  in effect (meaning this test can be used) for the duration of the COVID-19 declaration under Section 564(b)(1) of the Act, 21 U.S.C.section 360bbb-3(b)(1), unless the authorization is terminated  or revoked sooner.       Influenza A by PCR NEGATIVE NEGATIVE Final   Influenza B by PCR NEGATIVE NEGATIVE Final    Comment: (NOTE) The Xpert Xpress SARS-CoV-2/FLU/RSV plus assay is intended as an aid in the diagnosis of influenza from Nasopharyngeal swab specimens and should not be used as a sole basis for treatment. Nasal washings and aspirates are unacceptable for Xpert Xpress SARS-CoV-2/FLU/RSV testing.  Fact Sheet for Patients: EntrepreneurPulse.com.au  Fact Sheet for Healthcare Providers: IncredibleEmployment.be  This test is not yet approved or cleared by the [place] FDA and has been authorized for detection and/or diagnosis of SARS-CoV-2 by FDA under an Emergency Use Authorization (EUA). This EUA will remain in effect (meaning this test can be used) for the duration of the COVID-19 declaration under Section 564(b)(1) of the Act, 21 U.S.C. section 360bbb-3(b)(1), unless the authorization is terminated or revoked.  Performed at [facility], [employer] [address] [place], [place] [address]      Labs: BNP (last 3 results) Recent Labs    03/07/20 1210  BNP 39.7   Basic Metabolic Panel: Recent Labs  Lab 05/23/20 0817 05/25/20 1600  NA 142 140  K 4.0 3.6  CL 101 101  CO2 35* 32  GLUCOSE 102* 85  BUN 13 12  CREATININE 0.71 0.60*  CALCIUM 9.2 7.8*   Liver Function Tests: Recent Labs  Lab 05/23/20 0817  AST 19  ALT 6  ALKPHOS 95  BILITOT 0.2*  PROT 6.6  ALBUMIN 2.4*   No results for input(s): LIPASE, AMYLASE in the last 168 hours. No results for input(s): AMMONIA in the last 168 hours. CBC: Recent Labs  Lab 05/23/20 0817 05/25/20 1600  WBC 8.4 6.0  NEUTROABS 6.8  --   HGB 10.4*  10.0*  HCT 35.3* 34.4*  MCV 86.3 88.9  PLT 302 270   Cardiac Enzymes: No results for input(s): CKTOTAL, CKMB, CKMBINDEX, TROPONINI in the last 168 hours. BNP: Invalid input(s): POCBNP CBG: Recent Labs  Lab 05/25/20 1506 05/26/20 0519  GLUCAP 78 107*   D-Dimer No results for input(s): DDIMER in the last 72 hours. Hgb A1c No results for input(s): HGBA1C in the last 72 hours. Lipid Profile No results for input(s): CHOL, HDL, LDLCALC, TRIG, CHOLHDL, LDLDIRECT in the last 72 hours. Thyroid  function studies No results for input(s): TSH, T4TOTAL, T3FREE, THYROIDAB in the last 72 hours.  Invalid input(s): FREET3 Anemia work up No results for input(s): VITAMINB12, FOLATE, FERRITIN, TIBC, IRON, RETICCTPCT in the last 72 hours. Urinalysis    Component Value Date/Time   COLORURINE YELLOW 05/25/2020 1522   APPEARANCEUR CLEAR 05/25/2020 1522   LABSPEC 1.024 05/25/2020 1522   PHURINE 7.0 05/25/2020 1522   GLUCOSEU NEGATIVE 05/25/2020 1522   HGBUR NEGATIVE 05/25/2020 1522   BILIRUBINUR NEGATIVE 05/25/2020 1522   [place] 05/25/2020 1522   PROTEINUR 30 (A) 05/25/2020 1522   NITRITE NEGATIVE 05/25/2020 1522   LEUKOCYTESUR NEGATIVE 05/25/2020 1522   Sepsis Labs Invalid input(s): PROCALCITONIN,  WBC,  LACTICIDVEN Microbiology Recent Results (from the past 240 hour(s))  Resp Panel by RT-PCR (Flu A&B, Covid) Nasopharyngeal Swab     Status: None   Collection Time: 05/26/20  9:10 AM   Specimen: Nasopharyngeal Swab; Nasopharyngeal(NP) swabs in vial transport medium  Result Value Ref Range Status   SARS Coronavirus 2 by RT PCR NEGATIVE NEGATIVE Final    Comment: (NOTE) SARS-CoV-2 target nucleic acids are NOT DETECTED.  The SARS-CoV-2 RNA is generally detectable in upper respiratory specimens during the acute phase of infection. The lowest concentration of SARS-CoV-2 viral copies this assay can detect is 138 copies/mL. A negative result does not preclude SARS-Cov-2 infection  and should not be used as the sole basis for treatment or other patient management decisions. A negative result may occur with  improper specimen collection/handling, submission of specimen other than nasopharyngeal swab, presence of viral mutation(s) within the areas targeted by this assay, and inadequate number of viral copies(<138 copies/mL). A negative result must be combined with clinical observations, patient history, and epidemiological information. The expected result is Negative.  Fact Sheet for Patients:  EntrepreneurPulse.com.au  Fact Sheet for Healthcare Providers:  IncredibleEmployment.be  This test is no t yet approved or cleared by the [place] FDA and  has been authorized for detection and/or diagnosis of SARS-CoV-2 by FDA under an Emergency Use Authorization (EUA). This EUA will remain  in effect (meaning this test can be used) for the duration of the COVID-19 declaration under Section 564(b)(1) of the Act, 21 U.S.C.section 360bbb-3(b)(1), unless the authorization is terminated  or revoked sooner.       Influenza A by PCR NEGATIVE NEGATIVE Final   Influenza B by PCR NEGATIVE NEGATIVE Final    Comment: (NOTE) The Xpert Xpress SARS-CoV-2/FLU/RSV plus assay is intended as an aid in the diagnosis of influenza from Nasopharyngeal swab specimens and should not be used as a sole basis for treatment. Nasal washings and aspirates are unacceptable for Xpert Xpress SARS-CoV-2/FLU/RSV testing.  Fact Sheet for Patients: EntrepreneurPulse.com.au  Fact Sheet for Healthcare Providers: IncredibleEmployment.be  This test is not yet approved or cleared by the [place] FDA and has been authorized for detection and/or diagnosis of SARS-CoV-2 by FDA under an Emergency Use Authorization (EUA). This EUA will remain in effect (meaning this test can be used) for the duration of the COVID-19 declaration  under Section 564(b)(1) of the Act, 21 U.S.C. section 360bbb-3(b)(1), unless the authorization is terminated or revoked.  Performed at [facility], [employer] [address], [place],  [address]      Time coordinating discharge: 35 minutes  SIGNED:   Barb Merino, MD  [facility] 05/26/2020, 3:19 PM

## 2020-05-26 NOTE — ED Notes (Signed)
Pt discharged from this ED in stable condition at this time. All discharge instructions and follow up care reviewed with pt with no further questions at this time. Pt ambulatory to baseline, clear speech.   

## 2020-05-26 NOTE — Progress Notes (Signed)
  Echocardiogram 2D Echocardiogram has been performed.  [name] Mathews 05/26/2020, 9:19 AM

## 2020-05-26 NOTE — ED Notes (Signed)
5:21 am ( blood sugar check 107):

## 2020-05-26 NOTE — ED Notes (Signed)
Echo at bedside

## 2020-05-28 ENCOUNTER — Telehealth: Payer: Self-pay | Admitting: Pulmonary Disease

## 2020-05-28 NOTE — Telephone Encounter (Signed)
05/28/2020  Patient contacted our office reporting that he was seen in the emergency room and they mention that he had pleural effusions.  This is a recurrent finding.  This has been evaluated and discussed at previous office visits.  Patient has known prostate cancer.  He is wondering what other interventions could be performed to help with the management of the pleural effusions.  He does not feel that his breathing has worsened since last being seen on 03/30/2020.  He would like to discuss other intervention strategies with Dr. Jenetta Downer.  Plan: Patient scheduled on 05/29/2020 at 1:45 PM with Dr. Jenetta Downer  Nothing further needed.  Will route to Dr. Jenetta Downer as Juluis Rainier.    Wyn Quaker, FNP

## 2020-05-29 ENCOUNTER — Ambulatory Visit: Payer: Medicare Other | Admitting: Pulmonary Disease

## 2020-05-29 ENCOUNTER — Other Ambulatory Visit: Payer: Self-pay

## 2020-05-29 ENCOUNTER — Encounter: Payer: Self-pay | Admitting: Pulmonary Disease

## 2020-05-29 VITALS — BP 124/80 | HR 86 | Temp 97.6°F | Ht 65.0 in | Wt 135.6 lb

## 2020-05-29 DIAGNOSIS — J90 Pleural effusion, not elsewhere classified: Secondary | ICD-10-CM

## 2020-05-29 DIAGNOSIS — C7800 Secondary malignant neoplasm of unspecified lung: Secondary | ICD-10-CM

## 2020-05-29 DIAGNOSIS — R0602 Shortness of breath: Secondary | ICD-10-CM | POA: Diagnosis not present

## 2020-05-29 DIAGNOSIS — C61 Malignant neoplasm of prostate: Secondary | ICD-10-CM

## 2020-05-29 NOTE — Progress Notes (Signed)
[name] Mathews    [id number]    05-18-1946  Primary Care Physician:Paige, Weldon Picking, DO  Referring Physician: Shary Key, DO [place],  [place] [address]  Chief complaint:   Shortness of breath Concerned about developing an infection in the fluid  HPI:  Has been doing relatively well Symptoms have been about the same  Was recently hospitalized He had conflicting information about draining the fluid on leaving the fluid alone  Breathing feels about the same Activity tolerance remains about the same  He does get exercise desaturations depending on his pace He continues to use oxygen with activity  Follows up with oncology for metastatic prostate cancer Occasional cough, not bringing up any secretions  History of metastatic prostate cancer with lymphangitic spread, bilateral pleural effusions  Remains short of breath with activity  Remote smoking history quit in 1972 less than half a pack a day for less than 5 to 7 years  Uses inhalers, medications to help coughing  Outpatient Encounter Medications as of 05/29/2020  Medication Sig  . albuterol (VENTOLIN HFA) 108 (90 Base) MCG/ACT inhaler Inhale 1-2 puffs into the lungs every 6 (six) hours as needed for wheezing or shortness of breath.  . denosumab (XGEVA) 120 MG/1.7ML SOLN Inject 120 mg into the skin every 30 (thirty) days.  [name] dexamethasone (DECADRON) 4 MG tablet Take 4 mg twice a day. (Patient taking differently: Take 4 mg by mouth 2 (two) times daily. )  . dextromethorphan-guaiFENesin (MUCINEX DM) 30-600 MG 12hr tablet Take 1 tablet by mouth 2 (two) times daily as needed for cough.  . gabapentin (NEURONTIN) 300 MG capsule Take 1 capsule (300 mg total) by mouth at bedtime.  [name] leuprolide (LUPRON) 30 MG injection Inject 30 mg into the muscle every 4 (four) months.  . Loperamide HCl (IMODIUM PO) Take 2 capsules by mouth in the morning and at bedtime.  . mirtazapine (REMERON) 15 MG tablet  Take 1 tablet (15 mg total) by mouth at bedtime.  . Nutritional Supplements (ENSURE ACTIVE HIGH PROTEIN) LIQD Use 3 cans daily. (Patient taking differently: Take 1 Can by mouth in the morning and at bedtime. [name])  . OVER THE COUNTER MEDICATION Take 5 mLs by mouth daily as needed (cough). Cheritussin cough syrup  . prochlorperazine (COMPAZINE) 10 MG tablet TAKE 1 TABLET BY MOUTH EVERY 6 HOURS AS NEEDED FOR NAUSEA FOR VOMITING (Patient taking differently: Take 10 mg by mouth every 6 (six) hours as needed for nausea or vomiting. )   Facility-Administered Encounter Medications as of 05/29/2020  Medication  . sodium chloride flush (NS) 0.9 % injection 10 mL    Allergies as of 05/29/2020 - Review Complete 05/29/2020  Allergen Reaction Noted  . Bee venom Anaphylaxis, Shortness Of Breath, and Swelling 01/04/2016  . Wasp venom Anaphylaxis, Shortness Of Breath, and Swelling 01/04/2016  . Percocet [oxycodone-acetaminophen] Palpitations 11/04/2013    Past Medical History:  Diagnosis Date  . Hyperlipidemia   . Hypertension   . Prostate cancer ([place])    metastasis bones  . Restless leg syndrome     Past Surgical History:  Procedure Laterality Date  . IR CV LINE INJECTION  02/15/2019  . IR IMAGING GUIDED PORT INSERTION  01/11/2019  . IR THORACENTESIS ASP PLEURAL SPACE W/IMG GUIDE  11/25/2019  . PELVIC LYMPH NODE DISSECTION     prostate remains intact    Family History  Problem Relation Age of Onset  . Heart attack  Mother 53       died of heart attack  . Cervical cancer Sister 78  . Stroke Brother 78    Social History   Socioeconomic History  . Marital status: Widowed    Spouse name: Not on file  . Number of children: Not on file  . Years of education: Not on file  . Highest education level: Not on file  Occupational History  . Occupation: retired Chemical engineer: [employer]    Comment: retired  Tobacco Use  . Smoking status: Former Smoker    Packs/day: 0.25    Years:  6.00    Pack years: 1.50    Start date: 1969    Quit date: 1975    Years since quitting: 46.9  . Smokeless tobacco: Never Used  Vaping Use  . Vaping Use: Never used  Substance and Sexual Activity  . Alcohol use: No  . Drug use: No  . Sexual activity: Never  Other Topics Concern  . Not on file  Social History Narrative   Walks about 0.5 a day. Play a lot of golf. Widowed. Surrounded by his children and grandchildren. Has a Careers adviser, Felix Ahmadi.   Social Determinants of Health   Financial Resource Strain:   . Difficulty of Paying Living Expenses: Not on file  Food Insecurity:   . Worried About Charity fundraiser in the Last Year: Not on file  . Ran Out of Food in the Last Year: Not on file  Transportation Needs:   . Lack of Transportation (Medical): Not on file  . Lack of Transportation (Non-Medical): Not on file  Physical Activity:   . Days of Exercise per Week: Not on file  . Minutes of Exercise per Session: Not on file  Stress:   . Feeling of Stress : Not on file  Social Connections:   . Frequency of Communication with Friends and Family: Not on file  . Frequency of Social Gatherings with Friends and Family: Not on file  . Attends Religious Services: Not on file  . Active Member of Clubs or Organizations: Not on file  . Attends Archivist Meetings: Not on file  . Marital Status: Not on file  Intimate Partner Violence:   . Fear of Current or Ex-Partner: Not on file  . Emotionally Abused: Not on file  . Physically Abused: Not on file  . Sexually Abused: Not on file    Review of Systems  Constitutional: Negative for fatigue and fever.  Respiratory: Positive for shortness of breath. Negative for cough.   Cardiovascular: Negative for chest pain and leg swelling.  Gastrointestinal: Negative.     Vitals:   05/29/20 1345  BP: 124/80  Pulse: 86  Temp: 97.6 F (36.4 C)  SpO2: 98%     Physical Exam Constitutional:      Appearance: He is  well-developed.  Eyes:     General:        Right eye: No discharge.        Left eye: No discharge.     Pupils: Pupils are equal, round, and reactive to light.  Neck:     Thyroid: No thyromegaly.     Trachea: No tracheal deviation.  Cardiovascular:     Rate and Rhythm: Normal rate and regular rhythm.  Pulmonary:     Effort: No respiratory distress.     Breath sounds: No stridor. No wheezing or rhonchi.     Comments: Decreased air entry bibasilarly  Musculoskeletal:     Cervical back: No rigidity or tenderness.  Neurological:     Mental Status: He is alert.  Psychiatric:        Mood and Affect: Mood normal.    Data Reviewed: Recent chest x-ray reviewed showing no pneumothorax  CT scan of the chest reviewed compared with previous showing bilateral pleural effusions, worse on the right Amount of fluid is notably better than last CT about 2 months ago, appears to still be extensive lymphangitic spread  Assessment:  Metastatic prostate cancer Lymphangitic spread of cancer  Bilateral pleural effusions worse on the right, CT finding better than previous  Shortness of breath  Continue oxygen supplementation -May adjust supplementation level as needed  Bronchodilators as needed  May stay off oxygen as long as able to maintain saturations greater than 90  Plan/Recommendations: Continue lines of care  Graded exercises as tolerated  Follow-up in 3 months Call with significant concerns May need thoracentesis if increased fluid collection  Pleurx catheter will not be of benefit at present, may be needed in the future if significant fluid accumulation with symptoms  Sherrilyn Rist MD [facility] 05/29/2020, 2:45 PM  CC: Shary Key, DO

## 2020-05-29 NOTE — Patient Instructions (Signed)
Graded exercises as tolerated  You may adjust your oxygen to keep saturations above 90% when you are doing certain activities, always remember to bring it down to where he was before  Call if any significant changes in activity level associated with shortness of breath  The amount of fluid on your lungs currently is much less than what had been there before, I do not believe you need any drainage at present but this may change--what you may notices decreased activity tolerance, more shortness of breath  We will see you in 3 months

## 2020-06-06 ENCOUNTER — Other Ambulatory Visit: Payer: Self-pay | Admitting: Family Medicine

## 2020-06-06 DIAGNOSIS — R630 Anorexia: Secondary | ICD-10-CM

## 2020-06-19 ENCOUNTER — Inpatient Hospital Stay (HOSPITAL_BASED_OUTPATIENT_CLINIC_OR_DEPARTMENT_OTHER): Payer: Medicare Other | Admitting: Oncology

## 2020-06-19 ENCOUNTER — Inpatient Hospital Stay: Payer: Medicare Other

## 2020-06-19 ENCOUNTER — Other Ambulatory Visit: Payer: Self-pay

## 2020-06-19 VITALS — BP 177/98 | HR 71 | Temp 97.9°F | Resp 18 | Ht 65.0 in | Wt 139.1 lb

## 2020-06-19 VITALS — BP 184/94

## 2020-06-19 DIAGNOSIS — C61 Malignant neoplasm of prostate: Secondary | ICD-10-CM

## 2020-06-19 DIAGNOSIS — Z5111 Encounter for antineoplastic chemotherapy: Secondary | ICD-10-CM | POA: Diagnosis not present

## 2020-06-19 DIAGNOSIS — Z95828 Presence of other vascular implants and grafts: Secondary | ICD-10-CM

## 2020-06-19 LAB — CBC WITH DIFFERENTIAL (CANCER CENTER ONLY)
Abs Immature Granulocytes: 0.04 10*3/uL (ref 0.00–0.07)
Basophils Absolute: 0 10*3/uL (ref 0.0–0.1)
Basophils Relative: 0 %
Eosinophils Absolute: 0 10*3/uL (ref 0.0–0.5)
Eosinophils Relative: 0 %
HCT: 35.9 % — ABNORMAL LOW (ref 39.0–52.0)
Hemoglobin: 10.4 g/dL — ABNORMAL LOW (ref 13.0–17.0)
Immature Granulocytes: 0 %
Lymphocytes Relative: 5 %
Lymphs Abs: 0.6 10*3/uL — ABNORMAL LOW (ref 0.7–4.0)
MCH: 25.1 pg — ABNORMAL LOW (ref 26.0–34.0)
MCHC: 29 g/dL — ABNORMAL LOW (ref 30.0–36.0)
MCV: 86.5 fL (ref 80.0–100.0)
Monocytes Absolute: 0.4 10*3/uL (ref 0.1–1.0)
Monocytes Relative: 4 %
Neutro Abs: 9.8 10*3/uL — ABNORMAL HIGH (ref 1.7–7.7)
Neutrophils Relative %: 91 %
Platelet Count: 249 10*3/uL (ref 150–400)
RBC: 4.15 MIL/uL — ABNORMAL LOW (ref 4.22–5.81)
RDW: 17.9 % — ABNORMAL HIGH (ref 11.5–15.5)
WBC Count: 10.8 10*3/uL — ABNORMAL HIGH (ref 4.0–10.5)
nRBC: 0 % (ref 0.0–0.2)

## 2020-06-19 LAB — CMP (CANCER CENTER ONLY)
ALT: 10 U/L (ref 0–44)
AST: 19 U/L (ref 15–41)
Albumin: 2.6 g/dL — ABNORMAL LOW (ref 3.5–5.0)
Alkaline Phosphatase: 75 U/L (ref 38–126)
Anion gap: 6 (ref 5–15)
BUN: 18 mg/dL (ref 8–23)
CO2: 32 mmol/L (ref 22–32)
Calcium: 9 mg/dL (ref 8.9–10.3)
Chloride: 105 mmol/L (ref 98–111)
Creatinine: 0.68 mg/dL (ref 0.61–1.24)
GFR, Estimated: >60 mL/min (ref >60)
Glucose, Bld: 112 mg/dL — ABNORMAL HIGH (ref 70–99)
Potassium: 4.2 mmol/L (ref 3.5–5.1)
Sodium: 143 mmol/L (ref 135–145)
Total Bilirubin: <0.2 mg/dL — ABNORMAL LOW (ref 0.3–1.2)
Total Protein: 6.5 g/dL (ref 6.5–8.1)

## 2020-06-19 MED ORDER — SODIUM CHLORIDE 0.9% FLUSH
10.0000 mL | Freq: Once | INTRAVENOUS | Status: AC | PRN
  Administered 2020-06-19: 10 mL
  Filled 2020-06-19: qty 10

## 2020-06-19 MED ORDER — FAMOTIDINE IN NACL 20-0.9 MG/50ML-% IV SOLN
20.0000 mg | Freq: Once | INTRAVENOUS | Status: AC
Start: 2020-06-19 — End: 2020-06-19
  Administered 2020-06-19: 20 mg via INTRAVENOUS

## 2020-06-19 MED ORDER — FAMOTIDINE IN NACL 20-0.9 MG/50ML-% IV SOLN
INTRAVENOUS | Status: AC
  Filled 2020-06-19: qty 50

## 2020-06-19 MED ORDER — DIPHENHYDRAMINE HCL 50 MG/ML IJ SOLN
25.0000 mg | Freq: Once | INTRAMUSCULAR | Status: AC
  Administered 2020-06-19: 25 mg via INTRAVENOUS

## 2020-06-19 MED ORDER — SODIUM CHLORIDE 0.9 % IV SOLN
20.0000 mg/m2 | Freq: Once | INTRAVENOUS | Status: AC
  Administered 2020-06-19: 36 mg via INTRAVENOUS
  Filled 2020-06-19: qty 3.6

## 2020-06-19 MED ORDER — DEXAMETHASONE SODIUM PHOSPHATE 100 MG/10ML IJ SOLN
10.0000 mg | Freq: Once | INTRAMUSCULAR | Status: AC
  Administered 2020-06-19: 10 mg via INTRAVENOUS
  Filled 2020-06-19: qty 10

## 2020-06-19 MED ORDER — SODIUM CHLORIDE 0.9% FLUSH
10.0000 mL | INTRAVENOUS | Status: DC | PRN
  Administered 2020-06-19: 10 mL
  Filled 2020-06-19: qty 10

## 2020-06-19 MED ORDER — DIPHENHYDRAMINE HCL 50 MG/ML IJ SOLN
INTRAMUSCULAR | Status: AC
  Filled 2020-06-19: qty 1

## 2020-06-19 MED ORDER — SODIUM CHLORIDE 0.9 % IV SOLN
Freq: Once | INTRAVENOUS | Status: AC
  Filled 2020-06-19: qty 250

## 2020-06-19 MED ORDER — HEPARIN SOD (PORK) LOCK FLUSH 100 UNIT/ML IV SOLN
500.0000 [IU] | Freq: Once | INTRAVENOUS | Status: AC | PRN
  Administered 2020-06-19: 500 [IU]
  Filled 2020-06-19: qty 5

## 2020-06-19 NOTE — Progress Notes (Signed)
Hematology and Oncology Follow Up Visit  [name] Mathews [id number] 03/27/1946 74 y.o. 06/19/2020 8:00 AM    Principle Diagnosis: 74 year old man with advanced prostate cancer with pulmonary involvement and bone disease diagnosed in 2013.  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   Prior Therapy: 1. He was started on Lupron after attempted prostatectomy in 2001.    2. 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  3. He is status post radiation therapy to the prostate fossa completed in December 2018. He received 52.5 gr 20 fractions.  4. Zytiga 1000 mg po daily with prednisone 5 mg started in 09/2011.   5. Taxotere chemotherapy 75 mg per metered square with cycle 1 started on 01/19/2019.  His dose was reduced to 60 mg per metered square starting with cycle 8 of therapy.  He is status post 11 cycles of therapy.  Current therapy:   Xgeva 120 mg started on 10/31/2011.  This will be given every 6 weeks.   Eligard every 4 months.  Next injection will be in September 2021.  Jevtana chemotherapy 20 mg per metered square with cycle 1 given on Nov 08, 2019.  He is here for cycle 10 of therapy.    Interim History: [name] Mathews is here for a follow-up visit.  Since the last visit, he reports no major changes in his health.  His respiratory status is overall stable without any worsening shortness of breath or dyspnea on exertion.  Continues to be oxygen dependent without any decline in his respiratory status.  He was hospitalized briefly early part of December but did not require any intervention or thoracentesis.  He denies any bone pain pathological fractures.  He denies any worsening anorexia or fatigue.  His performance status quality of life reasonably maintained at this time.        Medications: Unchanged on  review. Current Outpatient Medications  Medication Sig Dispense Refill  . mirtazapine (REMERON) 15 MG tablet TAKE 1 TABLET BY MOUTH AT BEDTIME 30 tablet 0  . albuterol (VENTOLIN HFA) 108 (90 Base) MCG/ACT inhaler Inhale 1-2 puffs into the lungs every 6 (six) hours as needed for wheezing or shortness of breath. 18 g 2  . denosumab (XGEVA) 120 MG/1.7ML SOLN Inject 120 mg into the skin every 30 (thirty) days.    [name] dexamethasone (DECADRON) 4 MG tablet Take 4 mg twice a day. (Patient taking differently: Take 4 mg by mouth 2 (two) times daily. ) 90 tablet 3  . dextromethorphan-guaiFENesin (MUCINEX DM) 30-600 MG 12hr tablet Take 1 tablet by mouth 2 (two) times daily as needed for cough.    . gabapentin (NEURONTIN) 300 MG capsule Take 1 capsule (300 mg total) by mouth at bedtime. 90 capsule 3  . leuprolide (LUPRON) 30 MG injection Inject 30 mg into the muscle every 4 (four) months.    . Loperamide HCl (IMODIUM PO) Take 2 capsules by mouth in the morning and at bedtime.    . Nutritional Supplements (ENSURE ACTIVE HIGH PROTEIN) LIQD Use 3 cans daily. (Patient taking differently: Take 1 Can by mouth in the morning and at bedtime. [name]) 2844 mL 3  . OVER THE COUNTER MEDICATION Take 5 mLs by mouth daily as needed (cough). Cheritussin cough syrup    . prochlorperazine (COMPAZINE) 10 MG tablet TAKE 1 TABLET BY MOUTH EVERY 6 HOURS  AS NEEDED FOR NAUSEA FOR VOMITING (Patient taking differently: Take 10 mg by mouth every 6 (six) hours as needed for nausea or vomiting. ) 30 tablet 3   No current facility-administered medications for this visit.   Facility-Administered Medications Ordered in Other Visits  Medication Dose Route Frequency Provider Last Rate Last Admin  . sodium chloride flush (NS) 0.9 % injection 10 mL  10 mL Intracatheter PRN [name], MD   10 mL at 09/07/19 0954     Allergies:  Allergies  Allergen Reactions  . Bee Venom Anaphylaxis, Shortness Of Breath and Swelling    Tongue swelling.   .  Wasp Venom Anaphylaxis, Shortness Of Breath and Swelling    Tongue swelling   . Percocet [Oxycodone-Acetaminophen] Palpitations       Physical Exam:      Blood pressure (!) 177/98, pulse 71, temperature 97.9 F (36.6 C), temperature source Tympanic, resp. rate 18, height 5\' 5"  (1.651 m), weight 139 lb 1.6 oz (63.1 kg), SpO2 100 %.        ECOG: 2      General appearance: Alert, awake without any distress. Head: Atraumatic without abnormalities Oropharynx: Without any thrush or ulcers. Eyes: No scleral icterus. Lymph nodes: No lymphadenopathy noted in the cervical, supraclavicular, or axillary nodes Heart:regular rate and rhythm, without any murmurs or gallops.   Lung: Clear to auscultation without any rhonchi, wheezes or dullness to percussion. Abdomin: Soft, nontender without any shifting dullness or ascites. Musculoskeletal: No clubbing or cyanosis. Neurological: No motor or sensory deficits. Skin: No rashes or lesions.          Lab Results: Lab Results  Component Value Date   WBC 6.0 05/25/2020   HGB 10.0 (L) 05/25/2020   HCT 34.4 (L) 05/25/2020   MCV 88.9 05/25/2020   PLT 270 05/25/2020     Chemistry      Component Value Date/Time   NA 140 05/25/2020 1600   NA 141 05/20/2017 1425   K 3.6 05/25/2020 1600   K 4.3 05/20/2017 1425   CL 101 05/25/2020 1600   CL 105 12/08/2012 1504   CO2 32 05/25/2020 1600   CO2 31 (H) 05/20/2017 1425   BUN 12 05/25/2020 1600   BUN 17.8 05/20/2017 1425   CREATININE 0.60 (L) 05/25/2020 1600   CREATININE 0.71 05/23/2020 0817   CREATININE 0.9 05/20/2017 1425      Component Value Date/Time   CALCIUM 7.8 (L) 05/25/2020 1600   CALCIUM 9.4 05/20/2017 1425   ALKPHOS 95 05/23/2020 0817   ALKPHOS 72 05/20/2017 1425   AST 19 05/23/2020 0817   AST 15 05/20/2017 1425   ALT 6 05/23/2020 0817   ALT <6 05/20/2017 1425   BILITOT 0.2 (L) 05/23/2020 0817   BILITOT 0.25 05/20/2017 1425           Results for [name] Mathews, [name] Mathews (MRN [id number]) as of 06/19/2020 08:02  Ref. Range 04/24/2020 11:40 05/23/2020 08:17  Prostate Specific Ag, Serum Latest Ref Range: 0.0 - 4.0 ng/mL 225.0 (H) 328.0 (H)    IMPRESSION: 1. No evidence of pulmonary embolus. 2. Stable metastatic prostate cancer with multifocal areas of lung consolidation, pulmonary nodules, and diffuse sclerotic bony metastases. 3. Stable pleural effusions.    Impression and Plan:   74 year old man with:    1.  Advanced prostate cancer with disease to the bone and lung involvement diagnosed in 2013.  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   He is currently receiving palliative chemotherapy  with Jevtana without any major complications.  CT scan on May 25, 2020 showed no evidence of metastatic progression at this time.  His PSA is showing some rise which likely will translate disease progression future.  Risks and benefits of continuing with his current regimen were discussed.  Fortunately there is no effective alternative regimen at this time.  If he opts against chemotherapy that hospice will be his next option.  After discussion today, he is agreeable to continue with this treatment given the overall clinical benefit.  2.  Dyspnea on exertion: Overall stable and related to likely metastatic disease to the lung.    3. Androgen deprivation: He is currently on Eligard which was given October 2021.  This will be repeated in 2 months.  Complication plan weight gain and hot flashes were reiterated.   4.  IV access: Port-A-Cath remains accessed without any issues.  5.  Prognosis and goals of care: Therapy remains palliative although aggressive measures still warranted given his reasonable performance status.   6.  Weight loss: Improved at this time.  He is eating better.   7.  Diarrhea: Related to chemotherapy and seems to be manageable at this time.   8. Bone directed therapy: I recommended continuing Xgeva for the time  being and will be repeated every 4 months.  Complication clinic osteonecrosis of the jaw and hypocalcemia were reviewed.  9.  Growth factor support: He is currently receiving that after each cycle of therapy and he is at high risk of developing neutropenia and sepsis.   10.  Followup: Will be in the next 3 to 4 weeks for next cycle of therapy.  30  minutes were spent on this visit.  The time was dedicated to reviewing his disease status, discussing treatment options and future plan of care review.  Eli Hose, MD 12/28/20218:00 AM

## 2020-06-19 NOTE — Patient Instructions (Signed)
[facility] Discharge Instructions for Patients Receiving Chemotherapy  Today you received the following chemotherapy agents: cabazitaxel.  To help prevent nausea and vomiting after your treatment, we encourage you to take your nausea medication as directed.   If you develop nausea and vomiting that is not controlled by your nausea medication, call the clinic.   BELOW ARE SYMPTOMS THAT SHOULD BE REPORTED IMMEDIATELY:  *FEVER GREATER THAN 100.5 F  *CHILLS WITH OR WITHOUT FEVER  NAUSEA AND VOMITING THAT IS NOT CONTROLLED WITH YOUR NAUSEA MEDICATION  *UNUSUAL SHORTNESS OF BREATH  *UNUSUAL BRUISING OR BLEEDING  TENDERNESS IN MOUTH AND THROAT WITH OR WITHOUT PRESENCE OF ULCERS  *URINARY PROBLEMS  *BOWEL PROBLEMS  UNUSUAL RASH Items with * indicate a potential emergency and should be followed up as soon as possible.  Feel free to call the clinic should you have any questions or concerns. The clinic phone number is [phone].  Please show the CHEMO ALERT CARD at check-in to the Emergency Department and triage nurse.   

## 2020-06-19 NOTE — Progress Notes (Signed)
Per Dr. Alen Blew, ok to treat with elevated BP. No additional orders received.

## 2020-06-20 ENCOUNTER — Telehealth: Payer: Self-pay | Admitting: Oncology

## 2020-06-20 LAB — PROSTATE-SPECIFIC AG, SERUM (LABCORP): Prostate Specific Ag, Serum: 786 ng/mL — ABNORMAL HIGH (ref 0.0–4.0)

## 2020-06-20 NOTE — Telephone Encounter (Signed)
Scheduled per 12/30 los, patient has been called and voicemail was left. 

## 2020-06-21 ENCOUNTER — Other Ambulatory Visit: Payer: Self-pay

## 2020-06-21 ENCOUNTER — Inpatient Hospital Stay: Payer: Medicare Other

## 2020-06-21 VITALS — BP 97/56 | HR 94 | Temp 98.2°F | Resp 16

## 2020-06-21 DIAGNOSIS — Z5111 Encounter for antineoplastic chemotherapy: Secondary | ICD-10-CM | POA: Diagnosis not present

## 2020-06-21 DIAGNOSIS — C61 Malignant neoplasm of prostate: Secondary | ICD-10-CM

## 2020-06-21 MED ORDER — PEGFILGRASTIM-CBQV 6 MG/0.6ML ~~LOC~~ SOSY
6.0000 mg | PREFILLED_SYRINGE | Freq: Once | SUBCUTANEOUS | Status: AC
  Administered 2020-06-21: 6 mg via SUBCUTANEOUS

## 2020-06-21 NOTE — Patient Instructions (Signed)

## 2020-06-29 ENCOUNTER — Other Ambulatory Visit: Payer: Self-pay | Admitting: Family Medicine

## 2020-06-29 ENCOUNTER — Telehealth: Payer: Self-pay

## 2020-06-29 DIAGNOSIS — C61 Malignant neoplasm of prostate: Secondary | ICD-10-CM

## 2020-06-29 NOTE — Progress Notes (Signed)
Placed DME order for shower chair due to patient's increasing fatigue and SOB from his cancer.

## 2020-06-29 NOTE — Telephone Encounter (Signed)
Submitted DME order for patient's shower chair. Thanks!

## 2020-06-29 NOTE — Telephone Encounter (Signed)
Community message sent to [employer]. Will await response.   Rhealynn Myhre C Carling Liberman, RN  

## 2020-06-29 NOTE — Telephone Encounter (Signed)
Patient LVM on nurse line requesting an order for a shower chair. Patient reports he is needing to sit down while he takes a shower due to fatigue and SOB. Please place DME order and let RN team know so we can process.

## 2020-07-02 ENCOUNTER — Other Ambulatory Visit: Payer: Self-pay | Admitting: Family Medicine

## 2020-07-02 DIAGNOSIS — C61 Malignant neoplasm of prostate: Secondary | ICD-10-CM

## 2020-07-02 NOTE — Progress Notes (Signed)
DME order placed per patient's request for 4 wheeled walker

## 2020-07-02 NOTE — Telephone Encounter (Signed)
[name] Mathews, [name] Mathews; [name] Mathews; [name] Mathews   received

## 2020-07-02 NOTE — Telephone Encounter (Signed)
Submitted. Thanks

## 2020-07-02 NOTE — Telephone Encounter (Signed)
Patient calls nurse line again requesting another DME request. Patient reports he would benefit from a 4 point walker with wheels. Will forward to provider to place another DME order.

## 2020-07-02 NOTE — Telephone Encounter (Signed)
Community message sent to [employer] team. Will await response.

## 2020-07-10 ENCOUNTER — Other Ambulatory Visit: Payer: Medicare Other

## 2020-07-10 ENCOUNTER — Other Ambulatory Visit: Payer: Self-pay | Admitting: Family Medicine

## 2020-07-10 DIAGNOSIS — R630 Anorexia: Secondary | ICD-10-CM

## 2020-07-11 ENCOUNTER — Emergency Department (HOSPITAL_COMMUNITY): Payer: Medicare Other

## 2020-07-11 ENCOUNTER — Other Ambulatory Visit: Payer: Self-pay

## 2020-07-11 ENCOUNTER — Encounter (HOSPITAL_COMMUNITY): Payer: Self-pay

## 2020-07-11 DIAGNOSIS — R1310 Dysphagia, unspecified: Secondary | ICD-10-CM | POA: Diagnosis present

## 2020-07-11 DIAGNOSIS — F419 Anxiety disorder, unspecified: Secondary | ICD-10-CM | POA: Diagnosis present

## 2020-07-11 DIAGNOSIS — Z20822 Contact with and (suspected) exposure to covid-19: Secondary | ICD-10-CM | POA: Diagnosis present

## 2020-07-11 DIAGNOSIS — R0600 Dyspnea, unspecified: Secondary | ICD-10-CM | POA: Diagnosis not present

## 2020-07-11 DIAGNOSIS — Z8249 Family history of ischemic heart disease and other diseases of the circulatory system: Secondary | ICD-10-CM

## 2020-07-11 DIAGNOSIS — Z79899 Other long term (current) drug therapy: Secondary | ICD-10-CM

## 2020-07-11 DIAGNOSIS — Z87891 Personal history of nicotine dependence: Secondary | ICD-10-CM

## 2020-07-11 DIAGNOSIS — C7951 Secondary malignant neoplasm of bone: Secondary | ICD-10-CM | POA: Diagnosis present

## 2020-07-11 DIAGNOSIS — I10 Essential (primary) hypertension: Secondary | ICD-10-CM | POA: Diagnosis present

## 2020-07-11 DIAGNOSIS — Z9221 Personal history of antineoplastic chemotherapy: Secondary | ICD-10-CM

## 2020-07-11 DIAGNOSIS — Z8049 Family history of malignant neoplasm of other genital organs: Secondary | ICD-10-CM

## 2020-07-11 DIAGNOSIS — I891 Lymphangitis: Secondary | ICD-10-CM | POA: Diagnosis present

## 2020-07-11 DIAGNOSIS — C7800 Secondary malignant neoplasm of unspecified lung: Secondary | ICD-10-CM | POA: Diagnosis not present

## 2020-07-11 DIAGNOSIS — C61 Malignant neoplasm of prostate: Secondary | ICD-10-CM | POA: Diagnosis present

## 2020-07-11 DIAGNOSIS — F05 Delirium due to known physiological condition: Secondary | ICD-10-CM | POA: Diagnosis not present

## 2020-07-11 DIAGNOSIS — J440 Chronic obstructive pulmonary disease with acute lower respiratory infection: Secondary | ICD-10-CM | POA: Diagnosis present

## 2020-07-11 DIAGNOSIS — J189 Pneumonia, unspecified organism: Secondary | ICD-10-CM | POA: Diagnosis present

## 2020-07-11 DIAGNOSIS — Z66 Do not resuscitate: Secondary | ICD-10-CM | POA: Diagnosis present

## 2020-07-11 DIAGNOSIS — J910 Malignant pleural effusion: Secondary | ICD-10-CM | POA: Diagnosis present

## 2020-07-11 DIAGNOSIS — J9621 Acute and chronic respiratory failure with hypoxia: Secondary | ICD-10-CM | POA: Diagnosis present

## 2020-07-11 DIAGNOSIS — Z6822 Body mass index (BMI) 22.0-22.9, adult: Secondary | ICD-10-CM

## 2020-07-11 DIAGNOSIS — Z823 Family history of stroke: Secondary | ICD-10-CM

## 2020-07-11 DIAGNOSIS — E440 Moderate protein-calorie malnutrition: Secondary | ICD-10-CM | POA: Diagnosis present

## 2020-07-11 DIAGNOSIS — E785 Hyperlipidemia, unspecified: Secondary | ICD-10-CM | POA: Diagnosis present

## 2020-07-11 DIAGNOSIS — G2581 Restless legs syndrome: Secondary | ICD-10-CM | POA: Diagnosis present

## 2020-07-11 DIAGNOSIS — D649 Anemia, unspecified: Secondary | ICD-10-CM | POA: Diagnosis present

## 2020-07-11 DIAGNOSIS — E8770 Fluid overload, unspecified: Secondary | ICD-10-CM | POA: Diagnosis not present

## 2020-07-11 NOTE — ED Triage Notes (Signed)
Patient arrived from home with complaints of SOB that started this morning, returned to baseline after using his inhaler. Reports it did start back later this evening. 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

## 2020-07-12 ENCOUNTER — Encounter (HOSPITAL_COMMUNITY): Payer: Self-pay | Admitting: Internal Medicine

## 2020-07-12 ENCOUNTER — Inpatient Hospital Stay (HOSPITAL_COMMUNITY): Payer: Medicare Other

## 2020-07-12 ENCOUNTER — Inpatient Hospital Stay (HOSPITAL_COMMUNITY)
Admission: EM | Admit: 2020-07-12 | Discharge: 2020-07-19 | DRG: 180 | Disposition: A | Discharge status: Hospice | Payer: Medicare Other | Attending: Internal Medicine | Admitting: Internal Medicine

## 2020-07-12 DIAGNOSIS — R0600 Dyspnea, unspecified: Secondary | ICD-10-CM | POA: Diagnosis present

## 2020-07-12 DIAGNOSIS — R0602 Shortness of breath: Secondary | ICD-10-CM

## 2020-07-12 DIAGNOSIS — J189 Pneumonia, unspecified organism: Secondary | ICD-10-CM

## 2020-07-12 DIAGNOSIS — C7951 Secondary malignant neoplasm of bone: Secondary | ICD-10-CM | POA: Diagnosis present

## 2020-07-12 DIAGNOSIS — Z66 Do not resuscitate: Secondary | ICD-10-CM | POA: Diagnosis present

## 2020-07-12 DIAGNOSIS — Z20822 Contact with and (suspected) exposure to covid-19: Secondary | ICD-10-CM | POA: Diagnosis present

## 2020-07-12 DIAGNOSIS — Z8249 Family history of ischemic heart disease and other diseases of the circulatory system: Secondary | ICD-10-CM | POA: Diagnosis not present

## 2020-07-12 DIAGNOSIS — C61 Malignant neoplasm of prostate: Secondary | ICD-10-CM | POA: Diagnosis present

## 2020-07-12 DIAGNOSIS — J910 Malignant pleural effusion: Secondary | ICD-10-CM | POA: Diagnosis present

## 2020-07-12 DIAGNOSIS — F419 Anxiety disorder, unspecified: Secondary | ICD-10-CM | POA: Diagnosis present

## 2020-07-12 DIAGNOSIS — J9621 Acute and chronic respiratory failure with hypoxia: Secondary | ICD-10-CM

## 2020-07-12 DIAGNOSIS — Z8049 Family history of malignant neoplasm of other genital organs: Secondary | ICD-10-CM | POA: Diagnosis not present

## 2020-07-12 DIAGNOSIS — Z823 Family history of stroke: Secondary | ICD-10-CM | POA: Diagnosis not present

## 2020-07-12 DIAGNOSIS — Z6822 Body mass index (BMI) 22.0-22.9, adult: Secondary | ICD-10-CM | POA: Diagnosis not present

## 2020-07-12 DIAGNOSIS — C7800 Secondary malignant neoplasm of unspecified lung: Secondary | ICD-10-CM | POA: Diagnosis present

## 2020-07-12 DIAGNOSIS — I891 Lymphangitis: Secondary | ICD-10-CM | POA: Diagnosis present

## 2020-07-12 DIAGNOSIS — E785 Hyperlipidemia, unspecified: Secondary | ICD-10-CM | POA: Diagnosis present

## 2020-07-12 DIAGNOSIS — R0603 Acute respiratory distress: Secondary | ICD-10-CM | POA: Diagnosis not present

## 2020-07-12 DIAGNOSIS — F05 Delirium due to known physiological condition: Secondary | ICD-10-CM | POA: Diagnosis not present

## 2020-07-12 DIAGNOSIS — I10 Essential (primary) hypertension: Secondary | ICD-10-CM | POA: Diagnosis present

## 2020-07-12 DIAGNOSIS — G2581 Restless legs syndrome: Secondary | ICD-10-CM | POA: Diagnosis present

## 2020-07-12 DIAGNOSIS — Z79899 Other long term (current) drug therapy: Secondary | ICD-10-CM | POA: Diagnosis not present

## 2020-07-12 DIAGNOSIS — R1310 Dysphagia, unspecified: Secondary | ICD-10-CM | POA: Diagnosis present

## 2020-07-12 DIAGNOSIS — J440 Chronic obstructive pulmonary disease with acute lower respiratory infection: Secondary | ICD-10-CM | POA: Diagnosis present

## 2020-07-12 DIAGNOSIS — Z87891 Personal history of nicotine dependence: Secondary | ICD-10-CM | POA: Diagnosis not present

## 2020-07-12 DIAGNOSIS — J9611 Chronic respiratory failure with hypoxia: Secondary | ICD-10-CM

## 2020-07-12 DIAGNOSIS — D649 Anemia, unspecified: Secondary | ICD-10-CM | POA: Diagnosis present

## 2020-07-12 DIAGNOSIS — E440 Moderate protein-calorie malnutrition: Secondary | ICD-10-CM | POA: Diagnosis present

## 2020-07-12 LAB — CBC WITH DIFFERENTIAL/PLATELET
Abs Immature Granulocytes: 0.08 10*3/uL — ABNORMAL HIGH (ref 0.00–0.07)
Basophils Absolute: 0 10*3/uL (ref 0.0–0.1)
Basophils Relative: 0 %
Eosinophils Absolute: 0 10*3/uL (ref 0.0–0.5)
Eosinophils Relative: 0 %
HCT: 35 % — ABNORMAL LOW (ref 39.0–52.0)
Hemoglobin: 9.9 g/dL — ABNORMAL LOW (ref 13.0–17.0)
Immature Granulocytes: 1 %
Lymphocytes Relative: 5 %
Lymphs Abs: 0.8 10*3/uL (ref 0.7–4.0)
MCH: 25.1 pg — ABNORMAL LOW (ref 26.0–34.0)
MCHC: 28.3 g/dL — ABNORMAL LOW (ref 30.0–36.0)
MCV: 88.8 fL (ref 80.0–100.0)
Monocytes Absolute: 1.3 10*3/uL — ABNORMAL HIGH (ref 0.1–1.0)
Monocytes Relative: 8 %
Neutro Abs: 13.9 10*3/uL — ABNORMAL HIGH (ref 1.7–7.7)
Neutrophils Relative %: 86 %
Platelets: 371 10*3/uL (ref 150–400)
RBC: 3.94 MIL/uL — ABNORMAL LOW (ref 4.22–5.81)
RDW: 18.3 % — ABNORMAL HIGH (ref 11.5–15.5)
WBC: 16.1 10*3/uL — ABNORMAL HIGH (ref 4.0–10.5)
nRBC: 0 % (ref 0.0–0.2)

## 2020-07-12 LAB — MRSA PCR SCREENING: MRSA by PCR: NEGATIVE

## 2020-07-12 LAB — BASIC METABOLIC PANEL
Anion gap: 9 (ref 5–15)
BUN: 15 mg/dL (ref 8–23)
CO2: 31 mmol/L (ref 22–32)
Calcium: 8.2 mg/dL — ABNORMAL LOW (ref 8.9–10.3)
Chloride: 104 mmol/L (ref 98–111)
Creatinine, Ser: 0.84 mg/dL (ref 0.61–1.24)
GFR, Estimated: >60 mL/min (ref >60)
Glucose, Bld: 107 mg/dL — ABNORMAL HIGH (ref 70–99)
Potassium: 4 mmol/L (ref 3.5–5.1)
Sodium: 144 mmol/L (ref 135–145)

## 2020-07-12 LAB — STREP PNEUMONIAE URINARY ANTIGEN: Strep Pneumo Urinary Antigen: NEGATIVE

## 2020-07-12 LAB — D-DIMER, QUANTITATIVE: D-Dimer, Quant: 4.5 ug/mL-FEU — ABNORMAL HIGH (ref 0.00–0.50)

## 2020-07-12 LAB — SARS CORONAVIRUS 2 (TAT 6-24 HRS): SARS Coronavirus 2: NEGATIVE

## 2020-07-12 LAB — LACTIC ACID, PLASMA: Lactic Acid, Venous: 1 mmol/L (ref 0.5–1.9)

## 2020-07-12 MED ORDER — HEPARIN SODIUM (PORCINE) 5000 UNIT/ML IJ SOLN
5000.0000 [IU] | Freq: Three times a day (TID) | INTRAMUSCULAR | Status: DC
  Administered 2020-07-12 – 2020-07-19 (×21): 5000 [IU] via SUBCUTANEOUS
  Filled 2020-07-12 (×21): qty 1

## 2020-07-12 MED ORDER — IPRATROPIUM-ALBUTEROL 20-100 MCG/ACT IN AERS
1.0000 | INHALATION_SPRAY | Freq: Four times a day (QID) | RESPIRATORY_TRACT | Status: DC
  Filled 2020-07-12: qty 4

## 2020-07-12 MED ORDER — ONDANSETRON HCL 4 MG PO TABS: 4.0000 mg | ORAL_TABLET | Freq: Four times a day (QID) | ORAL | Status: DC | PRN

## 2020-07-12 MED ORDER — DOXYCYCLINE HYCLATE 100 MG PO CAPS: 100.0000 mg | ORAL_CAPSULE | Freq: Two times a day (BID) | ORAL | 0 refills | Status: DC

## 2020-07-12 MED ORDER — MIRTAZAPINE 15 MG PO TABS
15.0000 mg | ORAL_TABLET | Freq: Every day | ORAL | Status: DC
  Administered 2020-07-12 – 2020-07-17 (×6): 15 mg via ORAL
  Filled 2020-07-12 (×6): qty 1

## 2020-07-12 MED ORDER — ENSURE ENLIVE PO LIQD
237.0000 mL | Freq: Two times a day (BID) | ORAL | Status: DC
  Administered 2020-07-12 – 2020-07-18 (×8): 237 mL via ORAL
  Filled 2020-07-12: qty 237

## 2020-07-12 MED ORDER — SODIUM CHLORIDE 0.9 % IV SOLN
500.0000 mg | INTRAVENOUS | Status: DC
  Administered 2020-07-12 – 2020-07-13 (×2): 500 mg via INTRAVENOUS
  Filled 2020-07-12 (×3): qty 500

## 2020-07-12 MED ORDER — GABAPENTIN 300 MG PO CAPS
300.0000 mg | ORAL_CAPSULE | Freq: Every day | ORAL | Status: DC
  Administered 2020-07-12 – 2020-07-17 (×6): 300 mg via ORAL
  Filled 2020-07-12 (×6): qty 1

## 2020-07-12 MED ORDER — ONDANSETRON HCL 4 MG/2ML IJ SOLN: 4.0000 mg | Freq: Four times a day (QID) | INTRAMUSCULAR | Status: DC | PRN

## 2020-07-12 MED ORDER — ACETAMINOPHEN 325 MG PO TABS
650.0000 mg | ORAL_TABLET | Freq: Four times a day (QID) | ORAL | Status: DC | PRN
  Administered 2020-07-12 – 2020-07-19 (×3): 650 mg via ORAL
  Filled 2020-07-12 (×3): qty 2

## 2020-07-12 MED ORDER — SODIUM CHLORIDE 0.9 % IV SOLN: 2.0000 g | Freq: Once | INTRAVENOUS | Status: DC

## 2020-07-12 MED ORDER — CHLORHEXIDINE GLUCONATE CLOTH 2 % EX PADS
6.0000 | MEDICATED_PAD | Freq: Every day | CUTANEOUS | Status: DC
  Administered 2020-07-12 – 2020-07-18 (×7): 6 via TOPICAL

## 2020-07-12 MED ORDER — ALBUTEROL SULFATE HFA 108 (90 BASE) MCG/ACT IN AERS
2.0000 | INHALATION_SPRAY | RESPIRATORY_TRACT | Status: DC | PRN
  Administered 2020-07-12 (×2): 2 via RESPIRATORY_TRACT
  Filled 2020-07-12: qty 6.7

## 2020-07-12 MED ORDER — ACETAMINOPHEN 650 MG RE SUPP: 650.0000 mg | Freq: Four times a day (QID) | RECTAL | Status: DC | PRN

## 2020-07-12 MED ORDER — SODIUM CHLORIDE 0.9 % IV SOLN
2.0000 g | Freq: Once | INTRAVENOUS | Status: AC
  Administered 2020-07-12: 2 g via INTRAVENOUS
  Filled 2020-07-12: qty 2

## 2020-07-12 MED ORDER — SODIUM CHLORIDE 0.9 % IV SOLN
2.0000 g | Freq: Three times a day (TID) | INTRAVENOUS | Status: AC
  Administered 2020-07-12 – 2020-07-18 (×19): 2 g via INTRAVENOUS
  Filled 2020-07-12 (×19): qty 2

## 2020-07-12 MED ORDER — IOHEXOL 350 MG/ML SOLN
75.0000 mL | Freq: Once | INTRAVENOUS | Status: AC | PRN
  Administered 2020-07-12: 75 mL via INTRAVENOUS

## 2020-07-12 NOTE — ED Notes (Addendum)
Blood cultures ordered and collected after 1 dose of antibiotics administered.

## 2020-07-12 NOTE — H&P (Signed)
History and Physical    [name] Mathews RWE:[id number] DOB: 1945-07-01 DOA: 07/12/2020  PCP: Shary Key, DO  Patient coming from: Home  Chief Complaint: Dyspnea  HPI: [name] Mathews is a 75 y.o. 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 Presenting with dyspnea. Reports that 2 days ago he felt "a rattling" in his chest and was gasping for air. His daughter helped him sit up to help give him more room to breathe. He took a steroid pill and some albuterol. After about an hour, he felt his symptoms improve a little. He states that he was able to sit in his recliner for the rest of the day. If he tried to move or even eat/drink, his breathing worsened. This continued through last night around 9 pm. At that time, he felt that he couldn't catch up on his breathing and called for EMS to bring him to the hospital. He denies any other aggravating or alleviating factors.    ED Course: CXR showed stable multifocal opacities. He was given albuterol. [facility] was called for admission.   Review of Systems:  Denies CP, fevers, N/V/D, abdominal pain. Reports non-productive cough. Review of systems is otherwise negative for all not mentioned in HPI.   PMHx Past Medical History:  Diagnosis Date  . Hyperlipidemia   . Hypertension   . Prostate cancer ([place])    metastasis bones  . Restless leg syndrome     PSHx Past Surgical History:  Procedure Laterality Date  . IR CV LINE INJECTION  02/15/2019  . IR IMAGING GUIDED PORT INSERTION  01/11/2019  . IR THORACENTESIS ASP PLEURAL SPACE W/IMG GUIDE  11/25/2019  . PELVIC LYMPH NODE DISSECTION     prostate remains intact    SocHx  reports that he quit smoking about 47 years ago. He started smoking about 53 years ago. He has a 1.50 pack-year smoking history. He has never used smokeless tobacco. He reports that he does not drink alcohol and does not use drugs.  Allergies  Allergen Reactions  . Bee Venom  Anaphylaxis, Shortness Of Breath and Swelling    Tongue swelling.   . Wasp Venom Anaphylaxis, Shortness Of Breath and Swelling    Tongue swelling   . Percocet [Oxycodone-Acetaminophen] Palpitations    FamHx Family History  Problem Relation Age of Onset  . Heart attack Mother 71       died of heart attack  . Cervical cancer Sister 27  . Stroke Brother 1    Prior to Admission medications   Medication Sig Start Date End Date Taking? Authorizing Provider  albuterol (VENTOLIN HFA) 108 (90 Base) MCG/ACT inhaler Inhale 1-2 puffs into the lungs every 6 (six) hours as needed for wheezing or shortness of breath. 01/23/20  Yes Olalere, Adewale A, MD  denosumab (XGEVA) 120 MG/1.7ML SOLN injection Inject 120 mg into the skin every 30 (thirty) days.   Yes [provider]  doxycycline (VIBRAMYCIN) 100 MG capsule Take 1 capsule (100 mg total) by mouth 2 (two) times daily. One po bid x 7 days 07/12/20  Yes Molpus, John, MD  gabapentin (NEURONTIN) 300 MG capsule Take 1 capsule (300 mg total) by mouth at bedtime. 08/17/19  Yes [name], MD  leuprolide (LUPRON) 30 MG injection Inject 30 mg into the muscle every 4 (four) months.   Yes [provider]  mirtazapine (REMERON) 15 MG tablet TAKE 1 TABLET BY MOUTH AT BEDTIME 07/11/20  Yes [name] Mathews, [name] Picking,  DO  Nutritional Supplements (ENSURE ACTIVE HIGH PROTEIN) LIQD Use 3 cans daily. Patient taking differently: Take 1 Can by mouth in the morning and at bedtime. . 01/31/20  Yes [name], MD  dexamethasone (DECADRON) 4 MG tablet Take 4 mg twice a day. Patient not taking: Reported on 07/12/2020 04/24/20   [name], MD    Physical Exam: Vitals:   07/12/20 0400 07/12/20 0530 07/12/20 0600 07/12/20 0730  BP: 125/82 126/80 138/85 (!) 127/94  Pulse: 93 91 96 98  Resp: 14 12 (!) 22 (!) 33  Temp:      TempSrc:      SpO2: 97% 100% 94% 95%    General: 75 y.o. male resting in bed in NAD Eyes: PERRL, normal sclera ENMT: Nares  patent w/o discharge, orophaynx clear, dentition normal, ears w/o discharge/lesions/ulcers Neck: Supple, trachea midline Cardiovascular: RRR, +S1, S2, no m/g/r, equal pulses throughout Respiratory: scatter rhonchi, decreased sounds at bases, increased WOB, breathless with speech GI: BS+, NDNT, no masses noted, no organomegaly noted MSK: No e/c/c Skin: No rashes, bruises, ulcerations noted Neuro: A&O x 3, no focal deficits Psyc: Appropriate interaction and affect, calm/cooperative  Labs on Admission: I have personally reviewed following labs and imaging studies  CBC: Recent Labs  Lab 07/12/20 0523  WBC 16.1*  NEUTROABS 13.9*  HGB 9.9*  HCT 35.0*  MCV 88.8  PLT 338   Basic Metabolic Panel: Recent Labs  Lab 07/12/20 0523  NA 144  K 4.0  CL 104  CO2 31  GLUCOSE 107*  BUN 15  CREATININE 0.84  CALCIUM 8.2*   GFR: CrCl cannot be calculated (Unknown ideal weight.). Liver Function Tests: No results for input(s): AST, ALT, ALKPHOS, BILITOT, PROT, ALBUMIN in the last 168 hours. No results for input(s): LIPASE, AMYLASE in the last 168 hours. No results for input(s): AMMONIA in the last 168 hours. Coagulation Profile: No results for input(s): INR, PROTIME in the last 168 hours. Cardiac Enzymes: No results for input(s): CKTOTAL, CKMB, CKMBINDEX, TROPONINI in the last 168 hours. BNP (last 3 results) Recent Labs    03/06/20 1504  PROBNP 78.0   HbA1C: No results for input(s): HGBA1C in the last 72 hours. CBG: No results for input(s): GLUCAP in the last 168 hours. Lipid Profile: No results for input(s): CHOL, HDL, LDLCALC, TRIG, CHOLHDL, LDLDIRECT in the last 72 hours. Thyroid Function Tests: No results for input(s): TSH, T4TOTAL, FREET4, T3FREE, THYROIDAB in the last 72 hours. Anemia Panel: No results for input(s): VITAMINB12, FOLATE, FERRITIN, TIBC, IRON, RETICCTPCT in the last 72 hours. Urine analysis:    Component Value Date/Time   COLORURINE YELLOW 05/25/2020 1522    APPEARANCEUR CLEAR 05/25/2020 1522   LABSPEC 1.024 05/25/2020 1522   PHURINE 7.0 05/25/2020 1522   GLUCOSEU NEGATIVE 05/25/2020 1522   HGBUR NEGATIVE 05/25/2020 1522   BILIRUBINUR NEGATIVE 05/25/2020 [place] 05/25/2020 1522   PROTEINUR 30 (A) 05/25/2020 1522   NITRITE NEGATIVE 05/25/2020 1522   LEUKOCYTESUR NEGATIVE 05/25/2020 1522    Radiological Exams on Admission: DG Chest 2 View  Result Date: 07/11/2020 CLINICAL DATA:  Shortness of breath EXAM: CHEST - 2 VIEW COMPARISON:  May 25, 2020 FINDINGS: The heart size and mediastinal contours are within normal limits. Again noted are multifocal areas of airspace opacities and nodular opacities. There are bilateral pleural effusions present. Diffuse sclerosis seen throughout the visualized osseous structures. A right-sided MediPort catheter seen with the tip at the superior cavoatrial junction. IMPRESSION: No significant change  in the multifocal airspace opacities, likely combination of consolidation and pulmonary nodules. Unchanged up bilateral pleural effusions Electronically Signed   By: Prudencio Pair M.D.   On: 07/11/2020 22:33    EKG: Independently reviewed. Sinus tach, no st elevations  Assessment/Plan Multifocal PNA Chronic hypoxic respiratory failure Dyspnea     - admit to inpt, tele     - CXR w/ multifocal opacities, concerning for PNA     - he's on 2L O2 at baseline d/t his prostate CA w/ lung mets; now requiring 4L to feel more comfortable     - d-dimer is elevated     - check CTA chest/LE dopplers, also start cefepime, check MRSA swab, urine legionella/strep     - COVID screen is pending, he will be admitted as PUI for now, check inflammatory markers     - add combivent shceduled  Hx of prostate CA w/ lung mets     - continue outpt follow up  Normocytic anemia     - stable, follow  Moderate protein-calorie malnutrition     - owing to chronic disease     - continue dietary supplements  Goals of  Care     - pt is DNR  DVT prophylaxis: heparin  Code Status: DNR  Family Communication: None at bedside  Consults called: None   Status is: Inpatient  Remains inpatient appropriate because:Inpatient level of care appropriate due to severity of illness   Dispo: The patient is from: Home              Anticipated d/c is to: Home              Anticipated d/c date is: 3 days              Patient currently is not medically stable to d/c.  [name] Finner DO [facility]  If 7PM-7AM, please contact night-coverage [email or web address]  07/12/2020, 8:10 AM

## 2020-07-12 NOTE — ED Notes (Signed)
ED TO INPATIENT HANDOFF REPORT  Name/Age/Gender [name] Mathews 75 y.o. male  Code Status    Code Status Orders  (From admission, onward)         Start     Ordered   07/12/20 1120  Do not attempt resuscitation (DNR)  Continuous       Question Answer Comment  In the event of cardiac or respiratory ARREST Do not call a "code blue"   In the event of cardiac or respiratory ARREST Do not perform Intubation, CPR, defibrillation or ACLS   In the event of cardiac or respiratory ARREST Use medication by any route, position, wound care, and other measures to relive pain and suffering. May use oxygen, suction and manual treatment of airway obstruction as needed for comfort.   Comments DNR confirmed with patient 07/12/20      07/12/20 1120        Code Status History    Date Active Date Inactive Code Status Order ID Comments User Context   05/25/2020 1939 05/26/2020 2104 DNR [id number]  Hillary Bow, DO ED   03/07/2020 1512 03/09/2020 2253 DNR [id number]  Jae Dire, MD ED   Advance Care Planning Activity      Home/SNF/Other Home  Chief Complaint Dyspnea [R06.00]  Level of Care/Admitting Diagnosis ED Disposition    ED Disposition Condition Comment   Admit  Hospital Area: [facility] [[id number]]  Level of Care: Telemetry [5]  Admit to tele based on following criteria: Monitor for Ischemic changes  May admit patient to [facility] or [facility] if equivalent level of care is available:: No  Covid Evaluation: Symptomatic Person Under Investigation (PUI)  Diagnosis: Dyspnea [616073]  Admitting Physician: Teddy Spike [[id number]]  Attending Physician: Teddy Spike [[id number]]  Estimated length of stay: past midnight tomorrow  Certification:: I certify this patient will need inpatient services for at least 2 midnights       Medical History Past Medical History:  Diagnosis Date  . Hyperlipidemia   . Hypertension   . Prostate cancer (HCC)    metastasis  bones  . Restless leg syndrome     Allergies Allergies  Allergen Reactions  . Bee Venom Anaphylaxis, Shortness Of Breath and Swelling    Tongue swelling.   . Wasp Venom Anaphylaxis, Shortness Of Breath and Swelling    Tongue swelling   . Percocet [Oxycodone-Acetaminophen] Palpitations    IV Location/Drains/Wounds Patient Lines/Drains/Airways Status    Active Line/Drains/Airways    Name Placement date Placement time Site Days   Implanted Port 01/11/19 Right Chest 01/11/19  1502  Chest  548   External Urinary Catheter 07/12/20  0345  -  less than 1          Labs/Imaging Results for orders placed or performed during the hospital encounter of 07/12/20 (from the past 48 hour(s))  SARS CORONAVIRUS 2 (TAT 6-24 HRS) Nasopharyngeal Nasopharyngeal Swab     Status: None   Collection Time: 07/12/20  5:23 AM   Specimen: Nasopharyngeal Swab  Result Value Ref Range   SARS Coronavirus 2 NEGATIVE NEGATIVE    Comment: (NOTE) SARS-CoV-2 target nucleic acids are NOT DETECTED.  The SARS-CoV-2 RNA is generally detectable in upper and lower respiratory specimens during the acute phase of infection. Negative results do not preclude SARS-CoV-2 infection, do not rule out co-infections with other pathogens, and should not be used as the sole basis for treatment or other patient management decisions. Negative results must be combined with  clinical observations, patient history, and epidemiological information. The expected result is Negative.  Fact Sheet for Patients: SugarRoll.be  Fact Sheet for Healthcare Providers: https://www.woods-mathews.com/  This test is not yet approved or cleared by the [place] FDA and  has been authorized for detection and/or diagnosis of SARS-CoV-2 by FDA under an Emergency Use Authorization (EUA). This EUA will remain  in effect (meaning this test can be used) for the duration of the COVID-19 declaration under Se  ction 564(b)(1) of the Act, 21 U.S.C. section 360bbb-3(b)(1), unless the authorization is terminated or revoked sooner.  Performed at [facility], [place] [address], [place], [place] [address]   CBC with Differential/Platelet     Status: Abnormal   Collection Time: 07/12/20  5:23 AM  Result Value Ref Range   WBC 16.1 (H) 4.0 - 10.5 K/uL   RBC 3.94 (L) 4.22 - 5.81 MIL/uL   Hemoglobin 9.9 (L) 13.0 - 17.0 g/dL   HCT 35.0 (L) 39.0 - 52.0 %   MCV 88.8 80.0 - 100.0 fL   MCH 25.1 (L) 26.0 - 34.0 pg   MCHC 28.3 (L) 30.0 - 36.0 g/dL   RDW 18.3 (H) 11.5 - 15.5 %   Platelets 371 150 - 400 K/uL   nRBC 0.0 0.0 - 0.2 %   Neutrophils Relative % 86 %   Neutro Abs 13.9 (H) 1.7 - 7.7 K/uL   Lymphocytes Relative 5 %   Lymphs Abs 0.8 0.7 - 4.0 K/uL   Monocytes Relative 8 %   Monocytes Absolute 1.3 (H) 0.1 - 1.0 K/uL   Eosinophils Relative 0 %   Eosinophils Absolute 0.0 0.0 - 0.5 K/uL   Basophils Relative 0 %   Basophils Absolute 0.0 0.0 - 0.1 K/uL   Immature Granulocytes 1 %   Abs Immature Granulocytes 0.08 (H) 0.00 - 0.07 K/uL    Comment: Performed at [facility], [place] [address] [place], [place] [id number]  Basic metabolic panel     Status: Abnormal   Collection Time: 07/12/20  5:23 AM  Result Value Ref Range   Sodium 144 135 - 145 mmol/L   Potassium 4.0 3.5 - 5.1 mmol/L   Chloride 104 98 - 111 mmol/L   CO2 31 22 - 32 mmol/L   Glucose, Bld 107 (H) 70 - 99 mg/dL    Comment: Glucose reference range applies only to samples taken after fasting for at least 8 hours.   BUN 15 8 - 23 mg/dL   Creatinine, Ser 0.84 0.61 - 1.24 mg/dL   Calcium 8.2 (L) 8.9 - 10.3 mg/dL   GFR, Estimated >60 >60 mL/min    Comment: (NOTE) Calculated using the CKD-EPI Creatinine Equation (2021)    Anion gap 9 5 - 15    Comment: Performed at [facility], [place] [address] [place], [place] [address]  D-dimer, quantitative (not at [facility])     Status: Abnormal   Collection Time:  07/12/20  5:23 AM  Result Value Ref Range   D-Dimer, Quant 4.50 (H) 0.00 - 0.50 ug/mL-FEU    Comment: (NOTE) At the manufacturer cut-off value of 0.5 g/mL FEU, this assay has a negative predictive value of 95-100%.This assay is intended for use in conjunction with a clinical pretest probability (PTP) assessment model to exclude pulmonary embolism (PE) and deep venous thrombosis (DVT) in outpatients suspected of PE or DVT. Results should be correlated with clinical presentation. Performed at [facility], [address] [place], [place]  [address]   MRSA PCR Screening     Status: None   Collection Time: 07/12/20  9:43 AM   Specimen: Nasopharyngeal  Result Value Ref Range   MRSA by PCR NEGATIVE NEGATIVE    Comment:        The GeneXpert MRSA Assay (FDA approved for NASAL specimens only), is one component of a comprehensive MRSA colonization surveillance program. It is not intended to diagnose MRSA infection nor to guide or monitor treatment for MRSA infections. Performed at [facility], [place] [address] [place], [place] [address]   Strep pneumoniae urinary antigen     Status: None   Collection Time: 07/12/20  9:45 AM  Result Value Ref Range   Strep Pneumo Urinary Antigen NEGATIVE NEGATIVE    Comment:        Infection due to S. pneumoniae cannot be absolutely ruled out since the antigen present may be below the detection limit of the test. Performed at [facility], [address] [place], [place] [address]   Lactic acid, plasma     Status: None   Collection Time: 07/12/20 11:45 AM  Result Value Ref Range   Lactic Acid, Venous 1.0 0.5 - 1.9 mmol/L    Comment: Performed at [facility], [place] [address], [place], [place] [address]   DG Chest 2 View  Result Date: 07/11/2020 CLINICAL DATA:  Shortness of breath EXAM: CHEST - 2 VIEW COMPARISON:  May 25, 2020 FINDINGS: The heart size and mediastinal contours  are within normal limits. Again noted are multifocal areas of airspace opacities and nodular opacities. There are bilateral pleural effusions present. Diffuse sclerosis seen throughout the visualized osseous structures. A right-sided MediPort catheter seen with the tip at the superior cavoatrial junction. IMPRESSION: No significant change in the multifocal airspace opacities, likely combination of consolidation and pulmonary nodules. Unchanged up bilateral pleural effusions Electronically Signed   By: Prudencio Pair M.D.   On: 07/11/2020 22:33   CT ANGIO CHEST PE W OR WO CONTRAST  Result Date: 07/12/2020 CLINICAL DATA:  Shortness of breath. EXAM: CT ANGIOGRAPHY CHEST WITH CONTRAST TECHNIQUE: Multidetector CT imaging of the chest was performed using the standard protocol during bolus administration of intravenous contrast. Multiplanar CT image reconstructions and MIPs were obtained to evaluate the vascular anatomy. CONTRAST:  38mL OMNIPAQUE IOHEXOL 350 MG/ML SOLN COMPARISON:  May 25, 2020 FINDINGS: Cardiovascular: A right-sided venous Port-A-Cath is seen. Mild to moderate severity calcification of the aortic arch is noted. Satisfactory opacification of the pulmonary arteries to the segmental level. No evidence of pulmonary embolism. Normal heart size. No pericardial effusion. Mediastinum/Nodes: No enlarged mediastinal, hilar, or axillary lymph nodes. Thyroid gland, trachea, and esophagus demonstrate no significant findings. Lungs/Pleura: Marked severity infiltrates are seen throughout both lungs. This is increased in severity when compared to the prior study. Predominant stable moderate sized bilateral pleural effusions are noted. No pneumothorax is identified. Upper Abdomen: No acute abnormality. Musculoskeletal: Multiple sclerotic foci of various sizes are seen scattered throughout the osseous skeleton. Review of the MIP images confirms the above findings. IMPRESSION: 1. Marked severity infiltrates throughout  both lungs. 2. Moderate sized bilateral pleural effusions. 3. Multiple sclerotic foci of various sizes scattered throughout the osseous skeleton, consistent with osseous metastasis. 4. Aortic atherosclerosis. Aortic Atherosclerosis (ICD10-I70.0). Electronically Signed   By: Virgina Norfolk M.D.   On: 07/12/2020 15:16   VAS [place] LOWER EXTREMITY VENOUS (DVT)  Result Date: 07/12/2020  Lower Venous DVT Study Indications: SOB.  Risk  Factors: Cancer Metastatic CA COPD on home O2. Performing Technologist: Rogelia Rohrer  Examination Guidelines: A complete evaluation includes B-mode imaging, spectral Doppler, color Doppler, and power Doppler as needed of all accessible portions of each vessel. Bilateral testing is considered an integral part of a complete examination. Limited examinations for reoccurring indications may be performed as noted. The reflux portion of the exam is performed with the patient in reverse Trendelenburg.  +---------+---------------+---------+-----------+----------+--------------+ RIGHT    CompressibilityPhasicitySpontaneityPropertiesThrombus Aging +---------+---------------+---------+-----------+----------+--------------+ CFV      Full           Yes      Yes                                 +---------+---------------+---------+-----------+----------+--------------+ SFJ      Full                                                        +---------+---------------+---------+-----------+----------+--------------+ FV Prox  Full           Yes      Yes                                 +---------+---------------+---------+-----------+----------+--------------+ FV Mid   Full           Yes      Yes                                 +---------+---------------+---------+-----------+----------+--------------+ FV DistalFull           Yes      Yes                                 +---------+---------------+---------+-----------+----------+--------------+ PFV      Full                                                         +---------+---------------+---------+-----------+----------+--------------+ POP      Full           Yes      Yes                                 +---------+---------------+---------+-----------+----------+--------------+ PTV      Full                                                        +---------+---------------+---------+-----------+----------+--------------+ PERO     Full                                                        +---------+---------------+---------+-----------+----------+--------------+   +---------+---------------+---------+-----------+----------+--------------+  LEFT     CompressibilityPhasicitySpontaneityPropertiesThrombus Aging +---------+---------------+---------+-----------+----------+--------------+ CFV      Full           Yes      Yes                                 +---------+---------------+---------+-----------+----------+--------------+ SFJ      Full                                                        +---------+---------------+---------+-----------+----------+--------------+ FV Prox  Full           Yes      Yes                                 +---------+---------------+---------+-----------+----------+--------------+ FV Mid   Full           Yes      Yes                                 +---------+---------------+---------+-----------+----------+--------------+ FV DistalFull           Yes      Yes                                 +---------+---------------+---------+-----------+----------+--------------+ PFV      Full                                                        +---------+---------------+---------+-----------+----------+--------------+ POP      Full           Yes      Yes                                 +---------+---------------+---------+-----------+----------+--------------+ PTV      Full                                                         +---------+---------------+---------+-----------+----------+--------------+ PERO     Full                                                        +---------+---------------+---------+-----------+----------+--------------+     Summary: BILATERAL: - No evidence of deep vein thrombosis seen in the lower extremities, bilaterally. -No evidence of popliteal cyst, bilaterally.   *See table(s) above for measurements and observations. Electronically signed by Servando Snare MD on 07/12/2020 at 2:26:53 PM.    Final     Pending Labs Unresulted Labs (From admission, onward)  Start     Ordered   07/13/20 0500  Protime-INR  Tomorrow morning,   R        07/12/20 1120   07/13/20 0500  Cortisol-am, blood  Tomorrow morning,   R        07/12/20 1120   07/13/20 0500  Procalcitonin  Tomorrow morning,   R        07/12/20 1120   07/12/20 1120  Culture, blood (routine x 2)  BLOOD CULTURE X 2,   R (with STAT occurrences)      07/12/20 1120   07/12/20 0945  Legionella Pneumophila Serogp 1 Ur Ag  Once,   STAT        07/12/20 0944          Vitals/Pain Today's Vitals   07/12/20 0951 07/12/20 1100 07/12/20 1130 07/12/20 1454  BP:  136/88 138/80 (!) 152/92  Pulse:  94 89 98  Resp:  (!) 29 (!) 21 (!) 34  Temp:      TempSrc:      SpO2:  97% 100% 96%  Weight: 62.6 kg     Height: 5\' 5"  (1.651 m)     PainSc:        Isolation Precautions Airborne and Contact precautions  Medications Medications  albuterol (VENTOLIN HFA) 108 (90 Base) MCG/ACT inhaler 2 puff (2 puffs Inhalation Given 07/12/20 1352)  mirtazapine (REMERON) tablet 15 mg (has no administration in time range)  gabapentin (NEURONTIN) capsule 300 mg (has no administration in time range)  feeding supplement (ENSURE ENLIVE / ENSURE PLUS) liquid 237 mL (has no administration in time range)  heparin injection 5,000 Units (5,000 Units Subcutaneous Given 07/12/20 1353)  azithromycin (ZITHROMAX) 500 mg in sodium chloride 0.9 % 250 mL IVPB (500 mg  Intravenous New Bag/Given 07/12/20 1154)  acetaminophen (TYLENOL) tablet 650 mg (has no administration in time range)    Or  acetaminophen (TYLENOL) suppository 650 mg (has no administration in time range)  ondansetron (ZOFRAN) tablet 4 mg (has no administration in time range)    Or  ondansetron (ZOFRAN) injection 4 mg (has no administration in time range)  Ipratropium-Albuterol (COMBIVENT) respimat 1 puff (has no administration in time range)  ceFEPIme (MAXIPIME) 2 g in sodium chloride 0.9 % 100 mL IVPB (has no administration in time range)  ceFEPIme (MAXIPIME) 2 g in sodium chloride 0.9 % 100 mL IVPB (0 g Intravenous Stopped 07/12/20 1032)  iohexol (OMNIPAQUE) 350 MG/ML injection 75 mL (75 mLs Intravenous Contrast Given 07/12/20 1425)    Mobility walks

## 2020-07-12 NOTE — ED Notes (Signed)
Pt transported to CT ?

## 2020-07-12 NOTE — ED Notes (Signed)
Attempted report x 1, no answer 

## 2020-07-12 NOTE — Progress Notes (Signed)
Pharmacy Antibiotic Note  Damyan Corne is a 75 y.o. male admitted on 07/12/2020 with SOB.  Pharmacy has been consulted for cefepime dosing for PNA.  Today, 07/12/20  WBC 16.1 - elevated  SCr 0.84, WNL. CrCl ~63 mL/min using TBW = 63 kg obtained from outpatient notes. Ht/wt order entered  Afebrile  Plan:  Cefepime 2 g IV q8h  Follow renal function for necessary dose adjustments    Temp (24hrs), Avg:98.8 F (37.1 C), Min:98.6 F (37 C), Max:98.9 F (37.2 C)  Recent Labs  Lab 07/12/20 0523  WBC 16.1*  CREATININE 0.84    CrCl cannot be calculated (Unknown ideal weight.).    Allergies  Allergen Reactions  . Bee Venom Anaphylaxis, Shortness Of Breath and Swelling    Tongue swelling.   . Wasp Venom Anaphylaxis, Shortness Of Breath and Swelling    Tongue swelling   . Percocet [Oxycodone-Acetaminophen] Palpitations    Antimicrobials this admission: Cefepime 1/20 >>  Dose adjustments this admission:  Microbiology results:  Thank you for allowing pharmacy to be a part of this patient's care.  Lenis Noon, PharmD 07/12/2020 9:39 AM

## 2020-07-12 NOTE — ED Provider Notes (Addendum)
[name] DEPT Provider Note: Georgena Spurling, [name] Mathews, [name]ws  CSN: [id number] MRN: [id number] ARRIVAL: 07/11/20 at 2151 ROOM: [place] [facility]   HISTORY OF PRESENT ILLNESS  07/12/20 3:58 AM [name] is a 75 y.o. 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  He frequently has dyspnea on exertion but yesterday morning about 8 AM he experienced dyspnea at rest while lying in bed.  He has had a couple of additional episodes of this.  These were moderate to severe but resolved with use of his albuterol inhaler (he just got a new refill yesterday).  He has not had a cough or fever.  He feels back to baseline now.  He has an indwelling Foley due to prostate cancer.  Past Medical History:  Diagnosis Date  . Hyperlipidemia   . Hypertension   . Prostate cancer ([place])    metastasis bones  . Restless leg syndrome     Past Surgical History:  Procedure Laterality Date  . IR CV LINE INJECTION  02/15/2019  . IR IMAGING GUIDED PORT INSERTION  01/11/2019  . IR THORACENTESIS ASP PLEURAL SPACE W/IMG GUIDE  11/25/2019  . PELVIC LYMPH NODE DISSECTION     prostate remains intact    Family History  Problem Relation Age of Onset  . Heart attack Mother 23       died of heart attack  . Cervical cancer Sister 1  . Stroke Brother 1    Social History   Tobacco Use  . Smoking status: Former Smoker    Packs/day: 0.25    Years: 6.00    Pack years: 1.50    Start date: 1969    Quit date: 1975    Years since quitting: 47.0  . Smokeless tobacco: Never Used  Vaping Use  . Vaping Use: Never used  Substance Use Topics  . Alcohol use: No  . Drug use: No    Prior to Admission medications   Medication Sig Start Date End Date Taking? Authorizing Provider  albuterol (VENTOLIN HFA) 108 (90 Base) MCG/ACT inhaler Inhale 1-2 puffs into the lungs every 6 (six) hours as needed for wheezing or shortness of  breath. 01/23/20  Yes Olalere, Adewale A, [name]  denosumab (XGEVA) 120 MG/1.7ML SOLN injection Inject 120 mg into the skin every 30 (thirty) days.   Yes Provider, Historical, [name]  doxycycline (VIBRAMYCIN) 100 MG capsule Take 1 capsule (100 mg total) by mouth 2 (two) times daily. One po bid x 7 days 07/12/20  Yes Shayn Madole, [name]  gabapentin (NEURONTIN) 300 MG capsule Take 1 capsule (300 mg total) by mouth at bedtime. 08/17/19  Yes [name], [name]  leuprolide (LUPRON) 30 MG injection Inject 30 mg into the muscle every 4 (four) months.   Yes Provider, Historical, [name]  mirtazapine (REMERON) 15 MG tablet TAKE 1 TABLET BY MOUTH AT BEDTIME 07/11/20  Yes Paige, Victoria J, DO  Nutritional Supplements (ENSURE ACTIVE HIGH PROTEIN) LIQD Use 3 cans daily. Patient taking differently: Take 1 Can by mouth in the morning and at bedtime. . 01/31/20  Yes [name], [name]  dexamethasone (DECADRON) 4 MG tablet Take 4 mg twice a day. Patient not taking: Reported on 07/12/2020 04/24/20   [name], [name]    Allergies Bee venom, Wasp venom, and Percocet [oxycodone-acetaminophen]   REVIEW OF SYSTEMS  Negative except as noted here or in the History of Present Illness.  PHYSICAL EXAMINATION  Initial Vital Signs Blood pressure (!) 148/85, pulse (!) 103, temperature 98.9 F (37.2 C), temperature source Oral, resp. rate 20, SpO2 90 %.  Examination General: Well-developed, well-nourished male in no acute distress; appearance consistent with age of record HENT: normocephalic; atraumatic Eyes: pupils equal, round and reactive to light; extraocular muscles intact; bilateral pseudophakia Neck: supple Heart: regular rate and rhythm Lungs: Distant sounds; shallow breaths Abdomen: soft; nondistended; nontender; bowel sounds present GU: Indwelling Foley Extremities: No deformity; full range of motion; pulses normal Neurologic: Awake, alert and oriented; motor function intact in all extremities and symmetric; no facial  droop Skin: Warm and dry Psychiatric: Normal mood and affect   RESULTS  Summary of this visit's results, reviewed and interpreted by myself:   EKG Interpretation  Date/Time:  Wednesday July 11 2020 22:28:31 EST Ventricular Rate:  115 PR Interval:    QRS Duration: 76 QT Interval:  314 QTC Calculation: 435 R Axis:   43 Text Interpretation: Sinus tachycardia No significant change was found Confirmed by Shanon Rosser [phone]) on 07/12/2020 4:48:14 AM      Laboratory Studies: Results for orders placed or performed during the hospital encounter of 07/12/20 (from the past 24 hour(s))  CBC with Differential/Platelet     Status: Abnormal   Collection Time: 07/12/20  5:23 AM  Result Value Ref Range   WBC 16.1 (H) 4.0 - 10.5 K/uL   RBC 3.94 (L) 4.22 - 5.81 MIL/uL   Hemoglobin 9.9 (L) 13.0 - 17.0 g/dL   HCT 35.0 (L) 39.0 - 52.0 %   MCV 88.8 80.0 - 100.0 fL   MCH 25.1 (L) 26.0 - 34.0 pg   MCHC 28.3 (L) 30.0 - 36.0 g/dL   RDW 18.3 (H) 11.5 - 15.5 %   Platelets 371 150 - 400 K/uL   nRBC 0.0 0.0 - 0.2 %   Neutrophils Relative % 86 %   Neutro Abs 13.9 (H) 1.7 - 7.7 K/uL   Lymphocytes Relative 5 %   Lymphs Abs 0.8 0.7 - 4.0 K/uL   Monocytes Relative 8 %   Monocytes Absolute 1.3 (H) 0.1 - 1.0 K/uL   Eosinophils Relative 0 %   Eosinophils Absolute 0.0 0.0 - 0.5 K/uL   Basophils Relative 0 %   Basophils Absolute 0.0 0.0 - 0.1 K/uL   Immature Granulocytes 1 %   Abs Immature Granulocytes 0.08 (H) 0.00 - 0.07 K/uL  Basic metabolic panel     Status: Abnormal   Collection Time: 07/12/20  5:23 AM  Result Value Ref Range   Sodium 144 135 - 145 mmol/L   Potassium 4.0 3.5 - 5.1 mmol/L   Chloride 104 98 - 111 mmol/L   CO2 31 22 - 32 mmol/L   Glucose, Bld 107 (H) 70 - 99 mg/dL   BUN 15 8 - 23 mg/dL   Creatinine, Ser 0.84 0.61 - 1.24 mg/dL   Calcium 8.2 (L) 8.9 - 10.3 mg/dL   GFR, Estimated >60 >60 mL/min   Anion gap 9 5 - 15  D-dimer, quantitative (not at [facility])     Status: Abnormal    Collection Time: 07/12/20  5:23 AM  Result Value Ref Range   D-Dimer, Quant 4.50 (H) 0.00 - 0.50 ug/mL-FEU   Imaging Studies: DG Chest 2 View  Result Date: 07/11/2020 CLINICAL DATA:  Shortness of breath EXAM: CHEST - 2 VIEW COMPARISON:  May 25, 2020 FINDINGS: The heart size and mediastinal contours are within normal limits. Again noted are multifocal areas  of airspace opacities and nodular opacities. There are bilateral pleural effusions present. Diffuse sclerosis seen throughout the visualized osseous structures. A right-sided MediPort catheter seen with the tip at the superior cavoatrial junction. IMPRESSION: No significant change in the multifocal airspace opacities, likely combination of consolidation and pulmonary nodules. Unchanged up bilateral pleural effusions Electronically Signed   By: Prudencio Pair M.D.   On: 07/11/2020 22:33    ED COURSE and MDM  Nursing notes, initial and subsequent vitals signs, including pulse oximetry, reviewed and interpreted by myself.  Vitals:   07/12/20 0400 07/12/20 0530 07/12/20 0600 07/12/20 0730  BP: 125/82 126/80 138/85 (!) 127/94  Pulse: 93 91 96 98  Resp: 14 12 (!) 22 (!) 33  Temp:      TempSrc:      SpO2: 97% 100% 94% 95%   Medications  albuterol (VENTOLIN HFA) 108 (90 Base) MCG/ACT inhaler 2 puff (2 puffs Inhalation Given 07/12/20 0525)    4:45 AM Patient's dyspnea has returned.  He is tachypneic and does not feel he is safe to go home.  PROCEDURES  Procedures   ED DIAGNOSES     ICD-10-CM   1. Shortness of breath  R06.02        Shanon Rosser, [name] 07/12/20 0409    Shanon Rosser, [name] 07/12/20 [phone]

## 2020-07-12 NOTE — CV Procedure (Signed)
BLE venous duplex completed  Results can be found under chart review under CV PROC. 07/12/2020 12:55 PM Elaya Droege RVT, RDMS

## 2020-07-13 DIAGNOSIS — R0602 Shortness of breath: Secondary | ICD-10-CM

## 2020-07-13 DIAGNOSIS — J189 Pneumonia, unspecified organism: Secondary | ICD-10-CM

## 2020-07-13 LAB — CBC WITH DIFFERENTIAL/PLATELET
Abs Immature Granulocytes: 0.03 10*3/uL (ref 0.00–0.07)
Basophils Absolute: 0 10*3/uL (ref 0.0–0.1)
Basophils Relative: 0 %
Eosinophils Absolute: 0 10*3/uL (ref 0.0–0.5)
Eosinophils Relative: 0 %
HCT: 34.4 % — ABNORMAL LOW (ref 39.0–52.0)
Hemoglobin: 9.8 g/dL — ABNORMAL LOW (ref 13.0–17.0)
Immature Granulocytes: 0 %
Lymphocytes Relative: 10 %
Lymphs Abs: 1 10*3/uL (ref 0.7–4.0)
MCH: 25.7 pg — ABNORMAL LOW (ref 26.0–34.0)
MCHC: 28.5 g/dL — ABNORMAL LOW (ref 30.0–36.0)
MCV: 90.1 fL (ref 80.0–100.0)
Monocytes Absolute: 1 10*3/uL (ref 0.1–1.0)
Monocytes Relative: 10 %
Neutro Abs: 8.1 10*3/uL — ABNORMAL HIGH (ref 1.7–7.7)
Neutrophils Relative %: 80 %
Platelets: 341 10*3/uL (ref 150–400)
RBC: 3.82 MIL/uL — ABNORMAL LOW (ref 4.22–5.81)
RDW: 18.5 % — ABNORMAL HIGH (ref 11.5–15.5)
WBC: 10.1 10*3/uL (ref 4.0–10.5)
nRBC: 0 % (ref 0.0–0.2)

## 2020-07-13 LAB — COMPREHENSIVE METABOLIC PANEL
ALT: 8 U/L (ref 0–44)
AST: 20 U/L (ref 15–41)
Albumin: 2.6 g/dL — ABNORMAL LOW (ref 3.5–5.0)
Alkaline Phosphatase: 68 U/L (ref 38–126)
Anion gap: 11 (ref 5–15)
BUN: 13 mg/dL (ref 8–23)
CO2: 30 mmol/L (ref 22–32)
Calcium: 8.2 mg/dL — ABNORMAL LOW (ref 8.9–10.3)
Chloride: 102 mmol/L (ref 98–111)
Creatinine, Ser: 0.64 mg/dL (ref 0.61–1.24)
GFR, Estimated: >60 mL/min (ref >60)
Glucose, Bld: 94 mg/dL (ref 70–99)
Potassium: 4.2 mmol/L (ref 3.5–5.1)
Sodium: 143 mmol/L (ref 135–145)
Total Bilirubin: 0.5 mg/dL (ref 0.3–1.2)
Total Protein: 6.1 g/dL — ABNORMAL LOW (ref 6.5–8.1)

## 2020-07-13 LAB — PROTIME-INR
INR: 1 (ref 0.8–1.2)
Prothrombin Time: 13 seconds (ref 11.4–15.2)

## 2020-07-13 LAB — PROCALCITONIN: Procalcitonin: <0.1 ng/mL

## 2020-07-13 LAB — LEGIONELLA PNEUMOPHILA SEROGP 1 UR AG: L. pneumophila Serogp 1 Ur Ag: NEGATIVE

## 2020-07-13 LAB — CORTISOL-AM, BLOOD: Cortisol - AM: 19.9 ug/dL (ref 6.7–22.6)

## 2020-07-13 MED ORDER — IPRATROPIUM-ALBUTEROL 0.5-2.5 (3) MG/3ML IN SOLN: 3.0000 mL | Freq: Four times a day (QID) | RESPIRATORY_TRACT | Status: DC

## 2020-07-13 MED ORDER — IPRATROPIUM-ALBUTEROL 0.5-2.5 (3) MG/3ML IN SOLN
3.0000 mL | Freq: Four times a day (QID) | RESPIRATORY_TRACT | Status: DC | PRN
  Administered 2020-07-13 – 2020-07-14 (×2): 3 mL via RESPIRATORY_TRACT
  Filled 2020-07-13 (×2): qty 3

## 2020-07-13 MED ORDER — GUAIFENESIN ER 600 MG PO TB12
1200.0000 mg | ORAL_TABLET | Freq: Two times a day (BID) | ORAL | Status: DC
Start: 2020-07-13 — End: 2020-07-16
  Administered 2020-07-13 – 2020-07-16 (×6): 1200 mg via ORAL
  Filled 2020-07-13 (×6): qty 2

## 2020-07-13 MED ORDER — IPRATROPIUM-ALBUTEROL 0.5-2.5 (3) MG/3ML IN SOLN
3.0000 mL | Freq: Four times a day (QID) | RESPIRATORY_TRACT | Status: DC | PRN
  Administered 2020-07-13: 3 mL via RESPIRATORY_TRACT
  Filled 2020-07-13: qty 3

## 2020-07-13 NOTE — Progress Notes (Signed)
[facility]  PROGRESS NOTE  [name] [id number] DOB: [birth year] DOA: 07/12/2020 PCP: Shary Key, DO   Brief HPI:   75 year old male with medical history of metastatic prostate cancer with mets to lung presents with dyspnea.  Patient said that he took a steroid pill and albuterol.  After about half an hour his symptoms improved a little.  As patient continued to have shortness of breath so he came to ED for further evaluation.  Chest x-ray showed multiple opacities in both lung fields.    Subjective   Patient seen and examined, denies shortness of breath.  Admits to coughing up yellow-colored phlegm.   Assessment/Plan:     1. 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  Patient started on cefepime, Zithromax.  We will start Mucinex 1200 mg p.o. twice daily, flutter valve every 4 hours.  We will change DuoNeb nebulizers to scheduled every 6 hours.  SARS-CoV-2 RT-PCR is negative. 2. History of prostate cancer with lung metastasis-patient followed with oncology as outpatient 3. Moderate protein calorie malnutrition-dietitian consulted 4. D-dimer elevation-D-dimer is elevated at 4.50, CTA chest was negative for PE, lower extremity venous duplex was negative for DVT.      COVID-19 Labs  Recent Labs    07/12/20 0523  DDIMER 4.50*    Lab Results  Component Value Date   SARSCOV2NAA NEGATIVE 07/12/2020   SARSCOV2NAA NEGATIVE 05/26/2020   SARSCOV2NAA NEGATIVE 03/07/2020   [place] NEGATIVE 11/23/2019     Scheduled medications:   . Chlorhexidine Gluconate Cloth  6 each Topical Daily  . feeding supplement  237 mL Oral BID  . gabapentin  300 mg Oral QHS  . heparin  5,000 Units Subcutaneous Q8H  . mirtazapine  15 mg Oral QHS         CBG: No results for input(s): GLUCAP in the last 168 hours.  SpO2: 98 % O2 Flow Rate (L/min): 3 L/min    CBC: Recent Labs  Lab  07/12/20 0523 07/13/20 1100  WBC 16.1* 10.1  NEUTROABS 13.9* 8.1*  HGB 9.9* 9.8*  HCT 35.0* 34.4*  MCV 88.8 90.1  PLT 371 [id number]    Basic Metabolic Panel: Recent Labs  Lab 07/12/20 0523 07/13/20 1100  NA 144 143  K 4.0 4.2  CL 104 102  CO2 31 30  GLUCOSE 107* 94  BUN 15 13  CREATININE 0.84 0.64  CALCIUM 8.2* 8.2*     Liver Function Tests: Recent Labs  Lab 07/13/20 1100  AST 20  ALT 8  ALKPHOS 68  BILITOT 0.5  PROT 6.1*  ALBUMIN 2.6*     Antibiotics: Anti-infectives (From admission, onward)   Start     Dose/Rate Route Frequency Ordered Stop   07/12/20 1800  ceFEPIme (MAXIPIME) 2 g in sodium chloride 0.9 % 100 mL IVPB        2 g 200 mL/hr over 30 Minutes Intravenous Every 8 hours 07/12/20 0947     07/12/20 1130  ceFEPIme (MAXIPIME) 2 g in sodium chloride 0.9 % 100 mL IVPB  Status:  Discontinued        2 g 200 mL/hr over 30 Minutes Intravenous  Once 07/12/20 1120 07/12/20 1143   07/12/20 1130  azithromycin (ZITHROMAX) 500 mg in sodium chloride 0.9 % 250 mL IVPB        500 mg 250 mL/hr over 60 Minutes Intravenous Every 24 hours 07/12/20 1120     07/12/20 0945  ceFEPIme (MAXIPIME)  2 g in sodium chloride 0.9 % 100 mL IVPB        2 g 200 mL/hr over 30 Minutes Intravenous  Once 07/12/20 0935 07/12/20 1032   07/12/20 0000  doxycycline (VIBRAMYCIN) 100 MG capsule        100 mg Oral 2 times daily 07/12/20 0431         DVT prophylaxis: Heparin  Code Status: DNR  Family Communication: No family at bedside   Consultants:    Procedures:      Objective   Vitals:   07/12/20 2216 07/13/20 0608 07/13/20 1204 07/13/20 1427  BP: 140/84 135/83  128/83  Pulse: 85 87  92  Resp: 16 16  18   Temp: 97.9 F (36.6 C) 98 F (36.7 C)  98.5 F (36.9 C)  TempSrc: Oral Oral  Oral  SpO2: 96% 97% 98% 98%  Weight:      Height:        Intake/Output Summary (Last 24 hours) at 07/13/2020 1634 Last data filed at 07/13/2020 0900 Gross per 24 hour  Intake 575 ml   Output 1500 ml  Net -925 ml    01/19 1901 - 01/21 0700 In: 685 [P.O.:235] Out: 1500 [Urine:1500]  Filed Weights   07/12/20 0951  Weight: 62.6 kg    Physical Examination:   General-appears in no acute distress Heart-S1-S2, regular, no murmur auscultated Lungs-bilateral rhonchi auscultated Abdomen-soft, nontender, no organomegaly Extremities-no edema in the lower extremities Neuro-alert, oriented x3, no focal deficit noted  Status is: Inpatient  Dispo: The patient is from: Home              Anticipated d/c is to: Home              Anticipated d/c date is: 07/16/2020              Patient currently not medically stable for discharge  Barrier to discharge-ongoing treatment for pneumonia       Data Reviewed:   Recent Results (from the past 240 hour(s))  SARS CORONAVIRUS 2 (TAT 6-24 HRS) Nasopharyngeal Nasopharyngeal Swab     Status: None   Collection Time: 07/12/20  5:23 AM   Specimen: Nasopharyngeal Swab  Result Value Ref Range Status   SARS Coronavirus 2 NEGATIVE NEGATIVE Final    Comment: (NOTE) SARS-CoV-2 target nucleic acids are NOT DETECTED.  The SARS-CoV-2 RNA is generally detectable in upper and lower respiratory specimens during the acute phase of infection. Negative results do not preclude SARS-CoV-2 infection, do not rule out co-infections with other pathogens, and should not be used as the sole basis for treatment or other patient management decisions. Negative results must be combined with clinical observations, patient history, and epidemiological information. The expected result is Negative.  Fact Sheet for Patients: SugarRoll.be  Fact Sheet for Healthcare Providers: https://www.woods-mathews.com/  This test is not yet approved or cleared by the [place] FDA and  has been authorized for detection and/or diagnosis of SARS-CoV-2 by FDA under an Emergency Use Authorization (EUA). This EUA will remain   in effect (meaning this test can be used) for the duration of the COVID-19 declaration under Se ction 564(b)(1) of the Act, 21 U.S.C. section 360bbb-3(b)(1), unless the authorization is terminated or revoked sooner.  Performed at [facility], [place] [address] [place], [place] [address]   MRSA PCR Screening     Status: None   Collection Time: 07/12/20  9:43 AM   Specimen: Nasopharyngeal  Result Value Ref Range Status  MRSA by PCR NEGATIVE NEGATIVE Final    Comment:        The GeneXpert MRSA Assay (FDA approved for NASAL specimens only), is one component of a comprehensive MRSA colonization surveillance program. It is not intended to diagnose MRSA infection nor to guide or monitor treatment for MRSA infections. Performed at [facility], [place] [address] [place], [place] [address]   Culture, blood (routine x 2)     Status: None (Preliminary result)   Collection Time: 07/12/20 11:45 AM   Specimen: BLOOD  Result Value Ref Range Status   Specimen Description   Final    BLOOD RIGHT ANTECUBITAL Performed at [facility] [address], [place], [place] [address]    Special Requests   Final    BOTTLES DRAWN AEROBIC AND ANAEROBIC Blood Culture adequate volume Performed at [facility] [address] [place], [place] [address]    Culture   Final    NO GROWTH < 24 HOURS Performed at [facility] [address] [place], [place] [address]    Report Status PENDING  Incomplete  Culture, blood (routine x 2)     Status: None (Preliminary result)   Collection Time: 07/12/20 11:45 AM   Specimen: BLOOD  Result Value Ref Range Status   Specimen Description   Final    BLOOD BLOOD RIGHT FOREARM Performed at [facility] [address] [place], [place] [address]    Special Requests   Final    BOTTLES DRAWN AEROBIC AND ANAEROBIC Blood Culture adequate volume Performed at [facility] [address] [place], [place] [address]    Culture   Final    NO GROWTH < 24 HOURS Performed at [facility] [address], [place], [place] [address]    Report Status PENDING  Incomplete    No results for input(s): LIPASE, AMYLASE in the last 168 hours. No results for input(s): AMMONIA in the last 168 hours.  Cardiac Enzymes: No results for input(s): CKTOTAL, CKMB, CKMBINDEX, TROPONINI in the last 168 hours. BNP (last 3 results) Recent Labs    03/07/20 1210  BNP 87.6    ProBNP (last 3 results) Recent Labs    03/06/20 1504  PROBNP 78.0    Studies:  DG Chest 2 View  Result Date: 07/11/2020 CLINICAL DATA:  Shortness of breath EXAM: CHEST - 2 VIEW COMPARISON:  May 25, 2020 FINDINGS: The heart size and mediastinal contours are within normal limits. Again noted are multifocal areas of airspace opacities and nodular opacities. There are bilateral pleural effusions present. Diffuse sclerosis seen throughout the visualized osseous structures. A right-sided MediPort catheter seen with the tip at the superior cavoatrial junction. IMPRESSION: No significant change in the multifocal airspace opacities, likely combination of consolidation and pulmonary nodules. Unchanged up bilateral pleural effusions Electronically Signed   By: Prudencio Pair M.D.   On: 07/11/2020 22:33   CT ANGIO CHEST PE W OR WO CONTRAST  Result Date: 07/12/2020 CLINICAL DATA:  Shortness of breath. EXAM: CT ANGIOGRAPHY CHEST WITH CONTRAST TECHNIQUE: Multidetector CT imaging of the chest was performed using the standard protocol during bolus administration of intravenous contrast. Multiplanar CT image reconstructions and MIPs were obtained to evaluate the vascular anatomy. CONTRAST:  54mL OMNIPAQUE IOHEXOL 350 MG/ML SOLN COMPARISON:  May 25, 2020 FINDINGS: Cardiovascular: A right-sided venous Port-A-Cath is seen. Mild to moderate severity calcification of the aortic arch is noted. Satisfactory  opacification of  the pulmonary arteries to the segmental level. No evidence of pulmonary embolism. Normal heart size. No pericardial effusion. Mediastinum/Nodes: No enlarged mediastinal, hilar, or axillary lymph nodes. Thyroid gland, trachea, and esophagus demonstrate no significant findings. Lungs/Pleura: Marked severity infiltrates are seen throughout both lungs. This is increased in severity when compared to the prior study. Predominant stable moderate sized bilateral pleural effusions are noted. No pneumothorax is identified. Upper Abdomen: No acute abnormality. Musculoskeletal: Multiple sclerotic foci of various sizes are seen scattered throughout the osseous skeleton. Review of the MIP images confirms the above findings. IMPRESSION: 1. Marked severity infiltrates throughout both lungs. 2. Moderate sized bilateral pleural effusions. 3. Multiple sclerotic foci of various sizes scattered throughout the osseous skeleton, consistent with osseous metastasis. 4. Aortic atherosclerosis. Aortic Atherosclerosis (ICD10-I70.0). Electronically Signed   By: Virgina Norfolk M.D.   On: 07/12/2020 15:16   VAS [place] LOWER EXTREMITY VENOUS (DVT)  Result Date: 07/12/2020  Lower Venous DVT Study Indications: SOB.  Risk Factors: Cancer Metastatic CA COPD on home O2. Performing Technologist: Rogelia Rohrer  Examination Guidelines: A complete evaluation includes B-mode imaging, spectral Doppler, color Doppler, and power Doppler as needed of all accessible portions of each vessel. Bilateral testing is considered an integral part of a complete examination. Limited examinations for reoccurring indications may be performed as noted. The reflux portion of the exam is performed with the patient in reverse Trendelenburg.  +---------+---------------+---------+-----------+----------+--------------+ RIGHT    CompressibilityPhasicitySpontaneityPropertiesThrombus Aging  +---------+---------------+---------+-----------+----------+--------------+ CFV      Full           Yes      Yes                                 +---------+---------------+---------+-----------+----------+--------------+ SFJ      Full                                                        +---------+---------------+---------+-----------+----------+--------------+ FV Prox  Full           Yes      Yes                                 +---------+---------------+---------+-----------+----------+--------------+ FV Mid   Full           Yes      Yes                                 +---------+---------------+---------+-----------+----------+--------------+ FV DistalFull           Yes      Yes                                 +---------+---------------+---------+-----------+----------+--------------+ PFV      Full                                                        +---------+---------------+---------+-----------+----------+--------------+ POP  Full           Yes      Yes                                 +---------+---------------+---------+-----------+----------+--------------+ PTV      Full                                                        +---------+---------------+---------+-----------+----------+--------------+ PERO     Full                                                        +---------+---------------+---------+-----------+----------+--------------+   +---------+---------------+---------+-----------+----------+--------------+ LEFT     CompressibilityPhasicitySpontaneityPropertiesThrombus Aging +---------+---------------+---------+-----------+----------+--------------+ CFV      Full           Yes      Yes                                 +---------+---------------+---------+-----------+----------+--------------+ SFJ      Full                                                         +---------+---------------+---------+-----------+----------+--------------+ FV Prox  Full           Yes      Yes                                 +---------+---------------+---------+-----------+----------+--------------+ FV Mid   Full           Yes      Yes                                 +---------+---------------+---------+-----------+----------+--------------+ FV DistalFull           Yes      Yes                                 +---------+---------------+---------+-----------+----------+--------------+ PFV      Full                                                        +---------+---------------+---------+-----------+----------+--------------+ POP      Full           Yes      Yes                                 +---------+---------------+---------+-----------+----------+--------------+ PTV      Full                                                        +---------+---------------+---------+-----------+----------+--------------+  PERO     Full                                                        +---------+---------------+---------+-----------+----------+--------------+     Summary: BILATERAL: - No evidence of deep vein thrombosis seen in the lower extremities, bilaterally. -No evidence of popliteal cyst, bilaterally.   *See table(s) above for measurements and observations. Electronically signed by Servando Snare MD on 07/12/2020 at 2:26:53 PM.    Final        Oswald Hillock   [facility] If 7PM-7AM, please contact night-coverage at [email or web address], Office  [phone]   07/13/2020, 4:34 PM  LOS: 1 day

## 2020-07-14 LAB — COMPREHENSIVE METABOLIC PANEL
ALT: 8 U/L (ref 0–44)
AST: 18 U/L (ref 15–41)
Albumin: 2.6 g/dL — ABNORMAL LOW (ref 3.5–5.0)
Alkaline Phosphatase: 63 U/L (ref 38–126)
Anion gap: 9 (ref 5–15)
BUN: 12 mg/dL (ref 8–23)
CO2: 32 mmol/L (ref 22–32)
Calcium: 8 mg/dL — ABNORMAL LOW (ref 8.9–10.3)
Chloride: 102 mmol/L (ref 98–111)
Creatinine, Ser: 0.62 mg/dL (ref 0.61–1.24)
GFR, Estimated: >60 mL/min (ref >60)
Glucose, Bld: 96 mg/dL (ref 70–99)
Potassium: 4.1 mmol/L (ref 3.5–5.1)
Sodium: 143 mmol/L (ref 135–145)
Total Bilirubin: 0.4 mg/dL (ref 0.3–1.2)
Total Protein: 6.2 g/dL — ABNORMAL LOW (ref 6.5–8.1)

## 2020-07-14 LAB — CBC
HCT: 34.1 % — ABNORMAL LOW (ref 39.0–52.0)
Hemoglobin: 9.5 g/dL — ABNORMAL LOW (ref 13.0–17.0)
MCH: 25.3 pg — ABNORMAL LOW (ref 26.0–34.0)
MCHC: 27.9 g/dL — ABNORMAL LOW (ref 30.0–36.0)
MCV: 90.9 fL (ref 80.0–100.0)
Platelets: 327 10*3/uL (ref 150–400)
RBC: 3.75 MIL/uL — ABNORMAL LOW (ref 4.22–5.81)
RDW: 18.4 % — ABNORMAL HIGH (ref 11.5–15.5)
WBC: 9.3 10*3/uL (ref 4.0–10.5)
nRBC: 0 % (ref 0.0–0.2)

## 2020-07-14 MED ORDER — ALPRAZOLAM 0.25 MG PO TABS
0.2500 mg | ORAL_TABLET | Freq: Once | ORAL | Status: AC
  Administered 2020-07-14: 0.25 mg via ORAL
  Filled 2020-07-14: qty 1

## 2020-07-14 MED ORDER — AZITHROMYCIN 250 MG PO TABS
500.0000 mg | ORAL_TABLET | Freq: Every day | ORAL | Status: DC
  Administered 2020-07-14 – 2020-07-16 (×3): 500 mg via ORAL
  Filled 2020-07-14 (×3): qty 2

## 2020-07-14 NOTE — Progress Notes (Signed)
PHARMACIST - PHYSICIAN COMMUNICATION  CONCERNING: Antibiotic IV to Oral Route Change Policy  RECOMMENDATION: This patient is receiving azithromycin by the intravenous route.  Based on criteria approved by the Pharmacy and Therapeutics Committee, the antibiotic(s) is/are being converted to the equivalent oral dose form(s).   DESCRIPTION: These criteria include:  Patient being treated for a respiratory tract infection, urinary tract infection, cellulitis or clostridium difficile associated diarrhea if on metronidazole  The patient is not neutropenic and does not exhibit a GI malabsorption state  The patient is eating (either orally or via tube) and/or has been taking other orally administered medications for a least 24 hours  The patient is improving clinically and has a Tmax < 100.5  If you have questions about this conversion, please contact the Pharmacy Department  []  ( [phone] )  [facility] []  ( [phone] )  [facility] []  ( [phone]  [facility] []  ( [phone] )  [facility] [x]  ( [phone] )  [facility]  

## 2020-07-14 NOTE — Evaluation (Addendum)
Clinical/Bedside Swallow Evaluation Patient Details  Name: [name] Mathews MRN: [id number] Date of Birth: Feb 27, 1946  Today's Date: 07/14/2020 Time: SLP Start Time (ACUTE ONLY): 1340 SLP Stop Time (ACUTE ONLY): 1405 SLP Time Calculation (min) (ACUTE ONLY): 25 min  Past Medical History:  Past Medical History:  Diagnosis Date  . Hyperlipidemia   . Hypertension   . Prostate cancer ([place])    metastasis bones  . Restless leg syndrome    Past Surgical History:  Past Surgical History:  Procedure Laterality Date  . IR CV LINE INJECTION  02/15/2019  . IR IMAGING GUIDED PORT INSERTION  01/11/2019  . IR THORACENTESIS ASP PLEURAL SPACE W/IMG GUIDE  11/25/2019  . PELVIC LYMPH NODE DISSECTION     prostate remains intact   HPI:  75 y.o. 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 Presented with dyspnea and pt  felt "a rattling" in his chest and was gasping for air on 07/10/20.   If he tried to move or even eat/drink, his breathing worsened.  PMHx includes hyperlipidemia and hypertension.  Thoracentesis completed last hospitalization 05/26/20.  CT chest on 07/12/20 indicated Marked severity infiltrates throughout both lungs. 2. Moderate sized bilateral pleural effusions. 3. Multiple sclerotic foci of various sizes scattered throughout the osseous skeleton, consistent with osseous metastasis. Pt is on 2L O2 at home at baseline. BSE generated.   Assessment / Plan / Recommendation Clinical Impression  Pt seen for bedside swallowing evaluation with consistencies attempted including ice chips, small sips of thin liquids, puree and regular with min verbal cueing provided for breathing regulation, pacing and small bites and sips during consumption.  Pt exhibited a mild exhalatory wheeze after intake, but this improved with breathing regulation, effortful swallow and repetitive swallows provided.  Education re: breathing/swallowing reciprocity provided to patient  with acknowledgement noted. Pt demonstrated a post-swallow inhalation pattern vs normal exhalation pattern.  Post-inhalation often noted with dyspneic patients during meal consumption. Discussed anxiety potentially playing a role in respiratory compromise with consumption and pt in agreement with this fact.  Recommend continue Regular diet/thin liquids with swallowing precautions/strategies mentioned above.  Thank you for this consult. SLP Visit Diagnosis: Dysphagia, unspecified (R13.10)    Aspiration Risk  Mild aspiration risk;Risk for inadequate nutrition/hydration    Diet Recommendation   Regular/thin liquids  Medication Administration: Other (Comment) (liquid/whole or puree/whole depending on respiratory status)    Other  Recommendations Oral Care Recommendations: Oral care BID   Follow up Recommendations None      Frequency and Duration min 2x/week  1 week       Prognosis Prognosis for Safe Diet Advancement: Good      Swallow Study   General Date of Onset: 07/12/20 HPI: 75 y.o. 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 Presenting with dyspnea. Reports that 2 days ago he felt "a rattling" in his chest and was gasping for air. His daughter helped him sit up to help give him more room to breathe. He took a steroid pill and some albuterol. After about an hour, he felt his symptoms improve a little. He states that he was able to sit in his recliner for the rest of the day. If he tried to move or even eat/drink, his breathing worsened. This continued through last night around 9 pm. At that time, he felt that he couldn't catch up on his breathing and called for EMS to bring him to the hospital. Type of Study:  Bedside Swallow Evaluation Diet Prior to this Study: Regular;Thin liquids Temperature Spikes Noted: No Respiratory Status: Nasal cannula (6L) History of Recent Intubation: No Behavior/Cognition: Alert;Cooperative;Pleasant mood Oral  Cavity Assessment: Within Functional Limits Oral Care Completed by SLP: No Oral Cavity - Dentition: Adequate natural dentition;Missing dentition Vision: Functional for self-feeding Self-Feeding Abilities: Able to feed self Patient Positioning: Upright in bed Baseline Vocal Quality: Low vocal intensity;Other (comment) (speaking in phrases prior to next breath) Volitional Cough: Strong Volitional Swallow: Able to elicit    Oral/Motor/Sensory Function Overall Oral Motor/Sensory Function: Within functional limits   Ice Chips Ice chips: Within functional limits Presentation: Spoon   Thin Liquid Thin Liquid: Within functional limits Presentation: Straw;Cup    Nectar Thick Nectar Thick Liquid: Not tested   Honey Thick Honey Thick Liquid: Not tested   Puree Puree: Impaired Presentation: Self Fed Pharyngeal Phase Impairments: Multiple swallows   Solid     Solid: Impaired Presentation: Self Fed Pharyngeal Phase Impairments: Multiple swallows Other Comments: used repetitive swallows, pacing, breathing regulation and this improved vocal quality; min exhalatory wheeze noted prior     ,  Elvina Sidle, M.S., CCC-SLP 07/14/2020,2:42 PM

## 2020-07-14 NOTE — Progress Notes (Signed)
[facility]  PROGRESS NOTE  [name] Mathews IOX:[id number] DOB: 1945-07-10 DOA: 07/12/2020 PCP: Shary Key, DO   Brief HPI:   75 year old male with medical history of metastatic prostate cancer with mets to lung presents with dyspnea.  Patient said that he took a steroid pill and albuterol.  After about half an hour his symptoms improved a little.  As patient continued to have shortness of breath so he came to ED for further evaluation.  Chest x-ray showed multiple opacities in both lung fields.    Subjective   Patient seen and examined, complains of coughing up phlegm after eating.  Though his breathing is improved.   Assessment/Plan:     1. 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  Patient started on cefepime, Zithromax.  Patient started on  Mucinex 1200 mg p.o. twice daily, flutter valve every 4 hours.  Continue DuoNeb nebulizers to scheduled every 6 hours.  SARS-CoV-2 RT-PCR is negative. 2. Multifocal pneumonia-concern for aspiration, will obtain swallow evaluation.  Continue antibiotics as above.  WBC is down to 9000. 3. History of prostate cancer with lung metastasis-patient followed with oncology as outpatient 4. Moderate protein calorie malnutrition-dietitian consulted 5. D-dimer elevation-D-dimer is elevated at 4.50, CTA chest was negative for PE, lower extremity venous duplex was negative for DVT.      COVID-19 Labs  Recent Labs    07/12/20 0523  DDIMER 4.50*    Lab Results  Component Value Date   SARSCOV2NAA NEGATIVE 07/12/2020   SARSCOV2NAA NEGATIVE 05/26/2020   SARSCOV2NAA NEGATIVE 03/07/2020   [place] NEGATIVE 11/23/2019     Scheduled medications:   . azithromycin  500 mg Oral Daily  . Chlorhexidine Gluconate Cloth  6 each Topical Daily  . feeding supplement  237 mL Oral BID  . gabapentin  300 mg Oral QHS  . guaiFENesin  1,200 mg Oral BID  . heparin  5,000  Units Subcutaneous Q8H  . mirtazapine  15 mg Oral QHS         CBG: No results for input(s): GLUCAP in the last 168 hours.  SpO2: 99 % O2 Flow Rate (L/min): 4 L/min    CBC: Recent Labs  Lab 07/12/20 0523 07/13/20 1100 07/14/20 0500  WBC 16.1* 10.1 9.3  NEUTROABS 13.9* 8.1*  --   HGB 9.9* 9.8* 9.5*  HCT 35.0* 34.4* 34.1*  MCV 88.8 90.1 90.9  PLT 371 341 268    Basic Metabolic Panel: Recent Labs  Lab 07/12/20 0523 07/13/20 1100 07/14/20 0500  NA 144 143 143  K 4.0 4.2 4.1  CL 104 102 102  CO2 31 30 32  GLUCOSE 107* 94 96  BUN 15 13 12   CREATININE 0.84 0.64 0.62  CALCIUM 8.2* 8.2* 8.0*     Liver Function Tests: Recent Labs  Lab 07/13/20 1100 07/14/20 0500  AST 20 18  ALT 8 8  ALKPHOS 68 63  BILITOT 0.5 0.4  PROT 6.1* 6.2*  ALBUMIN 2.6* 2.6*     Antibiotics: Anti-infectives (From admission, onward)   Start     Dose/Rate Route Frequency Ordered Stop   07/14/20 1115  azithromycin (ZITHROMAX) tablet 500 mg        500 mg Oral Daily 07/14/20 1015     07/12/20 1800  ceFEPIme (MAXIPIME) 2 g in sodium chloride 0.9 % 100 mL IVPB        2 g 200 mL/hr over 30 Minutes Intravenous Every 8 hours 07/12/20 0947  07/12/20 1130  ceFEPIme (MAXIPIME) 2 g in sodium chloride 0.9 % 100 mL IVPB  Status:  Discontinued        2 g 200 mL/hr over 30 Minutes Intravenous  Once 07/12/20 1120 07/12/20 1143   07/12/20 1130  azithromycin (ZITHROMAX) 500 mg in sodium chloride 0.9 % 250 mL IVPB  Status:  Discontinued        500 mg 250 mL/hr over 60 Minutes Intravenous Every 24 hours 07/12/20 1120 07/14/20 1015   07/12/20 0945  ceFEPIme (MAXIPIME) 2 g in sodium chloride 0.9 % 100 mL IVPB        2 g 200 mL/hr over 30 Minutes Intravenous  Once 07/12/20 0935 07/12/20 1032   07/12/20 0000  doxycycline (VIBRAMYCIN) 100 MG capsule        100 mg Oral 2 times daily 07/12/20 0431         DVT prophylaxis: Heparin  Code Status: DNR  Family Communication: No family at  bedside   Consultants:    Procedures:      Objective   Vitals:   07/13/20 2122 07/14/20 0549 07/14/20 1208 07/14/20 1332  BP: 116/71 (!) 151/91 (!) 159/86 (!) 155/92  Pulse: 90 96 95 92  Resp: 16  20 20   Temp: 99.2 F (37.3 C) 98.1 F (36.7 C)  98.3 F (36.8 C)  TempSrc: Oral Oral  Oral  SpO2: 92% 96% 99% 99%  Weight:      Height:        Intake/Output Summary (Last 24 hours) at 07/14/2020 1704 Last data filed at 07/13/2020 1819 Gross per 24 hour  Intake --  Output 450 ml  Net -450 ml    01/20 1901 - 01/22 0700 In: 480 [P.O.:480] Out: 1950 [Urine:1950]  Filed Weights   07/12/20 0951  Weight: 62.6 kg    Physical Examination:  General-appears in no acute distress Heart-S1-S2, regular, no murmur auscultated Lungs-bilateral rhonchi auscultated Abdomen-soft, nontender, no organomegaly Extremities-no edema in the lower extremities Neuro-alert, oriented x3, no focal deficit noted  Status is: Inpatient  Dispo: The patient is from: Home              Anticipated d/c is to: Home              Anticipated d/c date is: 07/16/2020              Patient currently not medically stable for discharge  Barrier to discharge-ongoing treatment for pneumonia       Data Reviewed:   Recent Results (from the past 240 hour(s))  SARS CORONAVIRUS 2 (TAT 6-24 HRS) Nasopharyngeal Nasopharyngeal Swab     Status: None   Collection Time: 07/12/20  5:23 AM   Specimen: Nasopharyngeal Swab  Result Value Ref Range Status   SARS Coronavirus 2 NEGATIVE NEGATIVE Final    Comment: (NOTE) SARS-CoV-2 target nucleic acids are NOT DETECTED.  The SARS-CoV-2 RNA is generally detectable in upper and lower respiratory specimens during the acute phase of infection. Negative results do not preclude SARS-CoV-2 infection, do not rule out co-infections with other pathogens, and should not be used as the sole basis for treatment or other patient management decisions. Negative results must be  combined with clinical observations, patient history, and epidemiological information. The expected result is Negative.  Fact Sheet for Patients: SugarRoll.be  Fact Sheet for Healthcare Providers: https://www.woods-mathews.com/  This test is not yet approved or cleared by the [place] FDA and  has been authorized for detection and/or diagnosis of  SARS-CoV-2 by FDA under an Emergency Use Authorization (EUA). This EUA will remain  in effect (meaning this test can be used) for the duration of the COVID-19 declaration under Se ction 564(b)(1) of the Act, 21 U.S.C. section 360bbb-3(b)(1), unless the authorization is terminated or revoked sooner.  Performed at [facility], [place] [address], [place], [place] [address]   MRSA PCR Screening     Status: None   Collection Time: 07/12/20  9:43 AM   Specimen: Nasopharyngeal  Result Value Ref Range Status   MRSA by PCR NEGATIVE NEGATIVE Final    Comment:        The GeneXpert MRSA Assay (FDA approved for NASAL specimens only), is one component of a comprehensive MRSA colonization surveillance program. It is not intended to diagnose MRSA infection nor to guide or monitor treatment for MRSA infections. Performed at [facility] [place], [place] [address] [place], [place] [address]   Culture, blood (routine x 2)     Status: None (Preliminary result)   Collection Time: 07/12/20 11:45 AM   Specimen: BLOOD  Result Value Ref Range Status   Specimen Description   Final    BLOOD RIGHT ANTECUBITAL Performed at [facility] [address] [place], [place] [address]    Special Requests   Final    BOTTLES DRAWN AEROBIC AND ANAEROBIC Blood Culture adequate volume Performed at [facility] [address], [place], [place] [address]    Culture   Final    NO GROWTH 2 DAYS Performed at [facility] [address], [place], [place] [address]     Report Status PENDING  Incomplete  Culture, blood (routine x 2)     Status: None (Preliminary result)   Collection Time: 07/12/20 11:45 AM   Specimen: BLOOD  Result Value Ref Range Status   Specimen Description   Final    BLOOD BLOOD RIGHT FOREARM Performed at [facility] [address] [place], [place] [address]    Special Requests   Final    BOTTLES DRAWN AEROBIC AND ANAEROBIC Blood Culture adequate volume Performed at [facility] [address] [place], [place] [address]    Culture   Final    NO GROWTH 2 DAYS Performed at [facility] [address] [place], [place] [address]    Report Status PENDING  Incomplete    No results for input(s): LIPASE, AMYLASE in the last 168 hours. No results for input(s): AMMONIA in the last 168 hours.  Cardiac Enzymes: No results for input(s): CKTOTAL, CKMB, CKMBINDEX, TROPONINI in the last 168 hours. BNP (last 3 results) Recent Labs    03/07/20 1210  BNP 87.6    ProBNP (last 3 results) Recent Labs    03/06/20 1504  PROBNP 78.0    Studies:  No results found.     Oswald Hillock   [facility] If 7PM-7AM, please contact night-coverage at [email or web address], Office  [phone]   07/14/2020, 5:04 PM  LOS: 2 days

## 2020-07-15 MED ORDER — LIP MEDEX EX OINT
TOPICAL_OINTMENT | CUTANEOUS | Status: AC
  Administered 2020-07-15: 1
  Filled 2020-07-15: qty 7

## 2020-07-15 MED ORDER — FUROSEMIDE 10 MG/ML IJ SOLN
40.0000 mg | Freq: Once | INTRAMUSCULAR | Status: AC
  Administered 2020-07-15: 40 mg via INTRAVENOUS
  Filled 2020-07-15: qty 4

## 2020-07-15 MED ORDER — CLONAZEPAM 0.5 MG PO TABS
0.5000 mg | ORAL_TABLET | Freq: Two times a day (BID) | ORAL | Status: DC
  Administered 2020-07-15: 0.5 mg via ORAL
  Filled 2020-07-15: qty 1

## 2020-07-15 MED ORDER — IPRATROPIUM-ALBUTEROL 0.5-2.5 (3) MG/3ML IN SOLN: 3.0000 mL | RESPIRATORY_TRACT | Status: DC | PRN

## 2020-07-15 NOTE — Progress Notes (Signed)
Pharmacy Antibiotic Note  [name] Mathews is a 75 y.o. male admitted on 07/12/2020 with SOB.  Pharmacy has been consulted for cefepime dosing for PNA.  D4 Cefepime/zithro Afebrile  Plan:  Continue current Cefepime dosing - 2 g IV q8h  Follow renal function for necessary dose adjustments  What is plan for LOT? 5 days?  Height: 5\' 5"  (165.1 cm) Weight: 62.6 kg (138 lb) IBW/kg (Calculated) : 61.5  Temp (24hrs), Avg:98.4 F (36.9 C), Min:98.3 F (36.8 C), Max:98.6 F (37 C)  Recent Labs  Lab 07/12/20 0523 07/12/20 1145 07/13/20 1100 07/14/20 0500  WBC 16.1*  --  10.1 9.3  CREATININE 0.84  --  0.64 0.62  LATICACIDVEN  --  1.0  --   --     Estimated Creatinine Clearance: 70.5 mL/min (by C-G formula based on SCr of 0.62 mg/dL).    Allergies  Allergen Reactions  . Bee Venom Anaphylaxis, Shortness Of Breath and Swelling    Tongue swelling.   . Wasp Venom Anaphylaxis, Shortness Of Breath and Swelling    Tongue swelling   . Percocet [Oxycodone-Acetaminophen] Palpitations    Antimicrobials this admission: Cefepime 1/20 >>  Dose adjustments this admission:  Microbiology results:  Thank you for allowing pharmacy to be a part of this patient's care.  Kara Mead, PharmD 07/15/2020 1:10 PM

## 2020-07-15 NOTE — Progress Notes (Signed)
Patient given Klonopin 0.5 mg PO for anxiety, per patient request. He fell asleep for a couple hours and when he woke up he was confused. He pulled out his port line removed his Telemetry and took his gown off. He was attempting to get out of bed. He stated he was on a mission for the [employer] to kill someone. Dr. Darrick Meigs called and informed. Klonopin discontinued. Patient was reoriented and states I was suppose to kill someone. 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 He denies having PTSD. Daughter came in to visit.

## 2020-07-15 NOTE — Progress Notes (Signed)
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  Patient said that he took a steroid pill and albuterol.  After about half an hour his symptoms improved a little.  As patient continued to have shortness of breath so he came to ED for further evaluation.  Chest x-ray showed multiple opacities in both lung fields.    Subjective   Patient seen and examined, swallow evaluation obtained yesterday was normal.  No aspiration noted.   Assessment/Plan:     1. 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  Patient started on cefepime, Zithromax.  Patient started on  Mucinex 1200 mg p.o. twice daily, flutter valve every 4 hours.  Continue DuoNeb nebulizers to scheduled every 6 hours.  SARS-CoV-2 RT-PCR is negative. 2. Anxiety-patient was started on Klonopin 0.5 mg p.o. twice daily this morning however he became confused.  We will hold Klonopin at this time. 3. Multifocal pneumonia-concern for aspiration, swallow evaluation obtained was negative for aspiration.  Small continue antibiotics as above.  WBC is down to 9000. 4. History of prostate cancer with lung metastasis-patient followed with oncology as outpatient 5. Moderate protein calorie malnutrition-dietitian consulted 6. D-dimer elevation-D-dimer is elevated at 4.50, CTA chest was negative for PE, lower extremity venous duplex was negative for DVT.      COVID-19 Labs  No results for input(s): DDIMER, FERRITIN, LDH, CRP in the last 72 hours.  Lab Results  Component Value Date   SARSCOV2NAA NEGATIVE 07/12/2020   SARSCOV2NAA NEGATIVE 05/26/2020   [place] NEGATIVE 03/07/2020   [place] NEGATIVE 11/23/2019     Scheduled medications:   . azithromycin  500 mg  Oral Daily  . Chlorhexidine Gluconate Cloth  6 each Topical Daily  . clonazePAM  0.5 mg Oral BID  . feeding supplement  237 mL Oral BID  . gabapentin  300 mg Oral QHS  . guaiFENesin  1,200 mg Oral BID  . heparin  5,000 Units Subcutaneous Q8H  . mirtazapine  15 mg Oral QHS         CBG: No results for input(s): GLUCAP in the last 168 hours.  SpO2: 97 % O2 Flow Rate (L/min): (S) 6 L/min (was found on 6L [place])    CBC: Recent Labs  Lab 07/12/20 0523 07/13/20 1100 07/14/20 0500  WBC 16.1* 10.1 9.3  NEUTROABS 13.9* 8.1*  --   HGB 9.9* 9.8* 9.5*  HCT 35.0* 34.4* 34.1*  MCV 88.8 90.1 90.9  PLT 371 341 035    Basic Metabolic Panel: Recent Labs  Lab 07/12/20 0523 07/13/20 1100 07/14/20 0500  NA 144 143 143  K 4.0 4.2 4.1  CL 104 102 102  CO2 31 30 32  GLUCOSE 107* 94 96  BUN 15 13 12   CREATININE 0.84 0.64 0.62  CALCIUM 8.2* 8.2* 8.0*     Liver Function Tests: Recent Labs  Lab 07/13/20 1100 07/14/20 0500  AST 20 18  ALT 8 8  ALKPHOS 68 63  BILITOT 0.5 0.4  PROT 6.1* 6.2*  ALBUMIN 2.6* 2.6*     Antibiotics: Anti-infectives (From admission, onward)   Start     Dose/Rate Route Frequency Ordered Stop   07/14/20 1115  azithromycin (ZITHROMAX) tablet 500 mg        500 mg Oral Daily  07/14/20 1015     07/12/20 1800  ceFEPIme (MAXIPIME) 2 g in sodium chloride 0.9 % 100 mL IVPB        2 g 200 mL/hr over 30 Minutes Intravenous Every 8 hours 07/12/20 0947     07/12/20 1130  ceFEPIme (MAXIPIME) 2 g in sodium chloride 0.9 % 100 mL IVPB  Status:  Discontinued        2 g 200 mL/hr over 30 Minutes Intravenous  Once 07/12/20 1120 07/12/20 1143   07/12/20 1130  azithromycin (ZITHROMAX) 500 mg in sodium chloride 0.9 % 250 mL IVPB  Status:  Discontinued        500 mg 250 mL/hr over 60 Minutes Intravenous Every 24 hours 07/12/20 1120 07/14/20 1015   07/12/20 0945  ceFEPIme (MAXIPIME) 2 g in sodium chloride 0.9 % 100 mL IVPB        2 g 200 mL/hr over 30 Minutes  Intravenous  Once 07/12/20 0935 07/12/20 1032   07/12/20 0000  doxycycline (VIBRAMYCIN) 100 MG capsule        100 mg Oral 2 times daily 07/12/20 0431         DVT prophylaxis: Heparin  Code Status: DNR  Family Communication: No family at bedside   Consultants:    Procedures:      Objective   Vitals:   07/14/20 1732 07/14/20 2128 07/15/20 0604 07/15/20 1402  BP:  139/85 (!) 143/90 127/68  Pulse:  96 64 (!) 105  Resp:  (!) 24 18 16   Temp:  98.6 F (37 C) 98.4 F (36.9 C) (!) 97.5 F (36.4 C)  TempSrc:  Oral Oral Oral  SpO2: 100% 96% 94% 97%  Weight:      Height:        Intake/Output Summary (Last 24 hours) at 07/15/2020 1623 Last data filed at 07/15/2020 1426 Gross per 24 hour  Intake 60 ml  Output 1300 ml  Net -1240 ml    01/21 1901 - 01/23 0700 In: -  Out: 800 [Urine:800]  Filed Weights   07/12/20 0951  Weight: 62.6 kg    Physical Examination:  General-appears in no acute distress Heart-S1-S2, regular, no murmur auscultated Lungs-bilateral rhonchi auscultated Abdomen-soft, nontender, no organomegaly Extremities-no edema in the lower extremities Neuro-alert, oriented x3, no focal deficit noted  Status is: Inpatient  Dispo: The patient is from: Home              Anticipated d/c is to: Home              Anticipated d/c date is: 07/16/2020              Patient currently not medically stable for discharge  Barrier to discharge-ongoing treatment for pneumonia       Data Reviewed:   Recent Results (from the past 240 hour(s))  SARS CORONAVIRUS 2 (TAT 6-24 HRS) Nasopharyngeal Nasopharyngeal Swab     Status: None   Collection Time: 07/12/20  5:23 AM   Specimen: Nasopharyngeal Swab  Result Value Ref Range Status   SARS Coronavirus 2 NEGATIVE NEGATIVE Final    Comment: (NOTE) SARS-CoV-2 target nucleic acids are NOT DETECTED.  The SARS-CoV-2 RNA is generally detectable in upper and lower respiratory specimens during the acute phase of  infection. Negative results do not preclude SARS-CoV-2 infection, do not rule out co-infections with other pathogens, and should not be used as the sole basis for treatment or other patient management decisions. Negative results must be combined with clinical observations,  patient history, and epidemiological information. The expected result is Negative.  Fact Sheet for Patients: SugarRoll.be  Fact Sheet for Healthcare Providers: https://www.woods-mathews.com/  This test is not yet approved or cleared by the [place] FDA and  has been authorized for detection and/or diagnosis of SARS-CoV-2 by FDA under an Emergency Use Authorization (EUA). This EUA will remain  in effect (meaning this test can be used) for the duration of the COVID-19 declaration under Se ction 564(b)(1) of the Act, 21 U.S.C. section 360bbb-3(b)(1), unless the authorization is terminated or revoked sooner.  Performed at [facility], [place] [address], [place], [place] [address]   MRSA PCR Screening     Status: None   Collection Time: 07/12/20  9:43 AM   Specimen: Nasopharyngeal  Result Value Ref Range Status   MRSA by PCR NEGATIVE NEGATIVE Final    Comment:        The GeneXpert MRSA Assay (FDA approved for NASAL specimens only), is one component of a comprehensive MRSA colonization surveillance program. It is not intended to diagnose MRSA infection nor to guide or monitor treatment for MRSA infections. Performed at [facility], [place] [address], [place], [place] [address]   Culture, blood (routine x 2)     Status: None (Preliminary result)   Collection Time: 07/12/20 11:45 AM   Specimen: BLOOD  Result Value Ref Range Status   Specimen Description   Final    BLOOD RIGHT ANTECUBITAL Performed at [place] [address], [place], [place] [address]    Special Requests   Final    BOTTLES DRAWN AEROBIC AND ANAEROBIC  Blood Culture adequate volume Performed at [place] [address], [place], [place] [address]    Culture   Final    NO GROWTH 3 DAYS Performed at [facility], [place] [address], [place], [place] [address]    Report Status PENDING  Incomplete  Culture, blood (routine x 2)     Status: None (Preliminary result)   Collection Time: 07/12/20 11:45 AM   Specimen: BLOOD  Result Value Ref Range Status   Specimen Description   Final    BLOOD BLOOD RIGHT FOREARM Performed at [place] [address], [place], [place] [address]    Special Requests   Final    BOTTLES DRAWN AEROBIC AND ANAEROBIC Blood Culture adequate volume Performed at [place] [address], [place], [place] [address]    Culture   Final    NO GROWTH 3 DAYS Performed at [facility], [place] [address] [place], [place] [address]    Report Status PENDING  Incomplete    No results for input(s): LIPASE, AMYLASE in the last 168 hours. No results for input(s): AMMONIA in the last 168 hours.  Cardiac Enzymes: No results for input(s): CKTOTAL, CKMB, CKMBINDEX, TROPONINI in the last 168 hours. BNP (last 3 results) Recent Labs    03/07/20 1210  BNP 87.6    ProBNP (last 3 results) Recent Labs    03/06/20 1504  PROBNP 78.0    Studies:  No results found.     Oswald Hillock   [facility] If 7PM-7AM, please contact night-coverage at [email or web address], Office  [phone]   07/15/2020, 4:23 PM  LOS: 3 days

## 2020-07-16 LAB — CBC
HCT: 36.8 % — ABNORMAL LOW (ref 39.0–52.0)
Hemoglobin: 10.2 g/dL — ABNORMAL LOW (ref 13.0–17.0)
MCH: 25.1 pg — ABNORMAL LOW (ref 26.0–34.0)
MCHC: 27.7 g/dL — ABNORMAL LOW (ref 30.0–36.0)
MCV: 90.6 fL (ref 80.0–100.0)
Platelets: 353 10*3/uL (ref 150–400)
RBC: 4.06 MIL/uL — ABNORMAL LOW (ref 4.22–5.81)
RDW: 17.8 % — ABNORMAL HIGH (ref 11.5–15.5)
WBC: 11.6 10*3/uL — ABNORMAL HIGH (ref 4.0–10.5)
nRBC: 0 % (ref 0.0–0.2)

## 2020-07-16 LAB — BASIC METABOLIC PANEL
Anion gap: 10 (ref 5–15)
BUN: 18 mg/dL (ref 8–23)
CO2: 33 mmol/L — ABNORMAL HIGH (ref 22–32)
Calcium: 8.2 mg/dL — ABNORMAL LOW (ref 8.9–10.3)
Chloride: 102 mmol/L (ref 98–111)
Creatinine, Ser: 0.62 mg/dL (ref 0.61–1.24)
GFR, Estimated: >60 mL/min (ref >60)
Glucose, Bld: 111 mg/dL — ABNORMAL HIGH (ref 70–99)
Potassium: 3.9 mmol/L (ref 3.5–5.1)
Sodium: 145 mmol/L (ref 135–145)

## 2020-07-16 MED ORDER — LEVALBUTEROL HCL 0.63 MG/3ML IN NEBU
0.6300 mg | INHALATION_SOLUTION | Freq: Four times a day (QID) | RESPIRATORY_TRACT | Status: DC | PRN
  Administered 2020-07-17: 0.63 mg via RESPIRATORY_TRACT
  Filled 2020-07-16: qty 3

## 2020-07-16 MED ORDER — GUAIFENESIN 100 MG/5ML PO SOLN
15.0000 mL | ORAL | Status: DC
  Administered 2020-07-16 – 2020-07-19 (×18): 300 mg via ORAL
  Filled 2020-07-16: qty 20
  Filled 2020-07-16: qty 30
  Filled 2020-07-16: qty 10
  Filled 2020-07-16 (×2): qty 20
  Filled 2020-07-16 (×3): qty 10
  Filled 2020-07-16 (×8): qty 20
  Filled 2020-07-16 (×2): qty 10
  Filled 2020-07-16: qty 20

## 2020-07-16 MED ORDER — IPRATROPIUM BROMIDE 0.02 % IN SOLN
0.5000 mg | RESPIRATORY_TRACT | Status: DC | PRN
  Administered 2020-07-16 – 2020-07-17 (×2): 0.5 mg via RESPIRATORY_TRACT
  Filled 2020-07-16 (×2): qty 2.5

## 2020-07-16 MED FILL — Dexamethasone Sodium Phosphate Inj 100 MG/10ML: INTRAMUSCULAR | Qty: 1 | Status: AC

## 2020-07-16 NOTE — Progress Notes (Signed)
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  Patient said that he took a steroid pill and albuterol.  After about half an hour his symptoms improved a little.  As patient continued to have shortness of breath so he came to ED for further evaluation.  Chest x-ray showed multiple opacities in both lung fields.    Subjective   Patient seen and examined, denies shortness of breath.   Assessment/Plan:     1. 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  Patient started on cefepime, Zithromax.  Patient started on  Mucinex 1200 mg p.o. twice daily, flutter valve every 4 hours.  Continue DuoNeb nebulizers to scheduled every 6 hours.  SARS-CoV-2 RT-PCR is negative. 2. Dysphagia-patient started on dysphagia 1 diet as per speech therapy. 3. Anxiety-patient was started on Klonopin 0.5 mg p.o. twice daily however he became confused.  Klonopin is on hold at this time. 4. Multifocal pneumonia-concern for aspiration, swallow evaluation obtained, patient started on dysphagia 1 diet.  Continue antibiotics as above.  WBC is down to 9000. 5. History of prostate cancer with lung metastasis-patient followed with oncology as outpatient 6. Moderate protein calorie malnutrition-dietitian consulted 7. D-dimer elevation-D-dimer is elevated at 4.50, CTA chest was negative for PE, lower extremity venous duplex was negative for DVT.      COVID-19 Labs  No results for input(s): DDIMER, FERRITIN, LDH, CRP in the last 72 hours.  Lab Results  Component Value Date   SARSCOV2NAA NEGATIVE 07/12/2020   SARSCOV2NAA NEGATIVE 05/26/2020   [place] NEGATIVE 03/07/2020   [place] NEGATIVE 11/23/2019     Scheduled medications:   .  Chlorhexidine Gluconate Cloth  6 each Topical Daily  . feeding supplement  237 mL Oral BID  . gabapentin  300 mg Oral QHS  . guaiFENesin  15 mL Oral Q4H  . heparin  5,000 Units Subcutaneous Q8H  . mirtazapine  15 mg Oral QHS         CBG: No results for input(s): GLUCAP in the last 168 hours.  SpO2: 98 % O2 Flow Rate (L/min): (S) 6 L/min (was found on 6L )    CBC: Recent Labs  Lab 07/12/20 0523 07/13/20 1100 07/14/20 0500 07/16/20 0527  WBC 16.1* 10.1 9.3 11.6*  NEUTROABS 13.9* 8.1*  --   --   HGB 9.9* 9.8* 9.5* 10.2*  HCT 35.0* 34.4* 34.1* 36.8*  MCV 88.8 90.1 90.9 90.6  PLT 371 341 327 462    Basic Metabolic Panel: Recent Labs  Lab 07/12/20 0523 07/13/20 1100 07/14/20 0500 07/16/20 0527  NA 144 143 143 145  K 4.0 4.2 4.1 3.9  CL 104 102 102 102  CO2 31 30 32 33*  GLUCOSE 107* 94 96 111*  BUN 15 13 12 18   CREATININE 0.84 0.64 0.62 0.62  CALCIUM 8.2* 8.2* 8.0* 8.2*     Liver Function Tests: Recent Labs  Lab 07/13/20 1100 07/14/20 0500  AST 20 18  ALT 8 8  ALKPHOS 68 63  BILITOT 0.5 0.4  PROT 6.1* 6.2*  ALBUMIN 2.6* 2.6*     Antibiotics: Anti-infectives (From admission, onward)   Start     Dose/Rate Route Frequency Ordered Stop   07/14/20 1115  azithromycin (ZITHROMAX) tablet 500 mg  Status:  Discontinued        500 mg Oral Daily 07/14/20 1015 07/16/20 1324   07/12/20 1800  ceFEPIme (MAXIPIME) 2 g in sodium chloride 0.9 % 100 mL IVPB        2 g 200 mL/hr over 30 Minutes Intravenous Every 8 hours 07/12/20 0947     07/12/20 1130  ceFEPIme (MAXIPIME) 2 g in sodium chloride 0.9 % 100 mL IVPB  Status:  Discontinued        2 g 200 mL/hr over 30 Minutes Intravenous  Once 07/12/20 1120 07/12/20 1143   07/12/20 1130  azithromycin (ZITHROMAX) 500 mg in sodium chloride 0.9 % 250 mL IVPB  Status:  Discontinued        500 mg 250 mL/hr over 60 Minutes Intravenous Every 24 hours 07/12/20 1120 07/14/20 1015   07/12/20 0945  ceFEPIme (MAXIPIME) 2 g in  sodium chloride 0.9 % 100 mL IVPB        2 g 200 mL/hr over 30 Minutes Intravenous  Once 07/12/20 0935 07/12/20 1032   07/12/20 0000  doxycycline (VIBRAMYCIN) 100 MG capsule        100 mg Oral 2 times daily 07/12/20 0431         DVT prophylaxis: Heparin  Code Status: DNR  Family Communication: No family at bedside   Consultants:    Procedures:      Objective   Vitals:   07/15/20 1402 07/15/20 2137 07/16/20 0438 07/16/20 1339  BP: 127/68 134/76 (!) 152/88 128/82  Pulse: (!) 105 (!) 104 (!) 102 (!) 107  Resp: 16 16 18 20   Temp: (!) 97.5 F (36.4 C) 98.9 F (37.2 C) 99 F (37.2 C) 99.7 F (37.6 C)  TempSrc: Oral Oral Oral Oral  SpO2: 97% 90% 91% 98%  Weight:      Height:        Intake/Output Summary (Last 24 hours) at 07/16/2020 1638 Last data filed at 07/16/2020 0421 Gross per 24 hour  Intake 60 ml  Output 600 ml  Net -540 ml    01/22 1901 - 01/24 0700 In: 120 [P.O.:120] Out: 1100 [Urine:1100]  Filed Weights   07/12/20 0951  Weight: 62.6 kg    Physical Examination:  General-appears in no acute distress Heart-S1-S2, regular, no murmur auscultated Lungs- bilateral rhonchi auscultated Abdomen-soft, nontender, no organomegaly Extremities-no edema in the lower extremities Neuro-alert, oriented x3, no focal deficit noted  Status is: Inpatient  Dispo: The patient is from: Home              Anticipated d/c is to: Home              Anticipated d/c date is: 07/19/2020              Patient currently not medically stable for discharge  Barrier to discharge-ongoing treatment for pneumonia       Data Reviewed:   Recent Results (from the past 240 hour(s))  SARS CORONAVIRUS 2 (TAT 6-24 HRS) Nasopharyngeal Nasopharyngeal Swab     Status: None   Collection Time: 07/12/20  5:23 AM   Specimen: Nasopharyngeal Swab  Result Value Ref Range Status   SARS Coronavirus 2 NEGATIVE NEGATIVE Final    Comment: (NOTE) SARS-CoV-2 target nucleic acids are NOT  DETECTED.  The SARS-CoV-2 RNA is generally detectable in upper and lower respiratory specimens during the acute phase of infection. Negative results do not preclude SARS-CoV-2 infection, do not rule out co-infections with other pathogens, and should not be used as  the sole basis for treatment or other patient management decisions. Negative results must be combined with clinical observations, patient history, and epidemiological information. The expected result is Negative.  Fact Sheet for Patients: SugarRoll.be  Fact Sheet for Healthcare Providers: https://www.woods-mathews.com/  This test is not yet approved or cleared by the [place] FDA and  has been authorized for detection and/or diagnosis of SARS-CoV-2 by FDA under an Emergency Use Authorization (EUA). This EUA will remain  in effect (meaning this test can be used) for the duration of the COVID-19 declaration under Se ction 564(b)(1) of the Act, 21 U.S.C. section 360bbb-3(b)(1), unless the authorization is terminated or revoked sooner.  Performed at [facility], [place] [address] [place], [place] [address]   MRSA PCR Screening     Status: None   Collection Time: 07/12/20  9:43 AM   Specimen: Nasopharyngeal  Result Value Ref Range Status   MRSA by PCR NEGATIVE NEGATIVE Final    Comment:        The GeneXpert MRSA Assay (FDA approved for NASAL specimens only), is one component of a comprehensive MRSA colonization surveillance program. It is not intended to diagnose MRSA infection nor to guide or monitor treatment for MRSA infections. Performed at [facility] [place], [place] [address], [place], [place] [address]   Culture, blood (routine x 2)     Status: None (Preliminary result)   Collection Time: 07/12/20 11:45 AM   Specimen: BLOOD  Result Value Ref Range Status   Specimen Description   Final    BLOOD RIGHT ANTECUBITAL Performed at [place] [address] [place], [place] [address]    Special Requests   Final    BOTTLES DRAWN AEROBIC AND ANAEROBIC Blood Culture adequate volume Performed at [place] [address] [place], [place] [address]    Culture   Final    NO GROWTH 4 DAYS Performed at [facility], [place] [address], [place], [place] [address]    Report Status PENDING  Incomplete  Culture, blood (routine x 2)     Status: None (Preliminary result)   Collection Time: 07/12/20 11:45 AM   Specimen: BLOOD  Result Value Ref Range Status   Specimen Description   Final    BLOOD BLOOD RIGHT FOREARM Performed at [place] [address], [place], [place] [address]    Special Requests   Final    BOTTLES DRAWN AEROBIC AND ANAEROBIC Blood Culture adequate volume Performed at [place] [address], [place], [place] [address]    Culture   Final    NO GROWTH 4 DAYS Performed at [facility], [place] [address], [place], [place] [address]    Report Status PENDING  Incomplete    No results for input(s): LIPASE, AMYLASE in the last 168 hours. No results for input(s): AMMONIA in the last 168 hours.  Cardiac Enzymes: No results for input(s): CKTOTAL, CKMB, CKMBINDEX, TROPONINI in the last 168 hours. BNP (last 3 results) Recent Labs    03/07/20 1210  BNP 87.6    ProBNP (last 3 results) Recent Labs    03/06/20 1504  PROBNP 78.0    Studies:  No results found.     Oswald Hillock   [facility] If 7PM-7AM, please contact night-coverage at [email or web address], Office  [phone]   07/16/2020, 4:38 PM  LOS: 4 days

## 2020-07-16 NOTE — Progress Notes (Signed)
  Speech Language Pathology Treatment: Dysphagia  Patient Details Name: [name] Mathews MRN: [id number] DOB: [birth year] Today's Date: 07/16/2020 Time: 0925-0950 SLP Time Calculation (min) (ACUTE ONLY): 25 min  Assessment / Plan / Recommendation Clinical Impression  Patient seen to address dysphagia goals with session focusing on skilled observation with PO's as well as patient education and discussion of PO toleration. Patient reported that he has been doing better with his breathing as compared to previous date but still having some wheezing. He also reported that he has had mainly liquids, but some puree solids (applesauce) but that he has not felt comfortable trying much of regular solids. SLP observed patient with PO intake of puree solids (applesauce) and straw sips of thin water. Patient did exhibit some delayed dry and at times mildly congested coughing during PO intake, however difficult to differentiate between PO involvement versus secretions. SLP to downgrade patient to Dys 1 from Regular textures secondary to observations during this session as well as patient's preference at this time. SLP will continue to follow for toleration of diet, readiness for upgrade of solid textures and determination of need for objective swallow study.    HPI HPI: 75 y.o. 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 Presenting with dyspnea. Reports that 2 days ago he felt "a rattling" in his chest and was gasping for air. His daughter helped him sit up to help give him more room to breathe. He took a steroid pill and some albuterol. After about an hour, he felt his symptoms improve a little. He states that he was able to sit in his recliner for the rest of the day. If he tried to move or even eat/drink, his breathing worsened. This continued through last night around 9 pm. At that time, he felt that he couldn't catch up on his breathing and called for EMS to bring him to  the hospital.      SLP Plan  Continue with current plan of care       Recommendations  Diet recommendations: Dysphagia 1 (puree);Thin liquid Liquids provided via: Cup;Straw Medication Administration: Crushed with puree Supervision: Patient able to self feed Compensations: Minimize environmental distractions;Slow rate;Small sips/bites Postural Changes and/or Swallow Maneuvers: Seated upright 90 degrees                Oral Care Recommendations: Oral care BID Follow up Recommendations: None SLP Visit Diagnosis: Dysphagia, unspecified (R13.10) Plan: Continue with current plan of care       GO                [name] Baller, MA, CCC-SLP Speech Therapy

## 2020-07-16 NOTE — Care Management Important Message (Signed)
Medicare important message printed for Nora Clements, NCM to give to the patient. 

## 2020-07-17 ENCOUNTER — Inpatient Hospital Stay: Payer: Medicare Other

## 2020-07-17 ENCOUNTER — Inpatient Hospital Stay (HOSPITAL_COMMUNITY): Payer: Medicare Other

## 2020-07-17 ENCOUNTER — Inpatient Hospital Stay: Payer: Medicare Other | Admitting: Oncology

## 2020-07-17 LAB — CULTURE, BLOOD (ROUTINE X 2)
Culture: NO GROWTH
Culture: NO GROWTH
Special Requests: ADEQUATE
Special Requests: ADEQUATE

## 2020-07-17 MED ORDER — FUROSEMIDE 10 MG/ML IJ SOLN
40.0000 mg | Freq: Once | INTRAMUSCULAR | Status: AC
  Administered 2020-07-17: 40 mg via INTRAVENOUS
  Filled 2020-07-17: qty 4

## 2020-07-17 NOTE — Progress Notes (Addendum)
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  Patient said that he took a steroid pill and albuterol.  After about half an hour his symptoms improved a little.  As patient continued to have shortness of breath so he came to ED for further evaluation.  Chest x-ray showed multiple opacities in both lung fields.    Subjective   Patient seen and examined, says his breathing better.  Still requiring 5 liters per minute oxygen.   Assessment/Plan:     1. 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  Patient started on cefepime, Zithromax.  Zithromax was stopped after 5 days.  We will continue with cefepime for total 7 days of therapy.  Stop date for cefepime is 07/18/2020.  Patient started on  Mucinex 1200 mg p.o. twice daily, flutter valve every 4 hours.  Continue DuoNeb nebulizers to scheduled every 6 hours.  SARS-CoV-2 RT-PCR is negative.  He appears mildly fluid overload, will give 1 dose of Lasix 40 mg IV. 2. Dysphagia-patient started on dysphagia 1 diet as per speech therapy. 3. ?  Delirium/anxiety-patient was started on Klonopin 0.5 mg p.o. twice daily however he became confused after 1 dose.  Klonopin was discontinued.  As per family patient has been getting confused intermittently.  At this time patient is not confused he is alert, oriented x3, intact insight and judgment.  Called and discussed with patient's oncologist Dr. Alen Blew, will obtain MRI brain to rule out stroke versus brain metastasis.  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  I called and discussed with ENT on-call Dr. Marcelline Deist, he says that only treatment is surgery and  that also if the fungal ball is eroding the bone was patient having visual changes.  It would not cause mental status changes.  Since patient is asymptomatic, he can follow-up with ENT as outpatient if symptoms arise. 4. Multifocal pneumonia-concern for aspiration, swallow evaluation obtained, patient started on dysphagia 1 diet.  Continue antibiotics as above.  WBC is down to 9000.  Will obtain CT chest without contrast today.  He does have history of bilateral pleural effusions, I have consulted pulmonology to assist with further management .  We will follow CT chest without contrast. 5. History of prostate cancer with lung metastasis-patient followed with oncology as outpatient.  He has been on palliative chemotherapy. 6. Moderate protein calorie malnutrition-dietitian consulted 7. D-dimer elevation-D-dimer is elevated at 4.50, CTA chest was negative for PE, lower extremity venous duplex was negative for DVT.      COVID-19 Labs  No results for input(s): DDIMER, FERRITIN, LDH, CRP in the last 72 hours.  Lab Results  Component Value Date   SARSCOV2NAA NEGATIVE 07/12/2020   SARSCOV2NAA NEGATIVE 05/26/2020   [place] NEGATIVE 03/07/2020   [place] NEGATIVE 11/23/2019     Scheduled medications:   . Chlorhexidine Gluconate Cloth  6 each Topical Daily  . feeding supplement  237 mL Oral BID  . furosemide  40 mg Intravenous Once  . gabapentin  300 mg Oral QHS  . guaiFENesin  15 mL Oral Q4H  . heparin  5,000 Units Subcutaneous Q8H  . mirtazapine  15 mg Oral QHS  SpO2: 98 % O2 Flow Rate (L/min): 5 L/min    CBC: Recent Labs  Lab 07/12/20 0523 07/13/20 1100 07/14/20 0500 07/16/20 0527  WBC 16.1* 10.1 9.3 11.6*  NEUTROABS 13.9* 8.1*  --   --   HGB 9.9* 9.8* 9.5* 10.2*  HCT 35.0* 34.4* 34.1* 36.8*  MCV 88.8 90.1 90.9 90.6  PLT 371 341 327 034    Basic Metabolic Panel: Recent Labs  Lab 07/12/20 0523 07/13/20 1100 07/14/20 0500 07/16/20 0527  NA 144 143 143  145  K 4.0 4.2 4.1 3.9  CL 104 102 102 102  CO2 31 30 32 33*  GLUCOSE 107* 94 96 111*  BUN 15 13 12 18   CREATININE 0.84 0.64 0.62 0.62  CALCIUM 8.2* 8.2* 8.0* 8.2*     Liver Function Tests: Recent Labs  Lab 07/13/20 1100 07/14/20 0500  AST 20 18  ALT 8 8  ALKPHOS 68 63  BILITOT 0.5 0.4  PROT 6.1* 6.2*  ALBUMIN 2.6* 2.6*     Antibiotics: Anti-infectives (From admission, onward)   Start     Dose/Rate Route Frequency Ordered Stop   07/14/20 1115  azithromycin (ZITHROMAX) tablet 500 mg  Status:  Discontinued        500 mg Oral Daily 07/14/20 1015 07/16/20 1324   07/12/20 1800  ceFEPIme (MAXIPIME) 2 g in sodium chloride 0.9 % 100 mL IVPB        2 g 200 mL/hr over 30 Minutes Intravenous Every 8 hours 07/12/20 0947 07/18/20 2359   07/12/20 1130  ceFEPIme (MAXIPIME) 2 g in sodium chloride 0.9 % 100 mL IVPB  Status:  Discontinued        2 g 200 mL/hr over 30 Minutes Intravenous  Once 07/12/20 1120 07/12/20 1143   07/12/20 1130  azithromycin (ZITHROMAX) 500 mg in sodium chloride 0.9 % 250 mL IVPB  Status:  Discontinued        500 mg 250 mL/hr over 60 Minutes Intravenous Every 24 hours 07/12/20 1120 07/14/20 1015   07/12/20 0945  ceFEPIme (MAXIPIME) 2 g in sodium chloride 0.9 % 100 mL IVPB        2 g 200 mL/hr over 30 Minutes Intravenous  Once 07/12/20 0935 07/12/20 1032   07/12/20 0000  doxycycline (VIBRAMYCIN) 100 MG capsule        100 mg Oral 2 times daily 07/12/20 0431         DVT prophylaxis: Heparin  Code Status: DNR  Family Communication:    Consultants:    Procedures:      Objective   Vitals:   07/16/20 1339 07/16/20 2114 07/17/20 0412 07/17/20 0549  BP: 128/82 121/71  137/85  Pulse: (!) 107 (!) 104  (!) 108  Resp: 20     Temp: 99.7 F (37.6 C) 99.8 F (37.7 C)  99.4 F (37.4 C)  TempSrc: Oral Oral  Oral  SpO2: 98% 99% 97% 98%  Weight:      Height:        Intake/Output Summary (Last 24 hours) at 07/17/2020 1057 Last data filed at  07/17/2020 0551 Gross per 24 hour  Intake 100 ml  Output 500 ml  Net -400 ml    01/23 1901 - 01/25 0700 In: 100  Out: 800 [Urine:800]  Filed Weights   07/12/20 0951  Weight: 62.6 kg    Physical Examination:  General-appears in no acute distress Heart-S1-S2, regular, no murmur auscultated Lungs-bilateral rhonchi auscultated Abdomen-soft, nontender, no organomegaly Extremities-no edema in the lower  extremities Neuro-alert, oriented x3, no focal deficit noted, intact insight and judgment  Status is: Inpatient  Dispo: The patient is from: Home              Anticipated d/c is to: Home              Anticipated d/c date is: 07/19/2020              Patient currently not medically stable for discharge  Barrier to discharge-ongoing treatment for pneumonia       Data Reviewed:   Recent Results (from the past 240 hour(s))  SARS CORONAVIRUS 2 (TAT 6-24 HRS) Nasopharyngeal Nasopharyngeal Swab     Status: None   Collection Time: 07/12/20  5:23 AM   Specimen: Nasopharyngeal Swab  Result Value Ref Range Status   SARS Coronavirus 2 NEGATIVE NEGATIVE Final    Comment: (NOTE) SARS-CoV-2 target nucleic acids are NOT DETECTED.  The SARS-CoV-2 RNA is generally detectable in upper and lower respiratory specimens during the acute phase of infection. Negative results do not preclude SARS-CoV-2 infection, do not rule out co-infections with other pathogens, and should not be used as the sole basis for treatment or other patient management decisions. Negative results must be combined with clinical observations, patient history, and epidemiological information. The expected result is Negative.  Fact Sheet for Patients: SugarRoll.be  Fact Sheet for Healthcare Providers: https://www.woods-mathews.com/  This test is not yet approved or cleared by the [place] FDA and  has been authorized for detection and/or diagnosis of SARS-CoV-2 by FDA  under an Emergency Use Authorization (EUA). This EUA will remain  in effect (meaning this test can be used) for the duration of the COVID-19 declaration under Se ction 564(b)(1) of the Act, 21 U.S.C. section 360bbb-3(b)(1), unless the authorization is terminated or revoked sooner.  Performed at [facility], [place] [address], [place], [place] [address]   MRSA PCR Screening     Status: None   Collection Time: 07/12/20  9:43 AM   Specimen: Nasopharyngeal  Result Value Ref Range Status   MRSA by PCR NEGATIVE NEGATIVE Final    Comment:        The GeneXpert MRSA Assay (FDA approved for NASAL specimens only), is one component of a comprehensive MRSA colonization surveillance program. It is not intended to diagnose MRSA infection nor to guide or monitor treatment for MRSA infections. Performed at [facility], [place] [address], [place], [place] [address]   Culture, blood (routine x 2)     Status: None (Preliminary result)   Collection Time: 07/12/20 11:45 AM   Specimen: BLOOD  Result Value Ref Range Status   Specimen Description   Final    BLOOD RIGHT ANTECUBITAL Performed at [facility] [address] [place], [place] [address]    Special Requests   Final    BOTTLES DRAWN AEROBIC AND ANAEROBIC Blood Culture adequate volume Performed at [facility] [address], [place], [place] [address]    Culture   Final    NO GROWTH 4 DAYS Performed at [facility], [place] [address] [place], [place] [address]    Report Status PENDING  Incomplete  Culture, blood (routine x 2)     Status: None (Preliminary result)   Collection Time: 07/12/20 11:45 AM   Specimen: BLOOD  Result Value Ref Range Status   Specimen Description   Final    BLOOD BLOOD RIGHT FOREARM Performed at [facility], [place] [address], [place], [place] [address]    Special Requests   Final    BOTTLES DRAWN AEROBIC AND ANAEROBIC Blood  Culture adequate volume Performed at [facility] [address], [place], [place] [address]    Culture   Final    NO GROWTH 4 DAYS Performed at [facility], [place] [address] [place], [place] [address]    Report Status PENDING  Incomplete    No results for input(s): LIPASE, AMYLASE in the last 168 hours. No results for input(s): AMMONIA in the last 168 hours.  Cardiac Enzymes: No results for input(s): CKTOTAL, CKMB, CKMBINDEX, TROPONINI in the last 168 hours. BNP (last 3 results) Recent Labs    03/07/20 1210  BNP 87.6    ProBNP (last 3 results) Recent Labs    03/06/20 1504  PROBNP 78.0    Studies:  No results found.     Oswald Hillock   [facility] If 7PM-7AM, please contact night-coverage at [email or web address], Office  [phone]   07/17/2020, 10:57 AM  LOS: 5 days

## 2020-07-17 NOTE — Progress Notes (Addendum)
  Speech Language Pathology Treatment: Dysphagia  Patient Details Name: [name] Mathews MRN: [id number] DOB: Jan 20, 1946 Today's Date: 07/17/2020 Time: 5993-5701 SLP Time Calculation (min) (ACUTE ONLY): 30 min  Assessment / Plan / Recommendation Clinical Impression  Mod I cues to verbalize need to cease eating if dyspneic, Educated pt to importance or oral care/dental brushing for pulmonary health.  Observed pt consuming water few small boluses via straw - no indication of aspiration.     He is observed to clear his throat frequently and have subtle wet voice without intake.   Pt denies any reflux symptoms.  As he states his intake is poor due to dyspnea, advised he try to get Ensures and water for nutrition.  Advised pt start meals with liquids due to oxygen usage and xerostomia using teach back.  Toward end of session, pt was sleepy and remained with his eyes closed. Improved tolerance of puree/thin diet per pt. Provided swallow precaution signs for his mitigation.   SLP will follow up for indication for instrumental swallow evaluation or/and dietary advancement but currently he approves puree/thin diet to mitigate dysphagia.    Please order PT/OT if you agree.  Thanks.     PI HPI: 75 y.o. 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 Presenting with dyspnea. Reports that 2 days ago he felt "a rattling" in his chest and was gasping for air. His daughter helped him sit up to help give him more room to breathe. He took a steroid pill and some albuterol. After about an hour, he felt his symptoms improve a little. He states that he was able to sit in his recliner for the rest of the day. If he tried to move or even eat/drink, his breathing worsened. This continued through last night around 9 pm. At that time, he felt that he couldn't catch up on his breathing and called for EMS to bring him to the hospital.  Pt reports since he was diagnosed with lung cancer  - he has difficulty with food and drink "going the wrong way" .  This occurs due to his work of breathing as he states "I get short of breath"      SLP Plan  Continue with current plan of care       Recommendations  Diet recommendations: Dysphagia 1 (puree);Thin liquid Liquids provided via: Cup;Straw Medication Administration: Whole meds with puree Supervision: Staff to assist with self feeding Compensations: Minimize environmental distractions;Slow rate;Small sips/bites;Other (Comment) (start intake with liquids - prefer water) Postural Changes and/or Swallow Maneuvers: Seated upright 90 degrees                Oral Care Recommendations: Oral care BID Follow up Recommendations: None SLP Visit Diagnosis: Dysphagia, unspecified (R13.10) Plan: Continue with current plan of care       GO                Macario Golds 07/17/2020, 11:39 AM

## 2020-07-17 NOTE — Progress Notes (Signed)
PT Cancellation Note  Patient Details Name: [name] Mathews MRN: [id number] DOB: 09-07-1945   Cancelled Treatment:    Reason Eval/Treat Not Completed: Other (comment) (pt is being washed up by nurse techs. Will follow.)   Philomena Doheny PT 07/17/2020  Acute Rehabilitation Services Pager [phone] Office [phone]

## 2020-07-18 DIAGNOSIS — J9621 Acute and chronic respiratory failure with hypoxia: Secondary | ICD-10-CM

## 2020-07-18 LAB — BASIC METABOLIC PANEL
Anion gap: 10 (ref 5–15)
BUN: 19 mg/dL (ref 8–23)
CO2: 36 mmol/L — ABNORMAL HIGH (ref 22–32)
Calcium: 8.9 mg/dL (ref 8.9–10.3)
Chloride: 99 mmol/L (ref 98–111)
Creatinine, Ser: 0.59 mg/dL — ABNORMAL LOW (ref 0.61–1.24)
GFR, Estimated: >60 mL/min (ref >60)
Glucose, Bld: 122 mg/dL — ABNORMAL HIGH (ref 70–99)
Potassium: 3.7 mmol/L (ref 3.5–5.1)
Sodium: 145 mmol/L (ref 135–145)

## 2020-07-18 NOTE — Progress Notes (Signed)
Events was few days noted.  [name] is known to me with history of advanced Prostate cancer potentially lung involvement with lymphangitic spread.  He has been receiving palliative chemotherapy although his most recent PSA has been rising.  CT scan obtained on July 17, 2020 was reviewed and does show potentially worsening interstitial and airspace disease which could indicate disease progression.  MRI of the brain did not show any acute pathology.   His overall prognosis is poor given the disease progression by PSA criteria and worsening pulmonary status.  This is likely contributing to his change in mental status as well.  He is currently DNR and I agree with conservative supportive managements and treating reversible conditions.  If his clinical status continues to decline, transitioning to comfort care would be recommended.  This was communicated to his daughter as well.   We will continue to follow.

## 2020-07-18 NOTE — Consult Note (Signed)
Name: [name] Mathews MRN: [id number] DOB: 02/28/1946    ADMISSION DATE:  07/12/2020 CONSULTATION DATE: 07/19/2019  REFERRING MD : [facility]  CHIEF COMPLAINT: Shortness of breath  BRIEF PATIENT DESCRIPTION:  75 year old male with acute on chronic hypoxic respiratory failure in the setting of metastatic lung cancer  SIGNIFICANT EVENTS    STUDIES:  07/17/2020 CT scan as noted worsening airspace disease but no significant change in pleural effusions   HISTORY OF PRESENT ILLNESS:   [name] Mathews is a 74 year old male who is followed by Dr. Ander Slade of pulmonary who last saw the 05/29/2020.  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  He requires 2 to 3 L of nasal cannula on a good day.  Is normal for him to be short of breath with any activity.  He does endorse purulent sputum over the last 3 days prior to admission.  He also states he is better today 07/20/2020 after being treated with antimicrobial therapy and being in hospital for 1 day.  He does have confusion treated with angiolytics.  His recent CT scan on this admission demonstrates worsening airspace interstitial process in the chest compared to past.  Also notes a similar appearance of bilateral effusions right greater than left but no increase in size.  Pulmonary critical care asked to evaluate.  He has improved with current interventions.  We will continue to follow along at this time.  PAST MEDICAL HISTORY :   has a past medical history of Hyperlipidemia, Hypertension, Prostate cancer ([place]), and Restless leg syndrome.  has a past surgical history that includes Pelvic lymph node dissection; IR IMAGING GUIDED PORT INSERTION (01/11/2019); IR CV Line Injection (02/15/2019); and IR THORACENTESIS ASP PLEURAL SPACE W/IMG GUIDE (11/25/2019). Prior to Admission medications   Medication Sig Start Date End Date Taking? Authorizing Provider  albuterol (VENTOLIN HFA) 108 (90 Base) MCG/ACT inhaler Inhale 1-2 puffs into the lungs every  6 (six) hours as needed for wheezing or shortness of breath. 01/23/20  Yes Olalere, Adewale A, MD  denosumab (XGEVA) 120 MG/1.7ML SOLN injection Inject 120 mg into the skin every 30 (thirty) days.   Yes [provider]  doxycycline (VIBRAMYCIN) 100 MG capsule Take 1 capsule (100 mg total) by mouth 2 (two) times daily. One po bid x 7 days 07/12/20  Yes Molpus, John, MD  gabapentin (NEURONTIN) 300 MG capsule Take 1 capsule (300 mg total) by mouth at bedtime. 08/17/19  Yes [name], MD  leuprolide (LUPRON) 30 MG injection Inject 30 mg into the muscle every 4 (four) months.   Yes [provider]  mirtazapine (REMERON) 15 MG tablet TAKE 1 TABLET BY MOUTH AT BEDTIME 07/11/20  Yes Paige, Victoria J, DO  Nutritional Supplements (ENSURE ACTIVE HIGH PROTEIN) LIQD Use 3 cans daily. Patient taking differently: Take 1 Can by mouth in the morning and at bedtime. . 01/31/20  Yes [name], MD  dexamethasone (DECADRON) 4 MG tablet Take 4 mg twice a day. Patient not taking: Reported on 07/12/2020 04/24/20   [name], MD   Allergies  Allergen Reactions  . Bee Venom Anaphylaxis, Shortness Of Breath and Swelling    Tongue swelling.   . Wasp Venom Anaphylaxis, Shortness Of Breath and Swelling    Tongue swelling   . Percocet [Oxycodone-Acetaminophen] Palpitations    FAMILY HISTORY:  family history includes Cervical cancer (age of onset: 70) in his sister; Heart attack (age of onset: 33) in his mother; Stroke (age of onset:  94) in his brother. SOCIAL HISTORY:  reports that he quit smoking about 47 years ago. He started smoking about 53 years ago. He has a 1.50 pack-year smoking history. He has never used smokeless tobacco. He reports that he does not drink alcohol and does not use drugs.  REVIEW OF SYSTEMS:   10 point review of system taken, please see HPI for positives and negatives.   SUBJECTIVE:  75 year old male in no acute distress at rest reports feeling better after  treatment VITAL SIGNS: Temp:  [99 F (37.2 C)-99.3 F (37.4 C)] 99 F (37.2 C) (01/26 0643) Pulse Rate:  [95-108] 108 (01/26 0643) Resp:  [17-20] 17 (01/26 0643) BP: (112-145)/(63-92) 145/92 (01/26 0643) SpO2:  [97 %-99 %] 97 % (01/26 0643)  PHYSICAL EXAMINATION: General: Frail elderly male who is in no acute distress reports feeling better Neuro: Grossly intact without focal defect but does appear to have periods of confusion HEENT: No JVD or lymphadenopathy is appreciated Cardiovascular: Heart sounds are regular Lungs: Diminished throughout faint crackles in bases Abdomen: Soft nontender positive bowel sounds Musculoskeletal: Intact without edema Skin: Warm and dry  Recent Labs  Lab 07/14/20 0500 07/16/20 0527 07/18/20 0541  NA 143 145 145  K 4.1 3.9 3.7  CL 102 102 99  CO2 32 33* 36*  BUN 12 18 19   CREATININE 0.62 0.62 0.59*  GLUCOSE 96 111* 122*   Recent Labs  Lab 07/13/20 1100 07/14/20 0500 07/16/20 0527  HGB 9.8* 9.5* 10.2*  HCT 34.4* 34.1* 36.8*  WBC 10.1 9.3 11.6*  PLT 341 327 353   CT CHEST WO CONTRAST  Result Date: 07/17/2020 CLINICAL DATA:  Suspected pneumonia. EXAM: CT CHEST WITHOUT CONTRAST TECHNIQUE: Multidetector CT imaging of the chest was performed following the standard protocol without IV contrast. COMPARISON:  July 12, 2020 FINDINGS: Cardiovascular: RIGHT-sided Port-A-Cath terminates at the caval to atrial junction. Dilation of the thoracic aorta to 3.8 cm similar to prior studies. Calcified and noncalcified atheromatous plaque in the thoracic aorta. Heart size stable without pericardial effusion. Three-vessel coronary artery calcification. Central pulmonary vasculature remains engorged measuring up to 4 cm greatest caliber similar to prior imaging. Limited assessment of cardiovascular structures given lack of intravenous contrast. Mediastinum/Nodes: No axillary lymphadenopathy. No mediastinal lymphadenopathy. No gross hilar lymphadenopathy.  Assessment is quite limited due to lack of contrast. Thoracic inlet structures grossly normal. Lungs/Pleura: Ground-glass attenuation in the chest, septal thickening and peripheral areas of consolidation that have worsened since December of 2021 and are associated with large RIGHT and moderate LEFT effusions. Consolidative process in the chest is very similar to the study of July 13, 2019 worse at the lung bases. Ground-glass opacity in the LEFT upper chest. This is stable compared to the recent comparison and new compared to the study of May 24, 2020. Peribronchial thickening and soft tissue in the lower chest may be slightly worse than on prior studies. The airways are patent centrally and associated with air bronchograms in the lower lobes. This phenomenon may be worse towards the central portion of the chest in the lung bases since the most recent comparison. Upper Abdomen: No acute upper abdominal process. No upper abdominal lymphadenopathy Musculoskeletal: Multifocal skeletal metastases with sclerosis IMPRESSION: 1. Slight worsening of airspace and interstitial process in the chest compared to recent imaging,particularly about bronchovascular structures at the lung bases and moderate to marked worsening since the exam of May 24, 2020 . Likely a combination of metastatic disease and pneumonia and or pneumonitis. Correlate with any  drug related risk factors for pneumonitis. While superimposed infection or inflammation is considered. 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 2. Similar appearance of bilateral effusions RIGHT greater than LEFT. 3. Multifocal sclerotic skeletal metastatic disease as on previous imaging. 4. Three-vessel coronary artery calcification. 5. Aortic atherosclerosis. Aortic Atherosclerosis (ICD10-I70.0). Electronically Signed   By: Zetta Bills M.D.   On: 07/17/2020 13:56   MR BRAIN WO CONTRAST  Result Date: 07/17/2020 CLINICAL DATA:  Mental  status changes of unknown cause. EXAM: MRI HEAD WITHOUT CONTRAST TECHNIQUE: Multiplanar, multiecho pulse sequences of the brain and surrounding structures were obtained without intravenous contrast. COMPARISON:  Head CT 05/25/2020 FINDINGS: Brain: Diffusion imaging does not show any acute or subacute infarction. The brain does not show accelerated atrophy. There is no evidence of old small or large vessel infarction. No intracranial mass lesion, hemorrhage, hydrocephalus or extra-axial collection. Vascular: Major vessels at the base of the brain show flow. Skull and upper cervical spine: Negative Sinuses/Orbits: Opacification of the sphenoid sinus with a T2 hypointense structure that could be a fungus ball. More ordinary appearing mucosal thickening of the right maxillary sinus. Orbits negative. Other: None IMPRESSION: 1. Normal appearance of the brain for age. 2. Opacification of the sphenoid sinus with a T2 hypointense structure that could be a fungus ball raising the possibility of chronic fungal sphenoid sinusitis. More ordinary appearing mucosal thickening of the right maxillary sinus. Electronically Signed   By: Nelson Chimes M.D.   On: 07/17/2020 14:05   DG Chest Port 1V same Day  Result Date: 07/17/2020 CLINICAL DATA:  History of lung metastatic disease with shortness of breath EXAM: PORTABLE CHEST 1 VIEW COMPARISON:  07/11/2020 FINDINGS: Cardiac shadow is stable. Right chest wall port is again seen. Increasing right-sided pleural effusion is noted. Metastatic disease in the lungs is noted bilaterally stable from the prior exam. Increased density is noted throughout the bony structures consistent with metastatic disease. IMPRESSION: Enlarging right-sided pleural effusion. Changes consistent with prostate metastatic disease. Electronically Signed   By: Inez Catalina M.D.   On: 07/17/2020 12:20   Discussion: Mr. Garr is a 75 year old male who is followed by Dr. Ander Slade of pulmonary who last saw the  05/29/2020.  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  He requires 2 to 3 L of nasal cannula on a good day.  Is normal for him to be short of breath with any activity.  He does endorse purulent sputum over the last 3 days prior to admission.  He also states he is better today 07/20/2020 after being treated with antimicrobial therapy and being in hospital for 1 day.  He does have confusion treated with angiolytics.  His recent CT scan on this admission demonstrates worsening airspace interstitial process in the chest compared to past.  Also notes a similar appearance of bilateral effusions right greater than left but no increase in size.  Pulmonary critical care asked to evaluate.  He has improved with current interventions.  We will continue to follow along at this time.   ASSESSMENT: Acute on chronic hypoxic respiratory failure in the setting of suspected infectious process. Unchanged bilateral pleural effusions Chronic obstructive  pulmonary disease Dysphagia status post speech therapy evaluation dysphagia 1 diet DNR        PLAN:  Agree with antimicrobial therapy Diuresis is reasonable O2 to keep O2 saturations greater than 88% Agree with dysphagia 1 diet to findings of speech evaluation. Pulmonary critical care will follow along  Richardson Landry Minor ACNP Acute Care Nurse Practitioner [place] Please consult Amion 07/18/2020, 9:08 AM

## 2020-07-18 NOTE — Evaluation (Addendum)
Physical Therapy Evaluation Patient Details Name: [name] Mathews MRN: [id number] DOB: 11-Jun-1946 Today's Date: 07/18/2020   History of Present Illness  75 y.o. 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 Presenting with dyspnea on 07/12/20.Chest x-ray showed multiple opacities in both lung fields. Patient on 3-4 L at home.  Clinical Impression  Mr. Laverna Peace resting in bed, SPO2 96% on 5 L and HR 106. Patient with poor eye contact, tended to " stare" ahead. Did look at clock when asked and stated correct time. Patient required frequent tactile and verbal cues for all mobility. Patient would/could not take a spoon and eat  magic cup. Placed a cup in right hand which he did not hold onto/dropped it. Right UE also noted  To not stay gripped on Rw . Patient required mod/max assist for bed mobility and transfer  To BSC/bed, max tactile cues to stand and sit down. Patient incontinent of BM, assisted to [facility] and had more. RN aware.   No family present to determine prior function and  If patient will have 24/7 assistance.  Pt admitted with above diagnosis.   Pt currently with functional limitations due to the deficits listed below (see PT Problem List). Pt will benefit from skilled PT to increase their independence and safety with mobility to allow discharge to the venue listed below.       Follow Up Recommendations SNF;[facility] - 24 hour w/ HHPT   Equipment Recommendations  None recommended by PT    Recommendations for Other Services   OT    Precautions / Restrictions Precautions Precautions: Fall Precaution Comments: right ue "dropping" items, drops from RW, from arm rest, monitor sats      Mobility  Bed Mobility Overal bed mobility: Needs Assistance Bed Mobility: Supine to Sit;Sit to Supine     Supine to sit: Mod assist Sit to supine: Mod assist   General bed mobility comments: multimodal cues to initiate moving legs to bed  edge, bed pad used to scoot around, mod assist to sit upright, poor initiation and not really following directions.    Transfers Overall transfer level: Needs assistance Equipment used: Rolling walker (2 wheeled) Transfers: Sit to/from [facility] Sit to Stand: Mod assist;Max assist Stand pivot transfers: Mod assist;Max assist       General transfer comment: multimodal cues. to place hands on RW with PT guiding  hands to grips. Mod assistance to stand from bed and BSC. Required constant cues to take steps, PT assisted with turning Rw to get infront of BSC. Max assistance to get patient to sit onto [facility]. Sam to return to bed with much cueing and assistance  Ambulation/Gait             General Gait Details: NT  Stairs            Wheelchair Mobility    Modified Rankin (Stroke Patients Only)       Balance Overall balance assessment: Needs assistance Sitting-balance support: Bilateral upper extremity supported;Feet supported Sitting balance-Leahy Scale: Fair Sitting balance - Comments: close guarding   Standing balance support: Bilateral upper extremity supported;During functional activity Standing balance-Leahy Scale: Poor Standing balance comment: reliant on external support, holding onto Rw while  washed up from BM.                             Pertinent Vitals/Pain Pain Assessment: No/denies pain    Home  Living Family/patient expects to be discharged to:: Private residence Living Arrangements: Children Available Help at Discharge: Family             Additional Comments: unsure, patient does not anser  questions other than his daughter is with him    Prior Function           Comments: unsure.     Hand Dominance        Extremity/Trunk Assessment   Upper Extremity Assessment Upper Extremity Assessment: RUE deficits/detail;LUE deficits/detail RUE Deficits / Details: patient would not hold spoon or cup in right hand  when offered Once cup placed in  hand, patient dropped cup . Noted jerking of the arm, does not hold when raised. Right hand  with decreased grip and support on BSC armrest. LUE Deficits / Details: noted to hold cup and drink from it    Lower Extremity Assessment Lower Extremity Assessment: Generalized weakness    Cervical / Trunk Assessment Cervical / Trunk Assessment: Other exceptions Cervical / Trunk Exceptions: when sitting on bed edge and   on BSC, noted trunk jerking at times..  Communication   Communication: Expressive difficulties;Receptive difficulties  Cognition Arousal/Alertness: Awake/alert Behavior During Therapy: Flat affect Overall Cognitive Status: No family/caregiver present to determine baseline cognitive functioning Area of Impairment: Orientation;Attention;Following commands;Problem solving;Awareness                 Orientation Level: Time;Situation Current Attention Level: Focused   Following Commands: Follows one step commands inconsistently   Awareness: Intellectual Problem Solving: Slow processing;Difficulty sequencing;Requires tactile cues;Requires verbal cues General Comments: patient appears staring forward, does not make eye contact, did look up at clock and read correct time. Responds with short answers yes  and no. and "daughter". requires frequent multimodal cues to mobilize, Hand over hand placement of Hands on RW.      General Comments      Exercises     Assessment/Plan    PT Assessment Patient needs continued PT services  PT Problem List Decreased strength;Decreased balance;Decreased cognition;Decreased knowledge of precautions;Decreased mobility;Decreased activity tolerance;Decreased safety awareness       PT Treatment Interventions DME instruction;Therapeutic activities;Gait training;Cognitive remediation;Therapeutic exercise;Patient/family education;Functional mobility training    PT Goals (Current goals can be found in the Care  Plan section)  Acute Rehab PT Goals PT Goal Formulation: Patient unable to participate in goal setting Time For Goal Achievement: 08/01/20 Potential to Achieve Goals: Fair    Frequency Min 3X/week   Barriers to discharge        Co-evaluation               AM-PAC PT "6 Clicks" Mobility  Outcome Measure Help needed turning from your back to your side while in a flat bed without using bedrails?: A Lot Help needed moving from lying on your back to sitting on the side of a flat bed without using bedrails?: A Lot Help needed moving to and from a bed to a chair (including a wheelchair)?: A Lot Help needed standing up from a chair using your arms (e.g., wheelchair or bedside chair)?: A Lot Help needed to walk in hospital room?: Total Help needed climbing 3-5 steps with a railing? : Total 6 Click Score: 10    End of Session   Activity Tolerance: Patient limited by fatigue;Treatment limited secondary to medical complications (Comment) Patient left: in bed;with call bell/phone within reach;with bed alarm set Nurse Communication: Mobility status (RUE jerking, decreased control.) PT Visit Diagnosis: Difficulty in walking, not  elsewhere classified (R26.2);Unsteadiness on feet (R26.81)    Time: 9242-6834 PT Time Calculation (min) (ACUTE ONLY): 24 min   Charges:   PT Evaluation $PT Eval Low Complexity: 1 Low PT Treatments $Therapeutic Activity: 8-22 mins        Tresa Endo PT Acute Rehabilitation Services Pager [phone] Office [phone]   Claretha Cooper 07/18/2020, 1:58 PM

## 2020-07-18 NOTE — Progress Notes (Signed)
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  Patient said that he took a steroid pill and albuterol.  After about half an hour his symptoms improved a little.  As patient continued to have shortness of breath so he came to ED for further evaluation.  Chest x-ray showed multiple opacities in both lung fields.  Assessment & Plan:   Active Problems:   Dyspnea   1. 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  Patient started on cefepime, Zithromax.  Zithromax was stopped after 5 days.  We will continue with cefepime for total 7 days of therapy.  Stop date for cefepime is 07/18/2020.  Patient started on  Mucinex 1200 mg p.o. twice daily, flutter valve every 4 hours.  Continue DuoNeb nebulizers to scheduled every 6 hours.  SARS-CoV-2 RT-PCR is negative.  He appears mildly fluid overload, will give 1 dose of Lasix 40 mg IV. 2. Dysphagia-patient started on dysphagia 1 diet as per speech therapy. 3. ?  Delirium/anxiety-patient was started on Klonopin 0.5 mg p.o. twice daily however he became confused after 1 dose.  Klonopin was discontinued.  As per family patient has been getting confused intermittently.  At this time patient is not confused he is alert, oriented x3, intact insight and judgment.    MRI of the brain showed no mets.  Previous attending discussed with ENT Dr. Marcelline Deist he says that only treatment is surgery and that also if the fungal ball is eroding the bone was patient having visual changes.  It would not cause mental status changes.  Since patient is asymptomatic, he can follow-up with ENT as outpatient if symptoms arise. 4. Multifocal pneumonia-concern for aspiration, swallow evaluation obtained, patient started on dysphagia 1  diet.  Continue antibiotics as above.  WBC is down to 9000.  Will obtain CT chest without contrast today.  He does have history of bilateral pleural effusions, I have consulted pulmonology to assist with further management .  We will follow CT chest without contrast. 5. History of prostate cancer with lung metastasis-patient followed with oncology as outpatient.  He has been on palliative chemotherapy. 6. Moderate protein calorie malnutrition-dietitian consulted 7. D-dimer elevation-D-dimer is elevated at 4.50, CTA chest was negative for PE, lower extremity venous duplex was negative for DVT.    Estimated body mass index is 22.96 kg/m as calculated from the following:   Height as of this encounter: 5\' 5"  (1.651 m).   Weight as of this encounter: 62.6 kg.  DVT prophylaxis: Heparin  Code Status: DNR  Family Communication:  Discussed with his daughter-in-law in the room with 3 other family members online on the phone.   Consultants: Oncology  Procedures:  Dispo: The patient is from: Home              Anticipated d/c is to: SNF              Anticipated d/c date is: > 3 days              Patient currently is not medically stable to d/c.   Difficult to place patient no  Subjective: Patient resting in bed when I saw him earlier this morning he was awake alert suctioning himself.  Later his family member came up to the room  and was concerned that he is more sleepy and not getting a conversation and was very weak in the upper extremities.  Nursing staff reported that patient was supposed to hold a cup in his hand and he just let it go.  When I saw him for the second time in the room he was 5 x 5 on both upper extremities and moving his lower extremities.  Though his eyes were closed during our conversation with family members he heard everything that I was talking about.  It was decided that we will hold his Remeron and Neurontin to see if his sleepiness would get  better.  Objective: Vitals:   07/17/20 0549 07/17/20 1415 07/17/20 2153 07/18/20 0643  BP: 137/85 118/71 112/63 (!) 145/92  Pulse: (!) 108 (!) 108 95 (!) 108  Resp:  18 20 17   Temp: 99.4 F (37.4 C) 99.3 F (37.4 C) 99.3 F (37.4 C) 99 F (37.2 C)  TempSrc: Oral Oral Oral Oral  SpO2: 98% 99% 97% 97%  Weight:      Height:        Intake/Output Summary (Last 24 hours) at 07/18/2020 1514 Last data filed at 07/18/2020 0700 Gross per 24 hour  Intake --  Output 1500 ml  Net -1500 ml   Filed Weights   07/12/20 0951  Weight: 62.6 kg    Examination:  General exam: Appears calm and comfortable  Respiratory system rhonchi to auscultation. Respiratory effort normal. Cardiovascular system: S1 & S2 heard, RRR. No JVD, murmurs, rubs, gallops or clicks. No pedal edema. Gastrointestinal system: Abdomen is nondistended, soft and nontender. No organomegaly or masses felt. Normal bowel sounds heard. Central nervous system: Alert and oriented. No focal neurological deficits. Extremities: Symmetric 5 x 5 power. Skin: No rashes, lesions or ulcers Psychiatry: Judgement and insight appear normal. Mood & affect appropriate.     Data Reviewed: I have personally reviewed following labs and imaging studies  CBC: Recent Labs  Lab 07/12/20 0523 07/13/20 1100 07/14/20 0500 07/16/20 0527  WBC 16.1* 10.1 9.3 11.6*  NEUTROABS 13.9* 8.1*  --   --   HGB 9.9* 9.8* 9.5* 10.2*  HCT 35.0* 34.4* 34.1* 36.8*  MCV 88.8 90.1 90.9 90.6  PLT 371 341 327 710   Basic Metabolic Panel: Recent Labs  Lab 07/12/20 0523 07/13/20 1100 07/14/20 0500 07/16/20 0527 07/18/20 0541  NA 144 143 143 145 145  K 4.0 4.2 4.1 3.9 3.7  CL 104 102 102 102 99  CO2 31 30 32 33* 36*  GLUCOSE 107* 94 96 111* 122*  BUN 15 13 12 18 19   CREATININE 0.84 0.64 0.62 0.62 0.59*  CALCIUM 8.2* 8.2* 8.0* 8.2* 8.9   GFR: Estimated Creatinine Clearance: 70.5 mL/min (A) (by C-G formula based on SCr of 0.59 mg/dL (L)). Liver  Function Tests: Recent Labs  Lab 07/13/20 1100 07/14/20 0500  AST 20 18  ALT 8 8  ALKPHOS 68 63  BILITOT 0.5 0.4  PROT 6.1* 6.2*  ALBUMIN 2.6* 2.6*   No results for input(s): LIPASE, AMYLASE in the last 168 hours. No results for input(s): AMMONIA in the last 168 hours. Coagulation Profile: Recent Labs  Lab 07/13/20 1100  INR 1.0   Cardiac Enzymes: No results for input(s): CKTOTAL, CKMB, CKMBINDEX, TROPONINI in the last 168 hours. BNP (last 3 results) Recent Labs    03/06/20 1504  PROBNP 78.0   HbA1C: No results for input(s): HGBA1C in the last 72 hours. CBG: No results for input(s): GLUCAP in  the last 168 hours. Lipid Profile: No results for input(s): CHOL, HDL, LDLCALC, TRIG, CHOLHDL, LDLDIRECT in the last 72 hours. Thyroid Function Tests: No results for input(s): TSH, T4TOTAL, FREET4, T3FREE, THYROIDAB in the last 72 hours. Anemia Panel: No results for input(s): VITAMINB12, FOLATE, FERRITIN, TIBC, IRON, RETICCTPCT in the last 72 hours. Sepsis Labs: Recent Labs  Lab 07/12/20 1145 07/13/20 1100  PROCALCITON  --  <0.10  LATICACIDVEN 1.0  --     Recent Results (from the past 240 hour(s))  SARS CORONAVIRUS 2 (TAT 6-24 HRS) Nasopharyngeal Nasopharyngeal Swab     Status: None   Collection Time: 07/12/20  5:23 AM   Specimen: Nasopharyngeal Swab  Result Value Ref Range Status   SARS Coronavirus 2 NEGATIVE NEGATIVE Final    Comment: (NOTE) SARS-CoV-2 target nucleic acids are NOT DETECTED.  The SARS-CoV-2 RNA is generally detectable in upper and lower respiratory specimens during the acute phase of infection. Negative results do not preclude SARS-CoV-2 infection, do not rule out co-infections with other pathogens, and should not be used as the sole basis for treatment or other patient management decisions. Negative results must be combined with clinical observations, patient history, and epidemiological information. The expected result is Negative.  Fact Sheet  for Patients: SugarRoll.be  Fact Sheet for Healthcare Providers: https://www.[employer].com/  This test is not yet approved or cleared by the [place] FDA and  has been authorized for detection and/or diagnosis of SARS-CoV-2 by FDA under an Emergency Use Authorization (EUA). This EUA will remain  in effect (meaning this test can be used) for the duration of the COVID-19 declaration under Se ction 564(b)(1) of the Act, 21 U.S.C. section 360bbb-3(b)(1), unless the authorization is terminated or revoked sooner.  Performed at [facility], [place] [address], [place], [place] [address]   MRSA PCR Screening     Status: None   Collection Time: 07/12/20  9:43 AM   Specimen: Nasopharyngeal  Result Value Ref Range Status   MRSA by PCR NEGATIVE NEGATIVE Final    Comment:        The GeneXpert MRSA Assay (FDA approved for NASAL specimens only), is one component of a comprehensive MRSA colonization surveillance program. It is not intended to diagnose MRSA infection nor to guide or monitor treatment for MRSA infections. Performed at [facility], [place] [address] [place], [place] [address]   Culture, blood (routine x 2)     Status: None   Collection Time: 07/12/20 11:45 AM   Specimen: BLOOD  Result Value Ref Range Status   Specimen Description   Final    BLOOD RIGHT ANTECUBITAL Performed at [place] [address] [place], [place] [address]    Special Requests   Final    BOTTLES DRAWN AEROBIC AND ANAEROBIC Blood Culture adequate volume Performed at [place] [address] [place], [place] [address]    Culture   Final    NO GROWTH 5 DAYS Performed at [facility], [place] [address], [place], [place] [address]    Report Status 07/17/2020 FINAL  Final  Culture, blood (routine x 2)     Status: None   Collection Time: 07/12/20 11:45 AM   Specimen: BLOOD  Result  Value Ref Range Status   Specimen Description   Final    BLOOD BLOOD RIGHT FOREARM Performed at [place] [address] [place],  [address]    Special Requests   Final  BOTTLES DRAWN AEROBIC AND ANAEROBIC Blood Culture adequate volume Performed at [place] [address], [place], [place] [address]    Culture   Final    NO GROWTH 5 DAYS Performed at [facility], [place] [address] [place], [place] [address]    Report Status 07/17/2020 FINAL  Final         Radiology Studies: CT CHEST WO CONTRAST  Result Date: 07/17/2020 CLINICAL DATA:  Suspected pneumonia. EXAM: CT CHEST WITHOUT CONTRAST TECHNIQUE: Multidetector CT imaging of the chest was performed following the standard protocol without IV contrast. COMPARISON:  July 12, 2020 FINDINGS: Cardiovascular: RIGHT-sided Port-A-Cath terminates at the caval to atrial junction. Dilation of the thoracic aorta to 3.8 cm similar to prior studies. Calcified and noncalcified atheromatous plaque in the thoracic aorta. Heart size stable without pericardial effusion. Three-vessel coronary artery calcification. Central pulmonary vasculature remains engorged measuring up to 4 cm greatest caliber similar to prior imaging. Limited assessment of cardiovascular structures given lack of intravenous contrast. Mediastinum/Nodes: No axillary lymphadenopathy. No mediastinal lymphadenopathy. No gross hilar lymphadenopathy. Assessment is quite limited due to lack of contrast. Thoracic inlet structures grossly normal. Lungs/Pleura: Ground-glass attenuation in the chest, septal thickening and peripheral areas of consolidation that have worsened since December of 2021 and are associated with large RIGHT and moderate LEFT effusions. Consolidative process in the chest is very similar to the study of July 13, 2019 worse at the lung bases. Ground-glass opacity in the LEFT upper chest. This is stable compared to  the recent comparison and new compared to the study of May 24, 2020. Peribronchial thickening and soft tissue in the lower chest may be slightly worse than on prior studies. The airways are patent centrally and associated with air bronchograms in the lower lobes. This phenomenon may be worse towards the central portion of the chest in the lung bases since the most recent comparison. Upper Abdomen: No acute upper abdominal process. No upper abdominal lymphadenopathy Musculoskeletal: Multifocal skeletal metastases with sclerosis IMPRESSION: 1. Slight worsening of airspace and interstitial process in the chest compared to recent imaging,particularly about bronchovascular structures at the lung bases and moderate to marked worsening since the exam of May 24, 2020 . Likely a combination of metastatic disease and pneumonia and or pneumonitis. Correlate with any drug related risk factors for pneumonitis. While superimposed infection or inflammation is considered. 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 2. Similar appearance of bilateral effusions RIGHT greater than LEFT. 3. Multifocal sclerotic skeletal metastatic disease as on previous imaging. 4. Three-vessel coronary artery calcification. 5. Aortic atherosclerosis. Aortic Atherosclerosis (ICD10-I70.0). Electronically Signed   By: Zetta Bills M.D.   On: 07/17/2020 13:56   MR BRAIN WO CONTRAST  Result Date: 07/17/2020 CLINICAL DATA:  Mental status changes of unknown cause. EXAM: MRI HEAD WITHOUT CONTRAST TECHNIQUE: Multiplanar, multiecho pulse sequences of the brain and surrounding structures were obtained without intravenous contrast. COMPARISON:  Head CT 05/25/2020 FINDINGS: Brain: Diffusion imaging does not show any acute or subacute infarction. The brain does not show accelerated atrophy. There is no evidence of old small or large vessel infarction. No intracranial mass lesion, hemorrhage, hydrocephalus or extra-axial  collection. Vascular: Major vessels at the base of the brain show flow. Skull and upper cervical spine: Negative Sinuses/Orbits: Opacification of the sphenoid sinus with a T2 hypointense structure that could be a fungus ball. More ordinary appearing mucosal thickening of the right maxillary sinus. Orbits negative. Other: None IMPRESSION: 1. Normal appearance of  the brain for age. 2. Opacification of the sphenoid sinus with a T2 hypointense structure that could be a fungus ball raising the possibility of chronic fungal sphenoid sinusitis. More ordinary appearing mucosal thickening of the right maxillary sinus. Electronically Signed   By: Nelson Chimes M.D.   On: 07/17/2020 14:05   DG Chest Port 1V same Day  Result Date: 07/17/2020 CLINICAL DATA:  History of lung metastatic disease with shortness of breath EXAM: PORTABLE CHEST 1 VIEW COMPARISON:  07/11/2020 FINDINGS: Cardiac shadow is stable. Right chest wall port is again seen. Increasing right-sided pleural effusion is noted. Metastatic disease in the lungs is noted bilaterally stable from the prior exam. Increased density is noted throughout the bony structures consistent with metastatic disease. IMPRESSION: Enlarging right-sided pleural effusion. Changes consistent with prostate metastatic disease. Electronically Signed   By: Inez Catalina M.D.   On: 07/17/2020 12:20        Scheduled Meds: . Chlorhexidine Gluconate Cloth  6 each Topical Daily  . feeding supplement  237 mL Oral BID  . guaiFENesin  15 mL Oral Q4H  . heparin  5,000 Units Subcutaneous Q8H   Continuous Infusions: . ceFEPime (MAXIPIME) IV 2 g (07/18/20 1007)     LOS: 6 days   Georgette Shell, MD 07/18/2020, 3:14 PM

## 2020-07-19 ENCOUNTER — Inpatient Hospital Stay: Payer: Medicare Other

## 2020-07-19 MED ORDER — ONDANSETRON HCL 4 MG PO TABS: 4.0000 mg | ORAL_TABLET | Freq: Four times a day (QID) | ORAL | 0 refills | Status: AC | PRN

## 2020-07-19 MED ORDER — IPRATROPIUM-ALBUTEROL 0.5-2.5 (3) MG/3ML IN SOLN: 3.0000 mL | RESPIRATORY_TRACT | 0 refills | Status: AC | PRN

## 2020-07-19 NOTE — Evaluation (Signed)
Occupational Therapy Evaluation Patient Details Name: [name] Mathews MRN: [id number] DOB: 1945-08-16 Today's Date: 07/19/2020    History of Present Illness 75 y.o. 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 Presenting with dyspnea on 07/12/20.Chest x-ray showed multiple opacities in both lung fields. Patient on 3-4 L at home.   Clinical Impression   Mr. Cyril Woodmansee is a 75 year old man with above medical history with above medical history who presents with generalized weakness, decreased activity tolerance, and impaired cardiopulmonary status requiring 5 L [place]. On evaluation patient supervision to transfer to side of bed and min guard to ambulate short distance. Patient min guard to min assist for ADLs. Patient requiring rest breaks with limited activity. Unable to ascertain o2 saturation due to lack of monitoring device - o2 sat not functioning on telemetry and no dynamap available. Patient did not complain of shortness of breath - just rest breaks. Patient will benefit from skilled OT services while in hospital to improve deficits and learn compensatory strategies as needed in order to return home at discharge.      Follow Up Recommendations  No OT follow up    Equipment Recommendations  None recommended by OT    Recommendations for Other Services       Precautions / Restrictions Precautions Precautions: Fall Restrictions Weight Bearing Restrictions: No      Mobility Bed Mobility Overal bed mobility: Needs Assistance Bed Mobility: Supine to Sit     Supine to sit: Supervision     General bed mobility comments: supervision to transfer to edge of bed.    Transfers Overall transfer level: Needs assistance Equipment used: Rolling walker (2 wheeled) Transfers: Sit to/from [facility] Sit to Stand: Min guard Stand pivot transfers: Min guard       General transfer comment: Min guard to stand and ambulate short  distance in room with RW. Distance Limited by o2 line.    Balance Overall balance assessment: Mild deficits observed, not formally tested Sitting-balance support: No upper extremity supported Sitting balance-Leahy Scale: Good     Standing balance support: During functional activity Standing balance-Leahy Scale: Fair Standing balance comment: able to remove hands off of walker                           ADL either performed or assessed with clinical judgement   ADL Overall ADL's : Needs assistance/impaired Eating/Feeding: Set up   Grooming: Set up   Upper Body Bathing: Set up;Sitting   Lower Body Bathing: Minimal assistance;Sit to/from stand;Set up   Upper Body Dressing : Set up;Sitting   Lower Body Dressing: Set up;Sit to/from stand;Min guard Lower Body Dressing Details (indicate cue type and reason): able to don socks at edge of bed using figure four position. Toilet Transfer: Manufacturing engineer and Hygiene: Min guard;Sit to/from stand               Vision   Vision Assessment?: No apparent visual deficits     Perception     Praxis      Pertinent Vitals/Pain Pain Assessment: No/denies pain     Hand Dominance Right   Extremity/Trunk Assessment Upper Extremity Assessment Upper Extremity Assessment: Overall WFL for tasks assessed   Lower Extremity Assessment Lower Extremity Assessment: Defer to PT evaluation   Cervical / Trunk Assessment Cervical / Trunk Assessment: Normal   Communication Communication Communication: Expressive difficulties;Receptive difficulties;Other (comment) (  Questionably hard of hearing. Therapist had to repeat herself multiple times. Patient at times would not respond to questions. Needed increased time to respond and formulate answers.)   Cognition Arousal/Alertness: Awake/alert Behavior During Therapy: Flat affect Overall Cognitive Status: Difficult to assess                                  General Comments: Increased time and consistently repeating questions to get answers. Grossly functional cognition.   General Comments       Exercises     Shoulder Instructions      Home Living Family/patient expects to be discharged to:: Private residence Living Arrangements: Children Available Help at Discharge: Family   Home Access: Stairs to enter Technical brewer of Steps: 3-4   Home Layout: One level               Home Equipment: Clinical cytogeneticist - 2 wheels   Additional Comments: Unreliable historian: Reports his daughter lives with him. Reports he is independent with ADLs. Reports having a walker but doesn't use it. Sometimes performs sponge baths sometimes gets into the shower.      Prior Functioning/Environment          Comments: unsure.        OT Problem List: Decreased activity tolerance;Impaired balance (sitting and/or standing);Decreased strength;Cardiopulmonary status limiting activity      OT Treatment/Interventions: Self-care/ADL training;Therapeutic exercise;DME and/or AE instruction;Therapeutic activities;Patient/family education;Balance training    OT Goals(Current goals can be found in the care plan section) Acute Rehab OT Goals Patient Stated Goal: to go home OT Goal Formulation: With patient Time For Goal Achievement: 08/02/20 Potential to Achieve Goals: Good  OT Frequency: Min 2X/week   Barriers to D/C:            Co-evaluation              AM-PAC OT "6 Clicks" Daily Activity     Outcome Measure Help from another person eating meals?: A Little Help from another person taking care of personal grooming?: A Little Help from another person toileting, which includes using toliet, bedpan, or urinal?: A Little Help from another person bathing (including washing, rinsing, drying)?: A Little Help from another person to put on and taking off regular upper body clothing?: A Little Help from another  person to put on and taking off regular lower body clothing?: A Little 6 Click Score: 18   End of Session Equipment Utilized During Treatment: Rolling walker Nurse Communication: Mobility status (o2 sat not reading on telemetry)  Activity Tolerance: Patient tolerated treatment well Patient left: in chair;with call bell/phone within reach;with chair alarm set  OT Visit Diagnosis: Muscle weakness (generalized) (M62.81)                Time: 0102-7253 OT Time Calculation (min): 25 min Charges:  OT General Charges $OT Visit: 1 Visit OT Evaluation $OT Eval Moderate Complexity: 1 Mod  Fiorela Pelzer, OTR/L [place]  Office [phone] Pager: [phone]   Lenward Chancellor 07/19/2020, 3:05 PM

## 2020-07-19 NOTE — Discharge Summary (Signed)
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 Patient said that he took a steroid pill and albuterol. After about half an hour his symptoms improved a little. As patient continued to have shortness of breath so he came to ED for further evaluation. Chest x-ray showed multiple opacities in both lung fields  Discharge Diagnoses:  Active Problems:   Dyspnea  #1 acute on chronic hypoxic respiratory failure in the setting metastatic prostate cancer with mets to the lungs.  He was initially treated with cefepime and azithromycin.  He was Covid negative.  His symptoms did not improve.  He is continued to require high oxygen demand.  He was seen by speech therapy and was started on dysphagia 1 diet for concern for aspiration.  His chest x-rays continued to show bilateral infiltrates.  He was seen by PCCM.    #2 metastatic prostate cancer followed by Dr. Alen Blew.  With advanced prostate cancer with lymphangitic spread and possible involvement of the lungs.  He has been receiving palliative chemotherapy.  However his PSA has been increasing.  CT scan on 07/17/2020 showed worsening interstitial and airspace disease which could indicate disease progression.  MRI of the brain did not reveal any acute findings.  His overall prognosis was poor given the disease progression by PSA criteria and worsening pulmonary status.  He was discharged home with hospice.  And he was a DNR during the hospital stay.   This was discussed in detail with patient's family.   Estimated body mass index is 22.96 kg/m as calculated from the following:   Height as of this encounter: 5\' 5"  (1.651 m).   Weight as of this encounter: 62.6 kg.  Discharge Instructions  Discharge Instructions    Diet - low sodium heart healthy   Complete by: As directed    Increase activity slowly   Complete by: As directed      Allergies as of 07/19/2020      Reactions   Bee Venom Anaphylaxis, Shortness Of Breath, Swelling   Tongue swelling.    Wasp Venom Anaphylaxis, Shortness Of Breath, Swelling   Tongue swelling   Percocet [oxycodone-acetaminophen] Palpitations      Medication List    STOP taking these medications   dexamethasone 4 MG tablet Commonly known as: DECADRON     TAKE these medications   albuterol 108 (90 Base) MCG/ACT inhaler Commonly known as: VENTOLIN HFA Inhale 1-2 puffs into the lungs every 6 (six) hours as needed for wheezing or shortness of breath.   denosumab 120 MG/1.7ML Soln injection Commonly known as: XGEVA Inject 120 mg into the skin every 30 (thirty) days.   Ensure Active High Protein Liqd Use 3 cans daily. What changed:   how much to take  how to take this  when to take this  additional instructions   gabapentin 300 MG capsule Commonly known as: NEURONTIN Take 1 capsule (300 mg total) by mouth at bedtime.   leuprolide 30 MG injection Commonly known as: LUPRON Inject 30 mg into the muscle every 4 (four) months.   mirtazapine 15 MG tablet Commonly  known as: REMERON TAKE 1 TABLET BY MOUTH AT BEDTIME   ondansetron 4 MG tablet Commonly known as: ZOFRAN Take 1 tablet (4 mg total) by mouth every 6 (six) hours as needed for nausea.       Allergies  Allergen Reactions  . Bee Venom Anaphylaxis, Shortness Of Breath and Swelling    Tongue swelling.   . Wasp Venom Anaphylaxis, Shortness Of Breath and Swelling    Tongue swelling   . Percocet [Oxycodone-Acetaminophen]  Palpitations    Consultations: Oncology and PCCM.  Procedures/Studies: DG Chest 2 View  Result Date: 07/11/2020 CLINICAL DATA:  Shortness of breath EXAM: CHEST - 2 VIEW COMPARISON:  May 25, 2020 FINDINGS: The heart size and mediastinal contours are within normal limits. Again noted are multifocal areas of airspace opacities and nodular opacities. There are bilateral pleural effusions present. Diffuse sclerosis seen throughout the visualized osseous structures. A right-sided MediPort catheter seen with the tip at the superior cavoatrial junction. IMPRESSION: No significant change in the multifocal airspace opacities, likely combination of consolidation and pulmonary nodules. Unchanged up bilateral pleural effusions Electronically Signed   By: Prudencio Pair M.D.   On: 07/11/2020 22:33   CT CHEST WO CONTRAST  Result Date: 07/17/2020 CLINICAL DATA:  Suspected pneumonia. EXAM: CT CHEST WITHOUT CONTRAST TECHNIQUE: Multidetector CT imaging of the chest was performed following the standard protocol without IV contrast. COMPARISON:  July 12, 2020 FINDINGS: Cardiovascular: RIGHT-sided Port-A-Cath terminates at the caval to atrial junction. Dilation of the thoracic aorta to 3.8 cm similar to prior studies. Calcified and noncalcified atheromatous plaque in the thoracic aorta. Heart size stable without pericardial effusion. Three-vessel coronary artery calcification. Central pulmonary vasculature remains engorged measuring up to 4 cm greatest caliber similar to prior imaging. Limited assessment of cardiovascular structures given lack of intravenous contrast. Mediastinum/Nodes: No axillary lymphadenopathy. No mediastinal lymphadenopathy. No gross hilar lymphadenopathy. Assessment is quite limited due to lack of contrast. Thoracic inlet structures grossly normal. Lungs/Pleura: Ground-glass attenuation in the chest, septal thickening and peripheral areas of consolidation that have worsened since December of 2021  and are associated with large RIGHT and moderate LEFT effusions. Consolidative process in the chest is very similar to the study of July 13, 2019 worse at the lung bases. Ground-glass opacity in the LEFT upper chest. This is stable compared to the recent comparison and new compared to the study of May 24, 2020. Peribronchial thickening and soft tissue in the lower chest may be slightly worse than on prior studies. The airways are patent centrally and associated with air bronchograms in the lower lobes. This phenomenon may be worse towards the central portion of the chest in the lung bases since the most recent comparison. Upper Abdomen: No acute upper abdominal process. No upper abdominal lymphadenopathy Musculoskeletal: Multifocal skeletal metastases with sclerosis IMPRESSION: 1. Slight worsening of airspace and interstitial process in the chest compared to recent imaging,particularly about bronchovascular structures at the lung bases and moderate to marked worsening since the exam of May 24, 2020 . Likely a combination of metastatic disease and pneumonia and or pneumonitis. Correlate with any drug related risk factors for pneumonitis. While superimposed infection or inflammation is considered. 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 2. Similar appearance of bilateral effusions RIGHT greater than LEFT. 3. Multifocal sclerotic skeletal metastatic disease as on previous imaging. 4. Three-vessel coronary artery calcification. 5. Aortic atherosclerosis. Aortic Atherosclerosis (ICD10-I70.0). Electronically Signed   By: Zetta Bills M.D.   On: 07/17/2020 13:56   CT  ANGIO CHEST PE W OR WO CONTRAST  Result Date: 07/12/2020 CLINICAL DATA:  Shortness of breath. EXAM: CT ANGIOGRAPHY CHEST WITH CONTRAST TECHNIQUE: Multidetector CT imaging of the chest was performed using the standard protocol during bolus administration of intravenous contrast. Multiplanar CT image  reconstructions and MIPs were obtained to evaluate the vascular anatomy. CONTRAST:  14mL OMNIPAQUE IOHEXOL 350 MG/ML SOLN COMPARISON:  May 25, 2020 FINDINGS: Cardiovascular: A right-sided venous Port-A-Cath is seen. Mild to moderate severity calcification of the aortic arch is noted. Satisfactory opacification of the pulmonary arteries to the segmental level. No evidence of pulmonary embolism. Normal heart size. No pericardial effusion. Mediastinum/Nodes: No enlarged mediastinal, hilar, or axillary lymph nodes. Thyroid gland, trachea, and esophagus demonstrate no significant findings. Lungs/Pleura: Marked severity infiltrates are seen throughout both lungs. This is increased in severity when compared to the prior study. Predominant stable moderate sized bilateral pleural effusions are noted. No pneumothorax is identified. Upper Abdomen: No acute abnormality. Musculoskeletal: Multiple sclerotic foci of various sizes are seen scattered throughout the osseous skeleton. Review of the MIP images confirms the above findings. IMPRESSION: 1. Marked severity infiltrates throughout both lungs. 2. Moderate sized bilateral pleural effusions. 3. Multiple sclerotic foci of various sizes scattered throughout the osseous skeleton, consistent with osseous metastasis. 4. Aortic atherosclerosis. Aortic Atherosclerosis (ICD10-I70.0). Electronically Signed   By: Virgina Norfolk M.D.   On: 07/12/2020 15:16   MR BRAIN WO CONTRAST  Result Date: 07/17/2020 CLINICAL DATA:  Mental status changes of unknown cause. EXAM: MRI HEAD WITHOUT CONTRAST TECHNIQUE: Multiplanar, multiecho pulse sequences of the brain and surrounding structures were obtained without intravenous contrast. COMPARISON:  Head CT 05/25/2020 FINDINGS: Brain: Diffusion imaging does not show any acute or subacute infarction. The brain does not show accelerated atrophy. There is no evidence of old small or large vessel infarction. No intracranial mass lesion,  hemorrhage, hydrocephalus or extra-axial collection. Vascular: Major vessels at the base of the brain show flow. Skull and upper cervical spine: Negative Sinuses/Orbits: Opacification of the sphenoid sinus with a T2 hypointense structure that could be a fungus ball. More ordinary appearing mucosal thickening of the right maxillary sinus. Orbits negative. Other: None IMPRESSION: 1. Normal appearance of the brain for age. 2. Opacification of the sphenoid sinus with a T2 hypointense structure that could be a fungus ball raising the possibility of chronic fungal sphenoid sinusitis. More ordinary appearing mucosal thickening of the right maxillary sinus. Electronically Signed   By: Nelson Chimes M.D.   On: 07/17/2020 14:05   DG Chest Port 1V same Day  Result Date: 07/17/2020 CLINICAL DATA:  History of lung metastatic disease with shortness of breath EXAM: PORTABLE CHEST 1 VIEW COMPARISON:  07/11/2020 FINDINGS: Cardiac shadow is stable. Right chest wall port is again seen. Increasing right-sided pleural effusion is noted. Metastatic disease in the lungs is noted bilaterally stable from the prior exam. Increased density is noted throughout the bony structures consistent with metastatic disease. IMPRESSION: Enlarging right-sided pleural effusion. Changes consistent with prostate metastatic disease. Electronically Signed   By: Inez Catalina M.D.   On: 07/17/2020 12:20   VAS [place] LOWER EXTREMITY VENOUS (DVT)  Result Date: 07/12/2020  Lower Venous DVT Study Indications: SOB.  Risk Factors: Cancer Metastatic CA COPD on home O2. Performing Technologist: Rogelia Rohrer  Examination Guidelines: A complete evaluation includes B-mode imaging, spectral Doppler, color Doppler, and power Doppler as needed of all accessible portions of each vessel. Bilateral testing is considered an integral part of a complete examination. Limited examinations  for reoccurring indications may be performed as noted. The reflux portion of the exam is  performed with the patient in reverse Trendelenburg.  +---------+---------------+---------+-----------+----------+--------------+ RIGHT    CompressibilityPhasicitySpontaneityPropertiesThrombus Aging +---------+---------------+---------+-----------+----------+--------------+ CFV      Full           Yes      Yes                                 +---------+---------------+---------+-----------+----------+--------------+ SFJ      Full                                                        +---------+---------------+---------+-----------+----------+--------------+ FV Prox  Full           Yes      Yes                                 +---------+---------------+---------+-----------+----------+--------------+ FV Mid   Full           Yes      Yes                                 +---------+---------------+---------+-----------+----------+--------------+ FV DistalFull           Yes      Yes                                 +---------+---------------+---------+-----------+----------+--------------+ PFV      Full                                                        +---------+---------------+---------+-----------+----------+--------------+ POP      Full           Yes      Yes                                 +---------+---------------+---------+-----------+----------+--------------+ PTV      Full                                                        +---------+---------------+---------+-----------+----------+--------------+ PERO     Full                                                        +---------+---------------+---------+-----------+----------+--------------+   +---------+---------------+---------+-----------+----------+--------------+ LEFT     CompressibilityPhasicitySpontaneityPropertiesThrombus Aging +---------+---------------+---------+-----------+----------+--------------+ CFV      Full           Yes      Yes                                  +---------+---------------+---------+-----------+----------+--------------+  SFJ      Full                                                        +---------+---------------+---------+-----------+----------+--------------+ FV Prox  Full           Yes      Yes                                 +---------+---------------+---------+-----------+----------+--------------+ FV Mid   Full           Yes      Yes                                 +---------+---------------+---------+-----------+----------+--------------+ FV DistalFull           Yes      Yes                                 +---------+---------------+---------+-----------+----------+--------------+ PFV      Full                                                        +---------+---------------+---------+-----------+----------+--------------+ POP      Full           Yes      Yes                                 +---------+---------------+---------+-----------+----------+--------------+ PTV      Full                                                        +---------+---------------+---------+-----------+----------+--------------+ PERO     Full                                                        +---------+---------------+---------+-----------+----------+--------------+     Summary: BILATERAL: - No evidence of deep vein thrombosis seen in the lower extremities, bilaterally. -No evidence of popliteal cyst, bilaterally.   *See table(s) above for measurements and observations. Electronically signed by Servando Snare MD on 07/12/2020 at 2:26:53 PM.    Final     (Echo, Carotid, EGD, Colonoscopy, ERCP)    Subjective: He is resting in bed somewhat confused moves all extremities on 5 L of oxygen  Discharge Exam: Vitals:   07/18/20 2054 07/19/20 0546  BP: 137/85 (!) 150/85  Pulse: 98 100  Resp: 18 18  Temp: 98.3 F (36.8 C) 98 F (36.7 C)  SpO2: 98% 99%   Vitals:   07/18/20 0643 07/18/20 1626  07/18/20 2054 07/19/20 0546  BP: [name])  145/92 (!) 141/76 137/85 (!) 150/85  Pulse: (!) 108 (!) 101 98 100  Resp: 17  18 18   Temp: 99 F (37.2 C) 98.9 F (37.2 C) 98.3 F (36.8 C) 98 F (36.7 C)  TempSrc: Oral Oral Oral Oral  SpO2: 97% 97% 98% 99%  Weight:      Height:        General: Pt is resting in bed not in acute distress Cardiovascular: RRR, S1/S2 +, no rubs, no gallops Respiratory: Coarse breath sounds bilaterally bilaterally, no wheezing, no rhonchi Abdominal: Soft, NT, ND, bowel sounds + Extremities: no edema, no cyanosis    The results of significant diagnostics from this hospitalization (including imaging, microbiology, ancillary and laboratory) are listed below for reference.     Microbiology: Recent Results (from the past 240 hour(s))  SARS CORONAVIRUS 2 (TAT 6-24 HRS) Nasopharyngeal Nasopharyngeal Swab     Status: None   Collection Time: 07/12/20  5:23 AM   Specimen: Nasopharyngeal Swab  Result Value Ref Range Status   SARS Coronavirus 2 NEGATIVE NEGATIVE Final    Comment: (NOTE) SARS-CoV-2 target nucleic acids are NOT DETECTED.  The SARS-CoV-2 RNA is generally detectable in upper and lower respiratory specimens during the acute phase of infection. Negative results do not preclude SARS-CoV-2 infection, do not rule out co-infections with other pathogens, and should not be used as the sole basis for treatment or other patient management decisions. Negative results must be combined with clinical observations, patient history, and epidemiological information. The expected result is Negative.  Fact Sheet for Patients: SugarRoll.be  Fact Sheet for Healthcare Providers: https://www.[employer].com/  This test is not yet approved or cleared by the [place] FDA and  has been authorized for detection and/or diagnosis of SARS-CoV-2 by FDA under an Emergency Use Authorization (EUA). This EUA will remain  in effect  (meaning this test can be used) for the duration of the COVID-19 declaration under Se ction 564(b)(1) of the Act, 21 U.S.C. section 360bbb-3(b)(1), unless the authorization is terminated or revoked sooner.  Performed at [facility], [place] [address] [place], [place] [address]   MRSA PCR Screening     Status: None   Collection Time: 07/12/20  9:43 AM   Specimen: Nasopharyngeal  Result Value Ref Range Status   MRSA by PCR NEGATIVE NEGATIVE Final    Comment:        The GeneXpert MRSA Assay (FDA approved for NASAL specimens only), is one component of a comprehensive MRSA colonization surveillance program. It is not intended to diagnose MRSA infection nor to guide or monitor treatment for MRSA infections. Performed at [facility], [place] [address], [place], [place] [address]   Culture, blood (routine x 2)     Status: None   Collection Time: 07/12/20 11:45 AM   Specimen: BLOOD  Result Value Ref Range Status   Specimen Description   Final    BLOOD RIGHT ANTECUBITAL Performed at [place] [address] [place], [place] [address]    Special Requests   Final    BOTTLES DRAWN AEROBIC AND ANAEROBIC Blood Culture adequate volume Performed at [place] [address] [place], [place] [address]    Culture   Final    NO GROWTH 5 DAYS Performed at [facility], [place] [address] [place], [place] [address]    Report Status 07/17/2020 FINAL  Final  Culture, blood (routine x 2)     Status: None   Collection  Time: 07/12/20 11:45 AM   Specimen: BLOOD  Result Value Ref Range Status   Specimen Description   Final    BLOOD BLOOD RIGHT FOREARM Performed at [place] [address], [place], [place] [address]    Special Requests   Final    BOTTLES DRAWN AEROBIC AND ANAEROBIC Blood Culture adequate volume Performed at [place] [address], [place], [place] [address]     Culture   Final    NO GROWTH 5 DAYS Performed at [facility], [place] [address] [place], [place] [address]    Report Status 07/17/2020 FINAL  Final     Labs: BNP (last 3 results) Recent Labs    03/07/20 1210  BNP [id number]   Basic Metabolic Panel: Recent Labs  Lab 07/13/20 1100 07/14/20 0500 07/16/20 0527 07/18/20 0541  NA 143 143 145 145  K 4.2 4.1 3.9 3.7  CL 102 102 102 99  CO2 30 32 33* 36*  GLUCOSE 94 96 111* 122*  BUN 13 12 18 19   CREATININE 0.64 0.62 0.62 0.59*  CALCIUM 8.2* 8.0* 8.2* 8.9   Liver Function Tests: Recent Labs  Lab 07/13/20 1100 07/14/20 0500  AST 20 18  ALT 8 8  ALKPHOS 68 63  BILITOT 0.5 0.4  PROT 6.1* 6.2*  ALBUMIN 2.6* 2.6*   No results for input(s): LIPASE, AMYLASE in the last 168 hours. No results for input(s): AMMONIA in the last 168 hours. CBC: Recent Labs  Lab 07/13/20 1100 07/14/20 0500 07/16/20 0527  WBC 10.1 9.3 11.6*  NEUTROABS 8.1*  --   --   HGB 9.8* 9.5* 10.2*  HCT 34.4* 34.1* 36.8*  MCV 90.1 90.9 90.6  PLT 341 327 353   Cardiac Enzymes: No results for input(s): CKTOTAL, CKMB, CKMBINDEX, TROPONINI in the last 168 hours. BNP: Invalid input(s): POCBNP CBG: No results for input(s): GLUCAP in the last 168 hours. D-Dimer No results for input(s): DDIMER in the last 72 hours. Hgb A1c No results for input(s): HGBA1C in the last 72 hours. Lipid Profile No results for input(s): CHOL, HDL, LDLCALC, TRIG, CHOLHDL, LDLDIRECT in the last 72 hours. Thyroid function studies No results for input(s): TSH, T4TOTAL, T3FREE, THYROIDAB in the last 72 hours.  Invalid input(s): FREET3 Anemia work up No results for input(s): VITAMINB12, FOLATE, FERRITIN, TIBC, IRON, RETICCTPCT in the last 72 hours. Urinalysis    Component Value Date/Time   COLORURINE YELLOW 05/25/2020 1522   APPEARANCEUR CLEAR 05/25/2020 1522   LABSPEC 1.024 05/25/2020 1522   PHURINE 7.0 05/25/2020 1522   GLUCOSEU NEGATIVE 05/25/2020 1522   HGBUR  NEGATIVE 05/25/2020 1522   BILIRUBINUR NEGATIVE 05/25/2020 1522   [place] 05/25/2020 1522   PROTEINUR 30 (A) 05/25/2020 1522   NITRITE NEGATIVE 05/25/2020 1522   LEUKOCYTESUR NEGATIVE 05/25/2020 1522   Sepsis Labs Invalid input(s): PROCALCITONIN,  WBC,  LACTICIDVEN Microbiology Recent Results (from the past 240 hour(s))  SARS CORONAVIRUS 2 (TAT 6-24 HRS) Nasopharyngeal Nasopharyngeal Swab     Status: None   Collection Time: 07/12/20  5:23 AM   Specimen: Nasopharyngeal Swab  Result Value Ref Range Status   SARS Coronavirus 2 NEGATIVE NEGATIVE Final    Comment: (NOTE) SARS-CoV-2 target nucleic acids are NOT DETECTED.  The SARS-CoV-2 RNA is generally detectable in upper and lower respiratory specimens during the acute phase of infection. Negative results do not preclude SARS-CoV-2 infection, do not rule out co-infections with other pathogens, and should not be used as the  sole basis for treatment or other patient management decisions. Negative results must be combined with clinical observations, patient history, and epidemiological information. The expected result is Negative.  Fact Sheet for Patients: SugarRoll.be  Fact Sheet for Healthcare Providers: https://www.woods-[employer].com/  This test is not yet approved or cleared by the [place] FDA and  has been authorized for detection and/or diagnosis of SARS-CoV-2 by FDA under an Emergency Use Authorization (EUA). This EUA will remain  in effect (meaning this test can be used) for the duration of the COVID-19 declaration under Se ction 564(b)(1) of the Act, 21 U.S.C. section 360bbb-3(b)(1), unless the authorization is terminated or revoked sooner.  Performed at [facility], [place] [address] [place], [place] [address]   MRSA PCR Screening     Status: None   Collection Time: 07/12/20  9:43 AM   Specimen: Nasopharyngeal  Result Value Ref Range Status   MRSA by PCR  NEGATIVE NEGATIVE Final    Comment:        The GeneXpert MRSA Assay (FDA approved for NASAL specimens only), is one component of a comprehensive MRSA colonization surveillance program. It is not intended to diagnose MRSA infection nor to guide or monitor treatment for MRSA infections. Performed at [facility], [place] [address] [place], [place] [address]   Culture, blood (routine x 2)     Status: None   Collection Time: 07/12/20 11:45 AM   Specimen: BLOOD  Result Value Ref Range Status   Specimen Description   Final    BLOOD RIGHT ANTECUBITAL Performed at [place] [address] [place], [place] [address]    Special Requests   Final    BOTTLES DRAWN AEROBIC AND ANAEROBIC Blood Culture adequate volume Performed at [place] [address] [place], [place] [address]    Culture   Final    NO GROWTH 5 DAYS Performed at [facility], [place] [address] [place], [place] [address]    Report Status 07/17/2020 FINAL  Final  Culture, blood (routine x 2)     Status: None   Collection Time: 07/12/20 11:45 AM   Specimen: BLOOD  Result Value Ref Range Status   Specimen Description   Final    BLOOD BLOOD RIGHT FOREARM Performed at [facility] [place] [address] [place], [place] [address]    Special Requests   Final    BOTTLES DRAWN AEROBIC AND ANAEROBIC Blood Culture adequate volume Performed at [facility] [address] [place], [place] [address]    Culture   Final    NO GROWTH 5 DAYS Performed at [facility], [place] [address] [name], [place] [address]    Report Status 07/17/2020 FINAL  Final     Time coordinating discharge: 38 minutes  SIGNED:   Georgette Shell, MD  [facility] 07/19/2020, 2:47 PM

## 2020-07-19 NOTE — Care Management Important Message (Signed)
Important Message  Patient Details IM Letter given to Patient. Name: [name] Mathews MRN: [id number] Date of Birth: May 30, 1946   Medicare Important Message Given:  Yes     Kerin Salen 07/19/2020, 10:10 AM

## 2020-07-19 NOTE — Progress Notes (Signed)
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  Patient said that he took a steroid pill and albuterol.  After about half an hour his symptoms improved a little.  As patient continued to have shortness of breath so he came to ED for further evaluation.  Chest x-ray showed multiple opacities in both lung fields.  Assessment & Plan:   Active Problems:   Dyspnea   1. 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  Patient started on cefepime, Zithromax.  Zithromax was stopped after 5 days.  We will continue with cefepime for total 7 days of therapy.  Stop date for cefepime is 07/18/2020.  Patient started on  Mucinex 1200 mg p.o. twice daily, flutter valve every 4 hours.  Continue DuoNeb nebulizers to scheduled every 6 hours.  SARS-CoV-2 RT-PCR is negative.  He appears mildly fluid overload, will give 1 dose of Lasix 40 mg IV. 2. Dysphagia-patient started on dysphagia 1 diet as per speech therapy. 3. ?  Delirium/anxiety-patient was started on Klonopin 0.5 mg p.o. twice daily however he became confused after 1 dose.  Klonopin was discontinued.  As per family patient has been getting confused intermittently.  At this time patient is not confused he is alert, oriented x3, intact insight and judgment.    MRI of the brain showed no mets.  Previous attending discussed with ENT Dr. Marcelline Deist he says that only treatment is surgery and that also if the fungal ball is eroding the bone was patient having visual changes.  It would not cause mental status changes.  Since patient is asymptomatic, he can follow-up with ENT as outpatient if symptoms arise. 4. Multifocal pneumonia-concern for aspiration, swallow evaluation obtained, patient started on dysphagia 1  diet.  Continue antibiotics as above.  WBC is down to 9000.  Will obtain CT chest without contrast today.  He does have history of bilateral pleural effusions, I have consulted pulmonology to assist with further management .  We will follow CT chest without contrast. 5. History of prostate cancer with lung metastasis-patient followed with oncology as outpatient.  He has been on palliative chemotherapy. 6. Moderate protein calorie malnutrition-dietitian consulted 7. D-dimer elevation-D-dimer is elevated at 4.50, CTA chest was negative for PE, lower extremity venous duplex was negative for DVT. 8 goals of care patient's family have decided to take him home with hospice as soon as possible.  Will discuss with case management.   Estimated body mass index is 22.96 kg/m as calculated from the following:   Height as of this encounter: 5\' 5"  (1.651 m).   Weight as of this encounter: 62.6 kg.  DVT prophylaxis: Heparin  Code Status: DNR  Family Communication:  Discussed with his daughter-in-law in the room with 3 other family members online on the phone.   Consultants: Oncology  Procedures:  Dispo: The patient is from: Home              Anticipated d/c is to: SNF              Anticipated d/c date is: > 3 days              Patient currently is not medically stable to d/c.   Difficult to place patient no  Subjective: He is resting in  bed he moves all his extremities he keeps repeating the same sentence he said he slept well later when I asked him to do move your bowels he kept saying I slept well I slept well Objective: Vitals:   07/18/20 0643 07/18/20 1626 07/18/20 2054 07/19/20 0546  BP: (!) 145/92 (!) 141/76 137/85 (!) 150/85  Pulse: (!) 108 (!) 101 98 100  Resp: 17  18 18   Temp: 99 F (37.2 C) 98.9 F (37.2 C) 98.3 F (36.8 C) 98 F (36.7 C)  TempSrc: Oral Oral Oral Oral  SpO2: 97% 97% 98% 99%  Weight:      Height:        Intake/Output Summary (Last 24 hours) at  07/19/2020 1412 Last data filed at 07/19/2020 0553 Gross per 24 hour  Intake --  Output 400 ml  Net -400 ml   Filed Weights   07/12/20 0951  Weight: 62.6 kg    Examination:  General exam: Appears calm and comfortable  Respiratory system rhonchi to auscultation. Respiratory effort normal. Cardiovascular system: S1 & S2 heard, RRR. No JVD, murmurs, rubs, gallops or clicks. No pedal edema. Gastrointestinal system: Abdomen is nondistended, soft and nontender. No organomegaly or masses felt. Normal bowel sounds heard. Central nervous system: Alert and oriented. No focal neurological deficits. Extremities: Symmetric 5 x 5 power. Skin: No rashes, lesions or ulcers Psychiatry: Judgement and insight appear normal. Mood & affect appropriate.     Data Reviewed: I have personally reviewed following labs and imaging studies  CBC: Recent Labs  Lab 07/13/20 1100 07/14/20 0500 07/16/20 0527  WBC 10.1 9.3 11.6*  NEUTROABS 8.1*  --   --   HGB 9.8* 9.5* 10.2*  HCT 34.4* 34.1* 36.8*  MCV 90.1 90.9 90.6  PLT 341 327 0000000   Basic Metabolic Panel: Recent Labs  Lab 07/13/20 1100 07/14/20 0500 07/16/20 0527 07/18/20 0541  NA 143 143 145 145  K 4.2 4.1 3.9 3.7  CL 102 102 102 99  CO2 30 32 33* 36*  GLUCOSE 94 96 111* 122*  BUN 13 12 18 19   CREATININE 0.64 0.62 0.62 0.59*  CALCIUM 8.2* 8.0* 8.2* 8.9   GFR: Estimated Creatinine Clearance: 70.5 mL/min (A) (by C-G formula based on SCr of 0.59 mg/dL (L)). Liver Function Tests: Recent Labs  Lab 07/13/20 1100 07/14/20 0500  AST 20 18  ALT 8 8  ALKPHOS 68 63  BILITOT 0.5 0.4  PROT 6.1* 6.2*  ALBUMIN 2.6* 2.6*   No results for input(s): LIPASE, AMYLASE in the last 168 hours. No results for input(s): AMMONIA in the last 168 hours. Coagulation Profile: Recent Labs  Lab 07/13/20 1100  INR 1.0   Cardiac Enzymes: No results for input(s): CKTOTAL, CKMB, CKMBINDEX, TROPONINI in the last 168 hours. BNP (last 3 results) Recent Labs     03/06/20 1504  PROBNP 78.0   HbA1C: No results for input(s): HGBA1C in the last 72 hours. CBG: No results for input(s): GLUCAP in the last 168 hours. Lipid Profile: No results for input(s): CHOL, HDL, LDLCALC, TRIG, CHOLHDL, LDLDIRECT in the last 72 hours. Thyroid Function Tests: No results for input(s): TSH, T4TOTAL, FREET4, T3FREE, THYROIDAB in the last 72 hours. Anemia Panel: No results for input(s): VITAMINB12, FOLATE, FERRITIN, TIBC, IRON, RETICCTPCT in the last 72 hours. Sepsis Labs: Recent Labs  Lab 07/13/20 1100  PROCALCITON <0.10    Recent Results (from the past 240 hour(s))  SARS CORONAVIRUS 2 (TAT 6-24 HRS) Nasopharyngeal Nasopharyngeal Swab  Status: None   Collection Time: 07/12/20  5:23 AM   Specimen: Nasopharyngeal Swab  Result Value Ref Range Status   SARS Coronavirus 2 NEGATIVE NEGATIVE Final    Comment: (NOTE) SARS-CoV-2 target nucleic acids are NOT DETECTED.  The SARS-CoV-2 RNA is generally detectable in upper and lower respiratory specimens during the acute phase of infection. Negative results do not preclude SARS-CoV-2 infection, do not rule out co-infections with other pathogens, and should not be used as the sole basis for treatment or other patient management decisions. Negative results must be combined with clinical observations, patient history, and epidemiological information. The expected result is Negative.  Fact Sheet for Patients: SugarRoll.be  Fact Sheet for Healthcare Providers: https://www.[facility] [place].com/  This test is not yet approved or cleared by the [place] FDA and  has been authorized for detection and/or diagnosis of SARS-CoV-2 by FDA under an Emergency Use Authorization (EUA). This EUA will remain  in effect (meaning this test can be used) for the duration of the COVID-19 declaration under Se ction 564(b)(1) of the Act, 21 U.S.C. section 360bbb-3(b)(1), unless the  authorization is terminated or revoked sooner.  Performed at [facility], [place] [address] [place], [place] [address]   MRSA PCR Screening     Status: None   Collection Time: 07/12/20  9:43 AM   Specimen: Nasopharyngeal  Result Value Ref Range Status   MRSA by PCR NEGATIVE NEGATIVE Final    Comment:        The GeneXpert MRSA Assay (FDA approved for NASAL specimens only), is one component of a comprehensive MRSA colonization surveillance program. It is not intended to diagnose MRSA infection nor to guide or monitor treatment for MRSA infections. Performed at [facility], [place] [address], [place], [place] [address]   Culture, blood (routine x 2)     Status: None   Collection Time: 07/12/20 11:45 AM   Specimen: BLOOD  Result Value Ref Range Status   Specimen Description   Final    BLOOD RIGHT ANTECUBITAL Performed at [facility] [address], [place], [place] [address]    Special Requests   Final    BOTTLES DRAWN AEROBIC AND ANAEROBIC Blood Culture adequate volume Performed at [facility] [address] [place], [place] [address]    Culture   Final    NO GROWTH 5 DAYS Performed at [facility], [place] [address] [place], [place] [address]    Report Status 07/17/2020 FINAL  Final  Culture, blood (routine x 2)     Status: None   Collection Time: 07/12/20 11:45 AM   Specimen: BLOOD  Result Value Ref Range Status   Specimen Description   Final    BLOOD BLOOD RIGHT FOREARM Performed at [facility] [address], [place], [place] [address]    Special Requests   Final    BOTTLES DRAWN AEROBIC AND ANAEROBIC Blood Culture adequate volume Performed at [facility] [address], [place], [place] [address]    Culture   Final    NO GROWTH 5 DAYS Performed at [facility], [place] [address] [place], [place] [address]    Report Status 07/17/2020 FINAL  Final          Radiology Studies: No results found.      Scheduled Meds: . Chlorhexidine Gluconate Cloth  6 each Topical Daily  . feeding supplement  237 mL Oral BID  . guaiFENesin  15 mL Oral Q4H  . heparin  5,000 Units Subcutaneous Q8H   Continuous Infusions:    LOS: 7 days   Georgette Shell, MD 07/19/2020, 2:12 PM

## 2020-07-19 NOTE — TOC Initial Note (Signed)
Transition of Care [facility]) - Initial/Assessment Note    Patient Details  Name: [name] Mathews MRN: [id number] Date of Birth: January 16, 1946  Transition of [facility]) CM/SW Contact:    [name] Catalan, RN Phone Number: 07/19/2020, 3:08 PM  Clinical Narrative:                 TOC alerted that pt and family want pt home with hospice. Choice offered and [facility] chosen. [facility] liaison contacted for referral. Yellow DNR on chart for dc. Family want to take pt home via private vehicle. Pt already has home 02. Family requesting hospital bed, BSC, Nebulizer, Shower chair, Wheelchair, Shower chair, and suction machine. They are also requesting foley to be left in place at dc. MD made aware. Pt to go to the [place] [place] [address].      Expected Discharge Plan and Services           Expected Discharge Date: 07/19/20                       Activities of Daily Living Home Assistive Devices/Equipment: Oxygen,Walker (specify type) (front wheeled walker) ADL Screening (condition at time of admission) Patient's cognitive ability adequate to safely complete daily activities?: Yes Is the patient deaf or have difficulty hearing?: No Does the patient have difficulty seeing, even when wearing glasses/contacts?: No Does the patient have difficulty concentrating, remembering, or making decisions?: No Patient able to express need for assistance with ADLs?: Yes Does the patient have difficulty dressing or bathing?: Yes Independently performs ADLs?: No Communication: Independent Dressing (OT): Independent Grooming: Independent Feeding: Independent Bathing: Needs assistance Is this a change from baseline?: Pre-admission baseline Toileting: Needs assistance Is this a change from baseline?: Pre-admission baseline In/Out Bed: Needs assistance Is this a change from baseline?: Pre-admission baseline Walks in Home: Needs assistance Is this a change from  baseline?: Pre-admission baseline Does the patient have difficulty walking or climbing stairs?: Yes (secondary to weakness and shortness of breath) Weakness of Legs: Both Weakness of Arms/Hands: None    Admission diagnosis:  Shortness of breath [R06.02] Dyspnea [R06.00] Patient Active Problem List   Diagnosis Date Noted  . Dyspnea 07/12/2020  . Syncope 05/25/2020  . Acute on chronic respiratory failure with hypoxia ([place]) 03/07/2020  . 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 Multifocal pneumonia 02/28/2020  . Pain of left calf 02/28/2020  . Chronic respiratory failure with hypoxia ([place]) 02/28/2020  . Prostate cancer metastatic to lung ([place]) 02/12/2020  . Loss of appetite 02/12/2020  . Obstructive sleep apnea 09/18/2019  . Seasonal allergies 09/18/2019  . Port-A-Cath in place 03/24/2019  . Goals of care, counseling/discussion 01/05/2019  . History of cluster headache 08/12/2017  . Neuropathic pain 02/09/2017  . Routine adult health maintenance 01/08/2017  . Essential hypertension 01/08/2017  . Hyperlipidemia 01/08/2017  . Cervical lymphadenopathy 01/08/2017  . Back pain 09/08/2014  . Recurrent prostate adenocarcinoma 10/31/2011   PCP:  Shary Key, DO Pharmacy:   [facility], [place] [facility] [address] [place] [address] Phone: [phone] Fax: [phone]     Social Determinants of Health (SDOH) Interventions    Readmission Risk Interventions No flowsheet data found.

## 2020-07-19 NOTE — Progress Notes (Signed)
Patient discharged home with family, discharge instructions reviewed with patients daughter who verbalized understanding. Handout on Dysphagia 1 diet provided to daughter and questions answered.

## 2020-07-20 ENCOUNTER — Telehealth: Payer: Self-pay

## 2020-07-20 NOTE — Telephone Encounter (Signed)
-----   Message from [name], MD sent at 07/20/2020  3:52 PM EST ----- I will call him on Monday. Thanks ----- Message ----- From: Tami Lin, RN Sent: 07/20/2020   3:50 PM EST To: [name], MD  Patient wants to know if you can call him or if he can set up an appointment to talk to you because he said he wasn't coherent when hospice and his prognosis was discussed in the hospital. Lanelle Bal ----- Message ----- From: [name], MD Sent: 07/20/2020   3:41 PM EST To: Tami Lin, RN  I recommended hospice and discontinuation of any cancer treatment.  Thanks ----- Message ----- From: Tami Lin, RN Sent: 07/20/2020   3:30 PM EST To: [name], MD  Patient called and per the discharge note he was sent home with hospice. Patient and his daughter want to know what your recommendation is as far as hospice or continuing treatment. He wants to know if you think he can tolerate continuing treatment or should go to hospice care. Lanelle Bal

## 2020-07-20 NOTE — Telephone Encounter (Signed)
Patient and his daughter made aware that Dr. Alen Blew will call the patient on Monday. Verbalized understanding.

## 2020-07-23 ENCOUNTER — Other Ambulatory Visit: Payer: Self-pay | Admitting: Oncology

## 2020-07-23 NOTE — Progress Notes (Signed)
I discussed results of his recent CT scan and PSA results via phone with the patient and his daughter.  Since his discharge she reports feeling slightly better with improvement mentation.  He has clear Disease progression and overall debilitation from his cancer at this time.  Based on these findings I recommended proceeding with supportive care only and hospice enrollment.  He will no longer receive anticancer treatment.  We discussed today also his prognosis which she understands is guarded with limited life expectancy of likely less than 6 months.  He is already no CODE BLUE upon hospitalization and is considering hospice at this time.  All his questions and his daughter's questions were answered to their satisfaction.  The focus of care moving forward would be palliative.

## 2020-07-24 ENCOUNTER — Telehealth: Payer: Self-pay | Admitting: *Deleted

## 2020-07-24 NOTE — Telephone Encounter (Signed)
Received on call record from Hospice/[facility] asking if Dr Alen Blew would be attending for pt's care.  Informed that he would be attending via vm to [place] [phone].

## 2020-08-21 DEATH — deceased
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
